# Patient Record
Sex: Female | Born: 1945
Health system: Southern US, Community
[De-identification: ages and names within clinical notes are randomized; demographics above are authoritative.]

## PROBLEM LIST (undated history)

## (undated) DIAGNOSIS — M810 Age-related osteoporosis without current pathological fracture: Secondary | ICD-10-CM

## (undated) DIAGNOSIS — G20 Parkinson's disease: Secondary | ICD-10-CM

## (undated) DIAGNOSIS — K254 Chronic or unspecified gastric ulcer with hemorrhage: Secondary | ICD-10-CM

## (undated) DIAGNOSIS — E559 Vitamin D deficiency, unspecified: Secondary | ICD-10-CM

## (undated) DIAGNOSIS — R259 Unspecified abnormal involuntary movements: Secondary | ICD-10-CM

## (undated) DIAGNOSIS — IMO0001 Reserved for inherently not codable concepts without codable children: Secondary | ICD-10-CM

## (undated) DIAGNOSIS — R9430 Abnormal result of cardiovascular function study, unspecified: Secondary | ICD-10-CM

## (undated) DIAGNOSIS — I2510 Atherosclerotic heart disease of native coronary artery without angina pectoris: Secondary | ICD-10-CM

## (undated) DIAGNOSIS — I5189 Other ill-defined heart diseases: Secondary | ICD-10-CM

## (undated) DIAGNOSIS — F03A0 Unspecified dementia, mild, without behavioral disturbance, psychotic disturbance, mood disturbance, and anxiety: Secondary | ICD-10-CM

## (undated) DIAGNOSIS — E041 Nontoxic single thyroid nodule: Secondary | ICD-10-CM

## (undated) DIAGNOSIS — G250 Essential tremor: Secondary | ICD-10-CM

## (undated) DIAGNOSIS — J189 Pneumonia, unspecified organism: Secondary | ICD-10-CM

## (undated) DIAGNOSIS — J329 Chronic sinusitis, unspecified: Secondary | ICD-10-CM

## (undated) DIAGNOSIS — K219 Gastro-esophageal reflux disease without esophagitis: Secondary | ICD-10-CM

## (undated) DIAGNOSIS — K449 Diaphragmatic hernia without obstruction or gangrene: Secondary | ICD-10-CM

## (undated) DIAGNOSIS — J339 Nasal polyp, unspecified: Secondary | ICD-10-CM

## (undated) DIAGNOSIS — M1990 Unspecified osteoarthritis, unspecified site: Secondary | ICD-10-CM

## (undated) DIAGNOSIS — E538 Deficiency of other specified B group vitamins: Secondary | ICD-10-CM

## (undated) DIAGNOSIS — R29818 Other symptoms and signs involving the nervous system: Secondary | ICD-10-CM

## (undated) DIAGNOSIS — I313 Pericardial effusion (noninflammatory): Secondary | ICD-10-CM

## (undated) DIAGNOSIS — J45909 Unspecified asthma, uncomplicated: Secondary | ICD-10-CM

## (undated) DIAGNOSIS — R112 Nausea with vomiting, unspecified: Secondary | ICD-10-CM

## (undated) DIAGNOSIS — Z9889 Other specified postprocedural states: Secondary | ICD-10-CM

## (undated) DIAGNOSIS — Z87442 Personal history of urinary calculi: Secondary | ICD-10-CM

## (undated) DIAGNOSIS — I3139 Other pericardial effusion (noninflammatory): Secondary | ICD-10-CM

## (undated) DIAGNOSIS — M8580 Other specified disorders of bone density and structure, unspecified site: Secondary | ICD-10-CM

## (undated) DIAGNOSIS — S3210XA Unspecified fracture of sacrum, initial encounter for closed fracture: Secondary | ICD-10-CM

## (undated) DIAGNOSIS — F32A Depression, unspecified: Secondary | ICD-10-CM

## (undated) DIAGNOSIS — E785 Hyperlipidemia, unspecified: Secondary | ICD-10-CM

## (undated) DIAGNOSIS — I10 Essential (primary) hypertension: Secondary | ICD-10-CM

## (undated) DIAGNOSIS — S322XXA Fracture of coccyx, initial encounter for closed fracture: Secondary | ICD-10-CM

## (undated) DIAGNOSIS — R001 Bradycardia, unspecified: Secondary | ICD-10-CM

## (undated) DIAGNOSIS — R002 Palpitations: Secondary | ICD-10-CM

## (undated) DIAGNOSIS — G4700 Insomnia, unspecified: Secondary | ICD-10-CM

## (undated) DIAGNOSIS — M545 Low back pain, unspecified: Secondary | ICD-10-CM

## (undated) DIAGNOSIS — F419 Anxiety disorder, unspecified: Secondary | ICD-10-CM

## (undated) DIAGNOSIS — F0390 Unspecified dementia without behavioral disturbance: Secondary | ICD-10-CM

## (undated) HISTORY — DX: Low back pain, unspecified: M54.50

## (undated) HISTORY — DX: Unspecified fracture of sacrum, initial encounter for closed fracture: S32.10XA

## (undated) HISTORY — DX: Palpitations: R00.2

## (undated) HISTORY — DX: Other specified disorders of bone density and structure, unspecified site: M85.80

## (undated) HISTORY — PX: ESOPHAGEAL DILATION: SHX303

## (undated) HISTORY — DX: Fracture of coccyx, initial encounter for closed fracture: S32.2XXA

## (undated) HISTORY — DX: Reserved for inherently not codable concepts without codable children: IMO0001

## (undated) HISTORY — DX: Unspecified dementia, mild, without behavioral disturbance, psychotic disturbance, mood disturbance, and anxiety: F03.A0

## (undated) HISTORY — DX: Chronic sinusitis, unspecified: J32.9

## (undated) HISTORY — PX: LUMBAR FUSION: SHX111

## (undated) HISTORY — DX: Parkinson's disease: G20

## (undated) HISTORY — DX: Pericardial effusion (noninflammatory): I31.3

## (undated) HISTORY — PX: EYE SURGERY: SHX253

## (undated) HISTORY — DX: Nasal polyp, unspecified: J33.9

## (undated) HISTORY — DX: Other pericardial effusion (noninflammatory): I31.39

## (undated) HISTORY — PX: CATARACT EXTRACTION W/ INTRAOCULAR LENS  IMPLANT, BILATERAL: SHX1307

## (undated) HISTORY — DX: Abnormal result of cardiovascular function study, unspecified: R94.30

## (undated) HISTORY — PX: TONSILLECTOMY: SUR1361

## (undated) HISTORY — DX: Low back pain: M54.5

## (undated) HISTORY — DX: Unspecified osteoarthritis, unspecified site: M19.90

## (undated) HISTORY — DX: Age-related osteoporosis without current pathological fracture: M81.0

## (undated) HISTORY — DX: Unspecified dementia without behavioral disturbance: F03.90

---

## 1974-12-12 HISTORY — PX: THYROIDECTOMY, PARTIAL: SHX18

## 1975-12-13 HISTORY — PX: TUBAL LIGATION: SHX77

## 1976-12-12 DIAGNOSIS — C539 Malignant neoplasm of cervix uteri, unspecified: Secondary | ICD-10-CM

## 1976-12-12 HISTORY — DX: Malignant neoplasm of cervix uteri, unspecified: C53.9

## 1976-12-12 HISTORY — PX: VAGINAL HYSTERECTOMY: SUR661

## 2000-10-27 ENCOUNTER — Encounter: Admission: RE | Admit: 2000-10-27 | Discharge: 2000-10-27 | Payer: Self-pay | Admitting: Family Medicine

## 2000-10-27 ENCOUNTER — Encounter: Payer: Self-pay | Admitting: Family Medicine

## 2001-12-12 HISTORY — PX: BLADDER SUSPENSION: SHX72

## 2001-12-17 ENCOUNTER — Encounter: Payer: Self-pay | Admitting: Family Medicine

## 2001-12-17 ENCOUNTER — Encounter: Admission: RE | Admit: 2001-12-17 | Discharge: 2001-12-17 | Payer: Self-pay | Admitting: Family Medicine

## 2001-12-26 ENCOUNTER — Observation Stay (HOSPITAL_COMMUNITY): Admission: RE | Admit: 2001-12-26 | Discharge: 2001-12-27 | Payer: Self-pay | Admitting: Urology

## 2002-08-06 ENCOUNTER — Encounter: Admission: RE | Admit: 2002-08-06 | Discharge: 2002-08-06 | Payer: Self-pay | Admitting: Family Medicine

## 2002-08-06 ENCOUNTER — Encounter: Payer: Self-pay | Admitting: Family Medicine

## 2003-05-29 ENCOUNTER — Encounter: Admission: RE | Admit: 2003-05-29 | Discharge: 2003-05-29 | Payer: Self-pay | Admitting: Family Medicine

## 2003-05-29 ENCOUNTER — Encounter: Payer: Self-pay | Admitting: Family Medicine

## 2005-05-16 ENCOUNTER — Encounter: Admission: RE | Admit: 2005-05-16 | Discharge: 2005-05-16 | Payer: Self-pay | Admitting: Family Medicine

## 2005-07-01 ENCOUNTER — Ambulatory Visit: Payer: Self-pay | Admitting: Cardiology

## 2005-07-12 ENCOUNTER — Ambulatory Visit: Payer: Self-pay

## 2005-07-12 ENCOUNTER — Ambulatory Visit: Payer: Self-pay | Admitting: Cardiology

## 2005-07-18 ENCOUNTER — Ambulatory Visit: Payer: Self-pay | Admitting: Cardiology

## 2006-07-28 ENCOUNTER — Encounter: Admission: RE | Admit: 2006-07-28 | Discharge: 2006-07-28 | Payer: Self-pay | Admitting: Family Medicine

## 2006-09-28 ENCOUNTER — Ambulatory Visit (HOSPITAL_COMMUNITY): Admission: RE | Admit: 2006-09-28 | Discharge: 2006-09-29 | Payer: Self-pay | Admitting: Orthopedic Surgery

## 2007-12-13 HISTORY — PX: OTHER SURGICAL HISTORY: SHX169

## 2009-08-06 ENCOUNTER — Ambulatory Visit: Payer: Self-pay | Admitting: Family Medicine

## 2010-04-28 ENCOUNTER — Other Ambulatory Visit: Payer: Self-pay | Admitting: General Practice

## 2010-04-30 ENCOUNTER — Ambulatory Visit: Payer: Self-pay | Admitting: General Practice

## 2010-07-21 ENCOUNTER — Encounter: Admission: RE | Admit: 2010-07-21 | Discharge: 2010-07-21 | Payer: Self-pay | Admitting: Neurosurgery

## 2010-08-12 HISTORY — PX: BACK SURGERY: SHX140

## 2010-09-07 ENCOUNTER — Inpatient Hospital Stay (HOSPITAL_COMMUNITY): Admission: RE | Admit: 2010-09-07 | Discharge: 2010-09-10 | Payer: Self-pay | Admitting: Neurosurgery

## 2010-12-12 HISTORY — PX: SHOULDER ARTHROSCOPY: SHX128

## 2011-02-24 LAB — COMPREHENSIVE METABOLIC PANEL
ALT: 20 U/L (ref 0–35)
AST: 26 U/L (ref 0–37)
Albumin: 3.7 g/dL (ref 3.5–5.2)
Alkaline Phosphatase: 114 U/L (ref 39–117)
BUN: 16 mg/dL (ref 6–23)
CO2: 30 mEq/L (ref 19–32)
Calcium: 10 mg/dL (ref 8.4–10.5)
Chloride: 105 mEq/L (ref 96–112)
Creatinine, Ser: 1.18 mg/dL (ref 0.4–1.2)
GFR calc Af Amer: 56 mL/min — ABNORMAL LOW (ref >60)
GFR calc non Af Amer: 46 mL/min — ABNORMAL LOW (ref >60)
Glucose, Bld: 102 mg/dL — ABNORMAL HIGH (ref 70–99)
Potassium: 4.7 mEq/L (ref 3.5–5.1)
Sodium: 141 mEq/L (ref 135–145)
Total Bilirubin: 0.4 mg/dL (ref 0.3–1.2)
Total Protein: 6.1 g/dL (ref 6.0–8.3)

## 2011-02-24 LAB — CBC
HCT: 42.1 % (ref 36.0–46.0)
Hemoglobin: 13.8 g/dL (ref 12.0–15.0)
MCH: 29.4 pg (ref 26.0–34.0)
MCHC: 32.8 g/dL (ref 30.0–36.0)
MCV: 89.6 fL (ref 78.0–100.0)
Platelets: 321 10*3/uL (ref 150–400)
RBC: 4.7 MIL/uL (ref 3.87–5.11)
RDW: 13.6 % (ref 11.5–15.5)
WBC: 11 10*3/uL — ABNORMAL HIGH (ref 4.0–10.5)

## 2011-02-24 LAB — URINALYSIS, ROUTINE W REFLEX MICROSCOPIC
Bilirubin Urine: NEGATIVE
Glucose, UA: NEGATIVE mg/dL
Hgb urine dipstick: NEGATIVE
Ketones, ur: NEGATIVE mg/dL
Nitrite: POSITIVE — AB
Protein, ur: NEGATIVE mg/dL
Specific Gravity, Urine: 1.023 (ref 1.005–1.030)
Urobilinogen, UA: 0.2 mg/dL (ref 0.0–1.0)
pH: 6 (ref 5.0–8.0)

## 2011-02-24 LAB — APTT: aPTT: 29 seconds (ref 24–37)

## 2011-02-24 LAB — DIFFERENTIAL
Basophils Absolute: 0.1 10*3/uL (ref 0.0–0.1)
Basophils Relative: 1 % (ref 0–1)
Eosinophils Absolute: 0.3 10*3/uL (ref 0.0–0.7)
Eosinophils Relative: 3 % (ref 0–5)
Lymphocytes Relative: 28 % (ref 12–46)
Lymphs Abs: 3.1 10*3/uL (ref 0.7–4.0)
Monocytes Absolute: 1.1 10*3/uL — ABNORMAL HIGH (ref 0.1–1.0)
Monocytes Relative: 10 % (ref 3–12)
Neutro Abs: 6.5 10*3/uL (ref 1.7–7.7)
Neutrophils Relative %: 59 % (ref 43–77)

## 2011-02-24 LAB — URINE MICROSCOPIC-ADD ON

## 2011-02-24 LAB — TYPE AND SCREEN
ABO/RH(D): O NEG
Antibody Screen: NEGATIVE

## 2011-02-24 LAB — PROTIME-INR
INR: 0.94 (ref 0.00–1.49)
Prothrombin Time: 12.8 seconds (ref 11.6–15.2)

## 2011-02-24 LAB — SURGICAL PCR SCREEN
MRSA, PCR: NEGATIVE
Staphylococcus aureus: NEGATIVE

## 2011-02-24 LAB — ABO/RH: ABO/RH(D): O NEG

## 2011-04-29 NOTE — Op Note (Signed)
[facility]  Patient:    Nancy Blackburn, Nancy Blackburn Visit Number: [id number] MRN: [id number]          Service Type: SUR Location: 1S 0002 01 Attending Physician:  Thermon Leyland Dictated by:   Barron Alvine, M.D. Proc. Date: 12/26/01 Admit Date:  12/26/2001   CC:         Nancy Blackburn, M.D., [facility], [place], [place]   Operative Report  PREOPERATIVE DIAGNOSES: 1. Grade 2 cystocele. 2. Stress urinary incontinence.  POSTOPERATIVE DIAGNOSES: 1. Grade 2 cystocele. 2. Stress urinary incontinence.  PROCEDURE: 1. Anterior repair. 2. Pubovaginal sling. 3. Flexible cystoscopy. 4. Suprapubic tube placement.  SURGEON:  Barron Alvine, M.D.  ANESTHESIA:  General.  INDICATIONS:  Nancy Blackburn is a 65 year old female. She has had a fairly long-standing and progressively worsening urinary incontinence. Her leakage is essentially that of a stress-based incontinence. She has some leakage without awareness but primarily leakage with stress events. She wears several thin pads per day. Objectively, she was noted to have a mild to moderate cystocele with urethral and bladder neck hypermobility and obvious objective incontinence. Her Nancy Blackburn test was positive. The patient underwent counseling with regard to treatment options and elected to have surgical repair. She appears to understand the risks and benefits as well as the success rates and typical limitations of the surgery. We talked about the possibility of prolonged urinary retention or other possibilities of voiding dysfunction as well as bleeding, infection, urinary tract injury, etc.  TECHNIQUE AND FINDINGS:  The patient was brought to the operating room where she had successful induction of general anesthesia. She was placed in mid lithotomy position and prepped and draped in the usual manner. A Foley catheter was inserted and the bladder was drained. A weighted vaginal speculum was utilized. The anterior  vaginal mucosa was infiltrated and an incision was made from the mid urethra back to near the scar of the area of the vaginal cuff. Vaginal incision was deepened down to the level of the bladder. The plane separating the vaginal wall and bladder were easily established. The retropubic spaces were entered fairly easily as well. Once the bladder was completely dissected free, a standard anterior repair was performed with approximately four horizontal mattress sutures of 2-0 Vicryl. This reduced the cystocele nicely. We then made a suprapubic incision which was carried down to the level of the anterior fascia. The sling itself was made with a piece of fascia lata Tutoplast, which was cut to approximately 2.5 x 8 cm. It was anchored at both ends with #1 nylon suture. With a finger in the right side of the retropubic space, we were able to pass a clamp behind the pubis symphysis and out the right side of the vaginal incision. The sutures were then grabbed and brought out the suprapubic incision. The same thing was done on the left side. The sling was then positioned at the proximal to mid urethra and pexed open with some 3-0 Vicryl suture. Cystoscopy revealed no evidence of bladder injury with blue dye seen from both ureteral orifices and a suprapubic tube was placed with direct visual guidance. The sling appeared to be well positioned. We then trimmed some anterior vaginal mucosa and closed the vaginal incision with a running 2-0 Vicryl suture. Vaginal packing was utilized. The suprapubic wound was infiltrated with Marcaine, and irrigated with antibiotic solution. The suprapubic sutures were then closed over approximately two fingers. Subcutaneous tissues were closed with some interrupted Vicryl and the skin was closed with clips. The  suprapubic tube was left to gravity drainage. Blood loss was about 200 cc. The patient appeared to tolerate the procedure well and there were no obvious  complications. Dictated by:   Barron Alvine, M.D. Attending Physician:  Thermon Leyland DD:  12/26/01 TD:  12/26/01 Job: [id number]

## 2011-04-29 NOTE — Op Note (Signed)
NAME:  Nancy Blackburn, Nancy Blackburn          ACCOUNT NO.:  [id number]   MEDICAL RECORD NO.:  [id number]          PATIENT TYPE:  AMB   LOCATION:  DAY                          FACILITY:  [facility]   PHYSICIAN:  Marlowe Kays, M.D.  DATE OF BIRTH:  07-Feb-1946   DATE OF PROCEDURE:  09/28/2006  DATE OF DISCHARGE:                                 OPERATIVE REPORT   PREOPERATIVE DIAGNOSIS:  1. Adhesive capsulitis.  2. Chronic impingement syndrome with rotator cuff tendinopathy.  3. Degenerative arthritis acromioclavicular joint,  right shoulder.   POSTOPERATIVE DIAGNOSIS:  1. Adhesive capsulitis.  2. Chronic impingement syndrome with rotator cuff tendinopathy.  3. Degenerative arthritis acromioclavicular joint,  right shoulder.   OPERATION:  1. Closed manipulation right shoulder.  2. Right shoulder arthroscopy (normal glenohumeral examination) and      arthroscopic subacromial decompression.  3. Open distal clavicle resection, right shoulder.   SURGEON:  Marlowe Kays, M.D.   ASSISTANTDruscilla Brownie. Underwood, P.A.-C.   ANESTHESIA:  General.   JUSTIFICATION FOR PROCEDURE:  She has had a painful right shoulder going  back to around June of this year. She also has a history adhesive capsulitis  in the left shoulder which was responded well to physical therapy. She was  not having degrees of motion and on x-ray, moderate degenerative arthritis  of the [facility] joint and a type 2 acromion with an MRI demonstrating rotator cuff  tendinopathy without any tear, what was described as a bulky AC arthritis.  Accordingly, she is here for the above mentioned surgical procedure.   DESCRIPTION OF PROCEDURE:  Prophylactic antibiotics, satisfactory general  anesthesia, beach chair position on the [place] frame, the right shoulder  girdle was prepped with DuraPrep and draped in a sterile field.  Prior to  the prep, however, I did perform a closed manipulation of her right  shoulder. Range of motion was markedly  improved just by the general  anesthetic indicating that pain was a factor in her loss of motion. I did  restore normal motion to her shoulder.  Following the prep, I injected the  proposed lateral and posterior portals as well as subacromial space and the  desired incision for the distal clavicle resection, all with 0.5% Marcaine  with adrenaline. Through a posterior soft spot portal, I atraumatically  entered the glenohumeral joint. There was some synovitis present and a good  bit of erythema throughout the joint consistent with either inflammatory or  closed manipulation changes.  However, I found no arthroscopically treatable  abnormalities with minimal perhaps injury to the under surface of the  rotator cuff.  I then redirected the scope in the subacromial space.  She  had a significant bursitis and impingement problem which I documented with  pictures.  I then cleaned up to the bursitis with a 4.2 shaver and then used  the 90 degrees ArthroCare vaporizer removing the synovium from the under  surface of the acromion and followed this with a 4-mm oval bur, burring down  the under surface of the acromion back to the [facility] joint.  I then alternated  back-and-forth between the bur, the vaporizer, and  the shaver until we had a  wide decompression which was documented, once again, with films with the arm  to her side and the arm abducted.  I then shaved the rotator cuff, checked  for bleeding, and removed all fluid possible from the subacromial space.  I  made an open incision down to the distal clavicle which I opened with  subperiosteal dissection using cutting cautery and periosteal elevator.  I  measured off 1.5 cm medial to the [facility] joint, placed some baby Homan's there,  and then used a microsaw to amputate the clavicle.  I removed the cut  portion with towel clip and cautery technique.  One small spicule of bone I  trimmed back to the parent clavicle and covered this with bone wax.  The   wound was then irrigated with sterile saline.  I placed Gelfoam to fill the  gap of the resection.  I then closed the wound over the top of it with  interrupted #1 Vicryl in the fascial covering, 2-0 Vicryl in the  subcutaneous tissue, and Steri-Strips on the skin, 4-0 nylon in the two  portals. Everything was very injected with 0.5% Marcaine with adrenaline.  Betadine and Adaptic were placed over the portals, a dry sterile dressing  over the wound, shoulder immobilizer.  She tolerated the procedure well and  was taken to the recovery room in satisfactory condition with no known  complications.           ______________________________  Marlowe Kays, M.D.     JA/MEDQ  D:  09/28/2006  T:  09/29/2006  Job:  [id number]

## 2011-08-05 ENCOUNTER — Ambulatory Visit: Payer: Self-pay | Admitting: Family Medicine

## 2011-09-08 ENCOUNTER — Other Ambulatory Visit: Payer: Self-pay | Admitting: Neurosurgery

## 2011-09-08 DIAGNOSIS — M549 Dorsalgia, unspecified: Secondary | ICD-10-CM

## 2011-09-23 ENCOUNTER — Ambulatory Visit
Admission: RE | Admit: 2011-09-23 | Discharge: 2011-09-23 | Disposition: A | Discharge status: Home / Self Care | Payer: Medicare Other | Source: Ambulatory Visit | Attending: Neurosurgery | Admitting: Neurosurgery

## 2011-09-23 DIAGNOSIS — M549 Dorsalgia, unspecified: Secondary | ICD-10-CM

## 2011-09-23 MED ORDER — GADOBENATE DIMEGLUMINE 529 MG/ML IV SOLN
17.0000 mL | Freq: Once | INTRAVENOUS | Status: AC | PRN
  Administered 2011-09-23: 17 mL via INTRAVENOUS

## 2011-09-29 ENCOUNTER — Other Ambulatory Visit: Payer: Self-pay | Admitting: Neurosurgery

## 2011-09-29 DIAGNOSIS — M545 Low back pain, unspecified: Secondary | ICD-10-CM

## 2011-10-04 ENCOUNTER — Ambulatory Visit
Admission: RE | Admit: 2011-10-04 | Discharge: 2011-10-04 | Disposition: A | Discharge status: Home / Self Care | Payer: Medicare Other | Source: Ambulatory Visit | Attending: Neurosurgery | Admitting: Neurosurgery

## 2011-10-04 DIAGNOSIS — M545 Low back pain, unspecified: Secondary | ICD-10-CM

## 2011-10-31 ENCOUNTER — Other Ambulatory Visit: Payer: Self-pay | Admitting: Neurosurgery

## 2011-11-11 ENCOUNTER — Encounter (HOSPITAL_COMMUNITY)
Admission: RE | Admit: 2011-11-11 | Discharge: 2011-11-11 | Disposition: A | Discharge status: Home / Self Care | Payer: Medicare Other | Source: Ambulatory Visit | Attending: Anesthesiology | Admitting: Anesthesiology

## 2011-11-11 ENCOUNTER — Other Ambulatory Visit: Payer: Self-pay

## 2011-11-11 ENCOUNTER — Encounter (HOSPITAL_COMMUNITY)
Admission: RE | Admit: 2011-11-11 | Discharge: 2011-11-11 | Disposition: A | Discharge status: Home / Self Care | Payer: Medicare Other | Source: Ambulatory Visit | Attending: Neurosurgery | Admitting: Neurosurgery

## 2011-11-11 ENCOUNTER — Encounter (HOSPITAL_COMMUNITY): Payer: Self-pay

## 2011-11-11 HISTORY — DX: Other specified postprocedural states: Z98.890

## 2011-11-11 HISTORY — DX: Nausea with vomiting, unspecified: R11.2

## 2011-11-11 HISTORY — DX: Essential (primary) hypertension: I10

## 2011-11-11 HISTORY — DX: Hyperlipidemia, unspecified: E78.5

## 2011-11-11 LAB — BASIC METABOLIC PANEL
BUN: 16 mg/dL (ref 6–23)
CO2: 32 mEq/L (ref 19–32)
Calcium: 10.2 mg/dL (ref 8.4–10.5)
Chloride: 104 mEq/L (ref 96–112)
Creatinine, Ser: 0.87 mg/dL (ref 0.50–1.10)
GFR calc Af Amer: 79 mL/min — ABNORMAL LOW (ref >90)
GFR calc non Af Amer: 68 mL/min — ABNORMAL LOW (ref >90)
Glucose, Bld: 107 mg/dL — ABNORMAL HIGH (ref 70–99)
Potassium: 3.9 mEq/L (ref 3.5–5.1)
Sodium: 142 mEq/L (ref 135–145)

## 2011-11-11 LAB — SURGICAL PCR SCREEN
MRSA, PCR: NEGATIVE
Staphylococcus aureus: POSITIVE — AB

## 2011-11-11 LAB — TYPE AND SCREEN
ABO/RH(D): O NEG
Antibody Screen: NEGATIVE

## 2011-11-11 LAB — CBC
HCT: 40.8 % (ref 36.0–46.0)
Hemoglobin: 14.1 g/dL (ref 12.0–15.0)
MCH: 30.1 pg (ref 26.0–34.0)
MCHC: 34.6 g/dL (ref 30.0–36.0)
MCV: 87 fL (ref 78.0–100.0)
Platelets: 246 10*3/uL (ref 150–400)
RBC: 4.69 MIL/uL (ref 3.87–5.11)
RDW: 12.5 % (ref 11.5–15.5)
WBC: 8.7 10*3/uL (ref 4.0–10.5)

## 2011-11-11 NOTE — Pre-Procedure Instructions (Signed)
20 Nancy Blackburn  11/11/2011   Your procedure is scheduled on:  Thurs,Dec 13 @ 3:45pm  Report to [facility] at 12:45 PM.  Call this number if you have problems the morning of surgery: [phone]   Remember:   Do not eat food:After Midnight.  May have clear liquids: up to 4 Hours before arrival.(until 8:45am)  Clear liquids include soda, tea, black coffee, apple or grape juice, broth.  Take these medicines the morning of surgery with A SIP OF WATER: Xanax,Pain Pill(if needed),Metoprolol   Do not wear jewelry, make-up or nail polish.  Do not wear lotions, powders, or perfumes. You may wear deodorant.  Do not shave 48 hours prior to surgery.  Do not bring valuables to the hospital.  Contacts, dentures or bridgework may not be worn into surgery.  Leave suitcase in the car. After surgery it may be brought to your room.  For patients admitted to the hospital, checkout time is 11:00 AM the day of discharge.   Patients discharged the day of surgery will not be allowed to drive home.  Name and phone number of your driver:   Special Instructions: CHG Shower Use Special Wash: 1/2 bottle night before surgery and 1/2 bottle morning of surgery.   Please read over the following fact sheets that you were given: Pain Booklet, Coughing and Deep Breathing, Blood Transfusion Information, MRSA Information and Surgical Site Infection Prevention

## 2011-11-11 NOTE — Progress Notes (Signed)
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

## 2011-11-11 NOTE — Progress Notes (Signed)
Pt doesn't have a cardiologist;managed by medical MD for HTN/Hyperlipidemia Dr.richard Sullivan Lone at [facility]

## 2011-11-23 MED ORDER — CEFAZOLIN SODIUM-DEXTROSE 2-3 GM-% IV SOLR
2.0000 g | INTRAVENOUS | Status: AC
  Administered 2011-11-24: 2 g via INTRAVENOUS
  Filled 2011-11-23: qty 50

## 2011-11-24 ENCOUNTER — Encounter (HOSPITAL_COMMUNITY): Payer: Self-pay | Admitting: Anesthesiology

## 2011-11-24 ENCOUNTER — Encounter (HOSPITAL_COMMUNITY)
Admission: RE | Disposition: A | Discharge status: Home / Self Care | Payer: Self-pay | Source: Ambulatory Visit | Attending: Neurosurgery

## 2011-11-24 ENCOUNTER — Encounter (HOSPITAL_COMMUNITY): Payer: Self-pay | Admitting: *Deleted

## 2011-11-24 ENCOUNTER — Inpatient Hospital Stay (HOSPITAL_COMMUNITY): Payer: Medicare Other | Admitting: Anesthesiology

## 2011-11-24 ENCOUNTER — Inpatient Hospital Stay (HOSPITAL_COMMUNITY)
Admission: RE | Admit: 2011-11-24 | Discharge: 2011-11-30 | DRG: 460 | Disposition: A | Discharge status: Home / Self Care | Payer: Medicare Other | Source: Ambulatory Visit | Attending: Neurosurgery | Admitting: Neurosurgery

## 2011-11-24 ENCOUNTER — Inpatient Hospital Stay (HOSPITAL_COMMUNITY): Payer: Medicare Other

## 2011-11-24 DIAGNOSIS — Z888 Allergy status to other drugs, medicaments and biological substances status: Secondary | ICD-10-CM

## 2011-11-24 DIAGNOSIS — M4316 Spondylolisthesis, lumbar region: Secondary | ICD-10-CM

## 2011-11-24 DIAGNOSIS — E785 Hyperlipidemia, unspecified: Secondary | ICD-10-CM | POA: Diagnosis present

## 2011-11-24 DIAGNOSIS — Z981 Arthrodesis status: Secondary | ICD-10-CM

## 2011-11-24 DIAGNOSIS — I10 Essential (primary) hypertension: Secondary | ICD-10-CM | POA: Diagnosis present

## 2011-11-24 DIAGNOSIS — Z79899 Other long term (current) drug therapy: Secondary | ICD-10-CM

## 2011-11-24 DIAGNOSIS — Z01812 Encounter for preprocedural laboratory examination: Secondary | ICD-10-CM

## 2011-11-24 DIAGNOSIS — Z01810 Encounter for preprocedural cardiovascular examination: Secondary | ICD-10-CM

## 2011-11-24 DIAGNOSIS — Z01818 Encounter for other preprocedural examination: Secondary | ICD-10-CM

## 2011-11-24 DIAGNOSIS — Q762 Congenital spondylolisthesis: Secondary | ICD-10-CM

## 2011-11-24 DIAGNOSIS — M48061 Spinal stenosis, lumbar region without neurogenic claudication: Principal | ICD-10-CM | POA: Diagnosis present

## 2011-11-24 DIAGNOSIS — IMO0002 Reserved for concepts with insufficient information to code with codable children: Secondary | ICD-10-CM | POA: Diagnosis present

## 2011-11-24 SURGERY — POSTERIOR LUMBAR FUSION 1 LEVEL
Anesthesia: General | Site: Back | Wound class: Clean

## 2011-11-24 MED ORDER — ONDANSETRON HCL 4 MG/2ML IJ SOLN: 4.0000 mg | INTRAMUSCULAR | Status: DC | PRN

## 2011-11-24 MED ORDER — BACITRACIN ZINC 500 UNIT/GM EX OINT
TOPICAL_OINTMENT | CUTANEOUS | Status: DC | PRN
  Administered 2011-11-24: 1 via TOPICAL

## 2011-11-24 MED ORDER — DIPHENHYDRAMINE HCL 25 MG PO TABS
25.0000 mg | ORAL_TABLET | Freq: Four times a day (QID) | ORAL | Status: DC | PRN
  Filled 2011-11-24: qty 1

## 2011-11-24 MED ORDER — SODIUM CHLORIDE 0.9 % IV SOLN
INTRAVENOUS | Status: AC
  Filled 2011-11-24: qty 500

## 2011-11-24 MED ORDER — HYDROCODONE-ACETAMINOPHEN 5-325 MG PO TABS
1.0000 | ORAL_TABLET | ORAL | Status: DC | PRN
  Administered 2011-11-24 – 2011-11-25 (×2): 2 via ORAL
  Filled 2011-11-24 (×2): qty 2

## 2011-11-24 MED ORDER — BUPIVACAINE LIPOSOME 1.3 % IJ SUSP
20.0000 mL | Freq: Once | INTRAMUSCULAR | Status: AC
  Administered 2011-11-24: 20 mL
  Filled 2011-11-24: qty 20

## 2011-11-24 MED ORDER — CEFAZOLIN SODIUM 1-5 GM-% IV SOLN
1.0000 g | Freq: Three times a day (TID) | INTRAVENOUS | Status: AC
  Administered 2011-11-24 – 2011-11-25 (×2): 1 g via INTRAVENOUS
  Filled 2011-11-24 (×2): qty 50

## 2011-11-24 MED ORDER — FENTANYL CITRATE 0.05 MG/ML IJ SOLN
INTRAMUSCULAR | Status: DC | PRN
  Administered 2011-11-24: 100 ug via INTRAVENOUS
  Administered 2011-11-24: 50 ug via INTRAVENOUS
  Administered 2011-11-24 (×2): 100 ug via INTRAVENOUS

## 2011-11-24 MED ORDER — HEMOSTATIC AGENTS (NO CHARGE) OPTIME
TOPICAL | Status: DC | PRN
  Administered 2011-11-24: 1 via TOPICAL

## 2011-11-24 MED ORDER — ACETAMINOPHEN 325 MG PO TABS
650.0000 mg | ORAL_TABLET | ORAL | Status: DC | PRN
  Administered 2011-11-26 – 2011-11-27 (×2): 650 mg via ORAL
  Filled 2011-11-24 (×2): qty 2

## 2011-11-24 MED ORDER — ALPRAZOLAM 0.5 MG PO TABS
0.5000 mg | ORAL_TABLET | Freq: Three times a day (TID) | ORAL | Status: DC
  Administered 2011-11-24 – 2011-11-29 (×15): 0.5 mg via ORAL
  Filled 2011-11-24 (×15): qty 1

## 2011-11-24 MED ORDER — ONDANSETRON HCL 4 MG/2ML IJ SOLN
INTRAMUSCULAR | Status: DC | PRN
  Administered 2011-11-24: 4 mg via INTRAVENOUS

## 2011-11-24 MED ORDER — DIAZEPAM 5 MG PO TABS
5.0000 mg | ORAL_TABLET | Freq: Four times a day (QID) | ORAL | Status: DC | PRN
  Administered 2011-11-25 (×2): 5 mg via ORAL
  Filled 2011-11-24 (×2): qty 1

## 2011-11-24 MED ORDER — OXYCODONE-ACETAMINOPHEN 5-325 MG PO TABS
1.0000 | ORAL_TABLET | ORAL | Status: DC | PRN
  Administered 2011-11-25 – 2011-11-27 (×6): 2 via ORAL
  Administered 2011-11-29 – 2011-11-30 (×3): 1 via ORAL
  Filled 2011-11-24 (×2): qty 2
  Filled 2011-11-24: qty 1
  Filled 2011-11-24 (×3): qty 2
  Filled 2011-11-24: qty 1
  Filled 2011-11-24: qty 2
  Filled 2011-11-24: qty 1

## 2011-11-24 MED ORDER — ZOLPIDEM TARTRATE 10 MG PO TABS: 10.0000 mg | ORAL_TABLET | Freq: Every evening | ORAL | Status: DC | PRN

## 2011-11-24 MED ORDER — HYDROMORPHONE HCL PF 1 MG/ML IJ SOLN
INTRAMUSCULAR | Status: AC
  Administered 2011-11-24: 0.5 mg via INTRAVENOUS
  Filled 2011-11-24: qty 1

## 2011-11-24 MED ORDER — SCOPOLAMINE 1 MG/3DAYS TD PT72
MEDICATED_PATCH | TRANSDERMAL | Status: DC | PRN
  Administered 2011-11-24: 1.5 mg via TRANSDERMAL

## 2011-11-24 MED ORDER — ACETAMINOPHEN 650 MG RE SUPP: 650.0000 mg | RECTAL | Status: DC | PRN

## 2011-11-24 MED ORDER — NEOSTIGMINE METHYLSULFATE 1 MG/ML IJ SOLN
INTRAMUSCULAR | Status: DC | PRN
  Administered 2011-11-24: 4 mg via INTRAVENOUS

## 2011-11-24 MED ORDER — MORPHINE SULFATE 4 MG/ML IJ SOLN
1.0000 mg | INTRAMUSCULAR | Status: DC | PRN
  Administered 2011-11-24 – 2011-11-25 (×2): 2 mg via INTRAVENOUS
  Administered 2011-11-25 – 2011-11-26 (×2): 4 mg via INTRAVENOUS
  Filled 2011-11-24 (×4): qty 1

## 2011-11-24 MED ORDER — LISINOPRIL 40 MG PO TABS
40.0000 mg | ORAL_TABLET | Freq: Every day | ORAL | Status: DC
  Administered 2011-11-25 – 2011-11-30 (×2): 40 mg via ORAL
  Filled 2011-11-24 (×6): qty 1

## 2011-11-24 MED ORDER — ROCURONIUM BROMIDE 100 MG/10ML IV SOLN
INTRAVENOUS | Status: DC | PRN
  Administered 2011-11-24: 10 mg via INTRAVENOUS
  Administered 2011-11-24: 50 mg via INTRAVENOUS
  Administered 2011-11-24: 10 mg via INTRAVENOUS

## 2011-11-24 MED ORDER — CITALOPRAM HYDROBROMIDE 20 MG PO TABS
20.0000 mg | ORAL_TABLET | ORAL | Status: DC
  Administered 2011-11-25 – 2011-11-30 (×6): 20 mg via ORAL
  Filled 2011-11-24 (×9): qty 1

## 2011-11-24 MED ORDER — ZOLPIDEM TARTRATE 5 MG PO TABS: 5.0000 mg | ORAL_TABLET | Freq: Every evening | ORAL | Status: DC | PRN

## 2011-11-24 MED ORDER — THROMBIN 20000 UNITS EX KIT
PACK | CUTANEOUS | Status: DC | PRN
  Administered 2011-11-24: 20000 [IU] via TOPICAL

## 2011-11-24 MED ORDER — ONDANSETRON HCL 4 MG/2ML IJ SOLN: 4.0000 mg | Freq: Once | INTRAMUSCULAR | Status: DC | PRN

## 2011-11-24 MED ORDER — MENTHOL 3 MG MT LOZG: 1.0000 | LOZENGE | OROMUCOSAL | Status: DC | PRN

## 2011-11-24 MED ORDER — BACITRACIN 50000 UNITS IM SOLR
INTRAMUSCULAR | Status: AC
  Filled 2011-11-24: qty 50000

## 2011-11-24 MED ORDER — SODIUM CHLORIDE 0.9 % IR SOLN
Status: DC | PRN
  Administered 2011-11-24: 12:00:00

## 2011-11-24 MED ORDER — PHENOL 1.4 % MT LIQD: 1.0000 | OROMUCOSAL | Status: DC | PRN

## 2011-11-24 MED ORDER — MEPERIDINE HCL 25 MG/ML IJ SOLN: 6.2500 mg | INTRAMUSCULAR | Status: DC | PRN

## 2011-11-24 MED ORDER — GLYCOPYRROLATE 0.2 MG/ML IJ SOLN
INTRAMUSCULAR | Status: DC | PRN
  Administered 2011-11-24: .6 mg via INTRAVENOUS

## 2011-11-24 MED ORDER — METOPROLOL TARTRATE 50 MG PO TABS
50.0000 mg | ORAL_TABLET | Freq: Two times a day (BID) | ORAL | Status: DC
  Administered 2011-11-24 – 2011-11-30 (×8): 50 mg via ORAL
  Filled 2011-11-24 (×13): qty 1

## 2011-11-24 MED ORDER — PROPOFOL 10 MG/ML IV EMUL
INTRAVENOUS | Status: DC | PRN
  Administered 2011-11-24: 90 mg via INTRAVENOUS
  Administered 2011-11-24: 110 mg via INTRAVENOUS
  Administered 2011-11-24: 20 mg via INTRAVENOUS
  Administered 2011-11-24: 40 mg via INTRAVENOUS

## 2011-11-24 MED ORDER — BUPIVACAINE-EPINEPHRINE PF 0.5-1:200000 % IJ SOLN
INTRAMUSCULAR | Status: DC | PRN
  Administered 2011-11-24: 30 mL

## 2011-11-24 MED ORDER — MIDAZOLAM HCL 5 MG/5ML IJ SOLN
INTRAMUSCULAR | Status: DC | PRN
  Administered 2011-11-24: 1 mg via INTRAVENOUS

## 2011-11-24 MED ORDER — LACTATED RINGERS IV SOLN
INTRAVENOUS | Status: DC
  Administered 2011-11-24: 20:00:00 via INTRAVENOUS

## 2011-11-24 MED ORDER — LACTATED RINGERS IV SOLN
INTRAVENOUS | Status: DC | PRN
  Administered 2011-11-24 (×3): via INTRAVENOUS

## 2011-11-24 MED ORDER — HYDROMORPHONE HCL PF 1 MG/ML IJ SOLN
0.2500 mg | INTRAMUSCULAR | Status: DC | PRN
  Administered 2011-11-24 (×5): 0.5 mg via INTRAVENOUS

## 2011-11-24 MED ORDER — PHENYLEPHRINE HCL 10 MG/ML IJ SOLN
INTRAMUSCULAR | Status: DC | PRN
  Administered 2011-11-24: 80 ug via INTRAVENOUS

## 2011-11-24 MED ORDER — SIMVASTATIN 40 MG PO TABS
40.0000 mg | ORAL_TABLET | Freq: Every day | ORAL | Status: DC
  Administered 2011-11-24 – 2011-11-29 (×6): 40 mg via ORAL
  Filled 2011-11-24 (×7): qty 1

## 2011-11-24 SURGICAL SUPPLY — 73 items
APL SKNCLS STERI-STRIP NONHPOA (GAUZE/BANDAGES/DRESSINGS) ×1
BAG DECANTER FOR FLEXI CONT (MISCELLANEOUS) ×2 IMPLANT
BENZOIN TINCTURE PRP APPL 2/3 (GAUZE/BANDAGES/DRESSINGS) ×2 IMPLANT
BLADE SURG ROTATE 9660 (MISCELLANEOUS) IMPLANT
BRUSH SCRUB EZ PLAIN DRY (MISCELLANEOUS) ×2 IMPLANT
BUR ACORN 6.0 (BURR) ×2 IMPLANT
BUR MATCHSTICK NEURO 3.0 LAGG (BURR) ×2 IMPLANT
CANISTER SUCTION 2500CC (MISCELLANEOUS) ×2 IMPLANT
CLOSURE STERI STRIP 1/2 X4 (GAUZE/BANDAGES/DRESSINGS) ×2 IMPLANT
CLOTH BEACON ORANGE TIMEOUT ST (SAFETY) ×2 IMPLANT
CONT SPEC 4OZ CLIKSEAL STRL BL (MISCELLANEOUS) ×6 IMPLANT
COVER BACK TABLE 24X17X13 BIG (DRAPES) IMPLANT
DRAPE C-ARM 42X72 X-RAY (DRAPES) ×4 IMPLANT
DRAPE LAPAROTOMY 100X72X124 (DRAPES) ×2 IMPLANT
DRAPE POUCH INSTRU U-SHP 10X18 (DRAPES) ×2 IMPLANT
DRAPE PROXIMA HALF (DRAPES) ×2 IMPLANT
DRAPE SURG 17X23 STRL (DRAPES) ×8 IMPLANT
ELECT BLADE 4.0 EZ CLEAN MEGAD (MISCELLANEOUS) ×2
ELECT REM PT RETURN 9FT ADLT (ELECTROSURGICAL) ×2
ELECTRODE BLDE 4.0 EZ CLN MEGD (MISCELLANEOUS) ×1 IMPLANT
ELECTRODE REM PT RTRN 9FT ADLT (ELECTROSURGICAL) ×1 IMPLANT
GAUZE SPONGE 4X4 16PLY XRAY LF (GAUZE/BANDAGES/DRESSINGS) ×2 IMPLANT
GLOVE BIO SURGEON STRL SZ8.5 (GLOVE) ×4 IMPLANT
GLOVE BIOGEL PI IND STRL 7.5 (GLOVE) ×2 IMPLANT
GLOVE BIOGEL PI INDICATOR 7.5 (GLOVE) ×2
GLOVE ECLIPSE 6.5 STRL STRAW (GLOVE) ×2 IMPLANT
GLOVE ECLIPSE 7.5 STRL STRAW (GLOVE) ×6 IMPLANT
GLOVE EXAM NITRILE LRG STRL (GLOVE) IMPLANT
GLOVE EXAM NITRILE MD LF STRL (GLOVE) ×4 IMPLANT
GLOVE EXAM NITRILE XL STR (GLOVE) IMPLANT
GLOVE EXAM NITRILE XS STR PU (GLOVE) IMPLANT
GLOVE INDICATOR 7.0 STRL GRN (GLOVE) ×6 IMPLANT
GLOVE OPTIFIT SS 7.5 STRL LX (GLOVE) ×6 IMPLANT
GLOVE SS BIOGEL STRL SZ 8 (GLOVE) ×2 IMPLANT
GLOVE SUPERSENSE BIOGEL SZ 8 (GLOVE) ×2
GOWN BRE IMP SLV AUR LG STRL (GOWN DISPOSABLE) ×10 IMPLANT
GOWN BRE IMP SLV AUR XL STRL (GOWN DISPOSABLE) ×4 IMPLANT
GOWN STRL REIN 2XL LVL4 (GOWN DISPOSABLE) IMPLANT
KIT BASIN OR (CUSTOM PROCEDURE TRAY) ×2 IMPLANT
KIT INFUSE X SMALL 1.4CC (Orthopedic Implant) ×2 IMPLANT
KIT ROOM TURNOVER OR (KITS) ×2 IMPLANT
NEEDLE HYPO 21X1.5 SAFETY (NEEDLE) IMPLANT
NEEDLE HYPO 22GX1.5 SAFETY (NEEDLE) ×2 IMPLANT
NS IRRIG 1000ML POUR BTL (IV SOLUTION) ×2 IMPLANT
Nuvasive Armada Screw Poly 7.5x50 ×2 IMPLANT
Nuvasive Armada Screw Poly 7.5x55 ×2 IMPLANT
Nuvasive Armada Screw Poly 8.5x50 ×2 IMPLANT
Nuvasive Armada Screw Poly 9.5 x 50 ×2 IMPLANT
Nuvasive Screw Poly 8.5x55 ×2 IMPLANT
PACK LAMINECTOMY NEURO (CUSTOM PROCEDURE TRAY) ×2 IMPLANT
PAD ARMBOARD 7.5X6 YLW CONV (MISCELLANEOUS) ×6 IMPLANT
PATTIES SURGICAL .5 X1 (DISPOSABLE) IMPLANT
PUTTY 10ML ACTIFUSE ABX (Putty) ×2 IMPLANT
ROD REBENT 30MM (Rod) ×2 IMPLANT
ROD REBENT 35MM (Rod) ×2 IMPLANT
SCREW LOCK (Screw) ×4 IMPLANT
SCREW LOCK 100X5.5X OPN (Screw) ×4 IMPLANT
SLEEVE SURGEON STRL (DRAPES) ×2 IMPLANT
SPONGE GAUZE 4X4 12PLY (GAUZE/BANDAGES/DRESSINGS) ×2 IMPLANT
SPONGE LAP 4X18 X RAY DECT (DISPOSABLE) IMPLANT
SPONGE NEURO XRAY DETECT 1X3 (DISPOSABLE) IMPLANT
SPONGE SURGIFOAM ABS GEL 100 (HEMOSTASIS) ×2 IMPLANT
STRIP CLOSURE SKIN 1/2X4 (GAUZE/BANDAGES/DRESSINGS) ×2 IMPLANT
SUT VIC AB 1 CT1 18XBRD ANBCTR (SUTURE) ×2 IMPLANT
SUT VIC AB 1 CT1 8-18 (SUTURE) ×4
SUT VIC AB 2-0 CP2 18 (SUTURE) ×4 IMPLANT
SYR 20CC LL (SYRINGE) IMPLANT
SYR 20ML ECCENTRIC (SYRINGE) ×2 IMPLANT
TAPE CLOTH SURG 4X10 WHT LF (GAUZE/BANDAGES/DRESSINGS) ×2 IMPLANT
TOWEL OR 17X24 6PK STRL BLUE (TOWEL DISPOSABLE) ×2 IMPLANT
TOWEL OR 17X26 10 PK STRL BLUE (TOWEL DISPOSABLE) ×2 IMPLANT
TRAY FOLEY CATH 14FRSI W/METER (CATHETERS) ×2 IMPLANT
WATER STERILE IRR 1000ML POUR (IV SOLUTION) ×2 IMPLANT

## 2011-11-24 NOTE — Progress Notes (Signed)
Subjective:  The patient is alert and pleasant. Her back is sore.  Objective: Vital signs in last 24 hours: Temp:  [97.7 F (36.5 C)] 97.7 F (36.5 C) (12/13 1430) Pulse Rate:  [60] 60  (12/13 0727) Resp:  [18] 18  (12/13 0727) BP: (121)/(77) 121/77 mmHg (12/13 0727) SpO2:  [96 %] 96 % (12/13 0727)  Intake/Output from previous day:   Intake/Output this shift: Total I/O In: 2300 [I.V.:2300] Out: 430 [Urine:230; Blood:200]  Physical exam patient is Glasgow Coma Scale 13. She is moving all 4 extremities well. Her dressing is clean and dry.  Lab Results: No results found for this basename: WBC:2,HGB:2,HCT:2,PLT:2 in the last 72 hours BMET No results found for this basename: NA:2,K:2,CL:2,CO2:2,GLUCOSE:2,BUN:2,CREATININE:2,CALCIUM:2 in the last 72 hours  Studies/Results: Dg Lumbar Spine 1 View  11/24/2011  *RADIOLOGY REPORT*  Clinical Data: Post L4 - L5 Review hardware fusion  LUMBAR SPINE - 1 VIEW  Comparison: Lumbar spine CT - 10/04/2011  Findings: A single lateral intraoperative radiograph of the lumbar spine is provided for review.  Surgical instruments and radiopaque sponge is seen posterior to the existing L4 - L5 spinal hardware. Post L4 - L5 intervertebral disc space replacement.  There is persistent approximately 1 cm anterolisthesis of L4 upon L5.  IMPRESSION: Intraoperative lumbar spine radiograph las above.  Original Report Authenticated By: Waynard Reeds, M.D.    Assessment/Plan: The patient is doing well.  LOS: 0 days     Claudine Stallings D 11/24/2011, 2:56 PM

## 2011-11-24 NOTE — Preoperative (Signed)
Beta Blockers   Reason not to administer Beta Blockers:Metoprolol 0545 11/24/2011

## 2011-11-24 NOTE — Anesthesia Preprocedure Evaluation (Addendum)
Anesthesia Evaluation  Patient identified by MRN, date of birth, ID band Patient awake    Reviewed: Allergy & Precautions, H&P , NPO status , Patient's Chart, lab work & pertinent test results, reviewed documented beta blocker date and time   History of Anesthesia Complications (+) PONV  Airway Mallampati: II TM Distance: >3 FB Neck ROM: Full    Dental  (+) Missing, Teeth Intact and Caps   Pulmonary          Cardiovascular hypertension,     Neuro/Psych    GI/Hepatic   Endo/Other    Renal/GU      Musculoskeletal   Abdominal   Peds  Hematology   Anesthesia Other Findings   Reproductive/Obstetrics                          Anesthesia Physical Anesthesia Plan  ASA: II  Anesthesia Plan: General   Post-op Pain Management:    Induction: Intravenous  Airway Management Planned: Oral ETT  Additional Equipment:   Intra-op Plan:   Post-operative Plan: Extubation in OR  Informed Consent: I have reviewed the patients History and Physical, chart, labs and discussed the procedure including the risks, benefits and alternatives for the proposed anesthesia with the patient or authorized representative who has indicated his/her understanding and acceptance.   Dental advisory given  Plan Discussed with: Anesthesiologist  Anesthesia Plan Comments:         Anesthesia Quick Evaluation

## 2011-11-24 NOTE — Op Note (Signed)
Brief history: The patient is a 65 year old white female who has previously undergone a PLIF by Dr. Alvino Chapel in September 2011. The patient said has had persistent back pain. She was further worked up with x-rays CAT scans MRIs etc. which demonstrated a likely pseudoarthrosis. I discussed the various treatment options with her including exploring her fusion and redo and fusion at L4-5. The patient has weighed the risks benefits and alternatives surgery decided proceed with the operation.  Preoperative diagnosis: L4-5 spondylolisthesis, facet arthropathy, degenerative disease, pseudoarthrosis, lumbago, lumbar radiculopathy  Postoperative diagnosis the same  Procedure: Bilateral L4 Laminotomy/foraminotomies to decompress the bilateral 04 and L5 nerve roots(the work required to do this was in addition to the work required to do the posterior lumbar interbody fusion because of the patient's spinal stenosis, facet arthropathy. Etc. requiring a wide decompression of the nerve roots.); posterior nonsegmental instrumentation from L4 to L5 with Nuvasive titanium pedicle screws and rods; posterior lateral arthrodesis at L4-5 with local morselized autograft bone, bone morphogenic-soaked collagen sponges, Actifuse bone graft extender   Surgeon: Dr. Delma Officer  Asst.: None  Anesthesia: Gen. endotracheal  Estimated blood loss: 150 cc  Drains: None  Locations: None  Description of procedure: The patient was brought to the operating room by the anesthesia team. General endotracheal anesthesia was induced. The patient was turned to the prone position on the [place] frame. The patient's lumbosacral region was then prepared with Betadine scrub and Betadine solution. Sterile drapes were applied.  I then injected the area to be incised with Marcaine with epinephrine solution. I then used the scalpel to make a linear midline incision over the L4-5 interspace. I then used electrocautery to perform a bilateral  subperiosteal dissection exposing the spinous process and lamina of L4 and L5. We then obtained intraoperative radiograph to confirm our location. We then inserted the Verstrac retractor to provide exposure.  I then explored arthrodesis. I removed the caps and rods between the pedicle screws. He was cleared at the L4 pedicle screw was very loose indicative of a pseudoarthrosis.  I began the decompression by using the high speed drill to perform laminotomies at L4 bilaterally. We did encounter some scar tissue at L4-5 on the right from the prior fusion. We carefully dissected from the dura and nerve roots and remove it with Kerrison punches.. We then used the Kerrison punches to widen the laminotomy and removed the ligamentum flavum at L4-5. We used the Kerrison punches to remove the medial facets at 45. We performed wide foraminotomies about the bilateral L4 and L5 nerve roots completing the decompression.  We now turned attention to the instrumentation. Under fluoroscopic guidance we cannulated the left L4 and L5 pedicles with the bone probe. We then removed the bone probe. He then tapped the pedicle with a 0.5 millimeter tap. We then removed the tap. We probed inside the tapped pedicle with a ball probe to rule out cortical breaches. We then inserted a 7.5 x 55 and 7.5 x 50 millimeter pedicle screw into the 4 and 5 pedicles on the left under fluoroscopic guidance. We then palpated along the medial aspect of the pedicles to rule out cortical breaches. There were none. The nerve roots were not injured. We used the old holes on the right to place 8.5 x 55 mm screws into the right L4 and L5 pedicle under fluoroscopic guidance. We then connected the unilateral pedicle screws with a lordotic rod. We secured the rod in place with the caps. We then tightened  the caps appropriately. This completed the instrumentation from L4 and 5.  We now turned our attention to the posterior lateral arthrodesis at L4-5. We used  the high-speed drill to decorticate the remainder of the facets, pars, transverse process at 4 and L5. We then applied a combination of local morselized autograft bone , bone morphogenic protein-soaked collagen sponges, and Actifuse bone graft extender over these decorticated posterior lateral structures. This completed the posterior lateral arthrodesis.  We then obtained hemostasis using bipolar electrocautery. We irrigated the wound out with bacitracin solution. We inspected the thecal sac and nerve roots and noted they were well decompressed. We then removed the retractor. We reapproximated patient's thoracolumbar fascia with interrupted #1 Vicryl suture. We reapproximated patient's subcutaneous tissue with interrupted 2-0 Vicryl suture. The reapproximated patient's skin with Steri-Strips and benzoin. The wound was then coated with bacitracin ointment. A sterile dressing was applied. The drapes were removed. The patient was subsequently returned to the supine position where they were extubated by the anesthesia team. He was then transported to the post anesthesia care unit in stable condition. All sponge instrument and needle counts were correct at the end of this case.

## 2011-11-24 NOTE — H&P (Signed)
Subjective: The patient is a 65 year old white female who underwent a L4-5 XLIF and unilateral percutaneous pedicle screws by Dr. Trey Sailors in September 2011. The patient has had persistent and worsening back pain. She has failed medical management. She was further worked up with a lumbar x-rays and a lumbar CT. These demonstrated a probable pseudoarthrosis. I discussed the various treatment options with the patient including surgery. The patient has decided proceed with the operation.   Past Medical History  Diagnosis Date  . PONV (postoperative nausea and vomiting)   . Hypertension     takes Metoprolol and Lisinopril  . Hyperlipidemia     takes Simvastatin    Past Surgical History  Procedure Date  . Tonsillectomy     as child  . Tubal ligation     25yrs ago  . Thyroidectomy, partial   . Vaginal hysterectomy     78yrs ago d/t cervical cancer  . Bladder suspension 2003  . Bone spur 2009    removed from right collar bone area and partial collar bone removed  . Back surgery 08/2010    Allergies  Allergen Reactions  . Biaxin Other (See Comments)    Upsets stomach    History  Substance Use Topics  . Smoking status: Not on file  . Smokeless tobacco: Not on file  . Alcohol Use:     Family History  Problem Relation Age of Onset  . Anesthesia problems Mother   . Hypotension Neg Hx   . Malignant hyperthermia Neg Hx   . Pseudochol deficiency Neg Hx    Prior to Admission medications   Medication Sig Start Date End Date Taking? Authorizing Provider  ALPRAZolam Prudy Feeler) 0.5 MG tablet Take 0.5 mg by mouth 3 (three) times daily as needed.     Yes Historical Provider, MD  BIOTIN PO Take 1 tablet by mouth daily.     Yes Historical Provider, MD  Calcium Carbonate-Vitamin D (CALCIUM + D PO) Take 1 tablet by mouth daily.     Yes Historical Provider, MD  cholecalciferol (VITAMIN D) 1000 UNITS tablet Take 1,000 Units by mouth daily.     Yes Historical Provider, MD  diphenhydrAMINE  (BENADRYL) 25 MG tablet Take 25 mg by mouth every 6 (six) hours as needed. For allergies     Yes Historical Provider, MD  fish oil-omega-3 fatty acids 1000 MG capsule Take 1 g by mouth daily.     Yes Historical Provider, MD  HYDROcodone-acetaminophen (NORCO) 10-325 MG per tablet Take 0.5-1 tablets by mouth every 4 (four) hours as needed. For pain    Yes Historical Provider, MD  lisinopril (PRINIVIL,ZESTRIL) 40 MG tablet Take 40 mg by mouth daily.     Yes Historical Provider, MD  metoprolol (LOPRESSOR) 50 MG tablet Take 50 mg by mouth 2 (two) times daily.     Yes Historical Provider, MD  naproxen (NAPROSYN) 500 MG tablet Take 500 mg by mouth 2 (two) times daily with a meal.     Yes Historical Provider, MD  simvastatin (ZOCOR) 40 MG tablet Take 40 mg by mouth at bedtime.     Yes Historical Provider, MD  citalopram (CELEXA) 20 MG tablet Take 20 mg by mouth every morning.      Historical Provider, MD     Review of Systems  Positive ROS: Negative except as above  All other systems have been reviewed and were otherwise negative with the exception of those mentioned in the HPI and as above.  Objective: Vital signs  in last 24 hours: Temp:  [97.7 F (36.5 C)] 97.7 F (36.5 C) (12/13 0727) Pulse Rate:  [60] 60  (12/13 0727) Resp:  [18] 18  (12/13 0727) BP: (121)/(77) 121/77 mmHg (12/13 0727) SpO2:  [96 %] 96 % (12/13 0727)  General Appearance: Alert, cooperative, no distress, appears stated age Head: Normocephalic, without obvious abnormality, atraumatic Eyes: PERRL, conjunctiva/corneas clear, EOM's intact, fundi benign, both eyes      Ears: Normal TM's and external ear canals, both ears Throat: Lips, mucosa, and tongue normal; teeth and gums normal Neck: Supple, symmetrical, trachea midline, no adenopathy; thyroid: No enlargement/tenderness/nodules; no carotid bruit or JVD Back: Symmetric, no curvature, ROM normal, no CVA tenderness, her paramedian lumbar incision is well-healed. Lungs:  Clear to auscultation bilaterally, respirations unlabored Heart: Regular rate and rhythm, S1 and S2 normal, no murmur, rub or gallop Abdomen: Soft, non-tender, bowel sounds active all four quadrants, no masses, no organomegaly Extremities: Extremities normal, atraumatic, no cyanosis or edema Pulses: 2+ and symmetric all extremities Skin: Skin color, texture, turgor normal, no rashes or lesions  NEUROLOGIC:   Mental status: alert and oriented, no aphasia, good attention span, Fund of knowledge/ memory ok Motor Exam - grossly normal Sensory Exam - grossly normal Reflexes:  Coordination - grossly normal Gait - grossly normal Balance - grossly normal Cranial Nerves: I: smell Not tested  II: visual acuity  OS: Normal    OD: Normal   II: visual fields Full to confrontation  II: pupils Equal, round, reactive to light  III,VII: ptosis None  III,IV,VI: extraocular muscles  Full ROM  V: mastication Normal  V: facial light touch sensation  Normal  V,VII: corneal reflex  Present  VII: facial muscle function - upper  Normal  VII: facial muscle function - lower Normal  VIII: hearing Not tested  IX: soft palate elevation  Normal  IX,X: gag reflex Present  XI: trapezius strength  5/5  XI: sternocleidomastoid strength 5/5  XI: neck flexion strength  5/5  XII: tongue strength  Normal    Data Review Lab Results  Component Value Date   WBC 8.7 11/11/2011   HGB 14.1 11/11/2011   HCT 40.8 11/11/2011   MCV 87.0 11/11/2011   PLT 246 11/11/2011   Lab Results  Component Value Date   NA 142 11/11/2011   K 3.9 11/11/2011   CL 104 11/11/2011   CO2 32 11/11/2011   BUN 16 11/11/2011   CREATININE 0.87 11/11/2011   GLUCOSE 107* 11/11/2011   Lab Results  Component Value Date   INR 0.94 09/02/2010   Imaging studies: I reviewed the patient's lumbar MRI performed at [facility] imaging on 09/23/2011. It demonstrates a spondylolisthesis at L4-5 with spinal stenosis. She has more mild degenerative  changes at L3-4.  Also reviewed the patient's lumbar CT performed at [facility] imaging 09/23/2011. It demonstrates haloing around the right L4 pedicle screw indicative of a pseudoarthrosis.  Assessment/Plan: L4-5 spondylolisthesis, pseudoarthrosis, spinal stenosis, lumbago, lumbar radiculopathy: I discussed situation with the patient. I reviewed her imaging studies with her and pointed out the abnormalities. We have discussed the various treatment options including surgery. I've explained surgical option of an exploration of her fusion, with L4-5 decompressive laminectomy, L4-5 posterior instrumentation and arthrodesis. I described the surgery to her. I discussed the risks, benefits, alternatives and likelihood of achieving our goals. I have answered all her questions. She wants to proceed with surgery.   Welborn Keena D 11/24/2011 9:45 AM

## 2011-11-24 NOTE — Transfer of Care (Signed)
Immediate Anesthesia Transfer of Care Note  Patient: FLOWER FRANKO  Procedure(s) Performed:  POSTERIOR LUMBAR FUSION 1 LEVEL - exploration of lumbar fusion with Lumbar Four-Five laminectomy poterior lumbar interbody fusion with interbody prothesis posterolateral arthrodesis and posterior nonsegmental instrumentation  Patient Location: PACU  Anesthesia Type: General  Level of Consciousness: awake, sedated, patient cooperative and responds to stimulation  Airway & Oxygen Therapy: Patient Spontanous Breathing and Patient connected to nasal cannula oxygen  Post-op Assessment: Report given to PACU RN, Post -op Vital signs reviewed and stable and Patient moving all extremities  Post vital signs: Reviewed and stable  Complications: No apparent anesthesia complications

## 2011-11-24 NOTE — Anesthesia Postprocedure Evaluation (Signed)
  Anesthesia Post-op Note  [name]  Procedure(s) Performed:  POSTERIOR LUMBAR FUSION 1 LEVEL - exploration of lumbar fusion with Lumbar Four-Five laminectomy poterior lumbar interbody fusion with interbody prothesis posterolateral arthrodesis and posterior nonsegmental instrumentation  Patient Location: PACU  Anesthesia Type: General  Level of Consciousness: awake, alert , oriented and patient cooperative  Airway and Oxygen Therapy: Patient Spontanous Breathing and Patient connected to nasal cannula oxygen  Post-op Pain: mild  Post-op Assessment: Post-op Vital signs reviewed, Patient's Cardiovascular Status Stable, Respiratory Function Stable, RESPIRATORY FUNCTION UNSTABLE, No signs of Nausea or vomiting and Pain level controlled  Post-op Vital Signs: stable  Complications: No apparent anesthesia complications

## 2011-11-25 LAB — BASIC METABOLIC PANEL
BUN: 7 mg/dL (ref 6–23)
CO2: 27 mEq/L (ref 19–32)
Calcium: 8.5 mg/dL (ref 8.4–10.5)
Chloride: 107 mEq/L (ref 96–112)
Creatinine, Ser: 0.77 mg/dL (ref 0.50–1.10)
GFR calc Af Amer: >90 mL/min (ref >90)
GFR calc non Af Amer: 86 mL/min — ABNORMAL LOW (ref >90)
Glucose, Bld: 125 mg/dL — ABNORMAL HIGH (ref 70–99)
Potassium: 3.8 mEq/L (ref 3.5–5.1)
Sodium: 140 mEq/L (ref 135–145)

## 2011-11-25 LAB — CBC
HCT: 36.3 % (ref 36.0–46.0)
Hemoglobin: 11.8 g/dL — ABNORMAL LOW (ref 12.0–15.0)
MCH: 28.6 pg (ref 26.0–34.0)
MCHC: 32.5 g/dL (ref 30.0–36.0)
MCV: 88.1 fL (ref 78.0–100.0)
Platelets: 190 10*3/uL (ref 150–400)
RBC: 4.12 MIL/uL (ref 3.87–5.11)
RDW: 12.6 % (ref 11.5–15.5)
WBC: 8.4 10*3/uL (ref 4.0–10.5)

## 2011-11-25 MED ORDER — MORPHINE SULFATE (PF) 1 MG/ML IV SOLN
INTRAVENOUS | Status: DC
  Administered 2011-11-25: 4.5 mg via INTRAVENOUS
  Administered 2011-11-25 (×2): 6 mg via INTRAVENOUS
  Administered 2011-11-25: 09:00:00 via INTRAVENOUS
  Administered 2011-11-26: 1.5 mL via INTRAVENOUS
  Administered 2011-11-26: 03:00:00 via INTRAVENOUS
  Administered 2011-11-26: 4.5 mL via INTRAVENOUS
  Administered 2011-11-26: 1.5 mL via INTRAVENOUS
  Administered 2011-11-26: 10.5 mg via INTRAVENOUS
  Filled 2011-11-25: qty 25

## 2011-11-25 MED ORDER — MORPHINE SULFATE (PF) 1 MG/ML IV SOLN
INTRAVENOUS | Status: AC
  Filled 2011-11-25: qty 25

## 2011-11-25 MED ORDER — DIPHENHYDRAMINE HCL 12.5 MG/5ML PO ELIX: 12.5000 mg | ORAL_SOLUTION | Freq: Four times a day (QID) | ORAL | Status: DC | PRN

## 2011-11-25 MED ORDER — DIPHENHYDRAMINE HCL 50 MG/ML IJ SOLN: 12.5000 mg | Freq: Four times a day (QID) | INTRAMUSCULAR | Status: DC | PRN

## 2011-11-25 MED ORDER — NALOXONE HCL 0.4 MG/ML IJ SOLN: 0.4000 mg | INTRAMUSCULAR | Status: DC | PRN

## 2011-11-25 MED ORDER — SODIUM CHLORIDE 0.9 % IJ SOLN: 9.0000 mL | INTRAMUSCULAR | Status: DC | PRN

## 2011-11-25 MED ORDER — ONDANSETRON HCL 4 MG/2ML IJ SOLN: 4.0000 mg | Freq: Four times a day (QID) | INTRAMUSCULAR | Status: DC | PRN

## 2011-11-25 NOTE — Progress Notes (Signed)
Patient ID: Nancy Blackburn, female   DOB: 1946/03/29, 65 y.o.   MRN: [id number] Subjective:  The patient is alert and pleasant. Her back is sore.  Objective: Vital signs in last 24 hours: Temp:  [97.3 F (36.3 C)-98.9 F (37.2 C)] 98.6 F (37 C) (12/14 0600) Pulse Rate:  [52-82] 80  (12/14 0600) Resp:  [7-19] 16  (12/14 0600) BP: (88-169)/(56-89) 140/70 mmHg (12/14 0600) SpO2:  [92 %-99 %] 98 % (12/14 0600) Weight:  [83.915 kg (185 lb)] 185 lb (83.915 kg) (12/13 2344)  Intake/Output from previous day: 12/13 0701 - 12/14 0700 In: 3125 [I.V.:3125] Out: 2630 [Urine:2430; Blood:200] Intake/Output this shift:    Physical exam the patient is alert and oriented. Her motor strength is 5 over 5 in her bilateral gastrocnemius and dorsiflexors. Her dressing is clean and dry.  Lab Results:  [facility] 11/25/11 0608  WBC 8.4  HGB 11.8*  HCT 36.3  PLT 190   BMET  Basename 11/25/11 0608  NA 140  K 3.8  CL 107  CO2 27  GLUCOSE 125*  BUN 7  CREATININE 0.77  CALCIUM 8.5    Studies/Results: Dg Lumbar Spine 2-3 Views  11/24/2011  *RADIOLOGY REPORT*  Clinical Data: Back pain  LUMBAR SPINE - 2-3 VIEW  Comparison: 10/04/2011  Findings: AP and lateral intraoperative C-arm films document revision of L4-5 posterolateral and interbody fusion.  IMPRESSION: As above  Original Report Authenticated By: Elsie Stain, M.D.   Dg Lumbar Spine 1 View  11/24/2011  *RADIOLOGY REPORT*  Clinical Data: Post L4 - L5 Review hardware fusion  LUMBAR SPINE - 1 VIEW  Comparison: Lumbar spine CT - 10/04/2011  Findings: A single lateral intraoperative radiograph of the lumbar spine is provided for review.  Surgical instruments and radiopaque sponge is seen posterior to the existing L4 - L5 spinal hardware. Post L4 - L5 intervertebral disc space replacement.  There is persistent approximately 1 cm anterolisthesis of L4 upon L5.  IMPRESSION: Intraoperative lumbar spine radiograph las above.  Original Report  Authenticated By: Waynard Reeds, M.D.   Dg C-arm 1-60 Min  11/24/2011  CLINICAL DATA: Lumbar 4-5 Fusion   C-ARM 1-60 MINUTES  Fluoroscopy was utilized by the requesting physician.  No radiographic  interpretation.      Assessment/Plan: Postop day 1: The patient is doing well. We will discontinue her Foley catheter. She will be mobilize with PT and OT. She will likely go home on Sunday. I have given her discharge instructions and answered all her questions.  LOS: 1 day     Kirstina Leinweber D 11/25/2011, 8:02 AM

## 2011-11-25 NOTE — Progress Notes (Signed)
Physical Therapy Evaluation Patient Details Name: Nancy Blackburn MRN: [id number] DOB: 1946-02-23 Today's Date: 11/25/2011  Problem List: There is no problem list on file for this patient.   Past Medical History:  Past Medical History  Diagnosis Date  . PONV (postoperative nausea and vomiting)   . Hypertension     takes Metoprolol and Lisinopril  . Hyperlipidemia     takes Simvastatin   Past Surgical History:  Past Surgical History  Procedure Date  . Tonsillectomy     as child  . Tubal ligation     18yrs ago  . Thyroidectomy, partial   . Vaginal hysterectomy     39yrs ago d/t cervical cancer  . Bladder suspension 2003  . Bone spur 2009    removed from right collar bone area and partial collar bone removed  . Back surgery 08/2010    PT Assessment/Plan/Recommendation PT Assessment Clinical Impression Statement: Pt s/p lumbar surgery with no prior PT after 2011 back surgery (per pt) and will benefit from PT to address the deficits below and maximize safety and Independence with mobility prior to d/c home with family assist PT Recommendation/Assessment: Patient will need skilled PT in the acute care venue PT Problem List: Decreased mobility;Decreased knowledge of use of DME;Decreased knowledge of precautions;Pain Barriers to Discharge: None PT Therapy Diagnosis : Difficulty walking;Acute pain PT Plan PT Frequency: Min 5X/week PT Treatment/Interventions: DME instruction;Gait training;Stair training;Functional mobility training;Therapeutic activities;Patient/family education PT Recommendation Follow Up Recommendations: None Equipment Recommended: None recommended by PT PT Goals  Acute Rehab PT Goals PT Goal Formulation: With patient Time For Goal Achievement: 7 days Pt will go Supine/Side to Sit: with supervision;with HOB 0 degrees PT Goal: Supine/Side to Sit - Progress: Other (comment) Pt will go Sit to Supine/Side: with supervision;with HOB 0 degrees PT Goal: Sit  to Supine/Side - Progress: Other (comment) Pt will go Sit to Stand: with supervision PT Goal: Sit to Stand - Progress: Other (comment) Pt will go Stand to Sit: with supervision PT Goal: Stand to Sit - Progress: Other (comment) Pt will Ambulate: >150 feet;with supervision;with least restrictive assistive device PT Goal: Ambulate - Progress: Other (comment) Pt will Go Up / Down Stairs: 3-5 stairs;with rail(s);with min assist PT Goal: Up/Down Stairs - Progress: Other (comment) Additional Goals Additional Goal #1: Pt will verbalize and demonstrate adherence to back precautions during functional mobility. PT Goal: Additional Goal #1 - Progress: Other (comment)  PT Evaluation Precautions/Restrictions  Precautions Precautions: Back Precaution Booklet Issued: Yes (comment) Precaution Comments: Pt able to recall 3/3 back precautions independently upon request Required Braces or Orthoses: Yes Spinal Brace: Lumbar corset;Applied in sitting position Restrictions Weight Bearing Restrictions: No Prior Functioning  Home Living Lives With: Other (Comment) (grandson) Receives Help From: Family (daughter to stay with her) Type of Home: House Home Layout: One level Home Access: Stairs to enter Entrance Stairs-Rails: Can reach both Entrance Stairs-Number of Steps: 4 Bathroom Shower/Tub: Forensic scientist: Standard Bathroom Accessibility: Yes How Accessible: Accessible via walker Home Adaptive Equipment: Bedside commode/3-in-1;Hand-held shower hose;Reacher;Walker - rolling;Tub transfer bench Prior Function Level of Independence: Independent with basic ADLs;Independent with homemaking with ambulation Driving: Yes Vocation: Retired Financial risk analyst Arousal/Alertness: Awake/alert Overall Cognitive Status: Appears within functional limits for tasks assessed Orientation Level: Oriented X4 Sensation/Coordination Sensation Light Touch: Appears Intact Coordination Gross  Motor Movements are Fluid and Coordinated: Yes Fine Motor Movements are Fluid and Coordinated: Yes Extremity Assessment RUE Assessment RUE Assessment: Within Functional Limits (MMT 3+/5) LUE Assessment LUE Assessment: Within  Functional Limits (MMT 3+/5) RLE Assessment RLE Assessment: Within Functional Limits LLE Assessment LLE Assessment: Within Functional Limits Mobility (including Balance) Bed Mobility Bed Mobility: Yes Rolling Right: 5: Supervision;With rail Rolling Right Details (indicate cue type and reason): vc for technique to maintain back precautions (prevent twisting) Right Sidelying to Sit: 5: Supervision;With rails Right Sidelying to Sit Details (indicate cue type and reason): vc for technique (to minimize twisting) Sit to Supine - Right: 4: Min assist Sit to Supine - Left: 4: Min assist Transfers Sit to Stand: 4: Min assist;With upper extremity assist;From bed;From chair/3-in-1 Sit to Stand Details (indicate cue type and reason): vc for safe use of RW/hand placement Stand to Sit: 5: Supervision;With upper extremity assist;To bed;To chair/3-in-1 Stand to Sit Details: vc for safe use of RW/hand placement Ambulation/Gait Ambulation/Gait: Yes Ambulation/Gait Assistance: 4: Min assist Ambulation/Gait Assistance Details (indicate cue type and reason): min-guard assist; multiple cues to prevent twisting (when talking, when approaching sink to wash hands, when turning with RW) Ambulation Distance (Feet): 200 Feet Assistive device: Rolling walker Gait Pattern: Within Functional Limits Gait velocity: decreased due to pain and feeling unsteady due to meds  Posture/Postural Control Posture/Postural Control: No significant limitations Balance Balance Assessed: No Exercise    End of Session PT - End of Session Equipment Utilized During Treatment: Gait belt;Back brace Activity Tolerance: Patient tolerated treatment well Patient left: with call bell in reach;with family/visitor  present;Other (comment) (sitting EOB preparing to do sponge bath) Nurse Communication: Mobility status for ambulation General Behavior During Session: [facility] for tasks performed Cognition: [facility] for tasks performed  Dalena Plantz 11/25/2011, 4:52 PM Pager [phone]

## 2011-11-25 NOTE — Plan of Care (Signed)
Problem: Consults Goal: Diagnosis - Spinal Surgery Outcome: Completed/Met Date Met:  11/25/11 [place]/Lumbar Spine Fusion     

## 2011-11-25 NOTE — Plan of Care (Signed)
Problem: Phase II Progression Outcomes Goal: Discharge plan established Pt has necessary DME at home and will have family to assist her after acute discharge

## 2011-11-25 NOTE — Progress Notes (Signed)
Occupational Therapy Evaluation Patient Details Name: Nancy Blackburn MRN: [id number] DOB: Nov 08, 1946 Today's Date: 11/25/2011  Problem List: There is no problem list on file for this patient.   Past Medical History:  Past Medical History  Diagnosis Date  . PONV (postoperative nausea and vomiting)   . Hypertension     takes Metoprolol and Lisinopril  . Hyperlipidemia     takes Simvastatin   Past Surgical History:  Past Surgical History  Procedure Date  . Tonsillectomy     as child  . Tubal ligation     48yrs ago  . Thyroidectomy, partial   . Vaginal hysterectomy     38yrs ago d/t cervical cancer  . Bladder suspension 2003  . Bone spur 2009    removed from right collar bone area and partial collar bone removed  . Back surgery 08/2010    OT Assessment/Plan/Recommendation OT Assessment Clinical Impression Statement: Pt demos decline in function with strength, balance, activity tolerance ans safety with ADLs and ADL mobility. Pt would benefit from skilled OT services to address these impairments to increase independence and maximize level of function to returnh ome safely OT Recommendation/Assessment: Patient will need skilled OT in the acute care venue OT Problem List: Decreased strength;Decreased activity tolerance;Pain;Impaired balance (sitting and/or standing) Barriers to Discharge: None OT Therapy Diagnosis : Generalized weakness OT Plan OT Frequency: Min 2X/week OT Treatment/Interventions: Self-care/ADL training;Energy conservation;Therapeutic exercise;DME and/or AE instruction;Therapeutic activities;Neuromuscular education;Patient/family education;Balance training OT Recommendation Follow Up Recommendations: Home health OT Equipment Recommended: Other (comment) (Pt has necessary DME at home, may need ADL A/E kit) Individuals Consulted Consulted and Agree with Results and Recommendations: Patient;Family member/caregiver Family Member Consulted: Pt's daughter OT  Goals Acute Rehab OT Goals OT Goal Formulation: With patient Time For Goal Achievement: 7 days ADL Goals Pt Will Perform Grooming: with set-up;Standing at sink Pt Will Perform Upper Body Bathing: with set-up;with supervision;Sitting in shower;Sitting, edge of bed Pt Will Perform Lower Body Bathing: with mod assist;Sitting in shower;Sitting, edge of bed;with adaptive equipment Pt Will Perform Upper Body Dressing: with set-up;with supervision;Sitting, bed Pt Will Perform Lower Body Dressing: with mod assist;with adaptive equipment Pt Will Transfer to Toilet: with supervision;Grab bars;with DME Pt Will Perform Toileting - Clothing Manipulation: with supervision;Standing Pt Will Perform Toileting - Hygiene: with supervision;Sit to stand from 3-in-1/toilet Pt Will Perform Tub/Shower Transfer: with supervision;with DME;Grab bars  OT Evaluation Precautions/Restrictions  Precautions Precautions: Back Precaution Comments: Pt able to recall 3/3 back precautions independently upon request Required Braces or Orthoses: Yes Spinal Brace: Applied in sitting position;Lumbar corset Restrictions Weight Bearing Restrictions: No Prior Functioning Home Living Lives With: Other (Comment) (grandson) Receives Help From: Family Type of Home: House Home Layout: One level Home Access: Stairs to enter Entrance Stairs-Rails: Can reach both Entrance Stairs-Number of Steps: 4 Bathroom Shower/Tub: Forensic scientist: Standard Bathroom Accessibility: Yes How Accessible: Accessible via walker Home Adaptive Equipment: Bedside commode/3-in-1;Hand-held shower hose;Reacher;Walker - rolling;Tub transfer bench Prior Function Level of Independence: Independent with basic ADLs;Independent with homemaking with ambulation Driving: Yes Vocation: Retired ADL ADL Eating/Feeding: Not assessed Grooming: Performed;Wash/dry hands;Wash/dry face;Brushing hair;Other (comment) (Min guard assist) Upper  Body Bathing: Simulated;Minimal assistance Where Assessed - Upper Body Bathing: Standing at sink;Sitting, chair Lower Body Bathing: Maximal assistance Where Assessed - Lower Body Bathing: Sitting, chair;Standing at sink Upper Body Dressing: Performed;Minimal assistance Lower Body Dressing: +1 Total assistance Where Assessed - Lower Body Dressing: Sitting, chair Toilet Transfer: Performed;Minimal assistance Toilet Transfer Method: Ambulating Toilet Transfer Equipment: Bedside commode Toileting -  Clothing Manipulation: Minimal assistance Where Assessed - Toileting Clothing Manipulation: Standing Toileting - Hygiene: Performed (Min guard assist) Where Assessed - Toileting Hygiene: Sit to stand from 3-in-1 or toilet;Sit on 3-in-1 or toilet Tub/Shower Transfer: Not assessed Vision/Perception  Vision - History Baseline Vision: Wears glasses all the time Patient Visual Report: No change from baseline Perception Perception: Within Functional Limits Cognition Cognition Arousal/Alertness: Awake/alert Overall Cognitive Status: Appears within functional limits for tasks assessed Orientation Level: Oriented X4 Sensation/Coordination Sensation Light Touch: Appears Intact Coordination Gross Motor Movements are Fluid and Coordinated: Yes Fine Motor Movements are Fluid and Coordinated: Yes Extremity Assessment RUE Assessment RUE Assessment: Within Functional Limits (MMT 3+/5) LUE Assessment LUE Assessment: Within Functional Limits (MMT 3+/5) Mobility  Bed Mobility Bed Mobility: Yes Sit to Supine - Right: 4: Min assist Sit to Supine - Left: 4: Min assist Transfers Transfers: Yes Sit to Stand: 4: Min assist;From chair/3-in-1;From toilet;With armrests Stand to Sit: 4: Min assist;To toilet;To bed   End of Session OT - End of Session Equipment Utilized During Treatment: Gait belt;Back brace Activity Tolerance: Patient limited by pain Patient left: in bed;with call bell in reach;with  family/visitor present Nurse Communication: Mobility status for transfers;Mobility status for ambulation General Behavior During Session: [facility] for tasks performed Cognition: [facility] for tasks performed   Galen Manila 11/25/2011, 1:30 PM

## 2011-11-26 ENCOUNTER — Inpatient Hospital Stay (HOSPITAL_COMMUNITY): Payer: Medicare Other

## 2011-11-26 LAB — CBC
HCT: 32.2 % — ABNORMAL LOW (ref 36.0–46.0)
Hemoglobin: 10.6 g/dL — ABNORMAL LOW (ref 12.0–15.0)
MCH: 28.7 pg (ref 26.0–34.0)
MCHC: 32.9 g/dL (ref 30.0–36.0)
MCV: 87.3 fL (ref 78.0–100.0)
Platelets: 174 10*3/uL (ref 150–400)
RBC: 3.69 MIL/uL — ABNORMAL LOW (ref 3.87–5.11)
RDW: 12.6 % (ref 11.5–15.5)
WBC: 10.4 10*3/uL (ref 4.0–10.5)

## 2011-11-26 MED ORDER — DIAZEPAM 5 MG PO TABS
5.0000 mg | ORAL_TABLET | Freq: Four times a day (QID) | ORAL | Status: DC | PRN
  Administered 2011-11-26: 10 mg via ORAL
  Filled 2011-11-26: qty 2

## 2011-11-26 NOTE — Progress Notes (Signed)
Pt temperature elevated 102.2, Dr. Venetia Maxon notified, new orders received. Bernie Covey, RN

## 2011-11-26 NOTE — Progress Notes (Signed)
Pt given incentive spirometer, pt instructed on how to use incentive spirometer and encouraged to use frequently. Pt states that she understands and return demonstrated. Pt reached 1750 - 2000 on incentive spirometer. Bernie Covey, RN

## 2011-11-26 NOTE — Progress Notes (Signed)
Postop day 2.  Afebrile with stable vitals.  Patient complains of moderate back pain. No true radicular pain. No complaints of weakness or sensory loss. Overall feels that she is making good progress.  On exam she is awake and alert oriented appropriate. Motor and sensory function extremities normal. Wound is healing well. Dressing dry. The abdominal examination is benign.  Progressing well following lumbar fusion surgery. Continue efforts at mobilization. Probable discharge home tomorrow. We'll stop PCA today.

## 2011-11-26 NOTE — Progress Notes (Signed)
Physical Therapy Treatment Patient Details Name: Nancy Blackburn MRN: [id number] DOB: [birth year] Today's Date: 11/26/2011  PT Assessment/Plan  PT - Assessment/Plan Comments on Treatment Session:  increased activity today with increased ambulation distance and up/down stairs. unable to ambulate back to room from PT gym due to increased pain and complaints of nausea from pain. RN notified and to monitor pt's nausea and pain. pt did use PCA x2 with therapy for pain control.         PT Plan: Discharge plan remains appropriate PT Frequency: Min 5X/week Follow Up Recommendations: None Equipment Recommended: None recommended by PT PT Goals  Acute Rehab PT Goals PT Goal: Supine/Side to Sit - Progress: Progressing toward goal PT Goal: Sit to Supine/Side - Progress: Progressing toward goal PT Goal: Sit to Stand - Progress: Progressing toward goal PT Goal: Stand to Sit - Progress: Progressing toward goal PT Goal: Ambulate - Progress: Progressing toward goal PT Goal: Up/Down Stairs - Progress: Progressing toward goal Additional Goals PT Goal: Additional Goal #1 - Progress: Progressing toward goal  PT Treatment Precautions/Restrictions  Precautions Precautions: Back Precaution Booklet Issued: Yes (comment) Precaution Comments: pt able to recall 3/3 back precautions with out assit. req's mod cues to demo with activity. Required Braces or Orthoses: Yes Spinal Brace: Lumbar corset;Applied in sitting position (min assist with cues to don correctly. friend educated also) Restrictions Weight Bearing Restrictions: No Mobility (including Balance) Bed Mobility Bed Mobility: Yes Right Sidelying to Sit: 5: Supervision;HOB flat Right Sidelying to Sit Details (indicate cue type and reason): no rails today. cues to avoid twisting with elevation of trunk from lying to sitting on edge of bed. Sitting - Scoot to Edge of Bed: 5: Supervision Sitting - Scoot to [place] of Bed Details (indicate cue type and  reason): cues to avoid twisting with scooting (pt attemting to do reciprocal scooting). cues to use arms to push both hips forward together with ant wt shifting as well. Transfers Transfers: Yes Sit to Stand: 4: Min assist;From bed;With upper extremity assist;From chair/3-in-1 Sit to Stand Details (indicate cue type and reason): cues for safe hand placement, to shift wt anteriorly over legs and to "power through" legs to stand up. (from both the bed and the wheelchair) Stand to Sit: 4: Min assist;To bed;With upper extremity assist Stand to Sit Details: cues to use arms to control descent with sitting down. (to both the wheelchair and bed). Stand Pivot Transfers: 4: Min assist;With armrests Stand Pivot Transfer Details (indicate cue type and reason): stand piviot with bilateral HHA from wheelchair to bed. cues to stand up tall and keep advance feet toward the bed. Ambulation/Gait Ambulation/Gait Assistance: 4: Min assist;5: Supervision Ambulation/Gait Assistance Details (indicate cue type and reason): multiple cues with gait to not twist with distractions. pt with increased right LE pain with gait and stairs, req'd wheelchair assist back to room. needed increased assistance with gait as pain increased with increased activity. Ambulation Distance (Feet): 160 Feet Assistive device: Rolling walker Gait Pattern: Step-through pattern;Decreased stride length;Antalgic;Decreased stance time - right (antalgic gait pattern as gait progressed) Gait velocity: decreased. Stairs: Yes Stairs Assistance: 4: Min assist Stairs Assistance Details (indicate cue type and reason): cues for sequency and to avoid twisting while ascending the stairs. Stair Management Technique: Two rails Number of Stairs: 2   Posture/Postural Control Posture/Postural Control: No significant limitations Exercise    End of Session PT - End of Session Equipment Utilized During Treatment: Back brace Activity Tolerance: Patient  tolerated treatment well Patient left:  in bed;with call bell in reach;with family/visitor present Nurse Communication: Mobility status for transfers;Mobility status for ambulation General Behavior During Session: [facility] for tasks performed Cognition: [facility] for tasks performed  Sallyanne Kuster 11/26/2011, 10:26 AM   Sallyanne Kuster, PTA Office- [phone]

## 2011-11-27 LAB — URINALYSIS, ROUTINE W REFLEX MICROSCOPIC
Bilirubin Urine: NEGATIVE
Glucose, UA: NEGATIVE mg/dL
Hgb urine dipstick: NEGATIVE
Ketones, ur: NEGATIVE mg/dL
Leukocytes, UA: NEGATIVE
Nitrite: NEGATIVE
Protein, ur: NEGATIVE mg/dL
Specific Gravity, Urine: 1.014 (ref 1.005–1.030)
Urobilinogen, UA: 2 mg/dL — ABNORMAL HIGH (ref 0.0–1.0)
pH: 6 (ref 5.0–8.0)

## 2011-11-27 MED ORDER — HYDROCODONE-ACETAMINOPHEN 10-325 MG PO TABS
1.0000 | ORAL_TABLET | ORAL | Status: DC | PRN
  Administered 2011-11-28: 2 via ORAL
  Filled 2011-11-27: qty 2

## 2011-11-27 MED ORDER — DIAZEPAM 5 MG PO TABS
5.0000 mg | ORAL_TABLET | Freq: Four times a day (QID) | ORAL | Status: DC | PRN
  Administered 2011-11-28 – 2011-11-30 (×6): 5 mg via ORAL
  Filled 2011-11-27 (×6): qty 1

## 2011-11-27 MED ORDER — HYDROMORPHONE HCL 2 MG PO TABS
4.0000 mg | ORAL_TABLET | ORAL | Status: DC | PRN
  Administered 2011-11-27 – 2011-11-28 (×7): 4 mg via ORAL
  Administered 2011-11-29: 2 mg via ORAL
  Administered 2011-11-29: 4 mg via ORAL
  Administered 2011-11-29: 2 mg via ORAL
  Administered 2011-11-29 – 2011-11-30 (×4): 4 mg via ORAL
  Filled 2011-11-27 (×3): qty 2
  Filled 2011-11-27: qty 1
  Filled 2011-11-27: qty 2
  Filled 2011-11-27: qty 1
  Filled 2011-11-27 (×7): qty 2
  Filled 2011-11-27 (×2): qty 1

## 2011-11-27 NOTE — Progress Notes (Addendum)
Subjective: Having lots of pain into right leg.  Quite anxious.  Objective: Vital signs in last 24 hours: Temp:  [98.3 F (36.8 C)-102.2 F (39 C)] 98.3 F (36.8 C) (12/16 0607) Pulse Rate:  [72-104] 80  (12/16 0607) Resp:  [18-20] 18  (12/16 0607) BP: (104-139)/(46-70) 107/63 mmHg (12/16 0607) SpO2:  [91 %-98 %] 95 % (12/16 0607) Weight change:     Intake/Output from previous day: 12/15 0701 - 12/16 0700 In: 480 [P.O.:480] Out: -  Last cbgs: CBG (last 3)  No results found for this basename: GLUCAP:3 in the last 72 hours   Physical Exam  Limited mobility with PT.  Has full strength in legs.    Lab Results:  [facility] 11/26/11 2233 11/25/11 0608  WBC 10.4 8.4  HGB 10.6* 11.8*  HCT 32.2* 36.3  PLT 174 190   BMET  Basename 11/25/11 0608  NA 140  K 3.8  CL 107  CO2 27  GLUCOSE 125*  BUN 7  CREATININE 0.77  CALCIUM 8.5    Studies/Results: Dg Chest Port 1 View  11/27/2011  *RADIOLOGY REPORT*  Clinical Data: Fever.  Postop  PORTABLE CHEST - 1 VIEW  Comparison: 11/11/2011  Findings: No eventration of the right hemidiaphragm is unchanged. Negative for pneumonia.  Negative for heart failure or effusion.  IMPRESSION: No active cardiopulmonary disease.  Original Report Authenticated By: Camelia Phenes, M.D.    Medications: I have reviewed the patient's current medications.  Assessment/Plan: Continue PT.  Will increase pain medication strength and add diazepam for muscular spasm.  Had fever last night.  Workup negative.  I have encouraged patient to use incentive spirometer.  Dorian Heckle , MD 11/27/2011, 6:38 AM

## 2011-11-27 NOTE — Progress Notes (Signed)
Physical Therapy Treatment Patient Details Name: Nancy Blackburn MRN: [id number] DOB: 22-Oct-1946 Today's Date: 11/27/2011  PT Assessment/Plan  PT - Assessment/Plan Comments on Treatment Session: Pt was limited by R.LE pain today, RN aware. Pt ambulated 5' then 106' with seated rest break required in between. Pt unable to perform stairs today secondary to pain. Will discuss HHPT with pt next session.   PT Plan: Discharge plan remains appropriate PT Frequency: Min 5X/week Follow Up Recommendations: None Equipment Recommended: None recommended by PT PT Goals  Acute Rehab PT Goals PT Goal: Supine/Side to Sit - Progress:  (not assessed) PT Goal: Sit to Supine/Side - Progress:  (not assessed) PT Goal: Sit to Stand - Progress: Progressing toward goal PT Goal: Stand to Sit - Progress: Progressing toward goal PT Goal: Ambulate - Progress: Progressing toward goal PT Goal: Up/Down Stairs - Progress: Not met Additional Goals PT Goal: Additional Goal #1 - Progress: Progressing toward goal  PT Treatment Precautions/Restrictions  Precautions Precautions: Back Precaution Booklet Issued: Yes (comment) Precaution Comments: pt able to recall 3/3 back precautions with out assit. req's mod cues to demo with activity. Required Braces or Orthoses: Yes Spinal Brace: Lumbar corset;Applied in sitting position (min assist with cues to don correctly. friend educated also) Restrictions Weight Bearing Restrictions: No Mobility (including Balance) Bed Mobility Bed Mobility: Yes Sitting - Scoot to Edge of Bed: 5: Supervision Sitting - Scoot to Edge of Bed Details (indicate cue type and reason): Verbal cues to scoot without twisting Transfers Transfers: Yes Sit to Stand: 4: Min assist;From bed;With upper extremity assist;From chair/3-in-1 Sit to Stand Details (indicate cue type and reason): Verbal cues for sequencing - scooting to edge of bed/chair prior to standing. Verbal cues for precautions.    Stand to Sit: 4: Min assist;With upper extremity assist;To chair/3-in-1 Stand to Sit Details: Verbal and tactile cues to control descent with armrests - pt with difficulty finding armrests.  Ambulation/Gait Ambulation/Gait Assistance: 4: Min assist;5: Supervision Ambulation/Gait Assistance Details (indicate cue type and reason): Min assist only for tactile cues/facilitation of glutes to promote hip extension and upright posture with gait. Verbal cues to contract abdominals to stabilize back Ambulation Distance (Feet): 180 Feet (total: 74, seated rest, then 106) Assistive device: Rolling walker Gait Pattern: Step-through pattern;Decreased stride length;Antalgic;Decreased stance time - right Gait velocity: decreased. Stairs:  (unable today secondary to pain)    Exercise    Pt unable to tolerate today.  End of Session PT - End of Session Equipment Utilized During Treatment: Back brace Activity Tolerance: Patient limited by pain Patient left: with call bell in reach;with family/visitor present;in chair Nurse Communication: Mobility status for transfers;Mobility status for ambulation (pain medication) General Behavior During Session: [facility] for tasks performed Cognition: [facility] for tasks performed  Sherie Don 11/27/2011, 12:23 PM  Sherie Don) Carleene Mains PT, DPT Acute Rehabilitation [phone]

## 2011-11-28 MED FILL — Heparin Sodium (Porcine) Inj 1000 Unit/ML: INTRAMUSCULAR | Qty: 30 | Status: AC

## 2011-11-28 MED FILL — Sodium Chloride IV Soln 0.9%: INTRAVENOUS | Qty: 1000 | Status: AC

## 2011-11-28 MED FILL — Sodium Chloride Irrigation Soln 0.9%: Qty: 3000 | Status: AC

## 2011-11-28 NOTE — Progress Notes (Signed)
Physical Therapy Treatment Patient Details Name: Nancy Blackburn MRN: [id number] DOB: Dec 31, 1945 Today's Date: 11/28/2011  PT Assessment/Plan  PT - Assessment/Plan Comments on Treatment Session: Continues to be limited by RLE pain.  Discussed d/c plan with pt/daughter as MD had noted pt would have more help on d/c if waits until Thurs.  Daughter reports she will be out-of-town tomorrow Scientist, product/process development) and prefers pt come home Wed. PT Plan: Discharge plan needs to be updated;Frequency remains appropriate PT Frequency: Min 5X/week Follow Up Recommendations: Home health PT Equipment Recommended: None recommended by PT PT Goals  Acute Rehab PT Goals PT Goal: Supine/Side to Sit - Progress: Progressing toward goal Pt will go Sit to Supine/Side: Other (comment) (not tested) PT Goal: Sit to Supine/Side - Progress: Other (comment) PT Goal: Sit to Stand - Progress: Partly met PT Goal: Stand to Sit - Progress: Partly met PT Goal: Up/Down Stairs - Progress: Progressing toward goal  PT Treatment Precautions/Restrictions  Precautions Precautions: Back;Fall Precaution Booklet Issued: Yes (comment) Precaution Comments: able to verbalize 3/3 precautions; required min cues to prevent twisting during activities Required Braces or Orthoses: Yes Spinal Brace: Lumbar corset;Applied in sitting position Restrictions Weight Bearing Restrictions: No Mobility (including Balance) Bed Mobility Right Sidelying to Sit: 5: Supervision;HOB flat Right Sidelying to Sit Details (indicate cue type and reason): vc to reach away from mattress with LUE as coming up to sitting to prevent twisting; performed movement x 2 for instruction/practice Sitting - Scoot to Edge of Bed: 5: Supervision Sitting - Scoot to Edge of Bed Details (indicate cue type and reason): Supervision for safety Sit to Supine - Right: 6: Modified independent (Device/Increase time);HOB flat Transfers Sit to Stand: 4: Min assist;With upper extremity  assist;From elevated surface;From bed;From chair/3-in-1 Sit to Stand Details (indicate cue type and reason): minguard assist with vc for safe use of RW; max encouragement due to pt's fear of increasing pain Stand to Sit: 4: Min assist;With upper extremity assist;With armrests;To chair/3-in-1 Ambulation/Gait Ambulation/Gait Assistance: 5: Supervision Ambulation/Gait Assistance Details (indicate cue type and reason): max cues to lead with RLE (most painful) and for improved use of UEs to off-load RLE Ambulation Distance (Feet): 10 Feet (seated rest then 7 ft) Assistive device: Rolling walker Gait Pattern: Step-to pattern;Antalgic;Decreased stride length  Posture/Postural Control Posture/Postural Control: No significant limitations Exercise    End of Session PT - End of Session Equipment Utilized During Treatment: Back brace Activity Tolerance: Patient limited by pain Patient left: in chair;with call bell in reach;with family/visitor present Nurse Communication: Other (comment) (pain level) General Behavior During Session: Restless  Nancy Blackburn 11/28/2011, 2:43 PM Pager [phone]

## 2011-11-28 NOTE — Progress Notes (Signed)
Physical Therapy Treatment Patient Details Name: Nancy Blackburn MRN: [id number] DOB: 18-Nov-1946 Today's Date: 11/28/2011  PT Assessment/Plan  PT - Assessment/Plan Comments on Treatment Session: Continues to be limited by RLE pain.  Discussed d/c plan with pt/daughter as MD had noted pt would have more help on d/c if waits until Thurs.  Daughter reports she will be out-of-town tomorrow Scientist, product/process development) and prefers pt come home Wed. PT Plan: Discharge plan needs to be updated;Frequency remains appropriate PT Frequency: Min 5X/week Follow Up Recommendations: Home health PT Equipment Recommended: None recommended by PT PT Goals  Acute Rehab PT Goals PT Goal: Supine/Side to Sit - Progress: Progressing toward goal Pt will go Sit to Supine/Side: Other (comment) (not tested) PT Goal: Sit to Supine/Side - Progress: Other (comment) PT Goal: Sit to Stand - Progress: Partly met PT Goal: Stand to Sit - Progress: Partly met PT Goal: Up/Down Stairs - Progress: Progressing toward goal  PT Treatment Precautions/Restrictions  Precautions Precautions: Back;Fall Precaution Booklet Issued: Yes (comment) Precaution Comments: able to verbalize 3/3 precautions; required min cues to prevent twisting during activities Required Braces or Orthoses: Yes Spinal Brace: Lumbar corset;Applied in sitting position Restrictions Weight Bearing Restrictions: No Mobility (including Balance) Bed Mobility Right Sidelying to Sit: 5: Supervision;HOB flat Right Sidelying to Sit Details (indicate cue type and reason): vc to reach away from mattress with LUE as coming up to sitting to prevent twisting; performed movement x 2 for instruction/practice Sitting - Scoot to Edge of Bed: 5: Supervision Sitting - Scoot to Edge of Bed Details (indicate cue type and reason): Supervision for safety Sit to Supine - Right: 6: Modified independent (Device/Increase time);HOB flat Transfers Sit to Stand: 4: Min assist;With upper extremity  assist;From elevated surface;From bed;From chair/3-in-1 Sit to Stand Details (indicate cue type and reason): minguard assist with vc for safe use of RW; max encouragement due to pt's fear of increasing pain Stand to Sit: 4: Min assist;With upper extremity assist;With armrests;To chair/3-in-1 Ambulation/Gait Ambulation/Gait Assistance: 5: Supervision Ambulation/Gait Assistance Details (indicate cue type and reason): max cues to lead with RLE (most painful) and for improved use of UEs to off-load RLE Ambulation Distance (Feet): 10 Feet (seated rest then 7 ft) Assistive device: Rolling walker Gait Pattern: Step-to pattern;Antalgic;Decreased stride length  Posture/Postural Control Posture/Postural Control: No significant limitations Exercise  General Exercises - Lower Extremity Heel Slides: AROM;Strengthening;5 reps;Supine (resisted heelslides with RLE weaker than LLE) End of Session PT - End of Session Equipment Utilized During Treatment: Gait belt;Back brace Activity Tolerance: Patient limited by pain Patient left: in chair;with call bell in reach;with family/visitor present Nurse Communication: Other (comment) (requested pain medicine) General Behavior During Session: [facility] for tasks performed  Cliffton Spradley 11/28/2011, 2:52 PM Pager [phone]

## 2011-11-28 NOTE — Progress Notes (Signed)
Patient ID: Nancy Blackburn, female   DOB: December 21, 1945, 65 y.o.   MRN: [id number] Subjective:  The patient is alert and pleasant. She wants to stay in the hospital until Wednesday when she'll have more help at home. Her pain is under better control today.  Objective: Vital signs in last 24 hours: Temp:  [98.1 F (36.7 C)-101.2 F (38.4 C)] 98.6 F (37 C) (12/17 1014) Pulse Rate:  [79-109] 92  (12/17 1014) Resp:  [18-20] 18  (12/17 1014) BP: (109-159)/(60-85) 110/61 mmHg (12/17 1014) SpO2:  [91 %-94 %] 94 % (12/17 1014)  Intake/Output from previous day:   Intake/Output this shift:    Physical exam the patient is alert and oriented. Her motor strength is grossly normal in her bilateral lower extremities. Her wound is healing well without signs of infection, drainage, etc.  Lab Results:  [facility] 11/26/11 2233  WBC 10.4  HGB 10.6*  HCT 32.2*  PLT 174   BMET No results found for this basename: NA:2,K:2,CL:2,CO2:2,GLUCOSE:2,BUN:2,CREATININE:2,CALCIUM:2 in the last 72 hours  Studies/Results: Dg Chest Port 1 View  11/27/2011  *RADIOLOGY REPORT*  Clinical Data: Fever.  Postop  PORTABLE CHEST - 1 VIEW  Comparison: 11/11/2011  Findings: No eventration of the right hemidiaphragm is unchanged. Negative for pneumonia.  Negative for heart failure or effusion.  IMPRESSION: No active cardiopulmonary disease.  Original Report Authenticated By: Camelia Phenes, M.D.    Assessment/Plan: Postop day #4: The patient seems to be progressing well. She will likely go home on Wednesday.  Fevers: Her chest x-ray is normal, her white count was normal, she's had no more fevers.  LOS: 4 days     Delon Revelo D 11/28/2011, 12:43 PM

## 2011-11-29 NOTE — Progress Notes (Signed)
Physical Therapy Treatment Patient Details Name: Nancy Blackburn MRN: [id number] DOB: [birth year] Today's Date: 11/29/2011  PT Assessment/Plan  PT - Assessment/Plan Comments on Treatment Session: Pt states pain in RLE is better than yesterday.  Increased ambulation distance today.  Did not perform steps due to pt being drowsy & having difficulty keeping eyes open- RN reports pt received pain medications about an hr prior to therapist arrival & could be reason for drowsiness.  PT Plan: Discharge plan remains appropriate PT Frequency: Min 5X/week Follow Up Recommendations: Home health PT Equipment Recommended: None recommended by PT PT Goals  Acute Rehab PT Goals PT Goal: Supine/Side to Sit - Progress: Progressing toward goal PT Goal: Sit to Supine/Side - Progress: Progressing toward goal PT Goal: Sit to Stand - Progress: Progressing toward goal PT Goal: Stand to Sit - Progress: Progressing toward goal PT Goal: Ambulate - Progress: Progressing toward goal  PT Treatment Precautions/Restrictions  Precautions Precautions: Back Precaution Booklet Issued: Yes (comment) Precaution Comments: able to verbalize 3/3 precautions; required min cues to prevent twisting during activities Required Braces or Orthoses: Yes Spinal Brace: Lumbar corset;Applied in sitting position Restrictions Weight Bearing Restrictions: No Mobility (including Balance) Bed Mobility Rolling Right Details (indicate cue type and reason): pt lying on right side upon arrival  Right Sidelying to Sit: 5: Supervision;HOB flat Right Sidelying to Sit Details (indicate cue type and reason): Cues for hand placement & reinforcement of back precautions Sit to Supine - Right: 5: Supervision;HOB flat Sit to Supine - Right Details (indicate cue type and reason): cues to reinforce back precautions- no twisting Transfers Sit to Stand: Other (comment);From bed;From chair/3-in-1;With upper extremity assist (Min Guard (A)) Sit to  Stand Details (indicate cue type and reason): cues for safe hand placement Stand to Sit: To bed;To chair/3-in-1;With upper extremity assist;With armrests Stand to Sit Details: cues for body positioning before sitting & safe hand placement.   Ambulation/Gait Ambulation/Gait Assistance: Other (comment) (Min Guard (A)) Ambulation/Gait Assistance Details (indicate cue type and reason): Close guarding due to pt drowsy & had difficulty keeping eyes open & pt stating she felt "loopy".  Pt had been heavily medicated ~1hr prior to PT session per RN.   Ambulation Distance (Feet): 150 Feet Assistive device: Rolling walker Gait Pattern: Decreased stride length Stairs: No Wheelchair Mobility Wheelchair Mobility: No    Exercise    End of Session PT - End of Session Equipment Utilized During Treatment: Gait belt;Back brace Activity Tolerance: Other (comment) (limited secondary to drowsiness per pt.  ) Patient left: in bed;with call bell in reach General Behavior During Session: Other (comment) (drowsy) Cognition: [facility] for tasks performed  Lara Mulch 11/29/2011, 12:05 PM [phone]

## 2011-11-29 NOTE — Progress Notes (Signed)
Patient ID: Nancy Blackburn, female   DOB: 1946/08/25, 65 y.o.   MRN: [id number] Subjective:  The patient is alert and oriented. She looks well. She wants to go home tomorrow.  Objective: Vital signs in last 24 hours: Temp:  [98.6 F (37 C)-100.3 F (37.9 C)] 98.8 F (37.1 C) (12/18 0547) Pulse Rate:  [89-108] 89  (12/18 0547) Resp:  [16-18] 16  (12/18 0547) BP: (110-145)/(61-87) 134/72 mmHg (12/18 0547) SpO2:  [92 %-95 %] 95 % (12/18 0547)  Intake/Output from previous day:   Intake/Output this shift:    Physical exam patient is alert and oriented. Her incision is healing well. Her strength is grossly normal.  Lab Results:  [facility] 11/26/11 2233  WBC 10.4  HGB 10.6*  HCT 32.2*  PLT 174   BMET No results found for this basename: NA:2,K:2,CL:2,CO2:2,GLUCOSE:2,BUN:2,CREATININE:2,CALCIUM:2 in the last 72 hours  Studies/Results: No results found.  Assessment/Plan: Postop day 5: I will plan to discharge her tomorrow.  LOS: 5 days     Janeil Schexnayder D 11/29/2011, 7:32 AM

## 2011-11-29 NOTE — Progress Notes (Signed)
Occupational Therapy Treatment Patient Details Name: Nancy Blackburn MRN: [id number] DOB: [birth year] Today's Date: 11/29/2011 2:03-2:59  OT Assessment/Plan OT Assessment/Plan Comments on Treatment Session: Pt has met goals and/or been educated on all areas of BADLs. No further OT needs, will sign off. OT Plan: Discharge plan needs to be updated Follow Up Recommendations: None Equipment Recommended: None recommended by OT OT Goals ADL Goals ADL Goal: Grooming - Progress: Met (not seen, but pt moving in room at S/Mod I level) ADL Goal: Upper Body Bathing - Progress: Met (Pt aware to check with MD before showering) ADL Goal: Lower Body Bathing - Progress: Met ADL Goal: Upper Body Dressing - Progress: Met (Not seen but pt moving about room at S/Mod I level) ADL Goal: Lower Body Dressing - Progress: Met ADL Goal: Toilet Transfer - Progress: Met ADL Goal: Toileting - Clothing Manipulation - Progress: Met ADL Goal: Toileting - Hygiene - Progress: Not met (Educated on toliet aid to increase I.) ADL Goal: Web designer - Progress: Not addressed (Pt reports she does not need to practice this.)  OT Treatment Precautions/Restrictions  Precautions Precautions: Back Precaution Comments: able to verbalize 3/3 precautions Required Braces or Orthoses: Yes Spinal Brace: Lumbar corset Restrictions Weight Bearing Restrictions: No   ADL ADL Eating/Feeding: Not assessed Grooming: Not assessed Upper Body Bathing: Not assessed Lower Body Bathing: Set up Lower Body Bathing Details (indicate cue type and reason): Pt can cross her legs to get to her lower legs and feet. Where Assessed - Lower Body Bathing: Sitting, chair Upper Body Dressing: Not assessed Lower Body Dressing: Set up Lower Body Dressing Details (indicate cue type and reason): Pt can cross her legs to get to her lower legs and feet. Where Assessed - Lower Body Dressing: Sitting, chair Toilet Transfer:  Performed;Supervision/safety Toilet Transfer Method: Ambulating Toilet Transfer Equipment: Raised toilet seat with arms (or 3-in-1 over toilet) Toileting - Clothing Manipulation: Performed;Independent (gown and underwear) Where Assessed - Toileting Clothing Manipulation: Standing Toileting - Hygiene: Performed;Moderate assistance;Other (comment) (can do front, but not back) Toileting - Hygiene Details (indicate cue type and reason): educated pt and her daughter on toilet aids and where they could be attained here in [place]. Pt's daughter is also going to check at a place in [place]. Where Assessed - Toileting Hygiene: Standing Tub/Shower Transfer: Not assessed;Other (comment) (Pt states she does not feel she needs to practice this) Tub/Shower Transfer Details (indicate cue type and reason): Went over with pt and daughter how she could manage her shower curtain with the tub transfer bench so that water would not go everywhere from her hand held shower head. Tub/Shower Transfer Method: Not assessed Equipment Used: Rolling walker;Other (comment) (3-n-1, back brace) Mobility  Bed Mobility Bed Mobility: Yes Rolling Right Details (indicate cue type and reason): pt lying on right side upon arrival  Right Sidelying to Sit: 5: Supervision;HOB flat Right Sidelying to Sit Details (indicate cue type and reason): Cues for hand placement & reinforcement of back precautions Sit to Supine - Right: 7: Independent;HOB flat;Other (comment) (no rail) Sit to Supine - Right Details (indicate cue type and reason): cues to reinforce back precautions- no twisting Transfers Transfers: Yes Sit to Stand: 6: Modified independent (Device/Increase time);With upper extremity assist;From chair/3-in-1 Sit to Stand Details (indicate cue type and reason): cues for safe hand placement Stand to Sit: 6: Modified independent (Device/Increase time);With upper extremity assist;To bed Stand to Sit Details: cues for body  positioning before sitting & safe hand placement.   Exercises  End of Session OT - End of Session Equipment Utilized During Treatment: Back brace;Other (comment) (RW, 3-n-1) Activity Tolerance: Patient tolerated treatment well Patient left: in bed;with call bell in reach;with family/visitor present (daughter) General Behavior During Session: [name] for tasks performed Cognition: [facility] for tasks performed  Evette Georges [phone] 11/29/2011, 3:42 PM

## 2011-11-29 NOTE — Plan of Care (Signed)
Problem: Phase III Progression Outcomes Goal: Demonstrates donning/doffing brace Outcome: Completed/Met Date Met:  11/29/11 Able to do this with OT.  Ignacia Palma, OTR/L [phone] 11/29/2011

## 2011-11-29 NOTE — Progress Notes (Signed)
Pt currently is eating lunch.  Pt would like OT to return when her daughter is here to educate her about ADLS as well.  Daughter to come at 2.  Will attempt to revisit after 2 pm.   Tory Emerald, OTR/L  [phone]

## 2011-11-30 MED ORDER — OXYCODONE-ACETAMINOPHEN 10-325 MG PO TABS: 1.0000 | ORAL_TABLET | ORAL | Status: AC | PRN

## 2011-11-30 MED ORDER — DIAZEPAM 5 MG PO TABS: 5.0000 mg | ORAL_TABLET | Freq: Four times a day (QID) | ORAL | Status: AC | PRN

## 2011-11-30 NOTE — Progress Notes (Signed)
Patient ID: Nancy Blackburn, female   DOB: [birth year], 65 y.o.   MRN: [id number] Subjective:  This is alert and pleasant. She looks well and wants to go home.  Objective: Vital signs in last 24 hours: Temp:  [98.5 F (36.9 C)-99.2 F (37.3 C)] 98.5 F (36.9 C) (12/19 1015) Pulse Rate:  [79-107] 84  (12/19 1015) Resp:  [16-20] 20  (12/19 1015) BP: (125-188)/(68-100) 188/100 mmHg (12/19 1401) SpO2:  [90 %-94 %] 93 % (12/19 1015)  Intake/Output from previous day: 12/18 0701 - 12/19 0700 In: 480 [P.O.:480] Out: -  Intake/Output this shift:    Physical exam the patient is alert and oriented. Her strength is normal in her lower extremities. Her wound is healing well.  Lab Results: No results found for this basename: WBC:2,HGB:2,HCT:2,PLT:2 in the last 72 hours BMET No results found for this basename: NA:2,K:2,CL:2,CO2:2,GLUCOSE:2,BUN:2,CREATININE:2,CALCIUM:2 in the last 72 hours  Studies/Results: No results found.  Assessment/Plan: Postop day #6: Patient is doing well. Will discharge her to home. I have answered all her questions.  LOS: 6 days     Arika Mainer D 11/30/2011, 2:52 PM

## 2011-11-30 NOTE — Plan of Care (Signed)
Problem: Phase III Progression Outcomes Goal: Activity at appropriate level-compared to baseline (UP IN CHAIR FOR HEMODIALYSIS)  Outcome: Progressing Walks well to bathroom with front wheel walker

## 2011-11-30 NOTE — Progress Notes (Signed)
Physical Therapy Treatment Patient Details Name: Nancy Blackburn MRN: [id number] DOB: [birth year] Today's Date: 11/30/2011  PT Assessment/Plan  PT - Assessment/Plan Comments on Treatment Session: Pt moving fairly well.  Daughter present entire session.  Daughter will be staying with pt when d/c'd home.   PT Plan: Discharge plan remains appropriate PT Frequency: Min 5X/week Follow Up Recommendations: Home health PT Equipment Recommended: None recommended by PT PT Goals  Acute Rehab PT Goals PT Goal: Supine/Side to Sit - Progress: Met PT Goal: Sit to Stand - Progress: Met PT Goal: Stand to Sit - Progress: Met PT Goal: Ambulate - Progress: Met PT Goal: Up/Down Stairs - Progress: Progressing toward goal Additional Goals PT Goal: Additional Goal #1 - Progress: Progressing toward goal  PT Treatment Precautions/Restrictions  Precautions Precautions: Back Precaution Booklet Issued: Yes (comment) Precaution Comments: able to verbalize 3/3 precautions Required Braces or Orthoses: Yes Spinal Brace: Lumbar corset;Applied in sitting position Restrictions Weight Bearing Restrictions: No Mobility (including Balance) Bed Mobility Rolling Right: 5: Supervision Rolling Right Details (indicate cue type and reason): cues to reinforce back precautions & logrolling technique Right Sidelying to Sit: 5: Supervision;HOB flat Right Sidelying to Sit Details (indicate cue type and reason): cues to reinforce back precautions & technique Transfers Sit to Stand: 6: Modified independent (Device/Increase time);From chair/3-in-1;From bed;With upper extremity assist Stand to Sit: 5: Supervision;To bed;To chair/3-in-1;With upper extremity assist Stand to Sit Details: Cues for hand placement Ambulation/Gait Ambulation/Gait Assistance: 5: Supervision Ambulation/Gait Assistance Details (indicate cue type and reason): (S) for safety Ambulation Distance (Feet): 120 Feet Assistive device: Rolling  walker Gait Pattern: Step-through pattern;Decreased stride length Stairs: Yes Stairs Assistance: Other (comment) (Min guard (A)) Stairs Assistance Details (indicate cue type and reason): cues for sequencing & reinforcement of back precautions  Stair Management Technique: Two rails;Step to pattern;Forwards Number of Stairs: 4     Exercise    End of Session PT - End of Session Equipment Utilized During Treatment: Gait belt;Back brace Activity Tolerance: Patient tolerated treatment well Patient left: in bed General Behavior During Session: [facility] for tasks performed Cognition: [facility] for tasks performed  Lara Mulch 11/30/2011, 3:00 PM

## 2011-12-03 LAB — CULTURE, BLOOD (ROUTINE X 2)
Culture  Setup Time: 201212160238
Culture  Setup Time: 201212160238
Culture: NO GROWTH
Culture: NO GROWTH

## 2011-12-18 NOTE — Discharge Summary (Signed)
Physician Discharge Summary  Patient ID: Nancy Blackburn MRN: [id number] DOB/AGE: September 02, 1946 66 y.o.  Admit date: 11/24/2011 Discharge date: 12/18/2011  Admission Diagnoses:  Discharge Diagnoses:  Active Problems:  * No active hospital problems. *    Discharged Condition: good  Hospital Course: I admitted the patient to [facility] on 11/24/2011. On the day of admission I performed an exploration and revision of an L4-5 fusion. The surgery were well. The patient's postoperative course was unremarkable and she was discharged to home.  Consults: None Significant Diagnostic Studies: None Treatments: Exploration and revision of L4-5 fusion. Discharge Exam: Blood pressure 188/100, pulse 84, temperature 98.5 F (36.9 C), temperature source Oral, resp. rate 20, height 5\' 7"  (1.702 m), weight 83.915 kg (185 lb), SpO2 93.00%. The patient was alert and oriented. Her incision was healing well. Her lower extremity strength was grossly normal.  Disposition: Home  Discharge Orders    Future Orders Please Complete By Expires   Diet - low sodium heart healthy      Increase activity slowly      Discharge instructions      Comments:   The patient was given oral and written discharge instructions. All her questions were answered. He was instructed to call [phone] for a follow up appointment.   No wound care      Call MD for:  temperature >100.4      Call MD for:  persistant nausea and vomiting      Call MD for:  severe uncontrolled pain      Call MD for:  redness, tenderness, or signs of infection (pain, swelling, redness, odor or green/yellow discharge around incision site)      Call MD for:  difficulty breathing, headache or visual disturbances      Call MD for:  hives      Call MD for:  persistant dizziness or light-headedness      Call MD for:  extreme fatigue        Discharge Medication List as of 11/30/2011  3:01 PM    START taking these medications   Details    diazepam (VALIUM) 5 MG tablet Take 1 tablet (5 mg total) by mouth every 6 (six) hours as needed., Starting 11/30/2011, Until Sat 12/10/11, Print    oxyCODONE-acetaminophen (PERCOCET) 10-325 MG per tablet Take 1 tablet by mouth every 4 (four) hours as needed for pain., Starting 11/30/2011, Until Sat 12/10/11, Print      CONTINUE these medications which have NOT CHANGED   Details  BIOTIN PO Take 1 tablet by mouth daily.  , Until Discontinued, Historical Med    Calcium Carbonate-Vitamin D (CALCIUM + D PO) Take 1 tablet by mouth daily.  , Until Discontinued, Historical Med    cholecalciferol (VITAMIN D) 1000 UNITS tablet Take 1,000 Units by mouth daily.  , Until Discontinued, Historical Med    diphenhydrAMINE (BENADRYL) 25 MG tablet Take 25 mg by mouth every 6 (six) hours as needed. For allergies  , Until Discontinued, Historical Med    fish oil-omega-3 fatty acids 1000 MG capsule Take 1 g by mouth daily.  , Until Discontinued, Historical Med    HYDROcodone-acetaminophen (NORCO) 10-325 MG per tablet Take 0.5-1 tablets by mouth every 4 (four) hours as needed. For pain , Until Discontinued, Historical Med    lisinopril (PRINIVIL,ZESTRIL) 40 MG tablet Take 40 mg by mouth daily.  , Until Discontinued, Historical Med    metoprolol (LOPRESSOR) 50 MG tablet Take 50 mg by mouth  2 (two) times daily.  , Until Discontinued, Historical Med    naproxen (NAPROSYN) 500 MG tablet Take 500 mg by mouth 2 (two) times daily with a meal.  , Until Discontinued, Historical Med    simvastatin (ZOCOR) 40 MG tablet Take 40 mg by mouth at bedtime.  , Until Discontinued, Historical Med    citalopram (CELEXA) 20 MG tablet Take 20 mg by mouth every morning.  , Until Discontinued, Historical Med      STOP taking these medications     ALPRAZolam (XANAX) 0.5 MG tablet          Signed: Amilia Vandenbrink D 12/18/2011, 11:16 AM

## 2012-01-24 DIAGNOSIS — M4310 Spondylolisthesis, site unspecified: Secondary | ICD-10-CM | POA: Diagnosis not present

## 2012-02-27 DIAGNOSIS — J040 Acute laryngitis: Secondary | ICD-10-CM | POA: Diagnosis not present

## 2012-02-27 DIAGNOSIS — M549 Dorsalgia, unspecified: Secondary | ICD-10-CM | POA: Diagnosis not present

## 2012-02-27 DIAGNOSIS — F329 Major depressive disorder, single episode, unspecified: Secondary | ICD-10-CM | POA: Diagnosis not present

## 2012-04-10 DIAGNOSIS — F329 Major depressive disorder, single episode, unspecified: Secondary | ICD-10-CM | POA: Diagnosis not present

## 2012-04-10 DIAGNOSIS — Z79899 Other long term (current) drug therapy: Secondary | ICD-10-CM | POA: Diagnosis not present

## 2012-04-10 DIAGNOSIS — M549 Dorsalgia, unspecified: Secondary | ICD-10-CM | POA: Diagnosis not present

## 2012-04-10 DIAGNOSIS — I10 Essential (primary) hypertension: Secondary | ICD-10-CM | POA: Diagnosis not present

## 2012-04-10 DIAGNOSIS — Z23 Encounter for immunization: Secondary | ICD-10-CM | POA: Diagnosis not present

## 2012-05-01 DIAGNOSIS — M4310 Spondylolisthesis, site unspecified: Secondary | ICD-10-CM | POA: Diagnosis not present

## 2012-05-09 ENCOUNTER — Telehealth: Payer: Self-pay | Admitting: Cardiology

## 2012-05-09 DIAGNOSIS — I10 Essential (primary) hypertension: Secondary | ICD-10-CM | POA: Diagnosis not present

## 2012-05-09 DIAGNOSIS — R079 Chest pain, unspecified: Secondary | ICD-10-CM | POA: Diagnosis not present

## 2012-05-09 DIAGNOSIS — M549 Dorsalgia, unspecified: Secondary | ICD-10-CM | POA: Diagnosis not present

## 2012-05-09 DIAGNOSIS — Z23 Encounter for immunization: Secondary | ICD-10-CM | POA: Diagnosis not present

## 2012-05-09 NOTE — Telephone Encounter (Signed)
Ok per Dr Myrtis Ser.  Appt scheduled.  Sarah from [facility] was notified of date and time of appt.

## 2012-05-09 NOTE — Telephone Encounter (Signed)
Nancy Blackburn called from [facility] practice to get this pt scheduled for appt. I explained that Dr. Myrtis Ser was not taking new pt's and after 85yrs you are considered a new pt. Per pt she last saw Dr. Myrtis Ser but I (Lela) don't see that and per pt she was told she could come back to him as a patient she is having irregular heart beat, weak, & fatigue.

## 2012-05-18 ENCOUNTER — Encounter: Payer: Self-pay | Admitting: Cardiology

## 2012-05-18 ENCOUNTER — Other Ambulatory Visit: Payer: Self-pay | Admitting: Family Medicine

## 2012-05-18 ENCOUNTER — Ambulatory Visit (INDEPENDENT_AMBULATORY_CARE_PROVIDER_SITE_OTHER): Payer: Medicare Other | Admitting: Cardiology

## 2012-05-18 VITALS — BP 133/89 | HR 67 | Ht 67.0 in | Wt 180.0 lb

## 2012-05-18 DIAGNOSIS — M545 Low back pain, unspecified: Secondary | ICD-10-CM

## 2012-05-18 DIAGNOSIS — E785 Hyperlipidemia, unspecified: Secondary | ICD-10-CM

## 2012-05-18 DIAGNOSIS — R002 Palpitations: Secondary | ICD-10-CM

## 2012-05-18 DIAGNOSIS — R9431 Abnormal electrocardiogram [ECG] [EKG]: Secondary | ICD-10-CM

## 2012-05-18 DIAGNOSIS — R112 Nausea with vomiting, unspecified: Secondary | ICD-10-CM | POA: Diagnosis not present

## 2012-05-18 DIAGNOSIS — Z1231 Encounter for screening mammogram for malignant neoplasm of breast: Secondary | ICD-10-CM

## 2012-05-18 DIAGNOSIS — I10 Essential (primary) hypertension: Secondary | ICD-10-CM | POA: Diagnosis not present

## 2012-05-18 DIAGNOSIS — IMO0001 Reserved for inherently not codable concepts without codable children: Secondary | ICD-10-CM

## 2012-05-18 NOTE — Patient Instructions (Signed)
Your physician recommends that you schedule a follow-up appointment in: 3-4 weeks  Your physician has recommended that you wear an event monitor. Event monitors are medical devices that record the heart's electrical activity. Doctors most often us these monitors to diagnose arrhythmias. Arrhythmias are problems with the speed or rhythm of the heartbeat. The monitor is a small, portable device. You can wear one while you do your normal daily activities. This is usually used to diagnose what is causing palpitations/syncope (passing out).  Your physician has requested that you have an echocardiogram. Echocardiography is a painless test that uses sound waves to create images of your heart. It provides your doctor with information about the size and shape of your heart and how well your heart's chambers and valves are working. This procedure takes approximately one hour. There are no restrictions for this procedure.      

## 2012-05-18 NOTE — Assessment & Plan Note (Signed)
Blood pressure is controlled. She has been off her hydrochlorothiazide. She says that she feels better off of this. I encouraged her to remain off it for now.

## 2012-05-18 NOTE — Assessment & Plan Note (Signed)
The patient had palpitations one week ago. I think it is unlikely that she has any type of dangerous arrhythmia. She does need an event recorder because she has these palpitations intermittently but not every day. This will be arranged.

## 2012-05-18 NOTE — Assessment & Plan Note (Signed)
At this time there is no plan to repeat a stress test. I will see her back for followup

## 2012-05-18 NOTE — Assessment & Plan Note (Signed)
The patient's resting EKG is abnormal. Unfortunately there is no prior tracing for comparison. However I have now found some old records from 2006. There is description of decreased anterior R-wave progression at that time. There was a nuclear scan done at that time. There was mild breast attenuation. Ejection fraction was high. We need to proceed with a two-dimensional echo at this time to reassess LV function.

## 2012-05-18 NOTE — Progress Notes (Signed)
HPI  Patient is seen today to reestablish cardiac care and to be evaluated for palpitations. I have searched for all the information that have not been able to find it in her current computerized records. I saw the patient approximately 7 years ago. She relates a history that sounds like an abnormal EKG. I think she had an echo at that time. She's been stable over time.  Approximately 7 days ago the patient had an episode of feeling palpitations. She had some clamminess with it. There was no significant chest pain. There was no syncope or presyncope.  She was able to continue on with her activities on that day. She thought that it lasted approximately 4 hours.  As part of today's evaluation I have also reviewed records sent to me by her primary physician.  Allergies  Allergen Reactions  . Clarithromycin Other (See Comments)    Upsets stomach    Current Outpatient Prescriptions  Medication Sig Dispense Refill  . aspirin 81 MG tablet Take 81 mg by mouth daily.      Marland Kitchen BIOTIN PO Take 1 tablet by mouth daily.        . Calcium Carbonate-Vitamin D (CALCIUM + D PO) Take 2 tablets by mouth daily.       . cholecalciferol (VITAMIN D) 1000 UNITS tablet Take 1,000 Units by mouth daily.        . diphenhydrAMINE (BENADRYL) 25 MG tablet Take 25 mg by mouth every 6 (six) hours as needed. For allergies        . fish oil-omega-3 fatty acids 1000 MG capsule Take 1 g by mouth daily.        Marland Kitchen HYDROcodone-acetaminophen (NORCO) 10-325 MG per tablet Take 0.5-1 tablets by mouth every 4 (four) hours as needed. For pain       . lisinopril (PRINIVIL,ZESTRIL) 40 MG tablet Take 40 mg by mouth daily.        . metoprolol (LOPRESSOR) 50 MG tablet Take 50 mg by mouth 2 (two) times daily.        . naproxen (NAPROSYN) 500 MG tablet Take 500 mg by mouth 2 (two) times daily with a meal.        . simvastatin (ZOCOR) 40 MG tablet Take 40 mg by mouth at bedtime.          History   Social History  . Marital Status: Divorced      Spouse Name: N/A    Number of Children: N/A  . Years of Education: N/A   Occupational History  . Not on file.   Social History Main Topics  . Smoking status: Never Smoker   . Smokeless tobacco: Never Used  . Alcohol Use: 0.0 oz/week    0 Glasses of wine per week     drinks 3-4 times a year  . Drug Use: No  . Sexually Active: Not on file   Other Topics Concern  . Not on file   Social History Narrative  . No narrative on file    Family History  Problem Relation Age of Onset  . Anesthesia problems Mother   . Hypotension Neg Hx   . Malignant hyperthermia Neg Hx   . Pseudochol deficiency Neg Hx     Past Medical History  Diagnosis Date  . PONV (postoperative nausea and vomiting)   . Hypertension     takes Metoprolol and Lisinopril  . Hyperlipidemia     takes Simvastatin  . Palpitations     June, 2013  .  Abnormal EKG   . Low back pain     Past Surgical History  Procedure Date  . Tonsillectomy     as child  . Tubal ligation     66yrs ago  . Thyroidectomy, partial   . Vaginal hysterectomy     45yrs ago d/t cervical cancer  . Bladder suspension 2003  . Bone spur 2009    removed from right collar bone area and partial collar bone removed  . Back surgery 08/2010    ROS Patient denies fever, chills, headache, sweats, rash, change in vision, change in hearing, chest pain, cough, nausea vomiting, urinary symptoms. All other systems are reviewed and are negative.  PHYSICAL EXAM  Patient is oriented to person time and place. Affect is normal. There is no jugulovenous distention. There no carotid bruits. Lungs are clear. Respiratory effort is nonlabored. Cardiac exam reveals S1 and S2. There no clicks or significant murmurs. The abdomen is soft. There is no peripheral edema. There no musculoskeletal deformities. There are no skin rashes.  Filed Vitals:   05/18/12 1338  BP: 133/89  Pulse: 67  Height: 5\' 7"  (1.702 m)  Weight: 180 lb (81.647 kg)   EKG is done  today and reviewed by me. The patient has sinus rhythm. There is decreased anterior R wave progression. I do not have an old tracing for comparison.  ASSESSMENT & PLAN

## 2012-05-24 ENCOUNTER — Ambulatory Visit (HOSPITAL_COMMUNITY): Payer: Medicare Other | Attending: Cardiology

## 2012-05-24 ENCOUNTER — Encounter: Payer: Self-pay | Admitting: Cardiology

## 2012-05-24 ENCOUNTER — Encounter (INDEPENDENT_AMBULATORY_CARE_PROVIDER_SITE_OTHER): Payer: Medicare Other

## 2012-05-24 DIAGNOSIS — I10 Essential (primary) hypertension: Secondary | ICD-10-CM | POA: Insufficient documentation

## 2012-05-24 DIAGNOSIS — R9431 Abnormal electrocardiogram [ECG] [EKG]: Secondary | ICD-10-CM | POA: Diagnosis not present

## 2012-05-24 DIAGNOSIS — E785 Hyperlipidemia, unspecified: Secondary | ICD-10-CM | POA: Diagnosis not present

## 2012-05-24 DIAGNOSIS — I4589 Other specified conduction disorders: Secondary | ICD-10-CM | POA: Diagnosis not present

## 2012-05-24 DIAGNOSIS — R002 Palpitations: Secondary | ICD-10-CM | POA: Diagnosis not present

## 2012-05-24 NOTE — Progress Notes (Signed)
Echocardiogram performed.  

## 2012-05-25 ENCOUNTER — Ambulatory Visit
Admission: RE | Admit: 2012-05-25 | Discharge: 2012-05-25 | Disposition: A | Discharge status: Home / Self Care | Payer: Medicare Other | Source: Ambulatory Visit | Attending: Family Medicine | Admitting: Family Medicine

## 2012-05-25 DIAGNOSIS — Z1231 Encounter for screening mammogram for malignant neoplasm of breast: Secondary | ICD-10-CM

## 2012-05-28 ENCOUNTER — Encounter: Payer: Self-pay | Admitting: Cardiology

## 2012-05-28 DIAGNOSIS — R9430 Abnormal result of cardiovascular function study, unspecified: Secondary | ICD-10-CM | POA: Insufficient documentation

## 2012-05-28 DIAGNOSIS — I313 Pericardial effusion (noninflammatory): Secondary | ICD-10-CM | POA: Insufficient documentation

## 2012-06-18 ENCOUNTER — Ambulatory Visit: Payer: Medicare Other | Admitting: Cardiology

## 2012-07-24 DIAGNOSIS — I10 Essential (primary) hypertension: Secondary | ICD-10-CM | POA: Diagnosis not present

## 2012-07-24 DIAGNOSIS — M129 Arthropathy, unspecified: Secondary | ICD-10-CM | POA: Diagnosis not present

## 2012-07-24 DIAGNOSIS — Z23 Encounter for immunization: Secondary | ICD-10-CM | POA: Diagnosis not present

## 2012-07-24 DIAGNOSIS — M549 Dorsalgia, unspecified: Secondary | ICD-10-CM | POA: Diagnosis not present

## 2012-08-03 DIAGNOSIS — M4310 Spondylolisthesis, site unspecified: Secondary | ICD-10-CM | POA: Diagnosis not present

## 2012-08-06 ENCOUNTER — Ambulatory Visit: Payer: Medicare Other | Admitting: Cardiology

## 2012-08-08 DIAGNOSIS — E785 Hyperlipidemia, unspecified: Secondary | ICD-10-CM | POA: Diagnosis not present

## 2012-08-08 DIAGNOSIS — Z79899 Other long term (current) drug therapy: Secondary | ICD-10-CM | POA: Diagnosis not present

## 2012-08-13 ENCOUNTER — Encounter: Payer: Self-pay | Admitting: Cardiology

## 2012-08-16 DIAGNOSIS — M2240 Chondromalacia patellae, unspecified knee: Secondary | ICD-10-CM | POA: Diagnosis not present

## 2012-08-16 DIAGNOSIS — S92309A Fracture of unspecified metatarsal bone(s), unspecified foot, initial encounter for closed fracture: Secondary | ICD-10-CM | POA: Diagnosis not present

## 2012-09-10 ENCOUNTER — Ambulatory Visit: Payer: Self-pay | Admitting: Orthopedic Surgery

## 2012-09-10 DIAGNOSIS — S83419A Sprain of medial collateral ligament of unspecified knee, initial encounter: Secondary | ICD-10-CM | POA: Diagnosis not present

## 2012-09-10 DIAGNOSIS — IMO0002 Reserved for concepts with insufficient information to code with codable children: Secondary | ICD-10-CM | POA: Diagnosis not present

## 2012-09-13 ENCOUNTER — Encounter: Payer: Self-pay | Admitting: Cardiology

## 2012-09-13 DIAGNOSIS — M2240 Chondromalacia patellae, unspecified knee: Secondary | ICD-10-CM | POA: Diagnosis not present

## 2012-09-13 DIAGNOSIS — S92309A Fracture of unspecified metatarsal bone(s), unspecified foot, initial encounter for closed fracture: Secondary | ICD-10-CM | POA: Diagnosis not present

## 2012-09-26 ENCOUNTER — Encounter: Payer: Self-pay | Admitting: Cardiology

## 2012-09-26 ENCOUNTER — Ambulatory Visit (INDEPENDENT_AMBULATORY_CARE_PROVIDER_SITE_OTHER): Payer: Medicare Other | Admitting: Cardiology

## 2012-09-26 VITALS — BP 140/90 | HR 69 | Ht 67.0 in | Wt 175.4 lb

## 2012-09-26 DIAGNOSIS — I319 Disease of pericardium, unspecified: Secondary | ICD-10-CM

## 2012-09-26 DIAGNOSIS — R002 Palpitations: Secondary | ICD-10-CM

## 2012-09-26 DIAGNOSIS — I313 Pericardial effusion (noninflammatory): Secondary | ICD-10-CM

## 2012-09-26 NOTE — Progress Notes (Signed)
Patient ID: Nancy Blackburn, female   DOB: 1946-07-15, 66 y.o.   MRN: [id number]   HPI  Patient is seen for followup palpitations very is I saw her in June, 2013. At that time some of her feeling poorly was possibly related to adjustments in medications. Also we thought she probably had some premature beats. Decision was made to proceed with echo and event recorder. The echo revealed normal left ventricular function. There were no significant valvular abnormalities. Her event recorder revealed normal sinus rhythm. There were rare PVCs. Since that time she's felt well. She's not having any marked problems.  Allergies  Allergen Reactions  . Clarithromycin Other (See Comments)    Upsets stomach    Current Outpatient Prescriptions  Medication Sig Dispense Refill  . aspirin 81 MG tablet Take 81 mg by mouth daily.      Marland Kitchen BIOTIN PO Take 5,000 mcg by mouth daily.       . Calcium Carbonate-Vitamin D (CALCIUM + D PO) Take 2 tablets by mouth daily.       . cholecalciferol (VITAMIN D) 1000 UNITS tablet Take 1,000 Units by mouth daily.        . diazepam (VALIUM) 5 MG tablet Take 5 mg by mouth every 6 (six) hours as needed.      . diphenhydrAMINE (BENADRYL) 25 MG tablet Take 25 mg by mouth every 6 (six) hours as needed. For allergies        . fish oil-omega-3 fatty acids 1000 MG capsule Take 1 g by mouth daily.        Marland Kitchen HYDROcodone-acetaminophen (NORCO) 10-325 MG per tablet Take 0.5-1 tablets by mouth every 4 (four) hours as needed. For pain       . lisinopril (PRINIVIL,ZESTRIL) 40 MG tablet Take 40 mg by mouth daily.        . metoprolol (LOPRESSOR) 50 MG tablet Take 50 mg by mouth 2 (two) times daily.        . naproxen (NAPROSYN) 500 MG tablet Take 500 mg by mouth 2 (two) times daily with a meal.        . simvastatin (ZOCOR) 40 MG tablet Take 40 mg by mouth at bedtime.          History   Social History  . Marital Status: Divorced    Spouse Name: N/A    Number of Children: N/A  . Years of  Education: N/A   Occupational History  . Not on file.   Social History Main Topics  . Smoking status: Never Smoker   . Smokeless tobacco: Never Used  . Alcohol Use: 0.0 oz/week    0 Glasses of wine per week     drinks 3-4 times a year  . Drug Use: No  . Sexually Active: Not on file   Other Topics Concern  . Not on file   Social History Narrative  . No narrative on file    Family History  Problem Relation Age of Onset  . Anesthesia problems Mother   . Hypotension Neg Hx   . Malignant hyperthermia Neg Hx   . Pseudochol deficiency Neg Hx     Past Medical History  Diagnosis Date  . PONV (postoperative nausea and vomiting)   . Hypertension     takes Metoprolol and Lisinopril  . Hyperlipidemia     takes Simvastatin  . Palpitations     June, 2013,  Event recorder normal sinus rhythm with rare PVCs  ??patient may feel PVCs ??  .  Abnormal EKG   . Low back pain   . Normal nuclear stress test     The patient had a normal stress nuclear scan in August,  2006  . Ejection fraction     EF 55-65%, echo, mild LVH, June, 2013, small pericardial effusion  . Pericardial effusion     Small, echo, June, 2013    Past Surgical History  Procedure Date  . Tonsillectomy     as child  . Tubal ligation     60yrs ago  . Thyroidectomy, partial   . Vaginal hysterectomy     1yrs ago d/t cervical cancer  . Bladder suspension 2003  . Bone spur 2009    removed from right collar bone area and partial collar bone removed  . Back surgery 08/2010    Patient Active Problem List  Diagnosis  . Abnormal EKG  . PONV (postoperative nausea and vomiting)  . Hypertension  . Hyperlipidemia  . Palpitations  . Low back pain  . Normal nuclear stress test  . Ejection fraction  . Pericardial effusion    ROS   Patient denies fever, chills, headache, sweats, rash, change in vision, change in hearing, chest pain, cough, nausea vomiting, urinary symptoms. All other systems are reviewed and are  negative.  PHYSICAL EXAM  Patient is oriented to person time and place. Affect is normal. Lungs are clear. Respiratory effort is nonlabored. Cardiac exam reveals S1 and S2. There no clicks or significant murmurs. The abdomen is soft. Is no peripheral edema.   Filed Vitals:   09/26/12 1153  BP: 140/90  Pulse: 69  Height: 5\' 7"  (1.702 m)  Weight: 175 lb 6.4 oz (79.561 kg)  SpO2: 96%     ASSESSMENT & PLAN

## 2012-09-26 NOTE — Assessment & Plan Note (Signed)
The patient has only rare PVCs. She does not need any further workup.

## 2012-09-26 NOTE — Assessment & Plan Note (Signed)
There was a trivial pericardial effusion with her echo in June, 2013. This does not require any further followup.

## 2012-09-26 NOTE — Patient Instructions (Addendum)
No follow up needed.  Call us if we can help you or if you need [place].

## 2012-10-10 DIAGNOSIS — Z1339 Encounter for screening examination for other mental health and behavioral disorders: Secondary | ICD-10-CM | POA: Diagnosis not present

## 2012-10-10 DIAGNOSIS — M549 Dorsalgia, unspecified: Secondary | ICD-10-CM | POA: Diagnosis not present

## 2012-10-10 DIAGNOSIS — Z23 Encounter for immunization: Secondary | ICD-10-CM | POA: Diagnosis not present

## 2012-10-10 DIAGNOSIS — Z1331 Encounter for screening for depression: Secondary | ICD-10-CM | POA: Diagnosis not present

## 2012-10-10 DIAGNOSIS — Z0000 Encounter for general adult medical examination without abnormal findings: Secondary | ICD-10-CM | POA: Diagnosis not present

## 2012-10-10 DIAGNOSIS — M129 Arthropathy, unspecified: Secondary | ICD-10-CM | POA: Diagnosis not present

## 2012-10-17 DIAGNOSIS — E2839 Other primary ovarian failure: Secondary | ICD-10-CM | POA: Diagnosis not present

## 2012-10-17 DIAGNOSIS — N951 Menopausal and female climacteric states: Secondary | ICD-10-CM | POA: Diagnosis not present

## 2012-10-17 DIAGNOSIS — M899 Disorder of bone, unspecified: Secondary | ICD-10-CM | POA: Diagnosis not present

## 2012-10-17 LAB — HM DEXA SCAN

## 2012-10-24 DIAGNOSIS — Z8541 Personal history of malignant neoplasm of cervix uteri: Secondary | ICD-10-CM | POA: Diagnosis not present

## 2012-10-24 DIAGNOSIS — Z90710 Acquired absence of both cervix and uterus: Secondary | ICD-10-CM | POA: Diagnosis not present

## 2012-10-24 DIAGNOSIS — N952 Postmenopausal atrophic vaginitis: Secondary | ICD-10-CM | POA: Diagnosis not present

## 2012-10-24 DIAGNOSIS — Z01419 Encounter for gynecological examination (general) (routine) without abnormal findings: Secondary | ICD-10-CM | POA: Diagnosis not present

## 2012-11-01 DIAGNOSIS — Z1339 Encounter for screening examination for other mental health and behavioral disorders: Secondary | ICD-10-CM | POA: Diagnosis not present

## 2012-11-01 DIAGNOSIS — I10 Essential (primary) hypertension: Secondary | ICD-10-CM | POA: Diagnosis not present

## 2012-11-01 DIAGNOSIS — F329 Major depressive disorder, single episode, unspecified: Secondary | ICD-10-CM | POA: Diagnosis not present

## 2012-11-01 DIAGNOSIS — Z1331 Encounter for screening for depression: Secondary | ICD-10-CM | POA: Diagnosis not present

## 2012-11-05 DIAGNOSIS — M75100 Unspecified rotator cuff tear or rupture of unspecified shoulder, not specified as traumatic: Secondary | ICD-10-CM | POA: Diagnosis not present

## 2012-11-05 DIAGNOSIS — S92309A Fracture of unspecified metatarsal bone(s), unspecified foot, initial encounter for closed fracture: Secondary | ICD-10-CM | POA: Diagnosis not present

## 2012-11-05 DIAGNOSIS — M2240 Chondromalacia patellae, unspecified knee: Secondary | ICD-10-CM | POA: Diagnosis not present

## 2012-11-28 DIAGNOSIS — F329 Major depressive disorder, single episode, unspecified: Secondary | ICD-10-CM | POA: Diagnosis not present

## 2012-11-28 DIAGNOSIS — E7800 Pure hypercholesterolemia, unspecified: Secondary | ICD-10-CM | POA: Diagnosis not present

## 2012-11-28 DIAGNOSIS — I10 Essential (primary) hypertension: Secondary | ICD-10-CM | POA: Diagnosis not present

## 2012-11-28 DIAGNOSIS — F411 Generalized anxiety disorder: Secondary | ICD-10-CM | POA: Diagnosis not present

## 2012-11-29 DIAGNOSIS — M2240 Chondromalacia patellae, unspecified knee: Secondary | ICD-10-CM | POA: Diagnosis not present

## 2012-11-29 DIAGNOSIS — M75100 Unspecified rotator cuff tear or rupture of unspecified shoulder, not specified as traumatic: Secondary | ICD-10-CM | POA: Diagnosis not present

## 2012-12-26 DIAGNOSIS — F329 Major depressive disorder, single episode, unspecified: Secondary | ICD-10-CM | POA: Diagnosis not present

## 2012-12-26 DIAGNOSIS — M129 Arthropathy, unspecified: Secondary | ICD-10-CM | POA: Diagnosis not present

## 2012-12-26 DIAGNOSIS — M549 Dorsalgia, unspecified: Secondary | ICD-10-CM | POA: Diagnosis not present

## 2012-12-26 DIAGNOSIS — I10 Essential (primary) hypertension: Secondary | ICD-10-CM | POA: Diagnosis not present

## 2013-01-03 DIAGNOSIS — K620 Anal polyp: Secondary | ICD-10-CM | POA: Diagnosis not present

## 2013-01-03 DIAGNOSIS — R195 Other fecal abnormalities: Secondary | ICD-10-CM | POA: Diagnosis not present

## 2013-01-03 DIAGNOSIS — Z800 Family history of malignant neoplasm of digestive organs: Secondary | ICD-10-CM | POA: Diagnosis not present

## 2013-01-03 DIAGNOSIS — Z1211 Encounter for screening for malignant neoplasm of colon: Secondary | ICD-10-CM | POA: Diagnosis not present

## 2013-01-03 DIAGNOSIS — D126 Benign neoplasm of colon, unspecified: Secondary | ICD-10-CM | POA: Diagnosis not present

## 2013-01-03 DIAGNOSIS — K621 Rectal polyp: Secondary | ICD-10-CM | POA: Diagnosis not present

## 2013-01-03 LAB — HM COLONOSCOPY

## 2013-04-12 DIAGNOSIS — IMO0002 Reserved for concepts with insufficient information to code with codable children: Secondary | ICD-10-CM | POA: Diagnosis not present

## 2013-04-12 DIAGNOSIS — M1710 Unilateral primary osteoarthritis, unspecified knee: Secondary | ICD-10-CM | POA: Diagnosis not present

## 2013-05-03 DIAGNOSIS — M549 Dorsalgia, unspecified: Secondary | ICD-10-CM | POA: Diagnosis not present

## 2013-05-03 DIAGNOSIS — M545 Low back pain: Secondary | ICD-10-CM | POA: Diagnosis not present

## 2013-06-11 IMAGING — CR DG CHEST 2V
2 series · 2 of 2 positions shown · non-contrast
Comparison: 09/02/2010

CLINICAL DATA: Preoperative respiratory evaluation for lumbar
fusion

CHEST - 2 VIEW

[view not recorded (1 of 2)]
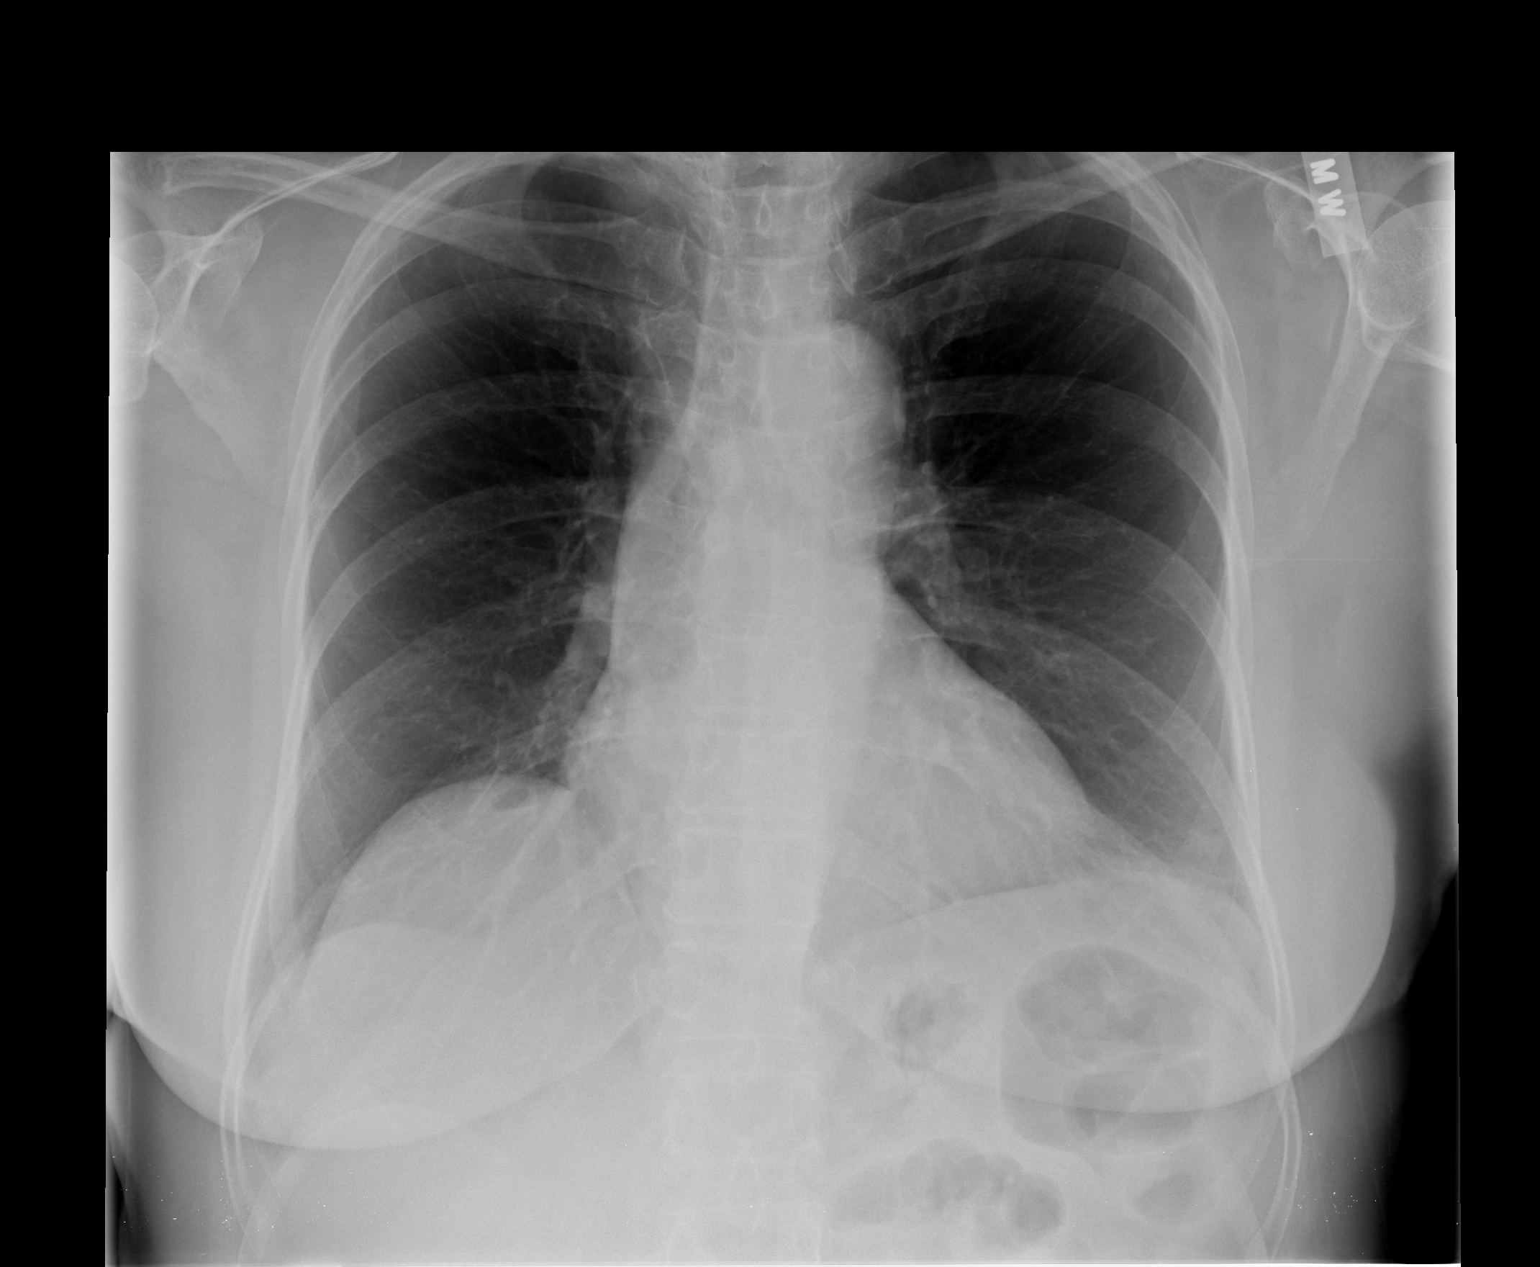

[view not recorded (2 of 2)]
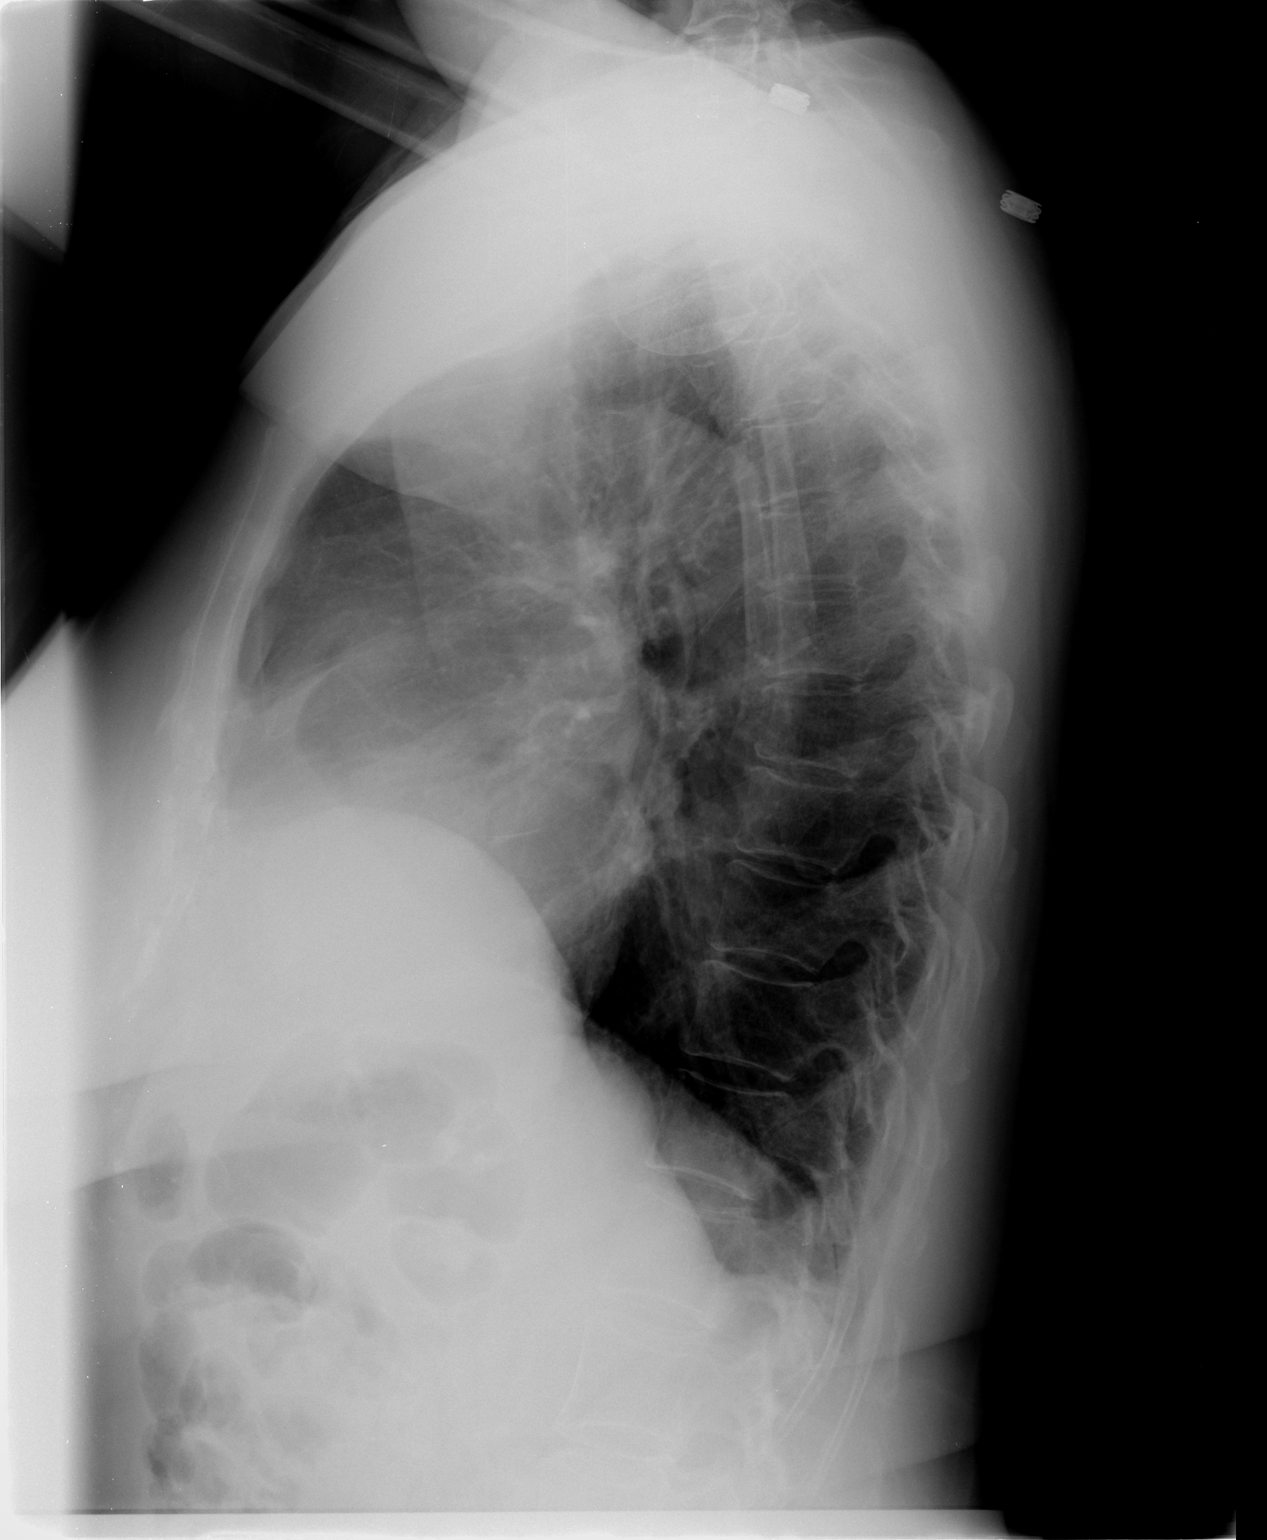

[2 of 2 positions shown; findings below may reference images not displayed]

FINDINGS: The lungs are clear without focal infiltrate, edema,
pneumothorax or pleural effusion. Cardiopericardial silhouette is
at upper limits of normal for size. Imaged bony structures of the
thorax are intact.
IMPRESSION: No acute findings.

## 2013-06-25 DIAGNOSIS — I10 Essential (primary) hypertension: Secondary | ICD-10-CM | POA: Diagnosis not present

## 2013-06-25 DIAGNOSIS — E7800 Pure hypercholesterolemia, unspecified: Secondary | ICD-10-CM | POA: Diagnosis not present

## 2013-06-25 DIAGNOSIS — M129 Arthropathy, unspecified: Secondary | ICD-10-CM | POA: Diagnosis not present

## 2013-06-25 DIAGNOSIS — F329 Major depressive disorder, single episode, unspecified: Secondary | ICD-10-CM | POA: Diagnosis not present

## 2013-07-30 DIAGNOSIS — I10 Essential (primary) hypertension: Secondary | ICD-10-CM | POA: Diagnosis not present

## 2013-07-30 DIAGNOSIS — M129 Arthropathy, unspecified: Secondary | ICD-10-CM | POA: Diagnosis not present

## 2013-07-30 DIAGNOSIS — F329 Major depressive disorder, single episode, unspecified: Secondary | ICD-10-CM | POA: Diagnosis not present

## 2013-07-30 DIAGNOSIS — F411 Generalized anxiety disorder: Secondary | ICD-10-CM | POA: Diagnosis not present

## 2013-10-04 ENCOUNTER — Inpatient Hospital Stay: Payer: Self-pay | Admitting: Internal Medicine

## 2013-10-04 DIAGNOSIS — E860 Dehydration: Secondary | ICD-10-CM | POA: Diagnosis not present

## 2013-10-04 DIAGNOSIS — E785 Hyperlipidemia, unspecified: Secondary | ICD-10-CM | POA: Diagnosis not present

## 2013-10-04 DIAGNOSIS — Z66 Do not resuscitate: Secondary | ICD-10-CM | POA: Diagnosis not present

## 2013-10-04 DIAGNOSIS — K921 Melena: Secondary | ICD-10-CM | POA: Diagnosis not present

## 2013-10-04 DIAGNOSIS — M549 Dorsalgia, unspecified: Secondary | ICD-10-CM | POA: Diagnosis not present

## 2013-10-04 DIAGNOSIS — K279 Peptic ulcer, site unspecified, unspecified as acute or chronic, without hemorrhage or perforation: Secondary | ICD-10-CM | POA: Diagnosis not present

## 2013-10-04 DIAGNOSIS — F411 Generalized anxiety disorder: Secondary | ICD-10-CM | POA: Diagnosis not present

## 2013-10-04 DIAGNOSIS — W19XXXA Unspecified fall, initial encounter: Secondary | ICD-10-CM | POA: Diagnosis not present

## 2013-10-04 DIAGNOSIS — Z90710 Acquired absence of both cervix and uterus: Secondary | ICD-10-CM | POA: Diagnosis not present

## 2013-10-04 DIAGNOSIS — D62 Acute posthemorrhagic anemia: Secondary | ICD-10-CM | POA: Diagnosis not present

## 2013-10-04 DIAGNOSIS — Z9181 History of falling: Secondary | ICD-10-CM | POA: Diagnosis not present

## 2013-10-04 DIAGNOSIS — Z87410 Personal history of cervical dysplasia: Secondary | ICD-10-CM | POA: Diagnosis not present

## 2013-10-04 DIAGNOSIS — K922 Gastrointestinal hemorrhage, unspecified: Secondary | ICD-10-CM | POA: Diagnosis not present

## 2013-10-04 DIAGNOSIS — R279 Unspecified lack of coordination: Secondary | ICD-10-CM | POA: Diagnosis not present

## 2013-10-04 DIAGNOSIS — K254 Chronic or unspecified gastric ulcer with hemorrhage: Secondary | ICD-10-CM | POA: Diagnosis not present

## 2013-10-04 DIAGNOSIS — R42 Dizziness and giddiness: Secondary | ICD-10-CM | POA: Diagnosis not present

## 2013-10-04 DIAGNOSIS — D500 Iron deficiency anemia secondary to blood loss (chronic): Secondary | ICD-10-CM | POA: Diagnosis not present

## 2013-10-04 DIAGNOSIS — R262 Difficulty in walking, not elsewhere classified: Secondary | ICD-10-CM | POA: Diagnosis not present

## 2013-10-04 DIAGNOSIS — R5381 Other malaise: Secondary | ICD-10-CM | POA: Diagnosis not present

## 2013-10-04 DIAGNOSIS — M6281 Muscle weakness (generalized): Secondary | ICD-10-CM | POA: Diagnosis not present

## 2013-10-04 DIAGNOSIS — I10 Essential (primary) hypertension: Secondary | ICD-10-CM | POA: Diagnosis not present

## 2013-10-04 DIAGNOSIS — Z79899 Other long term (current) drug therapy: Secondary | ICD-10-CM | POA: Diagnosis not present

## 2013-10-04 DIAGNOSIS — K259 Gastric ulcer, unspecified as acute or chronic, without hemorrhage or perforation: Secondary | ICD-10-CM | POA: Diagnosis not present

## 2013-10-04 DIAGNOSIS — D649 Anemia, unspecified: Secondary | ICD-10-CM | POA: Diagnosis not present

## 2013-10-04 DIAGNOSIS — Z86010 Personal history of colonic polyps: Secondary | ICD-10-CM | POA: Diagnosis not present

## 2013-10-04 DIAGNOSIS — Z1212 Encounter for screening for malignant neoplasm of rectum: Secondary | ICD-10-CM | POA: Diagnosis not present

## 2013-10-04 DIAGNOSIS — I509 Heart failure, unspecified: Secondary | ICD-10-CM | POA: Diagnosis not present

## 2013-10-04 DIAGNOSIS — Z833 Family history of diabetes mellitus: Secondary | ICD-10-CM | POA: Diagnosis not present

## 2013-10-04 DIAGNOSIS — G894 Chronic pain syndrome: Secondary | ICD-10-CM | POA: Diagnosis present

## 2013-10-04 DIAGNOSIS — K920 Hematemesis: Secondary | ICD-10-CM | POA: Diagnosis not present

## 2013-10-04 DIAGNOSIS — R799 Abnormal finding of blood chemistry, unspecified: Secondary | ICD-10-CM | POA: Diagnosis not present

## 2013-10-04 DIAGNOSIS — K219 Gastro-esophageal reflux disease without esophagitis: Secondary | ICD-10-CM | POA: Diagnosis not present

## 2013-10-04 LAB — URINALYSIS, COMPLETE
Bilirubin,UR: NEGATIVE
Blood: NEGATIVE
Glucose,UR: NEGATIVE mg/dL (ref 0–75)
Ketone: NEGATIVE
Nitrite: POSITIVE
Ph: 6 (ref 4.5–8.0)
Protein: NEGATIVE
RBC,UR: 2 /HPF (ref 0–5)
Specific Gravity: 1.011 (ref 1.003–1.030)
Squamous Epithelial: 1
WBC UR: 2 /HPF (ref 0–5)

## 2013-10-04 LAB — COMPREHENSIVE METABOLIC PANEL
Albumin: 3 g/dL — ABNORMAL LOW (ref 3.4–5.0)
Alkaline Phosphatase: 117 U/L (ref 50–136)
Anion Gap: 5 — ABNORMAL LOW (ref 7–16)
BUN: 46 mg/dL — ABNORMAL HIGH (ref 7–18)
Bilirubin,Total: 0.2 mg/dL (ref 0.2–1.0)
Calcium, Total: 9.6 mg/dL (ref 8.5–10.1)
Chloride: 104 mmol/L (ref 98–107)
Co2: 28 mmol/L (ref 21–32)
Creatinine: 0.87 mg/dL (ref 0.60–1.30)
EGFR (African American): >60
EGFR (Non-African Amer.): >60
Glucose: 113 mg/dL — ABNORMAL HIGH (ref 65–99)
Osmolality: 287 (ref 275–301)
Potassium: 3.5 mmol/L (ref 3.5–5.1)
SGOT(AST): 10 U/L — ABNORMAL LOW (ref 15–37)
SGPT (ALT): 12 U/L (ref 12–78)
Sodium: 137 mmol/L (ref 136–145)
Total Protein: 6.8 g/dL (ref 6.4–8.2)

## 2013-10-04 LAB — CBC WITH DIFFERENTIAL/PLATELET
Basophil #: 0.1 10*3/uL (ref 0.0–0.1)
Basophil %: 0.9 %
Eosinophil #: 0.1 10*3/uL (ref 0.0–0.7)
Eosinophil %: 1.3 %
HCT: 19.2 % — ABNORMAL LOW (ref 35.0–47.0)
HGB: 6.6 g/dL — ABNORMAL LOW (ref 12.0–16.0)
Lymphocyte #: 3.7 10*3/uL — ABNORMAL HIGH (ref 1.0–3.6)
Lymphocyte %: 45.6 %
MCH: 29.1 pg (ref 26.0–34.0)
MCHC: 34.1 g/dL (ref 32.0–36.0)
MCV: 85 fL (ref 80–100)
Monocyte #: 1.2 x10 3/mm — ABNORMAL HIGH (ref 0.2–0.9)
Monocyte %: 14.6 %
Neutrophil #: 3 10*3/uL (ref 1.4–6.5)
Neutrophil %: 37.6 %
Platelet: 279 10*3/uL (ref 150–440)
RBC: 2.26 10*6/uL — ABNORMAL LOW (ref 3.80–5.20)
RDW: 13.7 % (ref 11.5–14.5)
WBC: 8 10*3/uL (ref 3.6–11.0)

## 2013-10-04 LAB — CBC
HCT: 30.4 % — ABNORMAL LOW (ref 35.0–47.0)
HGB: 10.5 g/dL — ABNORMAL LOW (ref 12.0–16.0)
MCH: 28.9 pg (ref 26.0–34.0)
MCHC: 34.6 g/dL (ref 32.0–36.0)
MCV: 84 fL (ref 80–100)
Platelet: 436 10*3/uL (ref 150–440)
RBC: 3.64 10*6/uL — ABNORMAL LOW (ref 3.80–5.20)
RDW: 13.3 % (ref 11.5–14.5)
WBC: 10.2 10*3/uL (ref 3.6–11.0)

## 2013-10-04 LAB — CK TOTAL AND CKMB (NOT AT ARMC)
CK, Total: 31 U/L (ref 21–215)
CK-MB: 1.5 ng/mL (ref 0.5–3.6)

## 2013-10-04 LAB — TROPONIN I: Troponin-I: 0.1 ng/mL — ABNORMAL HIGH

## 2013-10-05 LAB — COMPREHENSIVE METABOLIC PANEL
Albumin: 2.1 g/dL — ABNORMAL LOW (ref 3.4–5.0)
Alkaline Phosphatase: 73 U/L (ref 50–136)
Anion Gap: 5 — ABNORMAL LOW (ref 7–16)
BUN: 33 mg/dL — ABNORMAL HIGH (ref 7–18)
Bilirubin,Total: 0.2 mg/dL (ref 0.2–1.0)
Calcium, Total: 7.8 mg/dL — ABNORMAL LOW (ref 8.5–10.1)
Chloride: 115 mmol/L — ABNORMAL HIGH (ref 98–107)
Co2: 24 mmol/L (ref 21–32)
Creatinine: 0.8 mg/dL (ref 0.60–1.30)
EGFR (African American): >60
EGFR (Non-African Amer.): >60
Glucose: 107 mg/dL — ABNORMAL HIGH (ref 65–99)
Osmolality: 295 (ref 275–301)
Potassium: 4.1 mmol/L (ref 3.5–5.1)
SGOT(AST): 13 U/L — ABNORMAL LOW (ref 15–37)
SGPT (ALT): 8 U/L — ABNORMAL LOW (ref 12–78)
Sodium: 144 mmol/L (ref 136–145)
Total Protein: 4.8 g/dL — ABNORMAL LOW (ref 6.4–8.2)

## 2013-10-05 LAB — CBC WITH DIFFERENTIAL/PLATELET
Basophil #: 0.1 10*3/uL (ref 0.0–0.1)
Basophil %: 0.9 %
Eosinophil #: 0.1 10*3/uL (ref 0.0–0.7)
Eosinophil %: 1.3 %
HCT: 22.7 % — ABNORMAL LOW (ref 35.0–47.0)
HGB: 7.9 g/dL — ABNORMAL LOW (ref 12.0–16.0)
Lymphocyte #: 2.6 10*3/uL (ref 1.0–3.6)
Lymphocyte %: 32.8 %
MCH: 29.4 pg (ref 26.0–34.0)
MCHC: 34.6 g/dL (ref 32.0–36.0)
MCV: 85 fL (ref 80–100)
Monocyte #: 1 x10 3/mm — ABNORMAL HIGH (ref 0.2–0.9)
Monocyte %: 13.1 %
Neutrophil #: 4.1 10*3/uL (ref 1.4–6.5)
Neutrophil %: 51.9 %
Platelet: 246 10*3/uL (ref 150–440)
RBC: 2.68 10*6/uL — ABNORMAL LOW (ref 3.80–5.20)
RDW: 13.2 % (ref 11.5–14.5)
WBC: 8 10*3/uL (ref 3.6–11.0)

## 2013-10-05 LAB — CK-MB
CK-MB: 1.1 ng/mL (ref 0.5–3.6)
CK-MB: 1.1 ng/mL (ref 0.5–3.6)

## 2013-10-05 LAB — CK: CK, Total: 23 U/L (ref 21–215)

## 2013-10-05 LAB — HEMOGLOBIN
HGB: 8 g/dL — ABNORMAL LOW (ref 12.0–16.0)
HGB: 5.8 g/dL — ABNORMAL LOW (ref 12.0–16.0)

## 2013-10-05 LAB — TROPONIN I
Troponin-I: 0.07 ng/mL — ABNORMAL HIGH
Troponin-I: 0.03 ng/mL

## 2013-10-06 DIAGNOSIS — K254 Chronic or unspecified gastric ulcer with hemorrhage: Secondary | ICD-10-CM | POA: Diagnosis not present

## 2013-10-06 DIAGNOSIS — K922 Gastrointestinal hemorrhage, unspecified: Secondary | ICD-10-CM | POA: Diagnosis not present

## 2013-10-06 DIAGNOSIS — K921 Melena: Secondary | ICD-10-CM | POA: Diagnosis not present

## 2013-10-06 LAB — BASIC METABOLIC PANEL
Anion Gap: 5 — ABNORMAL LOW (ref 7–16)
BUN: 25 mg/dL — ABNORMAL HIGH (ref 7–18)
Calcium, Total: 7.8 mg/dL — ABNORMAL LOW (ref 8.5–10.1)
Chloride: 115 mmol/L — ABNORMAL HIGH (ref 98–107)
Co2: 24 mmol/L (ref 21–32)
Creatinine: 0.8 mg/dL (ref 0.60–1.30)
EGFR (African American): >60
EGFR (Non-African Amer.): >60
Glucose: 106 mg/dL — ABNORMAL HIGH (ref 65–99)
Osmolality: 292 (ref 275–301)
Potassium: 4 mmol/L (ref 3.5–5.1)
Sodium: 144 mmol/L (ref 136–145)

## 2013-10-06 LAB — CBC WITH DIFFERENTIAL/PLATELET
Basophil #: 0.1 10*3/uL (ref 0.0–0.1)
Basophil %: 1 %
Eosinophil #: 0.1 10*3/uL (ref 0.0–0.7)
Eosinophil %: 1.5 %
HCT: 24.6 % — ABNORMAL LOW (ref 35.0–47.0)
HGB: 8.7 g/dL — ABNORMAL LOW (ref 12.0–16.0)
Lymphocyte #: 3.1 10*3/uL (ref 1.0–3.6)
Lymphocyte %: 31.7 %
MCH: 30.7 pg (ref 26.0–34.0)
MCHC: 35.5 g/dL (ref 32.0–36.0)
MCV: 87 fL (ref 80–100)
Monocyte #: 1.5 x10 3/mm — ABNORMAL HIGH (ref 0.2–0.9)
Monocyte %: 15.2 %
Neutrophil #: 5 10*3/uL (ref 1.4–6.5)
Neutrophil %: 50.6 %
Platelet: 190 10*3/uL (ref 150–440)
RBC: 2.85 10*6/uL — ABNORMAL LOW (ref 3.80–5.20)
RDW: 15.2 % — ABNORMAL HIGH (ref 11.5–14.5)
WBC: 9.9 10*3/uL (ref 3.6–11.0)

## 2013-10-06 LAB — HEMOGLOBIN: HGB: 11.3 g/dL — ABNORMAL LOW (ref 12.0–16.0)

## 2013-10-07 LAB — HEMOGLOBIN
HGB: 10.4 g/dL — ABNORMAL LOW (ref 12.0–16.0)
HGB: 10.4 g/dL — ABNORMAL LOW (ref 12.0–16.0)
HGB: 10.9 g/dL — ABNORMAL LOW (ref 12.0–16.0)

## 2013-10-08 LAB — HEMOGLOBIN: HGB: 10.5 g/dL — ABNORMAL LOW (ref 12.0–16.0)

## 2013-10-09 ENCOUNTER — Other Ambulatory Visit: Payer: Self-pay | Admitting: *Deleted

## 2013-10-09 DIAGNOSIS — D62 Acute posthemorrhagic anemia: Secondary | ICD-10-CM | POA: Diagnosis not present

## 2013-10-09 DIAGNOSIS — D509 Iron deficiency anemia, unspecified: Secondary | ICD-10-CM | POA: Diagnosis not present

## 2013-10-09 DIAGNOSIS — Z7901 Long term (current) use of anticoagulants: Secondary | ICD-10-CM | POA: Diagnosis not present

## 2013-10-09 DIAGNOSIS — K5900 Constipation, unspecified: Secondary | ICD-10-CM | POA: Diagnosis not present

## 2013-10-09 DIAGNOSIS — R279 Unspecified lack of coordination: Secondary | ICD-10-CM | POA: Diagnosis not present

## 2013-10-09 DIAGNOSIS — J329 Chronic sinusitis, unspecified: Secondary | ICD-10-CM | POA: Diagnosis not present

## 2013-10-09 DIAGNOSIS — D649 Anemia, unspecified: Secondary | ICD-10-CM | POA: Diagnosis not present

## 2013-10-09 DIAGNOSIS — D500 Iron deficiency anemia secondary to blood loss (chronic): Secondary | ICD-10-CM | POA: Diagnosis not present

## 2013-10-09 DIAGNOSIS — G894 Chronic pain syndrome: Secondary | ICD-10-CM | POA: Diagnosis not present

## 2013-10-09 DIAGNOSIS — F329 Major depressive disorder, single episode, unspecified: Secondary | ICD-10-CM | POA: Diagnosis not present

## 2013-10-09 DIAGNOSIS — R5381 Other malaise: Secondary | ICD-10-CM | POA: Diagnosis not present

## 2013-10-09 DIAGNOSIS — R262 Difficulty in walking, not elsewhere classified: Secondary | ICD-10-CM | POA: Diagnosis not present

## 2013-10-09 DIAGNOSIS — K219 Gastro-esophageal reflux disease without esophagitis: Secondary | ICD-10-CM | POA: Diagnosis not present

## 2013-10-09 DIAGNOSIS — E860 Dehydration: Secondary | ICD-10-CM | POA: Diagnosis not present

## 2013-10-09 DIAGNOSIS — K259 Gastric ulcer, unspecified as acute or chronic, without hemorrhage or perforation: Secondary | ICD-10-CM | POA: Diagnosis not present

## 2013-10-09 DIAGNOSIS — M6281 Muscle weakness (generalized): Secondary | ICD-10-CM | POA: Diagnosis not present

## 2013-10-09 DIAGNOSIS — K922 Gastrointestinal hemorrhage, unspecified: Secondary | ICD-10-CM | POA: Diagnosis not present

## 2013-10-09 DIAGNOSIS — M129 Arthropathy, unspecified: Secondary | ICD-10-CM | POA: Diagnosis not present

## 2013-10-09 DIAGNOSIS — E785 Hyperlipidemia, unspecified: Secondary | ICD-10-CM | POA: Diagnosis not present

## 2013-10-09 DIAGNOSIS — K921 Melena: Secondary | ICD-10-CM | POA: Diagnosis not present

## 2013-10-09 DIAGNOSIS — K279 Peptic ulcer, site unspecified, unspecified as acute or chronic, without hemorrhage or perforation: Secondary | ICD-10-CM | POA: Diagnosis not present

## 2013-10-09 DIAGNOSIS — I10 Essential (primary) hypertension: Secondary | ICD-10-CM | POA: Diagnosis not present

## 2013-10-09 DIAGNOSIS — Z9181 History of falling: Secondary | ICD-10-CM | POA: Diagnosis not present

## 2013-10-09 LAB — HEMOGLOBIN: HGB: 10.7 g/dL — ABNORMAL LOW (ref 12.0–16.0)

## 2013-10-09 MED ORDER — HYDROCODONE-ACETAMINOPHEN 10-325 MG PO TABS: ORAL_TABLET | ORAL | Status: DC

## 2013-10-09 NOTE — Telephone Encounter (Signed)
rx filled per protocol  

## 2013-10-10 ENCOUNTER — Non-Acute Institutional Stay (SKILLED_NURSING_FACILITY): Payer: Medicare Other | Admitting: Nurse Practitioner

## 2013-10-10 DIAGNOSIS — K922 Gastrointestinal hemorrhage, unspecified: Secondary | ICD-10-CM | POA: Diagnosis not present

## 2013-10-10 DIAGNOSIS — T801XXS Vascular complications following infusion, transfusion and therapeutic injection, sequela: Secondary | ICD-10-CM

## 2013-10-10 DIAGNOSIS — F329 Major depressive disorder, single episode, unspecified: Secondary | ICD-10-CM | POA: Diagnosis not present

## 2013-10-10 DIAGNOSIS — K5900 Constipation, unspecified: Secondary | ICD-10-CM | POA: Diagnosis not present

## 2013-10-10 DIAGNOSIS — M159 Polyosteoarthritis, unspecified: Secondary | ICD-10-CM

## 2013-10-10 DIAGNOSIS — F3289 Other specified depressive episodes: Secondary | ICD-10-CM

## 2013-10-10 DIAGNOSIS — F32A Depression, unspecified: Secondary | ICD-10-CM

## 2013-10-10 DIAGNOSIS — R5381 Other malaise: Secondary | ICD-10-CM | POA: Diagnosis not present

## 2013-10-10 DIAGNOSIS — T889XXS Complication of surgical and medical care, unspecified, sequela: Secondary | ICD-10-CM

## 2013-10-10 DIAGNOSIS — D62 Acute posthemorrhagic anemia: Secondary | ICD-10-CM

## 2013-10-14 ENCOUNTER — Non-Acute Institutional Stay (SKILLED_NURSING_FACILITY): Payer: Medicare Other | Admitting: Internal Medicine

## 2013-10-14 ENCOUNTER — Encounter: Payer: Self-pay | Admitting: Internal Medicine

## 2013-10-14 DIAGNOSIS — E785 Hyperlipidemia, unspecified: Secondary | ICD-10-CM | POA: Diagnosis not present

## 2013-10-14 DIAGNOSIS — R5381 Other malaise: Secondary | ICD-10-CM

## 2013-10-14 DIAGNOSIS — M1990 Unspecified osteoarthritis, unspecified site: Secondary | ICD-10-CM

## 2013-10-14 DIAGNOSIS — F32A Depression, unspecified: Secondary | ICD-10-CM

## 2013-10-14 DIAGNOSIS — F3289 Other specified depressive episodes: Secondary | ICD-10-CM

## 2013-10-14 DIAGNOSIS — F329 Major depressive disorder, single episode, unspecified: Secondary | ICD-10-CM

## 2013-10-14 DIAGNOSIS — M129 Arthropathy, unspecified: Secondary | ICD-10-CM | POA: Diagnosis not present

## 2013-10-14 DIAGNOSIS — R531 Weakness: Secondary | ICD-10-CM | POA: Insufficient documentation

## 2013-10-14 DIAGNOSIS — D509 Iron deficiency anemia, unspecified: Secondary | ICD-10-CM

## 2013-10-14 DIAGNOSIS — K254 Chronic or unspecified gastric ulcer with hemorrhage: Secondary | ICD-10-CM

## 2013-10-14 DIAGNOSIS — I10 Essential (primary) hypertension: Secondary | ICD-10-CM

## 2013-10-14 NOTE — Progress Notes (Signed)
Patient ID: Nancy Blackburn, female   DOB: 1946-10-15, 67 y.o.   MRN: [id number]  [facility]   PCP: Bosie Clos, MD  Code Status: DNR  Allergies  Allergen Reactions  . Clarithromycin Other (See Comments)    Upsets stomach    Chief Complaint: new admit  HPI:  67 y/o female patient is here for STR post hospital admission 10/04/13 - 10/09/13 with GI bleed. She had several units of PRBC transfusion and EGD showed a large pyloric ulcer with bleeding vessel which was clipped. She was on protonix drip and later switched to oral. Aspirin and naproxen were discontinued. She was seen in her room today with her daughter present. She denies any complaints. She feels that her energy level is improving. Her appetite is fair. No frther nausea or vomiting. dneies any frank blood in stool. Denies melena.   Review of Systems  Constitutional: Negative for fever, chills and diaphoresis.  HENT: Negative for sore throat.   Eyes: Negative for blurred vision.  Respiratory: Negative for cough, sputum production and stridor.   Cardiovascular: Negative for chest pain and leg swelling.  Gastrointestinal: Negative for heartburn, nausea, vomiting, abdominal pain, diarrhea, blood in stool and melena.  Genitourinary: Negative for dysuria.  Musculoskeletal: Negative for falls and myalgias.  Skin: Negative for rash.  Neurological: Negative for dizziness, seizures, loss of consciousness and headaches.  Psychiatric/Behavioral: Negative for depression.     Past Medical History  Diagnosis Date  . PONV (postoperative nausea and vomiting)   . Hypertension     takes Metoprolol and Lisinopril  . Hyperlipidemia     takes Simvastatin  . Palpitations     June, 2013,  Event recorder normal sinus rhythm with rare PVCs  ??patient may feel PVCs ??  . Abnormal EKG   . Low back pain   . Normal nuclear stress test     The patient had a normal stress nuclear scan in August,  2006  . Ejection fraction      EF 55-65%, echo, mild LVH, June, 2013, small pericardial effusion  . Pericardial effusion     Small, echo, June, 2013   Past Surgical History  Procedure Laterality Date  . Tonsillectomy      as child  . Tubal ligation      2yrs ago  . Thyroidectomy, partial    . Vaginal hysterectomy      55yrs ago d/t cervical cancer  . Bladder suspension  2003  . Bone spur  2009    removed from right collar bone area and partial collar bone removed  . Back surgery  08/2010   Social History:   reports that she has never smoked. She has never used smokeless tobacco. She reports that she drinks alcohol. She reports that she does not use illicit drugs.  Family History  Problem Relation Age of Onset  . Anesthesia problems Mother   . Hypotension Neg Hx   . Malignant hyperthermia Neg Hx   . Pseudochol deficiency Neg Hx     Medications: Patient's Medications  New Prescriptions   No medications on file  Previous Medications   BIOTIN PO    Take 5,000 mcg by mouth daily.    ESCITALOPRAM (LEXAPRO) 10 MG TABLET    Take 10 mg by mouth daily.   FERROUS SULFATE 325 (65 FE) MG TABLET    Take 325 mg by mouth 2 (two) times daily.   FISH OIL-OMEGA-3 FATTY ACIDS 1000 MG CAPSULE    Take  1 g by mouth daily.     HYDROCODONE-ACETAMINOPHEN (NORCO) 10-325 MG PER TABLET    Take 1 tablet by mouth 4 x a day as needed for pain **NOT TO EXCEED 4 GM TYLENOL IN 24 HOURS**   METOPROLOL (LOPRESSOR) 50 MG TABLET    Take 50 mg by mouth 2 (two) times daily.     PANTOPRAZOLE (PROTONIX) 40 MG TABLET    Take 40 mg by mouth 2 (two) times daily.   SIMVASTATIN (ZOCOR) 40 MG TABLET    Take 20 mg by mouth at bedtime.    SUCRALFATE (CARAFATE) 1 G TABLET    Take 1 g by mouth 3 (three) times daily.  Modified Medications   No medications on file  Discontinued Medications   ASPIRIN 81 MG TABLET    Take 81 mg by mouth daily.   CALCIUM CARBONATE-VITAMIN D (CALCIUM + D PO)    Take 2 tablets by mouth daily.    CHOLECALCIFEROL (VITAMIN  D) 1000 UNITS TABLET    Take 1,000 Units by mouth daily.     DIAZEPAM (VALIUM) 5 MG TABLET    Take 5 mg by mouth every 6 (six) hours as needed.   DIPHENHYDRAMINE (BENADRYL) 25 MG TABLET    Take 25 mg by mouth every 6 (six) hours as needed. For allergies     LISINOPRIL (PRINIVIL,ZESTRIL) 40 MG TABLET    Take 40 mg by mouth daily.     NAPROXEN (NAPROSYN) 500 MG TABLET    Take 500 mg by mouth 2 (two) times daily with a meal.       Physical Exam:  Filed Vitals:   10/14/13 1557  BP: 140/85  Pulse: 62  Temp: 98.2 F (36.8 C)  Resp: 18  SpO2: 97%   General- elderly female in no acute distress Head- atraumatic, normocephalic Eyes- PERRLA, EOMI, no pallor, no icterus, no discharge Neck- no lymphadenopathy, no thyromegaly, no jugular vein distension, no carotid bruit Chest- no chest wall deformities, no chest wall tenderness Cardiovascular- normal s1,s2, no murmurs/ rubs/ gallops Respiratory- bilateral clear to auscultation, no wheeze, no rhonchi, no crackles Abdomen- bowel sounds present, soft, non tender, no organomegaly, no abdominal bruits, no guarding or rigidity, no CVA tenderness Musculoskeletal- able to move all 4 extremities, no spinal and paraspinal tenderness, steady gait, no use of assistive device Neurological- no focal deficit, normal reflexes, normal muscle strength, normal sensation to fine touch and vibration Psychiatry- alert and oriented to person, place and time, normal mood and affect   Labs reviewed:  10/10/13 wbc 6.5, hb 10.6, hct 33.3, plt 369, na 141, k 3.6, bun 8, cr 0.7, ca 8.8, alb 2.7, pro 5.1, rest of lft wnl, t.chol 169, hdl 35, ldl 100, tg 170  Assessment/Plan  Pyloric ulcer- s/p clipping. To take protonix bid with carafate. Monitor cbc. Avoid NSAIDs. To see Gi in 1-2 weeks  Weakness- in setting of recent gi bleed and deconditioning from hospital stay. Will have her work with therapy team. Skin care and fall precautions. Encourage po intake. Will check  her prealbumin level to assess her nutritional state  Depression- continue escitalopram and monitor her mood  Iron def anemia- in setting of bleed. Continue iron supplement and check h/h  DJD- continue norco prn for now. Avoid NSAID  Hyperlipidemia- continue zocor, reviewed lipid panel  Family/ staff Communication: reviewed care plan with patient, her daughter and nursing supervisor   Goals of care: to return home   Labs/tests ordered- cbc, prealbumin

## 2013-10-22 ENCOUNTER — Encounter: Payer: Self-pay | Admitting: Nurse Practitioner

## 2013-10-22 DIAGNOSIS — K279 Peptic ulcer, site unspecified, unspecified as acute or chronic, without hemorrhage or perforation: Secondary | ICD-10-CM | POA: Diagnosis not present

## 2013-10-29 ENCOUNTER — Non-Acute Institutional Stay (SKILLED_NURSING_FACILITY): Payer: Medicare Other | Admitting: Nurse Practitioner

## 2013-10-29 DIAGNOSIS — R5381 Other malaise: Secondary | ICD-10-CM

## 2013-10-29 DIAGNOSIS — J329 Chronic sinusitis, unspecified: Secondary | ICD-10-CM | POA: Diagnosis not present

## 2013-10-29 DIAGNOSIS — K922 Gastrointestinal hemorrhage, unspecified: Secondary | ICD-10-CM

## 2013-10-29 DIAGNOSIS — Z7901 Long term (current) use of anticoagulants: Secondary | ICD-10-CM | POA: Diagnosis not present

## 2013-10-29 DIAGNOSIS — F329 Major depressive disorder, single episode, unspecified: Secondary | ICD-10-CM | POA: Diagnosis not present

## 2013-10-29 DIAGNOSIS — F32A Depression, unspecified: Secondary | ICD-10-CM

## 2013-10-29 DIAGNOSIS — K5900 Constipation, unspecified: Secondary | ICD-10-CM | POA: Diagnosis not present

## 2013-10-29 DIAGNOSIS — D649 Anemia, unspecified: Secondary | ICD-10-CM | POA: Diagnosis not present

## 2013-10-29 DIAGNOSIS — I10 Essential (primary) hypertension: Secondary | ICD-10-CM

## 2013-11-08 DIAGNOSIS — I10 Essential (primary) hypertension: Secondary | ICD-10-CM | POA: Diagnosis not present

## 2013-11-08 DIAGNOSIS — R269 Unspecified abnormalities of gait and mobility: Secondary | ICD-10-CM | POA: Diagnosis not present

## 2013-11-08 DIAGNOSIS — K279 Peptic ulcer, site unspecified, unspecified as acute or chronic, without hemorrhage or perforation: Secondary | ICD-10-CM | POA: Diagnosis not present

## 2013-11-08 DIAGNOSIS — F329 Major depressive disorder, single episode, unspecified: Secondary | ICD-10-CM | POA: Diagnosis not present

## 2013-11-08 DIAGNOSIS — D649 Anemia, unspecified: Secondary | ICD-10-CM | POA: Diagnosis not present

## 2013-11-12 DIAGNOSIS — F329 Major depressive disorder, single episode, unspecified: Secondary | ICD-10-CM | POA: Diagnosis not present

## 2013-11-12 DIAGNOSIS — K279 Peptic ulcer, site unspecified, unspecified as acute or chronic, without hemorrhage or perforation: Secondary | ICD-10-CM | POA: Diagnosis not present

## 2013-11-12 DIAGNOSIS — R269 Unspecified abnormalities of gait and mobility: Secondary | ICD-10-CM | POA: Diagnosis not present

## 2013-11-12 DIAGNOSIS — D649 Anemia, unspecified: Secondary | ICD-10-CM | POA: Diagnosis not present

## 2013-11-12 DIAGNOSIS — I10 Essential (primary) hypertension: Secondary | ICD-10-CM | POA: Diagnosis not present

## 2013-11-20 DIAGNOSIS — D649 Anemia, unspecified: Secondary | ICD-10-CM | POA: Diagnosis not present

## 2013-11-20 DIAGNOSIS — F329 Major depressive disorder, single episode, unspecified: Secondary | ICD-10-CM | POA: Diagnosis not present

## 2013-11-20 DIAGNOSIS — M6281 Muscle weakness (generalized): Secondary | ICD-10-CM | POA: Diagnosis not present

## 2013-11-20 DIAGNOSIS — I10 Essential (primary) hypertension: Secondary | ICD-10-CM | POA: Diagnosis not present

## 2013-11-20 DIAGNOSIS — K279 Peptic ulcer, site unspecified, unspecified as acute or chronic, without hemorrhage or perforation: Secondary | ICD-10-CM | POA: Diagnosis not present

## 2013-11-20 DIAGNOSIS — R269 Unspecified abnormalities of gait and mobility: Secondary | ICD-10-CM | POA: Diagnosis not present

## 2013-11-26 DIAGNOSIS — M6281 Muscle weakness (generalized): Secondary | ICD-10-CM | POA: Diagnosis not present

## 2013-11-26 DIAGNOSIS — G4730 Sleep apnea, unspecified: Secondary | ICD-10-CM | POA: Diagnosis not present

## 2013-11-26 DIAGNOSIS — D649 Anemia, unspecified: Secondary | ICD-10-CM | POA: Diagnosis not present

## 2013-11-26 DIAGNOSIS — K625 Hemorrhage of anus and rectum: Secondary | ICD-10-CM | POA: Diagnosis not present

## 2013-11-26 DIAGNOSIS — I10 Essential (primary) hypertension: Secondary | ICD-10-CM | POA: Diagnosis not present

## 2013-11-26 DIAGNOSIS — F329 Major depressive disorder, single episode, unspecified: Secondary | ICD-10-CM | POA: Diagnosis not present

## 2013-12-20 NOTE — Progress Notes (Signed)
This encounter was created in error - please disregard.

## 2013-12-20 NOTE — Progress Notes (Signed)
Date of Visit 10/10/2013 [facility]  Patient ID: [name], female   DOB: [birth year], 68 y.o.   MRN: [id number]  Allergies  Allergen Reactions  . Clarithromycin Other (See Comments)    Upsets stomach   Chief Complaint  Patient presents with  . Hospitalization Follow-up   HPI: Pt is a 68 year old Caucasian female s/p hospitalization for a bleeding pyloric ulcer.  During her hospitalization she required 2 units of PRBC's.   Pt reports dealing with depression and pain from DJD.  States she was eating very little and taking Naproxen 500 mg twice a day.  Had abdominal pain, was seen in the ER, and at that point, had the bleed.   Her main complaint today is constipation.       Review of Systems  Constitutional: Negative for fever, chills and diaphoresis.  HENT: Negative for sore throat.   Eyes: Negative for blurred vision.  Respiratory: Negative for cough, sputum production and stridor.   Cardiovascular: Negative for chest pain and leg swelling.  Gastrointestinal: Negative for heartburn, nausea, vomiting, abdominal pain, diarrhea, blood in stool and melena. Positive for constipation Genitourinary: Negative for dysuria.  Musculoskeletal: Negative for falls and myalgias.  Skin: Negative for rash.  Neurological: Negative for dizziness, seizures, loss of consciousness and headaches.  Psychiatric/Behavioral: Negative for depression.      Past Medical History   Diagnosis  Date   .  PONV (postoperative nausea and vomiting)     .  Hypertension         takes Metoprolol and Lisinopril   .  Hyperlipidemia         takes Simvastatin   .  Palpitations         June, 2013,  Event recorder normal sinus rhythm with rare PVCs  ??patient may feel PVCs ??   .  Abnormal EKG     .  Low back pain     .  Normal nuclear stress test         The patient had a normal stress nuclear scan in August,  2006   .  Ejection fraction         EF 55-65%, echo, mild LVH, June, 2013, small  pericardial effusion   .  Pericardial effusion         Small, echo, June, 2013      Past Surgical History   Procedure  Laterality  Date   .  Tonsillectomy           as child   .  Tubal ligation           75yrs ago   .  Thyroidectomy, partial       .  Vaginal hysterectomy           28yrs ago d/t cervical cancer   .  Bladder suspension    2003   .  Bone spur    2009       removed from right collar bone area and partial collar bone removed   .  Back surgery    08/2010    Social History:   reports that she has never smoked. She has never used smokeless tobacco. She reports that she drinks alcohol. She reports that she does not use illicit drugs.    Family History   Problem  Relation  Age of Onset   .  Anesthesia problems  Mother     .  Hypotension  Neg Hx     .  Malignant hyperthermia  Neg Hx     .  Pseudochol deficiency  Neg Hx       Medications: Patient's Medications   New Prescriptions     No medications on file   Previous Medications     BIOTIN PO     Take 5,000 mcg by mouth daily.      ESCITALOPRAM (LEXAPRO) 10 MG TABLET     Take 10 mg by mouth daily.     FERROUS SULFATE 325 (65 FE) MG TABLET     Take 325 mg by mouth 2 (two) times daily.     FISH OIL-OMEGA-3 FATTY ACIDS 1000 MG CAPSULE     Take 1 g by mouth daily.       HYDROCODONE-ACETAMINOPHEN (NORCO) 10-325 MG PER TABLET     Take 1 tablet by mouth 4 x a day as needed for pain **NOT TO EXCEED 4 GM TYLENOL IN 24 HOURS**     METOPROLOL (LOPRESSOR) 50 MG TABLET     Take 50 mg by mouth 2 (two) times daily.       PANTOPRAZOLE (PROTONIX) 40 MG TABLET     Take 40 mg by mouth 2 (two) times daily.     SIMVASTATIN (ZOCOR) 40 MG TABLET     Take 20 mg by mouth at bedtime.      SUCRALFATE (CARAFATE) 1 G TABLET     Take 1 g by mouth 3 (three) times daily.   Modified Medications     No medications on file   Discontinued Medications     ASPIRIN 81 MG TABLET     Take 81 mg by mouth daily.     CALCIUM CARBONATE-VITAMIN D (CALCIUM + D  PO)     Take 2 tablets by mouth daily.      CHOLECALCIFEROL (VITAMIN D) 1000 UNITS TABLET     Take 1,000 Units by mouth daily.       DIAZEPAM (VALIUM) 5 MG TABLET     Take 5 mg by mouth every 6 (six) hours as needed.     DIPHENHYDRAMINE (BENADRYL) 25 MG TABLET     Take 25 mg by mouth every 6 (six) hours as needed. For allergies       LISINOPRIL (PRINIVIL,ZESTRIL) 40 MG TABLET     Take 40 mg by mouth daily.       NAPROXEN (NAPROSYN) 500 MG TABLET     Take 500 mg by mouth 2 (two) times daily with a meal.       Labs reviewed:  10/10/13 wbc 6.5, hb 10.6, hct 33.3, plt 369, na 141, k 3.6, bun 8, cr 0.7, ca 8.8, alb 2.7, pro 5.1, rest of lft wnl, t.chol 169, hdl 35, ldl 100, tg 170   Vital signs: BP 138/82 Pulse 82 Temp 98.1 RR 16 Pulse Ox 97%  Physical Examination:  Pt is alert, verbally appropriate, in no apparent distress.  Oriented x 3.    Head is normocephalic, atraumatic   Eyes- PERRLA, EOMI, no pallor, no icterus, no discharge  TM's unremarkable  Oral pharynx, teeth are in good repair, no gross oral lesions  Neck- no palpable cervical lymphadenopathy, or thyromegaly, no  Apparent carotid bruit  Cardiovascular- Regular rate and rhythm  Respiratory- bilateral clear to auscultation, no wheeze, no rhonchi, no crackles  Abdomen- bowel sounds present, soft and nontender  Musculoskeletal- able to move all 4 extremities, no spinal and paraspinal tenderness, steady gait, no use of assistive device.  Easily palpable pedal dorsalis pulses.  Inner aspect of L  Forearm area of erythema, IV site, with some edema, no red streaks.  Radial and ulnar pulses easily palpable.  Sensation intact, mild tenderness with palpation.  Assessment/Plan  Weakness- in setting of recent gi bleed and deconditioning from hospital stay. Pt will work with PT/OT for mobility, strengthening, safety, and adaptation of ADL's    Pyloric ulcer, will continue with protonix twice a day and carafate.  NSAIDS,  likely will never be an option for this pt, she will have ongoing f/u with her gastroenterologist.   Inner L forearm area is entirely consistent with superficial phebilitis.  Recommend use of cold compacts to address the edema.  No limitations r/t use of the arm.  No concern at this time r/t any DVT.  Will continue to monitor.     Constipation, becomes a issue with the use of carafate.  Will have to be aggressive with use of dulcolax tablets and suppositories.  Recommend pt not going any longer than 2-3 days without a bowel movement.  Will continue Mrialax 17 gm in fluid of choice.  Very important that pt maintain good fluid intake, approx 64 ounces per day.   Pt is not diabetic, and with constipation apple and prune juice would be helpful. Will continue to monitor   Depression- continue escitalopram will continue to monitor  Iron def anemia- in setting of bleed. Continue appropriate supplements, and continue to monitor.    DJD- continue Vicodin prn for now. NSAIDs are not an option for this pt  Hyperlipidemia- continue zocor, Will be managed by pt's primary care provider after her discharge.

## 2013-12-22 NOTE — Progress Notes (Signed)
Date of Visit 10/10/2013 [facility]  Patient ID: Nancy Blackburn, female   DOB: 11/06/1946, 68 y.o.   MRN: [id number]  Allergies  Allergen Reactions  . Clarithromycin Other (See Comments)    Upsets stomach   Chief Complaint  Patient presents with  . Discharge Note   HPI: Pt is a 68 year old Caucasian female s/p hospitalization for a bleeding pyloric ulcer.  During her hospitalization she required 2 units of PRBC's.   Pt reports dealing with depression and pain from DJD.  States she was eating very little and taking Naproxen 500 mg twice a day.  Had abdominal pain, was seen in the ER, and at that point, had the bleed.   Her main complaint today is constipation.       Review of Systems  Constitutional: Negative for fever, chills and diaphoresis.  HENT: Negative for sore throat.   Eyes: Negative for blurred vision.  Respiratory: Negative for cough, sputum production and stridor.   Cardiovascular: Negative for chest pain and leg swelling.  Gastrointestinal: Negative for heartburn, nausea, vomiting, abdominal pain, diarrhea, blood in stool and melena. Positive for constipation Genitourinary: Negative for dysuria.  Musculoskeletal: Negative for falls and myalgias.  Skin: Negative for rash.  Neurological: Negative for dizziness, seizures, loss of consciousness and headaches.  Psychiatric/Behavioral: Negative for depression.      Past Medical History   Diagnosis  Date   .  PONV (postoperative nausea and vomiting)     .  Hypertension         takes Metoprolol and Lisinopril   .  Hyperlipidemia         takes Simvastatin   .  Palpitations         June, 2013,  Event recorder normal sinus rhythm with rare PVCs  ??patient may feel PVCs ??   .  Abnormal EKG     .  Low back pain     .  Normal nuclear stress test         The patient had a normal stress nuclear scan in August,  2006   .  Ejection fraction         EF 55-65%, echo, mild LVH, June, 2013, small pericardial  effusion   .  Pericardial effusion         Small, echo, June, 2013      Past Surgical History   Procedure  Laterality  Date   .  Tonsillectomy           as child   .  Tubal ligation           56yrs ago   .  Thyroidectomy, partial       .  Vaginal hysterectomy           48yrs ago d/t cervical cancer   .  Bladder suspension    2003   .  Bone spur    2009       removed from right collar bone area and partial collar bone removed   .  Back surgery    08/2010    Social History:   reports that she has never smoked. She has never used smokeless tobacco. She reports that she drinks alcohol. She reports that she does not use illicit drugs.    Family History   Problem  Relation  Age of Onset   .  Anesthesia problems  Mother     .  Hypotension  Neg Hx     .  Malignant hyperthermia  Neg Hx     .  Pseudochol deficiency  Neg Hx       Medications: Patient's Medications   New Prescriptions     No medications on file   Previous Medications     BIOTIN PO     Take 5,000 mcg by mouth daily.      ESCITALOPRAM (LEXAPRO) 10 MG TABLET     Take 10 mg by mouth daily.     FERROUS SULFATE 325 (65 FE) MG TABLET     Take 325 mg by mouth 2 (two) times daily.     FISH OIL-OMEGA-3 FATTY ACIDS 1000 MG CAPSULE     Take 1 g by mouth daily.       HYDROCODONE-ACETAMINOPHEN (NORCO) 10-325 MG PER TABLET     Take 1 tablet by mouth 4 x a day as needed for pain **NOT TO EXCEED 4 GM TYLENOL IN 24 HOURS**     METOPROLOL (LOPRESSOR) 50 MG TABLET     Take 50 mg by mouth 2 (two) times daily.       PANTOPRAZOLE (PROTONIX) 40 MG TABLET     Take 40 mg by mouth 2 (two) times daily.     SIMVASTATIN (ZOCOR) 40 MG TABLET     Take 20 mg by mouth at bedtime.      SUCRALFATE (CARAFATE) 1 G TABLET     Take 1 g by mouth 3 (three) times daily.   Modified Medications     No medications on file   Discontinued Medications     ASPIRIN 81 MG TABLET     Take 81 mg by mouth daily.     CALCIUM CARBONATE-VITAMIN D (CALCIUM + D PO)     Take  2 tablets by mouth daily.      CHOLECALCIFEROL (VITAMIN D) 1000 UNITS TABLET     Take 1,000 Units by mouth daily.       DIAZEPAM (VALIUM) 5 MG TABLET     Take 5 mg by mouth every 6 (six) hours as needed.     DIPHENHYDRAMINE (BENADRYL) 25 MG TABLET     Take 25 mg by mouth every 6 (six) hours as needed. For allergies       LISINOPRIL (PRINIVIL,ZESTRIL) 40 MG TABLET     Take 40 mg by mouth daily.       NAPROXEN (NAPROSYN) 500 MG TABLET     Take 500 mg by mouth 2 (two) times daily with a meal.       Labs reviewed:  10/10/13 wbc 6.5, hb 10.6, hct 33.3, plt 369, na 141, k 3.6, bun 8, cr 0.7, ca 8.8, alb 2.7, pro 5.1, rest of lft wnl, t.chol 169, hdl 35, ldl 100, tg 170  Labs from 10/21/2013 WBC 9.4 RBC 4.1 Hemoglobin 11.5 Hematocrit 37.3 MCV 91.2 MCH 28.1 MCHC 30.8 RDW 15.4 Platelets 474  Sodium 138 Potassium 3.7 Chloride 103 CO2 29 AGAP 7 Glucose 99 BUN 9 Creatinine 0.8 Calcium 9.1   Vital signs: BP 141/82 Pulse 78 Temp 98.2 Pulse Ox 99% RR 18 Pulse Ox 98%  Physical Examination:  Pt is alert, verbally appropriate, in no apparent distress.  Oriented x 3.    Head is normocephalic, atraumatic   Eyes- PERRLA, EOMI, no pallor, no icterus, no discharge  TM's unremarkable  Oral pharynx, teeth are in good repair, no gross oral lesions  Neck- no palpable cervical lymphadenopathy, or thyromegaly, no  Apparent carotid bruit  Cardiovascular- Regular rate and rhythm  Respiratory-  bilateral clear to auscultation, no wheeze, no rhonchi, no crackles  Abdomen- bowel sounds present, soft and nontender  Musculoskeletal- able to move all 4 extremities, no spinal and paraspinal tenderness, steady gait, no use of assistive device.  Easily palpable pedal dorsalis pulses.  Inner aspect of L  Forearm area with erythema, significantly improved, almost resolved..  Radial and ulnar pulses easily palpable.  Sensation intact, slight tenderness with palpation.  Assessment/Plan  Pt  has done well with inpatient PT/OT and certainly will be ok for D/C.  Will continue [facility] services with PT/OT for mobility, strengthening, safety, and adaptation of ADL's    Pyloric ulcer, will continue with protonix twice a day and carafate.  Pt will have ongoing f/u with her gastroenterologist.   Pt and her daughter know that pt can not take any NSAIDS unless cleared by her  gastroenterologist.   Inner L forearm area is entirely consistent with superficial phebilitis.  Much improved. Will be monitored by her PCP.  Constipation, becomes a issue with the use of carafate.  Will have to be aggressive with use of dulcolax tablets and suppositories.  Recommend pt not going any longer than 2-3 days without a bowel movement.  Will continue Mrialax 17 gm in fluid of choice.  Very important that pt maintain good fluid intake, approx 64 ounces per day.   Pt is not diabetic, and with constipation apple and prune juice would be helpful. Will continue to monitor   Hypertension, pt's pre-hospitalization dose of Lopressor at 75 mg twice a day.  Now at 50 mg twice a day.  Pt has had some systolic pressures greater than 140-150, pulse rates have been in the 70-80's,, will increase lopressor to 75 mg per day, diltiazem has not been restarted. Will be monitored by her PCP  Iron def anemia-  Hemoglobin and hematocrit are normalizing, future supplementation  Will be determined by her gastroenterologist.    DJD- continue Vicodin prn for now. NSAIDs are not an option for this pt  Hyperlipidemia- continue zocor, Will be managed by pt's primary care provider after her discharge.    Pt will also have assistance from her daughters.  No contraindications for d/c.

## 2013-12-30 DIAGNOSIS — Z1331 Encounter for screening for depression: Secondary | ICD-10-CM | POA: Diagnosis not present

## 2013-12-30 DIAGNOSIS — F3289 Other specified depressive episodes: Secondary | ICD-10-CM | POA: Diagnosis not present

## 2013-12-30 DIAGNOSIS — D649 Anemia, unspecified: Secondary | ICD-10-CM | POA: Diagnosis not present

## 2013-12-30 DIAGNOSIS — I10 Essential (primary) hypertension: Secondary | ICD-10-CM | POA: Diagnosis not present

## 2013-12-30 DIAGNOSIS — F329 Major depressive disorder, single episode, unspecified: Secondary | ICD-10-CM | POA: Diagnosis not present

## 2013-12-30 DIAGNOSIS — M129 Arthropathy, unspecified: Secondary | ICD-10-CM | POA: Diagnosis not present

## 2013-12-30 DIAGNOSIS — M6281 Muscle weakness (generalized): Secondary | ICD-10-CM | POA: Diagnosis not present

## 2013-12-30 DIAGNOSIS — Z1342 Encounter for screening for global developmental delays (milestones): Secondary | ICD-10-CM | POA: Diagnosis not present

## 2013-12-30 DIAGNOSIS — E7800 Pure hypercholesterolemia, unspecified: Secondary | ICD-10-CM | POA: Diagnosis not present

## 2013-12-30 DIAGNOSIS — Z1330 Encounter for screening examination for mental health and behavioral disorders, unspecified: Secondary | ICD-10-CM | POA: Diagnosis not present

## 2014-01-10 ENCOUNTER — Ambulatory Visit: Payer: Self-pay | Admitting: Gastroenterology

## 2014-01-10 DIAGNOSIS — Z8711 Personal history of peptic ulcer disease: Secondary | ICD-10-CM | POA: Diagnosis not present

## 2014-01-10 DIAGNOSIS — K2990 Gastroduodenitis, unspecified, without bleeding: Secondary | ICD-10-CM | POA: Diagnosis not present

## 2014-01-10 DIAGNOSIS — M19019 Primary osteoarthritis, unspecified shoulder: Secondary | ICD-10-CM | POA: Diagnosis not present

## 2014-01-10 DIAGNOSIS — Z79899 Other long term (current) drug therapy: Secondary | ICD-10-CM | POA: Diagnosis not present

## 2014-01-10 DIAGNOSIS — R51 Headache: Secondary | ICD-10-CM | POA: Diagnosis not present

## 2014-01-10 DIAGNOSIS — F3289 Other specified depressive episodes: Secondary | ICD-10-CM | POA: Diagnosis not present

## 2014-01-10 DIAGNOSIS — I10 Essential (primary) hypertension: Secondary | ICD-10-CM | POA: Diagnosis not present

## 2014-01-10 DIAGNOSIS — M1710 Unilateral primary osteoarthritis, unspecified knee: Secondary | ICD-10-CM | POA: Diagnosis not present

## 2014-01-10 DIAGNOSIS — K319 Disease of stomach and duodenum, unspecified: Secondary | ICD-10-CM | POA: Diagnosis not present

## 2014-01-10 DIAGNOSIS — F411 Generalized anxiety disorder: Secondary | ICD-10-CM | POA: Diagnosis not present

## 2014-01-10 DIAGNOSIS — K2970 Gastritis, unspecified, without bleeding: Secondary | ICD-10-CM | POA: Diagnosis not present

## 2014-01-10 DIAGNOSIS — E039 Hypothyroidism, unspecified: Secondary | ICD-10-CM | POA: Diagnosis not present

## 2014-02-03 ENCOUNTER — Ambulatory Visit: Payer: Self-pay | Admitting: Family Medicine

## 2014-02-03 DIAGNOSIS — J329 Chronic sinusitis, unspecified: Secondary | ICD-10-CM | POA: Diagnosis not present

## 2014-02-03 DIAGNOSIS — J984 Other disorders of lung: Secondary | ICD-10-CM | POA: Diagnosis not present

## 2014-02-03 DIAGNOSIS — M6281 Muscle weakness (generalized): Secondary | ICD-10-CM | POA: Diagnosis not present

## 2014-02-03 DIAGNOSIS — F329 Major depressive disorder, single episode, unspecified: Secondary | ICD-10-CM | POA: Diagnosis not present

## 2014-02-03 DIAGNOSIS — J209 Acute bronchitis, unspecified: Secondary | ICD-10-CM | POA: Diagnosis not present

## 2014-02-03 DIAGNOSIS — D649 Anemia, unspecified: Secondary | ICD-10-CM | POA: Diagnosis not present

## 2014-02-03 DIAGNOSIS — Z1330 Encounter for screening examination for mental health and behavioral disorders, unspecified: Secondary | ICD-10-CM | POA: Diagnosis not present

## 2014-02-03 DIAGNOSIS — R05 Cough: Secondary | ICD-10-CM | POA: Diagnosis not present

## 2014-02-03 DIAGNOSIS — R059 Cough, unspecified: Secondary | ICD-10-CM | POA: Diagnosis not present

## 2014-02-03 DIAGNOSIS — Z1331 Encounter for screening for depression: Secondary | ICD-10-CM | POA: Diagnosis not present

## 2014-02-03 DIAGNOSIS — F3289 Other specified depressive episodes: Secondary | ICD-10-CM | POA: Diagnosis not present

## 2014-02-26 DIAGNOSIS — K259 Gastric ulcer, unspecified as acute or chronic, without hemorrhage or perforation: Secondary | ICD-10-CM | POA: Diagnosis not present

## 2014-03-18 DIAGNOSIS — I10 Essential (primary) hypertension: Secondary | ICD-10-CM | POA: Diagnosis not present

## 2014-03-18 DIAGNOSIS — M6281 Muscle weakness (generalized): Secondary | ICD-10-CM | POA: Diagnosis not present

## 2014-03-18 DIAGNOSIS — F3289 Other specified depressive episodes: Secondary | ICD-10-CM | POA: Diagnosis not present

## 2014-03-18 DIAGNOSIS — G4730 Sleep apnea, unspecified: Secondary | ICD-10-CM | POA: Diagnosis not present

## 2014-03-18 DIAGNOSIS — Z1330 Encounter for screening examination for mental health and behavioral disorders, unspecified: Secondary | ICD-10-CM | POA: Diagnosis not present

## 2014-03-18 DIAGNOSIS — Z1331 Encounter for screening for depression: Secondary | ICD-10-CM | POA: Diagnosis not present

## 2014-03-18 DIAGNOSIS — M129 Arthropathy, unspecified: Secondary | ICD-10-CM | POA: Diagnosis not present

## 2014-03-18 DIAGNOSIS — F411 Generalized anxiety disorder: Secondary | ICD-10-CM | POA: Diagnosis not present

## 2014-03-18 DIAGNOSIS — F329 Major depressive disorder, single episode, unspecified: Secondary | ICD-10-CM | POA: Diagnosis not present

## 2014-03-18 DIAGNOSIS — Z1342 Encounter for screening for global developmental delays (milestones): Secondary | ICD-10-CM | POA: Diagnosis not present

## 2014-04-09 DIAGNOSIS — M549 Dorsalgia, unspecified: Secondary | ICD-10-CM | POA: Diagnosis not present

## 2014-04-09 DIAGNOSIS — I10 Essential (primary) hypertension: Secondary | ICD-10-CM | POA: Diagnosis not present

## 2014-04-22 DIAGNOSIS — M6281 Muscle weakness (generalized): Secondary | ICD-10-CM | POA: Diagnosis not present

## 2014-04-22 DIAGNOSIS — Z1330 Encounter for screening examination for mental health and behavioral disorders, unspecified: Secondary | ICD-10-CM | POA: Diagnosis not present

## 2014-04-22 DIAGNOSIS — G250 Essential tremor: Secondary | ICD-10-CM | POA: Diagnosis not present

## 2014-04-22 DIAGNOSIS — G252 Other specified forms of tremor: Secondary | ICD-10-CM | POA: Diagnosis not present

## 2014-04-22 DIAGNOSIS — R55 Syncope and collapse: Secondary | ICD-10-CM | POA: Diagnosis not present

## 2014-04-22 DIAGNOSIS — E871 Hypo-osmolality and hyponatremia: Secondary | ICD-10-CM | POA: Diagnosis not present

## 2014-04-22 DIAGNOSIS — W19XXXA Unspecified fall, initial encounter: Secondary | ICD-10-CM | POA: Diagnosis not present

## 2014-04-22 DIAGNOSIS — Z1331 Encounter for screening for depression: Secondary | ICD-10-CM | POA: Diagnosis not present

## 2014-04-22 DIAGNOSIS — F3289 Other specified depressive episodes: Secondary | ICD-10-CM | POA: Diagnosis not present

## 2014-04-22 DIAGNOSIS — F329 Major depressive disorder, single episode, unspecified: Secondary | ICD-10-CM | POA: Diagnosis not present

## 2014-04-23 DIAGNOSIS — E785 Hyperlipidemia, unspecified: Secondary | ICD-10-CM | POA: Diagnosis not present

## 2014-04-23 DIAGNOSIS — I10 Essential (primary) hypertension: Secondary | ICD-10-CM | POA: Diagnosis not present

## 2014-05-01 DIAGNOSIS — Z1330 Encounter for screening examination for mental health and behavioral disorders, unspecified: Secondary | ICD-10-CM | POA: Diagnosis not present

## 2014-05-01 DIAGNOSIS — I10 Essential (primary) hypertension: Secondary | ICD-10-CM | POA: Diagnosis not present

## 2014-05-01 DIAGNOSIS — Z1331 Encounter for screening for depression: Secondary | ICD-10-CM | POA: Diagnosis not present

## 2014-05-01 DIAGNOSIS — D649 Anemia, unspecified: Secondary | ICD-10-CM | POA: Diagnosis not present

## 2014-05-01 DIAGNOSIS — Z1342 Encounter for screening for global developmental delays (milestones): Secondary | ICD-10-CM | POA: Diagnosis not present

## 2014-05-01 DIAGNOSIS — R55 Syncope and collapse: Secondary | ICD-10-CM | POA: Diagnosis not present

## 2014-05-01 DIAGNOSIS — E7800 Pure hypercholesterolemia, unspecified: Secondary | ICD-10-CM | POA: Diagnosis not present

## 2014-05-01 DIAGNOSIS — K922 Gastrointestinal hemorrhage, unspecified: Secondary | ICD-10-CM | POA: Diagnosis not present

## 2014-05-01 DIAGNOSIS — M6281 Muscle weakness (generalized): Secondary | ICD-10-CM | POA: Diagnosis not present

## 2014-09-04 DIAGNOSIS — F411 Generalized anxiety disorder: Secondary | ICD-10-CM | POA: Diagnosis not present

## 2014-09-04 DIAGNOSIS — Z23 Encounter for immunization: Secondary | ICD-10-CM | POA: Diagnosis not present

## 2014-09-04 DIAGNOSIS — K2100 Gastro-esophageal reflux disease with esophagitis, without bleeding: Secondary | ICD-10-CM | POA: Diagnosis not present

## 2014-09-04 DIAGNOSIS — M6281 Muscle weakness (generalized): Secondary | ICD-10-CM | POA: Diagnosis not present

## 2014-09-04 DIAGNOSIS — Z1331 Encounter for screening for depression: Secondary | ICD-10-CM | POA: Diagnosis not present

## 2014-09-04 DIAGNOSIS — R55 Syncope and collapse: Secondary | ICD-10-CM | POA: Diagnosis not present

## 2014-09-04 DIAGNOSIS — F320 Major depressive disorder, single episode, mild: Secondary | ICD-10-CM | POA: Diagnosis not present

## 2014-09-04 DIAGNOSIS — K922 Gastrointestinal hemorrhage, unspecified: Secondary | ICD-10-CM | POA: Diagnosis not present

## 2014-09-04 DIAGNOSIS — Z1330 Encounter for screening examination for mental health and behavioral disorders, unspecified: Secondary | ICD-10-CM | POA: Diagnosis not present

## 2014-10-03 DIAGNOSIS — F331 Major depressive disorder, recurrent, moderate: Secondary | ICD-10-CM | POA: Diagnosis not present

## 2014-10-03 DIAGNOSIS — F5101 Primary insomnia: Secondary | ICD-10-CM | POA: Diagnosis not present

## 2014-10-08 DIAGNOSIS — H2512 Age-related nuclear cataract, left eye: Secondary | ICD-10-CM | POA: Diagnosis not present

## 2014-10-17 DIAGNOSIS — F331 Major depressive disorder, recurrent, moderate: Secondary | ICD-10-CM | POA: Diagnosis not present

## 2014-10-24 DIAGNOSIS — R399 Unspecified symptoms and signs involving the genitourinary system: Secondary | ICD-10-CM | POA: Diagnosis not present

## 2014-10-28 ENCOUNTER — Ambulatory Visit: Payer: Self-pay | Admitting: Family Medicine

## 2014-10-28 DIAGNOSIS — R509 Fever, unspecified: Secondary | ICD-10-CM | POA: Diagnosis not present

## 2014-10-28 DIAGNOSIS — N390 Urinary tract infection, site not specified: Secondary | ICD-10-CM | POA: Diagnosis not present

## 2014-10-28 DIAGNOSIS — R6883 Chills (without fever): Secondary | ICD-10-CM | POA: Diagnosis not present

## 2014-10-28 LAB — RENAL FUNCTION PANEL
Albumin: 3.6 g/dL (ref 3.4–5.0)
Anion Gap: 0 — ABNORMAL LOW (ref 7–16)
BUN: 16 mg/dL (ref 7–18)
Calcium, Total: 9.1 mg/dL (ref 8.5–10.1)
Chloride: 102 mmol/L (ref 98–107)
Co2: 34 mmol/L — ABNORMAL HIGH (ref 21–32)
Creatinine: 1.28 mg/dL (ref 0.60–1.30)
EGFR (African American): 53 — ABNORMAL LOW
EGFR (Non-African Amer.): 44 — ABNORMAL LOW
Glucose: 107 mg/dL — ABNORMAL HIGH (ref 65–99)
Osmolality: 274 (ref 275–301)
Phosphorus: 3.3 mg/dL (ref 2.5–4.9)
Potassium: 4.1 mmol/L (ref 3.5–5.1)
Sodium: 136 mmol/L (ref 136–145)

## 2014-10-28 LAB — CBC WITH DIFFERENTIAL/PLATELET
Basophil #: 0.1 10*3/uL (ref 0.0–0.1)
Basophil %: 1.8 %
Eosinophil #: 0.4 10*3/uL (ref 0.0–0.7)
Eosinophil %: 4.9 %
HCT: 44.7 % (ref 35.0–47.0)
HGB: 14.7 g/dL (ref 12.0–16.0)
Lymphocyte #: 3.3 10*3/uL (ref 1.0–3.6)
Lymphocyte %: 40.4 %
MCH: 29.5 pg (ref 26.0–34.0)
MCHC: 32.9 g/dL (ref 32.0–36.0)
MCV: 90 fL (ref 80–100)
Monocyte #: 1.2 x10 3/mm — ABNORMAL HIGH (ref 0.2–0.9)
Monocyte %: 14.8 %
Neutrophil #: 3.1 10*3/uL (ref 1.4–6.5)
Neutrophil %: 38.1 %
Platelet: 258 10*3/uL (ref 150–440)
RBC: 4.98 10*6/uL (ref 3.80–5.20)
RDW: 12.2 % (ref 11.5–14.5)
WBC: 8.3 10*3/uL (ref 3.6–11.0)

## 2014-10-29 DIAGNOSIS — R3911 Hesitancy of micturition: Secondary | ICD-10-CM | POA: Diagnosis not present

## 2014-10-29 DIAGNOSIS — N399 Disorder of urinary system, unspecified: Secondary | ICD-10-CM | POA: Diagnosis not present

## 2014-10-29 DIAGNOSIS — R509 Fever, unspecified: Secondary | ICD-10-CM | POA: Diagnosis not present

## 2014-10-29 DIAGNOSIS — N390 Urinary tract infection, site not specified: Secondary | ICD-10-CM | POA: Diagnosis not present

## 2014-11-04 DIAGNOSIS — R399 Unspecified symptoms and signs involving the genitourinary system: Secondary | ICD-10-CM | POA: Diagnosis not present

## 2014-11-04 DIAGNOSIS — J069 Acute upper respiratory infection, unspecified: Secondary | ICD-10-CM | POA: Diagnosis not present

## 2014-11-10 DIAGNOSIS — F331 Major depressive disorder, recurrent, moderate: Secondary | ICD-10-CM | POA: Diagnosis not present

## 2014-11-11 ENCOUNTER — Other Ambulatory Visit: Payer: Self-pay

## 2014-11-11 DIAGNOSIS — Z1231 Encounter for screening mammogram for malignant neoplasm of breast: Secondary | ICD-10-CM

## 2014-12-01 ENCOUNTER — Ambulatory Visit
Admission: RE | Admit: 2014-12-01 | Discharge: 2014-12-01 | Disposition: A | Discharge status: Home / Self Care | Payer: Medicare Other | Source: Ambulatory Visit

## 2014-12-01 DIAGNOSIS — Z1231 Encounter for screening mammogram for malignant neoplasm of breast: Secondary | ICD-10-CM | POA: Diagnosis not present

## 2014-12-01 DIAGNOSIS — F331 Major depressive disorder, recurrent, moderate: Secondary | ICD-10-CM | POA: Diagnosis not present

## 2014-12-02 ENCOUNTER — Ambulatory Visit: Payer: Self-pay | Admitting: Ophthalmology

## 2014-12-02 DIAGNOSIS — E780 Pure hypercholesterolemia: Secondary | ICD-10-CM | POA: Diagnosis not present

## 2014-12-02 DIAGNOSIS — R42 Dizziness and giddiness: Secondary | ICD-10-CM | POA: Diagnosis not present

## 2014-12-02 DIAGNOSIS — C52 Malignant neoplasm of vagina: Secondary | ICD-10-CM | POA: Diagnosis not present

## 2014-12-02 DIAGNOSIS — Z01810 Encounter for preprocedural cardiovascular examination: Secondary | ICD-10-CM | POA: Diagnosis not present

## 2014-12-02 DIAGNOSIS — I10 Essential (primary) hypertension: Secondary | ICD-10-CM | POA: Diagnosis not present

## 2014-12-02 DIAGNOSIS — R251 Tremor, unspecified: Secondary | ICD-10-CM | POA: Diagnosis not present

## 2014-12-02 DIAGNOSIS — H2512 Age-related nuclear cataract, left eye: Secondary | ICD-10-CM | POA: Diagnosis not present

## 2014-12-02 DIAGNOSIS — F329 Major depressive disorder, single episode, unspecified: Secondary | ICD-10-CM | POA: Diagnosis not present

## 2014-12-16 ENCOUNTER — Ambulatory Visit: Payer: Self-pay | Admitting: Ophthalmology

## 2014-12-16 DIAGNOSIS — C52 Malignant neoplasm of vagina: Secondary | ICD-10-CM | POA: Diagnosis not present

## 2014-12-16 DIAGNOSIS — E780 Pure hypercholesterolemia: Secondary | ICD-10-CM | POA: Diagnosis not present

## 2014-12-16 DIAGNOSIS — H269 Unspecified cataract: Secondary | ICD-10-CM | POA: Diagnosis not present

## 2014-12-16 DIAGNOSIS — K219 Gastro-esophageal reflux disease without esophagitis: Secondary | ICD-10-CM | POA: Diagnosis not present

## 2014-12-16 DIAGNOSIS — F329 Major depressive disorder, single episode, unspecified: Secondary | ICD-10-CM | POA: Diagnosis not present

## 2014-12-16 DIAGNOSIS — H2512 Age-related nuclear cataract, left eye: Secondary | ICD-10-CM | POA: Diagnosis not present

## 2014-12-16 DIAGNOSIS — Z888 Allergy status to other drugs, medicaments and biological substances status: Secondary | ICD-10-CM | POA: Diagnosis not present

## 2014-12-16 DIAGNOSIS — I10 Essential (primary) hypertension: Secondary | ICD-10-CM | POA: Diagnosis not present

## 2014-12-16 DIAGNOSIS — M1990 Unspecified osteoarthritis, unspecified site: Secondary | ICD-10-CM | POA: Diagnosis not present

## 2014-12-22 DIAGNOSIS — F331 Major depressive disorder, recurrent, moderate: Secondary | ICD-10-CM | POA: Diagnosis not present

## 2014-12-30 ENCOUNTER — Ambulatory Visit: Payer: Self-pay | Admitting: Ophthalmology

## 2014-12-30 DIAGNOSIS — R002 Palpitations: Secondary | ICD-10-CM | POA: Diagnosis not present

## 2014-12-30 DIAGNOSIS — K219 Gastro-esophageal reflux disease without esophagitis: Secondary | ICD-10-CM | POA: Diagnosis not present

## 2014-12-30 DIAGNOSIS — H269 Unspecified cataract: Secondary | ICD-10-CM | POA: Diagnosis not present

## 2014-12-30 DIAGNOSIS — H9190 Unspecified hearing loss, unspecified ear: Secondary | ICD-10-CM | POA: Diagnosis not present

## 2014-12-30 DIAGNOSIS — I10 Essential (primary) hypertension: Secondary | ICD-10-CM | POA: Diagnosis not present

## 2014-12-30 DIAGNOSIS — R42 Dizziness and giddiness: Secondary | ICD-10-CM | POA: Diagnosis not present

## 2014-12-30 DIAGNOSIS — Z79899 Other long term (current) drug therapy: Secondary | ICD-10-CM | POA: Diagnosis not present

## 2014-12-30 DIAGNOSIS — D649 Anemia, unspecified: Secondary | ICD-10-CM | POA: Diagnosis not present

## 2014-12-30 DIAGNOSIS — R062 Wheezing: Secondary | ICD-10-CM | POA: Diagnosis not present

## 2014-12-30 DIAGNOSIS — Z8589 Personal history of malignant neoplasm of other organs and systems: Secondary | ICD-10-CM | POA: Diagnosis not present

## 2014-12-30 DIAGNOSIS — H2511 Age-related nuclear cataract, right eye: Secondary | ICD-10-CM | POA: Diagnosis not present

## 2014-12-30 DIAGNOSIS — R251 Tremor, unspecified: Secondary | ICD-10-CM | POA: Diagnosis not present

## 2014-12-30 DIAGNOSIS — Z888 Allergy status to other drugs, medicaments and biological substances status: Secondary | ICD-10-CM | POA: Diagnosis not present

## 2014-12-30 DIAGNOSIS — R05 Cough: Secondary | ICD-10-CM | POA: Diagnosis not present

## 2015-01-09 DIAGNOSIS — N3946 Mixed incontinence: Secondary | ICD-10-CM | POA: Diagnosis not present

## 2015-01-09 DIAGNOSIS — R339 Retention of urine, unspecified: Secondary | ICD-10-CM | POA: Diagnosis not present

## 2015-01-19 DIAGNOSIS — F5105 Insomnia due to other mental disorder: Secondary | ICD-10-CM | POA: Diagnosis not present

## 2015-01-23 DIAGNOSIS — N3946 Mixed incontinence: Secondary | ICD-10-CM | POA: Diagnosis not present

## 2015-01-23 DIAGNOSIS — R339 Retention of urine, unspecified: Secondary | ICD-10-CM | POA: Diagnosis not present

## 2015-01-27 DIAGNOSIS — N3946 Mixed incontinence: Secondary | ICD-10-CM | POA: Diagnosis not present

## 2015-02-04 DIAGNOSIS — M47892 Other spondylosis, cervical region: Secondary | ICD-10-CM | POA: Diagnosis not present

## 2015-02-04 DIAGNOSIS — M542 Cervicalgia: Secondary | ICD-10-CM | POA: Diagnosis not present

## 2015-02-04 DIAGNOSIS — M7541 Impingement syndrome of right shoulder: Secondary | ICD-10-CM | POA: Diagnosis not present

## 2015-02-04 DIAGNOSIS — M5412 Radiculopathy, cervical region: Secondary | ICD-10-CM | POA: Diagnosis not present

## 2015-02-09 DIAGNOSIS — E785 Hyperlipidemia, unspecified: Secondary | ICD-10-CM | POA: Diagnosis not present

## 2015-02-09 DIAGNOSIS — F419 Anxiety disorder, unspecified: Secondary | ICD-10-CM | POA: Diagnosis not present

## 2015-02-09 DIAGNOSIS — I10 Essential (primary) hypertension: Secondary | ICD-10-CM | POA: Diagnosis not present

## 2015-02-09 DIAGNOSIS — F329 Major depressive disorder, single episode, unspecified: Secondary | ICD-10-CM | POA: Diagnosis not present

## 2015-02-09 DIAGNOSIS — M549 Dorsalgia, unspecified: Secondary | ICD-10-CM | POA: Diagnosis not present

## 2015-02-10 DIAGNOSIS — F5105 Insomnia due to other mental disorder: Secondary | ICD-10-CM | POA: Diagnosis not present

## 2015-02-10 DIAGNOSIS — F331 Major depressive disorder, recurrent, moderate: Secondary | ICD-10-CM | POA: Diagnosis not present

## 2015-02-10 DIAGNOSIS — F0150 Vascular dementia without behavioral disturbance: Secondary | ICD-10-CM | POA: Diagnosis not present

## 2015-02-18 DIAGNOSIS — R0609 Other forms of dyspnea: Secondary | ICD-10-CM | POA: Diagnosis not present

## 2015-02-18 DIAGNOSIS — I10 Essential (primary) hypertension: Secondary | ICD-10-CM | POA: Diagnosis not present

## 2015-02-18 DIAGNOSIS — R600 Localized edema: Secondary | ICD-10-CM | POA: Diagnosis not present

## 2015-02-18 DIAGNOSIS — F419 Anxiety disorder, unspecified: Secondary | ICD-10-CM | POA: Diagnosis not present

## 2015-02-18 DIAGNOSIS — E785 Hyperlipidemia, unspecified: Secondary | ICD-10-CM | POA: Diagnosis not present

## 2015-02-18 DIAGNOSIS — R509 Fever, unspecified: Secondary | ICD-10-CM | POA: Diagnosis not present

## 2015-02-18 DIAGNOSIS — F329 Major depressive disorder, single episode, unspecified: Secondary | ICD-10-CM | POA: Diagnosis not present

## 2015-02-23 DIAGNOSIS — N3941 Urge incontinence: Secondary | ICD-10-CM | POA: Diagnosis not present

## 2015-02-23 DIAGNOSIS — N3946 Mixed incontinence: Secondary | ICD-10-CM | POA: Diagnosis not present

## 2015-02-24 ENCOUNTER — Ambulatory Visit: Payer: Self-pay | Admitting: Family Medicine

## 2015-02-24 DIAGNOSIS — R05 Cough: Secondary | ICD-10-CM | POA: Diagnosis not present

## 2015-03-02 DIAGNOSIS — N3941 Urge incontinence: Secondary | ICD-10-CM | POA: Diagnosis not present

## 2015-03-10 DIAGNOSIS — M170 Bilateral primary osteoarthritis of knee: Secondary | ICD-10-CM | POA: Diagnosis not present

## 2015-03-23 DIAGNOSIS — N3941 Urge incontinence: Secondary | ICD-10-CM | POA: Diagnosis not present

## 2015-03-26 DIAGNOSIS — Z1389 Encounter for screening for other disorder: Secondary | ICD-10-CM | POA: Diagnosis not present

## 2015-03-26 DIAGNOSIS — Z23 Encounter for immunization: Secondary | ICD-10-CM | POA: Diagnosis not present

## 2015-03-26 DIAGNOSIS — F329 Major depressive disorder, single episode, unspecified: Secondary | ICD-10-CM | POA: Diagnosis not present

## 2015-03-26 DIAGNOSIS — Z0000 Encounter for general adult medical examination without abnormal findings: Secondary | ICD-10-CM | POA: Diagnosis not present

## 2015-03-26 DIAGNOSIS — F419 Anxiety disorder, unspecified: Secondary | ICD-10-CM | POA: Diagnosis not present

## 2015-03-30 DIAGNOSIS — N3941 Urge incontinence: Secondary | ICD-10-CM | POA: Diagnosis not present

## 2015-03-31 ENCOUNTER — Encounter: Payer: Self-pay | Admitting: Nurse Practitioner

## 2015-03-31 ENCOUNTER — Ambulatory Visit (INDEPENDENT_AMBULATORY_CARE_PROVIDER_SITE_OTHER): Payer: Medicare Other | Admitting: Nurse Practitioner

## 2015-03-31 VITALS — BP 116/74 | HR 58 | Wt 182.0 lb

## 2015-03-31 DIAGNOSIS — E785 Hyperlipidemia, unspecified: Secondary | ICD-10-CM | POA: Diagnosis not present

## 2015-03-31 DIAGNOSIS — R55 Syncope and collapse: Secondary | ICD-10-CM | POA: Diagnosis not present

## 2015-03-31 DIAGNOSIS — R0609 Other forms of dyspnea: Secondary | ICD-10-CM

## 2015-03-31 DIAGNOSIS — R002 Palpitations: Secondary | ICD-10-CM

## 2015-03-31 DIAGNOSIS — I10 Essential (primary) hypertension: Secondary | ICD-10-CM

## 2015-03-31 DIAGNOSIS — R0600 Dyspnea, unspecified: Secondary | ICD-10-CM | POA: Insufficient documentation

## 2015-03-31 NOTE — Patient Instructions (Signed)
Medication Instructions:  Your physician recommends that you continue on your current medications as directed. Please refer to the Current Medication list given to you today.  Labwork: Your physician recommends that you have labs today. Magnesium   Testing/Procedures: Your physician has requested that you have an echocardiogram. Echocardiography is a painless test that uses sound waves to create images of your heart. It provides your doctor with information about the size and shape of your heart and how well your heart's chambers and valves are working. This procedure takes approximately one hour. There are no restrictions for this procedure.  Your physician has requested that you have a lexiscan myoview. For further information please visit [email or web address]. Please follow instruction sheet, as given.  Your physician has recommended that you wear an 30 event monitor. Event monitors are medical devices that record the heart's electrical activity. Doctors most often [place] these monitors to diagnose arrhythmias. Arrhythmias are problems with the speed or rhythm of the heartbeat. The monitor is a small, portable device. You can wear one while you do your normal daily activities. This is usually used to diagnose what is causing palpitations/syncope (passing out).  Follow-Up: Your physician recommends that you schedule a follow-up appointment in: 1 month with Dr. Ron Parker.  Any Other Special Instructions Will Be Listed Below (If Applicable).

## 2015-03-31 NOTE — Progress Notes (Signed)
Patient Name: Nancy Blackburn Date of Encounter: 03/31/2015  Primary Care Provider:  Miguel Aschoff, MD Primary Cardiologist:  Veatrice Bourbon, MD   Chief Complaint  69 year old female with prior history of palpitations who presents due to progressive palpitations and dyspnea on exertion.  Past Medical History   Past Medical History  Diagnosis Date  . PONV (postoperative nausea and vomiting)   . Hypertension     a. takes Metoprolol, Lisinopril, and amlodipine.  . Hyperlipidemia     a. takes Simvastatin  . Palpitations     a. June 2013:  Event recorder normal sinus rhythm with rare PVCs - ? symptomatic PVC's.  . Low back pain   . Normal nuclear stress test     a. 07/2005 Nl nuc stress test.  . Ejection fraction     a. EF 55-65%, echo, mild LVH, June, 2013, small pericardial effusion  . Pericardial effusion     a. Small, echo, June, 2013   Past Surgical History  Procedure Laterality Date  . Tonsillectomy      as child  . Tubal ligation      67yrs ago  . Thyroidectomy, partial    . Vaginal hysterectomy      41yrs ago d/t cervical cancer  . Bladder suspension  2003  . Bone spur  2009    removed from right collar bone area and partial collar bone removed  . Back surgery  08/2010    Allergies  Allergies  Allergen Reactions  . Anti-Inflammatory Enzyme [Nutritional Supplements] Other (See Comments)    REACTION: Due to bleeding ulcer  . Cymbalta [Duloxetine Hcl] Other (See Comments)    REACTION: Urinary retention  . Clarithromycin Other (See Comments)    Upsets stomach    HPI  69 year old female with a prior history of palpitations. She was last seen in the office in 2013. Prior to that visit, she underwent echocardiogram and event monitoring revealing rare PVCs. She has been on low-dose beta blocker over the years. Dose is relatively fixed at 25 mg twice a day given baseline bradycardia. Over the past 4 months, she says she has noted increased frequency of  palpitations, occurring almost every night while she is trying to fall asleep. She describes symptoms as a skip of her heartbeat followed by pulsating beats. She does not do silly experienced tachycardia. She does not generally have symptoms during the day.  Over the same period of time, she has also experienced intermittent presyncope that occurs exclusively while standing or walking. This occurs about 2-3 times per month with associated lightheadedness causing her to have to either stop and steady herself or sit down. She has not lost consciousness. She does not experience palpitations, dyspnea or chest pain during presyncopal spells.  She has been trying to lose weight and doing so has been walking more. She notes that she has been expressing dyspnea on exertion and even something as routine as vacuuming causes her to become short of breath. She has not had any chest pain, pressure, or tightness. She denies PND, orthopnea, or early satiety. She has had intermittent lower extremity edema for which her primary care provider has given her when necessary diuretic in the past.  Home Medications  Prior to Admission medications   Medication Sig Start Date End Date Taking? Authorizing Provider  amLODipine (NORVASC) 5 MG tablet Take 5 mg by mouth daily. 02/26/15  Yes Historical Provider, MD  BIOTIN PO Take 5,000 mcg by mouth daily.    Yes  Historical Provider, MD  citalopram (CELEXA) 20 MG tablet Take 20 mg by mouth daily. 02/12/15  Yes Historical Provider, MD  diazepam (VALIUM) 5 MG tablet Take 5 mg by mouth 4 (four) times daily as needed. 02/24/15  Yes Historical Provider, MD  fish oil-omega-3 fatty acids 1000 MG capsule Take 1 g by mouth daily.     Yes Historical Provider, MD  HYDROcodone-acetaminophen (NORCO) 10-325 MG per tablet Take 1 tablet by mouth 4 x a day as needed for pain **NOT TO EXCEED 4 GM TYLENOL IN 24 HOURS** 10/09/13  Yes Mahima Pandey, MD  lisinopril (PRINIVIL,ZESTRIL) 40 MG tablet Take 40  mg by mouth daily. 01/30/15  Yes Historical Provider, MD  metoprolol (LOPRESSOR) 50 MG tablet Take 50 mg by mouth 2 (two) times daily.     Yes Historical Provider, MD  metoprolol tartrate (LOPRESSOR) 25 MG tablet Take 25 mg by mouth 2 (two) times daily. 03/11/15  Yes Historical Provider, MD  omeprazole (PRILOSEC) 20 MG capsule Take 20 mg by mouth. Takes every other day   Yes Historical Provider, MD  simvastatin (ZOCOR) 20 MG tablet Take 20 mg by mouth daily. 01/30/15  Yes Historical Provider, MD  TRAZODONE HCL PO Take 250 mg by mouth at bedtime.   Yes Historical Provider, MD   Review of Systems  As above, she's been expressing more frequent palpitations as well as presyncope and dyspnea on exertion.  All other systems reviewed and are otherwise negative except as noted above.   Physical Exam  VS:  BP 116/74 mmHg  Pulse 58  Wt 182 lb (82.555 kg) , BMI Body mass index is 28.5 kg/(m^2). GEN: Well nourished, well developed, in no acute distress. HEENT: normal. Neck: Supple, no JVD, carotid bruits, or masses. Cardiac: RRR, no murmurs, rubs, or gallops. No clubbing, cyanosis, edema.  Radials/DP/PT 2+ and equal bilaterally.  Respiratory:  Respirations regular and unlabored, clear to auscultation bilaterally. GI: Soft, nontender, nondistended, BS + x 4. MS: no deformity or atrophy. Skin: warm and dry, no rash. Neuro:  Strength and sensation are intact. Psych: Normal affect.  Accessory Clinical Findings  ECG -  sinus bradycardia, 58, left axis, no acute ST or T changes.  Assessment & Plan  1.  Palpitations: Patient has a history of palpitations and possibly symptomatic PVCs. Over the past several months, she has noted increase frequency of palpitations, generally occurring when she's lying down for bed at night. This is been happening several times per month. She was seen by her primary care provider just over a month ago and had some labs including a basic metabolic profile and TSH. Both were  normal. She did not have a magnesium drawn and we'll check that today. She is on beta blocker therapy however has baseline sinus bradycardia and therefore I cannot titrate her metoprolol any further than where it is at currently-25 mg twice a day. She has worn an event monitor in the past-2013 and we will place an event monitor again to reassess cause of palpitations and potential burden of PVCs.  2. Presyncope: Over the past several months, patient's had several episodes of presyncope occurring while standing or walking. She has not had syncope recently but says that she did have a syncopal spell in early 2015 for which she was hospitalized at [facility] As above, I'm placing an event monitor to rule out malignant arrhythmia and I will also obtain a 2-D echocardiogram to reevaluate LV function, which was previously normal in 2013. Labs from  primary care are being faxed over but by phone report, were normal.  3. Dyspnea on exertion: Patient is been trying to get back into exercise and has noted dyspnea with relatively minimal exertion. She's been having dyspnea which is routine chores around the house like vacuuming. She is not had any chest pain. As above, I am checking an echo. I have also ordered a Lexiscan Myoview to rule out ischemia as she does have risk factors for coronary disease including hypertension, hyperlipidemia and advancing age.  4. Hypertension: This is well controlled on beta blocker, calcium channel blocker, and ACE inhibitor therapy.  5. Hyperlipidemia: She is on simvastatin 20 mg and this is followed by her primary care provider.  6. Disposition: Testing as above including magnesium today, echo, event monitor, and Myoview. Follow-up with Dr. Ron Parker in approximately one month or sooner if necessary.    Murray Hodgkins, NP 03/31/2015, 4:15 PM

## 2015-04-01 LAB — MAGNESIUM: Magnesium: 2 mg/dL (ref 1.5–2.5)

## 2015-04-03 NOTE — Consult Note (Signed)
Chief Complaint:  Subjective/Chief Complaint Hgb 10.4.  Pt denies any further melena.Denies abdominal pain, nausea, or vomiting.  She is having joint pains.  States she had awareness during EGD & has questions about sedation.   VITAL SIGNS/ANCILLARY NOTES: **Vital Signs.:   27-Oct-14 07:00  Vital Signs Type Routine  Temperature Temperature (F) 98.1  Celsius 36.7  Temperature Source oral  Pulse Pulse 96  Respirations Respirations 21  Systolic BP Systolic BP 884  Diastolic BP (mmHg) Diastolic BP (mmHg) 61  Mean BP 82  Pulse Ox % Pulse Ox % 92  Oxygen Delivery Room Air/ 21 %  Pulse Ox Heart Rate 98    10:00  Vital Signs Type Routine  Temperature Source oral  Pulse Pulse 92  Pulse source if not from Vital Sign Device per cardiac monitor  Respirations Respirations 16  Systolic BP Systolic BP 166  Diastolic BP (mmHg) Diastolic BP (mmHg) 64  Mean BP 84  Pulse Ox % Pulse Ox % 95  Oxygen Delivery Room Air/ 21 %  Pulse Ox Heart Rate 92   Brief Assessment:  GEN well developed, well nourished, no acute distress, A/Ox3   Cardiac Regular   Respiratory normal resp effort   Gastrointestinal Normal   Gastrointestinal details normal Soft  Nondistended  No rebound tenderness  No gaurding  No rigidity  No organomegaly   EXTR negative cyanosis/clubbing, negative edema   Additional Physical Exam Skin: warm, dry   Lab Results:  Routine Hem:  27-Oct-14 00:00   Hemoglobin (CBC)  10.4 (Result(s) reported on 07 Oct 2013 at 12:05AM.)    08:24   Hemoglobin (CBC)  10.4 (Result(s) reported on 07 Oct 2013 at 08:37AM.)   Assessment/Plan:  Assessment/Plan:  Assessment Bleeding PUD: s/p EGD with injection/clip of visible vessel with Dr Allen Norris.  No further bleeding. Anemia: secondary to GI bleed   Plan 1) Full liquids 2) monitor H/H & watch for recurrent bleeding.  If recurrence, will need surgical intervention 3) Avoid NSAIDS 4) BID PPI for at least 3 months 5) Pt will need propofol for  upcoming colonoscopy due to difficulty with sedation Please call if you have any questions or concerns   Electronic Signatures: Andria Meuse (NP)  (Signed 27-Oct-14 11:11)  Authored: Chief Complaint, VITAL SIGNS/ANCILLARY NOTES, Brief Assessment, Lab Results, Assessment/Plan   Last Updated: 27-Oct-14 11:11 by Andria Meuse (NP)

## 2015-04-03 NOTE — Consult Note (Signed)
Brief Consult Note: Diagnosis: Melena.   Patient was seen by consultant.   Consult note dictated.   Discussed with Attending MD.   Comments: Thi patient is on NSAID's for a long te and has signs of an upper GI bleed. She has had a stable Hb after her transfusion without any further sin of bleeding. Will continue to monitor and plan for an EGD when more stable and less confused.  Electronic Signatures: Lucilla Lame (MD)  (Signed 25-Oct-14 10:33)  Authored: Brief Consult Note   Last Updated: 25-Oct-14 10:33 by Lucilla Lame (MD)

## 2015-04-03 NOTE — Discharge Summary (Signed)
PATIENT NAME:  Nancy Blackburn, Nancy Blackburn MR#:  [id number] DATE OF BIRTH:  1946-02-09  DATE OF ADMISSION:  10/04/2013 DATE OF DISCHARGE:  10/09/2013  DISCHARGE DIAGNOSES: 1.  Gastric pyloric ulcer.  2.  Acute blood loss anemia.  3.  Chronic pain syndrome.  4.  Hypertension.   CONSULTANTS: Dr. Allen Norris of GI.   IMAGING STUDIES DONE: Include a chest x-ray which showed no acute abnormalities.   PROCEDURES: EGD which showed a large pyloric ulcer with bleeding vessel which was clipped.   ADMITTING HISTORY AND PHYSICAL: Please see detailed H and P dictated previously. In brief, a 69 year old female patient with chronic pain on naproxen, presented to the hospital with complaints of bleeding and black stools. The patient was admitted to the hospitalist service for concern for GI bleed.   HOSPITAL COURSE: 1.  Pyloric ulcer. The patient had an EGD done after 4 units of blood transfusion given, and showed a large pyloric ulcer with bleeding vessel which was clipped. The patient was monitored over 3 days in the hospital, initially on Protonix drip, later switched to oral Protonix. Her hemoglobin is stable, around 10.5 by day of discharge. Blood pressure is in normal range. No further bleeding, melena. Tolerating food. No abdominal pain. No tenderness on examination, and is being discharged to skilled nursing facility as recommended by PT. The patient's naproxen has been stopped. She will not be on any heparin, blood thinners or aspirin. She will follow up with Dr. Allen Norris of GI in 1 to 2 weeks. The patient has been started on Protonix b.i.d., Carafate, ferrous sulfate. Naproxen stopped.   DISCHARGE MEDICATIONS: Include:  1.  Protonix 40 mg oral b.i.d.  2.  Simvastatin 20 mg daily.  3.  Biotin 1 capsule oral 2 times a day.  4.  Fish oil 1000 mg oral 2 times a day.  5.  Escitalopram 10 mg oral once a day.  6.  Metoprolol tartrate 50 mg oral 2 times a day.  7.  Protonix 40 mg oral 2 times a day.  8.  Ferrous  sulfate 325 mg oral 2 times a day.  9.  Carafate 1 gram oral 3 times a day.  10.  Acetaminophen/hydrocodone 325/10, 1 tablet oral 4 times a day as needed for pain.     DISCHARGE INSTRUCTIONS: Low-sodium diet. Activity as tolerated. Follow up with Dr. Allen Norris in 1 to 2 weeks.   Time spent on day of discharge in discharge activity was 45 minutes.    ____________________________ Leia Alf Ourania Hamler, MD srs:dmm D: 10/09/2013 11:16:54 ET T: 10/09/2013 12:06:27 ET JOB#: [id number]  cc: Alveta Heimlich R. Jovaun Levene, MD, <Dictator> Lucilla Lame, MD Neita Carp MD ELECTRONICALLY SIGNED 10/09/2013 13:12

## 2015-04-03 NOTE — H&P (Signed)
PATIENT NAME:  Nancy Blackburn, Nancy Blackburn MR#:  [id number] DATE OF BIRTH:  1946-05-15  DATE OF ADMISSION:  10/04/2013  Addendum  Elevated troponin with chest pains concerning for ischemia, however, will not be able to initiate the patient on aspirin or blood thinners due to her active bleeding. I would start her on metoprolol, however. We are not able to give her nitroglycerin unless her blood pressure improves with IV fluid administration. We will check the patient's cardiac enzymes x 3 and we will be asking cardiologist to see patient, as well as echocardiogram will be ordered.    ____________________________ Theodoro Grist, MD rv:jm D: 10/04/2013 20:42:43 ET T: 10/04/2013 21:38:19 ET JOB#: [id number]  cc: Theodoro Grist, MD, <Dictator> Richard L. Rosanna Randy, MD Theodoro Grist MD ELECTRONICALLY SIGNED 10/13/2013 76:54

## 2015-04-03 NOTE — Consult Note (Signed)
PATIENT NAME:  Nancy Blackburn, Nancy Blackburn MR#:  [id number] DATE OF BIRTH:  Dec 27, 1945  DATE OF ADMISSION: 10/04/2013  DATE OF CONSULTATION:  10/05/2013  CONSULTING SERVICE:  Gastroenterology  CONSULTING PHYSICIAN:  Lucilla Lame, MD  REASON FOR CONSULTATION: Upper GI bleed.   HISTORY OF PRESENT ILLNESS: This patient is a 69 year old woman who has a history of back pain, and has been on anti-inflammatories twice a day for a number of years. She denies ever having a GI bleed in the past, but yesterday she was feeling very weak and she had 3 episodes of black stools. She also felt presyncopal and had fallen, with her sister catching her at one point. The patient was taking a family member to a 90 appointment in [place] when she started to feel poorly. Her sister went to [place] and picked her up, and brought her straight to her primary care provider's office, whereupon she was sent to the Emergency Department. The patient started having some complaints of chest pain in the Emergency Department, and was noted to have a drop in her hemoglobin from 10.5 on admission down to 6.6. The patient then was transfused 1 unit of blood, and it went up to 7.9. The patient's white cell count was normal the entire time. The patient has not done anything different with her medications recently, including not taking any increased anti-inflammatories. The patient's course overnight was okay, with some confusion, and a report of no further signs of GI bleeding. The patient had been started on a cardiac workup to rule out any cardiac disease, and her troponin was elevated at 0.1, but her EKG was reported to be normal.   PAST MEDICAL HISTORY: Hypertension, back pain, sinus problems, hyperlipidemia, depression.  MEDICATIONS:  Biotin, Naprosyn twice a day, Norvasc, lisinopril, escitalopram, hydrocodone,  metoprolol.   PAST SURGICAL HISTORY: Tonsillectomy, hysterectomy, bladder surgery, back surgery.   ALLERGIES:  No  known drug allergies.   FAMILY HISTORY: Noncontributory for GI bleed.   SOCIAL HISTORY: The patient denies tobacco. Reports some social alcohol use a few times a year.   REVIEW OF SYSTEMS:  A 10-point review of systems negative, except what was stated above.   PHYSICAL EXAMINATION: GENERAL: The patient is lying in bed, appears slightly confused. She and her sister have not agreed on a lot of the past medical history.  VITAL SIGNS: Pulse 98, temperature 99, heart rate 93, pulse oximetry 95 on 2 liters, blood pressure 127/66.  HEENT: Normocephalic, atraumatic. Extraocular motor intact. Pupils equally round, reactive to light and accommodation, without JVD, without lymphadenopathy.  LUNGS: Clear to auscultation bilaterally.  HEART: Regular rate and rhythm, without murmurs, rubs or gallops.  ABDOMEN: Soft, nontender, nondistended, without hepatosplenomegaly.  EXTREMITIES: Without cyanosis, clubbing or edema.  NEUROLOGICAL EXAM: The patient is slightly confused, without any focal neurologic signs.  SKIN: Without any rashes or lesions.   ANCILLARY SERVICES:  As stated above. The patient's repeat hemoglobin 7 hours after the 7.9, it was 8.0 and has remained stable.   ASSESSMENT AND PLAN: This patient is a 69 year old woman who comes in with what appears to be an upper gastrointestinal bleed due to anti-inflammatory medication. The patient has had no further bleeding with her stable hemoglobin after 1 unit of blood. The patient will be continued on a proton pump inhibitor, and will be followed. If her hemoglobin should drop, she should be set up for an upper endoscopy. The patient will be monitored during this hospital stay, and no procedure will be planned  until she is less confused. The patient and her sister have been explained this, and agree with the plan.   Thank you very much for involving me in the care of this patient. If you have any questions, please do not hesitate to call.     ____________________________ Lucilla Lame, MD dw:mr D: 10/05/2013 17:52:26 ET T: 10/05/2013 19:08:54 ET JOB#: [id number]  cc: Lucilla Lame, MD, <Dictator> Lucilla Lame MD ELECTRONICALLY SIGNED 10/07/2013 9:53

## 2015-04-03 NOTE — Consult Note (Signed)
Chief Complaint:  Subjective/Chief Complaint Pt denies any further melena.  Denies abdominal pain or vomiting.  1 episode of transient nausea that lasted less than 1 minute this AM.   VITAL SIGNS/ANCILLARY NOTES: **Vital Signs.:   29-Oct-14 05:28  Vital Signs Type Routine  Temperature Temperature (F) 98.5  Celsius 36.9  Temperature Source oral  Pulse Pulse 100  Respirations Respirations 18  Systolic BP Systolic BP 829  Diastolic BP (mmHg) Diastolic BP (mmHg) 79  Mean BP 937  Systolic BP Systolic BP 169  Diastolic BP (mmHg) Diastolic BP (mmHg) 71  Systolic BP Systolic BP 678  Diastolic BP (mmHg) Diastolic BP (mmHg) 83  Systolic BP Systolic BP 938  Diastolic BP (mmHg) Diastolic BP (mmHg) 69  Pulse Ox % Pulse Ox % 94  Pulse Ox Activity Level  At rest  Oxygen Delivery Room Air/ 21 %   Brief Assessment:  GEN well developed, well nourished, no acute distress, A/Ox3   Cardiac Regular   Respiratory normal resp effort   Gastrointestinal Normal   Gastrointestinal details normal Soft  Nondistended  No rebound tenderness  No gaurding  No rigidity  No organomegaly   EXTR negative cyanosis/clubbing, negative edema   Additional Physical Exam Skin: warm, dry   Assessment/Plan:  Assessment/Plan:  Assessment Bleeding PUD: s/p EGD with injection/clip of visible vessel with Dr Allen Norris.  No further bleeding. Anemia: secondary to GI bleed   Plan 1) Office visit in 6 weeks to set up EGD w/ Dr Allen Norris 2) Pt will need propofol for upcoming EGD due to difficulty with sedation 3) BID PPI  Will sign off, please call if you have any questions or concerns   Electronic Signatures: Andria Meuse (NP)  (Signed 29-Oct-14 10:55)  Authored: Chief Complaint, VITAL SIGNS/ANCILLARY NOTES, Brief Assessment, Assessment/Plan   Last Updated: 29-Oct-14 10:55 by Andria Meuse (NP)

## 2015-04-03 NOTE — H&P (Signed)
PATIENT NAME:  Nancy Blackburn, Nancy Blackburn MR#:  [id number] DATE OF BIRTH:  October 14, 1946  DATE OF ADMISSION:  10/04/2013  PRIMARY CARE PHYSICIAN: Nancy L. Rosanna Randy, MD  HISTORY OF PRESENT ILLNESS: The patient is a 69 year old Caucasian female with past medical history significant for history of back pain, was on nonsteroidal antiinflammatory medications twice a day for years, history of hypertension, who presents to the hospital with complaints of bleeding. According to the patient, she was doing well up until approximately 11:00 p.m. on the day before admission. She felt the urge to go to the bathroom and when she went to the bathroom, she noted that her stool was black in color. She had at least 3 episodes of   black stool. She felt somewhat lightheaded and dizzy and felt presyncopal. She fell down at least 3 times during the past night. She presented to her primary care physician's office, from where she was sent to Emergency Room for further evaluation. In the Emergency Room, she was noted to be somewhat anemic with hemoglobin level of 10.5. She was also complaining of chest pains. Initially, she started noticing chest pains upon arrival to the Emergency Room. The pain was in the anterior as well as posterior aspect of her chest, but she was not able to provide much more history about that. She tells me that she noted it when she was coughing and she thought that it was cough related. She denies any alleviating or exacerbating factors. She was noted to have troponin elevation to 0.1. Her EKG is unremarkable. Hospital services were contacted for admission.   Past medical history is significant for history of hypertension, back pains, sinus problems, hyperlipidemia, depression. She apparently was investigated for abnormal EKG in the past; however, she had an echocardiogram as well as stress test done approximately 4 or 5 years ago, which were negative. It was done due to her EKG being abnormal, showing Q waves  concerning for possible septal infarct.   PAST MEDICAL HISTORY: Significant for history of hypertension, back pains, sinus problems, hyperlipidemia as well as depression,   MEDICATIONS: Biotin 5000 mcg p.o. twice daily, fish oil 1 gram twice daily, naproxen 500 mg p.o. twice daily, Norvasc 5 mg daily, lisinopril 40 mg daily, escitalopram 10 mg daily, hydrocodone 10/325 mg 1 tablet every 4 hours as needed, metoprolol tartrate 75 mg p.o. twice daily.   PAST SURGICAL HISTORY: Tonsillectomy, hysterectomy due to cervical dysplasia, 2 back surgeries, bladder sling, shoulder surgery as well as partial thyroidectomy.   ALLERGIES: None.   FAMILY HISTORY: Positive for hypertension in the patient's family, patient's daughter. The patient's mother had dementia as well as polio as a child. Diabetes mellitus in the patient's paternal grandmother. Also cancer in the patient's niece who has breast cancer.   SOCIAL HISTORY: The patient is divorced, has 2 children who live one in [place], the other one in [place]. Denies any smoking. Admits of having some alcohol use maybe twice a year. She used to work as an Glass blower/designer for [employer].   REVIEW OF SYSTEMS:  Positive for fatigue and weakness for the past 1 day, pains in her chest intermittently while in the Emergency Room, weight loss of approximately 15 pounds since 4 weeks ago. The patient's daughter tells me that she is not eating well over the past 4 weeks and she thinks that she is losing weight longer than 2 weeks. Right cataract, which does not require any surgery at this time. Sinus congestion, some cough with  greenish phlegm for the past 2 weeks, also episodes of hemoptysis 2 weeks ago, also shortness of breath on exertion mostly. Admits of chest pains on arrival to Emergency Room and feeling presyncopal earlier today. Also nauseated earlier today and admits of having black tarry-looking stool for the past 12 to 24 hours x 3. Usually she is  constipated. Otherwise, denies fevers, chills, pains in her abdomen. No weight gain. EYES: Denies any blurry vision, double vision or glaucoma.  EARS, NOSE, THROAT: Denies any tinnitus, allergies, epistaxis, sinus tenderness or difficulty swallowing.  RESPIRATORY: Denies any wheezing, asthma, COPD.  CARDIOVASCULAR: Denies any orthopnea, edema, arrhythmias. Admits of having palpitations earlier today. GASTROINTESTINAL: Denies any vomiting, diarrhea, hematemesis. Admits of rectal bleeding. Denies any changes in bowel habits. GENITOURINARY: Denies dysuria, hematuria, frequency or incontinence. ENDOCRINE: Denies any polydipsia, nocturia, thyroid problems, heat or cold intolerance or thirst. HEMATOLOGIC: Denies anemia, easy bruising, bleeding, swollen glands. SKIN: Denies acne, rashes, lesions or change in moles.  MUSCULOSKELETAL: Denies arthritis, cramps, swelling, gout.  NEUROLOGICAL: No numbness, epilepsy or tremors. PSYCHIATRIC: Denies anxiety, insomnia or depression.  PHYSICAL EXAMINATION:  VITAL SIGNS: On arrival to the hospital, the patient's temperature is 97.6. Pulse is 89, respiratory rate 20, blood pressure 126/53. Saturation was 98% on room air. GENERAL: This is a well-developed, well-nourished, pale Caucasian female lying on the stretcher. She is somewhat anxious but comfortable on the stretcher.  HEENT: Her pupils are equal, reactive to light. Extraocular movements intact. No icterus or conjunctivitis. Has normal hearing. No pharyngeal erythema. Mucosa is moist.  NECK: No masses. Supple, nontender. Thyroid is not enlarged. No adenopathy. No JVD or carotid bruits bilaterally. Full range of motion.  LUNGS: Clear to auscultation in all fields. No rales, rhonchi, diminished breath sounds. No labored inspirations, increased effort, dullness to percussion, overt respiratory distress.  CARDIOVASCULAR: S1, S2 appreciated. No murmurs, gallops or rubs noted. PMI not lateralized. Chest is nontender  to palpation.  EXTREMITIES: 1+ pedal pulses. No lower extremity edema, calf tenderness or cyanosis was noted.  ABDOMEN: Soft, minimally tender in periumbilical area. Nothing in the epigastric area. No hepatosplenomegaly or masses were noted.  RECTAL: Deferred.  MUSCULOSKELETAL: Muscle strength: Able to move all extremities. No cyanosis, degenerative joint disease or kyphosis. Gait is not tested.  SKIN: Did not reveal any rashes, lesions, erythema, nodularity or induration. It was warm and dry to palpation.  LYMPH: No adenopathy in the cervical region.  NEUROLOGICAL: Cranial nerves grossly intact. Sensory is intact. No dysarthria or aphasia.  PSYCHIATRIC: The patient is alert, oriented to time, person and place, cooperative. Memory is good. No significant confusion. The patient is somewhat stressed out and anxious, however.   LABORATORY AND DIAGNOSTIC DATA: The patient's EKG showed normal sinus rhythm at 79 beats per minute, normal axis; minimal voltage criteria for LVH, may be normal variant; septal infarct according to EKG criteria, age undetermined; no acute ST-T changes were noted. BMP showed glucose of 113, BUN of 46, otherwise unremarkable. The patient's liver enzymes: Albumin level of 3.0, otherwise unremarkable. Cardiac enzymes: Troponin was elevated at 0.1. White blood cell count was normal at 10.2, hemoglobin was 10.5, platelet count 436. Urinalysis: Straw clear urine, negative for glucose, (Dictation Anomaly) negative for ketones, specific gravity 1.011; pH was 6.0; negative for blood or protein, positive for nitrites, trace leukocyte esterase, 2 red blood cells, 2 white blood cells, 3+ bacteria; 1 epithelial cell as well as mucus was present.   RADIOLOGIC STUDIES: None.   ASSESSMENT AND PLAN: 1.  Acute posthemorrhagic anemia: Admit patient to medical floor. Continue her on IV fluids, rehydrate her and follow her hemoglobin level every 8 hours, transfuse as needed. 2.  Gastrointestinal  bleed: Suspect due to nonsteroidal antiinflammatory medications. Continue the patient on PPI IV drip. GI consultation will be requested for morning.  3.  Dehydration: Continue IV fluids. 4.  History of hypertension: We will be holding blood pressure medications. 5.  Back pain: The patient will likely need to be on hydrocodone, but not on nonsteroidal antiinflammatory medications in the future. 6.  Hyperlipidemia: We will be holding all medications at this time.   TIME SPENT: 50 minutes.    ____________________________ Theodoro Grist, MD rv:jm D: 10/04/2013 20:40:51 ET T: 10/04/2013 21:10:58 ET JOB#: [id number]  cc: Theodoro Grist, MD, <Dictator> Nancy L. Rosanna Randy, MD Jaymir Struble Ether Griffins MD ELECTRONICALLY SIGNED 11/09/2013 20:50

## 2015-04-03 NOTE — Consult Note (Signed)
Chief Complaint:  Subjective/Chief Complaint Hgb 10.5.  Pt denies any further melena.  Denies abdominal pain, nausea, or vomiting.   VITAL SIGNS/ANCILLARY NOTES:  **Vital Signs.:   28-Oct-14 08:04  Respirations Respirations 24    11:00  Vital Signs Type Routine  Temperature Temperature (F) 98.7  Celsius 37  Temperature Source oral  Pulse Pulse 86  Pulse source if not from Vital Sign Device per cardiac monitor  Respirations Respirations 15  Systolic BP Systolic BP 474  Diastolic BP (mmHg) Diastolic BP (mmHg) 50  Mean BP 69  Pulse Ox % Pulse Ox % 92  Pulse Ox Activity Level  At rest  Oxygen Delivery Room Air/ 21 %  Pulse Ox Heart Rate 92   Brief Assessment:  GEN well developed, well nourished, no acute distress, A/Ox3   Cardiac Regular   Respiratory normal resp effort   Gastrointestinal Normal   Gastrointestinal details normal Soft  Nondistended  No rebound tenderness  No gaurding  No rigidity  No organomegaly   EXTR negative cyanosis/clubbing, negative edema   Additional Physical Exam Skin: warm, dry   Lab Results:  Routine Hem:  28-Oct-14 06:26   Hemoglobin (CBC)  10.5 (Result(s) reported on 08 Oct 2013 at 07:09AM.)   Assessment/Plan:  Assessment/Plan:  Assessment Bleeding PUD: s/p EGD with injection/clip of visible vessel with Dr Allen Norris.  No further bleeding. Anemia: secondary to GI bleed   Plan 1) Office visit in 6 weeks to set up EGD w/ Dr Allen Norris 2) Discussed PUD diet & gave pt [facility] website for further information 3) Avoid NSAIDS 4) BID PPI for at least 3 months 5) Pt will need propofol for upcoming EGD/colonoscopy due to difficulty with sedation Please call if you have any questions or concerns   Electronic Signatures: Andria Meuse (NP)  (Signed 28-Oct-14 12:41)  Authored: Chief Complaint, VITAL SIGNS/ANCILLARY NOTES, Brief Assessment, Lab Results, Assessment/Plan   Last Updated: 28-Oct-14 12:41 by Andria Meuse (NP)

## 2015-04-12 NOTE — Op Note (Signed)
PATIENT NAME:  Nancy Blackburn, Nancy Blackburn MR#:  [id number] DATE OF BIRTH:  Apr 09, 1946  DATE OF PROCEDURE:  12/30/2014  PREOPERATIVE DIAGNOSIS:  Nuclear sclerotic cataract of the right eye.   POSTOPERATIVE DIAGNOSIS:  Nuclear sclerotic cataract of the right eye.   OPERATIVE PROCEDURE:  Cataract extraction by phacoemulsification with implant of intraocular lens to right eye.   SURGEON:  Birder Robson, MD.   ANESTHESIA:  1. Managed anesthesia care.  2. Topical tetracaine drops followed by 2% Xylocaine jelly applied in the preoperative holding area.   COMPLICATIONS:  None.   TECHNIQUE:   Stop and chop.  DESCRIPTION OF PROCEDURE:  The patient was examined and consented in the preoperative holding area where the aforementioned topical anesthesia was applied to the right eye and then brought back to the operating room where the right eye was prepped and draped in the usual sterile ophthalmic fashion and a lid speculum was placed. A paracentesis was created with the side port blade and the anterior chamber was filled with viscoelastic. A near clear corneal incision was performed with the steel keratome. A continuous curvilinear capsulorrhexis was performed with a cystotome followed by the capsulorrhexis forceps. Hydrodissection and hydrodelineation were carried out with BSS on a blunt cannula. The lens was removed in a stop and chop technique and the remaining cortical material was removed with the irrigation-aspiration handpiece. The capsular bag was inflated with viscoelastic and the Tecnis ZCB00 15.5-diopter lens, serial number [id number] was placed in the capsular bag without complication. The remaining viscoelastic was removed from the eye with the irrigation-aspiration handpiece. The wounds were hydrated. The anterior chamber was flushed with Miostat and the eye was inflated to physiologic pressure. 0.1 mL of cefuroxime concentration 10 mg/mL was placed in the anterior chamber. The wounds were found to  be water tight. The eye was dressed with Vigamox. The patient was given protective glasses to wear throughout the day and a shield with which to sleep tonight. The patient was also given drops with which to begin a drop regimen today and will follow-up with me in one day.    ____________________________ Livingston Diones. Eron Staat, MD wlp:bm D: 12/30/2014 20:49:07 ET T: 12/30/2014 23:00:45 ET JOB#: [id number]  cc: Caston Coopersmith L. Billey Wojciak, MD, <Dictator> Livingston Diones Jerl Munyan MD ELECTRONICALLY SIGNED 12/31/2014 16:10

## 2015-04-12 NOTE — Op Note (Signed)
PATIENT NAME:  Nancy Blackburn, Nancy Blackburn MR#:  [id number] DATE OF BIRTH:  1946/04/08  DATE OF PROCEDURE:  12/16/2014  LOCATION: [facility] PREOPERATIVE DIAGNOSIS:  Nuclear sclerotic cataract of the left eye.   POSTOPERATIVE DIAGNOSIS:  Nuclear sclerotic cataract of the left eye.   OPERATIVE PROCEDURE:  Cataract extraction by phacoemulsification with implant of intraocular lens to the left eye.   SURGEON:  Birder Robson, MD.   ANESTHESIA:  1. Managed anesthesia care.  2. Topical tetracaine drops followed by 2% Xylocaine jelly applied in the preoperative holding area.   COMPLICATIONS:  None.   TECHNIQUE:   Stop and chop.  DESCRIPTION OF PROCEDURE:  The patient was examined and consented in the preoperative holding area where the aforementioned topical anesthesia was applied to the left eye and then brought back to the Operating Room where the left eye was prepped and draped in the usual sterile ophthalmic fashion and a lid speculum was placed. A paracentesis was created with the side port blade and the anterior chamber was filled with viscoelastic. A near clear corneal incision was performed with the steel keratome. A continuous curvilinear capsulorrhexis was performed with a cystotome followed by the capsulorrhexis forceps. Hydrodissection and hydrodelineation were carried out with BSS on a blunt cannula. The lens was removed in a stop-and-chop technique and the remaining cortical material was removed with the irrigation-aspiration handpiece. The capsular bag was inflated with viscoelastic and the Tecnis ZCB00 14.0-diopter lens, serial number [id number] was placed in the capsular bag without complication. The remaining viscoelastic was removed from the eye with the irrigation-aspiration handpiece. The wounds were hydrated. The anterior chamber was flushed with Miostat and the eye was inflated to physiologic pressure. 0.1 mL of cefuroxime concentration 10 mg/mL was placed in the anterior chamber. The  wounds were found to be water tight. The eye was dressed with Vigamox.   The patient was given protective glasses to wear throughout the day and a shield with which to sleep tonight. The patient was also given drops with which to begin a drop regimen today and will follow-up with me in 1 day.    ____________________________ Livingston Diones. Adlai Nieblas, MD wlp:MT D: 12/17/2014 07:51:04 ET T: 12/17/2014 13:10:04 ET JOB#: [id number]  cc: Perseus Westall L. Guss Farruggia, MD, <Dictator> Livingston Diones Margaruite Top MD ELECTRONICALLY SIGNED 12/17/2014 15:23

## 2015-04-13 DIAGNOSIS — N3941 Urge incontinence: Secondary | ICD-10-CM | POA: Diagnosis not present

## 2015-04-14 ENCOUNTER — Telehealth (HOSPITAL_COMMUNITY): Payer: Self-pay

## 2015-04-14 NOTE — Telephone Encounter (Signed)
Patient given detailed instructions per Myocardial Perfusion Study Information Sheet for test on 04-15-2015 at 9:30am. Patient verbalized understanding. Nancy Seymore A.

## 2015-04-15 ENCOUNTER — Encounter: Payer: Self-pay | Admitting: Radiology

## 2015-04-15 ENCOUNTER — Other Ambulatory Visit: Payer: Self-pay

## 2015-04-15 ENCOUNTER — Ambulatory Visit (HOSPITAL_BASED_OUTPATIENT_CLINIC_OR_DEPARTMENT_OTHER): Payer: Medicare Other

## 2015-04-15 ENCOUNTER — Ambulatory Visit (HOSPITAL_COMMUNITY): Payer: Medicare Other | Attending: Nurse Practitioner

## 2015-04-15 ENCOUNTER — Ambulatory Visit (INDEPENDENT_AMBULATORY_CARE_PROVIDER_SITE_OTHER): Payer: Medicare Other

## 2015-04-15 DIAGNOSIS — Z87891 Personal history of nicotine dependence: Secondary | ICD-10-CM | POA: Diagnosis not present

## 2015-04-15 DIAGNOSIS — R0600 Dyspnea, unspecified: Secondary | ICD-10-CM

## 2015-04-15 DIAGNOSIS — I10 Essential (primary) hypertension: Secondary | ICD-10-CM | POA: Diagnosis not present

## 2015-04-15 DIAGNOSIS — R0609 Other forms of dyspnea: Secondary | ICD-10-CM | POA: Diagnosis not present

## 2015-04-15 DIAGNOSIS — R002 Palpitations: Secondary | ICD-10-CM

## 2015-04-15 DIAGNOSIS — E785 Hyperlipidemia, unspecified: Secondary | ICD-10-CM | POA: Diagnosis not present

## 2015-04-15 LAB — MYOCARDIAL PERFUSION IMAGING
Estimated workload: 1 METS
LV dias vol: 66 mL
LV sys vol: 12 mL
Nuc Stress EF: 82 %
Peak HR: 86 {beats}/min
Percent of predicted max HR: 56 %
RATE: 0.24
Rest HR: 62 {beats}/min
SDS: 6
SRS: 6
SSS: 12
Stage 1 DBP: 86 mmHg
Stage 1 Grade: 0 %
Stage 1 HR: 66 {beats}/min
Stage 1 SBP: 140 mmHg
Stage 1 Speed: 0 mph
Stage 2 Grade: 0 %
Stage 2 HR: 66 {beats}/min
Stage 2 Speed: 0 mph
Stage 3 DBP: 76 mmHg
Stage 3 Grade: 0 %
Stage 3 HR: 88 {beats}/min
Stage 3 SBP: 145 mmHg
Stage 3 Speed: 0 mph
Stage 4 Grade: 0 %
Stage 4 HR: 86 {beats}/min
Stage 4 Speed: 0 mph
Stage 5 Grade: 0 %
Stage 5 HR: 85 {beats}/min
Stage 5 Speed: 0 mph
Stage 6 Grade: 0 %
Stage 6 HR: 77 {beats}/min
Stage 6 Speed: 0 mph
TID: 0.86

## 2015-04-15 MED ORDER — REGADENOSON 0.4 MG/5ML IV SOLN
0.4000 mg | Freq: Once | INTRAVENOUS | Status: AC
  Administered 2015-04-15: 0.4 mg via INTRAVENOUS

## 2015-04-15 MED ORDER — TECHNETIUM TC 99M SESTAMIBI GENERIC - CARDIOLITE
11.0000 | Freq: Once | INTRAVENOUS | Status: AC | PRN
  Administered 2015-04-15: 11 via INTRAVENOUS

## 2015-04-15 MED ORDER — AMINOPHYLLINE 25 MG/ML IV SOLN
75.0000 mg | Freq: Once | INTRAVENOUS | Status: AC
  Administered 2015-04-15: 75 mg via INTRAVENOUS

## 2015-04-15 MED ORDER — TECHNETIUM TC 99M SESTAMIBI GENERIC - CARDIOLITE
33.0000 | Freq: Once | INTRAVENOUS | Status: AC | PRN
  Administered 2015-04-15: 33 via INTRAVENOUS

## 2015-04-15 NOTE — Progress Notes (Signed)
Patient ID: Nancy Blackburn, female   DOB: 25-Aug-1946, 69 y.o.   MRN: [id number]  Pre[employer] 30 day monitor applied. EOS 05-17-15

## 2015-04-17 ENCOUNTER — Ambulatory Visit: Payer: Medicare Other | Admitting: Cardiology

## 2015-04-20 DIAGNOSIS — N3941 Urge incontinence: Secondary | ICD-10-CM | POA: Diagnosis not present

## 2015-04-20 DIAGNOSIS — N3946 Mixed incontinence: Secondary | ICD-10-CM | POA: Diagnosis not present

## 2015-04-27 DIAGNOSIS — N3941 Urge incontinence: Secondary | ICD-10-CM | POA: Diagnosis not present

## 2015-05-04 DIAGNOSIS — N3941 Urge incontinence: Secondary | ICD-10-CM | POA: Diagnosis not present

## 2015-05-12 DIAGNOSIS — F5105 Insomnia due to other mental disorder: Secondary | ICD-10-CM | POA: Diagnosis not present

## 2015-05-12 DIAGNOSIS — F331 Major depressive disorder, recurrent, moderate: Secondary | ICD-10-CM | POA: Diagnosis not present

## 2015-05-12 DIAGNOSIS — F0150 Vascular dementia without behavioral disturbance: Secondary | ICD-10-CM | POA: Diagnosis not present

## 2015-05-17 ENCOUNTER — Encounter: Payer: Self-pay | Admitting: Cardiology

## 2015-05-17 DIAGNOSIS — R55 Syncope and collapse: Secondary | ICD-10-CM | POA: Insufficient documentation

## 2015-05-18 ENCOUNTER — Ambulatory Visit (INDEPENDENT_AMBULATORY_CARE_PROVIDER_SITE_OTHER): Payer: Medicare Other | Admitting: Cardiology

## 2015-05-18 ENCOUNTER — Encounter: Payer: Self-pay | Admitting: Cardiology

## 2015-05-18 VITALS — BP 114/70 | HR 69 | Ht 67.0 in | Wt 188.2 lb

## 2015-05-18 DIAGNOSIS — N3941 Urge incontinence: Secondary | ICD-10-CM | POA: Diagnosis not present

## 2015-05-18 DIAGNOSIS — R55 Syncope and collapse: Secondary | ICD-10-CM

## 2015-05-18 DIAGNOSIS — R002 Palpitations: Secondary | ICD-10-CM | POA: Diagnosis not present

## 2015-05-18 DIAGNOSIS — I10 Essential (primary) hypertension: Secondary | ICD-10-CM

## 2015-05-18 DIAGNOSIS — R0609 Other forms of dyspnea: Secondary | ICD-10-CM | POA: Diagnosis not present

## 2015-05-18 DIAGNOSIS — R0600 Dyspnea, unspecified: Secondary | ICD-10-CM

## 2015-05-18 NOTE — Assessment & Plan Note (Signed)
At this time, the patient's shortness of breath does not appear to be cardiac in origin. She has had a low risk nuclear scan with no definite ischemia. No further workup.

## 2015-05-18 NOTE — Assessment & Plan Note (Signed)
The patient has no signs of orthostasis. She says that she can have broad blood pressure swings. I have ordered a plasma metanephrine as a screening study. No other workup is recommended.

## 2015-05-18 NOTE — Progress Notes (Signed)
Cardiology Office Note   Date:  05/18/2015   ID:  Nancy Blackburn, Nancy Blackburn 03-Oct-1946, MRN [id number]  PCP:  Wilhemena Durie, MD  Cardiologist:  Dola Argyle, MD   Chief Complaint  Patient presents with  . Appointment    Follow-up palpitations and presyncope and exertional shortness of breath      History of Present Illness: Nancy Blackburn is a 69 y.o. female who presents today to follow-up multiple cardiac issues. I had not seen the patient since 2013. However she was seen in our office with a very complete evaluation on March 31, 2015. Multiple issues were addressed and multiple studies were ordered. I carefully reviewed all of the older records. In addition I reviewed the data from March 31, 2015. The patient had palpitations. Decision was made for her to wear an event recorder. I have personally reviewed the event recorder. She has sinus rhythm. There were no significant arrhythmias.  The patient also was describing presyncopal symptoms. Two-dimensional echo was done showing normal left ventricular function and no significant valvular abnormalities.  Patient also had shortness of breath with exertion. A nuclear stress test was done showing no significant abnormalities. Also magnesium was checked to complete her labs. Magnesium was normal at 2.0.  The patient today again described some dizziness while walking intermittently. It does not sound like vertigo. Sometimes this occurs when standing rapidly. Orthostatic blood pressure was checked in the office with no signs of orthostasis.  The patient is aware that I will retire on September 11, 2015. She lives in [place]. She will plan to follow with Dr. Rockey Situ at our [place] office.    Past Medical History  Diagnosis Date  . PONV (postoperative nausea and vomiting)   . Hypertension     a. takes Metoprolol, Lisinopril, and amlodipine.  . Hyperlipidemia     a. takes Simvastatin  . Palpitations     a. June 2013:   Event recorder normal sinus rhythm with rare PVCs - ? symptomatic PVC's.  . Low back pain   . Normal nuclear stress test     a. 07/2005 Nl nuc stress test.  . Ejection fraction     a. EF 55-65%, echo, mild LVH, June, 2013, small pericardial effusion  . Pericardial effusion     a. Small, echo, June, 2013    Past Surgical History  Procedure Laterality Date  . Tonsillectomy      as child  . Tubal ligation      58yrs ago  . Thyroidectomy, partial    . Vaginal hysterectomy      21yrs ago d/t cervical cancer  . Bladder suspension  2003  . Bone spur  2009    removed from right collar bone area and partial collar bone removed  . Back surgery  08/2010    Patient Active Problem List   Diagnosis Date Noted  . Pre-syncope 05/17/2015  . Dyspnea on exertion 03/31/2015  . Essential hypertension 03/31/2015  . Bleeding stomach ulcer 10/14/2013  . Arthritis 10/14/2013  . Anemia, iron deficiency 10/14/2013  . Depression 10/14/2013  . Weakness generalized 10/14/2013  . Ejection fraction   . Pericardial effusion   . Abnormal EKG   . PONV (postoperative nausea and vomiting)   . Hypertension   . Hyperlipidemia   . Palpitations   . Low back pain   . Normal nuclear stress test       Current Outpatient Prescriptions  Medication Sig Dispense Refill  . amLODipine (NORVASC) 5  MG tablet Take 5 mg by mouth daily.    Marland Kitchen BIOTIN PO Take 5,000 mcg by mouth daily.     . citalopram (CELEXA) 20 MG tablet Take 20 mg by mouth daily.    . diazepam (VALIUM) 5 MG tablet Take 5 mg by mouth 4 (four) times daily as needed.    . fish oil-omega-3 fatty acids 1000 MG capsule Take 1 g by mouth daily.      Marland Kitchen HYDROcodone-acetaminophen (NORCO) 10-325 MG per tablet Take 1 tablet by mouth 4 x a day as needed for pain **NOT TO EXCEED 4 GM TYLENOL IN 24 HOURS** 180 tablet 0  . lisinopril (PRINIVIL,ZESTRIL) 40 MG tablet Take 40 mg by mouth daily.    . metoprolol tartrate (LOPRESSOR) 25 MG tablet Take 25 mg by mouth 2  (two) times daily.    Marland Kitchen omeprazole (PRILOSEC) 20 MG capsule Take 20 mg by mouth. Takes every other day    . simvastatin (ZOCOR) 20 MG tablet Take 20 mg by mouth daily.    . TRAZODONE HCL PO Take 250 mg by mouth at bedtime.     No current facility-administered medications for this visit.    Allergies:   Anti-inflammatory enzyme; Cymbalta; Clarithromycin; Clarithromycin; Nsaids; Ranitidine; and Venlafaxine    Social History:  The patient  reports that she has never smoked. She has never used smokeless tobacco. She reports that she drinks alcohol. She reports that she does not use illicit drugs.   Family History:  The patient's family history includes Angina in her mother; Atrial fibrillation in her brother; COPD in her father; Colon cancer in her mother; Heart disease in her father and mother; Heart murmur in her mother. There is no history of Hypotension, Malignant hyperthermia, or Pseudochol deficiency.    ROS:  Please see the history of present illness.    Patient denies fever, chills, headache, sweats, rash, change in vision, change in hearing, chest pain, cough, nausea or vomiting, urinary symptoms. All other systems are reviewed and are negative.    PHYSICAL EXAM: VS:  BP 114/70 mmHg  Pulse 69  Ht 5\' 7"  (1.702 m)  Wt 188 lb 3.2 oz (85.367 kg)  BMI 29.47 kg/m2  SpO2 97% , Patient is oriented to person time and place. Affect is normal. Head is atraumatic. Sclera and conjunctiva are normal. There is no jugular venous distention. Lungs are clear. Respiratory effort is nonlabored. Cardiac exam revealed an S1 and S2. The abdomen was soft. There is no peripheral edema. There are no musculoskeletal deformities. There are no skin rashes. Orthostatic blood pressure checks were done showing no signs of orthostasis.  EKG:   EKG was not done today.   Recent Labs: 10/28/2014: BUN 16; Creatinine 1.28; Hemoglobin 14.7; Platelets 258; Potassium 4.1; Sodium 136 03/31/2015: Magnesium 2.0    Lipid  Panel No results found for: CHOL, TRIG, HDL, CHOLHDL, VLDL, LDLCALC, LDLDIRECT    Wt Readings from Last 3 Encounters:  05/18/15 188 lb 3.2 oz (85.367 kg)  04/15/15 182 lb (82.555 kg)  03/31/15 182 lb (82.555 kg)      Current medicines are reviewed  The patient understands her medications.     ASSESSMENT AND PLAN:

## 2015-05-18 NOTE — Assessment & Plan Note (Signed)
The patient has been fully evaluated. Echo and nuclear are normal. Event recorder is normal. She does not have orthostatic blood pressure drops. I have ordered a plasma metanephrine to be complete. At this point I would recommend no other workup. If her symptoms become worse or more clear-cut, further evaluation could be done.  As part of today's evaluation I spent far greater than 25 minutes with her total evaluation. More than half of 25 minutes was spent with direct contact with the patient. We discussed a multitude of issues.

## 2015-05-18 NOTE — Assessment & Plan Note (Signed)
The patient's event recorder reveals no significant arrhythmias. No further workup.

## 2015-05-18 NOTE — Patient Instructions (Signed)
**Note De-Identified  Obfuscation** Medication Instructions:  Same-no changes  Labwork: Plasm Metanephrine-today  Testing/Procedures: None  Follow-Up: Your physician wants you to follow-up in: 6 month with Dr Karl Bales in our [place] office. You will receive a reminder letter in the mail two months in advance. If you don't receive a letter, please call our office to schedule the follow-up appointment.

## 2015-05-20 LAB — METANEPHRINES, PLASMA

## 2015-05-25 ENCOUNTER — Other Ambulatory Visit (INDEPENDENT_AMBULATORY_CARE_PROVIDER_SITE_OTHER): Payer: Medicare Other | Admitting: *Deleted

## 2015-05-25 DIAGNOSIS — N3941 Urge incontinence: Secondary | ICD-10-CM | POA: Diagnosis not present

## 2015-05-25 DIAGNOSIS — R002 Palpitations: Secondary | ICD-10-CM

## 2015-05-28 LAB — METANEPHRINES, PLASMA
Metanephrine, Free: <25 pg/mL (ref <=57)
Normetanephrine, Free: 149 pg/mL — ABNORMAL HIGH (ref <=148)
Total Metanephrines-Plasma: 149 pg/mL (ref <=205)

## 2015-06-01 ENCOUNTER — Telehealth: Payer: Self-pay | Admitting: Cardiology

## 2015-06-01 DIAGNOSIS — N3941 Urge incontinence: Secondary | ICD-10-CM | POA: Diagnosis not present

## 2015-06-01 NOTE — Telephone Encounter (Signed)
New Message  Pt returning Lynn's p[hone call

## 2015-06-01 NOTE — Telephone Encounter (Signed)
The pt has been given her most recent lab results and she verbalized understanding.

## 2015-06-02 DIAGNOSIS — F4320 Adjustment disorder, unspecified: Secondary | ICD-10-CM | POA: Diagnosis not present

## 2015-06-08 DIAGNOSIS — N3941 Urge incontinence: Secondary | ICD-10-CM | POA: Diagnosis not present

## 2015-06-08 DIAGNOSIS — N3946 Mixed incontinence: Secondary | ICD-10-CM | POA: Diagnosis not present

## 2015-06-11 DIAGNOSIS — F4320 Adjustment disorder, unspecified: Secondary | ICD-10-CM | POA: Diagnosis not present

## 2015-06-16 DIAGNOSIS — F4320 Adjustment disorder, unspecified: Secondary | ICD-10-CM | POA: Diagnosis not present

## 2015-06-29 DIAGNOSIS — N3941 Urge incontinence: Secondary | ICD-10-CM | POA: Diagnosis not present

## 2015-07-01 DIAGNOSIS — N362 Urethral caruncle: Secondary | ICD-10-CM | POA: Diagnosis not present

## 2015-07-01 DIAGNOSIS — N3000 Acute cystitis without hematuria: Secondary | ICD-10-CM | POA: Diagnosis not present

## 2015-07-08 DIAGNOSIS — R5381 Other malaise: Secondary | ICD-10-CM | POA: Diagnosis not present

## 2015-07-08 DIAGNOSIS — N3000 Acute cystitis without hematuria: Secondary | ICD-10-CM | POA: Diagnosis not present

## 2015-07-20 DIAGNOSIS — N3941 Urge incontinence: Secondary | ICD-10-CM | POA: Diagnosis not present

## 2015-07-30 ENCOUNTER — Other Ambulatory Visit: Payer: Self-pay | Admitting: Family Medicine

## 2015-07-30 ENCOUNTER — Ambulatory Visit (INDEPENDENT_AMBULATORY_CARE_PROVIDER_SITE_OTHER): Payer: Medicare Other | Admitting: Family Medicine

## 2015-07-30 ENCOUNTER — Encounter: Payer: Self-pay | Admitting: Family Medicine

## 2015-07-30 VITALS — BP 112/60 | HR 78 | Temp 97.3°F | Resp 16 | Wt 194.0 lb

## 2015-07-30 DIAGNOSIS — G250 Essential tremor: Secondary | ICD-10-CM | POA: Insufficient documentation

## 2015-07-30 DIAGNOSIS — E785 Hyperlipidemia, unspecified: Secondary | ICD-10-CM

## 2015-07-30 DIAGNOSIS — R600 Localized edema: Secondary | ICD-10-CM

## 2015-07-30 DIAGNOSIS — F329 Major depressive disorder, single episode, unspecified: Secondary | ICD-10-CM

## 2015-07-30 DIAGNOSIS — F3341 Major depressive disorder, recurrent, in partial remission: Secondary | ICD-10-CM | POA: Insufficient documentation

## 2015-07-30 DIAGNOSIS — M1990 Unspecified osteoarthritis, unspecified site: Secondary | ICD-10-CM | POA: Insufficient documentation

## 2015-07-30 DIAGNOSIS — M791 Myalgia, unspecified site: Secondary | ICD-10-CM

## 2015-07-30 DIAGNOSIS — K219 Gastro-esophageal reflux disease without esophagitis: Secondary | ICD-10-CM | POA: Insufficient documentation

## 2015-07-30 DIAGNOSIS — M5416 Radiculopathy, lumbar region: Secondary | ICD-10-CM | POA: Insufficient documentation

## 2015-07-30 DIAGNOSIS — K279 Peptic ulcer, site unspecified, unspecified as acute or chronic, without hemorrhage or perforation: Secondary | ICD-10-CM | POA: Insufficient documentation

## 2015-07-30 DIAGNOSIS — I10 Essential (primary) hypertension: Secondary | ICD-10-CM | POA: Insufficient documentation

## 2015-07-30 DIAGNOSIS — R5383 Other fatigue: Secondary | ICD-10-CM

## 2015-07-30 DIAGNOSIS — F411 Generalized anxiety disorder: Secondary | ICD-10-CM | POA: Insufficient documentation

## 2015-07-30 DIAGNOSIS — D649 Anemia, unspecified: Secondary | ICD-10-CM | POA: Insufficient documentation

## 2015-07-30 DIAGNOSIS — G4700 Insomnia, unspecified: Secondary | ICD-10-CM | POA: Insufficient documentation

## 2015-07-30 DIAGNOSIS — F32A Depression, unspecified: Secondary | ICD-10-CM

## 2015-07-30 DIAGNOSIS — J309 Allergic rhinitis, unspecified: Secondary | ICD-10-CM | POA: Insufficient documentation

## 2015-07-30 DIAGNOSIS — R609 Edema, unspecified: Secondary | ICD-10-CM

## 2015-07-30 NOTE — Progress Notes (Signed)
Patient ID: Nancy Blackburn, female   DOB: 1946/07/07, 69 y.o.   MRN: [id number]    Subjective:  HPI Pt is her today for a follow of her chronic conditions. However she reports that she thinks her thyroid might be off. She only has half of a thyroid and she is not on anything. She has been having increase anxiety, bloating, cold intolerance, fatigue. She says that she does not have the energy to clean her house and does not want to do anything. She has gained 14 lbs since LOV thought it was the anti depressant at first but with the other sx thought it might be her thyroid.   She has also had increased swelling of her feet and legs. She is having to take her fluid pills (Lasix) every other day.  Routine labs 02/18/15 TSH was check in 02/2015 and it was 0.600  Prior to Admission medications   Medication Sig Start Date End Date Taking? Authorizing Provider  amLODipine (NORVASC) 5 MG tablet Take 5 mg by mouth daily. 02/26/15  Yes Historical Provider, MD  BIOTIN PO Take 5,000 mcg by mouth daily.    Yes Historical Provider, MD  citalopram (CELEXA) 20 MG tablet Take 20 mg by mouth daily. 02/12/15  Yes Historical Provider, MD  diazepam (VALIUM) 5 MG tablet Take 5 mg by mouth 4 (four) times daily as needed. 02/24/15  Yes Historical Provider, MD  fish oil-omega-3 fatty acids 1000 MG capsule Take 1 g by mouth daily.     Yes Historical Provider, MD  metoprolol tartrate (LOPRESSOR) 25 MG tablet Take 25 mg by mouth 2 (two) times daily. 03/11/15  Yes Historical Provider, MD  TRAZODONE HCL PO Take 250 mg by mouth at bedtime.   Yes Historical Provider, MD  lisinopril (PRINIVIL,ZESTRIL) 40 MG tablet Take 40 mg by mouth daily. 01/30/15   Historical Provider, MD  simvastatin (ZOCOR) 20 MG tablet Take 20 mg by mouth daily. 01/30/15   Historical Provider, MD    Patient Active Problem List   Diagnosis Date Noted  . Absolute anemia 07/30/2015  . BP (high blood pressure) 07/30/2015  . Edema extremities 07/30/2015  .  Benign essential tremor 07/30/2015  . Anxiety, generalized 07/30/2015  . Gastro-esophageal reflux disease without esophagitis 07/30/2015  . HLD (hyperlipidemia) 07/30/2015  . Cannot sleep 07/30/2015  . Lumbar radiculopathy 07/30/2015  . Depression, major, recurrent, in partial remission 07/30/2015  . Arthritis, degenerative 07/30/2015  . Allergic rhinitis 07/30/2015  . Gastroduodenal ulcer 07/30/2015  . Calcium blood increased 07/30/2015  . Pre-syncope 05/17/2015  . Dyspnea on exertion 03/31/2015  . Essential hypertension 03/31/2015  . Bleeding stomach ulcer 10/14/2013  . Arthritis 10/14/2013  . Anemia, iron deficiency 10/14/2013  . Depression 10/14/2013  . Weakness generalized 10/14/2013  . Ejection fraction   . Pericardial effusion   . PONV (postoperative nausea and vomiting)   . Hyperlipidemia   . Palpitations   . Low back pain   . Normal nuclear stress test     Past Medical History  Diagnosis Date  . PONV (postoperative nausea and vomiting)   . Hypertension     a. takes Metoprolol, Lisinopril, and amlodipine.  . Hyperlipidemia     a. takes Simvastatin  . Palpitations     a. June 2013:  Event recorder normal sinus rhythm with rare PVCs - ? symptomatic PVC's.  . Low back pain   . Normal nuclear stress test     a. 07/2005 Nl nuc stress test.  . Ejection  fraction     a. EF 55-65%, echo, mild LVH, June, 2013, small pericardial effusion  . Pericardial effusion     a. Small, echo, June, 2013    Social History   Social History  . Marital Status: Divorced    Spouse Name: N/A  . Number of Children: N/A  . Years of Education: N/A   Occupational History  . Not on file.   Social History Main Topics  . Smoking status: Never Smoker   . Smokeless tobacco: Never Used  . Alcohol Use: 0.0 oz/week    0 Glasses of wine per week     Comment: drinks 3-4 times a year  . Drug Use: No  . Sexual Activity: Not on file   Other Topics Concern  . Not on file   Social History  Narrative    Allergies  Allergen Reactions  . Anti-Inflammatory Enzyme [Nutritional Supplements] Other (See Comments)    REACTION: Due to bleeding ulcer  . Cymbalta [Duloxetine Hcl] Other (See Comments)    REACTION: Urinary retention  . Clarithromycin Other (See Comments)    Upsets stomach  . Clarithromycin     GI upset  . Nsaids     GI BLEED  . Ranitidine     severe diarrhea  . Venlafaxine     severe depression    Review of Systems  Constitutional: Positive for malaise/fatigue.  HENT: Negative.   Eyes: Negative.   Respiratory: Negative.   Cardiovascular: Positive for leg swelling.  Gastrointestinal:       Bloated  Genitourinary: Negative.   Musculoskeletal: Negative.   Skin: Negative.   Neurological: Negative.   Endo/Heme/Allergies:       Cold intolerance  Psychiatric/Behavioral: Positive for depression. The patient is nervous/anxious.     Immunization History  Administered Date(s) Administered  . Influenza-Unspecified 10/12/2013, 08/12/2014   Objective:  BP 112/60 mmHg  Pulse 78  Temp(Src) 97.3 F (36.3 C) (Oral)  Resp 16  Wt 194 lb (87.998 kg)  Physical Exam  Constitutional: She is well-developed, well-nourished, and in no distress.  HENT:  Head: Normocephalic and atraumatic.  Right Ear: External ear normal.  Left Ear: External ear normal.  Nose: Nose normal.  Eyes: Conjunctivae are normal.  Neck: Neck supple.  Cardiovascular: Normal rate, regular rhythm and normal heart sounds.   Pulmonary/Chest: Effort normal and breath sounds normal.  Abdominal: Soft.  Neurological: She is alert. Gait normal.  Skin: Skin is warm.  Psychiatric: Mood, memory, affect and judgment normal.    Lab Results  Component Value Date   WBC 8.3 10/28/2014   HGB 14.7 10/28/2014   HCT 44.7 10/28/2014   PLT 258 10/28/2014   GLUCOSE 107* 10/28/2014   INR 0.94 09/02/2010    CMP     Component Value Date/Time   NA 136 10/28/2014 1755   NA 140 11/25/2011 0608   K 4.1  10/28/2014 1755   K 3.8 11/25/2011 0608   CL 102 10/28/2014 1755   CL 107 11/25/2011 0608   CO2 34* 10/28/2014 1755   CO2 27 11/25/2011 0608   GLUCOSE 107* 10/28/2014 1755   GLUCOSE 125* 11/25/2011 0608   BUN 16 10/28/2014 1755   BUN 7 11/25/2011 0608   CREATININE 1.28 10/28/2014 1755   CREATININE 0.77 11/25/2011 0608   CALCIUM 9.1 10/28/2014 1755   CALCIUM 8.5 11/25/2011 0608   PROT 4.8* 10/05/2013 0553   PROT 6.1 09/02/2010 1129   ALBUMIN 3.6 10/28/2014 1755   ALBUMIN 3.7 09/02/2010 1129  AST 13* 10/05/2013 0553   AST 26 09/02/2010 1129   ALT 8* 10/05/2013 0553   ALT 20 09/02/2010 1129   ALKPHOS 73 10/05/2013 0553   ALKPHOS 114 09/02/2010 1129   BILITOT 0.2 10/05/2013 0553   BILITOT 0.4 09/02/2010 1129   GFRNONAA 44* 10/28/2014 1755   GFRNONAA >60 10/06/2013 0518   GFRNONAA 86* 11/25/2011 0608   GFRAA 53* 10/28/2014 1755   GFRAA >60 10/06/2013 0518   GFRAA >90 11/25/2011 0608    Assessment and Plan :  1. Essential hypertension  - CBC with Differential/Platelet  2. Hyperlipidemia  - COMPLETE METABOLIC PANEL WITH GFR  3. Depression Per psychiatry   4. Edema extremities Stop amlodipine. Refer to vascular surgery if this persists to a significant degree after stopping Norvasc.  5. HLD (hyperlipidemia)   6. Other fatigue Check labs. Right now her symptoms are very nonspecific.Burnis Medin follow this and workup as indicated by sore findings.  - CBC with Differential/Platelet - COMPLETE METABOLIC PANEL WITH GFR - TSH  7. Myalgia  - CK (Creatine Kinase) -sed rate Patient was seen and examined by Dr. Miguel Aschoff, and noted scribed by Webb Laws, CMA I have done the exam and reviewed the above chart and it is accurate to the best of my knowledge.   Miguel Aschoff MD [facility] 07/30/2015 4:00 PM

## 2015-07-30 NOTE — Patient Instructions (Signed)
Stop Amlodipine (blood pressure) and Simvastatin (Cholesterol)

## 2015-07-31 LAB — CBC WITH DIFFERENTIAL/PLATELET
Basophils Absolute: 0.1 10*3/uL (ref 0.0–0.2)
Basos: 1 %
EOS (ABSOLUTE): 0.3 10*3/uL (ref 0.0–0.4)
Eos: 3 %
Hematocrit: 40.7 % (ref 34.0–46.6)
Hemoglobin: 13.6 g/dL (ref 11.1–15.9)
Immature Grans (Abs): 0 10*3/uL (ref 0.0–0.1)
Immature Granulocytes: 0 %
Lymphocytes Absolute: 2.6 10*3/uL (ref 0.7–3.1)
Lymphs: 31 %
MCH: 29.7 pg (ref 26.6–33.0)
MCHC: 33.4 g/dL (ref 31.5–35.7)
MCV: 89 fL (ref 79–97)
Monocytes Absolute: 1.2 10*3/uL — ABNORMAL HIGH (ref 0.1–0.9)
Monocytes: 14 %
Neutrophils Absolute: 4.3 10*3/uL (ref 1.4–7.0)
Neutrophils: 51 %
Platelets: 268 10*3/uL (ref 150–379)
RBC: 4.58 x10E6/uL (ref 3.77–5.28)
RDW: 12.7 % (ref 12.3–15.4)
WBC: 8.5 10*3/uL (ref 3.4–10.8)

## 2015-07-31 LAB — COMPREHENSIVE METABOLIC PANEL
ALT: 15 IU/L (ref 0–32)
AST: 18 IU/L (ref 0–40)
Albumin/Globulin Ratio: 1.6 (ref 1.1–2.5)
Albumin: 4.1 g/dL (ref 3.6–4.8)
Alkaline Phosphatase: 90 IU/L (ref 39–117)
BUN/Creatinine Ratio: 20 (ref 11–26)
BUN: 18 mg/dL (ref 8–27)
Bilirubin Total: 0.2 mg/dL (ref 0.0–1.2)
CO2: 26 mmol/L (ref 18–29)
Calcium: 9.6 mg/dL (ref 8.7–10.3)
Chloride: 100 mmol/L (ref 97–108)
Creatinine, Ser: 0.89 mg/dL (ref 0.57–1.00)
GFR calc Af Amer: 76 mL/min/{1.73_m2} (ref >59)
GFR calc non Af Amer: 66 mL/min/{1.73_m2} (ref >59)
Globulin, Total: 2.5 g/dL (ref 1.5–4.5)
Glucose: 106 mg/dL — ABNORMAL HIGH (ref 65–99)
Potassium: 4.8 mmol/L (ref 3.5–5.2)
Sodium: 140 mmol/L (ref 134–144)
Total Protein: 6.6 g/dL (ref 6.0–8.5)

## 2015-07-31 LAB — TSH: TSH: 0.783 u[IU]/mL (ref 0.450–4.500)

## 2015-07-31 LAB — CK: Total CK: 65 U/L (ref 24–173)

## 2015-07-31 LAB — SEDIMENTATION RATE: Sed Rate: 4 mm/hr (ref 0–40)

## 2015-08-03 ENCOUNTER — Ambulatory Visit (INDEPENDENT_AMBULATORY_CARE_PROVIDER_SITE_OTHER): Payer: Medicare Other | Admitting: Physician Assistant

## 2015-08-03 ENCOUNTER — Encounter: Payer: Self-pay | Admitting: Physician Assistant

## 2015-08-03 VITALS — BP 124/68 | HR 81 | Temp 97.9°F | Resp 16 | Wt 193.6 lb

## 2015-08-03 DIAGNOSIS — M3501 Sicca syndrome with keratoconjunctivitis: Secondary | ICD-10-CM | POA: Diagnosis not present

## 2015-08-03 DIAGNOSIS — R300 Dysuria: Secondary | ICD-10-CM | POA: Diagnosis not present

## 2015-08-03 DIAGNOSIS — F331 Major depressive disorder, recurrent, moderate: Secondary | ICD-10-CM | POA: Diagnosis not present

## 2015-08-03 DIAGNOSIS — R350 Frequency of micturition: Secondary | ICD-10-CM

## 2015-08-03 DIAGNOSIS — R1031 Right lower quadrant pain: Secondary | ICD-10-CM | POA: Diagnosis not present

## 2015-08-03 DIAGNOSIS — F0150 Vascular dementia without behavioral disturbance: Secondary | ICD-10-CM | POA: Diagnosis not present

## 2015-08-03 DIAGNOSIS — M545 Low back pain: Secondary | ICD-10-CM

## 2015-08-03 DIAGNOSIS — R3915 Urgency of urination: Secondary | ICD-10-CM | POA: Diagnosis not present

## 2015-08-03 DIAGNOSIS — F5105 Insomnia due to other mental disorder: Secondary | ICD-10-CM | POA: Diagnosis not present

## 2015-08-03 LAB — POCT URINALYSIS DIPSTICK
Bilirubin, UA: NEGATIVE
Blood, UA: NEGATIVE
Glucose, UA: NEGATIVE
Ketones, UA: NEGATIVE
Leukocytes, UA: NEGATIVE
Nitrite, UA: NEGATIVE
Protein, UA: NEGATIVE
Spec Grav, UA: 1.02
Urobilinogen, UA: 0.2
pH, UA: 6

## 2015-08-03 NOTE — Patient Instructions (Addendum)
Abdominal or Pelvic Ultrasound Ultrasound uses harmless sound waves instead of X-rays to take pictures of the inside of your body. A probe or wand device (transducer) is held up against your body to capture these pictures. The continually changing images can be recorded on videotape or film. Diagnostic ultrasound imaging is commonly called sonography or ultrasonography. There are different types of ultrasound exams. An ultrasound of the gallbladder, liver, and pancreas can show gallstones, masses, cysts, inflammation, infection, or enlarged organs. An ultrasound of the kidneys can show cysts, masses, kidney stones, and kidney size and shape. A pelvic ultrasound can show the uterus, ovaries, and cysts or masses. An obstetrical ultrasound shows the position of the fetus, measurements for maturity, fetal heartbeat, and fetal organs. A breast, thyroid, or testicular ultrasound can show if a nodule is solid or cystic. RISKS AND COMPLICATIONS Ultrasound has been used for many years and has never shown any harmful effects. Studies in humans have shown no direct link between the use of ultrasound and adverse outcomes. BEFORE THE PROCEDURE Other than drinking water, do not eat or drink for 8 to 12 hours before the test or as directed by your caregiver. Follow any other diet instructions from your caregiver. If you are having a pelvic ultrasound, you may need to drink a lot of liquid before the exam. A full bladder helps to see the organs behind the bladder better. PROCEDURE  There is no pain in an ultrasound exam. A gel is applied to your skin, and the transducer is then placed on the area to be examined. The gel may feel cool. The gel wipes off easily, but it is a good idea to wear clothing that is easily washable. The images from inside your body are displayed on one or more monitors that look like small television screens. The returning sound waves produce pictures of the organs that were in the path of the sound  sent from the transducer. The room is usually darkened during the exam. This makes it easier to see the images on the monitor. The ultrasound exam should take less than 1 hour. AFTER THE PROCEDURE You can safely drive home and return to regular activities immediately after the exam. Ask when your test results will be ready. Make sure you get your test results. Document Released: 11/25/2000 Document Revised: 02/20/2012 Document Reviewed: 05/19/2011 [facility] Patient Information 2015 [place], [place]. This information is not intended to replace advice given to you by your health care provider. Make sure you discuss any questions you have with your health care provider. Urinary Frequency The number of times a normal person urinates depends upon how much liquid they take in and how much liquid they are losing. If the temperature is hot and there is high humidity, then the person will sweat more and usually breathe a little more frequently. These factors decrease the amount of frequency of urination that would be considered normal. The amount you drink is easily determined, but the amount of fluid lost is sometimes more difficult to calculate.  Fluid is lost in two ways:  Sensible fluid loss is usually measured by the amount of urine that you get rid of. Losses of fluid can also occur with diarrhea.  Insensible fluid loss is more difficult to measure. It is caused by evaporation. Insensible loss of fluid occurs through breathing and sweating. It usually ranges from a little less than a quart to a little more than a quart of fluid a day. In normal temperatures and activity levels,  the average person may urinate 4 to 7 times in a 24-hour period. Needing to urinate more often than that could indicate a problem. If one urinates 4 to 7 times in 24 hours and has large volumes each time, that could indicate a different problem from one who urinates 4 to 7 times a day and has small volumes. The time of urinating is  also important. Most urinating should be done during the waking hours. Getting up at night to urinate frequently can indicate some problems. CAUSES  The bladder is the organ in your lower abdomen that holds urine. Like a balloon, it swells some as it fills up. Your nerves sense this and tell you it is time to head for the bathroom. There are a number of reasons that you might feel the need to urinate more often than usual. They include:  Urinary tract infection. This is usually associated with other signs such as burning when you urinate.  In men, problems with the prostate (a walnut-size gland that is located near the tube that carries urine out of your body). There are two reasons why the prostate can cause an increased frequency of urination:  An enlarged prostate that does not let the bladder empty well. If the bladder only half empties when you urinate, then it only has half the capacity to fill before you have to urinate again.  The nerves in the bladder become more hypersensitive with an increased size of the prostate even if the bladder empties completely.  Pregnancy.  Obesity. Excess weight is more likely to cause a problem for women than for men.  Bladder stones or other bladder problems.  Caffeine.  Alcohol.  Medications. For example, drugs that help the body get rid of extra fluid (diuretics) increase urine production. Some other medicines must be taken with lots of fluids.  Muscle or nerve weakness. This might be the result of a spinal cord injury, a stroke, multiple sclerosis, or Parkinson disease.  Long-standing diabetes can decrease the sensation of the bladder. This loss of sensation makes it harder to sense the bladder needs to be emptied. Over a period of years, the bladder is stretched out by constant overfilling. This weakens the bladder muscles so that the bladder does not empty well and has less capacity to fill with new urine.  Interstitial cystitis (also called  painful bladder syndrome). This condition develops because the tissues that line the inside of the bladder are inflamed (inflammation is the body's way of reacting to injury or infection). It causes pain and frequent urination. It occurs in women more often than in men. DIAGNOSIS   To decide what might be causing your urinary frequency, your health care provider will probably:  Ask about symptoms you have noticed.  Ask about your overall health. This will include questions about any medications you are taking.  Do a physical examination.  Order some tests. These might include:  A blood test to check for diabetes or other health issues that could be contributing to the problem.  Urine testing. This could measure the flow of urine and the pressure on the bladder.  A test of your neurological system (the brain, spinal cord, and nerves). This is the system that senses the need to urinate.  A bladder test to check whether it is emptying completely when you urinate.  Cystoscopy. This test uses a thin tube with a tiny camera on it. It offers a look inside your urethra and bladder to see if there  are problems.  Imaging tests. You might be given a contrast dye and then asked to urinate. X-rays are taken to see how your bladder is working. TREATMENT  It is important for you to be evaluated to determine if the amount or frequency that you have is unusual or abnormal. If it is found to be abnormal, the cause should be determined and this can usually be found out easily. Depending upon the cause, treatment could include medication, stimulation of the nerves, or surgery. There are not too many things that you can do as an individual to change your urinary frequency. It is important that you balance the amount of fluid intake needed to compensate for your activity and the temperature. Medical problems will be diagnosed and taken care of by your physician. There is no particular bladder training such as  Kegel exercises that you can do to help urinary frequency. This is an exercise that is usually recommended for people who have leaking of urine when they laugh, cough, or sneeze. HOME CARE INSTRUCTIONS   Take any medications your health care provider prescribed or suggested. Follow the directions carefully.  Practice any lifestyle changes that are recommended. These might include:  Drinking less fluid or drinking at different times of the day. If you need to urinate often during the night, for example, you may need to stop drinking fluids early in the evening.  Cutting down on caffeine or alcohol. They both can make you need to urinate more often than normal. Caffeine is found in coffee, tea, and sodas.  Losing weight, if that is recommended.  Keep a journal or a log. You might be asked to record how much you drink and when and where you feel the need to urinate. This will also help evaluate how well the treatment provided by your physician is working. SEEK MEDICAL CARE IF:   Your need to urinate often gets worse.  You feel increased pain or irritation when you urinate.  You notice blood in your urine.  You have questions about any medications that your health care provider recommended.  You notice blood, pus, or swelling at the site of any test or treatment procedure.  You develop a fever of more than 100.73F (38.1C). SEEK IMMEDIATE MEDICAL CARE IF:  You develop a fever of more than 102.58F (38.9C). Document Released: 09/24/2009 Document Revised: 04/14/2014 Document Reviewed: 09/24/2009 [facility] Patient Information 2015 [place], [place]. This information is not intended to replace advice given to you by your health care provider. Make sure you discuss any questions you have with your health care provider.

## 2015-08-03 NOTE — Progress Notes (Signed)
Subjective:    Patient ID: Nancy Blackburn, female    DOB: 1946-10-01, 69 y.o.   MRN: [id number]  Urinary Frequency  This is a recurrent problem. The current episode started 1 to 4 weeks ago. The problem occurs every urination. The problem has been gradually worsening. The quality of the pain is described as aching and burning. The pain is at a severity of 4/10. There has been no fever. She is not sexually active. There is no history of pyelonephritis. Associated symptoms include frequency, nausea (today) and urgency. Pertinent negatives include no chills, discharge, flank pain, hematuria, hesitancy, possible pregnancy, sweats or vomiting. She has tried increased fluids for the symptoms. The treatment provided no relief. Her past medical history is significant for kidney stones, recurrent UTIs and a urological procedure. There is no history of catheterization, a single kidney or urinary stasis.  Back Pain This is a new problem. The current episode started today. Episode frequency: with urination. The problem is unchanged. The pain is present in the lumbar spine and gluteal. The quality of the pain is described as aching and cramping. The pain does not radiate. The pain is at a severity of 4/10. Exacerbated by: urination. Associated symptoms include bladder incontinence (this is baseline), dysuria and pelvic pain (right sided). Pertinent negatives include no abdominal pain, bowel incontinence, chest pain, fever, headaches, leg pain, numbness, paresis, paresthesias, perianal numbness, tingling, weakness or weight loss. The treatment provided no relief.   she has been dealing with the urinary frequency since 06/08/2015. She is followed by Dr. Gaynelle Arabian at [facility] and sees him for treatments for urinary retention. Since her treatment on June 27 she has dealt with urinary frequency and incontinence. Her most recent treatment was on 07/20/2015. She follows back up with urology on 08/10/2015. She  states that the urinary frequency has increased even more so since her most recent treatment. She states last night she got up 5 times for urination. 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 She did undergo a hysterectomy for this. She was previously followed by Dr. Enzo Bi but she has not seen him recently. She is currently having right-sided lower abdominal pain that radiates to the back. This is new and she states started just this weekend. She does have a history of kidney stone, but states this does not feel anything like a kidney stone.    Review of Systems  Constitutional: Negative for fever, chills and weight loss.  Cardiovascular: Negative for chest pain.  Gastrointestinal: Positive for nausea (today). Negative for vomiting, abdominal pain and bowel incontinence.  Genitourinary: Positive for bladder incontinence (this is baseline), dysuria, urgency, frequency and pelvic pain (right sided). Negative for hesitancy, hematuria and flank pain.  Musculoskeletal: Positive for back pain.  Neurological: Negative for tingling, weakness, numbness, headaches and paresthesias.       Objective:   Physical Exam  Constitutional: She is oriented to person, place, and time. She appears well-developed and well-nourished. No distress.  Cardiovascular: Normal rate, regular rhythm and normal heart sounds.  Exam reveals no gallop and no friction rub.   No murmur heard. Pulmonary/Chest: Effort normal and breath sounds normal. No respiratory distress. She has no wheezes. She has no rales.  Abdominal: Soft. Normal appearance and bowel sounds are normal. She exhibits no distension and no mass. There is no hepatosplenomegaly. There is tenderness in the right lower quadrant and suprapubic area. There is no rebound, no guarding, no CVA tenderness, no  tenderness at McBurney's point and negative Murphy's sign.  Suprapubic tenderness  Neurological: She is alert and oriented to person,  place, and time.  Skin: Skin is warm and dry. She is not diaphoretic.  Vitals reviewed.         Assessment & Plan:  1. Frequency of urination UA was negative today in the office. I will send the urine for culture just due to her symptoms. I did advise her to go ahead and begin taking the Azo tablets. If the urine culture comes back positive I will call and antibody treatment as directed by the sensitivities. She is to call the office if she has any worsening symptoms. She does follow-up with urology on 08/10/2015. - POCT urinalysis dipstick - Urine Culture  2. Urgency of urination See above treatment plan. - POCT urinalysis dipstick  3. Low back pain without sciatica, unspecified back pain laterality She is having right-sided low back pain that seems to be radiating from the right lower abdominal quadrant. Due to her history of carcinoma in situ and status post hysterectomy without oophorectomy I do question possible right sided ovarian cyst versus mass. I will obtain a pelvic ultrasound and transvaginal ultrasound to rule out any ovarian mass. - POCT urinalysis dipstick - US Pelvis Complete; Future  4. Dysuria See above for frequency of urination treatment plan. - Urine Culture - US Pelvis Complete; Future  5. RLQ abdominal pain She is having right sided lower quadrant pain with radiation to her low back. I will obtain complete pelvis ultrasound and transvaginal ultrasound to rule out any right-sided ovarian mass. - US Pelvis Complete; Future - US Transvaginal Non-OB; Future

## 2015-08-05 LAB — URINE CULTURE

## 2015-08-06 ENCOUNTER — Telehealth: Payer: Self-pay

## 2015-08-06 NOTE — Telephone Encounter (Signed)
Patient advised as directed below. Patient verbalized understanding.  

## 2015-08-06 NOTE — Telephone Encounter (Signed)
-----   Message from Mar Daring, [place] sent at 08/05/2015  3:16 PM EDT ----- Urine culture was negative with only having normal urogenital bacteria to grow in small colonies.

## 2015-08-07 ENCOUNTER — Ambulatory Visit
Admission: RE | Admit: 2015-08-07 | Discharge: 2015-08-07 | Disposition: A | Discharge status: Home / Self Care | Payer: Medicare Other | Source: Ambulatory Visit | Attending: Physician Assistant | Admitting: Physician Assistant

## 2015-08-07 DIAGNOSIS — R1031 Right lower quadrant pain: Secondary | ICD-10-CM

## 2015-08-07 DIAGNOSIS — M545 Low back pain: Secondary | ICD-10-CM

## 2015-08-07 DIAGNOSIS — R300 Dysuria: Secondary | ICD-10-CM

## 2015-08-07 DIAGNOSIS — N8329 Other ovarian cysts: Secondary | ICD-10-CM | POA: Diagnosis not present

## 2015-08-10 ENCOUNTER — Ambulatory Visit: Payer: Self-pay | Admitting: Family Medicine

## 2015-08-10 DIAGNOSIS — N3941 Urge incontinence: Secondary | ICD-10-CM | POA: Diagnosis not present

## 2015-08-20 DIAGNOSIS — F0150 Vascular dementia without behavioral disturbance: Secondary | ICD-10-CM | POA: Diagnosis not present

## 2015-08-20 DIAGNOSIS — F5105 Insomnia due to other mental disorder: Secondary | ICD-10-CM | POA: Diagnosis not present

## 2015-08-20 DIAGNOSIS — F331 Major depressive disorder, recurrent, moderate: Secondary | ICD-10-CM | POA: Diagnosis not present

## 2015-08-21 DIAGNOSIS — M170 Bilateral primary osteoarthritis of knee: Secondary | ICD-10-CM | POA: Diagnosis not present

## 2015-08-31 ENCOUNTER — Encounter: Payer: Self-pay | Admitting: Family Medicine

## 2015-08-31 ENCOUNTER — Ambulatory Visit (INDEPENDENT_AMBULATORY_CARE_PROVIDER_SITE_OTHER): Payer: Medicare Other | Admitting: Family Medicine

## 2015-08-31 VITALS — BP 102/60 | HR 78 | Temp 98.3°F | Resp 12 | Wt 194.0 lb

## 2015-08-31 DIAGNOSIS — R609 Edema, unspecified: Secondary | ICD-10-CM

## 2015-08-31 DIAGNOSIS — R600 Localized edema: Secondary | ICD-10-CM

## 2015-08-31 DIAGNOSIS — I10 Essential (primary) hypertension: Secondary | ICD-10-CM | POA: Diagnosis not present

## 2015-08-31 DIAGNOSIS — J3089 Other allergic rhinitis: Secondary | ICD-10-CM

## 2015-08-31 DIAGNOSIS — N3941 Urge incontinence: Secondary | ICD-10-CM | POA: Diagnosis not present

## 2015-08-31 DIAGNOSIS — Z23 Encounter for immunization: Secondary | ICD-10-CM

## 2015-08-31 MED ORDER — MONTELUKAST SODIUM 10 MG PO TABS: 10.0000 mg | ORAL_TABLET | Freq: Every day | ORAL | Status: DC

## 2015-08-31 NOTE — Progress Notes (Signed)
Patient ID: Nancy Blackburn, female   DOB: 1946/05/03, 69 y.o.   MRN: [id number]    Subjective:  HPI  Patient is here for follow up;  Edema-stopped Amlodipine 1 month ago and swelling has improved significantly. She has not been checking her B//P personally but has had it checked at a doctors office almost once a week and it has been in good levels.  Fatigue-stopped cholesterol medication and she is not sure of this has really helped.  Patient has a mild chronic cough.  Prior to Admission medications   Medication Sig Start Date End Date Taking? Authorizing Provider  BIOTIN PO Take 5,000 mcg by mouth daily.    Yes Historical Provider, MD  citalopram (CELEXA) 40 MG tablet  08/06/15  Yes Historical Provider, MD  diazepam (VALIUM) 5 MG tablet Take 5 mg by mouth 4 (four) times daily as needed. 02/24/15  Yes Historical Provider, MD  fish oil-omega-3 fatty acids 1000 MG capsule Take 1 g by mouth daily.     Yes Historical Provider, MD  lisinopril (PRINIVIL,ZESTRIL) 40 MG tablet Take 40 mg by mouth daily. 01/30/15  Yes Historical Provider, MD  loratadine (CLARITIN) 10 MG tablet  08/20/15  Yes Historical Provider, MD  metoprolol tartrate (LOPRESSOR) 25 MG tablet Take 25 mg by mouth 2 (two) times daily. 03/11/15  Yes Historical Provider, MD  TRAZODONE HCL PO Take 250 mg by mouth at bedtime.   Yes Historical Provider, MD    Patient Active Problem List   Diagnosis Date Noted  . Absolute anemia 07/30/2015  . BP (high blood pressure) 07/30/2015  . Edema extremities 07/30/2015  . Benign essential tremor 07/30/2015  . Anxiety, generalized 07/30/2015  . Gastro-esophageal reflux disease without esophagitis 07/30/2015  . HLD (hyperlipidemia) 07/30/2015  . Cannot sleep 07/30/2015  . Lumbar radiculopathy 07/30/2015  . Depression, major, recurrent, in partial remission 07/30/2015  . Arthritis, degenerative 07/30/2015  . Allergic rhinitis 07/30/2015  . Gastroduodenal ulcer 07/30/2015  . Calcium blood  increased 07/30/2015  . Pre-syncope 05/17/2015  . Dyspnea on exertion 03/31/2015  . Essential hypertension 03/31/2015  . Bleeding stomach ulcer 10/14/2013  . Arthritis 10/14/2013  . Anemia, iron deficiency 10/14/2013  . Depression 10/14/2013  . Weakness generalized 10/14/2013  . Ejection fraction   . Pericardial effusion   . PONV (postoperative nausea and vomiting)   . Hyperlipidemia   . Palpitations   . Low back pain   . Normal nuclear stress test     Past Medical History  Diagnosis Date  . PONV (postoperative nausea and vomiting)   . Hypertension     a. takes Metoprolol, Lisinopril, and amlodipine.  . Hyperlipidemia     a. takes Simvastatin  . Palpitations     a. June 2013:  Event recorder normal sinus rhythm with rare PVCs - ? symptomatic PVC's.  . Low back pain   . Normal nuclear stress test     a. 07/2005 Nl nuc stress test.  . Ejection fraction     a. EF 55-65%, echo, mild LVH, June, 2013, small pericardial effusion  . Pericardial effusion     a. Small, echo, June, 2013    Social History   Social History  . Marital Status: Divorced    Spouse Name: N/A  . Number of Children: N/A  . Years of Education: N/A   Occupational History  . Not on file.   Social History Main Topics  . Smoking status: Never Smoker   . Smokeless tobacco: Never Used  .  Alcohol Use: 0.0 oz/week    0 Glasses of wine per week     Comment: drinks 3-4 times a year  . Drug Use: No  . Sexual Activity: No   Other Topics Concern  . Not on file   Social History Narrative    Allergies  Allergen Reactions  . Anti-Inflammatory Enzyme [Nutritional Supplements] Other (See Comments)    REACTION: Due to bleeding ulcer  . Cymbalta [Duloxetine Hcl] Other (See Comments)    REACTION: Urinary retention  . Clarithromycin Other (See Comments)    Upsets stomach  . Clarithromycin     GI upset  . Nsaids     GI BLEED  . Ranitidine     severe diarrhea  . Venlafaxine     severe depression     Review of Systems  Constitutional: Positive for malaise/fatigue.  Respiratory: Positive for cough and shortness of breath.   Cardiovascular: Negative.   Musculoskeletal: Positive for joint pain.  Neurological: Positive for weakness.  Psychiatric/Behavioral: Positive for depression.    Immunization History  Administered Date(s) Administered  . Influenza-Unspecified 10/12/2013, 08/12/2014   Objective:  BP 102/60 mmHg  Pulse 78  Temp(Src) 98.3 F (36.8 C)  Resp 12  Wt 194 lb (87.998 kg)  Physical Exam  Constitutional: She is oriented to person, place, and time and well-developed, well-nourished, and in no distress.  HENT:  Head: Normocephalic and atraumatic.  Right Ear: External ear normal.  Left Ear: External ear normal.  Nose: Nose normal.  Eyes: Conjunctivae are normal.  Neck: Neck supple.  Cardiovascular: Normal rate, regular rhythm and normal heart sounds.   Pulmonary/Chest: Effort normal and breath sounds normal.  Abdominal: Soft.  Neurological: She is alert and oriented to person, place, and time.  Skin: Skin is warm and dry.  Psychiatric: Mood, memory, affect and judgment normal.    Lab Results  Component Value Date   WBC 8.5 07/30/2015   HGB 14.7 10/28/2014   HCT 40.7 07/30/2015   PLT 258 10/28/2014   GLUCOSE 106* 07/30/2015   TSH 0.783 07/30/2015   INR 0.94 09/02/2010    CMP     Component Value Date/Time   NA 140 07/30/2015 1641   NA 136 10/28/2014 1755   NA 140 11/25/2011 0608   K 4.8 07/30/2015 1641   K 4.1 10/28/2014 1755   CL 100 07/30/2015 1641   CL 102 10/28/2014 1755   CO2 26 07/30/2015 1641   CO2 34* 10/28/2014 1755   GLUCOSE 106* 07/30/2015 1641   GLUCOSE 107* 10/28/2014 1755   GLUCOSE 125* 11/25/2011 0608   BUN 18 07/30/2015 1641   BUN 16 10/28/2014 1755   BUN 7 11/25/2011 0608   CREATININE 0.89 07/30/2015 1641   CREATININE 1.28 10/28/2014 1755   CALCIUM 9.6 07/30/2015 1641   CALCIUM 9.1 10/28/2014 1755   PROT 6.6  07/30/2015 1641   PROT 4.8* 10/05/2013 0553   PROT 6.1 09/02/2010 1129   ALBUMIN 3.6 10/28/2014 1755   ALBUMIN 3.7 09/02/2010 1129   AST 18 07/30/2015 1641   AST 13* 10/05/2013 0553   ALT 15 07/30/2015 1641   ALT 8* 10/05/2013 0553   ALKPHOS 90 07/30/2015 1641   ALKPHOS 73 10/05/2013 0553   BILITOT 0.2 07/30/2015 1641   BILITOT 0.2 10/05/2013 0553   BILITOT 0.4 09/02/2010 1129   GFRNONAA 66 07/30/2015 1641   GFRNONAA 44* 10/28/2014 1755   GFRNONAA >60 10/06/2013 0518   GFRAA 76 07/30/2015 1641   GFRAA 53* 10/28/2014 1755  GFRAA >60 10/06/2013 0518    Assessment and Plan :  1. Essential hypertension   2. Need for influenza vaccination  - Flu vaccine HIGH DOSE PF (Fluzone High dose)  3. Other allergic rhinitis Try montelukast 10 mg daily 4. Edema extremities Mild. We'll not diurese for fear of possibly damaging renal function. 4. Myalgias A little better off of Zocor 5. Hyperlipidemia  Miguel Aschoff MD [facility] 08/31/2015 1:40 PM

## 2015-09-01 DIAGNOSIS — Z23 Encounter for immunization: Secondary | ICD-10-CM | POA: Diagnosis not present

## 2015-09-01 DIAGNOSIS — R609 Edema, unspecified: Secondary | ICD-10-CM | POA: Diagnosis not present

## 2015-09-01 DIAGNOSIS — I10 Essential (primary) hypertension: Secondary | ICD-10-CM | POA: Diagnosis not present

## 2015-09-01 DIAGNOSIS — J3089 Other allergic rhinitis: Secondary | ICD-10-CM | POA: Diagnosis not present

## 2015-09-18 DIAGNOSIS — M545 Low back pain: Secondary | ICD-10-CM | POA: Diagnosis not present

## 2015-09-23 DIAGNOSIS — N3941 Urge incontinence: Secondary | ICD-10-CM | POA: Diagnosis not present

## 2015-10-12 DIAGNOSIS — N3941 Urge incontinence: Secondary | ICD-10-CM | POA: Diagnosis not present

## 2015-11-02 DIAGNOSIS — N3941 Urge incontinence: Secondary | ICD-10-CM | POA: Diagnosis not present

## 2015-11-18 DIAGNOSIS — F5105 Insomnia due to other mental disorder: Secondary | ICD-10-CM | POA: Diagnosis not present

## 2015-11-18 DIAGNOSIS — F331 Major depressive disorder, recurrent, moderate: Secondary | ICD-10-CM | POA: Diagnosis not present

## 2015-11-26 DIAGNOSIS — N3941 Urge incontinence: Secondary | ICD-10-CM | POA: Diagnosis not present

## 2015-12-17 ENCOUNTER — Ambulatory Visit: Payer: Medicare Other | Admitting: Nurse Practitioner

## 2015-12-18 DIAGNOSIS — N3946 Mixed incontinence: Secondary | ICD-10-CM | POA: Diagnosis not present

## 2015-12-18 DIAGNOSIS — N390 Urinary tract infection, site not specified: Secondary | ICD-10-CM | POA: Diagnosis not present

## 2015-12-18 DIAGNOSIS — Z0000 Encounter for general adult medical examination without abnormal findings: Secondary | ICD-10-CM | POA: Diagnosis not present

## 2015-12-22 DIAGNOSIS — N3941 Urge incontinence: Secondary | ICD-10-CM | POA: Diagnosis not present

## 2016-01-04 DIAGNOSIS — Z0000 Encounter for general adult medical examination without abnormal findings: Secondary | ICD-10-CM | POA: Diagnosis not present

## 2016-01-04 DIAGNOSIS — N3946 Mixed incontinence: Secondary | ICD-10-CM | POA: Diagnosis not present

## 2016-01-14 DIAGNOSIS — N3941 Urge incontinence: Secondary | ICD-10-CM | POA: Diagnosis not present

## 2016-01-22 ENCOUNTER — Other Ambulatory Visit: Payer: Self-pay

## 2016-01-22 DIAGNOSIS — Z1231 Encounter for screening mammogram for malignant neoplasm of breast: Secondary | ICD-10-CM

## 2016-02-01 ENCOUNTER — Ambulatory Visit (INDEPENDENT_AMBULATORY_CARE_PROVIDER_SITE_OTHER): Payer: Medicare Other | Admitting: Family Medicine

## 2016-02-01 VITALS — BP 142/76 | HR 60 | Temp 98.0°F | Resp 16 | Wt 195.0 lb

## 2016-02-01 DIAGNOSIS — R609 Edema, unspecified: Secondary | ICD-10-CM

## 2016-02-01 DIAGNOSIS — M791 Myalgia, unspecified site: Secondary | ICD-10-CM

## 2016-02-01 DIAGNOSIS — F419 Anxiety disorder, unspecified: Secondary | ICD-10-CM

## 2016-02-01 DIAGNOSIS — R600 Localized edema: Secondary | ICD-10-CM

## 2016-02-01 DIAGNOSIS — J3089 Other allergic rhinitis: Secondary | ICD-10-CM

## 2016-02-01 DIAGNOSIS — E785 Hyperlipidemia, unspecified: Secondary | ICD-10-CM

## 2016-02-01 MED ORDER — DIAZEPAM 5 MG PO TABS: 5.0000 mg | ORAL_TABLET | Freq: Four times a day (QID) | ORAL | Status: DC | PRN

## 2016-02-01 NOTE — Progress Notes (Signed)
Patient ID: Nancy Blackburn, female   DOB: 1946-04-21, 70 y.o.   MRN: [id number]   Nancy Blackburn  MRN: [id number] DOB: [birth year]  Subjective:  HPI   1. Edema extremities Patient is a 70 year old female who presents for follow up of edema in the lower extremities.  She was last seen on 08/31/15 and she was given a short course of Lasix.  She reports her swelling is improved but not gone.  She is not currently taking the Lasix.  2. Myalgia The patient had myalgias while on Zocor.  She states that the symptoms improved off of the medication.  It is time to have her labs checked as she is not on anything currently for her cholesterol.  3. Other allergic rhinitis Patient was started on Montelukast in September for her allergies.  She reports that she still has a cough and can not really tell that she is any better on that then she was on the Loratadine.   Patient Active Problem List   Diagnosis Date Noted  . Absolute anemia 07/30/2015  . BP (high blood pressure) 07/30/2015  . Edema extremities 07/30/2015  . Benign essential tremor 07/30/2015  . Anxiety, generalized 07/30/2015  . Gastro-esophageal reflux disease without esophagitis 07/30/2015  . HLD (hyperlipidemia) 07/30/2015  . Cannot sleep 07/30/2015  . Lumbar radiculopathy 07/30/2015  . Depression, major, recurrent, in partial remission ([place]) 07/30/2015  . Arthritis, degenerative 07/30/2015  . Allergic rhinitis 07/30/2015  . Gastroduodenal ulcer 07/30/2015  . Calcium blood increased 07/30/2015  . Pre-syncope 05/17/2015  . Dyspnea on exertion 03/31/2015  . Essential hypertension 03/31/2015  . Bleeding stomach ulcer 10/14/2013  . Arthritis 10/14/2013  . Anemia, iron deficiency 10/14/2013  . Depression 10/14/2013  . Weakness generalized 10/14/2013  . Ejection fraction   . Pericardial effusion   . PONV (postoperative nausea and vomiting)   . Hyperlipidemia   . Palpitations   . Low back pain   . Normal nuclear  stress test     Past Medical History  Diagnosis Date  . PONV (postoperative nausea and vomiting)   . Hypertension     a. takes Metoprolol, Lisinopril, and amlodipine.  . Hyperlipidemia     a. takes Simvastatin  . Palpitations     a. June 2013:  Event recorder normal sinus rhythm with rare PVCs - ? symptomatic PVC's.  . Low back pain   . Normal nuclear stress test     a. 07/2005 Nl nuc stress test.  . Ejection fraction     a. EF 55-65%, echo, mild LVH, June, 2013, small pericardial effusion  . Pericardial effusion     a. Small, echo, June, 2013    Social History   Social History  . Marital Status: Divorced    Spouse Name: N/A  . Number of Children: N/A  . Years of Education: N/A   Occupational History  . Not on file.   Social History Main Topics  . Smoking status: Never Smoker   . Smokeless tobacco: Never Used  . Alcohol Use: 0.0 oz/week    0 Glasses of wine per week     Comment: drinks 3-4 times a year  . Drug Use: No  . Sexual Activity: No   Other Topics Concern  . Not on file   Social History Narrative    Outpatient Prescriptions Prior to Visit  Medication Sig Dispense Refill  . BIOTIN PO Take 5,000 mcg by mouth daily.     . citalopram (CELEXA) 40  MG tablet     . diazepam (VALIUM) 5 MG tablet Take 5 mg by mouth 4 (four) times daily as needed.    . fish oil-omega-3 fatty acids 1000 MG capsule Take 1 g by mouth daily.      Marland Kitchen lisinopril (PRINIVIL,ZESTRIL) 40 MG tablet Take 40 mg by mouth daily.    . metoprolol tartrate (LOPRESSOR) 25 MG tablet Take 25 mg by mouth 2 (two) times daily.    . montelukast (SINGULAIR) 10 MG tablet Take 1 tablet (10 mg total) by mouth at bedtime. 30 tablet 12  . TRAZODONE HCL PO Take 250 mg by mouth at bedtime.    Marland Kitchen loratadine (CLARITIN) 10 MG tablet      No facility-administered medications prior to visit.    Allergies  Allergen Reactions  . Anti-Inflammatory Enzyme [Nutritional Supplements] Other (See Comments)    REACTION:  Due to bleeding ulcer  . Cymbalta [Duloxetine Hcl] Other (See Comments)    REACTION: Urinary retention  . Clarithromycin Other (See Comments)    Upsets stomach  . Clarithromycin     GI upset  . Nsaids     GI BLEED  . Ranitidine     severe diarrhea  . Venlafaxine     severe depression    Review of Systems  Constitutional: Positive for malaise/fatigue. Negative for fever, chills, weight loss and diaphoresis.  Eyes: Negative.   Respiratory: Positive for cough, sputum production (MOre from drainage than chest congestion), shortness of breath (chronic but unchanged) and wheezing (chronic and unchanged). Negative for hemoptysis.   Cardiovascular: Positive for leg swelling. Negative for chest pain, palpitations and orthopnea.  Neurological: Negative for dizziness, weakness and headaches.  Psychiatric/Behavioral: Negative.    Objective:  BP 142/76 mmHg  Pulse 60  Temp(Src) 98 F (36.7 C) (Oral)  Resp 16  Wt 195 lb (88.451 kg)  Physical Exam  Constitutional: She is oriented to person, place, and time and well-developed, well-nourished, and in no distress.  HENT:  Head: Normocephalic and atraumatic.  Right Ear: External ear normal.  Left Ear: External ear normal.  Nose: Nose normal.  Neck: Normal range of motion.  Cardiovascular: Normal rate, regular rhythm and normal heart sounds.   Pulmonary/Chest: Effort normal and breath sounds normal.  Abdominal: Soft.  Musculoskeletal: She exhibits edema (mild edema of the posterior of the medial malleolus ).  Neurological: She is alert and oriented to person, place, and time.  Skin: Skin is warm and dry.  Psychiatric: Mood, memory, affect and judgment normal.    Assessment and Plan :  1. Edema extremities  - COMPLETE METABOLIC PANEL WITH GFR  2. Myalgia  - CK - COMPLETE METABOLIC PANEL WITH GFR  3. Other allergic rhinitis   4. Hyperlipidemia  - Lipid Panel With LDL/HDL Ratio  5. Acute anxiety  - diazepam (VALIUM) 5 MG  tablet; Take 1 tablet (5 mg total) by mouth 4 (four) times daily as needed.  Dispense: 75 tablet; Refill: 3 6. Peptic ulcer disease Patient had a life-threatening upper GI bleed so will never used nonsteroidals.   Miguel Aschoff MD [facility] 02/01/2016 1:54 PM

## 2016-02-04 DIAGNOSIS — N3941 Urge incontinence: Secondary | ICD-10-CM | POA: Diagnosis not present

## 2016-02-09 ENCOUNTER — Other Ambulatory Visit: Payer: Self-pay | Admitting: Family Medicine

## 2016-02-10 DIAGNOSIS — F331 Major depressive disorder, recurrent, moderate: Secondary | ICD-10-CM | POA: Diagnosis not present

## 2016-02-10 DIAGNOSIS — F5105 Insomnia due to other mental disorder: Secondary | ICD-10-CM | POA: Diagnosis not present

## 2016-02-15 ENCOUNTER — Ambulatory Visit
Admission: RE | Admit: 2016-02-15 | Discharge: 2016-02-15 | Disposition: A | Discharge status: Home / Self Care | Payer: Medicare Other | Source: Ambulatory Visit

## 2016-02-15 DIAGNOSIS — Z1231 Encounter for screening mammogram for malignant neoplasm of breast: Secondary | ICD-10-CM

## 2016-03-08 ENCOUNTER — Other Ambulatory Visit: Payer: Self-pay

## 2016-03-08 DIAGNOSIS — F419 Anxiety disorder, unspecified: Secondary | ICD-10-CM

## 2016-03-08 MED ORDER — DIAZEPAM 5 MG PO TABS: ORAL_TABLET | ORAL | Status: DC

## 2016-03-08 NOTE — Telephone Encounter (Signed)
Received fax in regards to Diazepam for the patient-quantity limit is requested, looks like we have been giving patient 75 tablets and she has been using [employer], spoke with patient she takes maybe 5 tablets a month or so mainly for tremors in her hands and RLS symptoms she gets. Per Dr. Rosanna Randy and patient is ok with this will change RX to 1/2 to 1 tablet daily as needed 90 tablets for 90 day supply and see if insurance will cover this. Did not call for quantity exception yet. -aa

## 2016-03-18 DIAGNOSIS — N3941 Urge incontinence: Secondary | ICD-10-CM | POA: Diagnosis not present

## 2016-03-28 DIAGNOSIS — M170 Bilateral primary osteoarthritis of knee: Secondary | ICD-10-CM | POA: Diagnosis not present

## 2016-03-31 DIAGNOSIS — Z0000 Encounter for general adult medical examination without abnormal findings: Secondary | ICD-10-CM | POA: Diagnosis not present

## 2016-03-31 DIAGNOSIS — N3946 Mixed incontinence: Secondary | ICD-10-CM | POA: Diagnosis not present

## 2016-03-31 DIAGNOSIS — R351 Nocturia: Secondary | ICD-10-CM | POA: Diagnosis not present

## 2016-04-05 DIAGNOSIS — N3941 Urge incontinence: Secondary | ICD-10-CM | POA: Diagnosis not present

## 2016-04-08 DIAGNOSIS — R2689 Other abnormalities of gait and mobility: Secondary | ICD-10-CM | POA: Diagnosis not present

## 2016-04-08 DIAGNOSIS — M25561 Pain in right knee: Secondary | ICD-10-CM | POA: Diagnosis not present

## 2016-04-08 DIAGNOSIS — M25562 Pain in left knee: Secondary | ICD-10-CM | POA: Diagnosis not present

## 2016-04-08 DIAGNOSIS — M25661 Stiffness of right knee, not elsewhere classified: Secondary | ICD-10-CM | POA: Diagnosis not present

## 2016-04-08 DIAGNOSIS — M25662 Stiffness of left knee, not elsewhere classified: Secondary | ICD-10-CM | POA: Diagnosis not present

## 2016-04-12 DIAGNOSIS — M25562 Pain in left knee: Secondary | ICD-10-CM | POA: Diagnosis not present

## 2016-04-12 DIAGNOSIS — M25661 Stiffness of right knee, not elsewhere classified: Secondary | ICD-10-CM | POA: Diagnosis not present

## 2016-04-12 DIAGNOSIS — M25561 Pain in right knee: Secondary | ICD-10-CM | POA: Diagnosis not present

## 2016-04-12 DIAGNOSIS — M25662 Stiffness of left knee, not elsewhere classified: Secondary | ICD-10-CM | POA: Diagnosis not present

## 2016-04-15 DIAGNOSIS — M25662 Stiffness of left knee, not elsewhere classified: Secondary | ICD-10-CM | POA: Diagnosis not present

## 2016-04-15 DIAGNOSIS — M25561 Pain in right knee: Secondary | ICD-10-CM | POA: Diagnosis not present

## 2016-04-15 DIAGNOSIS — M25661 Stiffness of right knee, not elsewhere classified: Secondary | ICD-10-CM | POA: Diagnosis not present

## 2016-04-15 DIAGNOSIS — M25562 Pain in left knee: Secondary | ICD-10-CM | POA: Diagnosis not present

## 2016-04-19 DIAGNOSIS — M25662 Stiffness of left knee, not elsewhere classified: Secondary | ICD-10-CM | POA: Diagnosis not present

## 2016-04-19 DIAGNOSIS — M25661 Stiffness of right knee, not elsewhere classified: Secondary | ICD-10-CM | POA: Diagnosis not present

## 2016-04-19 DIAGNOSIS — M25562 Pain in left knee: Secondary | ICD-10-CM | POA: Diagnosis not present

## 2016-04-19 DIAGNOSIS — M25561 Pain in right knee: Secondary | ICD-10-CM | POA: Diagnosis not present

## 2016-04-22 ENCOUNTER — Encounter: Payer: Self-pay | Admitting: Family Medicine

## 2016-04-22 ENCOUNTER — Ambulatory Visit (INDEPENDENT_AMBULATORY_CARE_PROVIDER_SITE_OTHER): Payer: Medicare Other | Admitting: Family Medicine

## 2016-04-22 VITALS — BP 104/64 | HR 64 | Temp 98.1°F | Resp 16 | Wt 191.0 lb

## 2016-04-22 DIAGNOSIS — I10 Essential (primary) hypertension: Secondary | ICD-10-CM

## 2016-04-22 DIAGNOSIS — E785 Hyperlipidemia, unspecified: Secondary | ICD-10-CM | POA: Diagnosis not present

## 2016-04-22 DIAGNOSIS — H01006 Unspecified blepharitis left eye, unspecified eyelid: Secondary | ICD-10-CM | POA: Diagnosis not present

## 2016-04-22 DIAGNOSIS — M791 Myalgia: Secondary | ICD-10-CM | POA: Diagnosis not present

## 2016-04-22 MED ORDER — ERYTHROMYCIN 5 MG/GM OP OINT: 1.0000 "application " | TOPICAL_OINTMENT | Freq: Three times a day (TID) | OPHTHALMIC | Status: DC

## 2016-04-22 NOTE — Progress Notes (Signed)
Patient ID: Nancy Blackburn, female   DOB: 17-Jan-1946, 70 y.o.   MRN: [id number]       [name] Female    DOB: 09/25/1946   70 y.o.   MRN: [id number] Visit Date: 04/22/2016  Today's Provider: Vernie Murders, PA   Chief Complaint  Patient presents with  . Eye Pain   Subjective:    Eye Pain  The left eye is affected. This is a new problem. The current episode started yesterday. The problem has been gradually worsening. The pain is moderate. There is no known exposure to pink eye. Associated symptoms include blurred vision, an eye discharge, eye redness and itching. Pertinent negatives include no fever. Treatments tried: warm compresses. The treatment provided no relief.    Past Medical History  Diagnosis Date  . PONV (postoperative nausea and vomiting)   . Hypertension     a. takes Metoprolol, Lisinopril, and amlodipine.  . Hyperlipidemia     a. takes Simvastatin  . Palpitations     a. June 2013:  Event recorder normal sinus rhythm with rare PVCs - ? symptomatic PVC's.  . Low back pain   . Normal nuclear stress test     a. 07/2005 Nl nuc stress test.  . Ejection fraction     a. EF 55-65%, echo, mild LVH, June, 2013, small pericardial effusion  . Pericardial effusion     a. Small, echo, June, 2013   Past Surgical History  Procedure Laterality Date  . Tonsillectomy      as child  . Tubal ligation      17yrs ago  . Thyroidectomy, partial    . Vaginal hysterectomy      58yrs ago d/t cervical cancer  . Bladder suspension  2003  . Bone spur  2009    removed from right collar bone area and partial collar bone removed  . Back surgery  08/2010   Family History  Problem Relation Age of Onset  . Colon cancer Mother   . Heart murmur Mother   . Heart disease Mother   . Angina Mother   . Hypotension Neg Hx   . Malignant hyperthermia Neg Hx   . Pseudochol deficiency Neg Hx   . Heart disease Father   . COPD Father   . Atrial fibrillation Brother     Allergies  Allergen Reactions  . Anti-Inflammatory Enzyme [Nutritional Supplements] Other (See Comments)    REACTION: Due to bleeding ulcer  . Cymbalta [Duloxetine Hcl] Other (See Comments)    REACTION: Urinary retention  . Clarithromycin Other (See Comments)    Upsets stomach  . Clarithromycin     GI upset  . Nsaids     GI BLEED  . Ranitidine     severe diarrhea  . Venlafaxine     severe depression   Previous Medications   BIOTIN PO    Take 5,000 mcg by mouth daily.    CITALOPRAM (CELEXA) 40 MG TABLET       DIAZEPAM (VALIUM) 5 MG TABLET    Take 1/2 to 1 tablet daily as needed.   FISH OIL-OMEGA-3 FATTY ACIDS 1000 MG CAPSULE    Take 1 g by mouth daily.     LISINOPRIL (PRINIVIL,ZESTRIL) 40 MG TABLET    Take 40 mg by mouth daily.   METOPROLOL TARTRATE (LOPRESSOR) 25 MG TABLET    TAKE 1 TABLET TWICE DAILY   MONTELUKAST (SINGULAIR) 10 MG TABLET    Take 1 tablet (10 mg total) by mouth  at bedtime.   TOLTERODINE (DETROL LA) 4 MG 24 HR CAPSULE    Take 4 mg by mouth daily.   TRAZODONE HCL PO    Take 250 mg by mouth at bedtime.   TRIMETHOPRIM (TRIMPEX) 100 MG TABLET    Take 100 mg by mouth 2 (two) times daily.    Review of Systems  Constitutional: Negative for fever.  Eyes: Positive for blurred vision, pain, discharge and redness.  Skin: Positive for itching.    Social History  Substance Use Topics  . Smoking status: Never Smoker   . Smokeless tobacco: Never Used  . Alcohol Use: 0.0 oz/week    0 Glasses of wine per week     Comment: drinks 3-4 times a year   Objective:   Wt 191 lb (86.637 kg)  Physical Exam  Constitutional: She is oriented to person, place, and time. She appears well-developed and well-nourished. No distress.  HENT:  Head: Normocephalic and atraumatic.  Right Ear: Hearing normal.  Left Ear: Hearing normal.  Nose: Nose normal.  Eyes: Conjunctivae and lids are normal. Right eye exhibits no discharge. Left eye exhibits no discharge. No scleral icterus.   Red and tender left upper eyelid. No scleral or conjunctival irritations.  Pulmonary/Chest: Effort normal. No respiratory distress.  Musculoskeletal: Normal range of motion.  Neurological: She is alert and oriented to person, place, and time.  Skin: Skin is intact. No lesion and no rash noted.  Psychiatric: She has a normal mood and affect. Her speech is normal and behavior is normal. Thought content normal.      Assessment & Plan:     1. Blepharitis, left Red and tender left upper eyelid with suspected early chalazion started over the past few days. No conjunctival irritation. Will treat with Erythromycin ointment and use hot compresses prn. Recheck if no better in 4 days. - erythromycin ophthalmic ointment; Place 1 application into the left eye 3 (three) times daily.  Dispense: 3.5 g; Refill: 0       Vernie Murders, PA  [facility]

## 2016-04-22 NOTE — Patient Instructions (Signed)
Blepharitis Blepharitis is inflammation of the eyelids. Blepharitis may happen with:  Reddish, scaly skin around the scalp and eyebrows.  Burning or itching of the eyelids.  Eye discharge at night that causes the eyelashes to stick together in the morning.  Eyelashes that fall out.  Sensitivity to light. HOME CARE INSTRUCTIONS Pay attention to any changes in how you look or feel. Follow these instructions to help with your condition: Keeping Clean  Wash your hands often.  Wash your eyelids with warm water or with warm water that is mixed with a small amount of baby shampoo. Do this two times per day or as often as needed.  Wash your face and eyebrows at least once a day.  Use a clean towel each time you dry your eyelids. Do not use this towel to clean or dry other areas of your body. Do not share your towel with anyone. General Instructions  Avoid wearing makeup until you get better. Do not share makeup with anyone.  Avoid rubbing your eyes.  Apply warm compresses to your eyes 2 times per day for 10 minutes at a time, or as told by your health care provider.  If you were prescribed an antibiotic ointment or steroid drops, apply or use the medicine as told by your health care provider. Do not stop using the medicine even if you feel better.  Keep all follow-up visits as told by your health care provider. This is important. SEEK MEDICAL CARE IF:  Your eyelids feel hot.  You have blisters or a rash on your eyelids.  The condition does not go away in 2-4 days.  The inflammation gets worse. SEEK IMMEDIATE MEDICAL CARE IF:  You have pain or redness that gets worse or spreads to other parts of your face.  Your vision changes.  You have pain when looking at lights or moving objects.  You have a fever.   This information is not intended to replace advice given to you by your health care provider. Make sure you discuss any questions you have with your health care  provider.   Document Released: 11/25/2000 Document Revised: 08/19/2015 Document Reviewed: 03/23/2015 Elsevier Interactive Patient Education 2016 [employer]  

## 2016-04-23 LAB — COMPREHENSIVE METABOLIC PANEL
ALT: 12 IU/L (ref 0–32)
AST: 12 IU/L (ref 0–40)
Albumin/Globulin Ratio: 1.8 (ref 1.2–2.2)
Albumin: 3.9 g/dL (ref 3.5–4.8)
Alkaline Phosphatase: 83 IU/L (ref 39–117)
BUN/Creatinine Ratio: 16 (ref 12–28)
BUN: 21 mg/dL (ref 8–27)
Bilirubin Total: 0.4 mg/dL (ref 0.0–1.2)
CO2: 26 mmol/L (ref 18–29)
Calcium: 9.8 mg/dL (ref 8.7–10.3)
Chloride: 102 mmol/L (ref 96–106)
Creatinine, Ser: 1.33 mg/dL — ABNORMAL HIGH (ref 0.57–1.00)
GFR calc Af Amer: 47 mL/min/{1.73_m2} — ABNORMAL LOW (ref >59)
GFR calc non Af Amer: 41 mL/min/{1.73_m2} — ABNORMAL LOW (ref >59)
Globulin, Total: 2.2 g/dL (ref 1.5–4.5)
Glucose: 104 mg/dL — ABNORMAL HIGH (ref 65–99)
Potassium: 5.2 mmol/L (ref 3.5–5.2)
Sodium: 141 mmol/L (ref 134–144)
Total Protein: 6.1 g/dL (ref 6.0–8.5)

## 2016-04-23 LAB — CK: Total CK: 35 U/L (ref 24–173)

## 2016-04-23 LAB — LIPID PANEL WITH LDL/HDL RATIO
Cholesterol, Total: 275 mg/dL — ABNORMAL HIGH (ref 100–199)
HDL: 49 mg/dL (ref >39)
LDL Calculated: 192 mg/dL — ABNORMAL HIGH (ref 0–99)
LDl/HDL Ratio: 3.9 ratio units — ABNORMAL HIGH (ref 0.0–3.2)
Triglycerides: 168 mg/dL — ABNORMAL HIGH (ref 0–149)
VLDL Cholesterol Cal: 34 mg/dL (ref 5–40)

## 2016-04-25 DIAGNOSIS — M25662 Stiffness of left knee, not elsewhere classified: Secondary | ICD-10-CM | POA: Diagnosis not present

## 2016-04-25 DIAGNOSIS — M25562 Pain in left knee: Secondary | ICD-10-CM | POA: Diagnosis not present

## 2016-04-25 DIAGNOSIS — M25661 Stiffness of right knee, not elsewhere classified: Secondary | ICD-10-CM | POA: Diagnosis not present

## 2016-04-25 DIAGNOSIS — M25561 Pain in right knee: Secondary | ICD-10-CM | POA: Diagnosis not present

## 2016-04-25 MED ORDER — ROSUVASTATIN CALCIUM 10 MG PO TABS: 10.0000 mg | ORAL_TABLET | Freq: Every day | ORAL | Status: DC

## 2016-04-25 NOTE — Addendum Note (Signed)
Addended by: Arnette Norris on: 04/25/2016 12:07 PM   Modules accepted: Orders

## 2016-04-27 DIAGNOSIS — M25561 Pain in right knee: Secondary | ICD-10-CM | POA: Diagnosis not present

## 2016-04-27 DIAGNOSIS — M25562 Pain in left knee: Secondary | ICD-10-CM | POA: Diagnosis not present

## 2016-04-27 DIAGNOSIS — M25662 Stiffness of left knee, not elsewhere classified: Secondary | ICD-10-CM | POA: Diagnosis not present

## 2016-04-27 DIAGNOSIS — M25661 Stiffness of right knee, not elsewhere classified: Secondary | ICD-10-CM | POA: Diagnosis not present

## 2016-04-28 DIAGNOSIS — N3941 Urge incontinence: Secondary | ICD-10-CM | POA: Diagnosis not present

## 2016-04-29 DIAGNOSIS — H0014 Chalazion left upper eyelid: Secondary | ICD-10-CM | POA: Diagnosis not present

## 2016-05-20 DIAGNOSIS — H0014 Chalazion left upper eyelid: Secondary | ICD-10-CM | POA: Diagnosis not present

## 2016-05-26 ENCOUNTER — Ambulatory Visit (INDEPENDENT_AMBULATORY_CARE_PROVIDER_SITE_OTHER): Payer: Medicare Other | Admitting: Family Medicine

## 2016-05-26 ENCOUNTER — Ambulatory Visit
Admission: RE | Admit: 2016-05-26 | Discharge: 2016-05-26 | Disposition: A | Discharge status: Home / Self Care | Payer: Medicare Other | Source: Ambulatory Visit | Attending: Family Medicine | Admitting: Family Medicine

## 2016-05-26 VITALS — BP 84/52 | HR 68 | Temp 98.0°F | Resp 16 | Wt 191.0 lb

## 2016-05-26 DIAGNOSIS — R0601 Orthopnea: Secondary | ICD-10-CM

## 2016-05-26 DIAGNOSIS — F329 Major depressive disorder, single episode, unspecified: Secondary | ICD-10-CM | POA: Diagnosis not present

## 2016-05-26 DIAGNOSIS — R0600 Dyspnea, unspecified: Secondary | ICD-10-CM

## 2016-05-26 DIAGNOSIS — R5383 Other fatigue: Secondary | ICD-10-CM | POA: Diagnosis not present

## 2016-05-26 DIAGNOSIS — I10 Essential (primary) hypertension: Secondary | ICD-10-CM

## 2016-05-26 DIAGNOSIS — F419 Anxiety disorder, unspecified: Secondary | ICD-10-CM | POA: Diagnosis not present

## 2016-05-26 DIAGNOSIS — R05 Cough: Secondary | ICD-10-CM | POA: Diagnosis not present

## 2016-05-26 DIAGNOSIS — E785 Hyperlipidemia, unspecified: Secondary | ICD-10-CM

## 2016-05-26 DIAGNOSIS — J9811 Atelectasis: Secondary | ICD-10-CM | POA: Insufficient documentation

## 2016-05-26 DIAGNOSIS — N3946 Mixed incontinence: Secondary | ICD-10-CM | POA: Diagnosis not present

## 2016-05-26 DIAGNOSIS — F32A Depression, unspecified: Secondary | ICD-10-CM

## 2016-05-26 DIAGNOSIS — R739 Hyperglycemia, unspecified: Secondary | ICD-10-CM

## 2016-05-26 NOTE — Progress Notes (Signed)
Patient ID: Nancy Blackburn, female   DOB: 09/15/1946, 70 y.o.   MRN: [id number]    Subjective:  HPI  Patient is here to discuss shortness of breath. This has been an issue for at least 1 year. She did see cardiologist and had cardiac work up and everything was fine. She does not want to do those tests again and wonders if something is wrong with lungs. Dyspnea is worsening, she has hard time vacuuming, going to her mailbox, walking from parking lot up the stairs. She gives out fast and sometimes with not doing much at all. She is fatigue.  Prior to Admission medications   Medication Sig Start Date End Date Taking? Authorizing Provider  BIOTIN PO Take 5,000 mcg by mouth daily.    Yes Historical Provider, MD  citalopram (CELEXA) 40 MG tablet  08/06/15  Yes Historical Provider, MD  diazepam (VALIUM) 5 MG tablet Take 1/2 to 1 tablet daily as needed. 03/08/16  Yes Richard Maceo Pro., MD  fish oil-omega-3 fatty acids 1000 MG capsule Take 1 g by mouth daily.     Yes Historical Provider, MD  lisinopril (PRINIVIL,ZESTRIL) 40 MG tablet Take 40 mg by mouth daily. 01/30/15  Yes Historical Provider, MD  metoprolol tartrate (LOPRESSOR) 25 MG tablet TAKE 1 TABLET TWICE DAILY 02/09/16  Yes Richard Maceo Pro., MD  montelukast (SINGULAIR) 10 MG tablet Take 1 tablet (10 mg total) by mouth at bedtime. 08/31/15  Yes Richard Maceo Pro., MD  rosuvastatin (CRESTOR) 10 MG tablet Take 1 tablet (10 mg total) by mouth daily. 04/25/16  Yes Richard Maceo Pro., MD  tolterodine (DETROL LA) 4 MG 24 hr capsule Take 4 mg by mouth daily.   Yes Historical Provider, MD  TRAZODONE HCL PO Take 250 mg by mouth at bedtime.   Yes Historical Provider, MD  trimethoprim (TRIMPEX) 100 MG tablet Take 100 mg by mouth 2 (two) times daily.   Yes Historical Provider, MD    Patient Active Problem List   Diagnosis Date Noted  . Absolute anemia 07/30/2015  . BP (high blood pressure) 07/30/2015  . Edema extremities 07/30/2015  .  Benign essential tremor 07/30/2015  . Anxiety, generalized 07/30/2015  . Gastro-esophageal reflux disease without esophagitis 07/30/2015  . HLD (hyperlipidemia) 07/30/2015  . Cannot sleep 07/30/2015  . Lumbar radiculopathy 07/30/2015  . Depression, major, recurrent, in partial remission ([place]) 07/30/2015  . Arthritis, degenerative 07/30/2015  . Allergic rhinitis 07/30/2015  . Gastroduodenal ulcer 07/30/2015  . Calcium blood increased 07/30/2015  . Pre-syncope 05/17/2015  . Dyspnea on exertion 03/31/2015  . Essential hypertension 03/31/2015  . Bleeding stomach ulcer 10/14/2013  . Arthritis 10/14/2013  . Anemia, iron deficiency 10/14/2013  . Depression 10/14/2013  . Weakness generalized 10/14/2013  . Ejection fraction   . Pericardial effusion   . PONV (postoperative nausea and vomiting)   . Hyperlipidemia   . Palpitations   . Low back pain   . Normal nuclear stress test     Past Medical History  Diagnosis Date  . PONV (postoperative nausea and vomiting)   . Hypertension     a. takes Metoprolol, Lisinopril, and amlodipine.  . Hyperlipidemia     a. takes Simvastatin  . Palpitations     a. June 2013:  Event recorder normal sinus rhythm with rare PVCs - ? symptomatic PVC's.  . Low back pain   . Normal nuclear stress test     a. 07/2005 Nl nuc stress test.  . Ejection  fraction     a. EF 55-65%, echo, mild LVH, June, 2013, small pericardial effusion  . Pericardial effusion     a. Small, echo, June, 2013    Social History   Social History  . Marital Status: Divorced    Spouse Name: N/A  . Number of Children: N/A  . Years of Education: N/A   Occupational History  . Not on file.   Social History Main Topics  . Smoking status: Never Smoker   . Smokeless tobacco: Never Used  . Alcohol Use: 0.0 oz/week    0 Glasses of wine per week     Comment: drinks 3-4 times a year  . Drug Use: No  . Sexual Activity: No   Other Topics Concern  . Not on file   Social History  Narrative    Allergies  Allergen Reactions  . Anti-Inflammatory Enzyme [Nutritional Supplements] Other (See Comments)    REACTION: Due to bleeding ulcer  . Cymbalta [Duloxetine Hcl] Other (See Comments)    REACTION: Urinary retention  . Clarithromycin Other (See Comments)    Upsets stomach  . Clarithromycin     GI upset  . Nsaids     GI BLEED  . Ranitidine     severe diarrhea  . Venlafaxine     severe depression    Review of Systems  Constitutional: Positive for malaise/fatigue.  HENT: Negative.   Eyes: Negative for blurred vision.  Respiratory: Positive for shortness of breath. Negative for cough and hemoptysis.   Cardiovascular: Negative.   Gastrointestinal: Negative.   Genitourinary: Negative.   Musculoskeletal: Positive for back pain and joint pain.  Skin: Negative.   Neurological: Negative.   Endo/Heme/Allergies: Negative.   Psychiatric/Behavioral: Positive for depression. The patient is nervous/anxious.     Immunization History  Administered Date(s) Administered  . Influenza, High Dose Seasonal PF 09/01/2015  . Influenza-Unspecified 10/12/2013, 08/12/2014   Objective:  BP 84/52 mmHg  Pulse 68  Temp(Src) 98 F (36.7 C)  Resp 16  Wt 191 lb (86.637 kg)  SpO2 97%  Physical Exam  Constitutional: She is oriented to person, place, and time and well-developed, well-nourished, and in no distress.  HENT:  Head: Normocephalic and atraumatic.  Right Ear: External ear normal.  Left Ear: External ear normal.  Nose: Nose normal.  Eyes: Conjunctivae are normal. Pupils are equal, round, and reactive to light.  Neck: Normal range of motion. Neck supple.  Cardiovascular: Normal rate, regular rhythm, normal heart sounds and intact distal pulses.   No murmur heard. Pulmonary/Chest: Effort normal and breath sounds normal. No respiratory distress. She has no wheezes.  Abdominal: Soft. Bowel sounds are normal.  Musculoskeletal: She exhibits edema (trace). She exhibits no  tenderness.  Neurological: She is alert and oriented to person, place, and time.  Skin: Skin is warm and dry.  Psychiatric: Mood, memory, affect and judgment normal.    Lab Results  Component Value Date   WBC 8.5 07/30/2015   HGB 14.7 10/28/2014   HCT 40.7 07/30/2015   PLT 268 07/30/2015   GLUCOSE 104* 04/22/2016   CHOL 275* 04/22/2016   TRIG 168* 04/22/2016   HDL 49 04/22/2016   LDLCALC 192* 04/22/2016   TSH 0.783 07/30/2015   INR 0.94 09/02/2010    CMP     Component Value Date/Time   NA 141 04/22/2016 1128   NA 136 10/28/2014 1755   NA 140 11/25/2011 0608   K 5.2 04/22/2016 1128   K 4.1 10/28/2014 1755  CL 102 04/22/2016 1128   CL 102 10/28/2014 1755   CO2 26 04/22/2016 1128   CO2 34* 10/28/2014 1755   GLUCOSE 104* 04/22/2016 1128   GLUCOSE 107* 10/28/2014 1755   GLUCOSE 125* 11/25/2011 0608   BUN 21 04/22/2016 1128   BUN 16 10/28/2014 1755   BUN 7 11/25/2011 0608   CREATININE 1.33* 04/22/2016 1128   CREATININE 1.28 10/28/2014 1755   CALCIUM 9.8 04/22/2016 1128   CALCIUM 9.1 10/28/2014 1755   PROT 6.1 04/22/2016 1128   PROT 4.8* 10/05/2013 0553   PROT 6.1 09/02/2010 1129   ALBUMIN 3.9 04/22/2016 1128   ALBUMIN 3.6 10/28/2014 1755   ALBUMIN 3.7 09/02/2010 1129   AST 12 04/22/2016 1128   AST 13* 10/05/2013 0553   ALT 12 04/22/2016 1128   ALT 8* 10/05/2013 0553   ALKPHOS 83 04/22/2016 1128   ALKPHOS 73 10/05/2013 0553   BILITOT 0.4 04/22/2016 1128   BILITOT 0.2 10/05/2013 0553   BILITOT 0.4 09/02/2010 1129   GFRNONAA 41* 04/22/2016 1128   GFRNONAA 44* 10/28/2014 1755   GFRNONAA >60 10/06/2013 0518   GFRAA 47* 04/22/2016 1128   GFRAA 53* 10/28/2014 1755   GFRAA >60 10/06/2013 0518    Assessment and Plan :  1. Dyspnea Worsening. EKG and Spirometry stable. Do not think this is cardiac. Will refer to pulmonologist. - EKG 12-Lead - Spirometry with graph - Sed Rate (ESR) She had full cardiac workup in 2015 and 16 for the symptoms. Please see note from  Dr. Ron Parker. Symptoms have not changed since then other than being a little worse along with chronicity. 2. Other fatigue Re check in 1 week. - CBC with Differential/Platelet - Lipid Panel With LDL/HDL Ratio - Sed Rate (ESR)  3. Essential hypertension Hypotensive today. Will stop Lisinopril and re check in 1 week.  4. Depression Symptoms patient is experiencing maybe coming from how she feels emotionally. This might be worse because of depression but I do not believe that depression is the etiology of the symptoms.  5. Acute anxiety - TSH  6. Hyperlipidemia Will check levels on Crestor.  7. Hyperglycemia History of elevated sugar.  - Lipid Panel With LDL/HDL Ratio - HgB A1c 8. Anemia secondary to GI bleed Check CBC for resolution. No evidence of further GI bleeding. No NSAID use. Patient was seen and examined by Dr. Eulas Post and note was scribed by Theressa Millard, RMA.   Miguel Aschoff MD [facility] 05/26/2016 3:15 PM

## 2016-05-27 ENCOUNTER — Encounter: Payer: Self-pay | Admitting: Pulmonary Disease

## 2016-05-27 DIAGNOSIS — R739 Hyperglycemia, unspecified: Secondary | ICD-10-CM | POA: Diagnosis not present

## 2016-05-27 DIAGNOSIS — R0601 Orthopnea: Secondary | ICD-10-CM | POA: Diagnosis not present

## 2016-05-27 DIAGNOSIS — R5383 Other fatigue: Secondary | ICD-10-CM | POA: Diagnosis not present

## 2016-05-27 DIAGNOSIS — R0600 Dyspnea, unspecified: Secondary | ICD-10-CM | POA: Diagnosis not present

## 2016-05-27 DIAGNOSIS — F419 Anxiety disorder, unspecified: Secondary | ICD-10-CM | POA: Diagnosis not present

## 2016-05-28 LAB — TSH: TSH: 0.92 u[IU]/mL (ref 0.450–4.500)

## 2016-05-28 LAB — HEMOGLOBIN A1C
Est. average glucose Bld gHb Est-mCnc: 114 mg/dL
Hgb A1c MFr Bld: 5.6 % (ref 4.8–5.6)

## 2016-05-28 LAB — CBC WITH DIFFERENTIAL/PLATELET
Basophils Absolute: 0.1 10*3/uL (ref 0.0–0.2)
Basos: 1 %
EOS (ABSOLUTE): 0.4 10*3/uL (ref 0.0–0.4)
Eos: 5 %
Hematocrit: 39.1 % (ref 34.0–46.6)
Hemoglobin: 13.1 g/dL (ref 11.1–15.9)
Immature Grans (Abs): 0 10*3/uL (ref 0.0–0.1)
Immature Granulocytes: 0 %
Lymphocytes Absolute: 2.7 10*3/uL (ref 0.7–3.1)
Lymphs: 34 %
MCH: 29.8 pg (ref 26.6–33.0)
MCHC: 33.5 g/dL (ref 31.5–35.7)
MCV: 89 fL (ref 79–97)
Monocytes Absolute: 1 10*3/uL — ABNORMAL HIGH (ref 0.1–0.9)
Monocytes: 13 %
Neutrophils Absolute: 3.7 10*3/uL (ref 1.4–7.0)
Neutrophils: 47 %
Platelets: 264 10*3/uL (ref 150–379)
RBC: 4.39 x10E6/uL (ref 3.77–5.28)
RDW: 13.2 % (ref 12.3–15.4)
WBC: 7.8 10*3/uL (ref 3.4–10.8)

## 2016-05-28 LAB — SEDIMENTATION RATE: Sed Rate: 9 mm/hr (ref 0–40)

## 2016-05-28 LAB — LIPID PANEL WITH LDL/HDL RATIO
Cholesterol, Total: 189 mg/dL (ref 100–199)
HDL: 47 mg/dL (ref >39)
LDL Calculated: 104 mg/dL — ABNORMAL HIGH (ref 0–99)
LDl/HDL Ratio: 2.2 ratio units (ref 0.0–3.2)
Triglycerides: 188 mg/dL — ABNORMAL HIGH (ref 0–149)
VLDL Cholesterol Cal: 38 mg/dL (ref 5–40)

## 2016-05-28 LAB — BRAIN NATRIURETIC PEPTIDE: BNP: 58.9 pg/mL (ref 0.0–100.0)

## 2016-06-01 DIAGNOSIS — N3946 Mixed incontinence: Secondary | ICD-10-CM | POA: Diagnosis not present

## 2016-06-01 DIAGNOSIS — R3915 Urgency of urination: Secondary | ICD-10-CM | POA: Diagnosis not present

## 2016-06-02 DIAGNOSIS — F331 Major depressive disorder, recurrent, moderate: Secondary | ICD-10-CM | POA: Diagnosis not present

## 2016-06-02 DIAGNOSIS — F5105 Insomnia due to other mental disorder: Secondary | ICD-10-CM | POA: Diagnosis not present

## 2016-06-07 DIAGNOSIS — H0011 Chalazion right upper eyelid: Secondary | ICD-10-CM | POA: Diagnosis not present

## 2016-06-08 ENCOUNTER — Ambulatory Visit (INDEPENDENT_AMBULATORY_CARE_PROVIDER_SITE_OTHER): Payer: Medicare Other | Admitting: Family Medicine

## 2016-06-08 VITALS — BP 104/62 | HR 60 | Temp 98.3°F | Resp 12 | Wt 191.0 lb

## 2016-06-08 DIAGNOSIS — H0013 Chalazion right eye, unspecified eyelid: Secondary | ICD-10-CM | POA: Diagnosis not present

## 2016-06-08 DIAGNOSIS — R5383 Other fatigue: Secondary | ICD-10-CM | POA: Diagnosis not present

## 2016-06-08 DIAGNOSIS — I10 Essential (primary) hypertension: Secondary | ICD-10-CM | POA: Diagnosis not present

## 2016-06-08 DIAGNOSIS — F419 Anxiety disorder, unspecified: Secondary | ICD-10-CM | POA: Diagnosis not present

## 2016-06-08 DIAGNOSIS — H0016 Chalazion left eye, unspecified eyelid: Secondary | ICD-10-CM | POA: Diagnosis not present

## 2016-06-08 DIAGNOSIS — M545 Low back pain: Secondary | ICD-10-CM | POA: Diagnosis not present

## 2016-06-08 DIAGNOSIS — R0600 Dyspnea, unspecified: Secondary | ICD-10-CM

## 2016-06-08 NOTE — Progress Notes (Signed)
Subjective:  HPI  Patient is here for follow up after June 15th office. Issues addressed at that time were Dyspnea, Fatigue, hypotension. Routine labs were done including A1C, Sed rate, ESR-labs were ok, also did EKG and spirometry were stable. Referral to pulmonologist was made. Chest Xray was done and that was ok. Lisinopril was stopped.  She has been checking b/p at home and at her doctors, readings have been around 120/70. Her dyspnea is about 30% better and fatigue about 10% better. She is seen pulmonologist this Friday June 30th.   Prior to Admission medications   Medication Sig Start Date End Date Taking? Authorizing Provider  BIOTIN PO Take 5,000 mcg by mouth daily.     Historical Provider, MD  citalopram (CELEXA) 40 MG tablet  08/06/15   Historical Provider, MD  diazepam (VALIUM) 5 MG tablet Take 1/2 to 1 tablet daily as needed. 03/08/16   Richard Maceo Pro., MD  fish oil-omega-3 fatty acids 1000 MG capsule Take 1 g by mouth daily.      Historical Provider, MD  metoprolol tartrate (LOPRESSOR) 25 MG tablet TAKE 1 TABLET TWICE DAILY 02/09/16   Jerrol Banana., MD  montelukast (SINGULAIR) 10 MG tablet Take 1 tablet (10 mg total) by mouth at bedtime. 08/31/15   Richard Maceo Pro., MD  rosuvastatin (CRESTOR) 10 MG tablet Take 1 tablet (10 mg total) by mouth daily. 04/25/16   Richard Maceo Pro., MD  tolterodine (DETROL LA) 4 MG 24 hr capsule Take 4 mg by mouth daily.    Historical Provider, MD  TRAZODONE HCL PO Take 250 mg by mouth at bedtime.    Historical Provider, MD  trimethoprim (TRIMPEX) 100 MG tablet Take 100 mg by mouth 2 (two) times daily.    Historical Provider, MD    Patient Active Problem List   Diagnosis Date Noted  . Absolute anemia 07/30/2015  . BP (high blood pressure) 07/30/2015  . Edema extremities 07/30/2015  . Benign essential tremor 07/30/2015  . Anxiety, generalized 07/30/2015  . Gastro-esophageal reflux disease without esophagitis 07/30/2015  .  HLD (hyperlipidemia) 07/30/2015  . Cannot sleep 07/30/2015  . Lumbar radiculopathy 07/30/2015  . Depression, major, recurrent, in partial remission ([place]) 07/30/2015  . Arthritis, degenerative 07/30/2015  . Allergic rhinitis 07/30/2015  . Gastroduodenal ulcer 07/30/2015  . Calcium blood increased 07/30/2015  . Pre-syncope 05/17/2015  . Dyspnea on exertion 03/31/2015  . Essential hypertension 03/31/2015  . Bleeding stomach ulcer 10/14/2013  . Arthritis 10/14/2013  . Anemia, iron deficiency 10/14/2013  . Depression 10/14/2013  . Weakness generalized 10/14/2013  . Ejection fraction   . Pericardial effusion   . PONV (postoperative nausea and vomiting)   . Hyperlipidemia   . Palpitations   . Low back pain   . Normal nuclear stress test     Past Medical History  Diagnosis Date  . PONV (postoperative nausea and vomiting)   . Hypertension     a. takes Metoprolol, Lisinopril, and amlodipine.  . Hyperlipidemia     a. takes Simvastatin  . Palpitations     a. June 2013:  Event recorder normal sinus rhythm with rare PVCs - ? symptomatic PVC's.  . Low back pain   . Normal nuclear stress test     a. 07/2005 Nl nuc stress test.  . Ejection fraction     a. EF 55-65%, echo, mild LVH, June, 2013, small pericardial effusion  . Pericardial effusion     a. Small, echo,  June, 2013    Social History   Social History  . Marital Status: Divorced    Spouse Name: N/A  . Number of Children: N/A  . Years of Education: N/A   Occupational History  . Not on file.   Social History Main Topics  . Smoking status: Never Smoker   . Smokeless tobacco: Never Used  . Alcohol Use: 0.0 oz/week    0 Glasses of wine per week     Comment: drinks 3-4 times a year  . Drug Use: No  . Sexual Activity: No   Other Topics Concern  . Not on file   Social History Narrative    Allergies  Allergen Reactions  . Anti-Inflammatory Enzyme [Nutritional Supplements] Other (See Comments)    REACTION: Due to  bleeding ulcer  . Cymbalta [Duloxetine Hcl] Other (See Comments)    REACTION: Urinary retention  . Clarithromycin Other (See Comments)    Upsets stomach  . Clarithromycin     GI upset  . Nsaids     GI BLEED  . Ranitidine     severe diarrhea  . Venlafaxine     severe depression    Review of Systems  Constitutional: Positive for malaise/fatigue.  Respiratory: Positive for shortness of breath. Negative for cough and hemoptysis.   Cardiovascular: Negative.   Gastrointestinal: Negative.   Musculoskeletal: Positive for back pain and joint pain.  Neurological: Negative for weakness.  Psychiatric/Behavioral: Positive for depression.    Immunization History  Administered Date(s) Administered  . Influenza, High Dose Seasonal PF 09/01/2015  . Influenza-Unspecified 10/12/2013, 08/12/2014   Objective:  BP 104/62 mmHg  Pulse 60  Temp(Src) 98.3 F (36.8 C)  Resp 12  Wt 191 lb (86.637 kg)  SpO2 97%  Physical Exam  Constitutional: She is oriented to person, place, and time and well-developed, well-nourished, and in no distress.  HENT:  Head: Normocephalic and atraumatic.  Right Ear: External ear normal.  Left Ear: External ear normal.  Eyes: Conjunctivae are normal. Pupils are equal, round, and reactive to light.  Neck: Normal range of motion. Neck supple.  Cardiovascular: Normal rate, regular rhythm, normal heart sounds and intact distal pulses.   No murmur heard. Pulmonary/Chest: Effort normal and breath sounds normal. No respiratory distress. She has no wheezes.  Musculoskeletal: She exhibits no edema or tenderness.  Neurological: She is alert and oriented to person, place, and time.  Psychiatric: Mood, memory, affect and judgment normal.    Lab Results  Component Value Date   WBC 7.8 05/27/2016   HGB 14.7 10/28/2014   HCT 39.1 05/27/2016   PLT 264 05/27/2016   GLUCOSE 104* 04/22/2016   CHOL 189 05/27/2016   TRIG 188* 05/27/2016   HDL 47 05/27/2016   LDLCALC 104*  05/27/2016   TSH 0.920 05/27/2016   INR 0.94 09/02/2010   HGBA1C 5.6 05/27/2016    CMP     Component Value Date/Time   NA 141 04/22/2016 1128   NA 136 10/28/2014 1755   NA 140 11/25/2011 0608   K 5.2 04/22/2016 1128   K 4.1 10/28/2014 1755   CL 102 04/22/2016 1128   CL 102 10/28/2014 1755   CO2 26 04/22/2016 1128   CO2 34* 10/28/2014 1755   GLUCOSE 104* 04/22/2016 1128   GLUCOSE 107* 10/28/2014 1755   GLUCOSE 125* 11/25/2011 0608   BUN 21 04/22/2016 1128   BUN 16 10/28/2014 1755   BUN 7 11/25/2011 0608   CREATININE 1.33* 04/22/2016 1128   CREATININE 1.28  10/28/2014 1755   CALCIUM 9.8 04/22/2016 1128   CALCIUM 9.1 10/28/2014 1755   PROT 6.1 04/22/2016 1128   PROT 4.8* 10/05/2013 0553   PROT 6.1 09/02/2010 1129   ALBUMIN 3.9 04/22/2016 1128   ALBUMIN 3.6 10/28/2014 1755   ALBUMIN 3.7 09/02/2010 1129   AST 12 04/22/2016 1128   AST 13* 10/05/2013 0553   ALT 12 04/22/2016 1128   ALT 8* 10/05/2013 0553   ALKPHOS 83 04/22/2016 1128   ALKPHOS 73 10/05/2013 0553   BILITOT 0.4 04/22/2016 1128   BILITOT 0.2 10/05/2013 0553   BILITOT 0.4 09/02/2010 1129   GFRNONAA 41* 04/22/2016 1128   GFRNONAA 44* 10/28/2014 1755   GFRNONAA >60 10/06/2013 0518   GFRAA 47* 04/22/2016 1128   GFRAA 53* 10/28/2014 1755   GFRAA >60 10/06/2013 0518    Assessment and Plan :  1. Dyspnea 30% better. Advised patient to keep appointment with pulmonologist for June 30th.  2. Other fatigue 10% better.  3. Essential hypertension B/P better off Lisinopril. Will cut back on Metoprolol to 1/2 tablet twice daily, may need to start RX for Metoprolol Succinate 25 mg 1 tablet daily. Patient will let us know if patient can not cut her tablets at home.  4. Chalazion, bilateral Following ophthalmologist Dr. Edison Pace.  5. Acute anxiety Stable. Sees Dr. Nicolasa Ducking, encouraged patient to try and seek counseling.  6. Low back pain without sciatica, unspecified back pain laterality 7. History of GI bleed due to  PUD from NSAID with resulting anemia Patient was seen and examined by Dr. Eulas Post and note was scribed by Theressa Millard, RMA.    Miguel Aschoff MD [facility] 06/08/2016 1:42 PM

## 2016-06-10 ENCOUNTER — Ambulatory Visit (INDEPENDENT_AMBULATORY_CARE_PROVIDER_SITE_OTHER): Payer: Medicare Other | Admitting: Pulmonary Disease

## 2016-06-10 ENCOUNTER — Encounter: Payer: Self-pay | Admitting: Pulmonary Disease

## 2016-06-10 VITALS — BP 124/78 | HR 67 | Ht 67.0 in | Wt 191.0 lb

## 2016-06-10 DIAGNOSIS — R5381 Other malaise: Secondary | ICD-10-CM | POA: Diagnosis not present

## 2016-06-10 DIAGNOSIS — R0600 Dyspnea, unspecified: Secondary | ICD-10-CM

## 2016-06-10 MED ORDER — FLUTICASONE FUROATE-VILANTEROL 200-25 MCG/INH IN AEPB: 1.0000 | INHALATION_SPRAY | Freq: Every day | RESPIRATORY_TRACT | Status: DC

## 2016-06-10 NOTE — Progress Notes (Signed)
Patient seen in the office today and instructed on use of Breo.  Patient expressed understanding and demonstrated technique.  

## 2016-06-16 NOTE — Progress Notes (Signed)
PULMONARY CONSULT NOTE  Requesting MD/Service: Miguel Aschoff Date of initial consultation: 06/10/16 Reason for consultation: Dyspnea  PT PROFILE: 70 y.o. F referred for evaluation of unexplained exertional dyspnea.   HPI:  57 F referred for evaluation of progressive DOE over 2 yrs duration. She also noted palpitations which has now resolved. She has recently undergone a cardiac evaluation which has revealed no overt cardiac disease and no evident cause of dyspnea. Her dyspnea exhibits no significant day-to-day or season to season variation. She denies CP, fever, purulent sputum, hemoptysis, LE edema and calf tenderness. She does report intermittent nonproductive cough. She has undergone recent spirometry and reportedly had difficulty performing the maneuver. She has had a recent CXR which was normal. In the course of this encounter she reported numerous extreme stressors and reports "severe depression". Most of the stressors involve conflict with her son. She has been started on montelukast recently for "allergies" without improvement in her respiratory symptoms. She is notably sedentary   Past Medical History  Diagnosis Date  . PONV (postoperative nausea and vomiting)   . Hypertension     a. takes Metoprolol, Lisinopril, and amlodipine.  . Hyperlipidemia     a. takes Simvastatin  . Palpitations     a. June 2013:  Event recorder normal sinus rhythm with rare PVCs - ? symptomatic PVC's.  . Low back pain   . Normal nuclear stress test     a. 07/2005 Nl nuc stress test.  . Ejection fraction     a. EF 55-65%, echo, mild LVH, June, 2013, small pericardial effusion  . Pericardial effusion     a. Small, echo, June, 2013    Past Surgical History  Procedure Laterality Date  . Tonsillectomy      as child  . Tubal ligation      21yrs ago  . Thyroidectomy, partial    . Vaginal hysterectomy      34yrs ago d/t cervical cancer  . Bladder suspension  2003  . Bone spur  2009    removed from  right collar bone area and partial collar bone removed  . Back surgery  08/2010    MEDICATIONS: I have reviewed all medications and confirmed regimen as documented  Social History   Social History  . Marital Status: Divorced    Spouse Name: N/A  . Number of Children: N/A  . Years of Education: N/A   Occupational History  . Not on file.   Social History Main Topics  . Smoking status: Never Smoker   . Smokeless tobacco: Never Used  . Alcohol Use: 0.0 oz/week    0 Glasses of wine per week     Comment: drinks 3-4 times a year  . Drug Use: No  . Sexual Activity: No   Other Topics Concern  . Not on file   Social History Narrative    Family History  Problem Relation Age of Onset  . Colon cancer Mother   . Heart murmur Mother   . Heart disease Mother   . Angina Mother   . Hypotension Neg Hx   . Malignant hyperthermia Neg Hx   . Pseudochol deficiency Neg Hx   . Heart disease Father   . COPD Father   . Atrial fibrillation Brother     ROS: No fever, myalgias/arthralgias, unexplained weight loss or weight gain No new focal weakness or sensory deficits No otalgia, hearing loss, visual changes, nasal and sinus symptoms, mouth and throat problems No neck pain or adenopathy No  abdominal pain, N/V/D, diarrhea, change in bowel pattern No dysuria, change in urinary pattern   Filed Vitals:   06/10/16 0918  BP: 124/78  Pulse: 67  Height: 5\' 7"  (1.702 m)  Weight: 191 lb (86.637 kg)  SpO2: 94%     EXAM:  Gen: WDWN, No overt respiratory distress, tearful @ times HEENT: NCAT, sclera white, oropharynx normal Neck: Supple without LAN, thyromegaly, JVD Lungs: breath sounds: full, percussion: normal, No wheezes or other adventitious sounds Cardiovascular: RRR, no murmurs noted Abdomen: Soft, nontender, normal BS Ext: without clubbing, cyanosis, edema Neuro: CNs grossly intact, motor and sensory intact Skin: Limited exam, no lesions noted  DATA:   BMP Latest Ref Rng  04/22/2016 07/30/2015 10/28/2014  Glucose 65 - 99 mg/dL 104(H) 106(H) 107(H)  BUN 8 - 27 mg/dL 21 18 16   Creatinine 0.57 - 1.00 mg/dL 1.33(H) 0.89 1.28  BUN/Creat Ratio 12 - 28 16 20  -  Sodium 134 - 144 mmol/L 141 140 136  Potassium 3.5 - 5.2 mmol/L 5.2 4.8 4.1  Chloride 96 - 106 mmol/L 102 100 102  CO2 18 - 29 mmol/L 26 26 34(H)  Calcium 8.7 - 10.3 mg/dL 9.8 9.6 9.1    CBC Latest Ref Rng 05/27/2016 07/30/2015 10/28/2014  WBC 3.4 - 10.8 x10E3/uL 7.8 8.5 8.3  Hemoglobin 12.0-16.0 g/dL - - 14.7  Hematocrit 34.0 - 46.6 % 39.1 40.7 44.7  Platelets 150 - 379 x10E3/uL 264 268 258    CXR (05/26/16):  NACPD Spirometry (05/26/16): no obstruction  IMPRESSION:     ICD-9-CM ICD-10-CM   1. Dyspnea 786.09 R06.00 Pulmonary function test  2. Physical deconditioning 799.3 R53.81    Suspect there is a significant psychological component to her symptoms  PLAN:  Trial of Breo inhaler We discussed my impression that emotional issues are contributing to her symptoms ROV 6 weeks with full PFTs and 6 MWT   Merton Border, MD PCCM service Mobile [phone] Pager [phone] 06/16/2016

## 2016-06-23 DIAGNOSIS — R3915 Urgency of urination: Secondary | ICD-10-CM | POA: Diagnosis not present

## 2016-06-23 DIAGNOSIS — R32 Unspecified urinary incontinence: Secondary | ICD-10-CM | POA: Diagnosis not present

## 2016-06-24 ENCOUNTER — Telehealth: Payer: Self-pay

## 2016-06-24 NOTE — Telephone Encounter (Signed)
Pharmacy sent over fax stating that Diazepam will need to have PA, do you want to proceed or try a different medication?-aa

## 2016-06-27 NOTE — Telephone Encounter (Signed)
This is for anxiety or for muscle spasm? For spasm would try Soma 350 3 times a day when necessary. If for  anxiety would switch to Xanax 0.5 mg 3 times a day when necessary

## 2016-06-28 NOTE — Telephone Encounter (Signed)
Spoke with patient and she states that she wants to just taper off Diazepam, she does not take this medication daily and does not want to proceed with any other medications-this medication was for shakiness of her body at times. Will follow as needed-aa

## 2016-07-05 ENCOUNTER — Other Ambulatory Visit: Payer: Self-pay

## 2016-07-05 MED ORDER — ROSUVASTATIN CALCIUM 10 MG PO TABS: 10.0000 mg | ORAL_TABLET | Freq: Every day | ORAL | 3 refills | Status: DC

## 2016-07-05 MED ORDER — MONTELUKAST SODIUM 10 MG PO TABS: 10.0000 mg | ORAL_TABLET | Freq: Every day | ORAL | 3 refills | Status: DC

## 2016-07-08 ENCOUNTER — Other Ambulatory Visit: Payer: Self-pay

## 2016-07-08 MED ORDER — ROSUVASTATIN CALCIUM 10 MG PO TABS: 10.0000 mg | ORAL_TABLET | Freq: Every day | ORAL | 3 refills | Status: DC

## 2016-07-08 MED ORDER — MONTELUKAST SODIUM 10 MG PO TABS: 10.0000 mg | ORAL_TABLET | Freq: Every day | ORAL | 3 refills | Status: DC

## 2016-07-12 DIAGNOSIS — R32 Unspecified urinary incontinence: Secondary | ICD-10-CM | POA: Diagnosis not present

## 2016-07-14 ENCOUNTER — Ambulatory Visit (INDEPENDENT_AMBULATORY_CARE_PROVIDER_SITE_OTHER): Payer: Medicare Other | Admitting: *Deleted

## 2016-07-14 DIAGNOSIS — R0600 Dyspnea, unspecified: Secondary | ICD-10-CM

## 2016-07-14 DIAGNOSIS — R0609 Other forms of dyspnea: Secondary | ICD-10-CM

## 2016-07-14 LAB — PULMONARY FUNCTION TEST
DL/VA % pred: 64 %
DL/VA: 3.3 ml/min/mmHg/L
DLCO unc % pred: 82 %
DLCO unc: 23.35 ml/min/mmHg
FEF 25-75 Post: 2.09 L/sec
FEF 25-75 Pre: 2.16 L/sec
FEF2575-%Change-Post: -3 %
FEF2575-%Pred-Post: 102 %
FEF2575-%Pred-Pre: 105 %
FEV1-%Change-Post: 0 %
FEV1-%Pred-Post: 81 %
FEV1-%Pred-Pre: 81 %
FEV1-Post: 2.06 L
FEV1-Pre: 2.06 L
FEV1FVC-%Change-Post: 3 %
FEV1FVC-%Pred-Pre: 105 %
FEV6-%Change-Post: -3 %
FEV6-%Pred-Post: 77 %
FEV6-%Pred-Pre: 80 %
FEV6-Post: 2.47 L
FEV6-Pre: 2.56 L
FEV6FVC-%Pred-Post: 104 %
FEV6FVC-%Pred-Pre: 104 %
FVC-%Change-Post: -3 %
FVC-%Pred-Post: 73 %
FVC-%Pred-Pre: 76 %
FVC-Post: 2.47 L
FVC-Pre: 2.56 L
Post FEV1/FVC ratio: 83 %
Post FEV6/FVC ratio: 100 %
Pre FEV1/FVC ratio: 81 %
Pre FEV6/FVC Ratio: 100 %

## 2016-07-14 NOTE — Progress Notes (Signed)
PFT performed today with Nitrogen washout. 

## 2016-07-14 NOTE — Progress Notes (Signed)
SMW performed today. 

## 2016-07-20 ENCOUNTER — Ambulatory Visit (INDEPENDENT_AMBULATORY_CARE_PROVIDER_SITE_OTHER): Payer: Medicare Other | Admitting: Pulmonary Disease

## 2016-07-20 ENCOUNTER — Encounter: Payer: Self-pay | Admitting: Pulmonary Disease

## 2016-07-20 ENCOUNTER — Other Ambulatory Visit: Payer: Self-pay | Admitting: *Deleted

## 2016-07-20 VITALS — BP 114/64 | HR 69 | Ht 67.0 in | Wt 188.2 lb

## 2016-07-20 DIAGNOSIS — R0600 Dyspnea, unspecified: Secondary | ICD-10-CM

## 2016-07-20 DIAGNOSIS — R5381 Other malaise: Secondary | ICD-10-CM

## 2016-07-20 DIAGNOSIS — R05 Cough: Secondary | ICD-10-CM

## 2016-07-20 DIAGNOSIS — R059 Cough, unspecified: Secondary | ICD-10-CM

## 2016-07-20 MED ORDER — FLUTICASONE FUROATE-VILANTEROL 200-25 MCG/INH IN AEPB: 1.0000 | INHALATION_SPRAY | Freq: Every day | RESPIRATORY_TRACT | 0 refills | Status: AC

## 2016-07-20 MED ORDER — FLUTICASONE FUROATE-VILANTEROL 200-25 MCG/INH IN AEPB: 1.0000 | INHALATION_SPRAY | Freq: Every day | RESPIRATORY_TRACT | 2 refills | Status: DC

## 2016-07-28 NOTE — Progress Notes (Signed)
PULMONARY OFFICE F/U NOTE  Requesting MD/Service: Miguel Aschoff Date of initial consultation: 06/10/16 Reason for consultation: Dyspnea  PT PROFILE: 70 y.o. F referred for evaluation of unexplained exertional dyspnea.   INITIAL HPI/IMPRESSION/PLAN:  80 F referred for evaluation of progressive DOE over 2 yrs duration. She also noted palpitations which has now resolved. She has recently undergone a cardiac evaluation which has revealed no overt cardiac disease and no evident cause of dyspnea. Her dyspnea exhibits no significant day-to-day or season to season variation. She denies CP, fever, purulent sputum, hemoptysis, LE edema and calf tenderness. She does report intermittent nonproductive cough. She has undergone recent spirometry and reportedly had difficulty performing the maneuver. She has had a recent CXR which was normal. In the course of this encounter she reported numerous extreme stressors and reports "severe depression". Most of the stressors involve conflict with her son. She has been started on montelukast recently for "allergies" without improvement in her respiratory symptoms. She is notably sedentary. IMPRESSION: unexplained dyspnea, deconditioning, suspected psychological component. PLAN: Trial of Breo inhaler, discussed emotional stressors and possible contributing role, ROV 6 weeks with full PFTs   DATA: Echocardiogram 04/14/16: LVEF [id number], grade 2 diastolic dysfunction, LA mildly dilated Myoview 04/15/15: no evidence of ischemia PFTs 07/14/16: Normal spirometry, mild restriction, normal DLCO 6 MWT 07/14/16: 336 meters. Limited by arthritic pain in knees > dyspnea  ROV 07/20/16: cough resolved, dyspnea much improved. Tentative diagnosis of asthma. Continue Breo MDI. Recommended increasing activity level.   SUBJ: Cough is resolved. Dyspnea is much improved. Improvement attributed to [facility]. No new complaints. Denies CP, fever, purulent sputum, hemoptysis, LE edema and calf  tenderness  OBJ:  Vitals:   07/20/16 1115  BP: 114/64  Pulse: 69  SpO2: 94%  Weight: 85.4 kg (188 lb 3.2 oz)  Height: 5\' 7"  (1.702 m)     EXAM:  Gen: NAD HEENT: NCAT Lungs: No wheezes or other adventitious sounds Cardiovascular: RRR, no murmurs noted Abdomen: Soft, nontender, normal BS Ext: without clubbing, cyanosis, edema Neuro: grossly intact  DATA:   BMP Latest Ref Rng & Units 04/22/2016 07/30/2015 10/28/2014  Glucose 65 - 99 mg/dL 104(H) 106(H) 107(H)  BUN 8 - 27 mg/dL 21 18 16   Creatinine 0.57 - 1.00 mg/dL 1.33(H) 0.89 1.28  BUN/Creat Ratio 12 - 28 16 20  -  Sodium 134 - 144 mmol/L 141 140 136  Potassium 3.5 - 5.2 mmol/L 5.2 4.8 4.1  Chloride 96 - 106 mmol/L 102 100 102  CO2 18 - 29 mmol/L 26 26 34(H)  Calcium 8.7 - 10.3 mg/dL 9.8 9.6 9.1    CBC Latest Ref Rng & Units 05/27/2016 07/30/2015 10/28/2014  WBC 3.4 - 10.8 x10E3/uL 7.8 8.5 8.3  Hemoglobin 12.0 - 16.0 g/dL - - 14.7  Hematocrit 34.0 - 46.6 % 39.1 40.7 44.7  Platelets 150 - 379 x10E3/uL 264 268 258    CXR: NNF  IMPRESSION:     ICD-9-CM ICD-10-CM   1. Cough, resolved 786.2 R05   2. Dyspnea, improved 786.09 R06.00   3. Physical deconditioning 799.3 R53.81    Tentative diagnosis of asthma  PLAN:  Cont Breo inhaler Increase activity level with goal of reconditioning ROV PRN   Merton Border, MD PCCM service Mobile [phone] Pager [phone] 07/28/2016

## 2016-08-01 ENCOUNTER — Ambulatory Visit (INDEPENDENT_AMBULATORY_CARE_PROVIDER_SITE_OTHER): Payer: Medicare Other | Admitting: Family Medicine

## 2016-08-01 VITALS — BP 114/74 | HR 82 | Temp 98.2°F | Resp 16 | Wt 189.0 lb

## 2016-08-01 DIAGNOSIS — R0600 Dyspnea, unspecified: Secondary | ICD-10-CM | POA: Diagnosis not present

## 2016-08-01 DIAGNOSIS — F419 Anxiety disorder, unspecified: Secondary | ICD-10-CM

## 2016-08-01 DIAGNOSIS — G3184 Mild cognitive impairment, so stated: Secondary | ICD-10-CM | POA: Diagnosis not present

## 2016-08-01 DIAGNOSIS — M1990 Unspecified osteoarthritis, unspecified site: Secondary | ICD-10-CM | POA: Diagnosis not present

## 2016-08-01 DIAGNOSIS — R5383 Other fatigue: Secondary | ICD-10-CM | POA: Diagnosis not present

## 2016-08-01 DIAGNOSIS — G250 Essential tremor: Secondary | ICD-10-CM | POA: Diagnosis not present

## 2016-08-01 DIAGNOSIS — B354 Tinea corporis: Secondary | ICD-10-CM | POA: Diagnosis not present

## 2016-08-01 MED ORDER — LORAZEPAM 0.5 MG PO TABS: ORAL_TABLET | ORAL | 5 refills | Status: DC

## 2016-08-01 MED ORDER — METOPROLOL TARTRATE 25 MG PO TABS: 12.5000 mg | ORAL_TABLET | Freq: Two times a day (BID) | ORAL | 12 refills | Status: DC

## 2016-08-01 MED ORDER — CLOTRIMAZOLE 1 % EX CREA
1.0000 | TOPICAL_CREAM | Freq: Two times a day (BID) | CUTANEOUS | 0 refills | Status: DC
Start: 2016-08-01 — End: 2016-10-04

## 2016-08-01 NOTE — Progress Notes (Signed)
Subjective:  HPI  Patient is here for follow up after June visit.  Dyspnea: she is seen pulmonologist. She did have stress test done also.  Patient states Dr Nicolasa Ducking does not like patient been on Diazepam and that it is affects her memory but this medicaitoin helps her tremor. She is concerned with her memory.  Also noticed a rash about 5 weeks ago. Itching is present, rash is in a round like circle and redness is present, rash is in the torso area.  MMSE - Mini Mental State Exam 08/01/2016  Orientation to time 5  Orientation to Place 5  Registration 3  Attention/ Calculation 5  Recall 2  Language- name 2 objects 2  Language- repeat 1  Language- follow 3 step command 3  Language- read & follow direction 1  Write a sentence 1  Copy design 1  Total score 29    Prior to Admission medications   Medication Sig Start Date End Date Taking? Authorizing Provider  BIOTIN PO Take 5,000 mcg by mouth daily.    Yes Historical Provider, MD  citalopram (CELEXA) 40 MG tablet  08/06/15  Yes Historical Provider, MD  diazepam (VALIUM) 5 MG tablet Take 1/2 to 1 tablet daily as needed. 03/08/16  Yes Richard Maceo Pro., MD  fish oil-omega-3 fatty acids 1000 MG capsule Take 1 g by mouth daily.     Yes Historical Provider, MD  fluticasone furoate-vilanterol (BREO ELLIPTA) 200-25 MCG/INH AEPB Inhale 1 puff into the lungs daily. 07/20/16  Yes Wilhelmina Mcardle, MD  metoprolol tartrate (LOPRESSOR) 25 MG tablet TAKE 1 TABLET TWICE DAILY Patient taking differently: TAKE 1/2 TABLET TWICE DAILY 02/09/16  Yes Richard Maceo Pro., MD  montelukast (SINGULAIR) 10 MG tablet Take 1 tablet (10 mg total) by mouth at bedtime. 07/08/16  Yes Richard Maceo Pro., MD  rosuvastatin (CRESTOR) 10 MG tablet Take 1 tablet (10 mg total) by mouth daily. 07/08/16  Yes Richard Maceo Pro., MD  tolterodine (DETROL LA) 4 MG 24 hr capsule Take 4 mg by mouth daily.   Yes Historical Provider, MD  TRAZODONE HCL PO Take 250 mg by mouth  at bedtime.   Yes Historical Provider, MD  trimethoprim (TRIMPEX) 100 MG tablet Take 100 mg by mouth daily.    Yes Historical Provider, MD    Patient Active Problem List   Diagnosis Date Noted  . Cough, resolved 07/20/2016  . Chalazion, bilateral 06/08/2016  . Absolute anemia 07/30/2015  . BP (high blood pressure) 07/30/2015  . Edema extremities 07/30/2015  . Benign essential tremor 07/30/2015  . Anxiety, generalized 07/30/2015  . Gastro-esophageal reflux disease without esophagitis 07/30/2015  . HLD (hyperlipidemia) 07/30/2015  . Cannot sleep 07/30/2015  . Lumbar radiculopathy 07/30/2015  . Depression, major, recurrent, in partial remission ([place]) 07/30/2015  . Arthritis, degenerative 07/30/2015  . Allergic rhinitis 07/30/2015  . Gastroduodenal ulcer 07/30/2015  . Calcium blood increased 07/30/2015  . Pre-syncope 05/17/2015  . Dyspnea 03/31/2015  . Essential hypertension 03/31/2015  . Bleeding stomach ulcer 10/14/2013  . Arthritis 10/14/2013  . Anemia, iron deficiency 10/14/2013  . Depression 10/14/2013  . Weakness generalized 10/14/2013  . Ejection fraction   . Pericardial effusion   . PONV (postoperative nausea and vomiting)   . Hyperlipidemia   . Palpitations   . Low back pain   . Normal nuclear stress test     Past Medical History:  Diagnosis Date  . Ejection fraction    a. EF 55-65%, echo,  mild LVH, June, 2013, small pericardial effusion  . Hyperlipidemia    a. takes Simvastatin  . Hypertension    a. takes Metoprolol, Lisinopril, and amlodipine.  . Low back pain   . Normal nuclear stress test    a. 07/2005 Nl nuc stress test.  . Palpitations    a. June 2013:  Event recorder normal sinus rhythm with rare PVCs - ? symptomatic PVC's.  . Pericardial effusion    a. Small, echo, June, 2013  . PONV (postoperative nausea and vomiting)     Social History   Social History  . Marital status: Divorced    Spouse name: N/A  . Number of children: N/A  . Years of  education: N/A   Occupational History  . Not on file.   Social History Main Topics  . Smoking status: Never Smoker  . Smokeless tobacco: Never Used  . Alcohol use 0.0 oz/week     Comment: drinks 3-4 times a year  . Drug use: No  . Sexual activity: No   Other Topics Concern  . Not on file   Social History Narrative  . No narrative on file    Allergies  Allergen Reactions  . Anti-Inflammatory Enzyme [Nutritional Supplements] Other (See Comments)    REACTION: Due to bleeding ulcer  . Cymbalta [Duloxetine Hcl] Other (See Comments)    REACTION: Urinary retention  . Clarithromycin Other (See Comments)    Upsets stomach  . Clarithromycin     GI upset  . Nsaids     GI BLEED  . Ranitidine     severe diarrhea  . Venlafaxine     severe depression    Review of Systems  Constitutional: Positive for malaise/fatigue.  Eyes: Negative.   Respiratory: Positive for shortness of breath.   Cardiovascular: Negative.   Gastrointestinal: Negative.   Musculoskeletal: Positive for back pain and joint pain.  Skin: Positive for itching and rash.  Neurological: Positive for tremors.       Legs moving/jumpy at night a lot  Endo/Heme/Allergies: Negative.   Psychiatric/Behavioral: Positive for memory loss. The patient is nervous/anxious.     Immunization History  Administered Date(s) Administered  . Influenza, High Dose Seasonal PF 09/01/2015  . Influenza-Unspecified 10/12/2013, 08/12/2014, 09/04/2014  . Pneumococcal Conjugate-13 03/26/2015  . Pneumococcal Polysaccharide-23 10/11/1999, 04/10/2012  . Td 08/23/1999  . Zoster 09/26/2008   Objective:  BP 114/74   Pulse 82   Temp 98.2 F (36.8 C)   Resp 16   Wt 189 lb (85.7 kg)   BMI 29.60 kg/m   Physical Exam  Constitutional: She is oriented to person, place, and time and well-developed, well-nourished, and in no distress.  HENT:  Head: Normocephalic and atraumatic.  Right Ear: External ear normal.  Left Ear: External ear  normal.  Nose: Nose normal.  Eyes: Conjunctivae are normal. Pupils are equal, round, and reactive to light.  Neck: Normal range of motion. Neck supple.  Cardiovascular: Normal rate, regular rhythm, normal heart sounds and intact distal pulses.   No murmur heard. Pulmonary/Chest: Effort normal and breath sounds normal. No respiratory distress. She has no wheezes.  Abdominal: Soft.  Musculoskeletal: She exhibits no edema or tenderness.  Neurological: She is alert and oriented to person, place, and time.  Skin: Skin is warm and dry. Rash noted.  Psychiatric: Mood normal.  Small rash on trunk consistent with tinea corporis  Lab Results  Component Value Date   WBC 7.8 05/27/2016   HGB 14.7 10/28/2014   HCT  39.1 05/27/2016   PLT 264 05/27/2016   GLUCOSE 104 (H) 04/22/2016   CHOL 189 05/27/2016   TRIG 188 (H) 05/27/2016   HDL 47 05/27/2016   LDLCALC 104 (H) 05/27/2016   TSH 0.920 05/27/2016   INR 0.94 09/02/2010   HGBA1C 5.6 05/27/2016    CMP     Component Value Date/Time   NA 141 04/22/2016 1128   NA 136 10/28/2014 1755   K 5.2 04/22/2016 1128   K 4.1 10/28/2014 1755   CL 102 04/22/2016 1128   CL 102 10/28/2014 1755   CO2 26 04/22/2016 1128   CO2 34 (H) 10/28/2014 1755   GLUCOSE 104 (H) 04/22/2016 1128   GLUCOSE 107 (H) 10/28/2014 1755   BUN 21 04/22/2016 1128   BUN 16 10/28/2014 1755   CREATININE 1.33 (H) 04/22/2016 1128   CREATININE 1.28 10/28/2014 1755   CALCIUM 9.8 04/22/2016 1128   CALCIUM 9.1 10/28/2014 1755   PROT 6.1 04/22/2016 1128   PROT 4.8 (L) 10/05/2013 0553   ALBUMIN 3.9 04/22/2016 1128   ALBUMIN 3.6 10/28/2014 1755   AST 12 04/22/2016 1128   AST 13 (L) 10/05/2013 0553   ALT 12 04/22/2016 1128   ALT 8 (L) 10/05/2013 0553   ALKPHOS 83 04/22/2016 1128   ALKPHOS 73 10/05/2013 0553   BILITOT 0.4 04/22/2016 1128   BILITOT 0.2 10/05/2013 0553   GFRNONAA 41 (L) 04/22/2016 1128   GFRNONAA 44 (L) 10/28/2014 1755   GFRNONAA >60 10/06/2013 0518   GFRAA 47  (L) 04/22/2016 1128   GFRAA 53 (L) 10/28/2014 1755   GFRAA >60 10/06/2013 0518    Assessment and Plan :  1. Other fatigue  2. Acute anxiety  3. Benign essential tremor/RLS Discussed the risks of taking Diazepam and the memory issues she is having now. Discussed changing medications. Will try Lorazepam and stop Diazepam.I would like to totally get her off benzodiazepines but she has severe anxiety. He has chronic back pain and the Valium has helped her with the back pain. I do think the best long-term solution is to get her off all benzodiazepines. I'm worried that it is contributing to her mild cognitive decline.  4. Arthritis In the knees.  5. Dyspnea Seen pulmonologist.  6. MCI (mild cognitive impairment) MMSE 29/30. Most likely from long term use of Diazepam. Discussed this issue with the patient today.  7. Tinea corporis  Patient was seen and examined by Dr. Eulas Post and note was scribed by Theressa Millard, RMA.  I have done the exam and reviewed the above chart and it is accurate to the best of my knowledge.   Miguel Aschoff MD [facility] 08/01/2016 1:44 PM

## 2016-08-02 ENCOUNTER — Ambulatory Visit: Payer: Medicare Other | Admitting: Family Medicine

## 2016-08-02 DIAGNOSIS — N3941 Urge incontinence: Secondary | ICD-10-CM | POA: Diagnosis not present

## 2016-08-08 DIAGNOSIS — H01003 Unspecified blepharitis right eye, unspecified eyelid: Secondary | ICD-10-CM | POA: Diagnosis not present

## 2016-08-12 DIAGNOSIS — M25562 Pain in left knee: Secondary | ICD-10-CM | POA: Diagnosis not present

## 2016-08-12 DIAGNOSIS — M25561 Pain in right knee: Secondary | ICD-10-CM | POA: Diagnosis not present

## 2016-08-12 DIAGNOSIS — M170 Bilateral primary osteoarthritis of knee: Secondary | ICD-10-CM | POA: Diagnosis not present

## 2016-08-30 DIAGNOSIS — R3915 Urgency of urination: Secondary | ICD-10-CM | POA: Diagnosis not present

## 2016-09-22 DIAGNOSIS — F5105 Insomnia due to other mental disorder: Secondary | ICD-10-CM | POA: Diagnosis not present

## 2016-09-22 DIAGNOSIS — F331 Major depressive disorder, recurrent, moderate: Secondary | ICD-10-CM | POA: Diagnosis not present

## 2016-09-27 DIAGNOSIS — R3915 Urgency of urination: Secondary | ICD-10-CM | POA: Diagnosis not present

## 2016-10-04 ENCOUNTER — Encounter: Payer: Self-pay | Admitting: Family Medicine

## 2016-10-04 ENCOUNTER — Ambulatory Visit (INDEPENDENT_AMBULATORY_CARE_PROVIDER_SITE_OTHER): Payer: Medicare Other | Admitting: Family Medicine

## 2016-10-04 VITALS — BP 106/64 | HR 72 | Temp 98.0°F | Resp 16 | Wt 183.0 lb

## 2016-10-04 DIAGNOSIS — G250 Essential tremor: Secondary | ICD-10-CM

## 2016-10-04 DIAGNOSIS — F411 Generalized anxiety disorder: Secondary | ICD-10-CM | POA: Diagnosis not present

## 2016-10-04 DIAGNOSIS — N3090 Cystitis, unspecified without hematuria: Secondary | ICD-10-CM

## 2016-10-04 LAB — POCT URINALYSIS DIPSTICK
Bilirubin, UA: NEGATIVE
Blood, UA: NEGATIVE
Glucose, UA: NEGATIVE
Ketones, UA: NEGATIVE
Nitrite, UA: NEGATIVE
Spec Grav, UA: 1.02
Urobilinogen, UA: NEGATIVE
pH, UA: 6

## 2016-10-04 MED ORDER — DIAZEPAM 5 MG PO TABS: 2.5000 mg | ORAL_TABLET | Freq: Two times a day (BID) | ORAL | 2 refills | Status: DC | PRN

## 2016-10-04 MED ORDER — NITROFURANTOIN MONOHYD MACRO 100 MG PO CAPS: 100.0000 mg | ORAL_CAPSULE | Freq: Two times a day (BID) | ORAL | 2 refills | Status: DC

## 2016-10-04 NOTE — Patient Instructions (Signed)
Drink plenty of fluids, especially cranberry juice.

## 2016-10-04 NOTE — Progress Notes (Signed)
[name] Female    DOB: Jun 09, 1946   70 y.o.   MRN: [id number] Visit Date: 10/04/2016  Today's Provider: Wilhemena Durie, MD   Chief Complaint  Patient presents with  . Tremors  . Urinary Tract Infection   Subjective:    Urinary Tract Infection   This is a new problem. The current episode started yesterday. The problem has been gradually worsening. The pain is mild. There has been no fever. Associated symptoms include flank pain, frequency and urgency. Pertinent negatives include no chills, discharge, hematuria, hesitancy, nausea, sweats or vomiting. Her past medical history is significant for recurrent UTIs.  Pt's main complaint is back pain and incontinence.    Benign Essential Tremor At LOV, PCP changed pt's Diazepam to Lorazepam 0.5 mg tablets. Pt reports this provides no relief of sx. Pt reports Dr. Nicolasa Ducking does not like pt being on any benzos. Pt reports she was also taking Valium for back spasms, as well as anxiety. Pt states her anxiety was better with Valium. Dr. Nicolasa Ducking wants pt to see neurologist for tremors.      Allergies  Allergen Reactions  . Anti-Inflammatory Enzyme [Nutritional Supplements] Other (See Comments)    REACTION: Due to bleeding ulcer  . Cymbalta [Duloxetine Hcl] Other (See Comments)    REACTION: Urinary retention  . Clarithromycin Other (See Comments)    Upsets stomach  . Clarithromycin     GI upset  . Nsaids     GI BLEED  . Ranitidine     severe diarrhea  . Venlafaxine     severe depression     Current Outpatient Prescriptions:  .  BIOTIN PO, Take 5,000 mcg by mouth daily. , Disp: , Rfl:  .  citalopram (CELEXA) 40 MG tablet, , Disp: , Rfl:  .  fish oil-omega-3 fatty acids 1000 MG capsule, Take 1 g by mouth daily.  , Disp: , Rfl:  .  fluticasone furoate-vilanterol (BREO ELLIPTA) 200-25 MCG/INH AEPB, Inhale 1 puff into the lungs daily., Disp: 180 each, Rfl: 2 .  LORazepam (ATIVAN) 0.5 MG tablet, 1/2 to 1 tablet  daily as needed for tremor and for restless leg, Disp: 30 tablet, Rfl: 5 .  metoprolol tartrate (LOPRESSOR) 25 MG tablet, Take 0.5 tablets (12.5 mg total) by mouth 2 (two) times daily., Disp: 60 tablet, Rfl: 12 .  montelukast (SINGULAIR) 10 MG tablet, Take 1 tablet (10 mg total) by mouth at bedtime., Disp: 90 tablet, Rfl: 3 .  rosuvastatin (CRESTOR) 10 MG tablet, Take 1 tablet (10 mg total) by mouth daily., Disp: 90 tablet, Rfl: 3 .  tolterodine (DETROL LA) 4 MG 24 hr capsule, Take 4 mg by mouth daily., Disp: , Rfl:  .  TRAZODONE HCL PO, Take 250 mg by mouth at bedtime., Disp: , Rfl:  .  trimethoprim (TRIMPEX) 100 MG tablet, Take 100 mg by mouth daily. , Disp: , Rfl:   Review of Systems  Constitutional: Negative for chills.  Eyes: Negative.   Respiratory: Negative.   Cardiovascular: Negative.   Gastrointestinal: Negative for nausea and vomiting.  Genitourinary: Positive for flank pain, frequency and urgency. Negative for hematuria and hesitancy.  Allergic/Immunologic: Negative.   Neurological: Positive for tremors.  Psychiatric/Behavioral: The patient is nervous/anxious.     Social History  Substance Use Topics  . Smoking status: Never Smoker  . Smokeless tobacco: Never Used  . Alcohol use 0.0 oz/week     Comment: drinks 3-4 times a year  Objective:   BP 106/64 (BP Location: Left Arm, Patient Position: Sitting, Cuff Size: Normal)   Pulse 72   Temp 98 F (36.7 C) (Oral)   Resp 16   Wt 183 lb (83 kg)   BMI 28.66 kg/m   Physical Exam  Constitutional: She appears well-developed and well-nourished. No distress.  Cardiovascular: Normal rate, regular rhythm and normal heart sounds.   Pulmonary/Chest: Effort normal and breath sounds normal. No respiratory distress.  Abdominal: Soft. There is tenderness.  Skin: She is not diaphoretic.  Psychiatric: She has a normal mood and affect. Her behavior is normal.        Assessment & Plan:     1. Benign essential tremor Not to goal  after changing medications. D/C Lorazepam and restart Valium at low dose (2.5 mg) daily as needed, taper to QOD prn, etc. Will refer to neurology as below. - Ambulatory referral to Neurology - diazepam (VALIUM) 5 MG tablet; Take 0.5-1 tablets (2.5-5 mg total) by mouth every 12 (twelve) hours as needed for anxiety.  Dispense: 30 tablet; Refill: 2  2. Anxiety, generalized See plan above.  3. Cystitis Treat as below. FU pending cx. - POCT urinalysis dipstick - Urine culture - nitrofurantoin, macrocrystal-monohydrate, (MACROBID) 100 MG capsule; Take 1 capsule (100 mg total) by mouth 2 (two) times daily.  Dispense: 6 capsule; Refill: 2   Results for orders placed or performed in visit on 10/04/16  POCT urinalysis dipstick  Result Value Ref Range   Color, UA amber    Clarity, UA clear    Glucose, UA Negative    Bilirubin, UA Negative    Ketones, UA Negative    Spec Grav, UA 1.020    Blood, UA Negative    pH, UA 6.0    Protein, UA trace    Urobilinogen, UA negative    Nitrite, UA Negative    Leukocytes, UA moderate (2+) (A) Negative       Patient seen and examined by Miguel Aschoff, MD, and note scribed by Renaldo Fiddler, CMA.  I have done the exam and reviewed the above chart and it is accurate to the best of my knowledge.  Richard Cranford Mon, MD  [facility]

## 2016-10-07 ENCOUNTER — Telehealth: Payer: Self-pay | Admitting: Family Medicine

## 2016-10-07 NOTE — Telephone Encounter (Signed)
Discussed refilling Macrobid as improving.

## 2016-10-07 NOTE — Telephone Encounter (Signed)
Pt states she was seen Tuesday by Dr Rosanna Randy for a UTI.  Pt is still having  back and lower abdomen pain.  Pt has taken all medication given by Dr Rosanna Randy and is concerned with still having pain.  [employer].  CB#[phone]/MW

## 2016-10-10 LAB — URINE CULTURE

## 2016-10-11 DIAGNOSIS — Z6826 Body mass index (BMI) 26.0-26.9, adult: Secondary | ICD-10-CM | POA: Diagnosis not present

## 2016-10-11 DIAGNOSIS — M4316 Spondylolisthesis, lumbar region: Secondary | ICD-10-CM | POA: Diagnosis not present

## 2016-10-11 DIAGNOSIS — M545 Low back pain: Secondary | ICD-10-CM | POA: Diagnosis not present

## 2016-10-11 NOTE — Telephone Encounter (Signed)
No--push fluids

## 2016-10-11 NOTE — Telephone Encounter (Signed)
Pt advised of urine culture results. She states all together she took [place] for 6 days and she is much better still has some abdominal pain and some back pain but no unusual urinary issues. Does she need to refill Macrobid one more time? If she does not answer can leave a message on her voicemail machine-aa

## 2016-10-11 NOTE — Telephone Encounter (Signed)
Pt advised on voicemail-aa 

## 2016-10-11 NOTE — Telephone Encounter (Signed)
Pt is called to get results from urine culture.  Pt is also still having some lower abdominal pain.  Pt is requesting a call back.   [phone]

## 2016-10-14 DIAGNOSIS — R2681 Unsteadiness on feet: Secondary | ICD-10-CM | POA: Diagnosis not present

## 2016-10-14 DIAGNOSIS — R413 Other amnesia: Secondary | ICD-10-CM | POA: Diagnosis not present

## 2016-10-14 DIAGNOSIS — G250 Essential tremor: Secondary | ICD-10-CM | POA: Diagnosis not present

## 2016-10-17 ENCOUNTER — Other Ambulatory Visit: Payer: Self-pay | Admitting: Neurology

## 2016-10-17 DIAGNOSIS — G250 Essential tremor: Secondary | ICD-10-CM

## 2016-10-17 DIAGNOSIS — R413 Other amnesia: Secondary | ICD-10-CM

## 2016-10-24 ENCOUNTER — Ambulatory Visit
Admission: RE | Admit: 2016-10-24 | Discharge: 2016-10-24 | Disposition: A | Discharge status: Home / Self Care | Payer: Medicare Other | Source: Ambulatory Visit | Attending: Neurology | Admitting: Neurology

## 2016-10-24 DIAGNOSIS — R2681 Unsteadiness on feet: Secondary | ICD-10-CM | POA: Insufficient documentation

## 2016-10-24 DIAGNOSIS — R413 Other amnesia: Secondary | ICD-10-CM | POA: Diagnosis not present

## 2016-10-24 DIAGNOSIS — G250 Essential tremor: Secondary | ICD-10-CM | POA: Insufficient documentation

## 2016-10-24 DIAGNOSIS — G319 Degenerative disease of nervous system, unspecified: Secondary | ICD-10-CM | POA: Insufficient documentation

## 2016-10-24 DIAGNOSIS — G20 Parkinson's disease: Secondary | ICD-10-CM | POA: Diagnosis not present

## 2016-11-07 ENCOUNTER — Telehealth: Payer: Self-pay | Admitting: Family Medicine

## 2016-11-07 DIAGNOSIS — J349 Unspecified disorder of nose and nasal sinuses: Secondary | ICD-10-CM

## 2016-11-07 NOTE — Telephone Encounter (Signed)
Pt stated that we referred her to Dr. Manuella Ghazi and after he saw her MRI he stated she needed to see an ENT. Pt is requesting a referral to ENT asap. Please advise. Thanks TNP

## 2016-11-07 NOTE — Telephone Encounter (Signed)
Please review-aa 

## 2016-11-08 DIAGNOSIS — R3915 Urgency of urination: Secondary | ICD-10-CM | POA: Diagnosis not present

## 2016-11-08 DIAGNOSIS — R32 Unspecified urinary incontinence: Secondary | ICD-10-CM | POA: Diagnosis not present

## 2016-11-08 NOTE — Telephone Encounter (Signed)
Would you like referral STAT per patients request? And what would be diagnosis linked to order? KW

## 2016-11-08 NOTE — Telephone Encounter (Signed)
Refer to ENT for sinus changes on MRI.

## 2016-11-08 NOTE — Telephone Encounter (Signed)
ok 

## 2016-11-18 DIAGNOSIS — J329 Chronic sinusitis, unspecified: Secondary | ICD-10-CM | POA: Diagnosis not present

## 2016-12-16 DIAGNOSIS — G3184 Mild cognitive impairment, so stated: Secondary | ICD-10-CM | POA: Diagnosis not present

## 2016-12-16 DIAGNOSIS — Z8659 Personal history of other mental and behavioral disorders: Secondary | ICD-10-CM | POA: Diagnosis not present

## 2016-12-16 DIAGNOSIS — E559 Vitamin D deficiency, unspecified: Secondary | ICD-10-CM | POA: Diagnosis not present

## 2016-12-16 DIAGNOSIS — R259 Unspecified abnormal involuntary movements: Secondary | ICD-10-CM | POA: Diagnosis not present

## 2016-12-16 DIAGNOSIS — E538 Deficiency of other specified B group vitamins: Secondary | ICD-10-CM | POA: Diagnosis not present

## 2016-12-16 DIAGNOSIS — G250 Essential tremor: Secondary | ICD-10-CM | POA: Diagnosis not present

## 2016-12-18 DIAGNOSIS — G3184 Mild cognitive impairment, so stated: Secondary | ICD-10-CM | POA: Insufficient documentation

## 2016-12-18 DIAGNOSIS — E538 Deficiency of other specified B group vitamins: Secondary | ICD-10-CM | POA: Insufficient documentation

## 2016-12-18 DIAGNOSIS — E559 Vitamin D deficiency, unspecified: Secondary | ICD-10-CM | POA: Insufficient documentation

## 2016-12-18 DIAGNOSIS — R259 Unspecified abnormal involuntary movements: Secondary | ICD-10-CM | POA: Insufficient documentation

## 2016-12-21 DIAGNOSIS — E538 Deficiency of other specified B group vitamins: Secondary | ICD-10-CM | POA: Diagnosis not present

## 2016-12-22 DIAGNOSIS — J329 Chronic sinusitis, unspecified: Secondary | ICD-10-CM | POA: Diagnosis not present

## 2016-12-30 DIAGNOSIS — E538 Deficiency of other specified B group vitamins: Secondary | ICD-10-CM | POA: Diagnosis not present

## 2017-01-04 ENCOUNTER — Ambulatory Visit: Payer: Medicare Other | Admitting: Physician Assistant

## 2017-01-04 ENCOUNTER — Ambulatory Visit (INDEPENDENT_AMBULATORY_CARE_PROVIDER_SITE_OTHER): Payer: Medicare Other

## 2017-01-04 VITALS — BP 108/70 | HR 72 | Temp 98.7°F | Resp 16 | Wt 180.8 lb

## 2017-01-04 VITALS — BP 108/70 | HR 72 | Temp 98.7°F | Ht 66.75 in | Wt 180.8 lb

## 2017-01-04 DIAGNOSIS — Z1159 Encounter for screening for other viral diseases: Secondary | ICD-10-CM

## 2017-01-04 DIAGNOSIS — E7800 Pure hypercholesterolemia, unspecified: Secondary | ICD-10-CM | POA: Diagnosis not present

## 2017-01-04 DIAGNOSIS — I10 Essential (primary) hypertension: Secondary | ICD-10-CM

## 2017-01-04 DIAGNOSIS — Z0000 Encounter for general adult medical examination without abnormal findings: Secondary | ICD-10-CM

## 2017-01-04 DIAGNOSIS — E538 Deficiency of other specified B group vitamins: Secondary | ICD-10-CM | POA: Diagnosis not present

## 2017-01-04 DIAGNOSIS — D509 Iron deficiency anemia, unspecified: Secondary | ICD-10-CM

## 2017-01-04 NOTE — Progress Notes (Signed)
Subjective:   Nancy Blackburn is a 71 y.o. female who presents for Medicare Annual (Subsequent) preventive examination.  Review of Systems:  N/A  Cardiac Risk Factors include: advanced age (>91men, >23 women);dyslipidemia;hypertension     Objective:     Vitals: BP 108/70 (BP Location: Right Arm)   Pulse 72   Temp 98.7 F (37.1 C) (Oral)   Ht 5' 6.75" (1.695 m)   Wt 180 lb 12.8 oz (82 kg)   BMI 28.53 kg/m   Body mass index is 28.53 kg/m.   Tobacco History  Smoking Status  . Never Smoker  Smokeless Tobacco  . Never Used     Counseling given: Not Answered   Past Medical History:  Diagnosis Date  . Chronic sinus infection   . Ejection fraction    a. EF 55-65%, echo, mild LVH, June, 2013, small pericardial effusion  . Hyperlipidemia    a. takes Simvastatin  . Hypertension    a. takes Metoprolol, Lisinopril, and amlodipine.  . Low back pain   . Mild dementia   . Nasal polyps   . Normal nuclear stress test    a. 07/2005 Nl nuc stress test.  . Palpitations    a. June 2013:  Event recorder normal sinus rhythm with rare PVCs - ? symptomatic PVC's.  . Parkinson disease ([place])   . Pericardial effusion    a. Small, echo, June, 2013  . PONV (postoperative nausea and vomiting)    Past Surgical History:  Procedure Laterality Date  . BACK SURGERY  08/2010  . BLADDER SUSPENSION  2003  . bone spur  2009   removed from right collar bone area and partial collar bone removed  . THYROIDECTOMY, PARTIAL    . TONSILLECTOMY     as child  . TUBAL LIGATION     50yrs ago  . VAGINAL HYSTERECTOMY     55yrs ago d/t cervical cancer   Family History  Problem Relation Age of Onset  . Colon cancer Mother   . Heart murmur Mother   . Heart disease Mother   . Angina Mother   . Heart disease Father   . COPD Father   . Atrial fibrillation Brother   . Cushing syndrome Daughter   . Cancer Daughter     thyroid  . Hypotension Neg Hx   . Malignant hyperthermia Neg Hx   .  Pseudochol deficiency Neg Hx    History  Sexual Activity  . Sexual activity: No    Outpatient Encounter Prescriptions as of 01/04/2017  Medication Sig  . BIOTIN PO Take 5,000 mcg by mouth daily.   . Calcium Carbonate-Vitamin D (CALCIUM 600+D) 600-200 MG-UNIT TABS Take 1 tablet by mouth daily.  . citalopram (CELEXA) 40 MG tablet 40 mg daily.   . Cyanocobalamin (VITAMIN B-12 IJ) Inject as directed.  . diazepam (VALIUM) 5 MG tablet Take 0.5-1 tablets (2.5-5 mg total) by mouth every 12 (twelve) hours as needed for anxiety. (Patient taking differently: Take 2.5-5 mg by mouth every 12 (twelve) hours as needed for anxiety. )  . donepezil (ARICEPT) 5 MG tablet Take 5 mg by mouth at bedtime.  . fish oil-omega-3 fatty acids 1000 MG capsule Take 1 g by mouth daily.    . fluticasone furoate-vilanterol (BREO ELLIPTA) 200-25 MCG/INH AEPB Inhale 1 puff into the lungs daily.  . metoprolol tartrate (LOPRESSOR) 25 MG tablet Take 0.5 tablets (12.5 mg total) by mouth 2 (two) times daily.  . montelukast (SINGULAIR) 10 MG tablet Take  1 tablet (10 mg total) by mouth at bedtime.  . primidone (MYSOLINE) 50 MG tablet Take 50 mg by mouth at bedtime.  . rosuvastatin (CRESTOR) 10 MG tablet Take 1 tablet (10 mg total) by mouth daily. (Patient taking differently: Take 10 mg by mouth daily. )  . tolterodine (DETROL LA) 4 MG 24 hr capsule Take 4 mg by mouth daily.  . TRAZODONE HCL PO Take 250 mg by mouth at bedtime.  . vitamin B-12 (CYANOCOBALAMIN) 1000 MCG tablet Take 1,000 mcg by mouth daily.  . nitrofurantoin, macrocrystal-monohydrate, (MACROBID) 100 MG capsule Take 1 capsule (100 mg total) by mouth 2 (two) times daily. (Patient not taking: Reported on 01/04/2017)  . trimethoprim (TRIMPEX) 100 MG tablet Take 100 mg by mouth daily.    No facility-administered encounter medications on file as of 01/04/2017.     Activities of Daily Living In your present state of health, do you have any difficulty performing the  following activities: 01/04/2017  Hearing? Y  Vision? N  Difficulty concentrating or making decisions? Y  Walking or climbing stairs? N  Dressing or bathing? N  Doing errands, shopping? N  Preparing Food and eating ? N  Using the Toilet? N  In the past six months, have you accidently leaked urine? Y  Do you have problems with loss of bowel control? N  Managing your Medications? N  Managing your Finances? N  Housekeeping or managing your Housekeeping? N  Some recent data might be hidden    Patient Care Team: Jerrol Banana., MD as PCP - General (Unknown Physician Specialty)    Assessment:     Exercise Activities and Dietary recommendations Current Exercise Habits: The patient does not participate in regular exercise at present, Exercise limited by: None identified  Goals    . Exercise           Starting this year, I will start walking 2-3 days a week for 20 minutes.       Fall Risk Fall Risk  01/04/2017 08/31/2015  Falls in the past year? No No   Depression Screen PHQ 2/9 Scores 01/04/2017 08/31/2015  PHQ - 2 Score 0 0     Cognitive Function MMSE - Mini Mental State Exam 08/01/2016  Orientation to time 5  Orientation to Place 5  Registration 3  Attention/ Calculation 5  Recall 2  Language- name 2 objects 2  Language- repeat 1  Language- follow 3 step command 3  Language- read & follow direction 1  Write a sentence 1  Copy design 1  Total score 29        Immunization History  Administered Date(s) Administered  . Influenza Inj Mdck Quad Pf 10/27/2016  . Influenza, High Dose Seasonal PF 09/01/2015  . Influenza-Unspecified 10/12/2013, 08/12/2014, 09/04/2014  . Pneumococcal Conjugate-13 03/26/2015  . Pneumococcal Polysaccharide-23 10/11/1999, 04/10/2012  . Td 08/23/1999  . Zoster 09/26/2008   Screening Tests Health Maintenance  Topic Date Due  . Hepatitis C Screening  [birth year]  . COLONOSCOPY  01/03/2018  . MAMMOGRAM  02/14/2018  . TETANUS/TDAP   10/24/2022  . INFLUENZA VACCINE  Completed  . DEXA SCAN  Completed  . ZOSTAVAX  Completed  . PNA vac Low Risk Adult  Completed      Plan:  I have personally reviewed and addressed the Medicare Annual Wellness questionnaire and have noted the following in the patient's chart:  A. Medical and social history B. Use of alcohol, tobacco or illicit drugs  C. Current medications  and supplements D. Functional ability and status E.  Nutritional status F.  Physical activity G. Advance directives H. List of other physicians I.  Hospitalizations, surgeries, and ER visits in previous 12 months J.  [place] such as hearing and vision if needed, cognitive and depression L. Referrals and appointments - none  In addition, I have reviewed and discussed with patient certain preventive protocols, quality metrics, and best practice recommendations. A written personalized care plan for preventive services as well as general preventive health recommendations were provided to patient.  See attached scanned questionnaire for additional information.   Signed,  Fabio Neighbors, LPN Nurse Health Advisor   MD Recommendations: Follow up on Hepatitis C screening. Pt was like to discuss this with Tawanna Sat first.   I have reviewed the documentation and information obtained by Fabio Neighbors, LPN in the above chart and agree as above. I was available for consultation if any questions or issues arose.  Fenton Malling, PA-C

## 2017-01-04 NOTE — Progress Notes (Signed)
[name], Female    DOB: April 09, 1946, 71 y.o.   MRN: [id number] Visit Date: 01/04/2017  Today's Provider: Mar Daring, PA-C   Chief Complaint  Patient presents with  . Annual Exam   Subjective:    Annual physical exam Nancy Blackburn is a 71 y.o. female who presents today for health maintenance and complete physical. She feels well. She reports exercising none. She reports she is sleeping well.  -----------------------------------------------------------------   Review of Systems  Constitutional: Positive for activity change.  HENT: Positive for trouble swallowing.   Eyes: Negative.   Respiratory: Negative.   Cardiovascular: Negative.   Gastrointestinal: Negative.   Endocrine: Positive for cold intolerance.  Genitourinary: Positive for enuresis and frequency.  Musculoskeletal: Positive for arthralgias and back pain.  Skin: Negative.   Allergic/Immunologic: Negative.   Neurological: Positive for tremors.  Hematological: Negative.   Psychiatric/Behavioral: The patient is nervous/anxious.   Chronic issues  Social History      She  reports that she has never smoked. She has never used smokeless tobacco. She reports that she drinks alcohol. She reports that she does not use drugs.       Social History   Social History  . Marital status: Divorced    Spouse name: N/A  . Number of children: N/A  . Years of education: N/A   Social History Main Topics  . Smoking status: Never Smoker  . Smokeless tobacco: Never Used  . Alcohol use 0.0 oz/week     Comment: drinks 3-4 times a year  . Drug use: No  . Sexual activity: No   Other Topics Concern  . Not on file   Social History Narrative  . No narrative on file    Past Medical History:  Diagnosis Date  . Chronic sinus infection   . Ejection fraction    a. EF 55-65%, echo, mild LVH, June, 2013, small pericardial effusion  . Hyperlipidemia    a. takes Simvastatin  .  Hypertension    a. takes Metoprolol, Lisinopril, and amlodipine.  . Low back pain   . Mild dementia   . Nasal polyps   . Normal nuclear stress test    a. 07/2005 Nl nuc stress test.  . Palpitations    a. June 2013:  Event recorder normal sinus rhythm with rare PVCs - ? symptomatic PVC's.  . Parkinson disease ([place])   . Pericardial effusion    a. Small, echo, June, 2013  . PONV (postoperative nausea and vomiting)      Patient Active Problem List   Diagnosis Date Noted  . Cough, resolved 07/20/2016  . Chalazion, bilateral 06/08/2016  . Absolute anemia 07/30/2015  . BP (high blood pressure) 07/30/2015  . Edema extremities 07/30/2015  . Benign essential tremor 07/30/2015  . Anxiety, generalized 07/30/2015  . Gastro-esophageal reflux disease without esophagitis 07/30/2015  . HLD (hyperlipidemia) 07/30/2015  . Cannot sleep 07/30/2015  . Lumbar radiculopathy 07/30/2015  . Depression, major, recurrent, in partial remission ([place]) 07/30/2015  . Arthritis, degenerative 07/30/2015  . Allergic rhinitis 07/30/2015  . Gastroduodenal ulcer 07/30/2015  . Calcium blood increased 07/30/2015  . Pre-syncope 05/17/2015  . Dyspnea 03/31/2015  . Essential hypertension 03/31/2015  . Bleeding stomach ulcer 10/14/2013  . Arthritis 10/14/2013  . Anemia, iron deficiency 10/14/2013  . Depression 10/14/2013  . Weakness generalized 10/14/2013  . Ejection fraction   . Pericardial effusion   . PONV (postoperative nausea and vomiting)   . Hyperlipidemia   .  Palpitations   . Low back pain   . Normal nuclear stress test     Past Surgical History:  Procedure Laterality Date  . BACK SURGERY  08/2010  . BLADDER SUSPENSION  2003  . bone spur  2009   removed from right collar bone area and partial collar bone removed  . THYROIDECTOMY, PARTIAL    . TONSILLECTOMY     as child  . TUBAL LIGATION     10yrs ago  . VAGINAL HYSTERECTOMY     28yrs ago d/t cervical cancer    Family History        Family  Status  Relation Status  . Mother Alive  . Father Deceased  . Brother Alive  . Daughter Alive  . Neg Hx         Her family history includes Angina in her mother; Atrial fibrillation in her brother; COPD in her father; Cancer in her daughter; Colon cancer in her mother; Cushing syndrome in her daughter; Heart disease in her father and mother; Heart murmur in her mother.     Allergies  Allergen Reactions  . Anti-Inflammatory Enzyme [Nutritional Supplements] Other (See Comments)    REACTION: Due to bleeding ulcer  . Cymbalta [Duloxetine Hcl] Other (See Comments)    REACTION: Urinary retention  . Clarithromycin Other (See Comments)    Upsets stomach  . Clarithromycin     GI upset  . Nsaids     GI BLEED  . Ranitidine     severe diarrhea  . Venlafaxine     severe depression     Current Outpatient Prescriptions:  .  BIOTIN PO, Take 5,000 mcg by mouth daily. , Disp: , Rfl:  .  Calcium Carbonate-Vitamin D (CALCIUM 600+D) 600-200 MG-UNIT TABS, Take 1 tablet by mouth daily., Disp: , Rfl:  .  citalopram (CELEXA) 40 MG tablet, 40 mg daily. , Disp: , Rfl:  .  Cyanocobalamin (VITAMIN B-12 IJ), Inject as directed., Disp: , Rfl:  .  diazepam (VALIUM) 5 MG tablet, Take 0.5-1 tablets (2.5-5 mg total) by mouth every 12 (twelve) hours as needed for anxiety. (Patient taking differently: Take 2.5-5 mg by mouth every 12 (twelve) hours as needed for anxiety. ), Disp: 30 tablet, Rfl: 2 .  donepezil (ARICEPT) 5 MG tablet, Take 5 mg by mouth at bedtime., Disp: , Rfl:  .  fish oil-omega-3 fatty acids 1000 MG capsule, Take 1 g by mouth daily.  , Disp: , Rfl:  .  fluticasone furoate-vilanterol (BREO ELLIPTA) 200-25 MCG/INH AEPB, Inhale 1 puff into the lungs daily., Disp: 180 each, Rfl: 2 .  metoprolol tartrate (LOPRESSOR) 25 MG tablet, Take 0.5 tablets (12.5 mg total) by mouth 2 (two) times daily., Disp: 60 tablet, Rfl: 12 .  montelukast (SINGULAIR) 10 MG tablet, Take 1 tablet (10 mg total) by mouth at  bedtime., Disp: 90 tablet, Rfl: 3 .  nitrofurantoin, macrocrystal-monohydrate, (MACROBID) 100 MG capsule, Take 1 capsule (100 mg total) by mouth 2 (two) times daily. (Patient not taking: Reported on 01/04/2017), Disp: 6 capsule, Rfl: 2 .  primidone (MYSOLINE) 50 MG tablet, Take 50 mg by mouth at bedtime., Disp: , Rfl:  .  rosuvastatin (CRESTOR) 10 MG tablet, Take 1 tablet (10 mg total) by mouth daily. (Patient taking differently: Take 10 mg by mouth daily. ), Disp: 90 tablet, Rfl: 3 .  tolterodine (DETROL LA) 4 MG 24 hr capsule, Take 4 mg by mouth daily., Disp: , Rfl:  .  TRAZODONE HCL PO, Take 250  mg by mouth at bedtime., Disp: , Rfl:  .  trimethoprim (TRIMPEX) 100 MG tablet, Take 100 mg by mouth daily. , Disp: , Rfl:  .  vitamin B-12 (CYANOCOBALAMIN) 1000 MCG tablet, Take 1,000 mcg by mouth daily., Disp: , Rfl:    Patient Care Team: Jerrol Banana., MD as PCP - General (Unknown Physician Specialty) Mar Daring, PA-C as Physician Assistant (Family Medicine) Birder Robson, MD as Referring Physician (Ophthalmology) Vladimir Crofts, MD as Consulting Physician (Neurology) Carolan Clines, MD as Consulting Physician (Urology) Wilhelmina Mcardle, MD as Consulting Physician (Pulmonary Disease)      Objective:   Vitals: BP 108/70 (BP Location: Right Arm, Patient Position: Sitting)   Pulse 72   Temp 98.7 F (37.1 C) (Oral)   Resp 16   Wt 180 lb 12.8 oz (82 kg)   BMI 28.53 kg/m    Physical Exam  Constitutional: She is oriented to person, place, and time. She appears well-developed and well-nourished. No distress.  HENT:  Head: Normocephalic and atraumatic.  Right Ear: External ear normal.  Left Ear: External ear normal.  Nose: Nose normal.  Mouth/Throat: Oropharynx is clear and moist. No oropharyngeal exudate.  Eyes: Conjunctivae and EOM are normal. Pupils are equal, round, and reactive to light. Right eye exhibits no discharge. Left eye exhibits no discharge. No scleral  icterus.  Neck: Normal range of motion. Neck supple. No JVD present. No tracheal deviation present. No thyromegaly present.  Cardiovascular: Normal rate, regular rhythm, normal heart sounds and intact distal pulses.  Exam reveals no gallop and no friction rub.   No murmur heard. Pulmonary/Chest: Effort normal and breath sounds normal. No respiratory distress. She has no wheezes. She has no rales. She exhibits no tenderness. Right breast exhibits no inverted nipple, no mass, no nipple discharge, no skin change and no tenderness. Left breast exhibits no inverted nipple, no mass, no nipple discharge, no skin change and no tenderness. Breasts are symmetrical.  Abdominal: Soft. Bowel sounds are normal. She exhibits no distension and no mass. There is no tenderness. There is no rebound and no guarding.  Musculoskeletal: Normal range of motion. She exhibits no edema or tenderness.  Lymphadenopathy:    She has no cervical adenopathy.  Neurological: She is alert and oriented to person, place, and time.  Skin: Skin is warm and dry. No rash noted. She is not diaphoretic.  Psychiatric: She has a normal mood and affect. Her behavior is normal. Judgment and thought content normal.  Vitals reviewed.    Depression Screen PHQ 2/9 Scores 01/04/2017 08/31/2015  PHQ - 2 Score 0 0      Assessment & Plan:     Routine Health Maintenance and Physical Exam  Exercise Activities and Dietary recommendations Goals    . Exercise           Starting this year, I will start walking 2-3 days a week for 20 minutes.        Immunization History  Administered Date(s) Administered  . Influenza Inj Mdck Quad Pf 10/27/2016  . Influenza, High Dose Seasonal PF 09/01/2015  . Influenza-Unspecified 10/12/2013, 08/12/2014, 09/04/2014  . Pneumococcal Conjugate-13 03/26/2015  . Pneumococcal Polysaccharide-23 10/11/1999, 04/10/2012  . Td 08/23/1999  . Zoster 09/26/2008    Health Maintenance  Topic Date Due  .  Hepatitis C Screening  [birth year]  . COLONOSCOPY  01/03/2018  . MAMMOGRAM  02/14/2018  . TETANUS/TDAP  10/24/2022  . INFLUENZA VACCINE  Completed  . DEXA SCAN  Completed  . ZOSTAVAX  Completed  . PNA vac Low Risk Adult  Completed     Discussed health benefits of physical activity, and encouraged her to engage in regular exercise appropriate for her age and condition.    1. Annual physical exam Normal physical exam today. Will check labs as below and f/u pending lab results. If labs are stable and WNL she will not need to have these rechecked for one year at her next annual physical exam. She is to call the office in the meantime if she has any acute issue, questions or concerns.  2. Essential hypertension Stable. Continue current medical treatment plan. Will check labs as below and f/u pending results. - CBC w/Diff/Platelet - Comprehensive metabolic panel  3. Iron deficiency anemia, unspecified iron deficiency anemia type Stable. Will check labs as below and f/u pending results. - CBC w/Diff/Platelet  4. Pure hypercholesterolemia Stable. Will check labs as below and f/u pending results. - Comprehensive metabolic panel - Lipid panel  5. Need for hepatitis C screening test - Hepatitis C antibody screen  --------------------------------------------------------------------    Mar Daring, PA-C  [facility]

## 2017-01-04 NOTE — Patient Instructions (Signed)
Health Maintenance, Female Introduction Adopting a healthy lifestyle and getting preventive care can go a long way to promote health and wellness. Talk with your health care provider about what schedule of regular examinations is right for you. This is a good chance for you to check in with your provider about disease prevention and staying healthy. In between checkups, there are plenty of things you can do on your own. Experts have done a lot of research about which lifestyle changes and preventive measures are most likely to keep you healthy. Ask your health care provider for more information. Weight and diet Eat a healthy diet  Be sure to include plenty of vegetables, fruits, low-fat dairy products, and lean protein.  Do not eat a lot of foods high in solid fats, added sugars, or salt.  Get regular exercise. This is one of the most important things you can do for your health.  Most adults should exercise for at least 150 minutes each week. The exercise should increase your heart rate and make you sweat (moderate-intensity exercise).  Most adults should also do strengthening exercises at least twice a week. This is in addition to the moderate-intensity exercise. Maintain a healthy weight  Body mass index (BMI) is a measurement that can be used to identify possible weight problems. It estimates body fat based on height and weight. Your health care provider can help determine your BMI and help you achieve or maintain a healthy weight.  For females 4 years of age and older:  A BMI below 18.5 is considered underweight.  A BMI of 18.5 to 24.9 is normal.  A BMI of 25 to 29.9 is considered overweight.  A BMI of 30 and above is considered obese. Watch levels of cholesterol and blood lipids  You should start having your blood tested for lipids and cholesterol at 72 years of age, then have this test every 5 years.  You may need to have your cholesterol levels checked more often  if:  Your lipid or cholesterol levels are high.  You are older than 72 years of age.  You are at high risk for heart disease. Cancer screening Lung Cancer  Lung cancer screening is recommended for adults 72-31 years old who are at high risk for lung cancer because of a history of smoking.  A yearly low-dose CT scan of the lungs is recommended for people who:  Currently smoke.  Have quit within the past 15 years.  Have at least a 30-pack-year history of smoking. A pack year is smoking an average of one pack of cigarettes a day for 1 year.  Yearly screening should continue until it has been 15 years since you quit.  Yearly screening should stop if you develop a health problem that would prevent you from having lung cancer treatment. Breast Cancer  Practice breast self-awareness. This means understanding how your breasts normally appear and feel.  It also means doing regular breast self-exams. Let your health care provider know about any changes, no matter how small.  If you are in your 72s or 30s, you should have a clinical breast exam (CBE) by a health care provider every 1-3 years as part of a regular health exam.  If you are 72 or older, have a CBE every year. Also consider having a breast X-ray (mammogram) every year.  If you have a family history of breast cancer, talk to your health care provider about genetic screening.  If you are at high risk for breast cancer,  talk to your health care provider about having an MRI and a mammogram every year.  Breast cancer gene (BRCA) assessment is recommended for women who have family members with BRCA-related cancers. BRCA-related cancers include:  Breast.  Ovarian.  Tubal.  Peritoneal cancers.  Results of the assessment will determine the need for genetic counseling and BRCA1 and BRCA2 testing. Colorectal Cancer  This type of cancer can be detected and often prevented.  Routine colorectal cancer screening usually begins  at 72 years of age and continues through 72 years of age.  Your health care provider may recommend screening at an earlier age if you have risk factors for colon cancer.  Your health care provider may also recommend using home test kits to check for hidden blood in the stool.  A small camera at the end of a tube can be used to examine your colon directly (sigmoidoscopy or colonoscopy). This is done to check for the earliest forms of colorectal cancer.  Routine screening usually begins at age 50.  Direct examination of the colon should be repeated every 5-10 years through 72 years of age. However, you may need to be screened more often if early forms of precancerous polyps or small growths are found. Skin Cancer  Check your skin from head to toe regularly.  Tell your health care provider about any new moles or changes in moles, especially if there is a change in a mole's shape or color.  Also tell your health care provider if you have a mole that is larger than the size of a pencil eraser.  Always use sunscreen. Apply sunscreen liberally and repeatedly throughout the day.  Protect yourself by wearing long sleeves, pants, a wide-brimmed hat, and sunglasses whenever you are outside. Heart disease, diabetes, and high blood pressure  High blood pressure causes heart disease and increases the risk of stroke. High blood pressure is more likely to develop in:  People who have blood pressure in the high end of the normal range (130-139/85-89 mm Hg).  People who are overweight or obese.  People who are African American.  If you are 18-39 years of age, have your blood pressure checked every 3-5 years. If you are 40 years of age or older, have your blood pressure checked every year. You should have your blood pressure measured twice-once when you are at a hospital or clinic, and once when you are not at a hospital or clinic. Record the average of the two measurements. To check your blood pressure  when you are not at a hospital or clinic, you can use:  An automated blood pressure machine at a pharmacy.  A home blood pressure monitor.  If you are between 55 years and 79 years old, ask your health care provider if you should take aspirin to prevent strokes.  Have regular diabetes screenings. This involves taking a blood sample to check your fasting blood sugar level.  If you are at a normal weight and have a low risk for diabetes, have this test once every three years after 72 years of age.  If you are overweight and have a high risk for diabetes, consider being tested at a younger age or more often. Preventing infection Hepatitis B  If you have a higher risk for hepatitis B, you should be screened for this virus. You are considered at high risk for hepatitis B if:  You were born in a country where hepatitis B is common. Ask your health care provider which countries are   considered high risk.  Your parents were born in a high-risk country, and you have not been immunized against hepatitis B (hepatitis B vaccine).  You have HIV or AIDS.  You use needles to inject street drugs.  You live with someone who has hepatitis B.  You have had sex with someone who has hepatitis B.  You get hemodialysis treatment.  You take certain medicines for conditions, including cancer, organ transplantation, and autoimmune conditions. Hepatitis C  Blood testing is recommended for:  Everyone born from [birth year] through 1966.  Anyone with known risk factors for hepatitis C. Sexually transmitted infections (STIs)  You should be screened for sexually transmitted infections (STIs) including gonorrhea and chlamydia if:  You are sexually active and are younger than 72 years of age.  You are older than 72 years of age and your health care provider tells you that you are at risk for this type of infection.  Your sexual activity has changed since you were last screened and you are at an increased risk  for chlamydia or gonorrhea. Ask your health care provider if you are at risk.  If you do not have HIV, but are at risk, it may be recommended that you take a prescription medicine daily to prevent HIV infection. This is called pre-exposure prophylaxis (PrEP). You are considered at risk if:  You are sexually active and do not regularly use condoms or know the HIV status of your partner(s).  You take drugs by injection.  You are sexually active with a partner who has HIV. Talk with your health care provider about whether you are at high risk of being infected with HIV. If you choose to begin PrEP, you should first be tested for HIV. You should then be tested every 3 months for as long as you are taking PrEP. Follow these instructions at home:  Schedule regular health, dental, and eye exams.  Stay current with your immunizations.  Do not use any tobacco products including cigarettes, chewing tobacco, or electronic cigarettes.  If you are pregnant, do not drink alcohol.  If you are breastfeeding, limit how much and how often you drink alcohol.  Limit alcohol intake to no more than 1 drink per day for nonpregnant women. One drink equals 12 ounces of beer, 5 ounces of wine, or 1 ounces of hard liquor.  Do not use street drugs.  Do not share needles.  Ask your health care provider for help if you need support or information about quitting drugs.  Tell your health care provider if you often feel depressed.  Tell your health care provider if you have ever been abused or do not feel safe at home. This information is not intended to replace advice given to you by your health care provider. Make sure you discuss any questions you have with your health care provider. Document Released: 06/13/2011 Document Revised: 05/05/2016 Document Reviewed: 09/01/2015  2017 [employer]

## 2017-01-04 NOTE — Patient Instructions (Signed)

## 2017-01-09 DIAGNOSIS — D509 Iron deficiency anemia, unspecified: Secondary | ICD-10-CM | POA: Diagnosis not present

## 2017-01-09 DIAGNOSIS — E7800 Pure hypercholesterolemia, unspecified: Secondary | ICD-10-CM | POA: Diagnosis not present

## 2017-01-09 DIAGNOSIS — I10 Essential (primary) hypertension: Secondary | ICD-10-CM | POA: Diagnosis not present

## 2017-01-09 DIAGNOSIS — Z1159 Encounter for screening for other viral diseases: Secondary | ICD-10-CM | POA: Diagnosis not present

## 2017-01-10 DIAGNOSIS — R3915 Urgency of urination: Secondary | ICD-10-CM | POA: Diagnosis not present

## 2017-01-10 LAB — CBC WITH DIFFERENTIAL/PLATELET
Basophils Absolute: 0.1 x10E3/uL (ref 0.0–0.2)
Basos: 1 %
EOS (ABSOLUTE): 0.1 x10E3/uL (ref 0.0–0.4)
Eos: 2 %
Hematocrit: 41.1 % (ref 34.0–46.6)
Hemoglobin: 13.6 g/dL (ref 11.1–15.9)
Immature Grans (Abs): 0 x10E3/uL (ref 0.0–0.1)
Immature Granulocytes: 0 %
Lymphocytes Absolute: 2 x10E3/uL (ref 0.7–3.1)
Lymphs: 33 %
MCH: 29.1 pg (ref 26.6–33.0)
MCHC: 33.1 g/dL (ref 31.5–35.7)
MCV: 88 fL (ref 79–97)
Monocytes Absolute: 0.8 x10E3/uL (ref 0.1–0.9)
Monocytes: 12 %
Neutrophils Absolute: 3.2 x10E3/uL (ref 1.4–7.0)
Neutrophils: 52 %
Platelets: 276 x10E3/uL (ref 150–379)
RBC: 4.67 x10E6/uL (ref 3.77–5.28)
RDW: 12.5 % (ref 12.3–15.4)
WBC: 6.2 x10E3/uL (ref 3.4–10.8)

## 2017-01-10 LAB — LIPID PANEL
Chol/HDL Ratio: 3.7 ratio (ref 0.0–4.4)
Cholesterol, Total: 191 mg/dL (ref 100–199)
HDL: 52 mg/dL
LDL Calculated: 111 mg/dL — ABNORMAL HIGH (ref 0–99)
Triglycerides: 141 mg/dL (ref 0–149)
VLDL Cholesterol Cal: 28 mg/dL (ref 5–40)

## 2017-01-10 LAB — COMPREHENSIVE METABOLIC PANEL WITH GFR
ALT: 13 IU/L (ref 0–32)
AST: 17 IU/L (ref 0–40)
Albumin/Globulin Ratio: 1.7 (ref 1.2–2.2)
Albumin: 3.7 g/dL (ref 3.5–4.8)
Alkaline Phosphatase: 94 IU/L (ref 39–117)
BUN/Creatinine Ratio: 11 — ABNORMAL LOW (ref 12–28)
BUN: 11 mg/dL (ref 8–27)
Bilirubin Total: 0.3 mg/dL (ref 0.0–1.2)
CO2: 26 mmol/L (ref 18–29)
Calcium: 9.2 mg/dL (ref 8.7–10.3)
Chloride: 102 mmol/L (ref 96–106)
Creatinine, Ser: 1 mg/dL (ref 0.57–1.00)
GFR calc Af Amer: 65 mL/min/1.73
GFR calc non Af Amer: 57 mL/min/1.73 — ABNORMAL LOW
Globulin, Total: 2.2 g/dL (ref 1.5–4.5)
Glucose: 103 mg/dL — ABNORMAL HIGH (ref 65–99)
Potassium: 4.4 mmol/L (ref 3.5–5.2)
Sodium: 140 mmol/L (ref 134–144)
Total Protein: 5.9 g/dL — ABNORMAL LOW (ref 6.0–8.5)

## 2017-01-10 LAB — HEPATITIS C ANTIBODY: Hep C Virus Ab: <0.1 s/co ratio (ref 0.0–0.9)

## 2017-01-11 DIAGNOSIS — E538 Deficiency of other specified B group vitamins: Secondary | ICD-10-CM | POA: Diagnosis not present

## 2017-01-11 NOTE — Progress Notes (Signed)
Patient advised.

## 2017-01-12 ENCOUNTER — Ambulatory Visit (INDEPENDENT_AMBULATORY_CARE_PROVIDER_SITE_OTHER): Payer: Medicare Other | Admitting: Family Medicine

## 2017-01-12 ENCOUNTER — Encounter: Payer: Self-pay | Admitting: Family Medicine

## 2017-01-12 VITALS — BP 110/70 | HR 76 | Temp 98.3°F | Resp 16 | Wt 184.0 lb

## 2017-01-12 DIAGNOSIS — E538 Deficiency of other specified B group vitamins: Secondary | ICD-10-CM

## 2017-01-12 DIAGNOSIS — Z8349 Family history of other endocrine, nutritional and metabolic diseases: Secondary | ICD-10-CM

## 2017-01-12 DIAGNOSIS — R259 Unspecified abnormal involuntary movements: Secondary | ICD-10-CM | POA: Diagnosis not present

## 2017-01-12 DIAGNOSIS — G3184 Mild cognitive impairment, so stated: Secondary | ICD-10-CM

## 2017-01-12 DIAGNOSIS — G250 Essential tremor: Secondary | ICD-10-CM | POA: Diagnosis not present

## 2017-01-12 NOTE — Progress Notes (Signed)
[name] Female    DOB: 05-13-1946   71 y.o.   MRN: [id number] Visit Date: 01/12/2017  Today's Provider: Wilhemena Durie, MD   Chief Complaint  Patient presents with  . Genetic Evaluation  . Parkinsons  . Dementia   Subjective:    HPI   Pt saw Neurology on 12/16/2016. Pt was diagnosed with mild cognitive impairment (question early alzheimers), as well as parkinson's. Pt was started on Aricept 5 mg daily for MCI. Pt also started Primidone 25 mg po qhs. Pt reports good compliance with the treatment. Pt reports her tremors are improved. Pt was also B12 deficient, and has been tasking B12 injections weekly, as well as oral B12 daily. Pt is feeling significantly better with B12.  Genetic Evaluation Pt's daughter was diagnosed with Cushing's about 1 year ago. Daughter is being F/B [facility] for this. [facility] is requesting pt be tested for Cushing's. Daughter also had thyroid cancer, with a H/O DM.  Allergies  Allergen Reactions  . Anti-Inflammatory Enzyme [Nutritional Supplements] Other (See Comments)    REACTION: Due to bleeding ulcer  . Cymbalta [Duloxetine Hcl] Other (See Comments)    REACTION: Urinary retention  . Clarithromycin Other (See Comments)    Upsets stomach  . Clarithromycin     GI upset  . Nsaids     GI BLEED  . Ranitidine     severe diarrhea  . Venlafaxine     severe depression     Current Outpatient Prescriptions:  .  BIOTIN PO, Take 5,000 mcg by mouth daily. , Disp: , Rfl:  .  Calcium Carbonate-Vitamin D (CALCIUM 600+D) 600-200 MG-UNIT TABS, Take 1 tablet by mouth daily., Disp: , Rfl:  .  citalopram (CELEXA) 40 MG tablet, 40 mg daily. , Disp: , Rfl:  .  Cyanocobalamin (VITAMIN B-12 IJ), Inject as directed., Disp: , Rfl:  .  diazepam (VALIUM) 5 MG tablet, Take 0.5-1 tablets (2.5-5 mg total) by mouth every 12 (twelve) hours as needed for anxiety. (Patient taking differently: Take 2.5-5 mg by mouth every 12 (twelve) hours as needed for  anxiety. ), Disp: 30 tablet, Rfl: 2 .  donepezil (ARICEPT) 5 MG tablet, Take 5 mg by mouth at bedtime., Disp: , Rfl:  .  fish oil-omega-3 fatty acids 1000 MG capsule, Take 1 g by mouth daily.  , Disp: , Rfl:  .  fluticasone furoate-vilanterol (BREO ELLIPTA) 200-25 MCG/INH AEPB, Inhale 1 puff into the lungs daily., Disp: 180 each, Rfl: 2 .  metoprolol tartrate (LOPRESSOR) 25 MG tablet, Take 0.5 tablets (12.5 mg total) by mouth 2 (two) times daily., Disp: 60 tablet, Rfl: 12 .  montelukast (SINGULAIR) 10 MG tablet, Take 1 tablet (10 mg total) by mouth at bedtime., Disp: 90 tablet, Rfl: 3 .  nitrofurantoin, macrocrystal-monohydrate, (MACROBID) 100 MG capsule, Take 1 capsule (100 mg total) by mouth 2 (two) times daily., Disp: 6 capsule, Rfl: 2 .  primidone (MYSOLINE) 50 MG tablet, Take 50 mg by mouth at bedtime., Disp: , Rfl:  .  rosuvastatin (CRESTOR) 10 MG tablet, Take 1 tablet (10 mg total) by mouth daily. (Patient taking differently: Take 10 mg by mouth daily. ), Disp: 90 tablet, Rfl: 3 .  tolterodine (DETROL LA) 4 MG 24 hr capsule, Take 4 mg by mouth daily., Disp: , Rfl:  .  TRAZODONE HCL PO, Take 250 mg by mouth at bedtime., Disp: , Rfl:  .  trimethoprim (TRIMPEX) 100 MG tablet, Take 100 mg by  mouth daily. , Disp: , Rfl:  .  vitamin B-12 (CYANOCOBALAMIN) 1000 MCG tablet, Take 1,000 mcg by mouth daily., Disp: , Rfl:   Review of Systems  Constitutional: Negative for activity change, appetite change, chills, diaphoresis, fatigue, fever and unexpected weight change.  Neurological: Positive for tremors.    Social History  Substance Use Topics  . Smoking status: Never Smoker  . Smokeless tobacco: Never Used  . Alcohol use 0.0 oz/week     Comment: drinks 3-4 times a year   Objective:   BP 110/70 (BP Location: Right Arm, Patient Position: Sitting, Cuff Size: Normal)   Pulse 76   Temp 98.3 F (36.8 C) (Oral)   Resp 16   Wt 184 lb (83.5 kg)   BMI 29.03 kg/m   Physical Exam    Constitutional: She appears well-developed and well-nourished.  HENT:  Head: Normocephalic.  Eyes: Conjunctivae are normal.  Neck: Normal range of motion.  Cardiovascular: Normal rate, regular rhythm and normal heart sounds.   Pulmonary/Chest: Effort normal and breath sounds normal. No respiratory distress.  Psychiatric: She has a normal mood and affect. Her behavior is normal.        Assessment & Plan:     1. Mild cognitive impairment with memory loss FU with Neuro as scheduled.  2. Parkinsonian features FU with Neuro as scheduled.  3. Family history of Cushing disease Will refer to [facility] for further workup. Daughter possibly has Neuroendocrine abnormality. - Ambulatory referral to Endocrinology  4. Benign essential tremor Advised pt to take diazepam PRN.  5. B12 deficiency Continue to FU with neurology, and continue medications.   she is feeling much better since having this diagnosed and treated. 6.Depression and Anxiety Improved. Patient seen and examined by Miguel Aschoff, MD, and note scribed by Renaldo Fiddler, CMA. I have done the exam and reviewed the above chart and it is accurate to the best of my knowledge. Development worker, community has been used in this note in any air is in the dictation or transcription are unintentional.  Wilhemena Durie, MD  [place]

## 2017-01-13 ENCOUNTER — Encounter: Payer: Self-pay | Admitting: Family Medicine

## 2017-01-13 ENCOUNTER — Telehealth: Payer: Self-pay | Admitting: Family Medicine

## 2017-01-13 DIAGNOSIS — M170 Bilateral primary osteoarthritis of knee: Secondary | ICD-10-CM | POA: Diagnosis not present

## 2017-01-13 NOTE — Telephone Encounter (Signed)
Called about Nancy Blackburn Referral [phone] pls take a message and I will call her back to confirm email, pharmacy, pricing, etc. - Jill Alexanders

## 2017-01-17 DIAGNOSIS — F331 Major depressive disorder, recurrent, moderate: Secondary | ICD-10-CM | POA: Diagnosis not present

## 2017-01-17 DIAGNOSIS — F5105 Insomnia due to other mental disorder: Secondary | ICD-10-CM | POA: Diagnosis not present

## 2017-01-31 ENCOUNTER — Encounter: Payer: Self-pay | Admitting: Family Medicine

## 2017-02-07 DIAGNOSIS — R32 Unspecified urinary incontinence: Secondary | ICD-10-CM | POA: Diagnosis not present

## 2017-02-09 DIAGNOSIS — E538 Deficiency of other specified B group vitamins: Secondary | ICD-10-CM | POA: Diagnosis not present

## 2017-03-13 ENCOUNTER — Ambulatory Visit: Payer: Medicare Other | Attending: Neurology | Admitting: Physical Therapy

## 2017-03-13 ENCOUNTER — Encounter: Payer: Self-pay | Admitting: Physical Therapy

## 2017-03-13 DIAGNOSIS — R262 Difficulty in walking, not elsewhere classified: Secondary | ICD-10-CM | POA: Diagnosis not present

## 2017-03-13 DIAGNOSIS — M6281 Muscle weakness (generalized): Secondary | ICD-10-CM | POA: Diagnosis not present

## 2017-03-13 DIAGNOSIS — E538 Deficiency of other specified B group vitamins: Secondary | ICD-10-CM | POA: Diagnosis not present

## 2017-03-13 NOTE — Therapy (Addendum)
[place] MAIN [facility] SERVICES [address] [place], [place], [address] Phone: [phone]   Fax:  [phone]  Physical Therapy Evaluation  Patient Details  Name: Nancy Blackburn MRN: [id number] Date of Birth: 10-01-1946 Referring Provider: Jennings Books K  Encounter Date: 03/13/2017      PT End of Session - 03/13/17 1530    Visit Number 1   Number of Visits 17   Date for PT Re-Evaluation 05/17/17   Authorization Type g codes 1/10   PT Start Time 0300   PT Stop Time 0355   PT Time Calculation (min) 55 min   Equipment Utilized During Treatment Gait belt   Activity Tolerance Patient tolerated treatment well;Patient limited by fatigue      Past Medical History:  Diagnosis Date  . Chronic sinus infection   . Ejection fraction    a. EF 55-65%, echo, mild LVH, June, 2013, small pericardial effusion  . Hyperlipidemia    a. takes Simvastatin  . Hypertension    a. takes Metoprolol, Lisinopril, and amlodipine.  . Low back pain   . Mild dementia   . Nasal polyps   . Normal nuclear stress test    a. 07/2005 Nl nuc stress test.  . Palpitations    a. June 2013:  Event recorder normal sinus rhythm with rare PVCs - ? symptomatic PVC's.  . Parkinson disease ([place])   . Pericardial effusion    a. Small, echo, June, 2013  . PONV (postoperative nausea and vomiting)     Past Surgical History:  Procedure Laterality Date  . BACK SURGERY  08/2010  . BLADDER SUSPENSION  2003  . bone spur  2009   removed from right collar bone area and partial collar bone removed  . THYROIDECTOMY, PARTIAL    . TONSILLECTOMY     as child  . TUBAL LIGATION     78yrs ago  . VAGINAL HYSTERECTOMY     89yrs ago d/t cervical cancer    There were no vitals filed for this visit.       Subjective Assessment - 03/13/17 1459    Subjective Patient reports that she is shaking and is having trouble with walking.   Pertinent History Patient was Dx 6 months ago and  she feels that she is walking slower at home. She is having tremors with right side > left side She continues to be active and has been a little slower the past few days. She had B 12 shots 3 monts ago and now is feeling better.    How long can you sit comfortably? unlimited   How long can you stand comfortably? 30 minutes   How long can you walk comfortably? 30 minutes   Diagnostic tests MRI   Patient Stated Goals to walk better   Currently in Pain? Yes   Pain Score 3    Pain Location Knee   Pain Orientation Right;Left   Pain Descriptors / Indicators Aching   Pain Type Chronic pain   Pain Radiating Towards NA   Pain Frequency Intermittent   Aggravating Factors  walking   Pain Relieving Factors shots   Multiple Pain Sites --  back pain 0/10             [facility] PT Assessment - 03/13/17 1505      Assessment   Medical Diagnosis parkinsons disease   Referring Provider [facility] K   Onset Date/Surgical Date 12/26/16   Hand Dominance Right   Next  MD Visit --  03/20/17   Prior Therapy --  no     Balance Screen   Has the patient fallen in the past 6 months No   Has the patient had a decrease in activity level because of a fear of falling?  Yes   Is the patient reluctant to leave their home because of a fear of falling?  No     Home Environment   Living Environment Private residence   Living Arrangements Other relatives   Available Help at Discharge Family   Type of [place] to enter   Entrance Stairs-Number of Steps --  6   Entrance Stairs-Rails Right;Left;Cannot reach both   [place] One level   [place] - 2 wheels;Grab bars - tub/shower     Prior Function   Level of Independence Independent   Vocation Requirements --  retired   Leisure movies, out to eat, read        PAIN: knee 3/10, back pain 2/10 and is intermittent   POSTURE: WNL   PROM/AROM:WFL BUE and BLE  Tone: tremor in RUE that is worse with AROM   STRENGTH:   Graded on a 0-5 scale Muscle Group Left Right                          Hip Flex 3/5 3/5  Hip Abd 3/5 3/5  Hip Add 2/5 2/5  Hip Ext 2/5 2/5  Hip IR/ER    Knee Flex 4/5 4/5  Knee Ext 4/5 4/5  Ankle DF -5/5 -4/5  Ankle PF 4/5 4/5   SENSATION: WNL   FUNCTIONAL MOBILITY: WNL   BALANCE:unable to tandem stand , unable to single leg stand   GAIT: Ambulates with decreased gait speed and decreased R arm swing  OUTCOME MEASURES: TEST Outcome Interpretation  5 times sit<>stand 19.18sec >60 yo, >15 sec indicates increased risk for falls  10 meter walk test   1.20              m/s <1.0 m/s indicates increased risk for falls; limited community ambulator  Timed up and Go   11.56              sec <14 sec indicates increased risk for falls  6 minute walk test  950              Feet 1000 feet is community ambulator  BERG 48/56 56/56 is normal                              PT Education - 03/13/17 1530    Education provided Yes   Education Details plan of care   Person(s) Educated Patient   Methods Explanation             PT Long Term Goals - 03/14/17 0939      PT LONG TERM GOAL #1   Title Patient will be independent in home exercise program to improve strength/mobility for better functional independence with ADLs.   Time 4   Period Weeks   Status New     PT LONG TERM GOAL #2   Title Patient (> 88 years old) will complete five times sit to stand test in < 15 seconds indicating an increased LE strength and improved balance   Time 4   Period Weeks   Status New  PT LONG TERM GOAL #3   Title Patient will increase six minute walk test distance to >1000 for progression to community ambulator and improve gait ability   Time 4   Period Weeks   Status New     PT LONG TERM GOAL #4   Title Patient will reduce timed up and go to <11 seconds to reduce fall risk and demonstrate improved transfer/gait ability.   Time 4   Period Weeks   Status New     PT  LONG TERM GOAL #5   Title Patient will ascend/descend 4 stairs without rail assist independently without loss of balance to improve ability to get in/out of home.    Time 4   Period Weeks   Status New               Plan - 03-25-17 1513    Clinical Impression Statement Pateint is 17 yr. old female with Dx of parkinsons disease. She has decreased gait with decreased right arm swing, slow gait speed, She has weakness in BLE , decreased standing dynamic balance with outcome measures that indicate a falls risk, and has difficutly with  household chores. She will benefit from skilled PT to improve strength, mobility and falls risk.    Rehab Potential Good   Clinical Impairments Affecting Rehab Potential This patient presents with 2, personal factors/ comorbidities she lives alone and current situtation, and 3  body elements including body structures and functions, activity limitations and or participation restrictions including decreased gait and decreased standing balance, falls risk. Patient's condition is  evolving   PT Frequency 4x / week   PT Duration 4 weeks   PT Treatment/Interventions Gait training;Neuromuscular re-education;Balance training;Therapeutic exercise;Therapeutic activities;Patient/family education   PT Next Visit Plan LSVT BIG   PT Home Exercise Plan LSVT BIG   Consulted and Agree with Plan of Care Patient      Patient will benefit from skilled therapeutic intervention in order to improve the following deficits and impairments:  Abnormal gait, Decreased balance, Decreased endurance, Decreased mobility, Difficulty walking, Decreased activity tolerance, Decreased safety awareness, Decreased strength, Pain, Decreased cognition  Visit Diagnosis: Difficulty in walking, not elsewhere classified  Muscle weakness (generalized)      G-Codes - 2017/03/25 1536    Functional Assessment Tool Used (Outpatient Only) Tug, 10 MW, 6 MW, BERG, 5 x sit to stand   Functional Limitation  Mobility: Walking and moving around   Mobility: Walking and Moving Around Current Status [phone]) At least 40 percent but less than 60 percent impaired, limited or restricted   Mobility: Walking and Moving Around Goal Status [phone]) At least 20 percent but less than 40 percent impaired, limited or restricted       Problem List Patient Active Problem List   Diagnosis Date Noted  . Family history of Cushing disease 01/12/2017  . B12 deficiency 12/18/2016  . Mild cognitive impairment with memory loss 12/18/2016  . Parkinsonian features 12/18/2016  . Vitamin D deficiency 12/18/2016  . Chalazion, bilateral 06/08/2016  . Absolute anemia 07/30/2015  . Edema extremities 07/30/2015  . Benign essential tremor 07/30/2015  . Anxiety, generalized 07/30/2015  . Gastro-esophageal reflux disease without esophagitis 07/30/2015  . HLD (hyperlipidemia) 07/30/2015  . Cannot sleep 07/30/2015  . Lumbar radiculopathy 07/30/2015  . Depression, major, recurrent, in partial remission ([place]) 07/30/2015  . Arthritis, degenerative 07/30/2015  . Allergic rhinitis 07/30/2015  . Gastroduodenal ulcer 07/30/2015  . Calcium blood increased 07/30/2015  . Pre-syncope 05/17/2015  . Dyspnea 03/31/2015  .  Essential hypertension 03/31/2015  . Bleeding stomach ulcer 10/14/2013  . Anemia, iron deficiency 10/14/2013  . Weakness generalized 10/14/2013  . Ejection fraction   . Pericardial effusion   . PONV (postoperative nausea and vomiting)   . Palpitations   . Low back pain   . Normal nuclear stress test   Alanson Puls, PT, DPT  Arelia Sneddon S 03/14/2017, 9:46 AM  [place] MAIN [facility] SERVICES [address] [place], [place], [address] Phone: [phone]   Fax:  [phone]  Name: KIYANI JERNIGAN MRN: [id number] Date of Birth: October 08, 1946

## 2017-03-14 ENCOUNTER — Ambulatory Visit: Payer: Medicare Other | Admitting: Physical Therapy

## 2017-03-14 ENCOUNTER — Encounter: Payer: Self-pay | Admitting: Physical Therapy

## 2017-03-14 DIAGNOSIS — R262 Difficulty in walking, not elsewhere classified: Secondary | ICD-10-CM | POA: Diagnosis not present

## 2017-03-14 DIAGNOSIS — M6281 Muscle weakness (generalized): Secondary | ICD-10-CM | POA: Diagnosis not present

## 2017-03-14 NOTE — Therapy (Signed)
[place] MAIN [facility] SERVICES [address] [place], [place], [address] Phone: [phone]   Fax:  [phone]  Physical Therapy Treatment  Patient Details  Name: Nancy Blackburn MRN: [id number] Date of Birth: 12-20-1945 Referring Provider: Jennings Books K  Encounter Date: 03/14/2017      PT End of Session - 03/14/17 1600    Visit Number 2   Number of Visits 17   Date for PT Re-Evaluation 2017-05-14   Authorization Type g codes 02-17-2023   PT Start Time 0315   PT Stop Time 0355   PT Time Calculation (min) 40 min   Equipment Utilized During Treatment Gait belt   Activity Tolerance Patient tolerated treatment well;Patient limited by fatigue      Past Medical History:  Diagnosis Date  . Chronic sinus infection   . Ejection fraction    a. EF 55-65%, echo, mild LVH, June, 2013, small pericardial effusion  . Hyperlipidemia    a. takes Simvastatin  . Hypertension    a. takes Metoprolol, Lisinopril, and amlodipine.  . Low back pain   . Mild dementia   . Nasal polyps   . Normal nuclear stress test    a. 07/2005 Nl nuc stress test.  . Palpitations    a. June 2013:  Event recorder normal sinus rhythm with rare PVCs - ? symptomatic PVC's.  . Parkinson disease ([place])   . Pericardial effusion    a. Small, echo, June, 2013  . PONV (postoperative nausea and vomiting)     Past Surgical History:  Procedure Laterality Date  . BACK SURGERY  08/2010  . BLADDER SUSPENSION  2003  . bone spur  2009   removed from right collar bone area and partial collar bone removed  . THYROIDECTOMY, PARTIAL    . TONSILLECTOMY     as child  . TUBAL LIGATION     56yrs ago  . VAGINAL HYSTERECTOMY     70yrs ago d/t cervical cancer    There were no vitals filed for this visit.      Subjective Assessment - 03/14/17 1559    Subjective Patient reports that she  is having trouble with walking. She is not having any pain today.   Pertinent History Patient was Dx  6 months ago and she feels that she is walking slower at home. She is having tremors with right side > left side She continues to be active and has been a little slower the past few days. She had B 12 shots 3 monts ago and now is feeling better.    How long can you sit comfortably? unlimited   How long can you stand comfortably? 30 minutes   How long can you walk comfortably? 30 minutes   Diagnostic tests MRI   Patient Stated Goals to walk better   Currently in Pain? No/denies   Pain Score 0-No pain   Multiple Pain Sites No            [facility] PT Assessment - 03/13/17 1505      Assessment   Medical Diagnosis parkinsons disease   Referring Provider [facility] K   Onset Date/Surgical Date 12/26/16   Hand Dominance Right   Next MD Visit --  03/20/17   Prior Therapy --  no     Balance Screen   Has the patient fallen in the past 6 months No   Has the patient had a decrease in activity level because of a fear of falling?  Yes   Is the patient reluctant to leave their home because of a fear of falling?  No     Home Environment   Living Environment Private residence   Living Arrangements Other relatives   Available Help at Discharge Family   Type of [place] to enter   Entrance Stairs-Number of Steps --  6   Entrance Stairs-Rails Right;Left;Cannot reach both   [place] One level   [place] - 2 wheels;Grab bars - tub/shower     Prior Function   Level of Independence Independent   Vocation Requirements --  retired   Leisure movies, out to eat, read       Patient seen for LSVT Daily Session Maximal Daily Exercises for facilitation/coordination of movement Sustained movements are designed to rescale the amplitude of movement output for generalization to daily functional activities .Performed as follows for 1 set of 10 repetitions each multidirectional sustained movements  1) Floor to ceiling  2) Side to side multidirectional Repetitive  movements performed in standing and are designed to provide retraining effort needed for sustained muscle activation in tasks    3) Step and reach forward  4) Step and reach backwards  5) Step and reach sideways  6) Rock and reach forward/backward  7) Rock and reach sideways Sit to stand functional component task with supervision 5 reps and simulated activities for: hand coordination tasks such as higher level balance  Activities and hand coordination tasks to mimic putting on toothpaste.  Patient ambulated 1000 feet with big arm swings Patient continues to demonstrates less incoordination of movement with select exercises such as rock and reach and stepping backwards                        PT Education - 03/14/17 1600    Education provided Yes   Education Details LSVT BIG   Person(s) Educated Patient   Methods Explanation   Comprehension Verbalized understanding             PT Long Term Goals - 03/14/17 0939      PT LONG TERM GOAL #1   Title Patient will be independent in home exercise program to improve strength/mobility for better functional independence with ADLs.   Time 4   Period Weeks   Status New     PT LONG TERM GOAL #2   Title Patient (> 65 years old) will complete five times sit to stand test in < 15 seconds indicating an increased LE strength and improved balance   Time 4   Period Weeks   Status New     PT LONG TERM GOAL #3   Title Patient will increase six minute walk test distance to >1000 for progression to community ambulator and improve gait ability   Time 4   Period Weeks   Status New     PT LONG TERM GOAL #4   Title Patient will reduce timed up and go to <11 seconds to reduce fall risk and demonstrate improved transfer/gait ability.   Time 4   Period Weeks   Status New     PT LONG TERM GOAL #5   Title Patient will ascend/descend 4 stairs without rail assist independently without loss of balance to improve ability to get in/out  of home.    Time 4   Period Weeks   Status New  Plan - 03/14/17 1600    Clinical Impression Statement Patient seen for LSVT Daily Session Maximal Daily Exercises for facilitation/coordination of movement Sustained movements are designed to rescale the amplitude of movement output for generalization to daily functional activities Patient will continue to benefit from skilled therapy in order to improve dynamic standing balance and increase gait speed  to reduce risk for falls   Rehab Potential Good   Clinical Impairments Affecting Rehab Potential This patient presents with 2, personal factors/ comorbidities she lives alone and current situtation, and 3  body elements including body structures and functions, activity limitations and or participation restrictions including decreased gait and decreased standing balance, falls risk. Patient's condition is  evolving   PT Frequency 4x / week   PT Duration 4 weeks   PT Treatment/Interventions Gait training;Neuromuscular re-education;Balance training;Therapeutic exercise;Therapeutic activities;Patient/family education   PT Next Visit Plan LSVT BIG   PT Home Exercise Plan LSVT BIG   Consulted and Agree with Plan of Care Patient      Patient will benefit from skilled therapeutic intervention in order to improve the following deficits and impairments:  Abnormal gait, Decreased balance, Decreased endurance, Decreased mobility, Difficulty walking, Decreased activity tolerance, Decreased safety awareness, Decreased strength, Pain, Decreased cognition  Visit Diagnosis: Difficulty in walking, not elsewhere classified  Muscle weakness (generalized)       G-Codes - 03-24-17 1536    Functional Assessment Tool Used (Outpatient Only) Tug, 10 MW, 6 MW, BERG, 5 x sit to stand   Functional Limitation Mobility: Walking and moving around   Mobility: Walking and Moving Around Current Status [phone]) At least 40 percent but less than 60  percent impaired, limited or restricted   Mobility: Walking and Moving Around Goal Status [phone]) At least 20 percent but less than 40 percent impaired, limited or restricted      Problem List Patient Active Problem List   Diagnosis Date Noted  . Family history of Cushing disease 01/12/2017  . B12 deficiency 12/18/2016  . Mild cognitive impairment with memory loss 12/18/2016  . Parkinsonian features 12/18/2016  . Vitamin D deficiency 12/18/2016  . Chalazion, bilateral 06/08/2016  . Absolute anemia 07/30/2015  . Edema extremities 07/30/2015  . Benign essential tremor 07/30/2015  . Anxiety, generalized 07/30/2015  . Gastro-esophageal reflux disease without esophagitis 07/30/2015  . HLD (hyperlipidemia) 07/30/2015  . Cannot sleep 07/30/2015  . Lumbar radiculopathy 07/30/2015  . Depression, major, recurrent, in partial remission ([place]) 07/30/2015  . Arthritis, degenerative 07/30/2015  . Allergic rhinitis 07/30/2015  . Gastroduodenal ulcer 07/30/2015  . Calcium blood increased 07/30/2015  . Pre-syncope 05/17/2015  . Dyspnea 03/31/2015  . Essential hypertension 03/31/2015  . Bleeding stomach ulcer 10/14/2013  . Anemia, iron deficiency 10/14/2013  . Weakness generalized 10/14/2013  . Ejection fraction   . Pericardial effusion   . PONV (postoperative nausea and vomiting)   . Palpitations   . Low back pain   . Normal nuclear stress test   Alanson Puls, PT, DPT  Arelia Sneddon S 03/14/2017, 4:08 PM  [place] MAIN [facility] SERVICES [address] [place], [place], [address] Phone: [phone]   Fax:  [phone]  Name: Nancy Blackburn MRN: [id number] Date of Birth: 09/15/1946

## 2017-03-15 ENCOUNTER — Encounter: Payer: Self-pay | Admitting: Physical Therapy

## 2017-03-15 ENCOUNTER — Ambulatory Visit: Payer: Medicare Other | Admitting: Physical Therapy

## 2017-03-15 DIAGNOSIS — R262 Difficulty in walking, not elsewhere classified: Secondary | ICD-10-CM

## 2017-03-15 DIAGNOSIS — M6281 Muscle weakness (generalized): Secondary | ICD-10-CM | POA: Diagnosis not present

## 2017-03-15 NOTE — Therapy (Signed)
[place] MAIN [facility] SERVICES [address] [place], [place], [address] Phone: [phone]   Fax:  [phone]  Physical Therapy Treatment  Patient Details  Name: Nancy Blackburn MRN: [id number] Date of Birth: 12/08/1946 Referring Provider: Jennings Books K  Encounter Date: 03/15/2017      PT End of Session - 03/15/17 1517    Visit Number 3   Number of Visits 17   Date for PT Re-Evaluation Apr 24, 2017   Authorization Type g codes 2023/02/26   PT Start Time 0308   PT Stop Time 0403   PT Time Calculation (min) 55 min   Equipment Utilized During Treatment Gait belt   Activity Tolerance Patient tolerated treatment well;Patient limited by fatigue      Past Medical History:  Diagnosis Date  . Chronic sinus infection   . Ejection fraction    a. EF 55-65%, echo, mild LVH, June, 2013, small pericardial effusion  . Hyperlipidemia    a. takes Simvastatin  . Hypertension    a. takes Metoprolol, Lisinopril, and amlodipine.  . Low back pain   . Mild dementia   . Nasal polyps   . Normal nuclear stress test    a. 07/2005 Nl nuc stress test.  . Palpitations    a. June 2013:  Event recorder normal sinus rhythm with rare PVCs - ? symptomatic PVC's.  . Parkinson disease ([place])   . Pericardial effusion    a. Small, echo, June, 2013  . PONV (postoperative nausea and vomiting)     Past Surgical History:  Procedure Laterality Date  . BACK SURGERY  08/2010  . BLADDER SUSPENSION  03/17/2002  . bone spur  2008/03/17   removed from right collar bone area and partial collar bone removed  . THYROIDECTOMY, PARTIAL    . TONSILLECTOMY     as child  . TUBAL LIGATION     45yrs ago  . VAGINAL HYSTERECTOMY     33yrs ago d/t cervical cancer    There were no vitals filed for this visit.      Subjective Assessment - 03/15/17 1516    Subjective Patient reports that she  is having trouble with walking. She is not having any pain today. She had difficulty with the  exercises.    Pertinent History Patient was Dx 6 months ago and she feels that she is walking slower at home. She is having tremors with right side > left side She continues to be active and has been a little slower the past few days. She had B 12 shots 3 monts ago and now is feeling better.    How long can you sit comfortably? unlimited   How long can you stand comfortably? 30 minutes   How long can you walk comfortably? 30 minutes   Diagnostic tests MRI   Patient Stated Goals to walk better   Currently in Pain? No/denies   Pain Score 0-No pain   Multiple Pain Sites No       Patient seen for LSVT Daily Session Maximal Daily Exercises for facilitation/coordination of movement Sustained movements are designed to rescale the amplitude of movement output for generalization to daily functional activities .Performed as follows for 1 set of 10 repetitions each multidirectional sustained movements  1) Floor to ceiling  2) Side to side multidirectional Repetitive movements performed in standing and are designed to provide retraining effort needed for sustained muscle activation in tasks   3) Step and reach forward  4) Step and  reach backwards  5) Step and reach sideways  6) Rock and reach forward/backward  7) Rock and reach sideways Sit to stand functional component task with supervision 5 reps and simulated activities for: hand coordination tasks such as higher level balance  Activities and hand coordination tasks to mimic putting on toothpaste.  Patient ambulated 1000 feet with big arm swings Patient continues to demonstrates less incoordination of movement with select exercises such as rock and reach and stepping backwards                            PT Education - 03/15/17 1517    Education provided Yes   Education Details LSVT BIG   Person(s) Educated Patient   Methods Explanation   Comprehension Verbalized understanding             PT Long Term Goals -  03/14/17 0939      PT LONG TERM GOAL #1   Title Patient will be independent in home exercise program to improve strength/mobility for better functional independence with ADLs.   Time 4   Period Weeks   Status New     PT LONG TERM GOAL #2   Title Patient (> 73 years old) will complete five times sit to stand test in < 15 seconds indicating an increased LE strength and improved balance   Time 4   Period Weeks   Status New     PT LONG TERM GOAL #3   Title Patient will increase six minute walk test distance to >1000 for progression to community ambulator and improve gait ability   Time 4   Period Weeks   Status New     PT LONG TERM GOAL #4   Title Patient will reduce timed up and go to <11 seconds to reduce fall risk and demonstrate improved transfer/gait ability.   Time 4   Period Weeks   Status New     PT LONG TERM GOAL #5   Title Patient will ascend/descend 4 stairs without rail assist independently without loss of balance to improve ability to get in/out of home.    Time 4   Period Weeks   Status New               Plan - 03/15/17 1518    Clinical Impression Statement . Patient responds well to verbal and tactile cues to correct form and technique.  CGA to SBA for safety with activities.  Cues to increase intensity and amplitude of movements throughout session. Patient will benefit from further skilled therapy focused on improving strength and balance to return to prior level of function   Rehab Potential Good   Clinical Impairments Affecting Rehab Potential This patient presents with 2, personal factors/ comorbidities she lives alone and current situtation, and 3  body elements including body structures and functions, activity limitations and or participation restrictions including decreased gait and decreased standing balance, falls risk. Patient's condition is  evolving   PT Frequency 4x / week   PT Duration 4 weeks   PT Treatment/Interventions Gait  training;Neuromuscular re-education;Balance training;Therapeutic exercise;Therapeutic activities;Patient/family education   PT Next Visit Plan LSVT BIG   PT Home Exercise Plan LSVT BIG   Consulted and Agree with Plan of Care Patient      Patient will benefit from skilled therapeutic intervention in order to improve the following deficits and impairments:  Abnormal gait, Decreased balance, Decreased endurance, Decreased mobility, Difficulty walking, Decreased activity  tolerance, Decreased safety awareness, Decreased strength, Pain, Decreased cognition  Visit Diagnosis: Difficulty in walking, not elsewhere classified  Muscle weakness (generalized)     Problem List Patient Active Problem List   Diagnosis Date Noted  . Family history of Cushing disease 01/12/2017  . B12 deficiency 12/18/2016  . Mild cognitive impairment with memory loss 12/18/2016  . Parkinsonian features 12/18/2016  . Vitamin D deficiency 12/18/2016  . Chalazion, bilateral 06/08/2016  . Absolute anemia 07/30/2015  . Edema extremities 07/30/2015  . Benign essential tremor 07/30/2015  . Anxiety, generalized 07/30/2015  . Gastro-esophageal reflux disease without esophagitis 07/30/2015  . HLD (hyperlipidemia) 07/30/2015  . Cannot sleep 07/30/2015  . Lumbar radiculopathy 07/30/2015  . Depression, major, recurrent, in partial remission ([place]) 07/30/2015  . Arthritis, degenerative 07/30/2015  . Allergic rhinitis 07/30/2015  . Gastroduodenal ulcer 07/30/2015  . Calcium blood increased 07/30/2015  . Pre-syncope 05/17/2015  . Dyspnea 03/31/2015  . Essential hypertension 03/31/2015  . Bleeding stomach ulcer 10/14/2013  . Anemia, iron deficiency 10/14/2013  . Weakness generalized 10/14/2013  . Ejection fraction   . Pericardial effusion   . PONV (postoperative nausea and vomiting)   . Palpitations   . Low back pain   . Normal nuclear stress test   Alanson Puls, PT, DPT  Arelia Sneddon S 03/15/2017,  3:20 PM  [place] MAIN [facility] SERVICES [address] [place], [place], [address] Phone: [phone]   Fax:  [phone]  Name: Nancy Blackburn MRN: [id number] Date of Birth: [birth year]

## 2017-03-16 ENCOUNTER — Ambulatory Visit: Payer: Medicare Other | Admitting: Physical Therapy

## 2017-03-16 ENCOUNTER — Encounter: Payer: Self-pay | Admitting: Physical Therapy

## 2017-03-16 DIAGNOSIS — M6281 Muscle weakness (generalized): Secondary | ICD-10-CM

## 2017-03-16 DIAGNOSIS — R262 Difficulty in walking, not elsewhere classified: Secondary | ICD-10-CM | POA: Diagnosis not present

## 2017-03-16 NOTE — Therapy (Addendum)
[place] MAIN [facility] SERVICES [address] [place], [place], [address] Phone: [phone]   Fax:  [phone]  Physical Therapy Treatment  Patient Details  Name: Nancy Blackburn MRN: [id number] Date of Birth: 1946/05/18 Referring Provider: Jennings Books K  Encounter Date: 03/16/2017      PT End of Session - 03/16/17 1514    Visit Number 4   Number of Visits 17   Date for PT Re-Evaluation Apr 25, 2017   Authorization Type g codes 4/10   PT Start Time 0300   PT Stop Time 0400   PT Time Calculation (min) 60 min   Equipment Utilized During Treatment Gait belt   Activity Tolerance Patient tolerated treatment well;Patient limited by fatigue   Behavior During Therapy [facility] for tasks assessed/performed      Past Medical History:  Diagnosis Date  . Chronic sinus infection   . Ejection fraction    a. EF 55-65%, echo, mild LVH, June, 2013, small pericardial effusion  . Hyperlipidemia    a. takes Simvastatin  . Hypertension    a. takes Metoprolol, Lisinopril, and amlodipine.  . Low back pain   . Mild dementia   . Nasal polyps   . Normal nuclear stress test    a. 07/2005 Nl nuc stress test.  . Palpitations    a. June 2013:  Event recorder normal sinus rhythm with rare PVCs - ? symptomatic PVC's.  . Parkinson disease ([place])   . Pericardial effusion    a. Small, echo, June, 2013  . PONV (postoperative nausea and vomiting)     Past Surgical History:  Procedure Laterality Date  . BACK SURGERY  08/2010  . BLADDER SUSPENSION  2003  . bone spur  2009   removed from right collar bone area and partial collar bone removed  . THYROIDECTOMY, PARTIAL    . TONSILLECTOMY     as child  . TUBAL LIGATION     71yrs ago  . VAGINAL HYSTERECTOMY     72yrs ago d/t cervical cancer    There were no vitals filed for this visit.      Subjective Assessment - 03/16/17 1513    Subjective Patient reports that she  is having trouble with walking. She is not  having any pain today. She had difficulty with the exercises.    Pertinent History Patient was Dx 6 months ago and she feels that she is walking slower at home. She is having tremors with right side > left side She continues to be active and has been a little slower the past few days. She had B 12 shots 3 monts ago and now is feeling better.    How long can you sit comfortably? unlimited   How long can you stand comfortably? 30 minutes   How long can you walk comfortably? 30 minutes   Diagnostic tests MRI   Patient Stated Goals to walk better   Currently in Pain? No/denies   Pain Score 0-No pain         Patient seen for LSVT Daily Session Maximal Daily Exercises for facilitation/coordination of movement Sustained movements are designed to rescale the amplitude of movement output for generalization to daily functional activities .Performed as follows for 1 set of 10 repetitions each multidirectional sustained movements  1) Floor to ceiling  2) Side to side multidirectional Repetitive movements performed in standing and are designed to provide retraining effort needed for sustained muscle activation in tasks    3) Step and  reach forward  4) Step and reach backwards  5) Step and reach sideways  6) Rock and reach forward/backward  7) Rock and reach sideways Sit to stand functional component task with supervision 5 reps and simulated activities for: hand coordination tasks such as putting on toothpaste and burshing her teeth,using the toothbrush to brush her teeth with good motor control and coordination,  balance tasks for higher level reaching and weight shifting with min assist using blue foam and cones to challenge her dynamic standing balance.  Gait training 3000 feet with cues for big arm swings.                         PT Education - 03/16/17 1514    Education provided Yes   Education Details LSVT BIG   Person(s) Educated Patient   Methods Explanation    Comprehension Verbalized understanding             PT Long Term Goals - 03/14/17 0939      PT LONG TERM GOAL #1   Title Patient will be independent in home exercise program to improve strength/mobility for better functional independence with ADLs.   Time 4   Period Weeks   Status New     PT LONG TERM GOAL #2   Title Patient (> 72 years old) will complete five times sit to stand test in < 15 seconds indicating an increased LE strength and improved balance   Time 4   Period Weeks   Status New     PT LONG TERM GOAL #3   Title Patient will increase six minute walk test distance to >1000 for progression to community ambulator and improve gait ability   Time 4   Period Weeks   Status New     PT LONG TERM GOAL #4   Title Patient will reduce timed up and go to <11 seconds to reduce fall risk and demonstrate improved transfer/gait ability.   Time 4   Period Weeks   Status New     PT LONG TERM GOAL #5   Title Patient will ascend/descend 4 stairs without rail assist independently without loss of balance to improve ability to get in/out of home.    Time 4   Period Weeks   Status New               Plan - 03/16/17 1515    Clinical Impression Statement Min cueing needed to appropriately perform LSVT tasks with leg, hand, and head position. Decreased coordination demonstrated requiring consistent verbal cueing to correct form. Cognitive understanding of task was delayed. Patient will benefit from further skilled therapy focused on improving strength and balance to return to prior level of function   Rehab Potential Good   Clinical Impairments Affecting Rehab Potential This patient presents with 2, personal factors/ comorbidities she lives alone and current situtation, and 3  body elements including body structures and functions, activity limitations and or participation restrictions including decreased gait and decreased standing balance, falls risk. Patient's condition is   evolving   PT Frequency 4x / week   PT Duration 4 weeks   PT Treatment/Interventions Gait training;Neuromuscular re-education;Balance training;Therapeutic exercise;Therapeutic activities;Patient/family education   PT Next Visit Plan LSVT BIG   PT Home Exercise Plan LSVT BIG   Consulted and Agree with Plan of Care Patient      Patient will benefit from skilled therapeutic intervention in order to improve the following deficits and impairments:  Abnormal gait, Decreased balance, Decreased endurance, Decreased mobility, Difficulty walking, Decreased activity tolerance, Decreased safety awareness, Decreased strength, Pain, Decreased cognition  Visit Diagnosis: Difficulty in walking, not elsewhere classified  Muscle weakness (generalized)     Problem List Patient Active Problem List   Diagnosis Date Noted  . Family history of Cushing disease 01/12/2017  . B12 deficiency 12/18/2016  . Mild cognitive impairment with memory loss 12/18/2016  . Parkinsonian features 12/18/2016  . Vitamin D deficiency 12/18/2016  . Chalazion, bilateral 06/08/2016  . Absolute anemia 07/30/2015  . Edema extremities 07/30/2015  . Benign essential tremor 07/30/2015  . Anxiety, generalized 07/30/2015  . Gastro-esophageal reflux disease without esophagitis 07/30/2015  . HLD (hyperlipidemia) 07/30/2015  . Cannot sleep 07/30/2015  . Lumbar radiculopathy 07/30/2015  . Depression, major, recurrent, in partial remission ([place]) 07/30/2015  . Arthritis, degenerative 07/30/2015  . Allergic rhinitis 07/30/2015  . Gastroduodenal ulcer 07/30/2015  . Calcium blood increased 07/30/2015  . Pre-syncope 05/17/2015  . Dyspnea 03/31/2015  . Essential hypertension 03/31/2015  . Bleeding stomach ulcer 10/14/2013  . Anemia, iron deficiency 10/14/2013  . Weakness generalized 10/14/2013  . Ejection fraction   . Pericardial effusion   . PONV (postoperative nausea and vomiting)   . Palpitations   . Low back pain   . Normal  nuclear stress test    Alanson Puls, PT, DPT Arelia Sneddon S 03/16/2017, 4:57 PM  [place] MAIN [facility] SERVICES [address] [place], [place], [address] Phone: [phone]   Fax:  [phone]  Name: GRACIE GUPTA MRN: [id number] Date of Birth: 02-Jul-1946

## 2017-03-20 ENCOUNTER — Ambulatory Visit: Payer: Medicare Other | Admitting: Physical Therapy

## 2017-03-20 ENCOUNTER — Encounter: Payer: Self-pay | Admitting: Physical Therapy

## 2017-03-20 DIAGNOSIS — Z8659 Personal history of other mental and behavioral disorders: Secondary | ICD-10-CM | POA: Diagnosis not present

## 2017-03-20 DIAGNOSIS — R262 Difficulty in walking, not elsewhere classified: Secondary | ICD-10-CM | POA: Diagnosis not present

## 2017-03-20 DIAGNOSIS — G3184 Mild cognitive impairment, so stated: Secondary | ICD-10-CM | POA: Diagnosis not present

## 2017-03-20 DIAGNOSIS — G250 Essential tremor: Secondary | ICD-10-CM | POA: Diagnosis not present

## 2017-03-20 DIAGNOSIS — M6281 Muscle weakness (generalized): Secondary | ICD-10-CM | POA: Diagnosis not present

## 2017-03-20 DIAGNOSIS — E538 Deficiency of other specified B group vitamins: Secondary | ICD-10-CM | POA: Diagnosis not present

## 2017-03-20 NOTE — Therapy (Signed)
[place] MAIN [facility] SERVICES [address] [place], [place], [address] Phone: [phone]   Fax:  [phone]  Physical Therapy Treatment  Patient Details  Name: Nancy Blackburn MRN: [id number] Date of Birth: 07/02/1946 Referring Provider: Jennings Books K  Encounter Date: 03/20/2017      PT End of Session - 03/20/17 1506    Visit Number 5   Number of Visits 17   Date for PT Re-Evaluation 2017/05/08   Authorization Type g codes 5/10   PT Start Time 1500   PT Stop Time 1600   PT Time Calculation (min) 60 min   Equipment Utilized During Treatment Gait belt   Activity Tolerance Patient tolerated treatment well;Patient limited by fatigue   Behavior During Therapy [facility] for tasks assessed/performed      Past Medical History:  Diagnosis Date  . Chronic sinus infection   . Ejection fraction    a. EF 55-65%, echo, mild LVH, June, 2013, small pericardial effusion  . Hyperlipidemia    a. takes Simvastatin  . Hypertension    a. takes Metoprolol, Lisinopril, and amlodipine.  . Low back pain   . Mild dementia   . Nasal polyps   . Normal nuclear stress test    a. 07/2005 Nl nuc stress test.  . Palpitations    a. June 2013:  Event recorder normal sinus rhythm with rare PVCs - ? symptomatic PVC's.  . Parkinson disease ([place])   . Pericardial effusion    a. Small, echo, June, 2013  . PONV (postoperative nausea and vomiting)     Past Surgical History:  Procedure Laterality Date  . BACK SURGERY  08/2010  . BLADDER SUSPENSION  2003  . bone spur  2009   removed from right collar bone area and partial collar bone removed  . THYROIDECTOMY, PARTIAL    . TONSILLECTOMY     as child  . TUBAL LIGATION     90yrs ago  . VAGINAL HYSTERECTOMY     76yrs ago d/t cervical cancer    There were no vitals filed for this visit.      Subjective Assessment - 03/20/17 1505    Subjective Patient had a bad weekend and was not able to get out of the bed on  saturday due to fatigue.    Pertinent History Patient was Dx 6 months ago and she feels that she is walking slower at home. She is having tremors with right side > left side She continues to be active and has been a little slower the past few days. She had B 12 shots 3 monts ago and now is feeling better.    How long can you sit comfortably? unlimited   How long can you stand comfortably? 30 minutes   How long can you walk comfortably? 30 minutes   Diagnostic tests MRI   Patient Stated Goals to walk better   Currently in Pain? No/denies   Pain Score 0-No pain    Treatment  Patient seen for LSVT Daily Session Maximal Daily Exercises for facilitation/coordination of movement Sustained movements are designed to rescale the amplitude of movement output for generalization to daily functional activities .Performed as follows for 1 set of 10 repetitions each multidirectional sustained movements  1) Floor to ceiling  2) Side to side multidirectional Repetitive movements performed in standing and are designed to provide retraining effort needed for sustained muscle activation in tasks   3) Step and reach forward  4) Step and reach  backwards  5) Step and reach sideways  6) Rock and reach forward/backward  7) Rock and reach sideways Sit to stand functional component task with supervision 5 reps and simulated activities for: hand coordination tasks such as putting on toothpaste and burshing her teeth,using the toothbrush to brush her teeth with good motor control and coordination,  balance tasks for higher level reaching and weight shifting with min assist using blue foam and cones to challenge her dynamic standing balance.  Gait training 3000 feet with cues for big arm swings.                           PT Education - 03/20/17 1506    Education provided Yes   Education Details LSVT BIG   Person(s) Educated Patient   Methods Explanation   Comprehension Verbalized understanding              PT Long Term Goals - 03/14/17 0939      PT LONG TERM GOAL #1   Title Patient will be independent in home exercise program to improve strength/mobility for better functional independence with ADLs.   Time 4   Period Weeks   Status New     PT LONG TERM GOAL #2   Title Patient (> 46 years old) will complete five times sit to stand test in < 15 seconds indicating an increased LE strength and improved balance   Time 4   Period Weeks   Status New     PT LONG TERM GOAL #3   Title Patient will increase six minute walk test distance to >1000 for progression to community ambulator and improve gait ability   Time 4   Period Weeks   Status New     PT LONG TERM GOAL #4   Title Patient will reduce timed up and go to <11 seconds to reduce fall risk and demonstrate improved transfer/gait ability.   Time 4   Period Weeks   Status New     PT LONG TERM GOAL #5   Title Patient will ascend/descend 4 stairs without rail assist independently without loss of balance to improve ability to get in/out of home.    Time 4   Period Weeks   Status New               Plan - 03/20/17 1507    Clinical Impression Statement Continues to have balance deficits typical with diagnosis. Patient performs intermediate level exercises without pain behaviors and needs verbal cuing for postural alignment and head positioning.Patient would benefit from additional skilled PT intervention to improve strength and mobility and reduce fall risk;   Rehab Potential Good   Clinical Impairments Affecting Rehab Potential This patient presents with 2, personal factors/ comorbidities she lives alone and current situtation, and 3  body elements including body structures and functions, activity limitations and or participation restrictions including decreased gait and decreased standing balance, falls risk. Patient's condition is  evolving   PT Frequency 4x / week   PT Duration 4 weeks   PT  Treatment/Interventions Gait training;Neuromuscular re-education;Balance training;Therapeutic exercise;Therapeutic activities;Patient/family education   PT Next Visit Plan LSVT BIG   PT Home Exercise Plan LSVT BIG   Consulted and Agree with Plan of Care Patient      Patient will benefit from skilled therapeutic intervention in order to improve the following deficits and impairments:  Abnormal gait, Decreased balance, Decreased endurance, Decreased mobility, Difficulty walking, Decreased activity tolerance,  Decreased safety awareness, Decreased strength, Pain, Decreased cognition  Visit Diagnosis: Difficulty in walking, not elsewhere classified  Muscle weakness (generalized)     Problem List Patient Active Problem List   Diagnosis Date Noted  . Family history of Cushing disease 01/12/2017  . B12 deficiency 12/18/2016  . Mild cognitive impairment with memory loss 12/18/2016  . Parkinsonian features 12/18/2016  . Vitamin D deficiency 12/18/2016  . Chalazion, bilateral 06/08/2016  . Absolute anemia 07/30/2015  . Edema extremities 07/30/2015  . Benign essential tremor 07/30/2015  . Anxiety, generalized 07/30/2015  . Gastro-esophageal reflux disease without esophagitis 07/30/2015  . HLD (hyperlipidemia) 07/30/2015  . Cannot sleep 07/30/2015  . Lumbar radiculopathy 07/30/2015  . Depression, major, recurrent, in partial remission ([place]) 07/30/2015  . Arthritis, degenerative 07/30/2015  . Allergic rhinitis 07/30/2015  . Gastroduodenal ulcer 07/30/2015  . Calcium blood increased 07/30/2015  . Pre-syncope 05/17/2015  . Dyspnea 03/31/2015  . Essential hypertension 03/31/2015  . Bleeding stomach ulcer 10/14/2013  . Anemia, iron deficiency 10/14/2013  . Weakness generalized 10/14/2013  . Ejection fraction   . Pericardial effusion   . PONV (postoperative nausea and vomiting)   . Palpitations   . Low back pain   . Normal nuclear stress test    Alanson Puls, PT,  DPT Arelia Sneddon S 03/20/2017, 3:09 PM  [place] MAIN [facility] SERVICES [address] [place], [place], [address] Phone: [phone]   Fax:  [phone]  Name: Nancy Blackburn MRN: [id number] Date of Birth: 01/17/1946

## 2017-03-21 ENCOUNTER — Encounter: Payer: Self-pay | Admitting: Physical Therapy

## 2017-03-21 ENCOUNTER — Ambulatory Visit: Payer: Medicare Other | Admitting: Physical Therapy

## 2017-03-21 DIAGNOSIS — R262 Difficulty in walking, not elsewhere classified: Secondary | ICD-10-CM

## 2017-03-21 DIAGNOSIS — M6281 Muscle weakness (generalized): Secondary | ICD-10-CM

## 2017-03-21 NOTE — Therapy (Signed)
[place] MAIN [facility] SERVICES [address] [place], [place], [address] Phone: [phone]   Fax:  [phone]  Physical Therapy Treatment  Patient Details  Name: Nancy Blackburn MRN: [id number] Date of Birth: 1946/06/17 Referring Provider: Jennings Books K  Encounter Date: 03/21/2017      PT End of Session - 03/21/17 1502    Visit Number 6   Number of Visits 17   Date for PT Re-Evaluation 04/26/17   Authorization Type g codes 6/10   PT Start Time 1500   PT Stop Time 1600   PT Time Calculation (min) 60 min   Equipment Utilized During Treatment Gait belt   Activity Tolerance Patient tolerated treatment well;Patient limited by fatigue   Behavior During Therapy [facility] for tasks assessed/performed      Past Medical History:  Diagnosis Date  . Chronic sinus infection   . Ejection fraction    a. EF 55-65%, echo, mild LVH, June, 2013, small pericardial effusion  . Hyperlipidemia    a. takes Simvastatin  . Hypertension    a. takes Metoprolol, Lisinopril, and amlodipine.  . Low back pain   . Mild dementia   . Nasal polyps   . Normal nuclear stress test    a. 07/2005 Nl nuc stress test.  . Palpitations    a. June 2013:  Event recorder normal sinus rhythm with rare PVCs - ? symptomatic PVC's.  . Parkinson disease ([place])   . Pericardial effusion    a. Small, echo, June, 2013  . PONV (postoperative nausea and vomiting)     Past Surgical History:  Procedure Laterality Date  . BACK SURGERY  08/2010  . BLADDER SUSPENSION  2003  . bone spur  2009   removed from right collar bone area and partial collar bone removed  . THYROIDECTOMY, PARTIAL    . TONSILLECTOMY     as child  . TUBAL LIGATION     76yrs ago  . VAGINAL HYSTERECTOMY     32yrs ago d/t cervical cancer    There were no vitals filed for this visit.      Subjective Assessment - 03/21/17 1501    Subjective Patient had a busy day today with her daughters appointments. She  is doing her exercises.    Pertinent History Patient was Dx 6 months ago and she feels that she is walking slower at home. She is having tremors with right side > left side She continues to be active and has been a little slower the past few days. She had B 12 shots 3 monts ago and now is feeling better.    How long can you sit comfortably? unlimited   How long can you stand comfortably? 30 minutes   How long can you walk comfortably? 30 minutes   Diagnostic tests MRI   Patient Stated Goals to walk better   Currently in Pain? No/denies   Pain Score 0-No pain   Multiple Pain Sites No      Treatment Patient seen for LSVT Daily Session Maximal Daily Exercises for facilitation/coordination of movement Sustained movements are designed to rescale the amplitude of movement output for generalization to daily functional activities .Performed as follows for 1 set of 10 repetitions each multidirectional sustained movements  1) Floor to ceiling  2) Side to side multidirectional Repetitive movements performed in standing and are designed to provide retraining effort needed for sustained muscle activation in tasks  3) Step and reach forward  4) Step and  reach backwards  5) Step and reach sideways 6) Rock and reach forward/backward  7) Rock and reach sideways Sit to stand functional component task with supervision 5 reps and simulated activities for: hand coordination tasks such as putting on toothpaste and burshing her teeth,using the toothbrush to brush her teeth with good motor control and coordination, balance tasks for higher level reaching and weight shifting with min assist using blue foam and cones to challenge her dynamic standing balance.  Gait training 3000 feet with cues for big arm swings. Patient has 2 episodes of loss of balance with stepping fwd and needs to slow down and take smaller steps. She has decreased motor control and forgets to turn her head or rotate her arms with palms up and  does better with practice. She has difficulty with the first repetition of each exercise and then is able to perform with verbal cues.                             PT Education - 03/21/17 1502    Education provided Yes   Education Details LSVT BIG   Person(s) Educated Patient   Methods Explanation   Comprehension Verbalized understanding             PT Long Term Goals - 03/14/17 0939      PT LONG TERM GOAL #1   Title Patient will be independent in home exercise program to improve strength/mobility for better functional independence with ADLs.   Time 4   Period Weeks   Status New     PT LONG TERM GOAL #2   Title Patient (> 80 years old) will complete five times sit to stand test in < 15 seconds indicating an increased LE strength and improved balance   Time 4   Period Weeks   Status New     PT LONG TERM GOAL #3   Title Patient will increase six minute walk test distance to >1000 for progression to community ambulator and improve gait ability   Time 4   Period Weeks   Status New     PT LONG TERM GOAL #4   Title Patient will reduce timed up and go to <11 seconds to reduce fall risk and demonstrate improved transfer/gait ability.   Time 4   Period Weeks   Status New     PT LONG TERM GOAL #5   Title Patient will ascend/descend 4 stairs without rail assist independently without loss of balance to improve ability to get in/out of home.    Time 4   Period Weeks   Status New               Plan - 03/21/17 1503    Clinical Impression Statement Patient continues to demonstrates less incoordination of movement with select exercises such as rock and reach and stepping backwards. Patient responds well to verbal and tactile cues to correct form and technique. Patient is able to catch mistakes in technique with incorrect positions and is able remember the start and finish positions. Motor control of LE much improved.  Muscle fatigue but no major pain  complaintsPt will continue to benefit from skilled PT services to work on balance and strength .   Rehab Potential Good   Clinical Impairments Affecting Rehab Potential This patient presents with 2, personal factors/ comorbidities she lives alone and current situtation, and 3  body elements including body structures and functions, activity limitations and  or participation restrictions including decreased gait and decreased standing balance, falls risk. Patient's condition is  evolving   PT Frequency 4x / week   PT Duration 4 weeks   PT Treatment/Interventions Gait training;Neuromuscular re-education;Balance training;Therapeutic exercise;Therapeutic activities;Patient/family education   PT Next Visit Plan LSVT BIG   PT Home Exercise Plan LSVT BIG   Consulted and Agree with Plan of Care Patient      Patient will benefit from skilled therapeutic intervention in order to improve the following deficits and impairments:  Abnormal gait, Decreased balance, Decreased endurance, Decreased mobility, Difficulty walking, Decreased activity tolerance, Decreased safety awareness, Decreased strength, Pain, Decreased cognition  Visit Diagnosis: Difficulty in walking, not elsewhere classified  Muscle weakness (generalized)     Problem List Patient Active Problem List   Diagnosis Date Noted  . Family history of Cushing disease 01/12/2017  . B12 deficiency 12/18/2016  . Mild cognitive impairment with memory loss 12/18/2016  . Parkinsonian features 12/18/2016  . Vitamin D deficiency 12/18/2016  . Chalazion, bilateral 06/08/2016  . Absolute anemia 07/30/2015  . Edema extremities 07/30/2015  . Benign essential tremor 07/30/2015  . Anxiety, generalized 07/30/2015  . Gastro-esophageal reflux disease without esophagitis 07/30/2015  . HLD (hyperlipidemia) 07/30/2015  . Cannot sleep 07/30/2015  . Lumbar radiculopathy 07/30/2015  . Depression, major, recurrent, in partial remission ([place]) 07/30/2015  .  Arthritis, degenerative 07/30/2015  . Allergic rhinitis 07/30/2015  . Gastroduodenal ulcer 07/30/2015  . Calcium blood increased 07/30/2015  . Pre-syncope 05/17/2015  . Dyspnea 03/31/2015  . Essential hypertension 03/31/2015  . Bleeding stomach ulcer 10/14/2013  . Anemia, iron deficiency 10/14/2013  . Weakness generalized 10/14/2013  . Ejection fraction   . Pericardial effusion   . PONV (postoperative nausea and vomiting)   . Palpitations   . Low back pain   . Normal nuclear stress test    Alanson Puls, PT, DPT Arelia Sneddon S 03/21/2017, 3:06 PM  [place] MAIN [facility] SERVICES [address] [place], [place], [address] Phone: [phone]   Fax:  [phone]  Name: Nancy Blackburn MRN: [id number] Date of Birth: 1946-07-26

## 2017-03-22 ENCOUNTER — Encounter: Payer: Self-pay | Admitting: Physical Therapy

## 2017-03-22 ENCOUNTER — Ambulatory Visit: Payer: Medicare Other | Admitting: Physical Therapy

## 2017-03-22 ENCOUNTER — Other Ambulatory Visit: Payer: Self-pay | Admitting: Family Medicine

## 2017-03-22 DIAGNOSIS — R262 Difficulty in walking, not elsewhere classified: Secondary | ICD-10-CM | POA: Diagnosis not present

## 2017-03-22 DIAGNOSIS — M6281 Muscle weakness (generalized): Secondary | ICD-10-CM | POA: Diagnosis not present

## 2017-03-22 DIAGNOSIS — Z1231 Encounter for screening mammogram for malignant neoplasm of breast: Secondary | ICD-10-CM

## 2017-03-22 NOTE — Therapy (Signed)
[place] MAIN [facility] SERVICES [address] [place], [place], [address] Phone: [phone]   Fax:  [phone]  Physical Therapy Treatment  Patient Details  Name: Nancy Blackburn MRN: [id number] Date of Birth: August 20, 1946 Referring Provider: Jennings Books K  Encounter Date: 03/22/2017      PT End of Session - 03/22/17 1537    Visit Number 7   Number of Visits 17   Date for PT Re-Evaluation May 03, 2017   Authorization Type g codes 7/10   PT Start Time 1500   PT Stop Time 1545   PT Time Calculation (min) 45 min   Equipment Utilized During Treatment Gait belt   Activity Tolerance Patient tolerated treatment well;Patient limited by fatigue   Behavior During Therapy [facility] for tasks assessed/performed      Past Medical History:  Diagnosis Date  . Chronic sinus infection   . Ejection fraction    a. EF 55-65%, echo, mild LVH, June, 2013, small pericardial effusion  . Hyperlipidemia    a. takes Simvastatin  . Hypertension    a. takes Metoprolol, Lisinopril, and amlodipine.  . Low back pain   . Mild dementia   . Nasal polyps   . Normal nuclear stress test    a. 07/2005 Nl nuc stress test.  . Palpitations    a. June 2013:  Event recorder normal sinus rhythm with rare PVCs - ? symptomatic PVC's.  . Parkinson disease ([place])   . Pericardial effusion    a. Small, echo, June, 2013  . PONV (postoperative nausea and vomiting)     Past Surgical History:  Procedure Laterality Date  . BACK SURGERY  08/2010  . BLADDER SUSPENSION  2003  . bone spur  2009   removed from right collar bone area and partial collar bone removed  . THYROIDECTOMY, PARTIAL    . TONSILLECTOMY     as child  . TUBAL LIGATION     29yrs ago  . VAGINAL HYSTERECTOMY     78yrs ago d/t cervical cancer    There were no vitals filed for this visit.      Subjective Assessment - 03/22/17 1534    Subjective Patient has very sore knees today and is not able to do the walking  part of the LSVT BIG today.    Pertinent History Patient was Dx 6 months ago and she feels that she is walking slower at home. She is having tremors with right side > left side She continues to be active and has been a little slower the past few days. She had B 12 shots 3 monts ago and now is feeling better.    How long can you sit comfortably? unlimited   How long can you stand comfortably? 30 minutes   How long can you walk comfortably? 30 minutes   Diagnostic tests MRI   Patient Stated Goals to walk better   Currently in Pain? Yes   Pain Score 6    Pain Location Knee   Pain Orientation Right;Left   Pain Descriptors / Indicators Aching;Stabbing   Pain Type Chronic pain   Pain Onset More than a month ago   Pain Frequency Intermittent   Aggravating Factors  walking   Pain Relieving Factors shots   Multiple Pain Sites No      Treatment Patient seen for LSVT Daily Session Maximal Daily Exercises for facilitation/coordination of movement Sustained movements are designed to rescale the amplitude of movement output for generalization to daily functional  activities .Performed as follows for 1 set of 10 repetitions each multidirectional sustained movements  1) Floor to ceiling  2) Side to side multidirectional Repetitive movements performed in standing and are designed to provide retraining effort needed for sustained muscle activation in tasks  3) Step and reach forward  4) Step and reach backwards  5) Step and reach sideways 6) Rock and reach forward/backward  7) Rock and reach sideways Sit to stand functional component task with supervision 5 reps and simulated activities for: hand coordination tasks such as putting on toothpaste and burshing her teeth,using the toothbrush to brush her teeth with good motor control and coordination, balance tasks for higher level reaching and weight shifting with min assist using blue foam and cones to challenge her dynamic standing balance.  NU-step x  10 mins with MH to back and ice to knees during coordination activities.                             PT Education - 03/22/17 1536    Education provided Yes   Education Details LSVT BIG   Person(s) Educated Patient   Methods Explanation   Comprehension Verbalized understanding             PT Long Term Goals - 03/14/17 0939      PT LONG TERM GOAL #1   Title Patient will be independent in home exercise program to improve strength/mobility for better functional independence with ADLs.   Time 4   Period Weeks   Status New     PT LONG TERM GOAL #2   Title Patient (> 12 years old) will complete five times sit to stand test in < 15 seconds indicating an increased LE strength and improved balance   Time 4   Period Weeks   Status New     PT LONG TERM GOAL #3   Title Patient will increase six minute walk test distance to >1000 for progression to community ambulator and improve gait ability   Time 4   Period Weeks   Status New     PT LONG TERM GOAL #4   Title Patient will reduce timed up and go to <11 seconds to reduce fall risk and demonstrate improved transfer/gait ability.   Time 4   Period Weeks   Status New     PT LONG TERM GOAL #5   Title Patient will ascend/descend 4 stairs without rail assist independently without loss of balance to improve ability to get in/out of home.    Time 4   Period Weeks   Status New               Plan - 03/22/17 1537    Clinical Impression Statement Patient has unsteady stepping with several exercises but  motor control improve with practice. Patient needs occasional verbal cueing to improve posture and cueing to correctly perform exercises slowly, holding at end of range to increase motor firing of desired muscle to encourage fatigue.Marland Kitchen Pt encouraged to continue HEP and follow-up as scheduled.   Rehab Potential Good   Clinical Impairments Affecting Rehab Potential This patient presents with 2, personal factors/  comorbidities she lives alone and current situtation, and 3  body elements including body structures and functions, activity limitations and or participation restrictions including decreased gait and decreased standing balance, falls risk. Patient's condition is  evolving   PT Frequency 4x / week   PT Duration 4 weeks  PT Treatment/Interventions Gait training;Neuromuscular re-education;Balance training;Therapeutic exercise;Therapeutic activities;Patient/family education   PT Next Visit Plan LSVT BIG   PT Home Exercise Plan LSVT BIG   Consulted and Agree with Plan of Care Patient      Patient will benefit from skilled therapeutic intervention in order to improve the following deficits and impairments:  Abnormal gait, Decreased balance, Decreased endurance, Decreased mobility, Difficulty walking, Decreased activity tolerance, Decreased safety awareness, Decreased strength, Pain, Decreased cognition  Visit Diagnosis: Difficulty in walking, not elsewhere classified  Muscle weakness (generalized)     Problem List Patient Active Problem List   Diagnosis Date Noted  . Family history of Cushing disease 01/12/2017  . B12 deficiency 12/18/2016  . Mild cognitive impairment with memory loss 12/18/2016  . Parkinsonian features 12/18/2016  . Vitamin D deficiency 12/18/2016  . Chalazion, bilateral 06/08/2016  . Absolute anemia 07/30/2015  . Edema extremities 07/30/2015  . Benign essential tremor 07/30/2015  . Anxiety, generalized 07/30/2015  . Gastro-esophageal reflux disease without esophagitis 07/30/2015  . HLD (hyperlipidemia) 07/30/2015  . Cannot sleep 07/30/2015  . Lumbar radiculopathy 07/30/2015  . Depression, major, recurrent, in partial remission ([place]) 07/30/2015  . Arthritis, degenerative 07/30/2015  . Allergic rhinitis 07/30/2015  . Gastroduodenal ulcer 07/30/2015  . Calcium blood increased 07/30/2015  . Pre-syncope 05/17/2015  . Dyspnea 03/31/2015  . Essential hypertension  03/31/2015  . Bleeding stomach ulcer 10/14/2013  . Anemia, iron deficiency 10/14/2013  . Weakness generalized 10/14/2013  . Ejection fraction   . Pericardial effusion   . PONV (postoperative nausea and vomiting)   . Palpitations   . Low back pain   . Normal nuclear stress test    Alanson Puls, PT, DPT Arelia Sneddon S 03/22/2017, 3:41 PM  [place] MAIN [facility] SERVICES [address] [place], [place], [address] Phone: [phone]   Fax:  [phone]  Name: Nancy Blackburn MRN: [id number] Date of Birth: July 18, 1946

## 2017-03-23 ENCOUNTER — Ambulatory Visit: Payer: Medicare Other | Admitting: Physical Therapy

## 2017-03-23 ENCOUNTER — Encounter: Payer: Self-pay | Admitting: Physical Therapy

## 2017-03-23 DIAGNOSIS — R262 Difficulty in walking, not elsewhere classified: Secondary | ICD-10-CM

## 2017-03-23 DIAGNOSIS — M6281 Muscle weakness (generalized): Secondary | ICD-10-CM

## 2017-03-23 NOTE — Therapy (Signed)
[place] MAIN [facility] SERVICES [address] [place], [place], [address] Phone: [phone]   Fax:  [phone]  Physical Therapy Treatment  Patient Details  Name: Nancy Blackburn MRN: [id number] Date of Birth: 26-Oct-1946 Referring Provider: Jennings Books K  Encounter Date: 03/23/2017      PT End of Session - 03/23/17 1504    Visit Number 8   Number of Visits 17   Date for PT Re-Evaluation 28-Apr-2017   Authorization Type g codes 8/10   PT Start Time 1500   PT Stop Time 1600   PT Time Calculation (min) 60 min   Equipment Utilized During Treatment Gait belt   Activity Tolerance Patient tolerated treatment well;Patient limited by fatigue   Behavior During Therapy [facility] for tasks assessed/performed      Past Medical History:  Diagnosis Date  . Chronic sinus infection   . Ejection fraction    a. EF 55-65%, echo, mild LVH, June, 2013, small pericardial effusion  . Hyperlipidemia    a. takes Simvastatin  . Hypertension    a. takes Metoprolol, Lisinopril, and amlodipine.  . Low back pain   . Mild dementia   . Nasal polyps   . Normal nuclear stress test    a. 07/2005 Nl nuc stress test.  . Palpitations    a. June 2013:  Event recorder normal sinus rhythm with rare PVCs - ? symptomatic PVC's.  . Parkinson disease ([place])   . Pericardial effusion    a. Small, echo, June, 2013  . PONV (postoperative nausea and vomiting)     Past Surgical History:  Procedure Laterality Date  . BACK SURGERY  08/2010  . BLADDER SUSPENSION  2003  . bone spur  2009   removed from right collar bone area and partial collar bone removed  . THYROIDECTOMY, PARTIAL    . TONSILLECTOMY     as child  . TUBAL LIGATION     84yrs ago  . VAGINAL HYSTERECTOMY     45yrs ago d/t cervical cancer    There were no vitals filed for this visit.      Subjective Assessment - 03/23/17 1502    Subjective Patient has sore knees today, but she is better than yesterday,   and is not able to do the walking part of the LSVT BIG today.    Pertinent History Patient was Dx 6 months ago and she feels that she is walking slower at home. She is having tremors with right side > left side She continues to be active and has been a little slower the past few days. She had B 12 shots 3 monts ago and now is feeling better.    How long can you sit comfortably? unlimited   How long can you stand comfortably? 30 minutes   How long can you walk comfortably? 30 minutes   Diagnostic tests MRI   Patient Stated Goals to walk better   Currently in Pain? Yes   Pain Score 2    Pain Location Knee   Pain Orientation Right;Left   Pain Descriptors / Indicators Aching   Pain Type Chronic pain   Pain Onset More than a month ago   Pain Frequency Intermittent   Aggravating Factors  walking   Pain Relieving Factors shots, rest, ice   Effect of Pain on Daily Activities unable to walk or do activities   Multiple Pain Sites No      Treatment Patient seen for LSVT Daily Session Maximal  Daily Exercises for facilitation/coordination of movement Sustained movements are designed to rescale the amplitude of movement output for generalization to daily functional activities .Performed as follows for 1 set of 10 repetitions each multidirectional sustained movements  1) Floor to ceiling  2) Side to side multidirectional Repetitive movements performed in standing and are designed to provide retraining effort needed for sustained muscle activation in tasks  3) Step and reach forward  4) Step and reach backwards  5) Step and reach sideways 6) Rock and reach forward/backward  7) Rock and reach sideways Sit to stand functional component task with supervision 5 reps and simulated activities for: hand coordination tasks such as putting on toothpaste and burshing her teeth,using the toothbrush to brush her teeth with good motor control and coordination, balance tasks for higher level reaching and weight  shifting with min assist using blue foam and cones to challenge her dynamic standing balance.  NU-step x 10 mins with MH to back and ice to knees during coordination activities.                             PT Education - 03/23/17 1503    Education provided Yes   Education Details LSVT BIG   Person(s) Educated Patient   Methods Explanation   Comprehension Verbalized understanding             PT Long Term Goals - 03/14/17 0939      PT LONG TERM GOAL #1   Title Patient will be independent in home exercise program to improve strength/mobility for better functional independence with ADLs.   Time 4   Period Weeks   Status New     PT LONG TERM GOAL #2   Title Patient (> 90 years old) will complete five times sit to stand test in < 15 seconds indicating an increased LE strength and improved balance   Time 4   Period Weeks   Status New     PT LONG TERM GOAL #3   Title Patient will increase six minute walk test distance to >1000 for progression to community ambulator and improve gait ability   Time 4   Period Weeks   Status New     PT LONG TERM GOAL #4   Title Patient will reduce timed up and go to <11 seconds to reduce fall risk and demonstrate improved transfer/gait ability.   Time 4   Period Weeks   Status New     PT LONG TERM GOAL #5   Title Patient will ascend/descend 4 stairs without rail assist independently without loss of balance to improve ability to get in/out of home.    Time 4   Period Weeks   Status New               Plan - 03/23/17 1504    Clinical Impression Statement Patient responds well to verbal and tactile cues to correct form and technique.  CGA to SBA for safety with activities.  Cues to increase intensity and amplitude of movements throughout session. Pt encouraged to continue HEP and follow-up as scheduled.   Rehab Potential Good   Clinical Impairments Affecting Rehab Potential This patient presents with 2, personal  factors/ comorbidities she lives alone and current situtation, and 3  body elements including body structures and functions, activity limitations and or participation restrictions including decreased gait and decreased standing balance, falls risk. Patient's condition is  evolving   PT Frequency  4x / week   PT Duration 4 weeks   PT Treatment/Interventions Gait training;Neuromuscular re-education;Balance training;Therapeutic exercise;Therapeutic activities;Patient/family education   PT Next Visit Plan LSVT BIG   PT Home Exercise Plan LSVT BIG   Consulted and Agree with Plan of Care Patient      Patient will benefit from skilled therapeutic intervention in order to improve the following deficits and impairments:  Abnormal gait, Decreased balance, Decreased endurance, Decreased mobility, Difficulty walking, Decreased activity tolerance, Decreased safety awareness, Decreased strength, Pain, Decreased cognition  Visit Diagnosis: Difficulty in walking, not elsewhere classified  Muscle weakness (generalized)     Problem List Patient Active Problem List   Diagnosis Date Noted  . Family history of Cushing disease 01/12/2017  . B12 deficiency 12/18/2016  . Mild cognitive impairment with memory loss 12/18/2016  . Parkinsonian features 12/18/2016  . Vitamin D deficiency 12/18/2016  . Chalazion, bilateral 06/08/2016  . Absolute anemia 07/30/2015  . Edema extremities 07/30/2015  . Benign essential tremor 07/30/2015  . Anxiety, generalized 07/30/2015  . Gastro-esophageal reflux disease without esophagitis 07/30/2015  . HLD (hyperlipidemia) 07/30/2015  . Cannot sleep 07/30/2015  . Lumbar radiculopathy 07/30/2015  . Depression, major, recurrent, in partial remission ([place]) 07/30/2015  . Arthritis, degenerative 07/30/2015  . Allergic rhinitis 07/30/2015  . Gastroduodenal ulcer 07/30/2015  . Calcium blood increased 07/30/2015  . Pre-syncope 05/17/2015  . Dyspnea 03/31/2015  . Essential  hypertension 03/31/2015  . Bleeding stomach ulcer 10/14/2013  . Anemia, iron deficiency 10/14/2013  . Weakness generalized 10/14/2013  . Ejection fraction   . Pericardial effusion   . PONV (postoperative nausea and vomiting)   . Palpitations   . Low back pain   . Normal nuclear stress test    Alanson Puls, PT, DPT Arelia Sneddon S 03/23/2017, 3:06 PM  [place] MAIN [facility] SERVICES [address] [place], [place], [address] Phone: [phone]   Fax:  [phone]  Name: Nancy Blackburn MRN: [id number] Date of Birth: 06-18-1946

## 2017-03-27 ENCOUNTER — Ambulatory Visit: Payer: Medicare Other | Admitting: Physical Therapy

## 2017-03-28 ENCOUNTER — Ambulatory Visit: Payer: Medicare Other | Admitting: Physical Therapy

## 2017-03-28 ENCOUNTER — Encounter: Payer: Self-pay | Admitting: Physical Therapy

## 2017-03-28 DIAGNOSIS — M6281 Muscle weakness (generalized): Secondary | ICD-10-CM

## 2017-03-28 DIAGNOSIS — R262 Difficulty in walking, not elsewhere classified: Secondary | ICD-10-CM | POA: Diagnosis not present

## 2017-03-28 NOTE — Therapy (Signed)
[place] MAIN [facility] SERVICES [address] [place], [place], [address] Phone: [phone]   Fax:  [phone]  Physical Therapy Treatment  Patient Details  Name: Nancy Blackburn MRN: [id number] Date of Birth: 06-Dec-1946 Referring Provider: Jennings Books K  Encounter Date: 03/28/2017      PT End of Session - 03/28/17 1455    Visit Number 9   Number of Visits 17   Date for PT Re-Evaluation 04-24-17   Authorization Type g codes Aug 29, 2023   PT Start Time 1450   PT Stop Time 1545   PT Time Calculation (min) 55 min   Equipment Utilized During Treatment Gait belt   Activity Tolerance Patient tolerated treatment well;Patient limited by fatigue   Behavior During Therapy [facility] for tasks assessed/performed      Past Medical History:  Diagnosis Date  . Chronic sinus infection   . Ejection fraction    a. EF 55-65%, echo, mild LVH, June, 2013, small pericardial effusion  . Hyperlipidemia    a. takes Simvastatin  . Hypertension    a. takes Metoprolol, Lisinopril, and amlodipine.  . Low back pain   . Mild dementia   . Nasal polyps   . Normal nuclear stress test    a. 07/2005 Nl nuc stress test.  . Palpitations    a. June 2013:  Event recorder normal sinus rhythm with rare PVCs - ? symptomatic PVC's.  . Parkinson disease ([place])   . Pericardial effusion    a. Small, echo, June, 2013  . PONV (postoperative nausea and vomiting)     Past Surgical History:  Procedure Laterality Date  . BACK SURGERY  08/2010  . BLADDER SUSPENSION  2003  . bone spur  2009   removed from right collar bone area and partial collar bone removed  . THYROIDECTOMY, PARTIAL    . TONSILLECTOMY     as child  . TUBAL LIGATION     36yrs ago  . VAGINAL HYSTERECTOMY     41yrs ago d/t cervical cancer    There were no vitals filed for this visit.      Subjective Assessment - 03/28/17 1454    Subjective Patient has sore knees today, but she is better than last week.   Pertinent History Patient was Dx 6 months ago and she feels that she is walking slower at home. She is having tremors with right side > left side She continues to be active and has been a little slower the past few days. She had B 12 shots 3 monts ago and now is feeling better.    How long can you sit comfortably? unlimited   How long can you stand comfortably? 30 minutes   How long can you walk comfortably? 30 minutes   Diagnostic tests MRI   Patient Stated Goals to walk better   Currently in Pain? Yes   Pain Score 4    Pain Location Knee   Pain Orientation Right;Left   Pain Descriptors / Indicators Aching   Pain Type Chronic pain   Pain Onset More than a month ago   Multiple Pain Sites No       Treatment Patient seen for LSVT Daily Session Maximal Daily Exercises for facilitation/coordination of movement Sustained movements are designed to rescale the amplitude of movement output for generalization to daily functional activities .Performed as follows for 1 set of 10 repetitions each multidirectional sustained movements  1) Floor to ceiling  2) Side to side multidirectional Repetitive  movements performed in standing and are designed to provide retraining effort needed for sustained muscle activation in tasks  3) Step and reach forward  4) Step and reach backwards  5) Step and reach sideways 6) Rock and reach forward/backward  7) Rock and reach sideways Sit to stand functional component task with supervision 5 reps and simulated activities for: hand coordination tasks such as putting on toothpaste and burshing her teeth,using the toothbrush to brush her teeth with good motor control and coordination, balance tasks for higher level reaching and weight shifting with min assist using blue foam and cones to challenge her dynamic standing balance.  NU-step x 10 mins with MH to back and ice to knees during coordination activities.                           PT  Education - 03/28/17 1455    Education provided Yes   Education Details LSVT BIG   Person(s) Educated Patient   Methods Explanation   Comprehension Verbalized understanding             PT Long Term Goals - 03/14/17 0939      PT LONG TERM GOAL #1   Title Patient will be independent in home exercise program to improve strength/mobility for better functional independence with ADLs.   Time 4   Period Weeks   Status New     PT LONG TERM GOAL #2   Title Patient (> 5 years old) will complete five times sit to stand test in < 15 seconds indicating an increased LE strength and improved balance   Time 4   Period Weeks   Status New     PT LONG TERM GOAL #3   Title Patient will increase six minute walk test distance to >1000 for progression to community ambulator and improve gait ability   Time 4   Period Weeks   Status New     PT LONG TERM GOAL #4   Title Patient will reduce timed up and go to <11 seconds to reduce fall risk and demonstrate improved transfer/gait ability.   Time 4   Period Weeks   Status New     PT LONG TERM GOAL #5   Title Patient will ascend/descend 4 stairs without rail assist independently without loss of balance to improve ability to get in/out of home.    Time 4   Period Weeks   Status New               Plan - 03/28/17 1456    Clinical Impression Statement vContinues to have balance deficits typical with diagnosis. Patient performs intermediate level exercises without pain behaviors and needs verbal cuing for postural alignment and head positioning.. Patient will benefit from further skilled therapy focused on improving strength and balance to return to prior level of function   Rehab Potential Good   Clinical Impairments Affecting Rehab Potential This patient presents with 2, personal factors/ comorbidities she lives alone and current situtation, and 3  body elements including body structures and functions, activity limitations and or  participation restrictions including decreased gait and decreased standing balance, falls risk. Patient's condition is  evolving   PT Frequency 4x / week   PT Duration 4 weeks   PT Treatment/Interventions Gait training;Neuromuscular re-education;Balance training;Therapeutic exercise;Therapeutic activities;Patient/family education   PT Next Visit Plan LSVT BIG   PT Home Exercise Plan LSVT BIG   Consulted and Agree with Plan of Care  Patient      Patient will benefit from skilled therapeutic intervention in order to improve the following deficits and impairments:  Abnormal gait, Decreased balance, Decreased endurance, Decreased mobility, Difficulty walking, Decreased activity tolerance, Decreased safety awareness, Decreased strength, Pain, Decreased cognition  Visit Diagnosis: Difficulty in walking, not elsewhere classified  Muscle weakness (generalized)     Problem List Patient Active Problem List   Diagnosis Date Noted  . Family history of Cushing disease 01/12/2017  . B12 deficiency 12/18/2016  . Mild cognitive impairment with memory loss 12/18/2016  . Parkinsonian features 12/18/2016  . Vitamin D deficiency 12/18/2016  . Chalazion, bilateral 06/08/2016  . Absolute anemia 07/30/2015  . Edema extremities 07/30/2015  . Benign essential tremor 07/30/2015  . Anxiety, generalized 07/30/2015  . Gastro-esophageal reflux disease without esophagitis 07/30/2015  . HLD (hyperlipidemia) 07/30/2015  . Cannot sleep 07/30/2015  . Lumbar radiculopathy 07/30/2015  . Depression, major, recurrent, in partial remission ([place]) 07/30/2015  . Arthritis, degenerative 07/30/2015  . Allergic rhinitis 07/30/2015  . Gastroduodenal ulcer 07/30/2015  . Calcium blood increased 07/30/2015  . Pre-syncope 05/17/2015  . Dyspnea 03/31/2015  . Essential hypertension 03/31/2015  . Bleeding stomach ulcer 10/14/2013  . Anemia, iron deficiency 10/14/2013  . Weakness generalized 10/14/2013  . Ejection fraction    . Pericardial effusion   . PONV (postoperative nausea and vomiting)   . Palpitations   . Low back pain   . Normal nuclear stress test    Alanson Puls, PT, DPT Arelia Sneddon S 03/28/2017, 2:58 PM  [place] MAIN [facility] SERVICES [address] [place], [place], [address] Phone: [phone]   Fax:  [phone]  Name: Nancy Blackburn MRN: [id number] Date of Birth: 06/10/1946

## 2017-03-29 ENCOUNTER — Encounter: Payer: Self-pay | Admitting: Physical Therapy

## 2017-03-29 ENCOUNTER — Ambulatory Visit: Payer: Medicare Other | Admitting: Physical Therapy

## 2017-03-29 DIAGNOSIS — R262 Difficulty in walking, not elsewhere classified: Secondary | ICD-10-CM

## 2017-03-29 DIAGNOSIS — M6281 Muscle weakness (generalized): Secondary | ICD-10-CM

## 2017-03-29 NOTE — Therapy (Signed)
[place] MAIN [facility] SERVICES [address] [place], [place], [address] Phone: [phone]   Fax:  [phone]  Physical Therapy Treatment/ Progress note   Patient Details  Name: Nancy Blackburn MRN: [id number] Date of Birth: 09/19/1946 Referring Provider: Jennings Books K  Encounter Date: 03/29/2017      PT End of Session - 03/29/17 1514    Visit Number 10   Number of Visits 17   Date for PT Re-Evaluation 2017-05-04   Authorization Type g codes 10/10   PT Start Time 1505   PT Stop Time 1600   PT Time Calculation (min) 55 min   Equipment Utilized During Treatment Gait belt   Activity Tolerance Patient tolerated treatment well;Patient limited by fatigue   Behavior During Therapy WFL for tasks assessed/performed      Past Medical History:  Diagnosis Date  . Chronic sinus infection   . Ejection fraction    a. EF 55-65%, echo, mild LVH, June, 2013, small pericardial effusion  . Hyperlipidemia    a. takes Simvastatin  . Hypertension    a. takes Metoprolol, Lisinopril, and amlodipine.  . Low back pain   . Mild dementia   . Nasal polyps   . Normal nuclear stress test    a. 07/2005 Nl nuc stress test.  . Palpitations    a. June 2013:  Event recorder normal sinus rhythm with rare PVCs - ? symptomatic PVC's.  . Parkinson disease ([place])   . Pericardial effusion    a. Small, echo, June, 2013  . PONV (postoperative nausea and vomiting)     Past Surgical History:  Procedure Laterality Date  . BACK SURGERY  08/2010  . BLADDER SUSPENSION  2003  . bone spur  2009   removed from right collar bone area and partial collar bone removed  . THYROIDECTOMY, PARTIAL    . TONSILLECTOMY     as child  . TUBAL LIGATION     29yr ago  . VAGINAL HYSTERECTOMY     89yrago d/t cervical cancer    There were no vitals filed for this visit.      Subjective Assessment - 03/29/17 1512    Subjective Patient has sore knees today, but she is better  than last week.   Pertinent History Patient was Dx 6 months ago and she feels that she is walking slower at home. She is having tremors with right side > left side She continues to be active and has been a little slower the past few days. She had B 12 shots 3 monts ago and now is feeling better.    How long can you sit comfortably? unlimited   How long can you stand comfortably? 30 minutes   How long can you walk comfortably? 30 minutes   Diagnostic tests MRI   Patient Stated Goals to walk better   Currently in Pain? Yes   Pain Score 1    Pain Location Knee   Pain Orientation Left   Pain Descriptors / Indicators Sore   Pain Type Chronic pain   Pain Onset More than a month ago   Multiple Pain Sites No       Treatment Patient seen for LSVT Daily Session Maximal Daily Exercises for facilitation/coordination of movement Sustained movements are designed to rescale the amplitude of movement output for generalization to daily functional activities .Performed as follows for 1 set of 10 repetitions each multidirectional sustained movements  1) Floor to ceiling  2) Side  to side multidirectional Repetitive movements performed in standing and are designed to provide retraining effort needed for sustained muscle activation in tasks  3) Step and reach forward  4) Step and reach backwards  5) Step and reach sideways 6) Rock and reach forward/backward  7) Rock and reach sideways Sit to stand functional component task with supervision 5 reps and simulated activities for: hand coordination tasks such as putting on toothpaste and burshing her teeth,using the toothbrush to brush her teeth with good motor control and coordination, balance tasks for higher level reaching and weight shifting with min assist using blue foam and cones to challenge her dynamic standing balance.  NU-step x 10 mins with MH to back and ice to knees during coordination activities.  Outcome measures were performed and goals #      OUTCOME MEASURES: TEST Outcome Interpretation  5 times sit<>stand 13.76 sec >60 yo, >15 sec indicates increased risk for falls  10 meter walk test   1.30              m/s <1.0 m/s indicates increased risk for falls; limited community ambulator  Timed up and Go    7.76          sec <14 sec indicates increased risk for falls  6 minute walk test  1200            Feet 1000 feet is community ambulator  BERG [phone] 56/56 is normal                                PT Education - 03/29/17 1513    Education provided Yes   Education Details LSVT BIG   Person(s) Educated Patient   Methods Explanation   Comprehension Verbalized understanding             PT Long Term Goals - 03/29/17 1519      PT LONG TERM GOAL #1   Title Patient will be independent in home exercise program to improve strength/mobility for better functional independence with ADLs.   Time 4   Period Weeks   Status On-going     PT LONG TERM GOAL #2   Title Patient (> 44 years old) will complete five times sit to stand test in < 15 seconds indicating an increased LE strength and improved balance   Baseline 13.73 sec   Time 4   Period Weeks   Status Partially Met     PT LONG TERM GOAL #3   Title Patient will increase six minute walk test distance to >1000 for progression to community ambulator and improve gait ability   Baseline 1200 feet   Time 4   Period Weeks   Status Partially Met     PT LONG TERM GOAL #4   Title Patient will reduce timed up and go to <11 seconds to reduce fall risk and demonstrate improved transfer/gait ability.   Baseline 7.76 sec   Time 4   Period Weeks   Status Partially Met     PT LONG TERM GOAL #5   Title Patient will ascend/descend 4 stairs without rail assist independently without loss of balance to improve ability to get in/out of home.    Time 4   Period Weeks   Status Partially Met               Plan - 03/29/17 1514    Clinical Impression  Statement Patient performs outcome  measures today and has made progress towards goals. She performs LSVT BIG exercises and needs verbal cues for turning her head and getting a full rotation with twist exercise. She will continue to benefit from skilled PT to improve balance and safety.    Rehab Potential Good   Clinical Impairments Affecting Rehab Potential This patient presents with 2, personal factors/ comorbidities she lives alone and current situtation, and 3  body elements including body structures and functions, activity limitations and or participation restrictions including decreased gait and decreased standing balance, falls risk. Patient's condition is  evolving   PT Frequency 4x / week   PT Duration 4 weeks   PT Treatment/Interventions Gait training;Neuromuscular re-education;Balance training;Therapeutic exercise;Therapeutic activities;Patient/family education   PT Next Visit Plan LSVT BIG   PT Home Exercise Plan LSVT BIG   Consulted and Agree with Plan of Care Patient      Patient will benefit from skilled therapeutic intervention in order to improve the following deficits and impairments:  Abnormal gait, Decreased balance, Decreased endurance, Decreased mobility, Difficulty walking, Decreased activity tolerance, Decreased safety awareness, Decreased strength, Pain, Decreased cognition  Visit Diagnosis: Difficulty in walking, not elsewhere classified  Muscle weakness (generalized)     Problem List Patient Active Problem List   Diagnosis Date Noted  . Family history of Cushing disease 01/12/2017  . B12 deficiency 12/18/2016  . Mild cognitive impairment with memory loss 12/18/2016  . Parkinsonian features 12/18/2016  . Vitamin D deficiency 12/18/2016  . Chalazion, bilateral 06/08/2016  . Absolute anemia 07/30/2015  . Edema extremities 07/30/2015  . Benign essential tremor 07/30/2015  . Anxiety, generalized 07/30/2015  . Gastro-esophageal reflux disease without esophagitis  07/30/2015  . HLD (hyperlipidemia) 07/30/2015  . Cannot sleep 07/30/2015  . Lumbar radiculopathy 07/30/2015  . Depression, major, recurrent, in partial remission ([place]) 07/30/2015  . Arthritis, degenerative 07/30/2015  . Allergic rhinitis 07/30/2015  . Gastroduodenal ulcer 07/30/2015  . Calcium blood increased 07/30/2015  . Pre-syncope 05/17/2015  . Dyspnea 03/31/2015  . Essential hypertension 03/31/2015  . Bleeding stomach ulcer 10/14/2013  . Anemia, iron deficiency 10/14/2013  . Weakness generalized 10/14/2013  . Ejection fraction   . Pericardial effusion   . PONV (postoperative nausea and vomiting)   . Palpitations   . Low back pain   . Normal nuclear stress test    Alanson Puls, PT, DPT Arelia Sneddon S 03/29/2017, 3:35 PM  [place] MAIN [facility] SERVICES [address] [place], [place], [address] Phone: [phone]   Fax:  [phone]  Name: Nancy Blackburn MRN: [id number] Date of Birth: Mar 13, 1946

## 2017-03-30 ENCOUNTER — Encounter: Payer: Self-pay | Admitting: Physical Therapy

## 2017-03-30 ENCOUNTER — Ambulatory Visit: Payer: Medicare Other | Admitting: Physical Therapy

## 2017-03-30 DIAGNOSIS — R262 Difficulty in walking, not elsewhere classified: Secondary | ICD-10-CM | POA: Diagnosis not present

## 2017-03-30 DIAGNOSIS — E538 Deficiency of other specified B group vitamins: Secondary | ICD-10-CM | POA: Diagnosis not present

## 2017-03-30 DIAGNOSIS — N3946 Mixed incontinence: Secondary | ICD-10-CM | POA: Diagnosis not present

## 2017-03-30 DIAGNOSIS — R3915 Urgency of urination: Secondary | ICD-10-CM | POA: Diagnosis not present

## 2017-03-30 DIAGNOSIS — M6281 Muscle weakness (generalized): Secondary | ICD-10-CM

## 2017-03-30 NOTE — Therapy (Signed)
[place] MAIN [facility] SERVICES [address] [place], [place], [address] Phone: [phone]   Fax:  [phone]  Physical Therapy Treatment  Patient Details  Name: Nancy Blackburn MRN: [id number] Date of Birth: 09-06-1946 Referring Provider: Jennings Books K  Encounter Date: 03/30/2017      PT End of Session - 03/30/17 1506    Visit Number 11   Number of Visits 17   Date for PT Re-Evaluation 2017/05/12   Authorization Type g codes Nov 16, 2023   PT Start Time 1500   PT Stop Time 1600   PT Time Calculation (min) 60 min   Equipment Utilized During Treatment Gait belt   Activity Tolerance Patient tolerated treatment well;Patient limited by fatigue   Behavior During Therapy [facility] for tasks assessed/performed      Past Medical History:  Diagnosis Date  . Chronic sinus infection   . Ejection fraction    a. EF 55-65%, echo, mild LVH, June, 2013, small pericardial effusion  . Hyperlipidemia    a. takes Simvastatin  . Hypertension    a. takes Metoprolol, Lisinopril, and amlodipine.  . Low back pain   . Mild dementia   . Nasal polyps   . Normal nuclear stress test    a. 07/2005 Nl nuc stress test.  . Palpitations    a. June 2013:  Event recorder normal sinus rhythm with rare PVCs - ? symptomatic PVC's.  . Parkinson disease ([place])   . Pericardial effusion    a. Small, echo, June, 2013  . PONV (postoperative nausea and vomiting)     Past Surgical History:  Procedure Laterality Date  . BACK SURGERY  08/2010  . BLADDER SUSPENSION  2003  . bone spur  2009   removed from right collar bone area and partial collar bone removed  . THYROIDECTOMY, PARTIAL    . TONSILLECTOMY     as child  . TUBAL LIGATION     26yr ago  . VAGINAL HYSTERECTOMY     89yrago d/t cervical cancer    There were no vitals filed for this visit.      Subjective Assessment - 03/30/17 1504    Subjective Patient has sore knees today, but she is better than last week.    Pertinent History Patient was Dx 6 months ago and she feels that she is walking slower at home. She is having tremors with right side > left side She continues to be active and has been a little slower the past few days. She had B 12 shots 3 monts ago and now is feeling better.    How long can you sit comfortably? unlimited   How long can you stand comfortably? 30 minutes   How long can you walk comfortably? 30 minutes   Diagnostic tests MRI   Patient Stated Goals to walk better   Currently in Pain? Yes   Pain Score 6    Pain Location Knee   Pain Orientation Left   Pain Descriptors / Indicators Aching   Pain Type Chronic pain   Pain Onset More than a month ago   Pain Frequency Intermittent   Aggravating Factors  walking   Pain Relieving Factors shots   Effect of Pain on Daily Activities unable to walk or do activities   Multiple Pain Sites No        Treatment Patient seen for LSVT Daily Session Maximal Daily Exercises for facilitation/coordination of movement Sustained movements are designed to rescale the amplitude of  movement output for generalization to daily functional activities .Performed as follows for 1 set of 10 repetitions each multidirectional sustained movements  1) Floor to ceiling  2) Side to side multidirectional Repetitive movements performed in standing and are designed to provide retraining effort needed for sustained muscle activation in tasks  3) Step and reach forward  4) Step and reach backwards  5) Step and reach sideways 6) Rock and reach forward/backward  7) Rock and reach sideways Sit to stand functional component task with supervision 5 reps and simulated activities for: hand coordination tasks such as putting on toothpaste and burshing her teeth,using the toothbrush to brush her teeth with good motor control and coordination, balance tasks for higher level reaching and weight shifting with min assist using blue foam and cones to challenge her dynamic  standing balance.  NU-step x 10 mins with MH to back and ice to knees during coordination activities.                          PT Education - 03/30/17 1505    Education provided Yes   Education Details LSVT BIG   Person(s) Educated Patient   Methods Explanation   Comprehension Verbalized understanding             PT Long Term Goals - 03/29/17 1519      PT LONG TERM GOAL #1   Title Patient will be independent in home exercise program to improve strength/mobility for better functional independence with ADLs.   Time 4   Period Weeks   Status On-going     PT LONG TERM GOAL #2   Title Patient (> 61 years old) will complete five times sit to stand test in < 15 seconds indicating an increased LE strength and improved balance   Baseline 13.73 sec   Time 4   Period Weeks   Status Partially Met     PT LONG TERM GOAL #3   Title Patient will increase six minute walk test distance to >1000 for progression to community ambulator and improve gait ability   Baseline 1200 feet   Time 4   Period Weeks   Status Partially Met     PT LONG TERM GOAL #4   Title Patient will reduce timed up and go to <11 seconds to reduce fall risk and demonstrate improved transfer/gait ability.   Baseline 7.76 sec   Time 4   Period Weeks   Status Partially Met     PT LONG TERM GOAL #5   Title Patient will ascend/descend 4 stairs without rail assist independently without loss of balance to improve ability to get in/out of home.    Time 4   Period Weeks   Status Partially Met               Plan - 03/30/17 1506    Clinical Impression Statement Min cueing needed to appropriately perform LSVT tasks with leg, hand, and head position. Decreased coordination demonstrated requiring consistent verbal cueing to correct formPt tolerated well but continues to demonstrate posterior LOB and poor balance strategies and postural control on compliant surfaces; will continue to address.    Rehab Potential Good   Clinical Impairments Affecting Rehab Potential This patient presents with 2, personal factors/ comorbidities she lives alone and current situtation, and 3  body elements including body structures and functions, activity limitations and or participation restrictions including decreased gait and decreased standing balance, falls risk. Patient's condition is  evolving   PT Frequency 4x / week   PT Duration 4 weeks   PT Treatment/Interventions Gait training;Neuromuscular re-education;Balance training;Therapeutic exercise;Therapeutic activities;Patient/family education   PT Next Visit Plan LSVT BIG   PT Home Exercise Plan LSVT BIG   Consulted and Agree with Plan of Care Patient      Patient will benefit from skilled therapeutic intervention in order to improve the following deficits and impairments:  Abnormal gait, Decreased balance, Decreased endurance, Decreased mobility, Difficulty walking, Decreased activity tolerance, Decreased safety awareness, Decreased strength, Pain, Decreased cognition  Visit Diagnosis: Difficulty in walking, not elsewhere classified  Muscle weakness (generalized)     Problem List Patient Active Problem List   Diagnosis Date Noted  . Family history of Cushing disease 01/12/2017  . B12 deficiency 12/18/2016  . Mild cognitive impairment with memory loss 12/18/2016  . Parkinsonian features 12/18/2016  . Vitamin D deficiency 12/18/2016  . Chalazion, bilateral 06/08/2016  . Absolute anemia 07/30/2015  . Edema extremities 07/30/2015  . Benign essential tremor 07/30/2015  . Anxiety, generalized 07/30/2015  . Gastro-esophageal reflux disease without esophagitis 07/30/2015  . HLD (hyperlipidemia) 07/30/2015  . Cannot sleep 07/30/2015  . Lumbar radiculopathy 07/30/2015  . Depression, major, recurrent, in partial remission ([place]) 07/30/2015  . Arthritis, degenerative 07/30/2015  . Allergic rhinitis 07/30/2015  . Gastroduodenal ulcer 07/30/2015   . Calcium blood increased 07/30/2015  . Pre-syncope 05/17/2015  . Dyspnea 03/31/2015  . Essential hypertension 03/31/2015  . Bleeding stomach ulcer 10/14/2013  . Anemia, iron deficiency 10/14/2013  . Weakness generalized 10/14/2013  . Ejection fraction   . Pericardial effusion   . PONV (postoperative nausea and vomiting)   . Palpitations   . Low back pain   . Normal nuclear stress test    Alanson Puls, PT, DPT Arelia Sneddon S 03/30/2017, 3:10 PM  [place] MAIN [facility] SERVICES [address] [place], [place], [address] Phone: [phone]   Fax:  [phone]  Name: Nancy Blackburn MRN: [id number] Date of Birth: 11-12-1946

## 2017-04-03 ENCOUNTER — Ambulatory Visit: Payer: Medicare Other | Admitting: Physical Therapy

## 2017-04-03 ENCOUNTER — Encounter: Payer: Self-pay | Admitting: Physical Therapy

## 2017-04-03 DIAGNOSIS — R262 Difficulty in walking, not elsewhere classified: Secondary | ICD-10-CM | POA: Diagnosis not present

## 2017-04-03 DIAGNOSIS — M6281 Muscle weakness (generalized): Secondary | ICD-10-CM

## 2017-04-03 NOTE — Therapy (Signed)
[place] MAIN [facility] SERVICES [address] [place], [place], [address] Phone: [phone]   Fax:  [phone]  Physical Therapy Treatment  Patient Details  Name: Nancy Blackburn MRN: [id number] Date of Birth: 11/09/1946 Referring Provider: Jennings Books K  Encounter Date: 04/03/2017      PT End of Session - 04/03/17 1505    Visit Number 12   Number of Visits 17   Date for PT Re-Evaluation 2017/05/01   Authorization Type g codes Dec 05, 2023   PT Start Time 1500   PT Stop Time 1545   PT Time Calculation (min) 45 min   Equipment Utilized During Treatment Gait belt   Activity Tolerance Patient tolerated treatment well;Patient limited by fatigue   Behavior During Therapy [facility] for tasks assessed/performed      Past Medical History:  Diagnosis Date  . Chronic sinus infection   . Ejection fraction    a. EF 55-65%, echo, mild LVH, June, 2013, small pericardial effusion  . Hyperlipidemia    a. takes Simvastatin  . Hypertension    a. takes Metoprolol, Lisinopril, and amlodipine.  . Low back pain   . Mild dementia   . Nasal polyps   . Normal nuclear stress test    a. 07/2005 Nl nuc stress test.  . Palpitations    a. June 2013:  Event recorder normal sinus rhythm with rare PVCs - ? symptomatic PVC's.  . Parkinson disease ([place])   . Pericardial effusion    a. Small, echo, June, 2013  . PONV (postoperative nausea and vomiting)     Past Surgical History:  Procedure Laterality Date  . BACK SURGERY  08/2010  . BLADDER SUSPENSION  2003  . bone spur  2009   removed from right collar bone area and partial collar bone removed  . THYROIDECTOMY, PARTIAL    . TONSILLECTOMY     as child  . TUBAL LIGATION     70yr ago  . VAGINAL HYSTERECTOMY     89yrago d/t cervical cancer    There were no vitals filed for this visit.      Subjective Assessment - 04/03/17 1500    Subjective Patient has a sore left knee today,    Pertinent History Patient  was Dx 6 months ago and she feels that she is walking slower at home. She is having tremors with right side > left side She continues to be active and has been a little slower the past few days. She had B 12 shots 3 monts ago and now is feeling better.    How long can you sit comfortably? unlimited   How long can you stand comfortably? 30 minutes   How long can you walk comfortably? 30 minutes   Diagnostic tests MRI   Patient Stated Goals to walk better   Currently in Pain? Yes   Pain Score 7    Pain Location Knee   Pain Orientation Left   Pain Descriptors / Indicators Shooting;Sharp;Throbbing   Pain Type Chronic pain   Pain Onset More than a month ago      Treatment Patient seen for LSVT Daily Session Maximal Daily Exercises for facilitation/coordination of movement Sustained movements are designed to rescale the amplitude of movement output for generalization to daily functional activities .Performed as follows for 1 set of 10 repetitions each multidirectional sustained movements  1) Floor to ceiling  2) Side to side multidirectional Repetitive movements performed in standing and are designed to provide retraining  effort needed for sustained muscle activation in tasks  3) Step and reach forward  4) Step and reach backwards  5) Step and reach sideways 6) Rock and reach forward/backward  7) Rock and reach sideways Sit to stand functional component task with supervision 5 reps and simulated activities for: hand coordination tasks such as putting on toothpaste and burshing her teeth,using the toothbrush to brush her teeth with good motor control and coordination, balance tasks for higher level reaching and weight shifting with min assist using blue foam and cones to challenge her dynamic standing balance.  Patient has increased left knee pain that hurts with twisting or walking today.                             PT Education - 04/03/17 1503    Education provided Yes    Education Details LSVT BOG   Person(s) Educated Patient   Methods Explanation   Comprehension Verbalized understanding             PT Long Term Goals - 03/29/17 1519      PT LONG TERM GOAL #1   Title Patient will be independent in home exercise program to improve strength/mobility for better functional independence with ADLs.   Time 4   Period Weeks   Status On-going     PT LONG TERM GOAL #2   Title Patient (> 30 years old) will complete five times sit to stand test in < 15 seconds indicating an increased LE strength and improved balance   Baseline 13.73 sec   Time 4   Period Weeks   Status Partially Met     PT LONG TERM GOAL #3   Title Patient will increase six minute walk test distance to >1000 for progression to community ambulator and improve gait ability   Baseline 1200 feet   Time 4   Period Weeks   Status Partially Met     PT LONG TERM GOAL #4   Title Patient will reduce timed up and go to <11 seconds to reduce fall risk and demonstrate improved transfer/gait ability.   Baseline 7.76 sec   Time 4   Period Weeks   Status Partially Met     PT LONG TERM GOAL #5   Title Patient will ascend/descend 4 stairs without rail assist independently without loss of balance to improve ability to get in/out of home.    Time 4   Period Weeks   Status Partially Met               Plan - 04/03/17 1506    Clinical Impression Statement Min cueing needed to appropriately perform LSVT tasks with leg, hand, and head position. Decreased coordination demonstrated requiring consistent verbal cueing to correct form. Cognitive understanding of task was delayed. Patient continues to demonstrate some in coordination of movement with select exercises such as rock and reach and stepping backwards. Patient responds well to verbal and tactile cues to correct form and technique.  CGA to SBA for safety with activities.  Cues to increase intensity and amplitude of movements throughout  session   Rehab Potential Good   Clinical Impairments Affecting Rehab Potential This patient presents with 2, personal factors/ comorbidities she lives alone and current situtation, and 3  body elements including body structures and functions, activity limitations and or participation restrictions including decreased gait and decreased standing balance, falls risk. Patient's condition is  evolving   PT Frequency  4x / week   PT Duration 4 weeks   PT Treatment/Interventions Gait training;Neuromuscular re-education;Balance training;Therapeutic exercise;Therapeutic activities;Patient/family education   PT Next Visit Plan LSVT BIG   PT Home Exercise Plan LSVT BIG   Consulted and Agree with Plan of Care Patient      Patient will benefit from skilled therapeutic intervention in order to improve the following deficits and impairments:  Abnormal gait, Decreased balance, Decreased endurance, Decreased mobility, Difficulty walking, Decreased activity tolerance, Decreased safety awareness, Decreased strength, Pain, Decreased cognition  Visit Diagnosis: Difficulty in walking, not elsewhere classified  Muscle weakness (generalized)     Problem List Patient Active Problem List   Diagnosis Date Noted  . Family history of Cushing disease 01/12/2017  . B12 deficiency 12/18/2016  . Mild cognitive impairment with memory loss 12/18/2016  . Parkinsonian features 12/18/2016  . Vitamin D deficiency 12/18/2016  . Chalazion, bilateral 06/08/2016  . Absolute anemia 07/30/2015  . Edema extremities 07/30/2015  . Benign essential tremor 07/30/2015  . Anxiety, generalized 07/30/2015  . Gastro-esophageal reflux disease without esophagitis 07/30/2015  . HLD (hyperlipidemia) 07/30/2015  . Cannot sleep 07/30/2015  . Lumbar radiculopathy 07/30/2015  . Depression, major, recurrent, in partial remission ([place]) 07/30/2015  . Arthritis, degenerative 07/30/2015  . Allergic rhinitis 07/30/2015  . Gastroduodenal ulcer  07/30/2015  . Calcium blood increased 07/30/2015  . Pre-syncope 05/17/2015  . Dyspnea 03/31/2015  . Essential hypertension 03/31/2015  . Bleeding stomach ulcer 10/14/2013  . Anemia, iron deficiency 10/14/2013  . Weakness generalized 10/14/2013  . Ejection fraction   . Pericardial effusion   . PONV (postoperative nausea and vomiting)   . Palpitations   . Low back pain   . Normal nuclear stress test    Alanson Puls, PT, DPT Arelia Sneddon S 04/03/2017, 3:07 PM  [place] MAIN [facility] SERVICES [address] [place], [place], [address] Phone: [phone]   Fax:  [phone]  Name: Nancy Blackburn MRN: [id number] Date of Birth: [birth year]

## 2017-04-04 ENCOUNTER — Encounter: Payer: Self-pay | Admitting: Physical Therapy

## 2017-04-04 ENCOUNTER — Ambulatory Visit: Payer: Medicare Other | Admitting: Physical Therapy

## 2017-04-04 DIAGNOSIS — I10 Essential (primary) hypertension: Secondary | ICD-10-CM | POA: Diagnosis not present

## 2017-04-04 DIAGNOSIS — M545 Low back pain: Secondary | ICD-10-CM | POA: Diagnosis not present

## 2017-04-04 DIAGNOSIS — R262 Difficulty in walking, not elsewhere classified: Secondary | ICD-10-CM | POA: Diagnosis not present

## 2017-04-04 DIAGNOSIS — M6281 Muscle weakness (generalized): Secondary | ICD-10-CM | POA: Diagnosis not present

## 2017-04-04 NOTE — Therapy (Signed)
[place] MAIN [facility] SERVICES [address] [place], [place], [address] Phone: [phone]   Fax:  [phone]  Physical Therapy Treatment  Patient Details  Name: DANYEAL AKENS MRN: [id number] Date of Birth: 31-Aug-1946 Referring Provider: Jennings Books K  Encounter Date: 04/04/2017      PT End of Session - 04/04/17 1006    Visit Number 13   Number of Visits 17   Date for PT Re-Evaluation 04-21-17   Authorization Type g codes 13/10   PT Start Time 1000   PT Stop Time 1055   PT Time Calculation (min) 55 min   Equipment Utilized During Treatment Gait belt   Activity Tolerance Patient tolerated treatment well;Patient limited by fatigue   Behavior During Therapy [facility] for tasks assessed/performed      Past Medical History:  Diagnosis Date  . Chronic sinus infection   . Ejection fraction    a. EF 55-65%, echo, mild LVH, June, 2013, small pericardial effusion  . Hyperlipidemia    a. takes Simvastatin  . Hypertension    a. takes Metoprolol, Lisinopril, and amlodipine.  . Low back pain   . Mild dementia   . Nasal polyps   . Normal nuclear stress test    a. 07/2005 Nl nuc stress test.  . Palpitations    a. June 2013:  Event recorder normal sinus rhythm with rare PVCs - ? symptomatic PVC's.  . Parkinson disease ([place])   . Pericardial effusion    a. Small, echo, June, 2013  . PONV (postoperative nausea and vomiting)     Past Surgical History:  Procedure Laterality Date  . BACK SURGERY  08/2010  . BLADDER SUSPENSION  2003  . bone spur  2009   removed from right collar bone area and partial collar bone removed  . THYROIDECTOMY, PARTIAL    . TONSILLECTOMY     as child  . TUBAL LIGATION     58yr ago  . VAGINAL HYSTERECTOMY     89yrago d/t cervical cancer    There were no vitals filed for this visit.      Subjective Assessment - 04/04/17 1005    Subjective Patients left knee is much better today, She is doing her HEP.    Pertinent History Patient was Dx 6 months ago and she feels that she is walking slower at home. She is having tremors with right side > left side She continues to be active and has been a little slower the past few days. She had B 12 shots 3 monts ago and now is feeling better.    How long can you sit comfortably? unlimited   How long can you stand comfortably? 30 minutes   How long can you walk comfortably? 30 minutes   Diagnostic tests MRI   Patient Stated Goals to walk better   Currently in Pain? No/denies   Pain Score 0-No pain   Pain Onset More than a month ago   Multiple Pain Sites No      Patient seen for LSVT Daily Session Maximal Daily Exercises for facilitation/coordination of movement Sustained movements are designed to rescale the amplitude of movement output for generalization to daily functional activities .Performed as follows for 1 set of 10 repetitions each multidirectional sustained movements  1) Floor to ceiling  2) Side to side multidirectional Repetitive movements performed in standing and are designed to provide retraining effort needed for sustained muscle activation in tasks    3) Step and  reach forward  4) Step and reach backwards  5) Step and reach sideways  6) Rock and reach forward/backward  7) Rock and reach sideways Sit to stand functional component task with supervision 5 reps and simulated activities for: hand coordination tasks such as hand coordination tasks such as putting on toothpaste and burshing her teeth,using the toothbrush to brush her teeth with good motor control and coordination, balance tasks for higher level reaching and weight shifting with min assist using blue foam and cones to challenge her dynamic standing balance.  Stationary bike x 15 minutes    Patient continues to demonstrates less incoordination of movement with select exercises such as rock and reach and stepping backwards. Patient responds well to verbal and tactile cues to correct  form and technique. Patient is able to catch mistakes in technique with incorrect positions and is able remember the start and finish positions. Motor control of LE much improved.  Muscle fatigue but no major pain complaints.                         PT Education - 04/04/17 1006    Education provided Yes   Education Details LSVT BIG   Person(s) Educated Patient   Methods Explanation   Comprehension Verbalized understanding             PT Long Term Goals - 03/29/17 1519      PT LONG TERM GOAL #1   Title Patient will be independent in home exercise program to improve strength/mobility for better functional independence with ADLs.   Time 4   Period Weeks   Status On-going     PT LONG TERM GOAL #2   Title Patient (> 45 years old) will complete five times sit to stand test in < 15 seconds indicating an increased LE strength and improved balance   Baseline 13.73 sec   Time 4   Period Weeks   Status Partially Met     PT LONG TERM GOAL #3   Title Patient will increase six minute walk test distance to >1000 for progression to community ambulator and improve gait ability   Baseline 1200 feet   Time 4   Period Weeks   Status Partially Met     PT LONG TERM GOAL #4   Title Patient will reduce timed up and go to <11 seconds to reduce fall risk and demonstrate improved transfer/gait ability.   Baseline 7.76 sec   Time 4   Period Weeks   Status Partially Met     PT LONG TERM GOAL #5   Title Patient will ascend/descend 4 stairs without rail assist independently without loss of balance to improve ability to get in/out of home.    Time 4   Period Weeks   Status Partially Met               Plan - 04/04/17 1007    Clinical Impression Statement Continues to have balance deficits typical with diagnosis. Patient performs intermediate level exercises without pain behaviors and needs verbal cuing for postural alignment and head positioning. Cuing is needed to  stretch the leg out in seated side reaching.Fatigue with sit to stand but demonstrating more control    Rehab Potential Good   Clinical Impairments Affecting Rehab Potential This patient presents with 2, personal factors/ comorbidities she lives alone and current situtation, and 3  body elements including body structures and functions, activity limitations and or participation restrictions including decreased  gait and decreased standing balance, falls risk. Patient's condition is  evolving   PT Frequency 4x / week   PT Duration 4 weeks   PT Treatment/Interventions Gait training;Neuromuscular re-education;Balance training;Therapeutic exercise;Therapeutic activities;Patient/family education   PT Next Visit Plan LSVT BIG   PT Home Exercise Plan LSVT BIG   Consulted and Agree with Plan of Care Patient      Patient will benefit from skilled therapeutic intervention in order to improve the following deficits and impairments:  Abnormal gait, Decreased balance, Decreased endurance, Decreased mobility, Difficulty walking, Decreased activity tolerance, Decreased safety awareness, Decreased strength, Pain, Decreased cognition  Visit Diagnosis: Difficulty in walking, not elsewhere classified  Muscle weakness (generalized)     Problem List Patient Active Problem List   Diagnosis Date Noted  . Family history of Cushing disease 01/12/2017  . B12 deficiency 12/18/2016  . Mild cognitive impairment with memory loss 12/18/2016  . Parkinsonian features 12/18/2016  . Vitamin D deficiency 12/18/2016  . Chalazion, bilateral 06/08/2016  . Absolute anemia 07/30/2015  . Edema extremities 07/30/2015  . Benign essential tremor 07/30/2015  . Anxiety, generalized 07/30/2015  . Gastro-esophageal reflux disease without esophagitis 07/30/2015  . HLD (hyperlipidemia) 07/30/2015  . Cannot sleep 07/30/2015  . Lumbar radiculopathy 07/30/2015  . Depression, major, recurrent, in partial remission ([place]) 07/30/2015   . Arthritis, degenerative 07/30/2015  . Allergic rhinitis 07/30/2015  . Gastroduodenal ulcer 07/30/2015  . Calcium blood increased 07/30/2015  . Pre-syncope 05/17/2015  . Dyspnea 03/31/2015  . Essential hypertension 03/31/2015  . Bleeding stomach ulcer 10/14/2013  . Anemia, iron deficiency 10/14/2013  . Weakness generalized 10/14/2013  . Ejection fraction   . Pericardial effusion   . PONV (postoperative nausea and vomiting)   . Palpitations   . Low back pain   . Normal nuclear stress test    Alanson Puls, PT, DPT Arelia Sneddon S 04/04/2017, 10:09 AM  [place] MAIN [facility] SERVICES [address] [place], [place], [address] Phone: [phone]   Fax:  [phone]  Name: TYIESHA BRACKNEY MRN: [id number] Date of Birth: 1946/05/09

## 2017-04-05 ENCOUNTER — Ambulatory Visit: Payer: Medicare Other | Admitting: Physical Therapy

## 2017-04-05 DIAGNOSIS — R262 Difficulty in walking, not elsewhere classified: Secondary | ICD-10-CM | POA: Diagnosis not present

## 2017-04-05 DIAGNOSIS — M6281 Muscle weakness (generalized): Secondary | ICD-10-CM

## 2017-04-06 ENCOUNTER — Encounter: Payer: Self-pay | Admitting: Physical Therapy

## 2017-04-06 ENCOUNTER — Ambulatory Visit: Payer: Medicare Other | Admitting: Physical Therapy

## 2017-04-06 DIAGNOSIS — M6281 Muscle weakness (generalized): Secondary | ICD-10-CM | POA: Diagnosis not present

## 2017-04-06 DIAGNOSIS — R262 Difficulty in walking, not elsewhere classified: Secondary | ICD-10-CM

## 2017-04-06 NOTE — Therapy (Signed)
[place] MAIN [facility] SERVICES [address] [place], [place], [address] Phone: [phone]   Fax:  [phone]  Physical Therapy Treatment  Patient Details  Name: Nancy Blackburn MRN: [id number] Date of Birth: Jan 09, 1946 Referring Provider: Jennings Books K  Encounter Date: 04/05/2017      PT End of Session - 04/06/17 0947    Visit Number 14   Number of Visits 17   Date for PT Re-Evaluation May 15, 2017   Authorization Type g codes 14/10   PT Start Time 0310   PT Stop Time 0348   PT Time Calculation (min) 38 min   Equipment Utilized During Treatment Gait belt   Activity Tolerance Patient tolerated treatment well;Patient limited by fatigue   Behavior During Therapy [facility] for tasks assessed/performed      Past Medical History:  Diagnosis Date  . Chronic sinus infection   . Ejection fraction    a. EF 55-65%, echo, mild LVH, June, 2013, small pericardial effusion  . Hyperlipidemia    a. takes Simvastatin  . Hypertension    a. takes Metoprolol, Lisinopril, and amlodipine.  . Low back pain   . Mild dementia   . Nasal polyps   . Normal nuclear stress test    a. 07/2005 Nl nuc stress test.  . Palpitations    a. June 2013:  Event recorder normal sinus rhythm with rare PVCs - ? symptomatic PVC's.  . Parkinson disease ([place])   . Pericardial effusion    a. Small, echo, June, 2013  . PONV (postoperative nausea and vomiting)     Past Surgical History:  Procedure Laterality Date  . BACK SURGERY  08/2010  . BLADDER SUSPENSION  19-Feb-2002  . bone spur  02/20/2008   removed from right collar bone area and partial collar bone removed  . THYROIDECTOMY, PARTIAL    . TONSILLECTOMY     as child  . TUBAL LIGATION     57yr ago  . VAGINAL HYSTERECTOMY     89yrago d/t cervical cancer    There were no vitals filed for this visit.      Subjective Assessment - 04/06/17 0946    Subjective Patients left knee is bothering her today, She is doing her HEP.    Pertinent History Patient was Dx 6 months ago and she feels that she is walking slower at home. She is having tremors with right side > left side She continues to be active and has been a little slower the past few days. She had B 12 shots 3 monts ago and now is feeling better.    How long can you sit comfortably? unlimited   How long can you stand comfortably? 30 minutes   How long can you walk comfortably? 30 minutes   Diagnostic tests MRI   Patient Stated Goals to walk better   Currently in Pain? No/denies   Pain Score 0-No pain   Pain Onset More than a month ago       Patient seen for LSVT Daily Session Maximal Daily Exercises for facilitation/coordination of movement Sustained movements are designed to rescale the amplitude of movement output for generalization to daily functional activities .Performed as follows for 1 set of 10 repetitions each multidirectional sustained movements  1) Floor to ceiling  2) Side to side multidirectional Repetitive movements performed in standing and are designed to provide retraining effort needed for sustained muscle activation in tasks  3) Step and reach forward  4) Step and reach  backwards  5) Step and reach sideways 6) Rock and reach forward/backward  7) Rock and reach sideways Sit to stand functional component task with supervision 5 reps and simulated activities for: hand coordination tasks such as hand coordination tasks such as putting on toothpaste and burshing her teeth,using the toothbrush to brush her teeth with good motor control and coordination, balance tasks for higher level reaching and weight shifting with min assist using blue foam and cones to challenge her dynamic standing balance.    Patient continues to demonstrates less incoordination of movement with select exercises such as rock and reach and stepping backwards. Patient responds well to verbal and tactile cues to correct form and technique. Patient is able to catch mistakes in  technique with incorrect positions and is able remember the start and finish positions. Motor control of LE much improved.  Muscle fatigue but no major pain complaints                           PT Education - 04/06/17 0947    Education provided Yes   Education Details LSVT BIG   Person(s) Educated Patient   Methods Explanation   Comprehension Verbalized understanding             PT Long Term Goals - 03/29/17 1519      PT LONG TERM GOAL #1   Title Patient will be independent in home exercise program to improve strength/mobility for better functional independence with ADLs.   Time 4   Period Weeks   Status On-going     PT LONG TERM GOAL #2   Title Patient (> 72 years old) will complete five times sit to stand test in < 15 seconds indicating an increased LE strength and improved balance   Baseline 13.73 sec   Time 4   Period Weeks   Status Partially Met     PT LONG TERM GOAL #3   Title Patient will increase six minute walk test distance to >1000 for progression to community ambulator and improve gait ability   Baseline 1200 feet   Time 4   Period Weeks   Status Partially Met     PT LONG TERM GOAL #4   Title Patient will reduce timed up and go to <11 seconds to reduce fall risk and demonstrate improved transfer/gait ability.   Baseline 7.76 sec   Time 4   Period Weeks   Status Partially Met     PT LONG TERM GOAL #5   Title Patient will ascend/descend 4 stairs without rail assist independently without loss of balance to improve ability to get in/out of home.    Time 4   Period Weeks   Status Partially Met               Plan - 04/06/17 0948    Clinical Impression Statement Patient has fatigue with standing exercises and needs constant VC to have correct posture. Patient has loss of balance and needs UE support intermittently thorough out exercise. Patient has slowness of movement during rotation and beginning movements.   Rehab Potential  Good   Clinical Impairments Affecting Rehab Potential This patient presents with 2, personal factors/ comorbidities she lives alone and current situtation, and 3  body elements including body structures and functions, activity limitations and or participation restrictions including decreased gait and decreased standing balance, falls risk. Patient's condition is  evolving   PT Frequency 4x / week   PT  Duration 4 weeks   PT Treatment/Interventions Gait training;Neuromuscular re-education;Balance training;Therapeutic exercise;Therapeutic activities;Patient/family education   PT Next Visit Plan LSVT BIG   PT Home Exercise Plan LSVT BIG   Consulted and Agree with Plan of Care Patient      Patient will benefit from skilled therapeutic intervention in order to improve the following deficits and impairments:  Abnormal gait, Decreased balance, Decreased endurance, Decreased mobility, Difficulty walking, Decreased activity tolerance, Decreased safety awareness, Decreased strength, Pain, Decreased cognition  Visit Diagnosis: Difficulty in walking, not elsewhere classified  Muscle weakness (generalized)     Problem List Patient Active Problem List   Diagnosis Date Noted  . Family history of Cushing disease 01/12/2017  . B12 deficiency 12/18/2016  . Mild cognitive impairment with memory loss 12/18/2016  . Parkinsonian features 12/18/2016  . Vitamin D deficiency 12/18/2016  . Chalazion, bilateral 06/08/2016  . Absolute anemia 07/30/2015  . Edema extremities 07/30/2015  . Benign essential tremor 07/30/2015  . Anxiety, generalized 07/30/2015  . Gastro-esophageal reflux disease without esophagitis 07/30/2015  . HLD (hyperlipidemia) 07/30/2015  . Cannot sleep 07/30/2015  . Lumbar radiculopathy 07/30/2015  . Depression, major, recurrent, in partial remission ([place]) 07/30/2015  . Arthritis, degenerative 07/30/2015  . Allergic rhinitis 07/30/2015  . Gastroduodenal ulcer 07/30/2015  . Calcium  blood increased 07/30/2015  . Pre-syncope 05/17/2015  . Dyspnea 03/31/2015  . Essential hypertension 03/31/2015  . Bleeding stomach ulcer 10/14/2013  . Anemia, iron deficiency 10/14/2013  . Weakness generalized 10/14/2013  . Ejection fraction   . Pericardial effusion   . PONV (postoperative nausea and vomiting)   . Palpitations   . Low back pain   . Normal nuclear stress test    Alanson Puls, PT, DPT Arelia Sneddon S 04/06/2017, 9:52 AM  [place] MAIN [facility] SERVICES [address] [place], [place], [address] Phone: [phone]   Fax:  [phone]  Name: Nancy Blackburn MRN: [id number] Date of Birth: [birth year]

## 2017-04-06 NOTE — Therapy (Addendum)
[place] MAIN [facility] SERVICES [address] [place], [place], [address] Phone: [phone]   Fax:  [phone]  Physical Therapy Treatment  Patient Details  Name: Nancy Blackburn MRN: [id number] Date of Birth: 1946/06/05 Referring Provider: Jennings Books K  Encounter Date: 04/06/2017      PT End of Session - 04/06/17 1512    Visit Number 15   Number of Visits 17   Date for PT Re-Evaluation 2017/05/07   Authorization Type g codes 15/10   PT Start Time 0300   Equipment Utilized During Treatment Gait belt   Activity Tolerance Patient tolerated treatment well;Patient limited by fatigue   Behavior During Therapy [facility] for tasks assessed/performed      Past Medical History:  Diagnosis Date  . Chronic sinus infection   . Ejection fraction    a. EF 55-65%, echo, mild LVH, June, 2013, small pericardial effusion  . Hyperlipidemia    a. takes Simvastatin  . Hypertension    a. takes Metoprolol, Lisinopril, and amlodipine.  . Low back pain   . Mild dementia   . Nasal polyps   . Normal nuclear stress test    a. 07/2005 Nl nuc stress test.  . Palpitations    a. June 2013:  Event recorder normal sinus rhythm with rare PVCs - ? symptomatic PVC's.  . Parkinson disease ([place])   . Pericardial effusion    a. Small, echo, June, 2013  . PONV (postoperative nausea and vomiting)     Past Surgical History:  Procedure Laterality Date  . BACK SURGERY  08/2010  . BLADDER SUSPENSION  2003  . bone spur  2009   removed from right collar bone area and partial collar bone removed  . THYROIDECTOMY, PARTIAL    . TONSILLECTOMY     as child  . TUBAL LIGATION     72yr ago  . VAGINAL HYSTERECTOMY     89yrago d/t cervical cancer    There were no vitals filed for this visit.      Subjective Assessment - 04/06/17 1510    Subjective Patients left knee is better today, She is doing her HEP.   Pertinent History Patient was Dx 6 months ago and she feels  that she is walking slower at home. She is having tremors with right side > left side She continues to be active and has been a little slower the past few days. She had B 12 shots 3 monts ago and now is feeling better.    How long can you sit comfortably? unlimited   How long can you stand comfortably? 30 minutes   How long can you walk comfortably? 30 minutes   Diagnostic tests MRI   Patient Stated Goals to walk better   Currently in Pain? Yes   Pain Score 2    Pain Location Knee   Pain Orientation Left   Pain Descriptors / Indicators Aching   Pain Type Chronic pain   Pain Onset More than a month ago   Pain Frequency Intermittent   Aggravating Factors  walking   Pain Relieving Factors shots   Effect of Pain on Daily Activities unable to walk or do activiites   Multiple Pain Sites --  back 2/10      Patient seen for LSVT Daily Session Maximal Daily Exercises for facilitation/coordination of movement Sustained movements are designed to rescale the amplitude of movement output for generalization to daily functional activities .Performed as follows for 1 set of  10 repetitions each multidirectional sustained movements  1) Floor to ceiling  2) Side to side multidirectional Repetitive movements performed in standing and are designed to provide retraining effort needed for sustained muscle activation in tasks    3) Step and reach forward  4) Step and reach backwards  5) Step and reach sideways  6) Rock and reach forward/backward  7) Rock and reach sideways Sit to stand functional component task with supervision 5 reps and simulated activities for: putting on toothpaste and burshing her teeth,using the toothbrush to brush her teeth with good motor control and coordination, balance tasks for higher level reaching and weight shifting with min assist using blue foam and cones to challenge her dynamic standing balance.  Stationary bike x 15 minutes  Patient has better stepping pattern with less  stopping and better posture, Patient has difficulty with finishing BIG and needs extra cuing to perform exercises with correct amplitude and speed. Patient has difficulty with turning  head and rotating  trunk with weight shifting exercises                            PT Education - 04/06/17 1512    Education provided Yes   Education Details LSVT BIG   Person(s) Educated Patient   Methods Explanation   Comprehension Verbalized understanding             PT Long Term Goals - 03/29/17 1519      PT LONG TERM GOAL #1   Title Patient will be independent in home exercise program to improve strength/mobility for better functional independence with ADLs.   Time 4   Period Weeks   Status On-going     PT LONG TERM GOAL #2   Title Patient (> 63 years old) will complete five times sit to stand test in < 15 seconds indicating an increased LE strength and improved balance   Baseline 13.73 sec   Time 4   Period Weeks   Status Partially Met     PT LONG TERM GOAL #3   Title Patient will increase six minute walk test distance to >1000 for progression to community ambulator and improve gait ability   Baseline 1200 feet   Time 4   Period Weeks   Status Partially Met     PT LONG TERM GOAL #4   Title Patient will reduce timed up and go to <11 seconds to reduce fall risk and demonstrate improved transfer/gait ability.   Baseline 7.76 sec   Time 4   Period Weeks   Status Partially Met     PT LONG TERM GOAL #5   Title Patient will ascend/descend 4 stairs without rail assist independently without loss of balance to improve ability to get in/out of home.    Time 4   Period Weeks   Status Partially Met               Plan - 04/06/17 1513    Clinical Impression Statement Patient is able to perform  exercises with modeling and verbal correction and cueing to step in the correct position. Patient need cueing for BIG arm swing, Patient needs cueing for BIG swing and  adding dual cognitive loads and distracters.Patient will continue to benefit from skilled therapy in order to improve dynamic standing balance and increase endurance   Rehab Potential Good   Clinical Impairments Affecting Rehab Potential This patient presents with 2, personal factors/ comorbidities she lives alone  and current situtation, and 3  body elements including body structures and functions, activity limitations and or participation restrictions including decreased gait and decreased standing balance, falls risk. Patient's condition is  evolving   PT Frequency 4x / week   PT Duration 4 weeks   PT Treatment/Interventions Gait training;Neuromuscular re-education;Balance training;Therapeutic exercise;Therapeutic activities;Patient/family education   PT Next Visit Plan LSVT BIG   PT Home Exercise Plan LSVT BIG   Consulted and Agree with Plan of Care Patient      Patient will benefit from skilled therapeutic intervention in order to improve the following deficits and impairments:  Abnormal gait, Decreased balance, Decreased endurance, Decreased mobility, Difficulty walking, Decreased activity tolerance, Decreased safety awareness, Decreased strength, Pain, Decreased cognition  Visit Diagnosis: Difficulty in walking, not elsewhere classified  Muscle weakness (generalized)     Problem List Patient Active Problem List   Diagnosis Date Noted  . Family history of Cushing disease 01/12/2017  . B12 deficiency 12/18/2016  . Mild cognitive impairment with memory loss 12/18/2016  . Parkinsonian features 12/18/2016  . Vitamin D deficiency 12/18/2016  . Chalazion, bilateral 06/08/2016  . Absolute anemia 07/30/2015  . Edema extremities 07/30/2015  . Benign essential tremor 07/30/2015  . Anxiety, generalized 07/30/2015  . Gastro-esophageal reflux disease without esophagitis 07/30/2015  . HLD (hyperlipidemia) 07/30/2015  . Cannot sleep 07/30/2015  . Lumbar radiculopathy 07/30/2015  .  Depression, major, recurrent, in partial remission ([place]) 07/30/2015  . Arthritis, degenerative 07/30/2015  . Allergic rhinitis 07/30/2015  . Gastroduodenal ulcer 07/30/2015  . Calcium blood increased 07/30/2015  . Pre-syncope 05/17/2015  . Dyspnea 03/31/2015  . Essential hypertension 03/31/2015  . Bleeding stomach ulcer 10/14/2013  . Anemia, iron deficiency 10/14/2013  . Weakness generalized 10/14/2013  . Ejection fraction   . Pericardial effusion   . PONV (postoperative nausea and vomiting)   . Palpitations   . Low back pain   . Normal nuclear stress test    Alanson Puls, PT, DPT Arelia Sneddon S 04/06/2017, 3:55 PM  [place] MAIN [facility] SERVICES [address] [place], [place], [address] Phone: [phone]   Fax:  [phone]  Name: Nancy Blackburn MRN: [id number] Date of Birth: 09/14/1946

## 2017-04-10 ENCOUNTER — Ambulatory Visit: Payer: Medicare Other | Admitting: Physical Therapy

## 2017-04-12 ENCOUNTER — Ambulatory Visit
Admission: RE | Admit: 2017-04-12 | Discharge: 2017-04-12 | Disposition: A | Discharge status: Home / Self Care | Payer: Medicare Other | Source: Ambulatory Visit | Attending: Family Medicine | Admitting: Family Medicine

## 2017-04-12 ENCOUNTER — Ambulatory Visit: Payer: Medicare Other | Attending: Neurology | Admitting: Physical Therapy

## 2017-04-12 ENCOUNTER — Encounter: Payer: Self-pay | Admitting: Physical Therapy

## 2017-04-12 DIAGNOSIS — Z1231 Encounter for screening mammogram for malignant neoplasm of breast: Secondary | ICD-10-CM | POA: Diagnosis not present

## 2017-04-12 DIAGNOSIS — R262 Difficulty in walking, not elsewhere classified: Secondary | ICD-10-CM | POA: Insufficient documentation

## 2017-04-12 DIAGNOSIS — M6281 Muscle weakness (generalized): Secondary | ICD-10-CM | POA: Insufficient documentation

## 2017-04-12 NOTE — Therapy (Addendum)
[place] MAIN [facility] SERVICES [address] [place], [place], [address] Phone: [phone]   Fax:  [phone]  Physical Therapy Treatment/ Discharge Summary  Patient Details  Name: Nancy Blackburn MRN: [id number] Date of Birth: 1946/07/16 Referring Provider: Jennings Books K  Encounter Date: 04/12/2017      PT End of Session - 04/12/17 1020    Visit Number 16   Number of Visits 17   Date for PT Re-Evaluation 04-21-2017   Authorization Type g codes 16/10   PT Start Time 1000   PT Stop Time 1100   PT Time Calculation (min) 60 min   Equipment Utilized During Treatment Gait belt   Activity Tolerance Patient tolerated treatment well;Patient limited by fatigue   Behavior During Therapy [facility] for tasks assessed/performed      Past Medical History:  Diagnosis Date  . Chronic sinus infection   . Ejection fraction    a. EF 55-65%, echo, mild LVH, June, 2013, small pericardial effusion  . Hyperlipidemia    a. takes Simvastatin  . Hypertension    a. takes Metoprolol, Lisinopril, and amlodipine.  . Low back pain   . Mild dementia   . Nasal polyps   . Normal nuclear stress test    a. 07/2005 Nl nuc stress test.  . Palpitations    a. June 2013:  Event recorder normal sinus rhythm with rare PVCs - ? symptomatic PVC's.  . Parkinson disease ([place])   . Pericardial effusion    a. Small, echo, June, 2013  . PONV (postoperative nausea and vomiting)     Past Surgical History:  Procedure Laterality Date  . BACK SURGERY  08/2010  . BLADDER SUSPENSION  2003  . bone spur  2009   removed from right collar bone area and partial collar bone removed  . THYROIDECTOMY, PARTIAL    . TONSILLECTOMY     as child  . TUBAL LIGATION     27yr ago  . VAGINAL HYSTERECTOMY     89yrago d/t cervical cancer    There were no vitals filed for this visit.      Subjective Assessment - 04/12/17 1018    Subjective Patients left knee is better today, Now her  right knee is hurting . She is doing her HEP.   Pertinent History Patient was Dx 6 months ago and she feels that she is walking slower at home. She is having tremors with right side > left side She continues to be active and has been a little slower the past few days. She had B 12 shots 3 monts ago and now is feeling better.    How long can you sit comfortably? unlimited   How long can you stand comfortably? 30 minutes   How long can you walk comfortably? 30 minutes   Diagnostic tests MRI   Patient Stated Goals to walk better   Currently in Pain? Yes   Pain Score 5    Pain Location Knee   Pain Orientation Right   Pain Onset More than a month ago     Treatment:    OUTCOME MEASURES: TEST Outcome Interpretation  5 times sit<>stand 12.56sec >6>66o, >15 sec indicates increased risk for falls  10 meter walk test 1.5054m<1.0 m/s indicates increased risk for falls; limited community ambulator  Timed up and Go 6.69sec <14 sec indicates increased risk for falls  6 minute walk test 1200Feet 1000 feet is community ambulator  BERG [id number] is  normal         Treatment Patient seen for LSVT Daily Session Maximal Daily Exercises for facilitation/coordination of movement Sustained movements are designed to rescale the amplitude of movement output for generalization to daily functional activities .Performed as follows for 1 set of 10 repetitions each multidirectional sustained movements  1) Floor to ceiling  2) Side to side multidirectional Repetitive movements performed in standing and are designed to provide retraining effort needed for sustained muscle activation in tasks  3) Step and reach forward  4) Step and reach backwards  5) Step and reach sideways 6) Rock and reach forward/backward  7) Rock and reach sideways Sit to stand functional component task with supervision 5 reps and simulated activities for: hand coordination tasks such as putting on  toothpaste and burshing her teeth,using the toothbrush to brush her teeth with good motor control and coordination, balance tasks for higher level reaching and weight shifting with min assist using blue foam and cones to challenge her dynamic standing balance.  Patient has increased right knee pain that hurts with twisting or walking today. Her outcome measures improved with TUG, 10 MW, and 5 x sit to stand, Merrilee Jansky, and 6 MW and her goals have all been met except her ascending and descending steps without a rail due to right and left knee pain,.                          PT Education - 04/12/17 1019    Education provided Yes   Education Details LSVT BIG   Person(s) Educated Patient   Methods Explanation   Comprehension Verbalized understanding             PT Long Term Goals - 04/12/17 1037      PT LONG TERM GOAL #1   Title Patient will be independent in home exercise program to improve strength/mobility for better functional independence with ADLs.   Time 4   Period Weeks   Status Achieved     PT LONG TERM GOAL #2   Title Patient (> 72 years old) will complete five times sit to stand test in < 15 seconds indicating an increased LE strength and improved balance   Baseline 12.56 sec   Time 4   Period Weeks   Status Achieved     PT LONG TERM GOAL #3   Title Patient will increase six minute walk test distance to >1000 for progression to community ambulator and improve gait ability   Baseline 1200 ft   Time 4   Period Weeks   Status Achieved     PT LONG TERM GOAL #4   Title Patient will reduce timed up and go to <11 seconds to reduce fall risk and demonstrate improved transfer/gait ability.   Baseline 6.69 sec   Time 4   Period Weeks   Status Achieved     PT LONG TERM GOAL #5   Title Patient will ascend/descend 4 stairs without rail assist independently without loss of balance to improve ability to get in/out of home.    Baseline indpendent with 1 rail    Time 4   Period Weeks   Status Partially Met               Plan - 04/12/17 1520    Clinical Impression Statement Patint needs min cueing needed to appropriately perform LSVT tasks with leg, hand, and head position. Decreased coordination demonstrated requiring less verbal cueing to correct  form. Pt tolerated well and will be DC today to HEP . She completed her goals and reached all goals. She is ready to continue her HEP.    Rehab Potential Good   Clinical Impairments Affecting Rehab Potential This patient presents with 2, personal factors/ comorbidities she lives alone and current situtation, and 3  body elements including body structures and functions, activity limitations and or participation restrictions including decreased gait and decreased standing balance, falls risk. Patient's condition is  evolving   PT Frequency 4x / week   PT Duration 4 weeks   PT Treatment/Interventions Gait training;Neuromuscular re-education;Balance training;Therapeutic exercise;Therapeutic activities;Patient/family education   PT Next Visit Plan LSVT BIG   PT Home Exercise Plan LSVT BIG   Consulted and Agree with Plan of Care Patient      Patient will benefit from skilled therapeutic intervention in order to improve the following deficits and impairments:  Abnormal gait, Decreased balance, Decreased endurance, Decreased mobility, Difficulty walking, Decreased activity tolerance, Decreased safety awareness, Decreased strength, Pain, Decreased cognition  Visit Diagnosis: Difficulty in walking, not elsewhere classified  Muscle weakness (generalized)     Problem List Patient Active Problem List   Diagnosis Date Noted  . Family history of Cushing disease 01/12/2017  . B12 deficiency 12/18/2016  . Mild cognitive impairment with memory loss 12/18/2016  . Parkinsonian features 12/18/2016  . Vitamin D deficiency 12/18/2016  . Chalazion, bilateral 06/08/2016  . Absolute anemia 07/30/2015  .  Edema extremities 07/30/2015  . Benign essential tremor 07/30/2015  . Anxiety, generalized 07/30/2015  . Gastro-esophageal reflux disease without esophagitis 07/30/2015  . HLD (hyperlipidemia) 07/30/2015  . Cannot sleep 07/30/2015  . Lumbar radiculopathy 07/30/2015  . Depression, major, recurrent, in partial remission ([place]) 07/30/2015  . Arthritis, degenerative 07/30/2015  . Allergic rhinitis 07/30/2015  . Gastroduodenal ulcer 07/30/2015  . Calcium blood increased 07/30/2015  . Pre-syncope 05/17/2015  . Dyspnea 03/31/2015  . Essential hypertension 03/31/2015  . Bleeding stomach ulcer 10/14/2013  . Anemia, iron deficiency 10/14/2013  . Weakness generalized 10/14/2013  . Ejection fraction   . Pericardial effusion   . PONV (postoperative nausea and vomiting)   . Palpitations   . Low back pain   . Normal nuclear stress test    Alanson Puls, PT, DPT Arelia Sneddon S 04/12/2017, 3:21 PM  [place] MAIN [facility] SERVICES [address] [place], [place], [address] Phone: [phone]   Fax:  [phone]  Name: INETHA MARET MRN: [id number] Date of Birth: May 12, 1946

## 2017-04-13 DIAGNOSIS — E538 Deficiency of other specified B group vitamins: Secondary | ICD-10-CM | POA: Diagnosis not present

## 2017-04-19 DIAGNOSIS — M170 Bilateral primary osteoarthritis of knee: Secondary | ICD-10-CM | POA: Diagnosis not present

## 2017-05-04 ENCOUNTER — Ambulatory Visit (INDEPENDENT_AMBULATORY_CARE_PROVIDER_SITE_OTHER): Payer: Medicare Other | Admitting: Family Medicine

## 2017-05-04 ENCOUNTER — Encounter: Payer: Self-pay | Admitting: Family Medicine

## 2017-05-04 VITALS — BP 104/72 | HR 58 | Temp 98.6°F | Resp 16 | Wt 178.8 lb

## 2017-05-04 DIAGNOSIS — L710 Perioral dermatitis: Secondary | ICD-10-CM

## 2017-05-04 DIAGNOSIS — M5412 Radiculopathy, cervical region: Secondary | ICD-10-CM | POA: Insufficient documentation

## 2017-05-04 DIAGNOSIS — M47812 Spondylosis without myelopathy or radiculopathy, cervical region: Secondary | ICD-10-CM | POA: Insufficient documentation

## 2017-05-04 MED ORDER — DOXYCYCLINE MONOHYDRATE 100 MG PO CAPS: 100.0000 mg | ORAL_CAPSULE | Freq: Two times a day (BID) | ORAL | 1 refills | Status: DC

## 2017-05-04 NOTE — Progress Notes (Signed)
Subjective:     Patient ID: Nancy Blackburn, female   DOB: 1946/08/15, 71 y.o.   MRN: [id number]  HPI  Chief Complaint  Patient presents with  . Skin Problem    Patient comes in office today with concerns of possible fever blisters that have appeared around her mouth for the past 3-4 weeks. Pateint states that skin initially had small bumps pop up around mouth and patient had tried using Abreva with no success.   Denies use of topical steroids or hx of acne.   Review of Systems     Objective:   Physical Exam  Constitutional: She appears well-developed and well-nourished. No distress.  Skin:  1-2 mm.papules from right corner of her mouth to mid chin (below vermilion border)       Assessment:    1. Perioral dermatitis - doxycycline (MONODOX) 100 MG capsule; Take 1 capsule (100 mg total) by mouth 2 (two) times daily.  Dispense: 14 capsule; Refill: 1    Plan:    If not improving call for dermatology referral. Avoid any skin irritants.

## 2017-05-04 NOTE — Patient Instructions (Signed)
Let us know if the rash is not improving.

## 2017-05-09 DIAGNOSIS — F5105 Insomnia due to other mental disorder: Secondary | ICD-10-CM | POA: Diagnosis not present

## 2017-05-09 DIAGNOSIS — F331 Major depressive disorder, recurrent, moderate: Secondary | ICD-10-CM | POA: Diagnosis not present

## 2017-05-15 DIAGNOSIS — E538 Deficiency of other specified B group vitamins: Secondary | ICD-10-CM | POA: Diagnosis not present

## 2017-05-17 ENCOUNTER — Other Ambulatory Visit: Payer: Self-pay | Admitting: Family Medicine

## 2017-05-17 ENCOUNTER — Telehealth: Payer: Self-pay | Admitting: Family Medicine

## 2017-05-17 DIAGNOSIS — R21 Rash and other nonspecific skin eruption: Secondary | ICD-10-CM

## 2017-05-17 NOTE — Telephone Encounter (Signed)
Referral in progress. 

## 2017-05-17 NOTE — Telephone Encounter (Signed)
Pt stated when she was here for her OV on 05/04/17 she was advised if her rash wasn't better in a week to call back and Mikki Santee would refer pt to dermatology. Pt is requesting to move forward with the referral. Pt stated that she would like to see someone in the [place] area and has heard good things about Dr. Nehemiah Massed but is willing to see whoever Mikki Santee suggest in the [place] area. Please advise. Thanks TNP

## 2017-05-23 DIAGNOSIS — D692 Other nonthrombocytopenic purpura: Secondary | ICD-10-CM | POA: Diagnosis not present

## 2017-05-23 DIAGNOSIS — K130 Diseases of lips: Secondary | ICD-10-CM | POA: Diagnosis not present

## 2017-05-23 DIAGNOSIS — L578 Other skin changes due to chronic exposure to nonionizing radiation: Secondary | ICD-10-CM | POA: Diagnosis not present

## 2017-06-21 DIAGNOSIS — L304 Erythema intertrigo: Secondary | ICD-10-CM | POA: Diagnosis not present

## 2017-06-21 DIAGNOSIS — B372 Candidiasis of skin and nail: Secondary | ICD-10-CM | POA: Diagnosis not present

## 2017-06-21 DIAGNOSIS — K130 Diseases of lips: Secondary | ICD-10-CM | POA: Diagnosis not present

## 2017-06-22 DIAGNOSIS — F331 Major depressive disorder, recurrent, moderate: Secondary | ICD-10-CM | POA: Diagnosis not present

## 2017-06-22 DIAGNOSIS — F5105 Insomnia due to other mental disorder: Secondary | ICD-10-CM | POA: Diagnosis not present

## 2017-06-26 ENCOUNTER — Encounter: Payer: Self-pay | Admitting: Family Medicine

## 2017-06-26 ENCOUNTER — Ambulatory Visit (INDEPENDENT_AMBULATORY_CARE_PROVIDER_SITE_OTHER): Payer: Medicare Other | Admitting: Family Medicine

## 2017-06-26 VITALS — BP 128/82 | HR 65 | Temp 97.5°F | Wt 182.2 lb

## 2017-06-26 DIAGNOSIS — R350 Frequency of micturition: Secondary | ICD-10-CM | POA: Diagnosis not present

## 2017-06-26 LAB — POCT URINALYSIS DIPSTICK
Bilirubin, UA: NEGATIVE
Blood, UA: NEGATIVE
Glucose, UA: NEGATIVE
Ketones, UA: NEGATIVE
Nitrite, UA: NEGATIVE
Protein, UA: NEGATIVE
Spec Grav, UA: 1.02
Urobilinogen, UA: 0.2 U/dL
pH, UA: 6.5

## 2017-06-26 NOTE — Patient Instructions (Signed)

## 2017-06-26 NOTE — Progress Notes (Signed)
[name] Female    DOB: 1946/02/01   71 y.o.   MRN: [id number] Visit Date: 06/26/2017  Today's Provider: Vernie Murders, PA   Chief Complaint  Patient presents with  . Urinary Tract Infection   Subjective:    Urinary Tract Infection   This is a new problem. Episode onset: last week. The problem has been gradually worsening. The quality of the pain is described as aching. There has been no fever. Associated symptoms include frequency and urgency. Associated symptoms comments: Abdominal pain . She has tried nothing for the symptoms. Her past medical history is significant for kidney stones and recurrent UTIs.   Patient Active Problem List   Diagnosis Date Noted  . Cervical spondylosis 05/04/2017  . Cervical radiculopathy 05/04/2017  . Family history of Cushing disease 01/12/2017  . B12 deficiency 12/18/2016  . Mild cognitive impairment with memory loss 12/18/2016  . Parkinsonian features 12/18/2016  . Vitamin D deficiency 12/18/2016  . Chalazion, bilateral 06/08/2016  . Absolute anemia 07/30/2015  . Edema extremities 07/30/2015  . Benign essential tremor 07/30/2015  . Anxiety, generalized 07/30/2015  . Gastro-esophageal reflux disease without esophagitis 07/30/2015  . HLD (hyperlipidemia) 07/30/2015  . Cannot sleep 07/30/2015  . Lumbar radiculopathy 07/30/2015  . Depression, major, recurrent, in partial remission ([place]) 07/30/2015  . Arthritis, degenerative 07/30/2015  . Allergic rhinitis 07/30/2015  . Gastroduodenal ulcer 07/30/2015  . Calcium blood increased 07/30/2015  . Pre-syncope 05/17/2015  . Dyspnea 03/31/2015  . Essential hypertension 03/31/2015  . Bleeding stomach ulcer 10/14/2013  . Anemia, iron deficiency 10/14/2013  . Weakness generalized 10/14/2013  . Ejection fraction   . Pericardial effusion   . PONV (postoperative nausea and vomiting)   . Palpitations   . Low back pain   . Normal nuclear stress test    Past Surgical History:    Procedure Laterality Date  . BACK SURGERY  08/2010  . BLADDER SUSPENSION  2003  . bone spur  2009   removed from right collar bone area and partial collar bone removed  . THYROIDECTOMY, PARTIAL    . TONSILLECTOMY     as child  . TUBAL LIGATION     83yrs ago  . VAGINAL HYSTERECTOMY     29yrs ago d/t cervical cancer   Family History  Problem Relation Age of Onset  . Colon cancer Mother   . Heart murmur Mother   . Heart disease Mother   . Angina Mother   . Heart disease Father   . COPD Father   . Atrial fibrillation Brother   . Cushing syndrome Daughter   . Cancer Daughter        thyroid  . Breast cancer Cousin   . Hypotension Neg Hx   . Malignant hyperthermia Neg Hx   . Pseudochol deficiency Neg Hx    Allergies  Allergen Reactions  . Anti-Inflammatory Enzyme [Nutritional Supplements] Other (See Comments)    REACTION: Due to bleeding ulcer  . Cymbalta [Duloxetine Hcl] Other (See Comments)    REACTION: Urinary retention  . Clarithromycin Other (See Comments)    Upsets stomach  . Nsaids     GI BLEED  . Ranitidine     severe diarrhea  . Venlafaxine     severe depression     Previous Medications   BIOTIN PO    Take 5,000 mcg by mouth daily.    BUSPIRONE (BUSPAR) 10 MG TABLET       CALCIUM CARBONATE-VITAMIN D (CALCIUM 600+D) 600-200 MG-UNIT  TABS    Take 1 tablet by mouth daily.   CITALOPRAM (CELEXA) 40 MG TABLET    40 mg daily.    CYANOCOBALAMIN (VITAMIN B-12 IJ)    Inject as directed.   DIAZEPAM (VALIUM) 5 MG TABLET    Take 0.5-1 tablets (2.5-5 mg total) by mouth every 12 (twelve) hours as needed for anxiety.   DONEPEZIL (ARICEPT) 5 MG TABLET    Take 5 mg by mouth at bedtime.   FISH OIL-OMEGA-3 FATTY ACIDS 1000 MG CAPSULE    Take 1 g by mouth daily.     FLUTICASONE FUROATE-VILANTEROL (BREO ELLIPTA) 200-25 MCG/INH AEPB    Inhale 1 puff into the lungs daily.   METOPROLOL TARTRATE (LOPRESSOR) 25 MG TABLET    Take 0.5 tablets (12.5 mg total) by mouth 2 (two) times daily.    MONTELUKAST (SINGULAIR) 10 MG TABLET    Take 1 tablet (10 mg total) by mouth at bedtime.   PRIMIDONE (MYSOLINE) 50 MG TABLET    Take 50 mg by mouth at bedtime.   ROSUVASTATIN (CRESTOR) 10 MG TABLET    Take 1 tablet (10 mg total) by mouth daily.   TOLTERODINE (DETROL LA) 4 MG 24 HR CAPSULE    Take 4 mg by mouth daily.   TRAZODONE HCL PO    Take 250 mg by mouth at bedtime.   VITAMIN B-12 (CYANOCOBALAMIN) 1000 MCG TABLET    Take 1,000 mcg by mouth daily.    Review of Systems  Constitutional: Negative.   Respiratory: Negative.   Cardiovascular: Negative.   Genitourinary: Positive for frequency and urgency.    Social History  Substance Use Topics  . Smoking status: Never Smoker  . Smokeless tobacco: Never Used  . Alcohol use 0.0 oz/week     Comment: drinks 3-4 times a year   Objective:   BP 128/82 (BP Location: Right Arm, Patient Position: Sitting, Cuff Size: Normal)   Pulse 65   Temp (!) 97.5 F (36.4 C) (Oral)   Wt 182 lb 3.2 oz (82.6 kg)   SpO2 98%   BMI 28.75 kg/m   Physical Exam  Constitutional: She is oriented to person, place, and time. She appears well-developed and well-nourished. No distress.  HENT:  Head: Normocephalic and atraumatic.  Right Ear: Hearing normal.  Left Ear: Hearing normal.  Nose: Nose normal.  Eyes: Conjunctivae and lids are normal. Right eye exhibits no discharge. Left eye exhibits no discharge. No scleral icterus.  Neck: Neck supple.  Cardiovascular: Normal rate and regular rhythm.   Pulmonary/Chest: Effort normal and breath sounds normal. No respiratory distress.  Abdominal: Soft. Bowel sounds are normal. There is tenderness.  Suprapubic tenderness to palpate. No CVA tenderness posteriorly to percussion.  Musculoskeletal: Normal range of motion.  Neurological: She is alert and oriented to person, place, and time.  Skin: Skin is intact. No lesion and no rash noted.  Psychiatric: She has a normal mood and affect. Her speech is normal and  behavior is normal. Thought content normal.    Assessment & Plan:     1. Frequent urination Onset over the past week. No fever or hematuria. Urinalysis showed some pyuria and having nocturia 5 times a night with daytime frequency, also. Recommend increasing water intake and use AZO-Standard for discomfort. Wants to wait on initiating antibiotic until culture report is back. Recheck prn. - POCT Urinalysis Dipstick - Urine Culture

## 2017-06-27 DIAGNOSIS — F5105 Insomnia due to other mental disorder: Secondary | ICD-10-CM | POA: Diagnosis not present

## 2017-06-27 DIAGNOSIS — F331 Major depressive disorder, recurrent, moderate: Secondary | ICD-10-CM | POA: Diagnosis not present

## 2017-06-28 LAB — URINE CULTURE

## 2017-06-29 ENCOUNTER — Telehealth: Payer: Self-pay

## 2017-06-29 NOTE — Telephone Encounter (Signed)
Patient advised. She states she is still having mild frequency and discomfort, but she is leaving for vacation and unable to get a RX. She states she will call back if needed after she gets back from vacation.

## 2017-06-29 NOTE — Telephone Encounter (Signed)
-----   Message from Margo Common, [place] sent at 06/29/2017  8:07 AM EDT ----- Mixed bacterial growth on culture is mild. If still having urinary frequency or discomfort, recommend Cipro 500 mg BID #14. Recheck prn.

## 2017-07-03 ENCOUNTER — Telehealth: Payer: Self-pay | Admitting: Family Medicine

## 2017-07-03 DIAGNOSIS — F5105 Insomnia due to other mental disorder: Secondary | ICD-10-CM | POA: Diagnosis not present

## 2017-07-03 DIAGNOSIS — F331 Major depressive disorder, recurrent, moderate: Secondary | ICD-10-CM | POA: Diagnosis not present

## 2017-07-03 DIAGNOSIS — K130 Diseases of lips: Secondary | ICD-10-CM | POA: Diagnosis not present

## 2017-07-03 DIAGNOSIS — L304 Erythema intertrigo: Secondary | ICD-10-CM | POA: Diagnosis not present

## 2017-07-03 DIAGNOSIS — B3783 Candidal cheilitis: Secondary | ICD-10-CM | POA: Diagnosis not present

## 2017-07-03 MED ORDER — CIPROFLOXACIN HCL 500 MG PO TABS: 500.0000 mg | ORAL_TABLET | Freq: Two times a day (BID) | ORAL | 0 refills | Status: DC

## 2017-07-03 NOTE — Telephone Encounter (Signed)
RX for Cipro 500 mg sent to [facility]. See patient message from 06/29/2017. Patient advised.

## 2017-07-03 NOTE — Telephone Encounter (Signed)
Pt called saying she does need the mediation for UTI.  She uses [facility]  Her call back is [phone]  Thanks [employer]

## 2017-07-10 ENCOUNTER — Encounter: Payer: Self-pay | Admitting: Family Medicine

## 2017-07-10 ENCOUNTER — Ambulatory Visit: Payer: Medicare Other | Admitting: Family Medicine

## 2017-07-10 ENCOUNTER — Ambulatory Visit (INDEPENDENT_AMBULATORY_CARE_PROVIDER_SITE_OTHER): Payer: Medicare Other | Admitting: Family Medicine

## 2017-07-10 VITALS — BP 104/68 | HR 70 | Temp 98.0°F | Wt 182.0 lb

## 2017-07-10 DIAGNOSIS — R5383 Other fatigue: Secondary | ICD-10-CM | POA: Diagnosis not present

## 2017-07-10 DIAGNOSIS — J069 Acute upper respiratory infection, unspecified: Secondary | ICD-10-CM

## 2017-07-10 DIAGNOSIS — R5381 Other malaise: Secondary | ICD-10-CM

## 2017-07-10 DIAGNOSIS — D509 Iron deficiency anemia, unspecified: Secondary | ICD-10-CM

## 2017-07-10 DIAGNOSIS — E538 Deficiency of other specified B group vitamins: Secondary | ICD-10-CM | POA: Diagnosis not present

## 2017-07-10 NOTE — Progress Notes (Signed)
[name] Female    DOB: 1945/12/23   71 y.o.   MRN: [id number] Visit Date: 07/10/2017  Today's Provider: Vernie Murders, PA   Chief Complaint  Patient presents with  . Cough  . Nasal Congestion  . Fatigue   Subjective:    URI   This is a new problem. Episode onset: Monday and Tuesday. The problem has been unchanged. Associated symptoms include congestion, coughing and a sore throat (scratchy- improved now). Associated symptoms comments: Fatigue . She has tried acetaminophen and antihistamine for the symptoms. The treatment provided mild relief.   Patient Active Problem List   Diagnosis Date Noted  . Cervical spondylosis 05/04/2017  . Cervical radiculopathy 05/04/2017  . Family history of Cushing disease 01/12/2017  . B12 deficiency 12/18/2016  . Mild cognitive impairment with memory loss 12/18/2016  . Parkinsonian features 12/18/2016  . Vitamin D deficiency 12/18/2016  . Chalazion, bilateral 06/08/2016  . Absolute anemia 07/30/2015  . Edema extremities 07/30/2015  . Benign essential tremor 07/30/2015  . Anxiety, generalized 07/30/2015  . Gastro-esophageal reflux disease without esophagitis 07/30/2015  . HLD (hyperlipidemia) 07/30/2015  . Cannot sleep 07/30/2015  . Lumbar radiculopathy 07/30/2015  . Depression, major, recurrent, in partial remission ([place]) 07/30/2015  . Arthritis, degenerative 07/30/2015  . Allergic rhinitis 07/30/2015  . Gastroduodenal ulcer 07/30/2015  . Calcium blood increased 07/30/2015  . Pre-syncope 05/17/2015  . Dyspnea 03/31/2015  . Essential hypertension 03/31/2015  . Bleeding stomach ulcer 10/14/2013  . Anemia, iron deficiency 10/14/2013  . Weakness generalized 10/14/2013  . Ejection fraction   . Pericardial effusion   . PONV (postoperative nausea and vomiting)   . Palpitations   . Low back pain   . Normal nuclear stress test    Past Surgical History:  Procedure Laterality Date  . BACK SURGERY  08/2010  . BLADDER  SUSPENSION  2003  . bone spur  2009   removed from right collar bone area and partial collar bone removed  . THYROIDECTOMY, PARTIAL    . TONSILLECTOMY     as child  . TUBAL LIGATION     15yrs ago  . VAGINAL HYSTERECTOMY     80yrs ago d/t cervical cancer   Family History  Problem Relation Age of Onset  . Colon cancer Mother   . Heart murmur Mother   . Heart disease Mother   . Angina Mother   . Heart disease Father   . COPD Father   . Atrial fibrillation Brother   . Cushing syndrome Daughter   . Cancer Daughter        thyroid  . Breast cancer Cousin   . Hypotension Neg Hx   . Malignant hyperthermia Neg Hx   . Pseudochol deficiency Neg Hx    Allergies  Allergen Reactions  . Anti-Inflammatory Enzyme [Nutritional Supplements] Other (See Comments)    REACTION: Due to bleeding ulcer  . Cymbalta [Duloxetine Hcl] Other (See Comments)    REACTION: Urinary retention  . Clarithromycin Other (See Comments)    Upsets stomach  . Nsaids     GI BLEED  . Ranitidine     severe diarrhea  . Venlafaxine     severe depression   Previous Medications   BIOTIN PO    Take 5,000 mcg by mouth daily.    BUSPIRONE (BUSPAR) 10 MG TABLET       CALCIUM CARBONATE-VITAMIN D (CALCIUM 600+D) 600-200 MG-UNIT TABS    Take 1 tablet by mouth daily.   CITALOPRAM (CELEXA)  40 MG TABLET    40 mg daily.    CYANOCOBALAMIN (VITAMIN B-12 IJ)    Inject as directed.   DIAZEPAM (VALIUM) 5 MG TABLET    Take 0.5-1 tablets (2.5-5 mg total) by mouth every 12 (twelve) hours as needed for anxiety.   DONEPEZIL (ARICEPT) 5 MG TABLET    Take 5 mg by mouth at bedtime.   FISH OIL-OMEGA-3 FATTY ACIDS 1000 MG CAPSULE    Take 1 g by mouth daily.     FLUTICASONE FUROATE-VILANTEROL (BREO ELLIPTA) 200-25 MCG/INH AEPB    Inhale 1 puff into the lungs daily.   METOPROLOL TARTRATE (LOPRESSOR) 25 MG TABLET    Take 0.5 tablets (12.5 mg total) by mouth 2 (two) times daily.   MONTELUKAST (SINGULAIR) 10 MG TABLET    Take 1 tablet (10 mg  total) by mouth at bedtime.   PRIMIDONE (MYSOLINE) 50 MG TABLET    Take 50 mg by mouth at bedtime.   ROSUVASTATIN (CRESTOR) 10 MG TABLET    Take 1 tablet (10 mg total) by mouth daily.   TOLTERODINE (DETROL LA) 4 MG 24 HR CAPSULE    Take 4 mg by mouth daily.   TRAZODONE HCL PO    Take 250 mg by mouth at bedtime.   VITAMIN B-12 (CYANOCOBALAMIN) 1000 MCG TABLET    Take 1,000 mcg by mouth daily.   Review of Systems  Constitutional: Positive for fatigue.  HENT: Positive for congestion and sore throat (scratchy- improved now).   Respiratory: Positive for cough.   Cardiovascular: Negative.    Social History  Substance Use Topics  . Smoking status: Never Smoker  . Smokeless tobacco: Never Used  . Alcohol use 0.0 oz/week     Comment: drinks 3-4 times a year   Objective:   BP 104/68 (BP Location: Right Arm, Patient Position: Sitting, Cuff Size: Normal)   Pulse 70   Temp 98 F (36.7 C) (Oral)   Wt 182 lb (82.6 kg)   SpO2 96%   BMI 28.72 kg/m   Physical Exam  Constitutional: She is oriented to person, place, and time. She appears well-developed and well-nourished. No distress.  HENT:  Head: Normocephalic and atraumatic.  Right Ear: Hearing normal.  Left Ear: Hearing normal.  Nose: Nose normal.  Eyes: Conjunctivae and lids are normal. Right eye exhibits no discharge. Left eye exhibits no discharge. No scleral icterus.  Neck: Neck supple.  Cardiovascular: Normal rate and regular rhythm.   Pulmonary/Chest: Effort normal and breath sounds normal. No respiratory distress.  Abdominal: Soft. Bowel sounds are normal.  Musculoskeletal: Normal range of motion.  Neurological: She is alert and oriented to person, place, and time.  Skin: Skin is intact. No lesion and no rash noted.  Psychiatric: She has a normal mood and affect. Her speech is normal and behavior is normal. Thought content normal.      Assessment & Plan:     1. Malaise and fatigue Very much fatigued. No melena or  hematochezia. Similar weakness as when she had B12 deficiency and GI bleed. Thinks it started a week ago with sore throat. Will check CBC with diff. Encouraged to use Mucinex-DM for cough and throat lozenge for sore throat. Increase fluid intake and eat 3 regular meals. Follow up pending report. - CBC with Differential/Platelet  2. Upper respiratory tract infection, unspecified type Mild congestion and general malaise with slight sore throat a week ago. No fevers or sputum production. May use Mucinex-DM prn with throat lozenge. Check CBC  with diff. - CBC with Differential/Platelet  3. B12 deficiency Diagnosed by neurologist (Dr. Manuella Ghazi) a year ago. Required B12 injections. Taking oral B12 now but not sure it is keeping the level up.  4. Iron deficiency anemia, unspecified iron deficiency anemia type History of GI bleed with iron deficiency. Will check ferritin and CBC. - Ferritin

## 2017-07-11 ENCOUNTER — Telehealth: Payer: Self-pay

## 2017-07-11 LAB — CBC WITH DIFFERENTIAL/PLATELET
Basophils Absolute: 0 10*3/uL (ref 0.0–0.2)
Basos: 1 %
EOS (ABSOLUTE): 0.1 10*3/uL (ref 0.0–0.4)
Eos: 1 %
Hematocrit: 41.7 % (ref 34.0–46.6)
Hemoglobin: 14.2 g/dL (ref 11.1–15.9)
Immature Grans (Abs): 0 10*3/uL (ref 0.0–0.1)
Immature Granulocytes: 0 %
Lymphocytes Absolute: 2.2 10*3/uL (ref 0.7–3.1)
Lymphs: 29 %
MCH: 30.1 pg (ref 26.6–33.0)
MCHC: 34.1 g/dL (ref 31.5–35.7)
MCV: 88 fL (ref 79–97)
Monocytes Absolute: 0.9 10*3/uL (ref 0.1–0.9)
Monocytes: 12 %
Neutrophils Absolute: 4.3 10*3/uL (ref 1.4–7.0)
Neutrophils: 57 %
Platelets: 317 10*3/uL (ref 150–379)
RBC: 4.72 x10E6/uL (ref 3.77–5.28)
RDW: 13 % (ref 12.3–15.4)
WBC: 7.5 10*3/uL (ref 3.4–10.8)

## 2017-07-11 LAB — FERRITIN: Ferritin: 101 ng/mL (ref 15–150)

## 2017-07-11 NOTE — Telephone Encounter (Signed)
[email or web address]

## 2017-07-11 NOTE — Telephone Encounter (Signed)
-----   Message from Margo Common, [place] sent at 07/11/2017  7:47 AM EDT ----- Blood counts and ferritin level normal. No sign of blood loss/bleed. Normal RBC size now (usually increases with B12 deficiency). If any cough or swelling of feet/ankles, should get CXR to rule out pneumonia or enlargement of heart. Proceed with seeing Dr. Manuella Ghazi as planned for recheck of B12 deficiency.

## 2017-07-11 NOTE — Telephone Encounter (Signed)
Pt advised. She is feeling better.

## 2017-07-12 ENCOUNTER — Ambulatory Visit: Payer: Medicare Other | Admitting: Family Medicine

## 2017-07-13 ENCOUNTER — Ambulatory Visit (INDEPENDENT_AMBULATORY_CARE_PROVIDER_SITE_OTHER): Payer: Medicare Other | Admitting: Family Medicine

## 2017-07-13 VITALS — BP 104/62 | HR 78 | Temp 98.7°F | Resp 14 | Wt 181.0 lb

## 2017-07-13 DIAGNOSIS — E538 Deficiency of other specified B group vitamins: Secondary | ICD-10-CM

## 2017-07-13 DIAGNOSIS — J069 Acute upper respiratory infection, unspecified: Secondary | ICD-10-CM

## 2017-07-13 DIAGNOSIS — R5383 Other fatigue: Secondary | ICD-10-CM

## 2017-07-13 DIAGNOSIS — E7800 Pure hypercholesterolemia, unspecified: Secondary | ICD-10-CM | POA: Diagnosis not present

## 2017-07-13 DIAGNOSIS — I10 Essential (primary) hypertension: Secondary | ICD-10-CM

## 2017-07-13 DIAGNOSIS — F411 Generalized anxiety disorder: Secondary | ICD-10-CM

## 2017-07-13 DIAGNOSIS — G3184 Mild cognitive impairment, so stated: Secondary | ICD-10-CM

## 2017-07-13 NOTE — Progress Notes (Signed)
Nancy Blackburn  MRN: [id number] DOB: [birth year]  Subjective:  HPI  Patient is here for 6 months follow up. Last routine office visit was on 01/12/17. Patient has been following up with neurologist on MCI, and B12. Patient has switched from B12 injections to B12 tablets and will have her levels re checked in October. Patient can tell the difference in energy level when she was getting injections vs oral B12 treatment. She has seen Dr Nicolasa Ducking and Dr Arnoldo Morale for follow up also. Patient also saw Vernie Murders, PA on 07/10/17 for URI, Fatigue issue. She is feeling better just has a cough and congestion still present.  Patient Active Problem List   Diagnosis Date Noted  . Cervical spondylosis 05/04/2017  . Cervical radiculopathy 05/04/2017  . Family history of Cushing disease 01/12/2017  . B12 deficiency 12/18/2016  . Mild cognitive impairment with memory loss 12/18/2016  . Parkinsonian features 12/18/2016  . Vitamin D deficiency 12/18/2016  . Chalazion, bilateral 06/08/2016  . Absolute anemia 07/30/2015  . Edema extremities 07/30/2015  . Benign essential tremor 07/30/2015  . Anxiety, generalized 07/30/2015  . Gastro-esophageal reflux disease without esophagitis 07/30/2015  . HLD (hyperlipidemia) 07/30/2015  . Cannot sleep 07/30/2015  . Lumbar radiculopathy 07/30/2015  . Depression, major, recurrent, in partial remission ([place]) 07/30/2015  . Arthritis, degenerative 07/30/2015  . Allergic rhinitis 07/30/2015  . Gastroduodenal ulcer 07/30/2015  . Calcium blood increased 07/30/2015  . Pre-syncope 05/17/2015  . Dyspnea 03/31/2015  . Essential hypertension 03/31/2015  . Bleeding stomach ulcer 10/14/2013  . Anemia, iron deficiency 10/14/2013  . Weakness generalized 10/14/2013  . Ejection fraction   . Pericardial effusion   . PONV (postoperative nausea and vomiting)   . Palpitations   . Low back pain   . Normal nuclear stress test     Past Medical History:  Diagnosis Date    . Chronic sinus infection   . Ejection fraction    a. EF 55-65%, echo, mild LVH, June, 2013, small pericardial effusion  . Hyperlipidemia    a. takes Simvastatin  . Hypertension    a. takes Metoprolol, Lisinopril, and amlodipine.  . Low back pain   . Mild dementia   . Nasal polyps   . Normal nuclear stress test    a. 07/2005 Nl nuc stress test.  . Palpitations    a. June 2013:  Event recorder normal sinus rhythm with rare PVCs - ? symptomatic PVC's.  . Parkinson disease ([place])   . Pericardial effusion    a. Small, echo, June, 2013  . PONV (postoperative nausea and vomiting)     Social History   Social History  . Marital status: Divorced    Spouse name: N/A  . Number of children: N/A  . Years of education: N/A   Occupational History  . Not on file.   Social History Main Topics  . Smoking status: Never Smoker  . Smokeless tobacco: Never Used  . Alcohol use 0.0 oz/week     Comment: drinks 3-4 times a year  . Drug use: No  . Sexual activity: No   Other Topics Concern  . Not on file   Social History Narrative  . No narrative on file    Outpatient Encounter Prescriptions as of 07/13/2017  Medication Sig Note  . BIOTIN PO Take 5,000 mcg by mouth daily.    . busPIRone (BUSPAR) 10 MG tablet    . Calcium Carbonate-Vitamin D (CALCIUM 600+D) 600-200 MG-UNIT TABS Take 1 tablet by mouth daily.   Marland Kitchen  citalopram (CELEXA) 40 MG tablet 40 mg daily.  08/31/2015: Received from: External Pharmacy  . donepezil (ARICEPT) 5 MG tablet Take 5 mg by mouth at bedtime.   . fish oil-omega-3 fatty acids 1000 MG capsule Take 1 g by mouth daily.     . fluticasone furoate-vilanterol (BREO ELLIPTA) 200-25 MCG/INH AEPB Inhale 1 puff into the lungs daily.   . metoprolol tartrate (LOPRESSOR) 25 MG tablet Take 0.5 tablets (12.5 mg total) by mouth 2 (two) times daily.   . montelukast (SINGULAIR) 10 MG tablet Take 1 tablet (10 mg total) by mouth at bedtime.   . primidone (MYSOLINE) 50 MG tablet Take 50 mg  by mouth at bedtime.   . rosuvastatin (CRESTOR) 10 MG tablet Take 1 tablet (10 mg total) by mouth daily. (Patient taking differently: Take 10 mg by mouth daily. )   . TRAZODONE HCL PO Take 250 mg by mouth at bedtime.   . vitamin B-12 (CYANOCOBALAMIN) 1000 MCG tablet Take 1,000 mcg by mouth daily.   Marland Kitchen tolterodine (DETROL LA) 4 MG 24 hr capsule Take 4 mg by mouth daily.   . [DISCONTINUED] Cyanocobalamin (VITAMIN B-12 IJ) Inject as directed.   . [DISCONTINUED] diazepam (VALIUM) 5 MG tablet Take 0.5-1 tablets (2.5-5 mg total) by mouth every 12 (twelve) hours as needed for anxiety. (Patient taking differently: Take 2.5-5 mg by mouth every 12 (twelve) hours as needed for anxiety. )    No facility-administered encounter medications on file as of 07/13/2017.     Allergies  Allergen Reactions  . Anti-Inflammatory Enzyme [Nutritional Supplements] Other (See Comments)    REACTION: Due to bleeding ulcer  . Cymbalta [Duloxetine Hcl] Other (See Comments)    REACTION: Urinary retention  . Clarithromycin Other (See Comments)    Upsets stomach  . Nsaids     GI BLEED  . Ranitidine     severe diarrhea  . Venlafaxine     severe depression    Review of Systems  Constitutional: Positive for malaise/fatigue (better).  Eyes: Negative.   Respiratory: Positive for cough. Negative for shortness of breath and wheezing.   Cardiovascular: Negative.   Gastrointestinal: Negative.   Musculoskeletal: Positive for joint pain (knees).  Skin: Negative.   Endo/Heme/Allergies: Negative.   Psychiatric/Behavioral: Negative.     Objective:  BP 104/62   Pulse 78   Temp 98.7 F (37.1 C)   Resp 14   Wt 181 lb (82.1 kg)   BMI 28.56 kg/m   Physical Exam  Constitutional: She is oriented to person, place, and time and well-developed, well-nourished, and in no distress.  HENT:  Head: Normocephalic and atraumatic.  Right Ear: External ear normal.  Left Ear: External ear normal.  Nose: Nose normal.  Eyes: Pupils  are equal, round, and reactive to light. Conjunctivae are normal.  Neck: Normal range of motion. Neck supple.  Cardiovascular: Normal rate, regular rhythm, normal heart sounds and intact distal pulses.  Exam reveals no gallop.   No murmur heard. Pulmonary/Chest: Effort normal and breath sounds normal. No respiratory distress. She has no wheezes.  Abdominal: Soft.  Neurological: She is alert and oriented to person, place, and time.  Skin: Skin is warm and dry.  Psychiatric: Mood, memory, affect and judgment normal.    Assessment and Plan :  1. Upper respiratory tract infection, unspecified type Doing better. 2. B12 deficiency Discussed this with patient. She may need to get back on injections, felt better on injections. Advised patient that we can do B12 injections here  if it is more convinient for the patient.  3. Mild cognitive impairment with memory loss Stable. Following up with neurologist.  4. Essential hypertension Stable.  5. Pure hypercholesterolemia  6. Other fatigue Slightly worse after been off the B12 injections and she also recovering from URI. Follow.  7. Anxiety, generalized Following up with Dr Nicolasa Ducking.  HPI, Exam and A&P transcribed by Theressa Millard, RMA under direction and in the presence of Miguel Aschoff, MD. I have done the exam and reviewed the chart and it is accurate to the best of my knowledge. Development worker, community has been used and  any errors in dictation or transcription are unintentional. Miguel Aschoff M.D. [facility]

## 2017-07-17 DIAGNOSIS — F331 Major depressive disorder, recurrent, moderate: Secondary | ICD-10-CM | POA: Diagnosis not present

## 2017-07-17 DIAGNOSIS — F5105 Insomnia due to other mental disorder: Secondary | ICD-10-CM | POA: Diagnosis not present

## 2017-07-18 ENCOUNTER — Encounter: Payer: Self-pay | Admitting: Family Medicine

## 2017-07-25 ENCOUNTER — Other Ambulatory Visit: Payer: Self-pay | Admitting: Pulmonary Disease

## 2017-08-07 DIAGNOSIS — M170 Bilateral primary osteoarthritis of knee: Secondary | ICD-10-CM | POA: Diagnosis not present

## 2017-08-16 ENCOUNTER — Ambulatory Visit (INDEPENDENT_AMBULATORY_CARE_PROVIDER_SITE_OTHER): Payer: Medicare Other | Admitting: Family Medicine

## 2017-08-16 VITALS — BP 128/74 | HR 68 | Temp 98.3°F | Resp 12 | Wt 180.6 lb

## 2017-08-16 DIAGNOSIS — E538 Deficiency of other specified B group vitamins: Secondary | ICD-10-CM | POA: Diagnosis not present

## 2017-08-16 DIAGNOSIS — B078 Other viral warts: Secondary | ICD-10-CM | POA: Diagnosis not present

## 2017-08-16 DIAGNOSIS — F3341 Major depressive disorder, recurrent, in partial remission: Secondary | ICD-10-CM

## 2017-08-16 NOTE — Progress Notes (Signed)
Nancy Blackburn  MRN: [id number] DOB: 1946/01/25  Subjective:  HPI  Patient is here to discuss getting wart off her right lower arm. It does not hurt. She keeps touching and rubbing the area since she knows it is there. This has been present for about 1 month or so.   Patient Active Problem List   Diagnosis Date Noted  . Cervical spondylosis 05/04/2017  . Cervical radiculopathy 05/04/2017  . Family history of Cushing disease 01/12/2017  . B12 deficiency 12/18/2016  . Mild cognitive impairment with memory loss 12/18/2016  . Parkinsonian features 12/18/2016  . Vitamin D deficiency 12/18/2016  . Chalazion, bilateral 06/08/2016  . Absolute anemia 07/30/2015  . Edema extremities 07/30/2015  . Benign essential tremor 07/30/2015  . Anxiety, generalized 07/30/2015  . Gastro-esophageal reflux disease without esophagitis 07/30/2015  . HLD (hyperlipidemia) 07/30/2015  . Cannot sleep 07/30/2015  . Lumbar radiculopathy 07/30/2015  . Depression, major, recurrent, in partial remission ([place]) 07/30/2015  . Arthritis, degenerative 07/30/2015  . Allergic rhinitis 07/30/2015  . Gastroduodenal ulcer 07/30/2015  . Calcium blood increased 07/30/2015  . Pre-syncope 05/17/2015  . Dyspnea 03/31/2015  . Essential hypertension 03/31/2015  . Bleeding stomach ulcer 10/14/2013  . Anemia, iron deficiency 10/14/2013  . Weakness generalized 10/14/2013  . Ejection fraction   . Pericardial effusion   . PONV (postoperative nausea and vomiting)   . Palpitations   . Low back pain   . Normal nuclear stress test     Past Medical History:  Diagnosis Date  . Chronic sinus infection   . Ejection fraction    a. EF 55-65%, echo, mild LVH, June, 2013, small pericardial effusion  . Hyperlipidemia    a. takes Simvastatin  . Hypertension    a. takes Metoprolol, Lisinopril, and amlodipine.  . Low back pain   . Mild dementia   . Nasal polyps   . Normal nuclear stress test    a. 07/2005 Nl nuc stress  test.  . Palpitations    a. June 2013:  Event recorder normal sinus rhythm with rare PVCs - ? symptomatic PVC's.  . Parkinson disease ([place])   . Pericardial effusion    a. Small, echo, June, 2013  . PONV (postoperative nausea and vomiting)     Social History   Social History  . Marital status: Divorced    Spouse name: N/A  . Number of children: N/A  . Years of education: N/A   Occupational History  . Not on file.   Social History Main Topics  . Smoking status: Never Smoker  . Smokeless tobacco: Never Used  . Alcohol use 0.0 oz/week     Comment: drinks 3-4 times a year  . Drug use: No  . Sexual activity: No   Other Topics Concern  . Not on file   Social History Narrative  . No narrative on file    Outpatient Encounter Prescriptions as of 08/16/2017  Medication Sig Note  . BIOTIN PO Take 5,000 mcg by mouth daily.    Marland Kitchen BREO ELLIPTA 200-25 MCG/INH AEPB INHALE 1 PUFF EVERY DAY   . busPIRone (BUSPAR) 10 MG tablet    . Calcium Carbonate-Vitamin D (CALCIUM 600+D) 600-200 MG-UNIT TABS Take 1 tablet by mouth daily.   . citalopram (CELEXA) 40 MG tablet 40 mg daily.  08/31/2015: Received from: External Pharmacy  . donepezil (ARICEPT) 5 MG tablet Take 5 mg by mouth at bedtime.   . fish oil-omega-3 fatty acids 1000 MG capsule Take 1 g by  mouth daily.     . metoprolol tartrate (LOPRESSOR) 25 MG tablet Take 0.5 tablets (12.5 mg total) by mouth 2 (two) times daily.   . montelukast (SINGULAIR) 10 MG tablet Take 1 tablet (10 mg total) by mouth at bedtime.   . primidone (MYSOLINE) 50 MG tablet Take 50 mg by mouth at bedtime.   . rosuvastatin (CRESTOR) 10 MG tablet Take 1 tablet (10 mg total) by mouth daily. (Patient taking differently: Take 10 mg by mouth daily. )   . tolterodine (DETROL LA) 4 MG 24 hr capsule Take 4 mg by mouth daily.   . TRAZODONE HCL PO Take 250 mg by mouth at bedtime.   . vitamin B-12 (CYANOCOBALAMIN) 1000 MCG tablet Take 1,000 mcg by mouth daily.    No  facility-administered encounter medications on file as of 08/16/2017.     Allergies  Allergen Reactions  . Anti-Inflammatory Enzyme [Nutritional Supplements] Other (See Comments)    REACTION: Due to bleeding ulcer  . Cymbalta [Duloxetine Hcl] Other (See Comments)    REACTION: Urinary retention  . Clarithromycin Other (See Comments)    Upsets stomach  . Nsaids     GI BLEED  . Ranitidine     severe diarrhea  . Venlafaxine     severe depression    Review of Systems  Constitutional: Negative.   Respiratory: Negative.   Cardiovascular: Negative.   Musculoskeletal: Negative.   Skin:       Wart on the right lower arm  Neurological: Negative.     Objective:  BP 128/74   Pulse 68   Temp 98.3 F (36.8 C)   Resp 12   Wt 180 lb 9.6 oz (81.9 kg)   BMI 28.50 kg/m   Physical Exam  Constitutional: She is oriented to person, place, and time and well-developed, well-nourished, and in no distress.  HENT:  Head: Normocephalic and atraumatic.  Eyes: Conjunctivae are normal. No scleral icterus.  Neck: No thyromegaly present.  Cardiovascular: Normal rate and regular rhythm.   Pulmonary/Chest: Effort normal and breath sounds normal.  Neurological: She is alert and oriented to person, place, and time.  Skin: Skin is warm and dry.  Wart frozen on forearm.  Psychiatric: Mood, memory, affect and judgment normal.    Assessment and Plan :  Wart Frozen with cryopen. B12 deficiency PD MDD  I have done the exam and reviewed the chart and it is accurate to the best of my knowledge. Development worker, community has been used and  any errors in dictation or transcription are unintentional. Miguel Aschoff M.D. [facility]

## 2017-08-28 DIAGNOSIS — F331 Major depressive disorder, recurrent, moderate: Secondary | ICD-10-CM | POA: Diagnosis not present

## 2017-08-28 DIAGNOSIS — F5105 Insomnia due to other mental disorder: Secondary | ICD-10-CM | POA: Diagnosis not present

## 2017-08-30 DIAGNOSIS — R350 Frequency of micturition: Secondary | ICD-10-CM | POA: Diagnosis not present

## 2017-09-20 DIAGNOSIS — G3184 Mild cognitive impairment, so stated: Secondary | ICD-10-CM | POA: Diagnosis not present

## 2017-09-20 DIAGNOSIS — G250 Essential tremor: Secondary | ICD-10-CM | POA: Diagnosis not present

## 2017-09-21 DIAGNOSIS — H43813 Vitreous degeneration, bilateral: Secondary | ICD-10-CM | POA: Diagnosis not present

## 2017-10-05 ENCOUNTER — Ambulatory Visit (INDEPENDENT_AMBULATORY_CARE_PROVIDER_SITE_OTHER): Payer: Medicare Other | Admitting: Family Medicine

## 2017-10-05 VITALS — BP 118/72 | HR 60 | Temp 98.0°F | Resp 14 | Wt 183.4 lb

## 2017-10-05 DIAGNOSIS — F3341 Major depressive disorder, recurrent, in partial remission: Secondary | ICD-10-CM | POA: Diagnosis not present

## 2017-10-05 DIAGNOSIS — K591 Functional diarrhea: Secondary | ICD-10-CM | POA: Diagnosis not present

## 2017-10-05 DIAGNOSIS — I10 Essential (primary) hypertension: Secondary | ICD-10-CM | POA: Diagnosis not present

## 2017-10-05 DIAGNOSIS — Z23 Encounter for immunization: Secondary | ICD-10-CM

## 2017-10-05 DIAGNOSIS — E7800 Pure hypercholesterolemia, unspecified: Secondary | ICD-10-CM

## 2017-10-05 NOTE — Patient Instructions (Signed)
Start taking Probiotic daily until Thanksgiving. Take Imodium on the days you have to go out in case you have diarrhea.

## 2017-10-05 NOTE — Progress Notes (Signed)
Nancy Blackburn  MRN: [id number] DOB: 1946/05/25  Subjective:  HPI  Patient is here to discuss diarrhea that has been present for over a month or 2 months. Some days has diarrhea 2 to 3 times a day, most of the time bowels are explosive. Her normal bowel movement daily is usually about 1 time.The times she does have a normal stool it is a very small amount and not fully shaped. At first she thought this all started because she was taking Buspar so she stopped taking it but no improvement was noticed so she is back on Buspar at this time. She has not been eating anything unusual. Most of the time diarrhea starts after eating but not every time. She has city water. When she does have the urge to use the bathroom she has to go quick, she did have a few accidents at home when she did not make it to the bathroom quick enough. When she takes Immodium it does help to improve her bowels but she does not feel that she should be taking this every day all the time. No nausea, no vomiting, no fever, no abdominal pain or bowel pain present.  Wt Readings from Last 3 Encounters:  10/05/17 183 lb 6.4 oz (83.2 kg)  08/16/17 180 lb 9.6 oz (81.9 kg)  07/13/17 181 lb (82.1 kg)    Patient Active Problem List   Diagnosis Date Noted  . Cervical spondylosis 05/04/2017  . Cervical radiculopathy 05/04/2017  . Family history of Cushing disease 01/12/2017  . B12 deficiency 12/18/2016  . Mild cognitive impairment with memory loss 12/18/2016  . Parkinsonian features 12/18/2016  . Vitamin D deficiency 12/18/2016  . Chalazion, bilateral 06/08/2016  . Absolute anemia 07/30/2015  . Edema extremities 07/30/2015  . Benign essential tremor 07/30/2015  . Anxiety, generalized 07/30/2015  . Gastro-esophageal reflux disease without esophagitis 07/30/2015  . HLD (hyperlipidemia) 07/30/2015  . Cannot sleep 07/30/2015  . Lumbar radiculopathy 07/30/2015  . Depression, major, recurrent, in partial remission ([place])  07/30/2015  . Arthritis, degenerative 07/30/2015  . Allergic rhinitis 07/30/2015  . Gastroduodenal ulcer 07/30/2015  . Calcium blood increased 07/30/2015  . Pre-syncope 05/17/2015  . Dyspnea 03/31/2015  . Essential hypertension 03/31/2015  . Bleeding stomach ulcer 10/14/2013  . Anemia, iron deficiency 10/14/2013  . Weakness generalized 10/14/2013  . Ejection fraction   . Pericardial effusion   . PONV (postoperative nausea and vomiting)   . Palpitations   . Low back pain   . Normal nuclear stress test     Past Medical History:  Diagnosis Date  . Chronic sinus infection   . Ejection fraction    a. EF 55-65%, echo, mild LVH, June, 2013, small pericardial effusion  . Hyperlipidemia    a. takes Simvastatin  . Hypertension    a. takes Metoprolol, Lisinopril, and amlodipine.  . Low back pain   . Mild dementia   . Nasal polyps   . Normal nuclear stress test    a. 07/2005 Nl nuc stress test.  . Palpitations    a. June 2013:  Event recorder normal sinus rhythm with rare PVCs - ? symptomatic PVC's.  . Parkinson disease ([place])   . Pericardial effusion    a. Small, echo, June, 2013  . PONV (postoperative nausea and vomiting)     Social History   Social History  . Marital status: Divorced    Spouse name: N/A  . Number of children: N/A  . Years of education: N/A  Occupational History  . Not on file.   Social History Main Topics  . Smoking status: Never Smoker  . Smokeless tobacco: Never Used  . Alcohol use 0.0 oz/week     Comment: drinks 3-4 times a year  . Drug use: No  . Sexual activity: No   Other Topics Concern  . Not on file   Social History Narrative  . No narrative on file    Outpatient Encounter Prescriptions as of 10/05/2017  Medication Sig Note  . BIOTIN PO Take 5,000 mcg by mouth daily.    Marland Kitchen BREO ELLIPTA 200-25 MCG/INH AEPB INHALE 1 PUFF EVERY DAY   . busPIRone (BUSPAR) 10 MG tablet    . Calcium Carbonate-Vitamin D (CALCIUM 600+D) 600-200 MG-UNIT  TABS Take 1 tablet by mouth daily.   . citalopram (CELEXA) 40 MG tablet 40 mg daily.  08/31/2015: Received from: External Pharmacy  . donepezil (ARICEPT) 5 MG tablet Take 5 mg by mouth at bedtime.   . fish oil-omega-3 fatty acids 1000 MG capsule Take 1 g by mouth daily.     . fluticasone (FLONASE) 50 MCG/ACT nasal spray    . metoprolol tartrate (LOPRESSOR) 25 MG tablet Take 0.5 tablets (12.5 mg total) by mouth 2 (two) times daily.   . montelukast (SINGULAIR) 10 MG tablet Take 1 tablet (10 mg total) by mouth at bedtime.   . primidone (MYSOLINE) 50 MG tablet Take 50 mg by mouth at bedtime.   . rosuvastatin (CRESTOR) 10 MG tablet Take 1 tablet (10 mg total) by mouth daily. (Patient taking differently: Take 10 mg by mouth daily. )   . tolterodine (DETROL LA) 4 MG 24 hr capsule Take 4 mg by mouth daily.   . TRAZODONE HCL PO Take 250 mg by mouth at bedtime.   Marland Kitchen trimethoprim (TRIMPEX) 100 MG tablet    . vitamin B-12 (CYANOCOBALAMIN) 1000 MCG tablet Take 1,000 mcg by mouth daily.    No facility-administered encounter medications on file as of 10/05/2017.     Allergies  Allergen Reactions  . Anti-Inflammatory Enzyme [Nutritional Supplements] Other (See Comments)    REACTION: Due to bleeding ulcer  . Cymbalta [Duloxetine Hcl] Other (See Comments)    REACTION: Urinary retention  . Clarithromycin Other (See Comments)    Upsets stomach  . Nsaids     GI BLEED  . Ranitidine     severe diarrhea  . Venlafaxine     severe depression    Review of Systems  Constitutional: Negative.   Eyes: Negative.   Respiratory: Negative.   Cardiovascular: Negative.   Gastrointestinal: Positive for diarrhea. Negative for abdominal pain, nausea and vomiting.  Musculoskeletal: Positive for back pain and joint pain.  Neurological: Negative.   Endo/Heme/Allergies: Negative.   Psychiatric/Behavioral: Negative.     Objective:  BP 118/72   Pulse 60   Temp 98 F (36.7 C)   Resp 14   Wt 183 lb 6.4 oz (83.2 kg)    BMI 28.94 kg/m   Physical Exam  Constitutional: She is oriented to person, place, and time and well-developed, well-nourished, and in no distress.  HENT:  Head: Normocephalic and atraumatic.  Right Ear: External ear normal.  Left Ear: External ear normal.  Nose: Nose normal.  Eyes: Pupils are equal, round, and reactive to light. Conjunctivae are normal. No scleral icterus.  Neck: Normal range of motion. Neck supple. No thyromegaly present.  Cardiovascular: Normal rate, regular rhythm, normal heart sounds and intact distal pulses.  Exam reveals no gallop.  No murmur heard. Pulmonary/Chest: Effort normal and breath sounds normal. No respiratory distress. She has no wheezes.  Abdominal: Soft. She exhibits no distension. There is no tenderness.  Neurological: She is alert and oriented to person, place, and time. Gait normal. GCS score is 15.  Skin: Skin is warm and dry.  Psychiatric: Mood, memory, affect and judgment normal.    Assessment and Plan :  1. Functional diarrhea Discussed this in detail. Patient advised to try and take Probiotic daily until Thanksgiving at least. Take Imodium on days patient has to go out from her house. If by Thanksgiving symptoms do not improve or resolve patient advised to let us know and may need further treatment, will refer to Dr Allen Norris at that time.  2. Depression, major, recurrent, in partial remission ([place]) Stable.  3. Essential hypertension Stable.  4. Pure hypercholesterolemia  HPI, Exam and A&P transcribed by Tiffany Kocher, RMA under direction and in the presence of Miguel Aschoff, MD. I have done the exam and reviewed the chart and it is accurate to the best of my knowledge. Development worker, community has been used and  any errors in dictation or transcription are unintentional. Miguel Aschoff M.D. [facility]

## 2017-10-07 ENCOUNTER — Ambulatory Visit: Payer: Medicare Other | Admitting: Family Medicine

## 2017-10-17 DIAGNOSIS — F5105 Insomnia due to other mental disorder: Secondary | ICD-10-CM | POA: Diagnosis not present

## 2017-10-17 DIAGNOSIS — F331 Major depressive disorder, recurrent, moderate: Secondary | ICD-10-CM | POA: Diagnosis not present

## 2017-11-07 ENCOUNTER — Ambulatory Visit (INDEPENDENT_AMBULATORY_CARE_PROVIDER_SITE_OTHER): Payer: Medicare Other | Admitting: Family Medicine

## 2017-11-07 ENCOUNTER — Encounter: Payer: Self-pay | Admitting: Family Medicine

## 2017-11-07 VITALS — BP 116/64 | HR 67 | Temp 98.4°F | Resp 16 | Wt 188.4 lb

## 2017-11-07 DIAGNOSIS — R040 Epistaxis: Secondary | ICD-10-CM

## 2017-11-07 MED ORDER — PREDNISONE 10 MG PO TABS: ORAL_TABLET | ORAL | 0 refills | Status: DC

## 2017-11-07 NOTE — Progress Notes (Signed)
Subjective:     Patient ID: Nancy Blackburn, female   DOB: 11/08/1946, 71 y.o.   MRN: [id number] Chief Complaint  Patient presents with  . Sinus Problem    Patient comes in office today with complaints of sinus pain and pressure for the past severa days. Patient states that she has had two episodes where her nostril bled on the left side. Patient states that nose bleeds last about 45min and slow down when she applies ice to nose. Patient states that she has tried taking otc Sudafed PE and Flonase.    HPI Reports heat pump at her home. Reports left sided nose bleeds on two occasions-11/05/10/26. Has appointment next week with ENT.  Review of Systems     Objective:   Physical Exam  Constitutional: She appears well-developed and well-nourished. No distress.  HENT:  Right nares clear. Left nares with dried blood no lesion noted.       Assessment:    1. Left-sided epistaxis: try saline nasal spray 2-3 x day. Nasal decongestant spray for recurrence of nose bleed. - predniSONE (DELTASONE) 10 MG tablet; Taper daily as follows: 6 pills, 5, 4, 3, 2, 1  Dispense: 21 tablet; Refill: 0    Plan:    Use prednisone if sprays not effective.

## 2017-11-07 NOTE — Patient Instructions (Addendum)
Start saline nasal spray to humidify 2-3 x day. If bleeding recurs use a nasal decongestant spray like Afrin after bleeding stops.

## 2017-11-08 DIAGNOSIS — M170 Bilateral primary osteoarthritis of knee: Secondary | ICD-10-CM | POA: Diagnosis not present

## 2017-11-15 DIAGNOSIS — J019 Acute sinusitis, unspecified: Secondary | ICD-10-CM | POA: Diagnosis not present

## 2017-11-15 DIAGNOSIS — R040 Epistaxis: Secondary | ICD-10-CM | POA: Diagnosis not present

## 2017-11-17 ENCOUNTER — Other Ambulatory Visit: Payer: Self-pay | Admitting: Family Medicine

## 2017-11-17 MED ORDER — METOPROLOL TARTRATE 25 MG PO TABS: 12.5000 mg | ORAL_TABLET | Freq: Two times a day (BID) | ORAL | 3 refills | Status: DC

## 2017-11-17 NOTE — Telephone Encounter (Signed)
[facility] pharmacy faxed a refill request for the following medication. Thanks CC  metoprolol tartrate (LOPRESSOR) 25 MG tablet

## 2017-11-23 ENCOUNTER — Ambulatory Visit (INDEPENDENT_AMBULATORY_CARE_PROVIDER_SITE_OTHER): Payer: Medicare Other | Admitting: Family Medicine

## 2017-11-23 ENCOUNTER — Encounter: Payer: Self-pay | Admitting: Family Medicine

## 2017-11-23 VITALS — BP 112/60 | HR 76 | Temp 98.3°F | Resp 16 | Wt 186.0 lb

## 2017-11-23 DIAGNOSIS — E785 Hyperlipidemia, unspecified: Secondary | ICD-10-CM | POA: Diagnosis not present

## 2017-11-23 DIAGNOSIS — F32A Depression, unspecified: Secondary | ICD-10-CM

## 2017-11-23 DIAGNOSIS — R05 Cough: Secondary | ICD-10-CM | POA: Diagnosis not present

## 2017-11-23 DIAGNOSIS — I10 Essential (primary) hypertension: Secondary | ICD-10-CM | POA: Diagnosis not present

## 2017-11-23 DIAGNOSIS — D649 Anemia, unspecified: Secondary | ICD-10-CM | POA: Diagnosis not present

## 2017-11-23 DIAGNOSIS — E538 Deficiency of other specified B group vitamins: Secondary | ICD-10-CM

## 2017-11-23 DIAGNOSIS — J069 Acute upper respiratory infection, unspecified: Secondary | ICD-10-CM

## 2017-11-23 DIAGNOSIS — F329 Major depressive disorder, single episode, unspecified: Secondary | ICD-10-CM | POA: Diagnosis not present

## 2017-11-23 DIAGNOSIS — R059 Cough, unspecified: Secondary | ICD-10-CM

## 2017-11-23 MED ORDER — HYDROCODONE-HOMATROPINE 5-1.5 MG/5ML PO SYRP: 5.0000 mL | ORAL_SOLUTION | Freq: Four times a day (QID) | ORAL | 0 refills | Status: DC | PRN

## 2017-11-23 NOTE — Progress Notes (Signed)
[name] Female    DOB: 01-12-1946   71 y.o.   MRN: [id number] Visit Date: 11/23/2017  Today's Provider: Wilhemena Durie, MD   Chief Complaint  Patient presents with  . Follow-up   Subjective:    HPI Pt is here for a 4 month follow up of chronic problems and a 1 month follow up of diarrhea. She reports that she is taking the probiotics but has been out this last couple of days because of the weather. She reports that the diarrhea was better but she was started on an antibiotic almost a week ago for sinus pressure and nose bleeds. She thinks this may have upset her stomach again. She plans to restart the probiotics.    Per Dr. Manuella Ghazi he wanted pt to have labs every 6 months.     Allergies  Allergen Reactions  . Anti-Inflammatory Enzyme [Nutritional Supplements] Other (See Comments)    REACTION: Due to bleeding ulcer  . Cymbalta [Duloxetine Hcl] Other (See Comments)    REACTION: Urinary retention  . Clarithromycin Other (See Comments)    Upsets stomach  . Nsaids     GI BLEED  . Ranitidine     severe diarrhea  . Venlafaxine     severe depression     Current Outpatient Medications:  .  amoxicillin-clavulanate (AUGMENTIN) 875-125 MG tablet, , Disp: , Rfl:  .  BIOTIN PO, Take 5,000 mcg by mouth daily. , Disp: , Rfl:  .  BREO ELLIPTA 200-25 MCG/INH AEPB, INHALE 1 PUFF EVERY DAY, Disp: 180 each, Rfl: 0 .  busPIRone (BUSPAR) 10 MG tablet, , Disp: , Rfl:  .  Calcium Carbonate-Vitamin D (CALCIUM 600+D) 600-200 MG-UNIT TABS, Take 1 tablet by mouth daily., Disp: , Rfl:  .  citalopram (CELEXA) 40 MG tablet, 40 mg daily. , Disp: , Rfl:  .  donepezil (ARICEPT) 5 MG tablet, Take 5 mg by mouth at bedtime., Disp: , Rfl:  .  fish oil-omega-3 fatty acids 1000 MG capsule, Take 1 g by mouth daily.  , Disp: , Rfl:  .  fluticasone (FLONASE) 50 MCG/ACT nasal spray, , Disp: , Rfl:  .  metoprolol tartrate (LOPRESSOR) 25 MG tablet, Take 0.5 tablets (12.5 mg total) by  mouth 2 (two) times daily., Disp: 90 tablet, Rfl: 3 .  primidone (MYSOLINE) 50 MG tablet, Take 50 mg by mouth at bedtime., Disp: , Rfl:  .  rosuvastatin (CRESTOR) 10 MG tablet, Take 1 tablet (10 mg total) by mouth daily. (Patient taking differently: Take 10 mg by mouth daily. ), Disp: 90 tablet, Rfl: 3 .  traZODone (DESYREL) 100 MG tablet, , Disp: , Rfl:  .  trimethoprim (TRIMPEX) 100 MG tablet, , Disp: , Rfl:  .  vitamin B-12 (CYANOCOBALAMIN) 1000 MCG tablet, Take 1,000 mcg by mouth daily., Disp: , Rfl:  .  montelukast (SINGULAIR) 10 MG tablet, Take 1 tablet (10 mg total) by mouth at bedtime. (Patient not taking: Reported on 11/23/2017), Disp: 90 tablet, Rfl: 3 .  predniSONE (DELTASONE) 10 MG tablet, Taper daily as follows: 6 pills, 5, 4, 3, 2, 1 (Patient not taking: Reported on 11/23/2017), Disp: 21 tablet, Rfl: 0 .  tolterodine (DETROL LA) 4 MG 24 hr capsule, Take 4 mg by mouth daily., Disp: , Rfl:   Review of Systems  Constitutional: Negative.   HENT: Positive for congestion.   Eyes: Negative.   Respiratory: Positive for cough.   Cardiovascular: Negative.   Gastrointestinal: Positive for  diarrhea.  Endocrine: Negative.   Genitourinary: Negative.   Musculoskeletal: Negative.   Skin: Negative.   Allergic/Immunologic: Negative.   Neurological: Negative.   Hematological: Negative.   Psychiatric/Behavioral: Negative.     Social History   Tobacco Use  . Smoking status: Never Smoker  . Smokeless tobacco: Never Used  Substance Use Topics  . Alcohol use: Yes    Alcohol/week: 0.0 oz    Comment: drinks 3-4 times a year   Objective:   BP 112/60 (BP Location: Left Arm, Patient Position: Sitting, Cuff Size: Normal)   Pulse 76   Temp 98.3 F (36.8 C) (Oral)   Resp 16   Wt 186 lb (84.4 kg)   SpO2 97%   BMI 29.35 kg/m  Vitals:   11/23/17 1346  BP: 112/60  Pulse: 76  Resp: 16  Temp: 98.3 F (36.8 C)  TempSrc: Oral  SpO2: 97%  Weight: 186 lb (84.4 kg)     Physical Exam    Constitutional: She is oriented to person, place, and time. She appears well-developed and well-nourished.  HENT:  Head: Normocephalic and atraumatic.  Right Ear: External ear normal.  Left Ear: External ear normal.  Nose: Nose normal.  Mouth/Throat: Oropharynx is clear and moist.  Eyes: Conjunctivae are normal. No scleral icterus.  Neck: No thyromegaly present.  Cardiovascular: Normal rate, regular rhythm, normal heart sounds and intact distal pulses.  Pulmonary/Chest: Effort normal.  Abdominal: Soft. She exhibits distension. She exhibits no mass. There is no tenderness.  Very mild distension.  Lymphadenopathy:    She has no cervical adenopathy.  Neurological: She is alert and oriented to person, place, and time.  Skin: Skin is warm and dry.  Psychiatric: She has a normal mood and affect. Her behavior is normal. Judgment and thought content normal.        Assessment & Plan:     1. Cough  - HYDROcodone-homatropine (HYCODAN) 5-1.5 MG/5ML syrup; Take 5 mLs by mouth every 6 (six) hours as needed for cough.  Dispense: 120 mL; Refill: 0  2. Upper respiratory tract infection, unspecified type  - HYDROcodone-homatropine (HYCODAN) 5-1.5 MG/5ML syrup; Take 5 mLs by mouth every 6 (six) hours as needed for cough.  Dispense: 120 mL; Refill: 0  3. Vitamin B12 deficiency  - Vitamin B12  4. Depression, unspecified depression type   5. Essential hypertension  - CBC with Differential/Platelet - Comprehensive metabolic panel  6. Hyperlipidemia, unspecified hyperlipidemia type   7. Anemia, unspecified type  - Vitamin B12 - Folate 8.  Diarrhea This completely resolved and started again about 1 week ago.  Obtain labs and then try a probiotic daily.  May need GI referral or workup for this.       Is just 2 minutes ago service I have done the exam and reviewed the above chart and it is accurate to the best of my knowledge. Development worker, community has been used in this note in any air  is in the dictation or transcription are unintentional. A 1 week or 2 after that we may get on the morning the Wilhemena Durie, MD  [place]

## 2017-11-24 LAB — CBC WITH DIFFERENTIAL/PLATELET
Basophils Absolute: 29 cells/uL (ref 0–200)
Basophils Relative: 0.3 %
Eosinophils Absolute: 69 cells/uL (ref 15–500)
Eosinophils Relative: 0.7 %
HCT: 39.9 % (ref 35.0–45.0)
Hemoglobin: 13.3 g/dL (ref 11.7–15.5)
Lymphs Abs: 1499 cells/uL (ref 850–3900)
MCH: 30 pg (ref 27.0–33.0)
MCHC: 33.3 g/dL (ref 32.0–36.0)
MCV: 89.9 fL (ref 80.0–100.0)
MPV: 10.1 fL (ref 7.5–12.5)
Monocytes Relative: 17.5 %
Neutro Abs: 6488 cells/uL (ref 1500–7800)
Neutrophils Relative %: 66.2 %
Platelets: 243 10*3/uL (ref 140–400)
RBC: 4.44 10*6/uL (ref 3.80–5.10)
RDW: 11.5 % (ref 11.0–15.0)
Total Lymphocyte: 15.3 %
WBC mixed population: 1715 cells/uL — ABNORMAL HIGH (ref 200–950)
WBC: 9.8 10*3/uL (ref 3.8–10.8)

## 2017-11-24 LAB — COMPLETE METABOLIC PANEL WITH GFR
AG Ratio: 1.4 (calc) (ref 1.0–2.5)
ALT: 15 U/L (ref 6–29)
AST: 16 U/L (ref 10–35)
Albumin: 3.4 g/dL — ABNORMAL LOW (ref 3.6–5.1)
Alkaline phosphatase (APISO): 92 U/L (ref 33–130)
BUN/Creatinine Ratio: 14 (calc) (ref 6–22)
BUN: 14 mg/dL (ref 7–25)
CO2: 29 mmol/L (ref 20–32)
Calcium: 9.1 mg/dL (ref 8.6–10.4)
Chloride: 102 mmol/L (ref 98–110)
Creat: 1.02 mg/dL — ABNORMAL HIGH (ref 0.60–0.93)
GFR, Est African American: 64 mL/min/{1.73_m2} (ref >=60)
GFR, Est Non African American: 55 mL/min/{1.73_m2} — ABNORMAL LOW (ref >=60)
Globulin: 2.4 g/dL (calc) (ref 1.9–3.7)
Glucose, Bld: 85 mg/dL (ref 65–99)
Potassium: 3.9 mmol/L (ref 3.5–5.3)
Sodium: 139 mmol/L (ref 135–146)
Total Bilirubin: 0.4 mg/dL (ref 0.2–1.2)
Total Protein: 5.8 g/dL — ABNORMAL LOW (ref 6.1–8.1)

## 2017-11-24 LAB — FOLATE: Folate: >24 ng/mL

## 2017-11-24 LAB — VITAMIN B12: Vitamin B-12: 608 pg/mL (ref 200–1100)

## 2017-11-27 ENCOUNTER — Other Ambulatory Visit: Payer: Self-pay | Admitting: Family Medicine

## 2017-11-27 NOTE — Telephone Encounter (Signed)
[facility] faxed refill request for the following medications:  metoprolol tartrate (LOPRESSOR) 25 MG tablet   90 day supply  Last Rx: 11/17/17 was sent for 90 tablets &since pt takes medication twice a day 180 tablets would be 90 day supply.  LOV: 11/23/17 Please advise. Thanks TNP

## 2017-11-28 ENCOUNTER — Telehealth: Payer: Self-pay

## 2017-11-28 MED ORDER — METOPROLOL TARTRATE 25 MG PO TABS: 25.0000 mg | ORAL_TABLET | Freq: Two times a day (BID) | ORAL | 3 refills | Status: DC

## 2017-11-28 NOTE — Telephone Encounter (Signed)
Patient advised as below.  

## 2017-11-28 NOTE — Telephone Encounter (Signed)
-----   Message from Jerrol Banana., MD sent at 11/28/2017  9:22 AM EST ----- Labs OK

## 2017-11-29 ENCOUNTER — Other Ambulatory Visit: Payer: Self-pay | Admitting: Pulmonary Disease

## 2017-12-13 DIAGNOSIS — M5442 Lumbago with sciatica, left side: Secondary | ICD-10-CM | POA: Diagnosis not present

## 2017-12-13 DIAGNOSIS — M545 Low back pain: Secondary | ICD-10-CM | POA: Diagnosis not present

## 2017-12-14 ENCOUNTER — Other Ambulatory Visit: Payer: Self-pay | Admitting: Neurosurgery

## 2017-12-14 DIAGNOSIS — M5442 Lumbago with sciatica, left side: Secondary | ICD-10-CM

## 2017-12-17 ENCOUNTER — Ambulatory Visit
Admission: RE | Admit: 2017-12-17 | Discharge: 2017-12-17 | Disposition: A | Discharge status: Home / Self Care | Payer: Medicare Other | Source: Ambulatory Visit | Attending: Neurosurgery | Admitting: Neurosurgery

## 2017-12-17 DIAGNOSIS — M5442 Lumbago with sciatica, left side: Secondary | ICD-10-CM

## 2017-12-17 DIAGNOSIS — M48061 Spinal stenosis, lumbar region without neurogenic claudication: Secondary | ICD-10-CM | POA: Diagnosis not present

## 2017-12-17 MED ORDER — GADOBENATE DIMEGLUMINE 529 MG/ML IV SOLN
15.0000 mL | Freq: Once | INTRAVENOUS | Status: AC | PRN
  Administered 2017-12-17: 15 mL via INTRAVENOUS

## 2017-12-19 DIAGNOSIS — R030 Elevated blood-pressure reading, without diagnosis of hypertension: Secondary | ICD-10-CM | POA: Diagnosis not present

## 2017-12-19 DIAGNOSIS — Z6828 Body mass index (BMI) 28.0-28.9, adult: Secondary | ICD-10-CM | POA: Diagnosis not present

## 2017-12-19 DIAGNOSIS — M8448XA Pathological fracture, other site, initial encounter for fracture: Secondary | ICD-10-CM | POA: Diagnosis not present

## 2018-01-01 ENCOUNTER — Ambulatory Visit (INDEPENDENT_AMBULATORY_CARE_PROVIDER_SITE_OTHER): Payer: Medicare Other | Admitting: Family Medicine

## 2018-01-01 ENCOUNTER — Other Ambulatory Visit: Payer: Self-pay

## 2018-01-01 VITALS — BP 120/80 | HR 68 | Temp 97.5°F | Resp 16 | Wt 186.0 lb

## 2018-01-01 DIAGNOSIS — S32110S Nondisplaced Zone I fracture of sacrum, sequela: Secondary | ICD-10-CM

## 2018-01-01 DIAGNOSIS — M810 Age-related osteoporosis without current pathological fracture: Secondary | ICD-10-CM | POA: Diagnosis not present

## 2018-01-01 MED ORDER — ALENDRONATE SODIUM 70 MG PO TABS: 70.0000 mg | ORAL_TABLET | ORAL | 11 refills | Status: DC

## 2018-01-01 NOTE — Progress Notes (Signed)
Nancy Blackburn  MRN: [id number] DOB: 03/03/1946  Subjective:  HPI   The patient is a 72 year old female who presents for follow up/evaluation of visit with Dr Arnoldo Morale and his recommendations on her sacral fracture.  Patient reports he has recommended that she get a BMD through her primary care provider.  Her last BMD was in 2013 and showed osteopenia.  Patient Active Problem List   Diagnosis Date Noted  . Cervical spondylosis 05/04/2017  . Cervical radiculopathy 05/04/2017  . Family history of Cushing disease 01/12/2017  . B12 deficiency 12/18/2016  . Mild cognitive impairment with memory loss 12/18/2016  . Parkinsonian features 12/18/2016  . Vitamin D deficiency 12/18/2016  . Chalazion, bilateral 06/08/2016  . Absolute anemia 07/30/2015  . Edema extremities 07/30/2015  . Benign essential tremor 07/30/2015  . Anxiety, generalized 07/30/2015  . Gastro-esophageal reflux disease without esophagitis 07/30/2015  . HLD (hyperlipidemia) 07/30/2015  . Cannot sleep 07/30/2015  . Lumbar radiculopathy 07/30/2015  . Depression, major, recurrent, in partial remission ([place]) 07/30/2015  . Arthritis, degenerative 07/30/2015  . Allergic rhinitis 07/30/2015  . Gastroduodenal ulcer 07/30/2015  . Calcium blood increased 07/30/2015  . Pre-syncope 05/17/2015  . Dyspnea 03/31/2015  . Essential hypertension 03/31/2015  . Bleeding stomach ulcer 10/14/2013  . Anemia, iron deficiency 10/14/2013  . Weakness generalized 10/14/2013  . Ejection fraction   . Pericardial effusion   . Palpitations   . Low back pain   . Normal nuclear stress test     Past Medical History:  Diagnosis Date  . Chronic sinus infection   . Ejection fraction    a. EF 55-65%, echo, mild LVH, June, 2013, small pericardial effusion  . Hyperlipidemia    a. takes Simvastatin  . Hypertension    a. takes Metoprolol, Lisinopril, and amlodipine.  . Low back pain   . Mild dementia   . Nasal polyps   . Normal nuclear  stress test    a. 07/2005 Nl nuc stress test.  . Palpitations    a. June 2013:  Event recorder normal sinus rhythm with rare PVCs - ? symptomatic PVC's.  . Parkinson disease ([place])   . Pericardial effusion    a. Small, echo, June, 2013  . PONV (postoperative nausea and vomiting)     Social History   Socioeconomic History  . Marital status: Divorced    Spouse name: Not on file  . Number of children: Not on file  . Years of education: Not on file  . Highest education level: Not on file  Social Needs  . Financial resource strain: Not on file  . Food insecurity - worry: Not on file  . Food insecurity - inability: Not on file  . Transportation needs - medical: Not on file  . Transportation needs - non-medical: Not on file  Occupational History  . Not on file  Tobacco Use  . Smoking status: Never Smoker  . Smokeless tobacco: Never Used  Substance and Sexual Activity  . Alcohol use: Yes    Alcohol/week: 0.0 oz    Comment: drinks 3-4 times a year  . Drug use: No  . Sexual activity: No  Other Topics Concern  . Not on file  Social History Narrative  . Not on file    Outpatient Encounter Medications as of 01/01/2018  Medication Sig Note  . BIOTIN PO Take 5,000 mcg by mouth daily.    Marland Kitchen BREO ELLIPTA 200-25 MCG/INH AEPB INHALE 1 PUFF EVERY DAY   . busPIRone (  BUSPAR) 10 MG tablet    . Calcium Carbonate-Vitamin D (CALCIUM 600+D) 600-200 MG-UNIT TABS Take 1 tablet by mouth daily.   . citalopram (CELEXA) 40 MG tablet 40 mg daily.  08/31/2015: Received from: External Pharmacy  . donepezil (ARICEPT) 5 MG tablet Take 5 mg by mouth at bedtime.   . fish oil-omega-3 fatty acids 1000 MG capsule Take 1 g by mouth daily.     . fluticasone (FLONASE) 50 MCG/ACT nasal spray    . metoprolol tartrate (LOPRESSOR) 25 MG tablet Take 1 tablet (25 mg total) by mouth 2 (two) times daily.   . primidone (MYSOLINE) 50 MG tablet Take 50 mg by mouth at bedtime.   . rosuvastatin (CRESTOR) 10 MG tablet Take 1  tablet (10 mg total) by mouth daily. (Patient taking differently: Take 10 mg by mouth daily. )   . traZODone (DESYREL) 100 MG tablet    . vitamin B-12 (CYANOCOBALAMIN) 1000 MCG tablet Take 1,000 mcg by mouth daily.   . [DISCONTINUED] amoxicillin-clavulanate (AUGMENTIN) 875-125 MG tablet    . [DISCONTINUED] HYDROcodone-homatropine (HYCODAN) 5-1.5 MG/5ML syrup Take 5 mLs by mouth every 6 (six) hours as needed for cough.   . [DISCONTINUED] montelukast (SINGULAIR) 10 MG tablet Take 1 tablet (10 mg total) by mouth at bedtime. (Patient not taking: Reported on 11/23/2017)   . [DISCONTINUED] predniSONE (DELTASONE) 10 MG tablet Taper daily as follows: 6 pills, 5, 4, 3, 2, 1 (Patient not taking: Reported on 11/23/2017)   . [DISCONTINUED] tolterodine (DETROL LA) 4 MG 24 hr capsule Take 4 mg by mouth daily.   . [DISCONTINUED] trimethoprim (TRIMPEX) 100 MG tablet     No facility-administered encounter medications on file as of 01/01/2018.     Allergies  Allergen Reactions  . Anti-Inflammatory Enzyme [Nutritional Supplements] Other (See Comments)    REACTION: Due to bleeding ulcer  . Cymbalta [Duloxetine Hcl] Other (See Comments)    REACTION: Urinary retention  . Clarithromycin Other (See Comments)    Upsets stomach  . Nsaids     GI BLEED  . Ranitidine     severe diarrhea  . Venlafaxine     severe depression    Review of Systems  Constitutional: Negative for fever and malaise/fatigue.  Respiratory: Negative for cough, shortness of breath and wheezing.   Cardiovascular: Negative for chest pain, palpitations, orthopnea, claudication and leg swelling.  Gastrointestinal: Negative.   Musculoskeletal: Positive for back pain and myalgias. Negative for falls, joint pain and neck pain.  Neurological: Negative.  Negative for weakness.  Endo/Heme/Allergies: Negative.   Psychiatric/Behavioral: Negative.     Objective:  BP 120/80 (BP Location: Right Arm, Patient Position: Sitting, Cuff Size: Normal)    Pulse 68   Temp (!) 97.5 F (36.4 C) (Oral)   Resp 16   Wt 186 lb (84.4 kg)   BMI 29.35 kg/m   Physical Exam  Constitutional: She is oriented to person, place, and time and well-developed, well-nourished, and in no distress.  HENT:  Head: Normocephalic and atraumatic.  Right Ear: External ear normal.  Left Ear: External ear normal.  Nose: Nose normal.  Eyes: Conjunctivae are normal. No scleral icterus.  Neck: No thyromegaly present.  Cardiovascular: Normal rate, regular rhythm and normal heart sounds.  Pulmonary/Chest: Effort normal and breath sounds normal.  Abdominal: Soft.  Neurological: She is alert and oriented to person, place, and time. Gait normal. GCS score is 15.  Skin: Skin is warm and dry.  Psychiatric: Mood, memory, affect and judgment normal.  Assessment and Plan :  1. Closed nondisplaced zone I fracture of sacrum, sequela  - DG Bone Density; Future - alendronate (FOSAMAX) 70 MG tablet; Take 1 tablet (70 mg total) by mouth every 7 (seven) days. Take with a full glass of water on an empty stomach.  Dispense: 4 tablet; Refill: 11  2. Osteoporosis, unspecified osteoporosis type, unspecified pathological fracture presence  - DG Bone Density; Future - alendronate (FOSAMAX) 70 MG tablet; Take 1 tablet (70 mg total) by mouth every 7 (seven) days. Take with a full glass of water on an empty stomach.  Dispense: 4 tablet; Refill: 11  I have done the exam and reviewed the chart and it is accurate to the best of my knowledge. Development worker, community has been used and  any errors in dictation or transcription are unintentional. Miguel Aschoff M.D. [facility]

## 2018-01-17 DIAGNOSIS — F5105 Insomnia due to other mental disorder: Secondary | ICD-10-CM | POA: Diagnosis not present

## 2018-01-17 DIAGNOSIS — F331 Major depressive disorder, recurrent, moderate: Secondary | ICD-10-CM | POA: Diagnosis not present

## 2018-01-18 ENCOUNTER — Encounter: Payer: Self-pay | Admitting: Physician Assistant

## 2018-01-18 ENCOUNTER — Ambulatory Visit (INDEPENDENT_AMBULATORY_CARE_PROVIDER_SITE_OTHER): Payer: Medicare Other | Admitting: Physician Assistant

## 2018-01-18 ENCOUNTER — Ambulatory Visit (INDEPENDENT_AMBULATORY_CARE_PROVIDER_SITE_OTHER): Payer: Medicare Other

## 2018-01-18 VITALS — BP 124/74 | HR 72 | Temp 98.0°F | Ht 67.0 in | Wt 187.2 lb

## 2018-01-18 DIAGNOSIS — Z0000 Encounter for general adult medical examination without abnormal findings: Secondary | ICD-10-CM | POA: Diagnosis not present

## 2018-01-18 DIAGNOSIS — N390 Urinary tract infection, site not specified: Secondary | ICD-10-CM

## 2018-01-18 LAB — POCT URINALYSIS DIPSTICK
Bilirubin, UA: NEGATIVE
Blood, UA: NEGATIVE
Glucose, UA: NEGATIVE
Ketones, UA: NEGATIVE
Nitrite, UA: NEGATIVE
Protein, UA: NEGATIVE
Spec Grav, UA: 1.025 (ref 1.010–1.025)
Urobilinogen, UA: 0.2 E.U./dL
pH, UA: 6 (ref 5.0–8.0)

## 2018-01-18 MED ORDER — SULFAMETHOXAZOLE-TRIMETHOPRIM 800-160 MG PO TABS: 1.0000 | ORAL_TABLET | Freq: Two times a day (BID) | ORAL | 0 refills | Status: DC

## 2018-01-18 NOTE — Progress Notes (Signed)
[name] Female    DOB: 1946-10-31   72 y.o.   MRN: [id number] Visit Date: 01/18/2018  Today's Provider: Mar Daring, PA-C   Chief Complaint  Patient presents with  . Urinary Tract Infection   Subjective:    Urinary Tract Infection   This is a new problem. The current episode started yesterday (Last night at bedtime). The problem has been unchanged. There has been no fever. Associated symptoms include frequency and urgency. Pertinent negatives include no flank pain, hematuria or nausea. Associated symptoms comments: Per patient she has been having diarrhea and reports that on Sunday she was not able to make to the bathroom.Report that she wears pads and when she was able to clean herself she had it all over and feels this is coming from that.. She has tried increased fluids (AZO) for the symptoms. The treatment provided mild relief. Her past medical history is significant for kidney stones and recurrent UTIs.       Allergies  Allergen Reactions  . Anti-Inflammatory Enzyme [Nutritional Supplements] Other (See Comments)    REACTION: Due to bleeding ulcer  . Cymbalta [Duloxetine Hcl] Other (See Comments)    REACTION: Urinary retention  . Clarithromycin Other (See Comments)    Upsets stomach  . Nsaids     GI BLEED  . Ranitidine     severe diarrhea  . Venlafaxine     severe depression     Current Outpatient Medications:  .  alendronate (FOSAMAX) 70 MG tablet, Take 1 tablet (70 mg total) by mouth every 7 (seven) days. Take with a full glass of water on an empty stomach., Disp: 4 tablet, Rfl: 11 .  AZO-CRANBERRY PO, Take by mouth as needed., Disp: , Rfl:  .  BIOTIN PO, Take 5,000 mcg by mouth daily. , Disp: , Rfl:  .  BREO ELLIPTA 200-25 MCG/INH AEPB, INHALE 1 PUFF EVERY DAY, Disp: 180 each, Rfl: 0 .  busPIRone (BUSPAR) 10 MG tablet, Take 10 mg by mouth 2 (two) times daily. , Disp: , Rfl:  .  Calcium Carbonate-Vitamin D (CALCIUM 600+D) 600-200  MG-UNIT TABS, Take 1 tablet by mouth daily., Disp: , Rfl:  .  citalopram (CELEXA) 40 MG tablet, 40 mg daily. , Disp: , Rfl:  .  donepezil (ARICEPT) 5 MG tablet, Take 5 mg by mouth at bedtime., Disp: , Rfl:  .  fish oil-omega-3 fatty acids 1000 MG capsule, Take 1 g by mouth daily.  , Disp: , Rfl:  .  fluticasone (FLONASE) 50 MCG/ACT nasal spray, Place 2 sprays into both nostrils. , Disp: , Rfl:  .  metoprolol tartrate (LOPRESSOR) 25 MG tablet, Take 1 tablet (25 mg total) by mouth 2 (two) times daily., Disp: 180 tablet, Rfl: 3 .  primidone (MYSOLINE) 50 MG tablet, Take 50 mg by mouth at bedtime. , Disp: , Rfl:  .  rosuvastatin (CRESTOR) 10 MG tablet, Take 1 tablet (10 mg total) by mouth daily. (Patient taking differently: Take 10 mg by mouth daily. ), Disp: 90 tablet, Rfl: 3 .  traZODone (DESYREL) 100 MG tablet, Take 250 mg by mouth at bedtime. , Disp: , Rfl:  .  vitamin B-12 (CYANOCOBALAMIN) 1000 MCG tablet, Take 1,000 mcg by mouth daily., Disp: , Rfl:   Review of Systems  Constitutional: Negative.   Respiratory: Negative.   Cardiovascular: Negative.   Gastrointestinal: Positive for abdominal pain and diarrhea. Negative for nausea.  Genitourinary: Positive for dysuria, frequency and urgency. Negative  for flank pain and hematuria.    Social History   Tobacco Use  . Smoking status: Never Smoker  . Smokeless tobacco: Never Used  Substance Use Topics  . Alcohol use: Yes    Alcohol/week: 0.0 oz    Comment: drinks 3-4 times a year   Objective:  Vitals: BP 124/74 (BP Location: Left Arm)   Pulse 72   Temp 98 F (36.7 C) (Oral)   Ht 5\' 7"  (1.702 m)   Wt 187 lb 3.2 oz (84.9 kg)   BMI 29.32 kg/m   Body mass index is 29.32 kg/m.  Physical Exam  Constitutional: She is oriented to person, place, and time. She appears well-developed and well-nourished. No distress.  Cardiovascular: Normal rate, regular rhythm and normal heart sounds. Exam reveals no gallop and no friction rub.  No murmur  heard. Pulmonary/Chest: Effort normal and breath sounds normal. No respiratory distress. She has no wheezes. She has no rales.  Abdominal: Soft. Normal appearance and bowel sounds are normal. She exhibits no distension and no mass. There is no hepatosplenomegaly. There is tenderness in the suprapubic area. There is no rebound, no guarding and no CVA tenderness.  Neurological: She is alert and oriented to person, place, and time.  Skin: Skin is warm and dry. She is not diaphoretic.  Vitals reviewed.      Assessment & Plan:     1. Urinary tract infection without hematuria, site unspecified Worsening symptoms. UA positive. Will treat empirically with Bactrim as below. Pyridium given for spasm. Continue to push fluids. Urine sent for culture. Will follow up pending C&S results. She is to call if symptoms do not improve or if they worsen.  - POCT urinalysis dipstick - Urine Culture - sulfamethoxazole-trimethoprim (BACTRIM DS,SEPTRA DS) 800-160 MG tablet; Take 1 tablet by mouth 2 (two) times daily.  Dispense: 20 tablet; Refill: 0       Mar Daring, PA-C  [facility]

## 2018-01-18 NOTE — Patient Instructions (Signed)
Nancy Blackburn , Thank you for taking time to come for your Medicare Wellness Visit. I appreciate your ongoing commitment to your health goals. Please review the following plan we discussed and let me know if I can assist you in the future.   Screening recommendations/referrals: Colonoscopy: Currently due, pt to set this up with Dr Earlean Shawl. Mammogram: Up to date Bone Density: Up to date Recommended yearly ophthalmology/optometry visit for glaucoma screening and checkup Recommended yearly dental visit for hygiene and checkup  Vaccinations: Influenza vaccine: Up to date Pneumococcal vaccine: Up to date Tdap vaccine: Up to date Shingles vaccine: Pt declines today.     Advanced directives: Already on file.   Conditions/risks identified: Recommend increasing water intake to 4-6 glasses a day.   Next appointment: 02/21/18 @ 9:00 AM   Preventive Care 65 Years and Older, Female Preventive care refers to lifestyle choices and visits with your health care provider that can promote health and wellness. What does preventive care include?  A yearly physical exam. This is also called an annual well check.  Dental exams once or twice a year.  Routine eye exams. Ask your health care provider how often you should have your eyes checked.  Personal lifestyle choices, including:  Daily care of your teeth and gums.  Regular physical activity.  Eating a healthy diet.  Avoiding tobacco and drug use.  Limiting alcohol use.  Practicing safe sex.  Taking low-dose aspirin every day.  Taking vitamin and mineral supplements as recommended by your health care provider. What happens during an annual well check? The services and screenings done by your health care provider during your annual well check will depend on your age, overall health, lifestyle risk factors, and family history of disease. Counseling  Your health care provider may ask you questions about your:  Alcohol use.  Tobacco  use.  Drug use.  Emotional well-being.  Home and relationship well-being.  Sexual activity.  Eating habits.  History of falls.  Memory and ability to understand (cognition).  Work and work Statistician.  Reproductive health. Screening  You may have the following tests or measurements:  Height, weight, and BMI.  Blood pressure.  Lipid and cholesterol levels. These may be checked every 5 years, or more frequently if you are over 56 years old.  Skin check.  Lung cancer screening. You may have this screening every year starting at age 3 if you have a 30-pack-year history of smoking and currently smoke or have quit within the past 15 years.  Fecal occult blood test (FOBT) of the stool. You may have this test every year starting at age 66.  Flexible sigmoidoscopy or colonoscopy. You may have a sigmoidoscopy every 5 years or a colonoscopy every 10 years starting at age 47.  Hepatitis C blood test.  Hepatitis B blood test.  Sexually transmitted disease (STD) testing.  Diabetes screening. This is done by checking your blood sugar (glucose) after you have not eaten for a while (fasting). You may have this done every 1-3 years.  Bone density scan. This is done to screen for osteoporosis. You may have this done starting at age 84.  Mammogram. This may be done every 1-2 years. Talk to your health care provider about how often you should have regular mammograms. Talk with your health care provider about your test results, treatment options, and if necessary, the need for more tests. Vaccines  Your health care provider may recommend certain vaccines, such as:  Influenza vaccine. This is recommended  every year.  Tetanus, diphtheria, and acellular pertussis (Tdap, Td) vaccine. You may need a Td booster every 10 years.  Zoster vaccine. You may need this after age 60.  Pneumococcal 13-valent conjugate (PCV13) vaccine. One dose is recommended after age 88.  Pneumococcal  polysaccharide (PPSV23) vaccine. One dose is recommended after age 9. Talk to your health care provider about which screenings and vaccines you need and how often you need them. This information is not intended to replace advice given to you by your health care provider. Make sure you discuss any questions you have with your health care provider. Document Released: 12/25/2015 Document Revised: 08/17/2016 Document Reviewed: 09/29/2015 Elsevier Interactive Patient Education  2017 [place] Prevention in the Home Falls can cause injuries. They can happen to people of all ages. There are many things you can do to make your home safe and to help prevent falls. What can I do on the outside of my home?  Regularly fix the edges of walkways and driveways and fix any cracks.  Remove anything that might make you trip as you walk through a door, such as a raised step or threshold.  Trim any bushes or trees on the path to your home.  Use bright outdoor lighting.  Clear any walking paths of anything that might make someone trip, such as rocks or tools.  Regularly check to see if handrails are loose or broken. Make sure that both sides of any steps have handrails.  Any raised decks and porches should have guardrails on the edges.  Have any leaves, snow, or ice cleared regularly.  Use sand or salt on walking paths during winter.  Clean up any spills in your garage right away. This includes oil or grease spills. What can I do in the bathroom?  Use night lights.  Install grab bars by the toilet and in the tub and shower. Do not use towel bars as grab bars.  Use non-skid mats or decals in the tub or shower.  If you need to sit down in the shower, use a plastic, non-slip stool.  Keep the floor dry. Clean up any water that spills on the floor as soon as it happens.  Remove soap buildup in the tub or shower regularly.  Attach bath mats securely with double-sided non-slip rug  tape.  Do not have throw rugs and other things on the floor that can make you trip. What can I do in the bedroom?  Use night lights.  Make sure that you have a light by your bed that is easy to reach.  Do not use any sheets or blankets that are too big for your bed. They should not hang down onto the floor.  Have a firm chair that has side arms. You can use this for support while you get dressed.  Do not have throw rugs and other things on the floor that can make you trip. What can I do in the kitchen?  Clean up any spills right away.  Avoid walking on wet floors.  Keep items that you use a lot in easy-to-reach places.  If you need to reach something above you, use a strong step stool that has a grab bar.  Keep electrical cords out of the way.  Do not use floor polish or wax that makes floors slippery. If you must use wax, use non-skid floor wax.  Do not have throw rugs and other things on the floor that can make you trip. What  can I do with my stairs?  Do not leave any items on the stairs.  Make sure that there are handrails on both sides of the stairs and use them. Fix handrails that are broken or loose. Make sure that handrails are as long as the stairways.  Check any carpeting to make sure that it is firmly attached to the stairs. Fix any carpet that is loose or worn.  Avoid having throw rugs at the top or bottom of the stairs. If you do have throw rugs, attach them to the floor with carpet tape.  Make sure that you have a light switch at the top of the stairs and the bottom of the stairs. If you do not have them, ask someone to add them for you. What else can I do to help prevent falls?  Wear shoes that:  Do not have high heels.  Have rubber bottoms.  Are comfortable and fit you well.  Are closed at the toe. Do not wear sandals.  If you use a stepladder:  Make sure that it is fully opened. Do not climb a closed stepladder.  Make sure that both sides of the  stepladder are locked into place.  Ask someone to hold it for you, if possible.  Clearly mark and make sure that you can see:  Any grab bars or handrails.  First and last steps.  Where the edge of each step is.  Use tools that help you move around (mobility aids) if they are needed. These include:  Canes.  Walkers.  Scooters.  Crutches.  Turn on the lights when you go into a dark area. Replace any light bulbs as soon as they burn out.  Set up your furniture so you have a clear path. Avoid moving your furniture around.  If any of your floors are uneven, fix them.  If there are any pets around you, be aware of where they are.  Review your medicines with your doctor. Some medicines can make you feel dizzy. This can increase your chance of falling. Ask your doctor what other things that you can do to help prevent falls. This information is not intended to replace advice given to you by your health care provider. Make sure you discuss any questions you have with your health care provider. Document Released: 09/24/2009 Document Revised: 05/05/2016 Document Reviewed: 01/02/2015 Elsevier Interactive Patient Education  2017 [employer].

## 2018-01-18 NOTE — Progress Notes (Signed)
Subjective:   Nancy Blackburn is a 72 y.o. female who presents for Medicare Annual (Subsequent) preventive examination.  Review of Systems:  N/A  Cardiac Risk Factors include: advanced age (>58men, >63 women);dyslipidemia;hypertension     Objective:     Vitals: BP 124/74 (BP Location: Left Arm)   Pulse 72   Temp 98 F (36.7 C) (Oral)   Ht 5\' 7"  (1.702 m)   Wt 187 lb 3.2 oz (84.9 kg)   BMI 29.32 kg/m   Body mass index is 29.32 kg/m.  Advanced Directives 01/18/2018 03/13/2017 01/04/2017 02/01/2016 11/24/2011 11/11/2011  Does Patient Have a Medical Advance Directive? Yes Yes Yes Yes Patient has advance directive, copy in chart Patient has advance directive, copy in chart  Type of Advance Directive [place];Living will Healthcare Power of [place];Living will [place];Living will Living will;Healthcare Power of Attorney Living will;Healthcare Power of [place] in Chart? Yes - Yes - - -  Pre-existing out of facility DNR order (yellow form or pink MOST form) - - - - No No    Tobacco Social History   Tobacco Use  Smoking Status Never Smoker  Smokeless Tobacco Never Used     Counseling given: Not Answered   Clinical Intake:  Pre-visit preparation completed: Yes  Pain : 0-10 Pain Score: 4  Pain Type: Acute pain Pain Location: Bladder(and lower back pain) Pain Descriptors / Indicators: Aching Pain Onset: Yesterday Pain Frequency: Constant     Nutritional Status: BMI 25 -29 Overweight Nutritional Risks: Nausea/ vomitting/ diarrhea(diarrhea recently- possibly due to not taking probiotic currently. ) Diabetes: No  How often do you need to have someone help you when you read instructions, pamphlets, or other written materials from your doctor or pharmacy?: 1 - Never  Interpreter Needed?: No  Information entered by :: [facility], LPN  Past Medical History:    Diagnosis Date  . Chronic sinus infection   . Ejection fraction    a. EF 55-65%, echo, mild LVH, June, 2013, small pericardial effusion  . Fracture, sacrum/coccyx ([place])   . Hyperlipidemia    a. takes Simvastatin  . Hypertension    a. takes Metoprolol, Lisinopril, and amlodipine.  . Low back pain   . Mild dementia   . Nasal polyps   . Normal nuclear stress test    a. 07/2005 Nl nuc stress test.  . Osteopenia   . Osteoporosis   . Palpitations    a. June 2013:  Event recorder normal sinus rhythm with rare PVCs - ? symptomatic PVC's.  . Parkinson disease ([place])   . Pericardial effusion    a. Small, echo, June, 2013  . PONV (postoperative nausea and vomiting)    Past Surgical History:  Procedure Laterality Date  . BACK SURGERY  08/2010  . BLADDER SUSPENSION  2003  . bone spur  2009   removed from right collar bone area and partial collar bone removed  . THYROIDECTOMY, PARTIAL    . TONSILLECTOMY     as child  . TUBAL LIGATION     29yrs ago  . VAGINAL HYSTERECTOMY     49yrs ago d/t cervical cancer   Family History  Problem Relation Age of Onset  . Colon cancer Mother   . Heart murmur Mother   . Heart disease Mother   . Angina Mother   . Heart disease Father   . COPD Father   . Atrial fibrillation  Brother   . Cushing syndrome Daughter   . Cancer Daughter        thyroid  . Breast cancer Cousin   . Hypotension Neg Hx   . Malignant hyperthermia Neg Hx   . Pseudochol deficiency Neg Hx    Social History   Socioeconomic History  . Marital status: Divorced    Spouse name: None  . Number of children: 2  . Years of education: None  . Highest education level: 12th grade  Social Needs  . Financial resource strain: Not hard at all  . Food insecurity - worry: Never true  . Food insecurity - inability: Never true  . Transportation needs - medical: No  . Transportation needs - non-medical: No  Occupational History  . Occupation: retired  Tobacco Use  . Smoking status:  Never Smoker  . Smokeless tobacco: Never Used  Substance and Sexual Activity  . Alcohol use: Yes    Alcohol/week: 0.0 oz    Comment: drinks 3-4 times a year  . Drug use: No  . Sexual activity: No  Other Topics Concern  . None  Social History Narrative  . None    Outpatient Encounter Medications as of 01/18/2018  Medication Sig  . alendronate (FOSAMAX) 70 MG tablet Take 1 tablet (70 mg total) by mouth every 7 (seven) days. Take with a full glass of water on an empty stomach.  . AZO-CRANBERRY PO Take by mouth as needed.  Marland Kitchen BIOTIN PO Take 5,000 mcg by mouth daily.   Marland Kitchen BREO ELLIPTA 200-25 MCG/INH AEPB INHALE 1 PUFF EVERY DAY  . busPIRone (BUSPAR) 10 MG tablet Take 10 mg by mouth 2 (two) times daily.   . Calcium Carbonate-Vitamin D (CALCIUM 600+D) 600-200 MG-UNIT TABS Take 1 tablet by mouth daily.  . citalopram (CELEXA) 40 MG tablet 40 mg daily.   Marland Kitchen donepezil (ARICEPT) 5 MG tablet Take 5 mg by mouth at bedtime.  . fish oil-omega-3 fatty acids 1000 MG capsule Take 1 g by mouth daily.    . fluticasone (FLONASE) 50 MCG/ACT nasal spray Place 2 sprays into both nostrils.   . metoprolol tartrate (LOPRESSOR) 25 MG tablet Take 1 tablet (25 mg total) by mouth 2 (two) times daily.  . primidone (MYSOLINE) 50 MG tablet Take 50 mg by mouth at bedtime.   . rosuvastatin (CRESTOR) 10 MG tablet Take 1 tablet (10 mg total) by mouth daily. (Patient taking differently: Take 10 mg by mouth daily. )  . traZODone (DESYREL) 100 MG tablet Take 250 mg by mouth at bedtime.   . vitamin B-12 (CYANOCOBALAMIN) 1000 MCG tablet Take 1,000 mcg by mouth daily.   No facility-administered encounter medications on file as of 01/18/2018.     Activities of Daily Living In your present state of health, do you have any difficulty performing the following activities: 01/18/2018  Hearing? Y  Comment Pt has not seen an audiologist for this and is not intersted in doing so currently.   Vision? N  Difficulty concentrating or making  decisions? Y  Walking or climbing stairs? Y  Comment Due to back pain.  Dressing or bathing? N  Doing errands, shopping? N  Preparing Food and eating ? N  Using the Toilet? N  In the past six months, have you accidently leaked urine? Y  Comment Due to previous bladder sx. Wears protection at all times.   Do you have problems with loss of bowel control? Y  Comment Due to current diarrhea.  Managing your Medications?  N  Managing your Finances? N  Housekeeping or managing your Housekeeping? N  Some recent data might be hidden    Patient Care Team: Jerrol Banana., MD as PCP - General (Unknown Physician Specialty) Marlyn Corporal, Clearnce Sorrel, PA-C as Physician Assistant (Family Medicine) Birder Robson, MD as Referring Physician (Ophthalmology) Vladimir Crofts, MD as Consulting Physician (Neurology) Wilhelmina Mcardle, MD as Consulting Physician (Pulmonary Disease) Bjorn Loser, MD as Consulting Physician (Urology) Newman Pies, MD as Consulting Physician (Neurosurgery) Chauncey Mann, MD as Referring Physician (Psychiatry)    Assessment:   This is a routine wellness examination for [place].  Exercise Activities and Dietary recommendations Current Exercise Habits: The patient does not participate in regular exercise at present, Exercise limited by: orthopedic condition(s)  Goals    . DIET - INCREASE WATER INTAKE     Recommend increasing water intake to 4-6 glasses a day.        Fall Risk Fall Risk  01/18/2018 01/04/2017 08/31/2015  Falls in the past year? No No No   Is the patient's home free of loose throw rugs in walkways, pet beds, electrical cords, etc?   yes      Grab bars in the bathroom? yes      Handrails on the stairs?   yes      Adequate lighting?   yes  Timed Get Up and Go performed: N/A  Depression Screen PHQ 2/9 Scores 01/18/2018 08/16/2017 01/04/2017 08/31/2015  PHQ - 2 Score 3 0 0 0  PHQ- 9 Score 7 3 - -     Cognitive Function: Pt declines screening  today due to being tested recently by neuro.   MMSE - Mini Mental State Exam 08/01/2016  Orientation to time 5  Orientation to Place 5  Registration 3  Attention/ Calculation 5  Recall 2  Language- name 2 objects 2  Language- repeat 1  Language- follow 3 step command 3  Language- read & follow direction 1  Write a sentence 1  Copy design 1  Total score 29     6CIT Screen 01/04/2017  What Year? 0 points  What month? 0 points  What time? 3 points  Count back from 20 0 points  Months in reverse 0 points  Repeat phrase 0 points  Total Score 3    Immunization History  Administered Date(s) Administered  . Influenza Inj Mdck Quad Pf 10/27/2016  . Influenza Split 10/05/2011, 10/10/2012  . Influenza, High Dose Seasonal PF 09/04/2014, 09/01/2015, 10/05/2017  . Influenza-Unspecified 10/12/2013, 08/12/2014, 09/04/2014  . Pneumococcal Conjugate-13 03/26/2015  . Pneumococcal Polysaccharide-23 10/11/1999, 04/10/2012  . Td 08/23/1999  . Zoster 09/26/2008    Qualifies for Shingles Vaccine? Due for Shingles vaccine. Declined my offer to administer today. Education has been provided regarding the importance of this vaccine. Pt has been advised to call her insurance company to determine her out of pocket expense. Advised she may also receive this vaccine at her local pharmacy or Health Dept. Verbalized acceptance and understanding.  Screening Tests Health Maintenance  Topic Date Due  . COLONOSCOPY  01/03/2018  . MAMMOGRAM  04/13/2019  . TETANUS/TDAP  10/24/2022  . INFLUENZA VACCINE  Completed  . DEXA SCAN  Completed  . Hepatitis C Screening  Completed  . PNA vac Low Risk Adult  Completed    Cancer Screenings: Lung: Low Dose CT Chest recommended if Age 39-80 years, 30 pack-year currently smoking OR have quit w/in 15years. Patient does not qualify. Breast:  Up to date  on Mammogram? Yes   Up to date of Bone Density/Dexa? Yes Colorectal: Currently due, pt to call and set this up. Dr  Oswald Hillock office has already contacted pt regarding this.   Additional Screenings:  Hepatitis B/HIV/Syphillis: Pt declines today.  Hepatitis C Screening: Up to date     Plan:  I have personally reviewed and addressed the Medicare Annual Wellness questionnaire and have noted the following in the patient's chart:  A. Medical and social history B. Use of alcohol, tobacco or illicit drugs  C. Current medications and supplements D. Functional ability and status E.  Nutritional status F.  Physical activity G. Advance directives H. List of other physicians I.  Hospitalizations, surgeries, and ER visits in previous 12 months J.  [place] such as hearing and vision if needed, cognitive and depression L. Referrals and appointments - none  In addition, I have reviewed and discussed with patient certain preventive protocols, quality metrics, and best practice recommendations. A written personalized care plan for preventive services as well as general preventive health recommendations were provided to patient.  See attached scanned questionnaire for additional information.   Signed,  Fabio Neighbors, LPN Nurse Health Advisor   Nurse Recommendations: Pt is due for a colonoscopy. Per pt- She was contacted by Dr [facility] office and she will be calling to set this up this year.

## 2018-01-20 LAB — URINE CULTURE

## 2018-01-22 ENCOUNTER — Telehealth: Payer: Self-pay

## 2018-01-22 NOTE — Telephone Encounter (Signed)
-----   Message from Mar Daring, PA-C sent at 01/22/2018  8:54 AM EST ----- Urine culture was negative for bacterial growth. Are symptoms improving? If so continue antibiotic until completed.

## 2018-01-22 NOTE — Telephone Encounter (Signed)
Patient advised as below. Patient verbalizes understanding and is in agreement with treatment plan.  

## 2018-02-02 DIAGNOSIS — M8448XD Pathological fracture, other site, subsequent encounter for fracture with routine healing: Secondary | ICD-10-CM | POA: Diagnosis not present

## 2018-02-07 ENCOUNTER — Ambulatory Visit
Admission: RE | Admit: 2018-02-07 | Discharge: 2018-02-07 | Disposition: A | Discharge status: Home / Self Care | Payer: Medicare Other | Source: Ambulatory Visit | Attending: Family Medicine | Admitting: Family Medicine

## 2018-02-07 DIAGNOSIS — Z780 Asymptomatic menopausal state: Secondary | ICD-10-CM | POA: Diagnosis not present

## 2018-02-07 DIAGNOSIS — M8580 Other specified disorders of bone density and structure, unspecified site: Secondary | ICD-10-CM | POA: Diagnosis not present

## 2018-02-07 DIAGNOSIS — Z13820 Encounter for screening for osteoporosis: Secondary | ICD-10-CM | POA: Insufficient documentation

## 2018-02-07 DIAGNOSIS — X58XXXS Exposure to other specified factors, sequela: Secondary | ICD-10-CM | POA: Insufficient documentation

## 2018-02-07 DIAGNOSIS — S32011S Stable burst fracture of first lumbar vertebra, sequela: Secondary | ICD-10-CM | POA: Diagnosis not present

## 2018-02-07 DIAGNOSIS — S32110S Nondisplaced Zone I fracture of sacrum, sequela: Secondary | ICD-10-CM

## 2018-02-07 DIAGNOSIS — M85852 Other specified disorders of bone density and structure, left thigh: Secondary | ICD-10-CM | POA: Diagnosis not present

## 2018-02-07 DIAGNOSIS — M810 Age-related osteoporosis without current pathological fracture: Secondary | ICD-10-CM

## 2018-02-12 DIAGNOSIS — M170 Bilateral primary osteoarthritis of knee: Secondary | ICD-10-CM | POA: Diagnosis not present

## 2018-02-21 ENCOUNTER — Ambulatory Visit (INDEPENDENT_AMBULATORY_CARE_PROVIDER_SITE_OTHER): Payer: Medicare Other | Admitting: Family Medicine

## 2018-02-21 ENCOUNTER — Other Ambulatory Visit: Payer: Self-pay

## 2018-02-21 VITALS — BP 142/72 | HR 92 | Temp 97.8°F | Resp 16

## 2018-02-21 DIAGNOSIS — D509 Iron deficiency anemia, unspecified: Secondary | ICD-10-CM | POA: Diagnosis not present

## 2018-02-21 DIAGNOSIS — E7800 Pure hypercholesterolemia, unspecified: Secondary | ICD-10-CM | POA: Diagnosis not present

## 2018-02-21 DIAGNOSIS — E538 Deficiency of other specified B group vitamins: Secondary | ICD-10-CM | POA: Diagnosis not present

## 2018-02-21 DIAGNOSIS — F411 Generalized anxiety disorder: Secondary | ICD-10-CM

## 2018-02-21 DIAGNOSIS — F3341 Major depressive disorder, recurrent, in partial remission: Secondary | ICD-10-CM | POA: Diagnosis not present

## 2018-02-21 DIAGNOSIS — E559 Vitamin D deficiency, unspecified: Secondary | ICD-10-CM | POA: Diagnosis not present

## 2018-02-21 DIAGNOSIS — K279 Peptic ulcer, site unspecified, unspecified as acute or chronic, without hemorrhage or perforation: Secondary | ICD-10-CM | POA: Diagnosis not present

## 2018-02-21 DIAGNOSIS — I10 Essential (primary) hypertension: Secondary | ICD-10-CM

## 2018-02-21 NOTE — Progress Notes (Signed)
Nancy Blackburn  MRN: [id number] DOB: 1946-09-11  Subjective:  HPI  The patient is a 72 year old female who presents for follow up after her Medicare wellness visit.  Immunization History  Administered Date(s) Administered  . Influenza Inj Mdck Quad Pf 10/27/2016  . Influenza Split 10/05/2011, 10/10/2012  . Influenza, High Dose Seasonal PF 09/04/2014, 09/01/2015, 10/05/2017  . Influenza-Unspecified 10/12/2013, 08/12/2014, 09/04/2014  . Pneumococcal Conjugate-13 03/26/2015  . Pneumococcal Polysaccharide-23 10/11/1999, 04/10/2012  . Td 08/23/1999  . Zoster 09/26/2008    01/03/13 Colonoscopy, [place]-Normal, FH of coloncancer, repeat 5 years. 02/07/18 BMD-Osteopenia-with recent fracture 04/12/17 Mammography-Negative Pap Smear-S/P hysterectomy secondary to cervical cancer.  Patient Active Problem List   Diagnosis Date Noted  . Cervical spondylosis 05/04/2017  . Cervical radiculopathy 05/04/2017  . Family history of Cushing disease 01/12/2017  . B12 deficiency 12/18/2016  . Mild cognitive impairment with memory loss 12/18/2016  . Parkinsonian features 12/18/2016  . Vitamin D deficiency 12/18/2016  . Chalazion, bilateral 06/08/2016  . Absolute anemia 07/30/2015  . Edema extremities 07/30/2015  . Benign essential tremor 07/30/2015  . Anxiety, generalized 07/30/2015  . Gastro-esophageal reflux disease without esophagitis 07/30/2015  . HLD (hyperlipidemia) 07/30/2015  . Cannot sleep 07/30/2015  . Lumbar radiculopathy 07/30/2015  . Depression, major, recurrent, in partial remission ([place]) 07/30/2015  . Arthritis, degenerative 07/30/2015  . Allergic rhinitis 07/30/2015  . Gastroduodenal ulcer 07/30/2015  . Calcium blood increased 07/30/2015  . Pre-syncope 05/17/2015  . Dyspnea 03/31/2015  . Essential hypertension 03/31/2015  . Bleeding stomach ulcer 10/14/2013  . Anemia, iron deficiency 10/14/2013  . Weakness generalized 10/14/2013  . Ejection fraction   . Pericardial  effusion   . Palpitations   . Low back pain   . Normal nuclear stress test     Past Medical History:  Diagnosis Date  . Chronic sinus infection   . Ejection fraction    a. EF 55-65%, echo, mild LVH, June, 2013, small pericardial effusion  . Fracture, sacrum/coccyx ([place])   . Hyperlipidemia    a. takes Simvastatin  . Hypertension    a. takes Metoprolol, Lisinopril, and amlodipine.  . Low back pain   . Mild dementia   . Nasal polyps   . Normal nuclear stress test    a. 07/2005 Nl nuc stress test.  . Osteopenia   . Osteoporosis   . Palpitations    a. June 2013:  Event recorder normal sinus rhythm with rare PVCs - ? symptomatic PVC's.  . Parkinson disease ([place])   . Pericardial effusion    a. Small, echo, June, 2013  . PONV (postoperative nausea and vomiting)     Social History   Socioeconomic History  . Marital status: Divorced    Spouse name: Not on file  . Number of children: 2  . Years of education: Not on file  . Highest education level: 12th grade  Social Needs  . Financial resource strain: Not hard at all  . Food insecurity - worry: Never true  . Food insecurity - inability: Never true  . Transportation needs - medical: No  . Transportation needs - non-medical: No  Occupational History  . Occupation: retired  Tobacco Use  . Smoking status: Never Smoker  . Smokeless tobacco: Never Used  Substance and Sexual Activity  . Alcohol use: Yes    Alcohol/week: 0.0 oz    Comment: drinks 3-4 times a year  . Drug use: No  . Sexual activity: No  Other Topics Concern  . Not  on file  Social History Narrative  . Not on file    Outpatient Encounter Medications as of 02/21/2018  Medication Sig Note  . alendronate (FOSAMAX) 70 MG tablet Take 1 tablet (70 mg total) by mouth every 7 (seven) days. Take with a full glass of water on an empty stomach.   . AZO-CRANBERRY PO Take by mouth as needed.   Marland Kitchen BIOTIN PO Take 5,000 mcg by mouth daily.    Marland Kitchen BREO ELLIPTA 200-25 MCG/INH  AEPB INHALE 1 PUFF EVERY DAY   . busPIRone (BUSPAR) 10 MG tablet Take 10 mg by mouth 2 (two) times daily.    . Calcium Carbonate-Vitamin D (CALCIUM 600+D) 600-200 MG-UNIT TABS Take 1 tablet by mouth daily.   . citalopram (CELEXA) 40 MG tablet 40 mg daily.  08/31/2015: Received from: External Pharmacy  . donepezil (ARICEPT) 5 MG tablet Take 5 mg by mouth at bedtime.   . fish oil-omega-3 fatty acids 1000 MG capsule Take 1 g by mouth daily.     . fluticasone (FLONASE) 50 MCG/ACT nasal spray Place 2 sprays into both nostrils.    . metoprolol tartrate (LOPRESSOR) 25 MG tablet Take 1 tablet (25 mg total) by mouth 2 (two) times daily.   . primidone (MYSOLINE) 50 MG tablet Take 50 mg by mouth at bedtime.    . rosuvastatin (CRESTOR) 10 MG tablet Take 1 tablet (10 mg total) by mouth daily. (Patient taking differently: Take 10 mg by mouth daily. )   . sulfamethoxazole-trimethoprim (BACTRIM DS,SEPTRA DS) 800-160 MG tablet Take 1 tablet by mouth 2 (two) times daily.   . traZODone (DESYREL) 100 MG tablet Take 250 mg by mouth at bedtime.    . vitamin B-12 (CYANOCOBALAMIN) 1000 MCG tablet Take 1,000 mcg by mouth daily.    No facility-administered encounter medications on file as of 02/21/2018.     Allergies  Allergen Reactions  . Anti-Inflammatory Enzyme [Nutritional Supplements] Other (See Comments)    REACTION: Due to bleeding ulcer  . Cymbalta [Duloxetine Hcl] Other (See Comments)    REACTION: Urinary retention  . Clarithromycin Other (See Comments)    Upsets stomach  . Nsaids     GI BLEED  . Ranitidine     severe diarrhea  . Venlafaxine     severe depression    Review of Systems  Constitutional: Negative.   HENT: Negative.   Eyes: Negative.   Respiratory: Negative.   Cardiovascular: Negative.   Gastrointestinal: Positive for diarrhea.  Genitourinary: Negative.        Enuresis   Musculoskeletal: Positive for back pain and joint pain.  Skin: Negative.   Endo/Heme/Allergies: Negative.     Psychiatric/Behavioral: The patient has insomnia.     Objective:  BP (!) 142/72 (BP Location: Right Arm, Patient Position: Sitting, Cuff Size: Normal)   Pulse 92   Temp 97.8 F (36.6 C) (Oral)   Resp 16   Physical Exam  Constitutional: She is oriented to person, place, and time and well-developed, well-nourished, and in no distress.  HENT:  Head: Normocephalic and atraumatic.  Right Ear: External ear normal.  Left Ear: External ear normal.  Nose: Nose normal.  Mouth/Throat: Oropharynx is clear and moist.  Eyes: Conjunctivae and EOM are normal. Pupils are equal, round, and reactive to light.  Neck: Normal range of motion. Neck supple.  Cardiovascular: Normal rate, regular rhythm, normal heart sounds and intact distal pulses.  Pulmonary/Chest: Effort normal and breath sounds normal.  Abdominal: Soft. Bowel sounds are normal.  Musculoskeletal:  Normal range of motion.  Neurological: She is alert and oriented to person, place, and time. Gait normal. GCS score is 15.  Skin: Skin is warm and dry.  Psychiatric: Mood, memory, affect and judgment normal.    Assessment and Plan :  1. Essential hypertension   2. Pure hypercholesterolemia  - Lipid Panel With LDL/HDL Ratio  3. Iron deficiency anemia, unspecified iron deficiency anemia type CBC 4.FH colon cancer Colonoscopy in next month by Dr Earlean Shawl. 5.s/p hysterectomy for cervical cancer All paps normal for past 42 years. 6.OA 7.Osteopenia 8.B12 deficiency 9.MDD/GAD  I have done the exam and reviewed the chart and it is accurate to the best of my knowledge. Development worker, community has been used and  any errors in dictation or transcription are unintentional. Miguel Aschoff M.D. [facility]

## 2018-02-22 DIAGNOSIS — Z800 Family history of malignant neoplasm of digestive organs: Secondary | ICD-10-CM | POA: Diagnosis not present

## 2018-02-22 DIAGNOSIS — Z1211 Encounter for screening for malignant neoplasm of colon: Secondary | ICD-10-CM | POA: Diagnosis not present

## 2018-02-22 DIAGNOSIS — K5730 Diverticulosis of large intestine without perforation or abscess without bleeding: Secondary | ICD-10-CM | POA: Diagnosis not present

## 2018-02-22 LAB — LIPID PANEL WITH LDL/HDL RATIO
Cholesterol, Total: 178 mg/dL (ref 100–199)
HDL: 58 mg/dL (ref >39)
LDL Calculated: 86 mg/dL (ref 0–99)
LDl/HDL Ratio: 1.5 ratio (ref 0.0–3.2)
Triglycerides: 168 mg/dL — ABNORMAL HIGH (ref 0–149)
VLDL Cholesterol Cal: 34 mg/dL (ref 5–40)

## 2018-02-22 LAB — HM COLONOSCOPY

## 2018-02-23 ENCOUNTER — Telehealth: Payer: Self-pay

## 2018-02-23 NOTE — Telephone Encounter (Signed)
Patient advised.KW 

## 2018-02-23 NOTE — Telephone Encounter (Signed)
-----   Message from Jerrol Banana., MD sent at 02/23/2018  3:23 PM EDT ----- Lipids better.

## 2018-02-24 ENCOUNTER — Encounter: Payer: Self-pay | Admitting: Family Medicine

## 2018-03-14 ENCOUNTER — Encounter: Payer: Self-pay | Admitting: Family Medicine

## 2018-03-21 DIAGNOSIS — E538 Deficiency of other specified B group vitamins: Secondary | ICD-10-CM | POA: Diagnosis not present

## 2018-03-21 DIAGNOSIS — G250 Essential tremor: Secondary | ICD-10-CM | POA: Diagnosis not present

## 2018-03-21 DIAGNOSIS — G3184 Mild cognitive impairment, so stated: Secondary | ICD-10-CM | POA: Diagnosis not present

## 2018-03-21 DIAGNOSIS — Z8659 Personal history of other mental and behavioral disorders: Secondary | ICD-10-CM | POA: Diagnosis not present

## 2018-04-02 ENCOUNTER — Telehealth: Payer: Self-pay | Admitting: Family Medicine

## 2018-04-02 NOTE — Telephone Encounter (Signed)
Dr. Trena Platt nurse was suppose to send a note about changing her blood pressure medication to something else.  She is going to be needing a refill on what ever Dr. Rosanna Randy wants to use.  She uses [facility]  Her call back is [phone]

## 2018-04-05 NOTE — Telephone Encounter (Signed)
I do not see anything from Dr Manuella Ghazi. We can change Metoprolol to norvasc or diltiazem.

## 2018-04-10 ENCOUNTER — Ambulatory Visit (INDEPENDENT_AMBULATORY_CARE_PROVIDER_SITE_OTHER): Payer: Medicare Other | Admitting: Family Medicine

## 2018-04-10 ENCOUNTER — Encounter: Payer: Self-pay | Admitting: Family Medicine

## 2018-04-10 VITALS — BP 136/80 | HR 64 | Temp 98.3°F | Resp 16 | Wt 186.0 lb

## 2018-04-10 DIAGNOSIS — G250 Essential tremor: Secondary | ICD-10-CM | POA: Diagnosis not present

## 2018-04-10 DIAGNOSIS — F3341 Major depressive disorder, recurrent, in partial remission: Secondary | ICD-10-CM

## 2018-04-10 DIAGNOSIS — F411 Generalized anxiety disorder: Secondary | ICD-10-CM | POA: Diagnosis not present

## 2018-04-10 DIAGNOSIS — I10 Essential (primary) hypertension: Secondary | ICD-10-CM | POA: Diagnosis not present

## 2018-04-10 MED ORDER — PROPRANOLOL HCL 20 MG PO TABS: 20.0000 mg | ORAL_TABLET | Freq: Two times a day (BID) | ORAL | 11 refills | Status: DC

## 2018-04-10 MED ORDER — ROSUVASTATIN CALCIUM 10 MG PO TABS: 10.0000 mg | ORAL_TABLET | Freq: Every day | ORAL | 3 refills | Status: DC

## 2018-04-10 NOTE — Progress Notes (Signed)
[name] Female    DOB: 1946/01/09   72 y.o.   MRN: [id number] Visit Date: 04/10/2018  Today's Provider: Wilhemena Durie, MD   Chief Complaint  Patient presents with  . Hypertension  . Hyperlipidemia   Subjective:    HPI Pt is here today to have her BP and cholesterol medication refilled. She reports that she has been feeling well. No chest pain shortness of breath. She reports that Dr. Janyth Pupa PA was suppose to be sending a note to [place] about changing her BP to another medication from metoprolol.     Allergies  Allergen Reactions  . Anti-Inflammatory Enzyme [Nutritional Supplements] Other (See Comments)    REACTION: Due to bleeding ulcer  . Cymbalta [Duloxetine Hcl] Other (See Comments)    REACTION: Urinary retention  . Clarithromycin Other (See Comments)    Upsets stomach  . Nsaids     GI BLEED  . Ranitidine     severe diarrhea  . Venlafaxine     severe depression     Current Outpatient Medications:  .  alendronate (FOSAMAX) 70 MG tablet, Take 1 tablet (70 mg total) by mouth every 7 (seven) days. Take with a full glass of water on an empty stomach., Disp: 4 tablet, Rfl: 11 .  BIOTIN PO, Take 5,000 mcg by mouth daily. , Disp: , Rfl:  .  busPIRone (BUSPAR) 10 MG tablet, Take 10 mg by mouth 2 (two) times daily. , Disp: , Rfl:  .  Calcium Carbonate-Vitamin D (CALCIUM 600+D) 600-200 MG-UNIT TABS, Take 1 tablet by mouth daily., Disp: , Rfl:  .  citalopram (CELEXA) 40 MG tablet, 40 mg daily. , Disp: , Rfl:  .  donepezil (ARICEPT) 5 MG tablet, Take 5 mg by mouth at bedtime., Disp: , Rfl:  .  fish oil-omega-3 fatty acids 1000 MG capsule, Take 1 g by mouth daily.  , Disp: , Rfl:  .  fluticasone (FLONASE) 50 MCG/ACT nasal spray, Place 2 sprays into both nostrils. , Disp: , Rfl:  .  metoprolol tartrate (LOPRESSOR) 25 MG tablet, Take 1 tablet (25 mg total) by mouth 2 (two) times daily., Disp: 180 tablet, Rfl: 3 .  primidone (MYSOLINE) 50 MG tablet, Take  50 mg by mouth at bedtime. , Disp: , Rfl:  .  rosuvastatin (CRESTOR) 10 MG tablet, Take 1 tablet (10 mg total) by mouth daily. (Patient taking differently: Take 10 mg by mouth daily. ), Disp: 90 tablet, Rfl: 3 .  traZODone (DESYREL) 100 MG tablet, Take 250 mg by mouth at bedtime. , Disp: , Rfl:  .  vitamin B-12 (CYANOCOBALAMIN) 1000 MCG tablet, Take 1,000 mcg by mouth daily., Disp: , Rfl:  .  AZO-CRANBERRY PO, Take by mouth as needed., Disp: , Rfl:  .  BREO ELLIPTA 200-25 MCG/INH AEPB, INHALE 1 PUFF EVERY DAY (Patient not taking: Reported on 04/10/2018), Disp: 180 each, Rfl: 0 .  sulfamethoxazole-trimethoprim (BACTRIM DS,SEPTRA DS) 800-160 MG tablet, Take 1 tablet by mouth 2 (two) times daily. (Patient not taking: Reported on 04/10/2018), Disp: 20 tablet, Rfl: 0  Review of Systems  Constitutional: Negative.   HENT: Negative.   Eyes: Negative.   Respiratory: Negative.   Cardiovascular: Negative.   Gastrointestinal: Negative.   Endocrine: Negative.   Genitourinary: Negative.   Musculoskeletal: Negative.   Skin: Negative.   Allergic/Immunologic: Negative.   Neurological: Negative.   Hematological: Negative.   Psychiatric/Behavioral: Negative.     Social History   Tobacco Use  .  Smoking status: Never Smoker  . Smokeless tobacco: Never Used  Substance Use Topics  . Alcohol use: Yes    Alcohol/week: 0.0 oz    Comment: drinks 3-4 times a year   Objective:   BP 136/80 (BP Location: Right Arm, Patient Position: Sitting, Cuff Size: Normal)   Pulse 64   Temp 98.3 F (36.8 C) (Oral)   Resp 16   Wt 186 lb (84.4 kg)   BMI 29.13 kg/m  Vitals:   04/10/18 1504  BP: 136/80  Pulse: 64  Resp: 16  Temp: 98.3 F (36.8 C)  TempSrc: Oral  Weight: 186 lb (84.4 kg)     Physical Exam  Constitutional: She is oriented to person, place, and time. She appears well-developed and well-nourished.  HENT:  Head: Normocephalic and atraumatic.  Right Ear: External ear normal.  Left Ear: External  ear normal.  Nose: Nose normal.  Mouth/Throat: Oropharynx is clear and moist.  Eyes: No scleral icterus.  Neck: No thyromegaly present.  Cardiovascular: Normal rate, regular rhythm and normal heart sounds.  Pulmonary/Chest: Effort normal and breath sounds normal.  Abdominal: Soft.  Musculoskeletal: She exhibits no edema.  Lymphadenopathy:    She has no cervical adenopathy.  Neurological: She is alert and oriented to person, place, and time.  Skin: Skin is warm and dry.  Psychiatric: She has a normal mood and affect. Her behavior is normal. Judgment and thought content normal.        Assessment & Plan:   HTN Tremor Stop Toprol and try Inderal 20mg  BID. RTC 1 month. MDD/GAD per Dr Nicolasa Ducking.     I have done the exam and reviewed the above chart and it is accurate to the best of my knowledge. Development worker, community has been used in this note in any air is in the dictation or transcription are unintentional.  Wilhemena Durie, MD  [facility]

## 2018-04-17 DIAGNOSIS — S93402A Sprain of unspecified ligament of left ankle, initial encounter: Secondary | ICD-10-CM | POA: Diagnosis not present

## 2018-04-17 DIAGNOSIS — S92355A Nondisplaced fracture of fifth metatarsal bone, left foot, initial encounter for closed fracture: Secondary | ICD-10-CM | POA: Diagnosis not present

## 2018-04-18 ENCOUNTER — Ambulatory Visit: Payer: Medicare Other | Admitting: Pulmonary Disease

## 2018-04-26 DIAGNOSIS — S92355A Nondisplaced fracture of fifth metatarsal bone, left foot, initial encounter for closed fracture: Secondary | ICD-10-CM | POA: Diagnosis not present

## 2018-04-30 ENCOUNTER — Encounter: Payer: Self-pay | Admitting: Pulmonary Disease

## 2018-04-30 ENCOUNTER — Ambulatory Visit (INDEPENDENT_AMBULATORY_CARE_PROVIDER_SITE_OTHER): Payer: Medicare Other | Admitting: Pulmonary Disease

## 2018-04-30 VITALS — BP 118/78 | HR 59 | Ht 67.0 in | Wt 189.0 lb

## 2018-04-30 DIAGNOSIS — J4530 Mild persistent asthma, uncomplicated: Secondary | ICD-10-CM

## 2018-04-30 MED ORDER — ALBUTEROL SULFATE HFA 108 (90 BASE) MCG/ACT IN AERS: 1.0000 | INHALATION_SPRAY | Freq: Four times a day (QID) | RESPIRATORY_TRACT | 5 refills | Status: DC | PRN

## 2018-04-30 MED ORDER — FLUTICASONE-SALMETEROL 250-50 MCG/DOSE IN AEPB: 1.0000 | INHALATION_SPRAY | Freq: Two times a day (BID) | RESPIRATORY_TRACT | 3 refills | Status: DC

## 2018-04-30 NOTE — Patient Instructions (Signed)
Sample of Breo inhaler provided  Prescription sent for Advair, 1 inhalation twice a day  Prescription sent for albuterol rescue inhaler to be used as needed for breakthrough symptoms of shortness of breath, wheezing, chest tightness, cough  Follow-up in 3 to 4 months or sooner as needed

## 2018-05-02 NOTE — Progress Notes (Signed)
PULMONARY OFFICE F/U NOTE  Requesting MD/Service: Miguel Aschoff Date of initial consultation: 06/10/16 Reason for consultation: Dyspnea  PT PROFILE: 72 y.o. F referred for evaluation of unexplained exertional dyspnea.  Her dyspnea improved significantly with Breo inhaler.  Therefore, a diagnosis of asthma was made.  DATA: Echocardiogram 04/14/16: LVEF 40-98%, grade 2 diastolic dysfunction, LA mildly dilated Myoview 04/15/15: no evidence of ischemia PFTs 07/14/16: Normal spirometry, mild restriction, normal DLCO 6 MWT 07/14/16: 336 meters. Limited by arthritic pain in knees > dyspnea  INTERVAL: Last seen 07/2016.  No major respiratory or pulmonary problems in the interim  SUBJ: Until recently, she has remained on the Breo inhaler with good control of her respiratory symptoms.  However, over the past month she has been off of it due to its cost.  She now notes worsening exertional dyspnea.  She has no rescue inhaler available. Denies CP, fever, purulent sputum, hemoptysis, LE edema and calf tenderness.  Recent left ankle fracture due to fall  OBJ:  Vitals:   04/30/18 1453 04/30/18 1501  BP:  118/78  Pulse:  (!) 59  SpO2:  98%  Weight: 189 lb (85.7 kg)   Height: 5\' 7"  (1.702 m)   RA  EXAM:  Gen: NAD HEENT: NCAT, sclera white Neck: No JVD Lungs: breath sounds full, no wheezes or other adventitious sounds Cardiovascular: RRR, no murmurs Abdomen: Soft, nontender, normal BS Ext: without clubbing, cyanosis, edema.  Left foot and walking splint Neuro: grossly intact Skin: Limited exam, no lesions noted   DATA:   BMP Latest Ref Rng & Units 11/23/2017 01/09/2017 04/22/2016  Glucose 65 - 99 mg/dL 85 103(H) 104(H)  BUN 7 - 25 mg/dL 14 11 21   Creatinine 0.60 - 0.93 mg/dL 1.02(H) 1.00 1.33(H)  BUN/Creat Ratio 6 - 22 (calc) 14 11(L) 16  Sodium 135 - 146 mmol/L 139 140 141  Potassium 3.5 - 5.3 mmol/L 3.9 4.4 5.2  Chloride 98 - 110 mmol/L 102 102 102  CO2 20 - 32 mmol/L 29 26 26    Calcium 8.6 - 10.4 mg/dL 9.1 9.2 9.8    CBC Latest Ref Rng & Units 11/23/2017 07/10/2017 01/09/2017  WBC 3.8 - 10.8 Thousand/uL 9.8 7.5 6.2  Hemoglobin 11.7 - 15.5 g/dL 13.3 14.2 13.6  Hematocrit 35.0 - 45.0 % 39.9 41.7 41.1  Platelets 140 - 400 Thousand/uL 243 317 276    CXR: NNF  IMPRESSION:     ICD-10-CM   1. Mild persistent asthma without complication J19.14    The diagnosis of asthma is presumptive based on improvement with Breo inhaler and worsening of respiratory symptoms after discontinuation of this inhaler  PLAN:  Sample of Breo inhaler provided  Prescription sent for Advair, 1 inhalation twice a day  This will likely be the least expensive ICS/LABA  Prescription sent for albuterol rescue inhaler to be used as needed for breakthrough symptoms of shortness of breath, wheezing, chest tightness, cough  Follow-up in 3 to 4 months or sooner as needed   Merton Border, MD PCCM service Mobile [phone] Pager [phone] 05/02/2018  5:03 PM

## 2018-05-09 ENCOUNTER — Ambulatory Visit (INDEPENDENT_AMBULATORY_CARE_PROVIDER_SITE_OTHER): Payer: Medicare Other | Admitting: Family Medicine

## 2018-05-09 ENCOUNTER — Encounter: Payer: Self-pay | Admitting: Family Medicine

## 2018-05-09 VITALS — BP 114/64 | HR 74 | Temp 98.7°F | Resp 16

## 2018-05-09 DIAGNOSIS — F411 Generalized anxiety disorder: Secondary | ICD-10-CM | POA: Diagnosis not present

## 2018-05-09 DIAGNOSIS — I10 Essential (primary) hypertension: Secondary | ICD-10-CM | POA: Diagnosis not present

## 2018-05-09 DIAGNOSIS — G250 Essential tremor: Secondary | ICD-10-CM

## 2018-05-09 MED ORDER — DIAZEPAM 5 MG PO TABS: 5.0000 mg | ORAL_TABLET | Freq: Four times a day (QID) | ORAL | 2 refills | Status: DC | PRN

## 2018-05-09 MED ORDER — PROPRANOLOL HCL 20 MG PO TABS: 20.0000 mg | ORAL_TABLET | Freq: Two times a day (BID) | ORAL | 3 refills | Status: DC

## 2018-05-09 NOTE — Progress Notes (Signed)
[name] Female    DOB: 1946-08-15   72 y.o.   MRN: [id number] Visit Date: 05/09/2018  Today's Provider: Wilhemena Durie, MD   Chief Complaint  Patient presents with  . Tremors  . Hypertension   Subjective:    HPI Pt is here for a follow up of tremors and HTN. She stopped the Toprol and started inderal 20 mg BID. She reports that her tremors have resolved and she is not having any side effects. She reports that she does have about 30 minutes before bed when she feels her heart beat but much better than before or with no medications. She has not been checking her BP at home. Pt reports that she was given 30 Valium about 2 years ago. She only takes it when she has extreme anxiety. She would like a refill     Allergies  Allergen Reactions  . Anti-Inflammatory Enzyme [Nutritional Supplements] Other (See Comments)    REACTION: Due to bleeding ulcer  . Cymbalta [Duloxetine Hcl] Other (See Comments)    REACTION: Urinary retention  . Clarithromycin Other (See Comments)    Upsets stomach  . Nsaids     GI BLEED  . Ranitidine     severe diarrhea  . Venlafaxine     severe depression     Current Outpatient Medications:  .  albuterol (PROVENTIL HFA;VENTOLIN HFA) 108 (90 Base) MCG/ACT inhaler, Inhale 1-2 puffs into the lungs every 6 (six) hours as needed for wheezing or shortness of breath., Disp: 1 Inhaler, Rfl: 5 .  alendronate (FOSAMAX) 70 MG tablet, Take 1 tablet (70 mg total) by mouth every 7 (seven) days. Take with a full glass of water on an empty stomach., Disp: 4 tablet, Rfl: 11 .  AZO-CRANBERRY PO, Take by mouth as needed., Disp: , Rfl:  .  BIOTIN PO, Take 5,000 mcg by mouth daily. , Disp: , Rfl:  .  busPIRone (BUSPAR) 10 MG tablet, Take 10 mg by mouth 2 (two) times daily. , Disp: , Rfl:  .  Calcium Carbonate-Vitamin D (CALCIUM 600+D) 600-200 MG-UNIT TABS, Take 1 tablet by mouth daily., Disp: , Rfl:  .  citalopram (CELEXA) 40 MG tablet, 40 mg  daily. , Disp: , Rfl:  .  donepezil (ARICEPT) 5 MG tablet, Take 5 mg by mouth at bedtime., Disp: , Rfl:  .  fish oil-omega-3 fatty acids 1000 MG capsule, Take 1 g by mouth daily.  , Disp: , Rfl:  .  fluticasone (FLONASE) 50 MCG/ACT nasal spray, Place 2 sprays into both nostrils. , Disp: , Rfl:  .  Fluticasone-Salmeterol (ADVAIR DISKUS) 250-50 MCG/DOSE AEPB, Inhale 1 puff into the lungs 2 (two) times daily., Disp: 180 each, Rfl: 3 .  primidone (MYSOLINE) 50 MG tablet, Take 50 mg by mouth at bedtime. , Disp: , Rfl:  .  propranolol (INDERAL) 20 MG tablet, Take 1 tablet (20 mg total) by mouth 2 (two) times daily., Disp: 60 tablet, Rfl: 11 .  rosuvastatin (CRESTOR) 10 MG tablet, Take 1 tablet (10 mg total) by mouth daily., Disp: 90 tablet, Rfl: 3 .  traZODone (DESYREL) 100 MG tablet, Take 250 mg by mouth at bedtime. , Disp: , Rfl:  .  vitamin B-12 (CYANOCOBALAMIN) 1000 MCG tablet, Take 1,000 mcg by mouth daily., Disp: , Rfl:   Review of Systems  Constitutional: Negative.   HENT: Negative.   Eyes: Negative.   Respiratory: Negative.   Cardiovascular: Negative.   Gastrointestinal: Negative.  Endocrine: Negative.   Genitourinary: Negative.   Musculoskeletal: Positive for arthralgias.  Skin: Negative.   Allergic/Immunologic: Negative.   Neurological: Negative.   Hematological: Negative.   Psychiatric/Behavioral: Negative.     Social History   Tobacco Use  . Smoking status: Never Smoker  . Smokeless tobacco: Never Used  Substance Use Topics  . Alcohol use: Yes    Alcohol/week: 0.0 oz    Comment: drinks 3-4 times a year   Objective:   BP 114/64 (BP Location: Left Arm, Patient Position: Sitting, Cuff Size: Normal)   Pulse 74   Temp 98.7 F (37.1 C) (Oral)   Resp 16  Vitals:   05/09/18 0924  BP: 114/64  Pulse: 74  Resp: 16  Temp: 98.7 F (37.1 C)  TempSrc: Oral     Physical Exam  Constitutional: She is oriented to person, place, and time. She appears well-developed and  well-nourished.  HENT:  Head: Normocephalic and atraumatic.  Eyes: Conjunctivae are normal. No scleral icterus.  Neck: No thyromegaly present.  Cardiovascular: Normal rate, regular rhythm and normal heart sounds.  Pulmonary/Chest: Effort normal and breath sounds normal.  Abdominal: Soft.  Musculoskeletal:  Boot on left foot.  Neurological: She is alert and oriented to person, place, and time.  Skin: Skin is warm and dry.  Psychiatric: She has a normal mood and affect. Her behavior is normal. Judgment and thought content normal.        Assessment & Plan:     1. Essential hypertension  - propranolol (INDERAL) 20 MG tablet; Take 1 tablet (20 mg total) by mouth 2 (two) times daily.  Dispense: 180 tablet; Refill: 3  2. Benign essential tremor   3. Anxiety, generalized  - diazepam (VALIUM) 5 MG tablet; Take 1 tablet (5 mg total) by mouth every 6 (six) hours as needed for anxiety.  Dispense: 30 tablet; Refill: 2  I have done the exam and reviewed the chart and it is accurate to the best of my knowledge. Development worker, community has been used and  any errors in dictation or transcription are unintentional. Miguel Aschoff M.D. [place], MD  [facility]

## 2018-05-16 DIAGNOSIS — F5105 Insomnia due to other mental disorder: Secondary | ICD-10-CM | POA: Diagnosis not present

## 2018-05-16 DIAGNOSIS — F331 Major depressive disorder, recurrent, moderate: Secondary | ICD-10-CM | POA: Diagnosis not present

## 2018-05-17 DIAGNOSIS — S92355A Nondisplaced fracture of fifth metatarsal bone, left foot, initial encounter for closed fracture: Secondary | ICD-10-CM | POA: Diagnosis not present

## 2018-05-29 DIAGNOSIS — S92302D Fracture of unspecified metatarsal bone(s), left foot, subsequent encounter for fracture with routine healing: Secondary | ICD-10-CM | POA: Diagnosis not present

## 2018-05-29 DIAGNOSIS — M170 Bilateral primary osteoarthritis of knee: Secondary | ICD-10-CM | POA: Diagnosis not present

## 2018-07-17 ENCOUNTER — Telehealth: Payer: Self-pay | Admitting: Pulmonary Disease

## 2018-07-17 MED ORDER — FLUTICASONE-SALMETEROL 250-50 MCG/DOSE IN AEPB: 1.0000 | INHALATION_SPRAY | Freq: Two times a day (BID) | RESPIRATORY_TRACT | 3 refills | Status: DC

## 2018-07-17 NOTE — Telephone Encounter (Signed)
°*  STAT* If patient is at the pharmacy, call can be transferred to refill team.   1. Which medications need to be refilled? (please list name of each medication and dose if known) Advair Brand name requested   2. Which pharmacy/location (including street and city if local pharmacy) is medication to be sent to? [facility]  3. Do they need a 30 day or 90 day supply? [place]

## 2018-07-17 NOTE — Telephone Encounter (Signed)
RX sent for a 90 day supply to mail order pharmacy.

## 2018-08-03 DIAGNOSIS — M8448XD Pathological fracture, other site, subsequent encounter for fracture with routine healing: Secondary | ICD-10-CM | POA: Diagnosis not present

## 2018-08-03 DIAGNOSIS — R030 Elevated blood-pressure reading, without diagnosis of hypertension: Secondary | ICD-10-CM | POA: Diagnosis not present

## 2018-08-03 DIAGNOSIS — M4316 Spondylolisthesis, lumbar region: Secondary | ICD-10-CM | POA: Diagnosis not present

## 2018-08-21 ENCOUNTER — Encounter: Payer: Self-pay | Admitting: Pulmonary Disease

## 2018-08-21 ENCOUNTER — Ambulatory Visit: Payer: Self-pay | Admitting: Family Medicine

## 2018-08-21 ENCOUNTER — Ambulatory Visit (INDEPENDENT_AMBULATORY_CARE_PROVIDER_SITE_OTHER): Payer: Medicare Other | Admitting: Pulmonary Disease

## 2018-08-21 VITALS — BP 130/88 | HR 58 | Ht 67.0 in | Wt 190.0 lb

## 2018-08-21 DIAGNOSIS — J4530 Mild persistent asthma, uncomplicated: Secondary | ICD-10-CM | POA: Diagnosis not present

## 2018-08-21 NOTE — Progress Notes (Signed)
PULMONARY OFFICE F/U NOTE  Requesting MD/Service: Miguel Aschoff Date of initial consultation: 06/10/16 Reason for consultation: Dyspnea  PT PROFILE: 72 y.o. F referred for evaluation of unexplained exertional dyspnea.  Her dyspnea improved significantly with Breo inhaler.  Therefore, a diagnosis of asthma was made.  DATA: Echocardiogram 04/14/16: LVEF 19-62%, grade 2 diastolic dysfunction, LA mildly dilated Myoview 04/15/15: no evidence of ischemia PFTs 07/14/16: Normal spirometry, mild restriction, normal DLCO 6 MWT 07/14/16: 336 meters. Limited by arthritic pain in knees > dyspnea  INTERVAL: Last seen 05/20. No major events  SUBJ: This is a scheduled follow up.  She has no new complaints.  She remains on Advair and is using it compliantly.  She uses her albuterol inhaler approximately 1 time every 2 weeks. She denies CP, fever, purulent sputum, hemoptysis, LE edema and calf tenderness. She continues to report mild exertional dyspnea which she attributes to deconditioning.    OBJ:  Vitals:   08/21/18 1458 08/21/18 1503  BP:  130/88  Pulse:  (!) 58  SpO2:  97%  Weight: 190 lb (86.2 kg)   Height: 5\' 7"  (1.702 m)   RA  EXAM:  Gen: NAD HEENT: NCAT, sclera white Neck: No JVD Lungs: breath sounds full, no wheezes or other adventitious sounds Cardiovascular: RRR, no murmurs Abdomen: Soft, nontender, normal BS Ext: without clubbing, cyanosis, edema Neuro: grossly intact Skin: Limited exam, no lesions noted    DATA:   BMP Latest Ref Rng & Units 11/23/2017 01/09/2017 04/22/2016  Glucose 65 - 99 mg/dL 85 103(H) 104(H)  BUN 7 - 25 mg/dL 14 11 21   Creatinine 0.60 - 0.93 mg/dL 1.02(H) 1.00 1.33(H)  BUN/Creat Ratio 6 - 22 (calc) 14 11(L) 16  Sodium 135 - 146 mmol/L 139 140 141  Potassium 3.5 - 5.3 mmol/L 3.9 4.4 5.2  Chloride 98 - 110 mmol/L 102 102 102  CO2 20 - 32 mmol/L 29 26 26   Calcium 8.6 - 10.4 mg/dL 9.1 9.2 9.8    CBC Latest Ref Rng & Units 11/23/2017 07/10/2017  01/09/2017  WBC 3.8 - 10.8 Thousand/uL 9.8 7.5 6.2  Hemoglobin 11.7 - 15.5 g/dL 13.3 14.2 13.6  Hematocrit 35.0 - 45.0 % 39.9 41.7 41.1  Platelets 140 - 400 Thousand/uL 243 317 276    CXR: NNF  IMPRESSION:     ICD-10-CM   1. Mild persistent asthma without complication I29.79    The diagnosis of asthma is presumptive based on improvement with Breo inhaler and worsening of respiratory symptoms after discontinuation of this inhaler  PLAN:  Continue Advair inhaler twice a day.  Rinse mouth after use  Continue albuterol inhaler as needed for increased shortness of breath, chest tightness, wheezing, cough  She may also use albuterol inhaler 1 or 2 puffs prior to exercise  Follow-up in 6 months, call sooner as needed   Merton Border, MD PCCM service Mobile [phone] Pager [phone] 08/21/2018  3:49 PM

## 2018-08-21 NOTE — Patient Instructions (Signed)
Continue Advair inhaler twice a day.  Rinse mouth after use  Continue albuterol inhaler as needed for increased shortness of breath, chest tightness, wheezing, cough  You may also use albuterol inhaler 1 or 2 puffs prior to exercise  Follow-up in 6 months, call sooner as needed

## 2018-08-24 DIAGNOSIS — N3946 Mixed incontinence: Secondary | ICD-10-CM | POA: Diagnosis not present

## 2018-08-24 DIAGNOSIS — N3000 Acute cystitis without hematuria: Secondary | ICD-10-CM | POA: Diagnosis not present

## 2018-08-28 ENCOUNTER — Ambulatory Visit (INDEPENDENT_AMBULATORY_CARE_PROVIDER_SITE_OTHER): Payer: Medicare Other | Admitting: Family Medicine

## 2018-08-28 VITALS — BP 136/78 | HR 59 | Temp 98.1°F | Resp 16 | Wt 190.0 lb

## 2018-08-28 DIAGNOSIS — F3341 Major depressive disorder, recurrent, in partial remission: Secondary | ICD-10-CM

## 2018-08-28 DIAGNOSIS — B372 Candidiasis of skin and nail: Secondary | ICD-10-CM | POA: Diagnosis not present

## 2018-08-28 DIAGNOSIS — B354 Tinea corporis: Secondary | ICD-10-CM

## 2018-08-28 MED ORDER — ALBUTEROL SULFATE HFA 108 (90 BASE) MCG/ACT IN AERS: 1.0000 | INHALATION_SPRAY | Freq: Four times a day (QID) | RESPIRATORY_TRACT | 3 refills | Status: DC | PRN

## 2018-08-28 MED ORDER — FLUCONAZOLE 150 MG PO TABS: ORAL_TABLET | ORAL | 0 refills | Status: DC

## 2018-08-28 NOTE — Progress Notes (Signed)
Nancy Blackburn  MRN: [id number] DOB: 04/26/1946  Subjective:  HPI   The patient is a 72 year old female who presents for evaluation and treatment of facial fungus and fungus of her abdomen.  The patient states she gets the facial fungus every year in the summer.   The stomach fungus she states she gets whenever she is around her grandson's dog. She continues to be very stressed over her strained relationship with her son and hs second wife.  Patient Active Problem List   Diagnosis Date Noted  . Cervical spondylosis 05/04/2017  . Cervical radiculopathy 05/04/2017  . Family history of Cushing disease 01/12/2017  . B12 deficiency 12/18/2016  . Mild cognitive impairment with memory loss 12/18/2016  . Parkinsonian features 12/18/2016  . Vitamin D deficiency 12/18/2016  . Chalazion, bilateral 06/08/2016  . Absolute anemia 07/30/2015  . Edema extremities 07/30/2015  . Benign essential tremor 07/30/2015  . Anxiety, generalized 07/30/2015  . Gastro-esophageal reflux disease without esophagitis 07/30/2015  . HLD (hyperlipidemia) 07/30/2015  . Cannot sleep 07/30/2015  . Lumbar radiculopathy 07/30/2015  . Depression, major, recurrent, in partial remission ([place]) 07/30/2015  . Arthritis, degenerative 07/30/2015  . Allergic rhinitis 07/30/2015  . Gastroduodenal ulcer 07/30/2015  . Calcium blood increased 07/30/2015  . Pre-syncope 05/17/2015  . Dyspnea 03/31/2015  . Essential hypertension 03/31/2015  . Bleeding stomach ulcer 10/14/2013  . Anemia, iron deficiency 10/14/2013  . Weakness generalized 10/14/2013  . Ejection fraction   . Pericardial effusion   . Palpitations   . Low back pain   . Normal nuclear stress test     Past Medical History:  Diagnosis Date  . Chronic sinus infection   . Ejection fraction    a. EF 55-65%, echo, mild LVH, June, 2013, small pericardial effusion  . Fracture, sacrum/coccyx ([place])   . Hyperlipidemia    a. takes Simvastatin  . Hypertension      a. takes Metoprolol, Lisinopril, and amlodipine.  . Low back pain   . Mild dementia   . Nasal polyps   . Normal nuclear stress test    a. 07/2005 Nl nuc stress test.  . Osteopenia   . Osteoporosis   . Palpitations    a. June 2013:  Event recorder normal sinus rhythm with rare PVCs - ? symptomatic PVC's.  . Parkinson disease ([place])   . Pericardial effusion    a. Small, echo, June, 2013  . PONV (postoperative nausea and vomiting)     Social History   Socioeconomic History  . Marital status: Divorced    Spouse name: Not on file  . Number of children: 2  . Years of education: Not on file  . Highest education level: 12th grade  Occupational History  . Occupation: retired  Scientific laboratory technician  . Financial resource strain: Not hard at all  . Food insecurity:    Worry: Never true    Inability: Never true  . Transportation needs:    Medical: No    Non-medical: No  Tobacco Use  . Smoking status: Never Smoker  . Smokeless tobacco: Never Used  Substance and Sexual Activity  . Alcohol use: Yes    Alcohol/week: 0.0 standard drinks    Comment: drinks 3-4 times a year  . Drug use: No  . Sexual activity: Never  Lifestyle  . Physical activity:    Days per week: Not on file    Minutes per session: Not on file  . Stress: Not at all  Relationships  .  Social connections:    Talks on phone: Not on file    Gets together: Not on file    Attends religious service: Not on file    Active member of club or organization: Not on file    Attends meetings of clubs or organizations: Not on file    Relationship status: Not on file  . Intimate partner violence:    Fear of current or ex partner: Not on file    Emotionally abused: Not on file    Physically abused: Not on file    Forced sexual activity: Not on file  Other Topics Concern  . Not on file  Social History Narrative  . Not on file    Outpatient Encounter Medications as of 08/28/2018  Medication Sig Note  . albuterol (PROVENTIL  HFA;VENTOLIN HFA) 108 (90 Blackburn) MCG/ACT inhaler Inhale 1-2 puffs into the lungs every 6 (six) hours as needed for wheezing or shortness of breath.   Marland Kitchen alendronate (FOSAMAX) 70 MG tablet Take 1 tablet (70 mg total) by mouth every 7 (seven) days. Take with a full glass of water on an empty stomach.   . AZO-CRANBERRY PO Take by mouth as needed.   Marland Kitchen BIOTIN PO Take 5,000 mcg by mouth daily.    . busPIRone (BUSPAR) 10 MG tablet Take 10 mg by mouth 2 (two) times daily.    . Calcium Carbonate-Vitamin D (CALCIUM 600+D) 600-200 MG-UNIT TABS Take 1 tablet by mouth daily.   . citalopram (CELEXA) 40 MG tablet 40 mg daily.  08/31/2015: Received from: External Pharmacy  . diazepam (VALIUM) 5 MG tablet Take 1 tablet (5 mg total) by mouth every 6 (six) hours as needed for anxiety.   . donepezil (ARICEPT) 5 MG tablet Take 5 mg by mouth at bedtime.   . fluticasone (FLONASE) 50 MCG/ACT nasal spray Place 2 sprays into both nostrils.    . Fluticasone-Salmeterol (ADVAIR DISKUS) 250-50 MCG/DOSE AEPB Inhale 1 puff into the lungs 2 (two) times daily.   . primidone (MYSOLINE) 50 MG tablet Take 50 mg by mouth at bedtime.    . propranolol (INDERAL) 20 MG tablet Take 1 tablet (20 mg total) by mouth 2 (two) times daily.   . rosuvastatin (CRESTOR) 10 MG tablet Take 1 tablet (10 mg total) by mouth daily.   . traZODone (DESYREL) 100 MG tablet Take 250 mg by mouth at bedtime.    . vitamin B-12 (CYANOCOBALAMIN) 1000 MCG tablet Take 1,000 mcg by mouth daily.   . [DISCONTINUED] fish oil-omega-3 fatty acids 1000 MG capsule Take 1 g by mouth daily.      No facility-administered encounter medications on file as of 08/28/2018.     Allergies  Allergen Reactions  . Anti-Inflammatory Enzyme [Nutritional Supplements] Other (See Comments)    REACTION: Due to bleeding ulcer  . Cymbalta [Duloxetine Hcl] Other (See Comments)    REACTION: Urinary retention  . Clarithromycin Other (See Comments)    Upsets stomach  . Nsaids     GI BLEED  .  Ranitidine     severe diarrhea  . Venlafaxine     severe depression    Review of Systems  Constitutional: Negative for fever and malaise/fatigue.  HENT: Negative.   Eyes: Negative.   Respiratory: Positive for shortness of breath (chronic and unchanged). Negative for cough and wheezing.   Cardiovascular: Negative for chest pain and palpitations.  Gastrointestinal: Negative.   Skin: Positive for rash. Negative for itching.  Neurological: Negative.   Endo/Heme/Allergies: Negative.  Psychiatric/Behavioral: Negative.     Objective:  BP 136/78 (BP Location: Right Arm, Patient Position: Sitting, Cuff Size: Normal)   Pulse (!) 59   Temp 98.1 F (36.7 C) (Oral)   Resp 16   Wt 190 lb (86.2 kg)   BMI 29.76 kg/m   Physical Exam  Constitutional: She is oriented to person, place, and time and well-developed, well-nourished, and in no distress.  HENT:  Head: Normocephalic and atraumatic.  Right Ear: External ear normal.  Left Ear: External ear normal.  Nose: Nose normal.  Eyes: Conjunctivae are normal. No scleral icterus.  Neck: No thyromegaly present.  Cardiovascular: Normal rate, regular rhythm and normal heart sounds.  Pulmonary/Chest: Effort normal and breath sounds normal.  Abdominal: Soft.  Musculoskeletal: She exhibits no edema.  Neurological: She is alert and oriented to person, place, and time. Gait normal. GCS score is 15.  Skin: Skin is warm and dry.  Slight perioral rash.  Psychiatric: Memory, affect and judgment normal.    Assessment and Plan :  1. Yeast dermatitis Diflucan 150mg  daily for 2 days then repeat 2 weeks.  2. Tinea corporis Will follow. 3.Depression  I have done the exam and reviewed the chart and it is accurate to the best of my knowledge. Development worker, community has been used and  any errors in dictation or transcription are unintentional. Miguel Aschoff M.D. [facility]

## 2018-09-07 ENCOUNTER — Other Ambulatory Visit: Payer: Self-pay | Admitting: Family Medicine

## 2018-09-07 DIAGNOSIS — Z1231 Encounter for screening mammogram for malignant neoplasm of breast: Secondary | ICD-10-CM

## 2018-09-12 DIAGNOSIS — F331 Major depressive disorder, recurrent, moderate: Secondary | ICD-10-CM | POA: Diagnosis not present

## 2018-09-12 DIAGNOSIS — F411 Generalized anxiety disorder: Secondary | ICD-10-CM | POA: Diagnosis not present

## 2018-09-12 DIAGNOSIS — F5105 Insomnia due to other mental disorder: Secondary | ICD-10-CM | POA: Diagnosis not present

## 2018-09-14 DIAGNOSIS — M170 Bilateral primary osteoarthritis of knee: Secondary | ICD-10-CM | POA: Diagnosis not present

## 2018-09-15 ENCOUNTER — Other Ambulatory Visit: Payer: Self-pay | Admitting: Family Medicine

## 2018-09-15 ENCOUNTER — Ambulatory Visit (INDEPENDENT_AMBULATORY_CARE_PROVIDER_SITE_OTHER): Payer: Medicare Other | Admitting: Family Medicine

## 2018-09-15 DIAGNOSIS — N3090 Cystitis, unspecified without hematuria: Secondary | ICD-10-CM | POA: Diagnosis not present

## 2018-09-15 LAB — POCT URINALYSIS DIPSTICK
Blood, UA: NEGATIVE
Spec Grav, UA: 1.01 (ref 1.010–1.025)
pH, UA: 7 (ref 5.0–8.0)

## 2018-09-15 MED ORDER — CEPHALEXIN 500 MG PO CAPS: 500.0000 mg | ORAL_CAPSULE | Freq: Two times a day (BID) | ORAL | 0 refills | Status: DC

## 2018-09-15 NOTE — Patient Instructions (Signed)
We will call you with the urine culture result. 

## 2018-09-15 NOTE — Progress Notes (Signed)
  Subjective:     Patient ID: Nancy Blackburn, female   DOB: 1946-02-22, 72 y.o.   MRN: [id number] Chief Complaint  Patient presents with  . Urinary Tract Infection   HPI States she developed incontinence and urinary frequency in the last 24 hours. Feels feverish with malaise. Has been using AZO for her sx.  Review of Systems     Objective:   Physical Exam  Constitutional: She appears well-developed and well-nourished. No distress.  Genitourinary:  Genitourinary Comments: No cva tenderness       Assessment:    1. Cystitis - POCT urinalysis dipstick - Urine Culture - cephALEXin (KEFLEX) 500 MG capsule; Take 1 capsule (500 mg total) by mouth 2 (two) times daily.  Dispense: 14 capsule; Refill: 0    Plan:    Further f/u pending urine culture results.

## 2018-09-18 ENCOUNTER — Telehealth: Payer: Self-pay

## 2018-09-18 LAB — URINE CULTURE

## 2018-09-18 NOTE — Telephone Encounter (Signed)
-----   Message from Carmon Ginsberg, [place] sent at 09/18/2018  1:39 PM EDT ----- Low colony count E. Coli infection which should respond to cephalexin that she is taking

## 2018-09-18 NOTE — Telephone Encounter (Signed)
Patient advised.KW 

## 2018-09-20 DIAGNOSIS — R259 Unspecified abnormal involuntary movements: Secondary | ICD-10-CM | POA: Diagnosis not present

## 2018-09-20 DIAGNOSIS — G250 Essential tremor: Secondary | ICD-10-CM | POA: Diagnosis not present

## 2018-09-20 DIAGNOSIS — E559 Vitamin D deficiency, unspecified: Secondary | ICD-10-CM | POA: Diagnosis not present

## 2018-09-20 DIAGNOSIS — E538 Deficiency of other specified B group vitamins: Secondary | ICD-10-CM | POA: Diagnosis not present

## 2018-09-20 DIAGNOSIS — Z79899 Other long term (current) drug therapy: Secondary | ICD-10-CM | POA: Diagnosis not present

## 2018-10-02 DIAGNOSIS — F411 Generalized anxiety disorder: Secondary | ICD-10-CM | POA: Diagnosis not present

## 2018-10-02 DIAGNOSIS — F5105 Insomnia due to other mental disorder: Secondary | ICD-10-CM | POA: Diagnosis not present

## 2018-10-02 DIAGNOSIS — F331 Major depressive disorder, recurrent, moderate: Secondary | ICD-10-CM | POA: Diagnosis not present

## 2018-10-08 ENCOUNTER — Ambulatory Visit
Admission: RE | Admit: 2018-10-08 | Discharge: 2018-10-08 | Disposition: A | Discharge status: Home / Self Care | Payer: Medicare Other | Source: Ambulatory Visit | Attending: Family Medicine | Admitting: Family Medicine

## 2018-10-08 DIAGNOSIS — Z1231 Encounter for screening mammogram for malignant neoplasm of breast: Secondary | ICD-10-CM | POA: Diagnosis not present

## 2018-10-10 ENCOUNTER — Encounter: Payer: Self-pay | Admitting: Family Medicine

## 2018-10-10 ENCOUNTER — Ambulatory Visit (INDEPENDENT_AMBULATORY_CARE_PROVIDER_SITE_OTHER): Payer: Medicare Other | Admitting: Family Medicine

## 2018-10-10 VITALS — BP 118/64 | HR 58 | Temp 98.1°F | Resp 16 | Wt 189.0 lb

## 2018-10-10 DIAGNOSIS — I10 Essential (primary) hypertension: Secondary | ICD-10-CM | POA: Diagnosis not present

## 2018-10-10 DIAGNOSIS — E7800 Pure hypercholesterolemia, unspecified: Secondary | ICD-10-CM

## 2018-10-10 DIAGNOSIS — Z23 Encounter for immunization: Secondary | ICD-10-CM

## 2018-10-10 DIAGNOSIS — F3341 Major depressive disorder, recurrent, in partial remission: Secondary | ICD-10-CM | POA: Diagnosis not present

## 2018-10-10 NOTE — Progress Notes (Signed)
[name] Female    DOB: December 03, 1946   72 y.o.   MRN: [id number] Visit Date: 10/10/2018  Today's Provider: Wilhemena Durie, MD   Chief Complaint  Patient presents with  . Hypertension   Subjective:    HPI  Hypertension, follow-up:  BP Readings from Last 3 Encounters:  10/10/18 118/64  08/28/18 136/78  08/21/18 130/88    She was last seen for hypertension 5 months ago.  BP at that visit was 114/64. Management since that visit includes increasing propranolol to 30mg  twice daily per Neurologist . She reports good compliance with treatment. She is not having side effects.  She is exercising. She is adherent to low salt diet.   Outside blood pressures are checked occasionally. Patient denies exertional chest pressure/discomfort and lower extremity edema.   Cardiovascular risk factors include dyslipidemia.   Weight trend: stable Wt Readings from Last 3 Encounters:  10/10/18 189 lb (85.7 kg)  08/28/18 190 lb (86.2 kg)  08/21/18 190 lb (86.2 kg)    Current diet: well balanced    Allergies  Allergen Reactions  . Anti-Inflammatory Enzyme [Nutritional Supplements] Other (See Comments)    REACTION: Due to bleeding ulcer  . Cymbalta [Duloxetine Hcl] Other (See Comments)    REACTION: Urinary retention  . Clarithromycin Other (See Comments)    Upsets stomach  . Nsaids     GI BLEED  . Ranitidine     severe diarrhea  . Venlafaxine     severe depression     Current Outpatient Medications:  .  albuterol (PROVENTIL HFA;VENTOLIN HFA) 108 (90 Base) MCG/ACT inhaler, Inhale 1-2 puffs into the lungs every 6 (six) hours as needed for wheezing or shortness of breath., Disp: 3 Inhaler, Rfl: 3 .  alendronate (FOSAMAX) 70 MG tablet, Take 1 tablet (70 mg total) by mouth every 7 (seven) days. Take with a full glass of water on an empty stomach., Disp: 4 tablet, Rfl: 11 .  AZO-CRANBERRY PO, Take by mouth as needed., Disp: , Rfl:  .  BIOTIN PO, Take 5,000  mcg by mouth daily. , Disp: , Rfl:  .  busPIRone (BUSPAR) 10 MG tablet, Take 10 mg by mouth 2 (two) times daily. , Disp: , Rfl:  .  Calcium Carbonate-Vitamin D (CALCIUM 600+D) 600-200 MG-UNIT TABS, Take 1 tablet by mouth daily., Disp: , Rfl:  .  citalopram (CELEXA) 40 MG tablet, 40 mg daily. , Disp: , Rfl:  .  diazepam (VALIUM) 5 MG tablet, Take 1 tablet (5 mg total) by mouth every 6 (six) hours as needed for anxiety., Disp: 30 tablet, Rfl: 2 .  donepezil (ARICEPT) 5 MG tablet, Take 5 mg by mouth at bedtime., Disp: , Rfl:  .  fluticasone (FLONASE) 50 MCG/ACT nasal spray, Place 2 sprays into both nostrils. , Disp: , Rfl:  .  Fluticasone-Salmeterol (ADVAIR DISKUS) 250-50 MCG/DOSE AEPB, Inhale 1 puff into the lungs 2 (two) times daily., Disp: 180 each, Rfl: 3 .  primidone (MYSOLINE) 50 MG tablet, Take 50 mg by mouth at bedtime. , Disp: , Rfl:  .  propranolol (INDERAL) 20 MG tablet, Take 1 tablet (20 mg total) by mouth 2 (two) times daily., Disp: 180 tablet, Rfl: 3 .  rosuvastatin (CRESTOR) 10 MG tablet, Take 1 tablet (10 mg total) by mouth daily., Disp: 90 tablet, Rfl: 3 .  traZODone (DESYREL) 100 MG tablet, Take 250 mg by mouth at bedtime. , Disp: , Rfl:  .  vitamin B-12 (CYANOCOBALAMIN)  1000 MCG tablet, Take 1,000 mcg by mouth daily., Disp: , Rfl:  .  cephALEXin (KEFLEX) 500 MG capsule, Take 1 capsule (500 mg total) by mouth 2 (two) times daily. (Patient not taking: Reported on 10/10/2018), Disp: 14 capsule, Rfl: 0 .  fluconazole (DIFLUCAN) 150 MG tablet, 1 tab daily for 2 days then repeat in 2 weeks (Patient not taking: Reported on 10/10/2018), Disp: 4 tablet, Rfl: 0  Review of Systems  Constitutional: Negative.   HENT: Negative.   Eyes: Negative.   Respiratory: Negative.   Cardiovascular: Negative.   Gastrointestinal: Negative.   Endocrine: Negative.   Musculoskeletal: Negative.   Skin: Negative.   Allergic/Immunologic: Negative.   Neurological: Negative.   Psychiatric/Behavioral:  Negative.     Social History   Tobacco Use  . Smoking status: Never Smoker  . Smokeless tobacco: Never Used  Substance Use Topics  . Alcohol use: Yes    Alcohol/week: 0.0 standard drinks    Comment: drinks 3-4 times a year   Objective:   BP 118/64 (BP Location: Right Arm, Patient Position: Sitting, Cuff Size: Normal)   Pulse (!) 58   Temp 98.1 F (36.7 C)   Resp 16   Wt 189 lb (85.7 kg)   SpO2 98%   BMI 29.60 kg/m  Vitals:   10/10/18 1326  BP: 118/64  Pulse: (!) 58  Resp: 16  Temp: 98.1 F (36.7 C)  SpO2: 98%  Weight: 189 lb (85.7 kg)     Physical Exam  Constitutional: She is oriented to person, place, and time. She appears well-developed and well-nourished.  HENT:  Head: Normocephalic and atraumatic.  Eyes: Conjunctivae are normal. No scleral icterus.  Neck: No thyromegaly present.  Cardiovascular: Normal rate, regular rhythm and normal heart sounds.  Pulmonary/Chest: Effort normal and breath sounds normal.  Abdominal: Soft.  Musculoskeletal: She exhibits no edema.  Neurological: She is alert and oriented to person, place, and time.  Skin: Skin is warm and dry.  Psychiatric: She has a normal mood and affect. Her behavior is normal. Judgment and thought content normal.        Assessment & Plan:     1. Essential hypertension Controlled.  2. Depression, major, recurrent, in partial remission (HCC) Partial remission.  3. Pure hypercholesterolemia   4. Need for immunization against influenza  - Flu vaccine HIGH DOSE PF (Fluzone High dose)       I have done the exam and reviewed the above chart and it is accurate to the best of my knowledge. Development worker, community has been used in this note in any air is in the dictation or transcription are unintentional.  Wilhemena Durie, MD  [place]

## 2018-10-11 ENCOUNTER — Other Ambulatory Visit: Payer: Self-pay | Admitting: Neurology

## 2018-10-11 DIAGNOSIS — R1310 Dysphagia, unspecified: Secondary | ICD-10-CM | POA: Diagnosis not present

## 2018-10-11 DIAGNOSIS — R1312 Dysphagia, oropharyngeal phase: Secondary | ICD-10-CM

## 2018-10-18 ENCOUNTER — Ambulatory Visit
Admission: RE | Admit: 2018-10-18 | Discharge: 2018-10-18 | Disposition: A | Discharge status: Home / Self Care | Payer: Medicare Other | Source: Ambulatory Visit | Attending: Neurology | Admitting: Neurology

## 2018-10-18 DIAGNOSIS — R1314 Dysphagia, pharyngoesophageal phase: Secondary | ICD-10-CM

## 2018-10-18 DIAGNOSIS — R1310 Dysphagia, unspecified: Secondary | ICD-10-CM | POA: Insufficient documentation

## 2018-10-18 DIAGNOSIS — R1312 Dysphagia, oropharyngeal phase: Secondary | ICD-10-CM

## 2018-10-18 NOTE — Therapy (Addendum)
[facility] [place], [place], [address] Phone: [phone]   Fax:     Modified Barium Swallow  Patient Details  Name: Nancy Blackburn MRN: [id number] Date of Birth: [birth year] No data recorded  Encounter Date: 10/18/2018  End of Session - 10/18/18 1525    Visit Number  1    Number of Visits  1    Date for SLP Re-Evaluation  10/18/18    SLP Start Time  1250    SLP Stop Time   1400    SLP Time Calculation (min)  70 min    Activity Tolerance  Patient tolerated treatment well       Past Medical History:  Diagnosis Date  . Chronic sinus infection   . Ejection fraction    a. EF 55-65%, echo, mild LVH, June, 2013, small pericardial effusion  . Fracture, sacrum/coccyx ([place])   . Hyperlipidemia    a. takes Simvastatin  . Hypertension    a. takes Metoprolol, Lisinopril, and amlodipine.  . Low back pain   . Mild dementia ([place])   . Nasal polyps   . Normal nuclear stress test    a. 07/2005 Nl nuc stress test.  . Osteopenia   . Osteoporosis   . Palpitations    a. June 2013:  Event recorder normal sinus rhythm with rare PVCs - ? symptomatic PVC's.  . Parkinson disease ([place])   . Pericardial effusion    a. Small, echo, June, 2013  . PONV (postoperative nausea and vomiting)     Past Surgical History:  Procedure Laterality Date  . BACK SURGERY  08/2010  . BLADDER SUSPENSION  2003  . bone spur  2009   removed from right collar bone area and partial collar bone removed  . THYROIDECTOMY, PARTIAL    . TONSILLECTOMY     as child  . TUBAL LIGATION     74yrs ago  . VAGINAL HYSTERECTOMY     2yrs ago d/t cervical cancer    There were no vitals filed for this visit.     Subjective: Patient behavior: (alertness, ability to follow instructions, etc.): pt was alert, verbally conversive and elaborated on her swallowing discomfort describing difficulty w/ Pills and intermittent difficulty during meals - "it  catches here" as she pointed to her sternal notch area. Of note, pt stated she had SIGNIFICANT GERD w/ REGURGITATION over years but stopped taking her PPI d/t "it was better now". Noted she BELCHED during this session prior to any po's given. Pt has native dentition; OM exam grossly [facility] w/ no unilateral weakness. Vocal quality clear; volume appropriate. Pt seemed min nervous but she smiled intermittently. No gross cognitive-linguistic deficits noted during conversation.   Chief complaint: dysphagia. Pt has NOT been dx'd w/ pneumonia or treated for any pulmonary issues for 1+ years per pt report.     Objective:  Radiological Procedure: A videoflouroscopic evaluation of oral-preparatory, reflex initiation, and pharyngeal phases of the swallow was performed; as well as a screening of the upper esophageal phase.  I. POSTURE: upright  II. VIEW: lateral  III. COMPENSATORY STRATEGIES: TIME b/t trials to allow for Esophageal clearing IV. BOLUSES ADMINISTERED:  Thin Liquid: 6 trials  Nectar-thick Liquid: 1 trial  Honey-thick Liquid: NT  Puree: 3 trials  Mechanical Soft: 2 trials  Barium tablet in puree: 1 tablet in applesauce  V. RESULTS OF EVALUATION: A. ORAL PREPARATORY PHASE: (The lips, tongue, and velum are observed for strength and coordination)       **  Overall Severity Rating: WFL. Bolus management and A-P transfer for swallow; oral clearing was appropriate post each trial.  B. SWALLOW INITIATION/REFLEX: (The reflex is normal if "triggered" by the time the bolus reached the base of the tongue)  **Overall Severity Rating: [facility].  Timely pharyngeal swallowing initiation w/ trials triggering the swallow at the base of tonge-valleculae.  C. PHARYNGEAL PHASE: (Pharyngeal function is normal if the bolus shows rapid, smooth, and continuous transit through the pharynx and there is no pharyngeal residue after the swallow)  **Overall Severity Rating: [facility]. No pharyngeal residue remained indicating adequate  pharyngeal pressure and laryngeal excursion during the swallowing.   D. LARYNGEAL PENETRATION: (Material entering into the laryngeal inlet/vestibule but not aspirated): NONE E. ASPIRATION: NONE F. ESOPHAGEAL PHASE: (Screening of the upper esophagus): noted Mild-Moderate cervical-thoracic dysmotility during po trials w/ SLOW distal clearing w/ po's and when given tablet, and even sips of liquids to clear it. Pt even pointed to, and verbally indicated, an area of discomfort (mid-chest area).     ASSESSMENT: Pt appears to present w/ adequate oropharyngeal phase swallowing function w/ NO oropharyngeal phase dysphagia witnessed during this exam. HOWEVER, pt exhibited apparent Mild-Moderate cervical-thoracic dysmotility during po trials given w/ SLOW distal clearing especially w/ the Barium tablet given and the sips of liquids to clear it. Pt even pointed to, and verbally indicated, an area of discomfort (mid-chest area). NO laryngeal penetration or aspiration noted; oral phase was [facility] w/ bolus management and A-P transfer for swallow; oral clearing was appropriate post each trial. During the pharyngeal phase, pt exhibited timely pharyngeal swallowing initiation w/ trials triggering the swallow at the base of tonge-valleculae. No pharyngeal residue remained indicating adequate pharyngeal pressure and laryngeal excursion during the swallowing.  Due to pt's presentation of Esophageal dysmotility, pt was given general precautions on REFLUX and the recommendation to f/u w/ PCP and GI for further assessment and management/tx. Pt stated she is not on a PPI currently. She DID endorse years of significant REFLUX w/ REGURGITATION ("even if I just leaned over it came out") which could be impacting her Esophageal motility today thus causing her discomfort w/ Pills and during meals/foods intermittently.   PLAN/RECOMMENDATIONS:  A. Diet: Mech Soft type foods (meats cut small, moistened foods, avoid breads); Thin liquids.  Pills swallowed w/ PUREE for easier clearing  B. Swallowing Precautions: general aspiration precautions; REFLUX precautions   C. Recommended consultation to: GI for further Esophageal management and tx  D. Therapy recommendations: NONE  E. Results and recommendations were discussed w/ pt, video viewed, questions answered; instructions given. Pt agreed w/ above.             Dysphagia, pharyngoesophageal phase  Oropharyngeal dysphagia - Plan: DG OP Swallowing Func-Medicare/Speech Path, DG OP Swallowing Func-Medicare/Speech Path        Problem List Patient Active Problem List   Diagnosis Date Noted  . Cervical spondylosis 05/04/2017  . Cervical radiculopathy 05/04/2017  . Family history of Cushing disease 01/12/2017  . B12 deficiency 12/18/2016  . Mild cognitive impairment with memory loss 12/18/2016  . Parkinsonian features 12/18/2016  . Vitamin D deficiency 12/18/2016  . Chalazion, bilateral 06/08/2016  . Absolute anemia 07/30/2015  . Edema extremities 07/30/2015  . Benign essential tremor 07/30/2015  . Anxiety, generalized 07/30/2015  . Gastro-esophageal reflux disease without esophagitis 07/30/2015  . HLD (hyperlipidemia) 07/30/2015  . Cannot sleep 07/30/2015  . Lumbar radiculopathy 07/30/2015  . Depression, major, recurrent, in partial remission ([place]) 07/30/2015  . Arthritis, degenerative 07/30/2015  .  Allergic rhinitis 07/30/2015  . Gastroduodenal ulcer 07/30/2015  . Calcium blood increased 07/30/2015  . Pre-syncope 05/17/2015  . Dyspnea 03/31/2015  . Essential hypertension 03/31/2015  . Bleeding stomach ulcer 10/14/2013  . Anemia, iron deficiency 10/14/2013  . Weakness generalized 10/14/2013  . Ejection fraction   . Pericardial effusion   . Palpitations   . Low back pain   . Normal nuclear stress test       Orinda Kenner, MS, CCC-SLP Kadeem Hyle 10/18/2018, 3:26 PM  [facility] [place], [place], [address] Phone: [phone]   Fax:     Name: VERLENA MARLETTE MRN: [id number] Date of Birth: [birth year]

## 2018-11-15 DIAGNOSIS — H26491 Other secondary cataract, right eye: Secondary | ICD-10-CM | POA: Diagnosis not present

## 2018-11-23 DIAGNOSIS — Z8639 Personal history of other endocrine, nutritional and metabolic disease: Secondary | ICD-10-CM | POA: Diagnosis not present

## 2018-12-10 DIAGNOSIS — R1310 Dysphagia, unspecified: Secondary | ICD-10-CM | POA: Diagnosis not present

## 2018-12-10 DIAGNOSIS — R197 Diarrhea, unspecified: Secondary | ICD-10-CM | POA: Diagnosis not present

## 2018-12-10 DIAGNOSIS — K219 Gastro-esophageal reflux disease without esophagitis: Secondary | ICD-10-CM | POA: Diagnosis not present

## 2018-12-10 DIAGNOSIS — D509 Iron deficiency anemia, unspecified: Secondary | ICD-10-CM | POA: Diagnosis not present

## 2018-12-21 DIAGNOSIS — M170 Bilateral primary osteoarthritis of knee: Secondary | ICD-10-CM | POA: Diagnosis not present

## 2018-12-27 DIAGNOSIS — F411 Generalized anxiety disorder: Secondary | ICD-10-CM | POA: Diagnosis not present

## 2018-12-27 DIAGNOSIS — F331 Major depressive disorder, recurrent, moderate: Secondary | ICD-10-CM | POA: Diagnosis not present

## 2018-12-27 DIAGNOSIS — F5105 Insomnia due to other mental disorder: Secondary | ICD-10-CM | POA: Diagnosis not present

## 2019-01-02 DIAGNOSIS — Z79899 Other long term (current) drug therapy: Secondary | ICD-10-CM | POA: Diagnosis not present

## 2019-01-02 DIAGNOSIS — F331 Major depressive disorder, recurrent, moderate: Secondary | ICD-10-CM | POA: Diagnosis not present

## 2019-01-15 DIAGNOSIS — Z800 Family history of malignant neoplasm of digestive organs: Secondary | ICD-10-CM | POA: Diagnosis not present

## 2019-01-15 DIAGNOSIS — R1310 Dysphagia, unspecified: Secondary | ICD-10-CM | POA: Diagnosis not present

## 2019-01-15 DIAGNOSIS — K2930 Chronic superficial gastritis without bleeding: Secondary | ICD-10-CM | POA: Diagnosis not present

## 2019-01-15 DIAGNOSIS — K449 Diaphragmatic hernia without obstruction or gangrene: Secondary | ICD-10-CM | POA: Diagnosis not present

## 2019-01-15 DIAGNOSIS — K2960 Other gastritis without bleeding: Secondary | ICD-10-CM | POA: Diagnosis not present

## 2019-01-16 DIAGNOSIS — F5105 Insomnia due to other mental disorder: Secondary | ICD-10-CM | POA: Diagnosis not present

## 2019-01-16 DIAGNOSIS — F411 Generalized anxiety disorder: Secondary | ICD-10-CM | POA: Diagnosis not present

## 2019-01-16 DIAGNOSIS — F331 Major depressive disorder, recurrent, moderate: Secondary | ICD-10-CM | POA: Diagnosis not present

## 2019-01-23 ENCOUNTER — Telehealth: Payer: Self-pay | Admitting: Family Medicine

## 2019-01-23 ENCOUNTER — Ambulatory Visit (INDEPENDENT_AMBULATORY_CARE_PROVIDER_SITE_OTHER): Payer: Medicare Other | Admitting: Family Medicine

## 2019-01-23 VITALS — BP 146/92 | HR 63 | Temp 99.5°F | Resp 18 | Wt 188.0 lb

## 2019-01-23 DIAGNOSIS — R05 Cough: Secondary | ICD-10-CM | POA: Diagnosis not present

## 2019-01-23 DIAGNOSIS — R059 Cough, unspecified: Secondary | ICD-10-CM

## 2019-01-23 DIAGNOSIS — J329 Chronic sinusitis, unspecified: Secondary | ICD-10-CM

## 2019-01-23 MED ORDER — PREDNISONE 5 MG (21) PO TBPK: ORAL_TABLET | ORAL | 0 refills | Status: DC

## 2019-01-23 MED ORDER — DOXYCYCLINE HYCLATE 100 MG PO TABS: 100.0000 mg | ORAL_TABLET | Freq: Two times a day (BID) | ORAL | 0 refills | Status: DC

## 2019-01-23 NOTE — Progress Notes (Signed)
Nancy Blackburn  MRN: [id number] DOB: [birth year]  Subjective:  HPI   The patient is a 73 year old female who presents for evaluation of cold symptoms.  She states the symptoms have been presents for 5 days.  She has had cough that was at first productive but now she states she feels too tight to cough anything up.  She has been having chills and body aches.  Patient Active Problem List   Diagnosis Date Noted  . Cervical spondylosis 05/04/2017  . Cervical radiculopathy 05/04/2017  . Family history of Cushing disease 01/12/2017  . B12 deficiency 12/18/2016  . Mild cognitive impairment with memory loss 12/18/2016  . Parkinsonian features 12/18/2016  . Vitamin D deficiency 12/18/2016  . Chalazion, bilateral 06/08/2016  . Absolute anemia 07/30/2015  . Edema extremities 07/30/2015  . Benign essential tremor 07/30/2015  . Anxiety, generalized 07/30/2015  . Gastro-esophageal reflux disease without esophagitis 07/30/2015  . HLD (hyperlipidemia) 07/30/2015  . Cannot sleep 07/30/2015  . Lumbar radiculopathy 07/30/2015  . Depression, major, recurrent, in partial remission ([place]) 07/30/2015  . Arthritis, degenerative 07/30/2015  . Allergic rhinitis 07/30/2015  . Gastroduodenal ulcer 07/30/2015  . Calcium blood increased 07/30/2015  . Pre-syncope 05/17/2015  . Dyspnea 03/31/2015  . Essential hypertension 03/31/2015  . Bleeding stomach ulcer 10/14/2013  . Anemia, iron deficiency 10/14/2013  . Weakness generalized 10/14/2013  . Ejection fraction   . Pericardial effusion   . Palpitations   . Low back pain   . Normal nuclear stress test     Past Medical History:  Diagnosis Date  . Chronic sinus infection   . Ejection fraction    a. EF 55-65%, echo, mild LVH, June, 2013, small pericardial effusion  . Fracture, sacrum/coccyx ([place])   . Hyperlipidemia    a. takes Simvastatin  . Hypertension    a. takes Metoprolol, Lisinopril, and amlodipine.  . Low back pain   . Mild dementia  ([place])   . Nasal polyps   . Normal nuclear stress test    a. 07/2005 Nl nuc stress test.  . Osteopenia   . Osteoporosis   . Palpitations    a. June 2013:  Event recorder normal sinus rhythm with rare PVCs - ? symptomatic PVC's.  . Parkinson disease ([place])   . Pericardial effusion    a. Small, echo, June, 2013  . PONV (postoperative nausea and vomiting)     Social History   Socioeconomic History  . Marital status: Divorced    Spouse name: Not on file  . Number of children: 2  . Years of education: Not on file  . Highest education level: 12th grade  Occupational History  . Occupation: retired  Scientific laboratory technician  . Financial resource strain: Not hard at all  . Food insecurity:    Worry: Never true    Inability: Never true  . Transportation needs:    Medical: No    Non-medical: No  Tobacco Use  . Smoking status: Never Smoker  . Smokeless tobacco: Never Used  Substance and Sexual Activity  . Alcohol use: Yes    Alcohol/week: 0.0 standard drinks    Comment: drinks 3-4 times a year  . Drug use: No  . Sexual activity: Never  Lifestyle  . Physical activity:    Days per week: Not on file    Minutes per session: Not on file  . Stress: Not at all  Relationships  . Social connections:    Talks on phone: Not on file  Gets together: Not on file    Attends religious service: Not on file    Active member of club or organization: Not on file    Attends meetings of clubs or organizations: Not on file    Relationship status: Not on file  . Intimate partner violence:    Fear of current or ex partner: Not on file    Emotionally abused: Not on file    Physically abused: Not on file    Forced sexual activity: Not on file  Other Topics Concern  . Not on file  Social History Narrative  . Not on file    Outpatient Encounter Medications as of 01/23/2019  Medication Sig Note  . albuterol (PROVENTIL HFA;VENTOLIN HFA) 108 (90 Base) MCG/ACT inhaler Inhale 1-2 puffs into the lungs every 6  (six) hours as needed for wheezing or shortness of breath.   Marland Kitchen alendronate (FOSAMAX) 70 MG tablet Take 1 tablet (70 mg total) by mouth every 7 (seven) days. Take with a full glass of water on an empty stomach.   . ARIPiprazole (ABILIFY) 5 MG tablet Take 5 mg by mouth daily.   Marland Kitchen BIOTIN PO Take 5,000 mcg by mouth daily.    . busPIRone (BUSPAR) 10 MG tablet Take 10 mg by mouth 2 (two) times daily.    . Calcium Carbonate-Vitamin D (CALCIUM 600+D) 600-200 MG-UNIT TABS Take 1 tablet by mouth daily.   . citalopram (CELEXA) 40 MG tablet 40 mg daily.  08/31/2015: Received from: External Pharmacy  . diazepam (VALIUM) 5 MG tablet Take 1 tablet (5 mg total) by mouth every 6 (six) hours as needed for anxiety.   . donepezil (ARICEPT) 5 MG tablet Take 5 mg by mouth at bedtime.   . fluticasone (FLONASE) 50 MCG/ACT nasal spray Place 2 sprays into both nostrils.    . Fluticasone-Salmeterol (ADVAIR DISKUS) 250-50 MCG/DOSE AEPB Inhale 1 puff into the lungs 2 (two) times daily.   . propranolol (INDERAL) 20 MG tablet Take 1 tablet (20 mg total) by mouth 2 (two) times daily. (Patient taking differently: Take 40 mg by mouth 2 (two) times daily. )   . rosuvastatin (CRESTOR) 10 MG tablet Take 1 tablet (10 mg total) by mouth daily.   . traZODone (DESYREL) 100 MG tablet Take 250 mg by mouth at bedtime.    . vitamin B-12 (CYANOCOBALAMIN) 1000 MCG tablet Take 1,000 mcg by mouth daily.   . AZO-CRANBERRY PO Take by mouth as needed.   . primidone (MYSOLINE) 50 MG tablet Take 50 mg by mouth at bedtime.    . [DISCONTINUED] cephALEXin (KEFLEX) 500 MG capsule Take 1 capsule (500 mg total) by mouth 2 (two) times daily. (Patient not taking: Reported on 10/10/2018)   . [DISCONTINUED] fluconazole (DIFLUCAN) 150 MG tablet 1 tab daily for 2 days then repeat in 2 weeks (Patient not taking: Reported on 10/10/2018)    No facility-administered encounter medications on file as of 01/23/2019.     Allergies  Allergen Reactions  .  Anti-Inflammatory Enzyme [Nutritional Supplements] Other (See Comments)    REACTION: Due to bleeding ulcer  . Cymbalta [Duloxetine Hcl] Other (See Comments)    REACTION: Urinary retention  . Clarithromycin Other (See Comments)    Upsets stomach  . Nsaids     GI BLEED  . Ranitidine     severe diarrhea  . Venlafaxine     severe depression    Review of Systems  Constitutional: Positive for chills and malaise/fatigue. Negative for fever.  HENT: Positive  for sinus pain and sore throat (patient thinks it is from coughing). Negative for congestion, ear discharge, ear pain, hearing loss, nosebleeds and tinnitus.   Eyes: Negative for blurred vision and double vision.  Respiratory: Positive for cough, sputum production (had some but not now), shortness of breath and wheezing.   Cardiovascular: Positive for orthopnea (patient states she has been stacking pillows to sleep). Negative for chest pain, palpitations, claudication and leg swelling.  Gastrointestinal: Negative.   Neurological: Negative.   Endo/Heme/Allergies: Negative.   Psychiatric/Behavioral: Negative.     Objective:  BP (!) 146/92 (BP Location: Right Arm, Patient Position: Sitting, Cuff Size: Normal)   Pulse 63   Temp 99.5 F (37.5 C) (Oral)   Resp 18   Wt 188 lb (85.3 kg)   SpO2 95%   BMI 29.44 kg/m   Physical Exam  Constitutional: She is oriented to person, place, and time and well-developed, well-nourished, and in no distress.  HENT:  Head: Normocephalic and atraumatic.  Right Ear: External ear normal.  Left Ear: External ear normal.  Nose: Nose normal.  Mouth/Throat: Oropharynx is clear and moist.  Eyes: Pupils are equal, round, and reactive to light. Conjunctivae and EOM are normal.  Neck: Normal range of motion. Neck supple.  Cardiovascular: Normal rate, regular rhythm, normal heart sounds and intact distal pulses.  Pulmonary/Chest: Effort normal and breath sounds normal.  Abdominal: Soft. Bowel sounds are  normal.  Musculoskeletal: Normal range of motion.  Neurological: She is alert and oriented to person, place, and time. Gait normal. GCS score is 15.  Skin: Skin is warm and dry.  Psychiatric: Mood, memory, affect and judgment normal.    Assessment and Plan :  1. Sinusitis, unspecified chronicity, unspecified location  - doxycycline (VIBRA-TABS) 100 MG tablet; Take 1 tablet (100 mg total) by mouth 2 (two) times daily.  Dispense: 14 tablet; Refill: 0 - predniSONE (STERAPRED UNI-PAK 21 TAB) 5 MG (21) TBPK tablet; 6 day taper, begin with 6 pills then decrease by 1 each day.  Dispense: 21 tablet; Refill: 0  2. Cough No chest x-ray today.  Xopenex open her up a little bit.  No further work-up as long as she does not worsen.  I have done the exam and reviewed the chart and it is accurate to the best of my knowledge. Development worker, community has been used and  any errors in dictation or transcription are unintentional. Miguel Aschoff M.D. [facility]

## 2019-01-23 NOTE — Telephone Encounter (Signed)
done

## 2019-01-23 NOTE — Telephone Encounter (Signed)
Pharmacy called saying they do not understand the prednisone directions that were sent over today .  Please advise  [phone]  Thanks Con Memos

## 2019-02-01 DIAGNOSIS — F331 Major depressive disorder, recurrent, moderate: Secondary | ICD-10-CM | POA: Diagnosis not present

## 2019-02-01 DIAGNOSIS — F5105 Insomnia due to other mental disorder: Secondary | ICD-10-CM | POA: Diagnosis not present

## 2019-02-08 ENCOUNTER — Other Ambulatory Visit: Payer: Self-pay | Admitting: Family Medicine

## 2019-02-08 DIAGNOSIS — S32110S Nondisplaced Zone I fracture of sacrum, sequela: Secondary | ICD-10-CM

## 2019-02-08 DIAGNOSIS — M810 Age-related osteoporosis without current pathological fracture: Secondary | ICD-10-CM

## 2019-02-13 ENCOUNTER — Ambulatory Visit (INDEPENDENT_AMBULATORY_CARE_PROVIDER_SITE_OTHER): Payer: Medicare Other | Admitting: Family Medicine

## 2019-02-13 ENCOUNTER — Encounter: Payer: Self-pay | Admitting: Family Medicine

## 2019-02-13 VITALS — BP 117/74 | HR 67 | Temp 97.4°F | Resp 16 | Wt 187.0 lb

## 2019-02-13 DIAGNOSIS — F411 Generalized anxiety disorder: Secondary | ICD-10-CM | POA: Diagnosis not present

## 2019-02-13 DIAGNOSIS — G250 Essential tremor: Secondary | ICD-10-CM | POA: Diagnosis not present

## 2019-02-13 DIAGNOSIS — I10 Essential (primary) hypertension: Secondary | ICD-10-CM | POA: Diagnosis not present

## 2019-02-13 DIAGNOSIS — F3341 Major depressive disorder, recurrent, in partial remission: Secondary | ICD-10-CM

## 2019-02-13 MED ORDER — PROPRANOLOL HCL 20 MG PO TABS: 20.0000 mg | ORAL_TABLET | Freq: Two times a day (BID) | ORAL | 3 refills | Status: DC

## 2019-02-13 MED ORDER — PROPRANOLOL HCL 20 MG PO TABS: 20.0000 mg | ORAL_TABLET | Freq: Three times a day (TID) | ORAL | 3 refills | Status: DC

## 2019-02-13 NOTE — Progress Notes (Signed)
[name] Female    DOB: 04-23-1946   73 y.o.   MRN: [id number] Visit Date: 02/13/2019  Today's Provider: Wilhemena Durie, MD   Chief Complaint  Patient presents with  . Hypertension   Subjective:     HPI   Hypertension, follow-up:  BP Readings from Last 3 Encounters:  02/13/19 117/74  01/23/19 (!) 146/92  10/10/18 118/64    She was last seen for hypertension 6 months ago.  BP at that visit was 118/64. Management changes since that visit include patient reports that PA from Neurology had increase Propanolol to 80mg  XR. She reports good compliance with treatment. She is having side effects. Patient reports since medication dose was increased she has noticed symptoms of shortness of breath for the past 2-3 weeks. Patient states that she read on medication insert that if you are taking using inhaler that medication is not recommended. Patient has concerns and would like to address today.  She is not exercising. She is adherent to low salt diet.   Outside blood pressures recent high 157/92. She is experiencing dyspnea and fatigue.  Patient denies dyspnea and fatigue.   Cardiovascular risk factors include advanced age (older than 30 for men, 69 for women) and hypertension.  Use of agents associated with hypertension: none.     Weight trend: stable Wt Readings from Last 3 Encounters:  02/13/19 187 lb (84.8 kg)  01/23/19 188 lb (85.3 kg)  10/10/18 189 lb (85.7 kg)    Current diet: in general, a "healthy" diet    ------------------------------------------------------------------------   Allergies  Allergen Reactions  . Anti-Inflammatory Enzyme [Nutritional Supplements] Other (See Comments)    REACTION: Due to bleeding ulcer  . Cymbalta [Duloxetine Hcl] Other (See Comments)    REACTION: Urinary retention  . Clarithromycin Other (See Comments)    Upsets stomach  . Nsaids     GI BLEED  . Ranitidine     severe diarrhea  . Venlafaxine     severe depression     Current Outpatient Medications:  .  albuterol (PROVENTIL HFA;VENTOLIN HFA) 108 (90 Base) MCG/ACT inhaler, Inhale 1-2 puffs into the lungs every 6 (six) hours as needed for wheezing or shortness of breath., Disp: 3 Inhaler, Rfl: 3 .  alendronate (FOSAMAX) 70 MG tablet, TAKE 1 TABLET BY MOUTH EVERY 7 DAYS. TAKE WITH A FULL GLASS OF WATER ON AN EMPTY STOMACH, Disp: 4 tablet, Rfl: 11 .  ARIPiprazole (ABILIFY) 5 MG tablet, Take 5 mg by mouth daily., Disp: , Rfl:  .  busPIRone (BUSPAR) 10 MG tablet, Take 10 mg by mouth 2 (two) times daily. , Disp: , Rfl:  .  Calcium Carbonate-Vitamin D (CALCIUM 600+D) 600-200 MG-UNIT TABS, Take 1 tablet by mouth daily., Disp: , Rfl:  .  citalopram (CELEXA) 40 MG tablet, 40 mg daily. , Disp: , Rfl:  .  donepezil (ARICEPT) 5 MG tablet, Take 5 mg by mouth at bedtime., Disp: , Rfl:  .  fluticasone (FLONASE) 50 MCG/ACT nasal spray, Place 2 sprays into both nostrils. , Disp: , Rfl:  .  Fluticasone-Salmeterol (ADVAIR DISKUS) 250-50 MCG/DOSE AEPB, Inhale 1 puff into the lungs 2 (two) times daily., Disp: 180 each, Rfl: 3 .  propranolol ER (INDERAL LA) 80 MG 24 hr capsule, Take by mouth., Disp: , Rfl:  .  rosuvastatin (CRESTOR) 10 MG tablet, Take 1 tablet (10 mg total) by mouth daily., Disp: 90 tablet, Rfl: 3 .  traZODone (DESYREL) 100 MG tablet,  Take 250 mg by mouth at bedtime. , Disp: , Rfl:  .  vitamin B-12 (CYANOCOBALAMIN) 1000 MCG tablet, Take 1,000 mcg by mouth daily., Disp: , Rfl:   Review of Systems  Constitutional: Negative.   HENT: Negative.   Eyes: Negative.   Respiratory: Negative.   Cardiovascular: Negative.   Gastrointestinal: Negative.   Endocrine: Negative.   Musculoskeletal: Negative.   Skin: Negative.   Allergic/Immunologic: Negative.   Neurological: Negative.   Psychiatric/Behavioral: Negative.   All other systems reviewed and are negative.   Social History   Tobacco Use  . Smoking status: Never Smoker  . Smokeless  tobacco: Never Used  Substance Use Topics  . Alcohol use: Yes    Alcohol/week: 0.0 standard drinks    Comment: drinks 3-4 times a year      Objective:   BP 117/74   Pulse 67   Temp (!) 97.4 F (36.3 C) (Oral)   Resp 16   Wt 187 lb (84.8 kg)   BMI 29.29 kg/m  Vitals:   02/13/19 1440  BP: 117/74  Pulse: 67  Resp: 16  Temp: (!) 97.4 F (36.3 C)  TempSrc: Oral  Weight: 187 lb (84.8 kg)     Physical Exam Vitals signs reviewed.  Constitutional:      Appearance: She is well-developed.  HENT:     Head: Normocephalic and atraumatic.  Eyes:     General: No scleral icterus.    Conjunctiva/sclera: Conjunctivae normal.  Neck:     Thyroid: No thyromegaly.  Cardiovascular:     Rate and Rhythm: Normal rate and regular rhythm.     Heart sounds: Normal heart sounds.  Pulmonary:     Effort: Pulmonary effort is normal.     Breath sounds: Normal breath sounds.  Abdominal:     Palpations: Abdomen is soft.  Skin:    General: Skin is warm and dry.  Neurological:     Mental Status: She is alert and oriented to person, place, and time.     Comments: Mild tremor noted.  Psychiatric:        Behavior: Behavior normal.        Thought Content: Thought content normal.        Judgment: Judgment normal.         Assessment & Plan     1. Essential hypertension Decrease propanolol  dose to 20 mg twice daily - propranolol (INDERAL) 20 MG tablet; Take 1 tablet (20 mg total) by mouth 2 (two) times daily.  Dispense: 180 tablet; Refill: 3  2. Benign essential tremor After discussion with Dr. Manuella Ghazi will restart primidone.  She has follow-up with him regarding the tremor primidone helped in the propanolol did not.  3. Anxiety, generalized Chronic.  4. Depression, major, recurrent, in partial remission ([facility]) Partial remission per Dr. Nicolasa Ducking from psychiatry.    Patient seen and examined by Dr. Miguel Aschoff, note scribed by Jennings [employer], NCMA I have done the exam and reviewed  the above chart and it is accurate to the best of my knowledge. Development worker, community has been used in this note in any air is in the dictation or transcription are unintentional.  Wilhemena Durie, MD  [place]

## 2019-02-14 ENCOUNTER — Other Ambulatory Visit: Payer: Self-pay

## 2019-02-14 NOTE — Progress Notes (Signed)
Patient advised to restart the Primidone

## 2019-02-18 ENCOUNTER — Telehealth: Payer: Self-pay | Admitting: Family Medicine

## 2019-02-18 NOTE — Telephone Encounter (Signed)
This was sent in on 02/13/19 for BID

## 2019-02-18 NOTE — Telephone Encounter (Signed)
Propanolol 20 mg. Was sent  in for 1 time a day and its suppose to be taken 2 times a day.  Please call [employer] mail order and have the directions changed so she can get the correct amount of pills.

## 2019-02-28 ENCOUNTER — Other Ambulatory Visit: Payer: Self-pay

## 2019-02-28 ENCOUNTER — Ambulatory Visit (INDEPENDENT_AMBULATORY_CARE_PROVIDER_SITE_OTHER): Payer: Medicare Other

## 2019-02-28 VITALS — BP 92/60 | HR 76 | Temp 98.4°F | Ht 67.0 in | Wt 190.0 lb

## 2019-02-28 DIAGNOSIS — Z0000 Encounter for general adult medical examination without abnormal findings: Secondary | ICD-10-CM

## 2019-02-28 DIAGNOSIS — I10 Essential (primary) hypertension: Secondary | ICD-10-CM | POA: Diagnosis not present

## 2019-02-28 DIAGNOSIS — D509 Iron deficiency anemia, unspecified: Secondary | ICD-10-CM | POA: Diagnosis not present

## 2019-02-28 DIAGNOSIS — E538 Deficiency of other specified B group vitamins: Secondary | ICD-10-CM

## 2019-02-28 NOTE — Patient Instructions (Addendum)
Nancy Blackburn , Thank you for taking time to come for your Medicare Wellness Visit. I appreciate your ongoing commitment to your health goals. Please review the following plan we discussed and let me know if I can assist you in the future.   Screening recommendations/referrals: Colonoscopy: Up to date, due 02/2023 Mammogram: Up to date Bone Density: Up to date, due 01/2023 Recommended yearly ophthalmology/optometry visit for glaucoma screening and checkup Recommended yearly dental visit for hygiene and checkup  Vaccinations: Influenza vaccine: Up to date Pneumococcal vaccine: Completed series  Tdap vaccine: Up to date Shingles vaccine: Pt declines today.     Advanced directives: Currently on file.   Conditions/risks identified: Continue to increase water intake to 6-8 8 oz glasses of water a day and start walking 3 times a week for at least 30 minutes at a time.   Next appointment: 04/10/19 @ 2:00 PM with Dr Rosanna Randy. Declined scheduling AWV for 2021 at this time.    Preventive Care 14 Years and Older, Female Preventive care refers to lifestyle choices and visits with your health care provider that can promote health and wellness. What does preventive care include?  A yearly physical exam. This is also called an annual well check.  Dental exams once or twice a year.  Routine eye exams. Ask your health care provider how often you should have your eyes checked.  Personal lifestyle choices, including:  Daily care of your teeth and gums.  Regular physical activity.  Eating a healthy diet.  Avoiding tobacco and drug use.  Limiting alcohol use.  Practicing safe sex.  Taking low-dose aspirin every day.  Taking vitamin and mineral supplements as recommended by your health care provider. What happens during an annual well check? The services and screenings done by your health care provider during your annual well check will depend on your age, overall health, lifestyle risk  factors, and family history of disease. Counseling  Your health care provider may ask you questions about your:  Alcohol use.  Tobacco use.  Drug use.  Emotional well-being.  Home and relationship well-being.  Sexual activity.  Eating habits.  History of falls.  Memory and ability to understand (cognition).  Work and work Statistician.  Reproductive health. Screening  You may have the following tests or measurements:  Height, weight, and BMI.  Blood pressure.  Lipid and cholesterol levels. These may be checked every 5 years, or more frequently if you are over 63 years old.  Skin check.  Lung cancer screening. You may have this screening every year starting at age 57 if you have a 30-pack-year history of smoking and currently smoke or have quit within the past 15 years.  Fecal occult blood test (FOBT) of the stool. You may have this test every year starting at age 18.  Flexible sigmoidoscopy or colonoscopy. You may have a sigmoidoscopy every 5 years or a colonoscopy every 10 years starting at age 85.  Hepatitis C blood test.  Hepatitis B blood test.  Sexually transmitted disease (STD) testing.  Diabetes screening. This is done by checking your blood sugar (glucose) after you have not eaten for a while (fasting). You may have this done every 1-3 years.  Bone density scan. This is done to screen for osteoporosis. You may have this done starting at age 60.  Mammogram. This may be done every 1-2 years. Talk to your health care provider about how often you should have regular mammograms. Talk with your health care provider about your test  results, treatment options, and if necessary, the need for more tests. Vaccines  Your health care provider may recommend certain vaccines, such as:  Influenza vaccine. This is recommended every year.  Tetanus, diphtheria, and acellular pertussis (Tdap, Td) vaccine. You may need a Td booster every 10 years.  Zoster vaccine. You  may need this after age 83.  Pneumococcal 13-valent conjugate (PCV13) vaccine. One dose is recommended after age 37.  Pneumococcal polysaccharide (PPSV23) vaccine. One dose is recommended after age 78. Talk to your health care provider about which screenings and vaccines you need and how often you need them. This information is not intended to replace advice given to you by your health care provider. Make sure you discuss any questions you have with your health care provider. Document Released: 12/25/2015 Document Revised: 08/17/2016 Document Reviewed: 09/29/2015 Elsevier Interactive Patient Education  2017 [place] Prevention in the Home Falls can cause injuries. They can happen to people of all ages. There are many things you can do to make your home safe and to help prevent falls. What can I do on the outside of my home?  Regularly fix the edges of walkways and driveways and fix any cracks.  Remove anything that might make you trip as you walk through a door, such as a raised step or threshold.  Trim any bushes or trees on the path to your home.  Use bright outdoor lighting.  Clear any walking paths of anything that might make someone trip, such as rocks or tools.  Regularly check to see if handrails are loose or broken. Make sure that both sides of any steps have handrails.  Any raised decks and porches should have guardrails on the edges.  Have any leaves, snow, or ice cleared regularly.  Use sand or salt on walking paths during winter.  Clean up any spills in your garage right away. This includes oil or grease spills. What can I do in the bathroom?  Use night lights.  Install grab bars by the toilet and in the tub and shower. Do not use towel bars as grab bars.  Use non-skid mats or decals in the tub or shower.  If you need to sit down in the shower, use a plastic, non-slip stool.  Keep the floor dry. Clean up any water that spills on the floor as soon as  it happens.  Remove soap buildup in the tub or shower regularly.  Attach bath mats securely with double-sided non-slip rug tape.  Do not have throw rugs and other things on the floor that can make you trip. What can I do in the bedroom?  Use night lights.  Make sure that you have a light by your bed that is easy to reach.  Do not use any sheets or blankets that are too big for your bed. They should not hang down onto the floor.  Have a firm chair that has side arms. You can use this for support while you get dressed.  Do not have throw rugs and other things on the floor that can make you trip. What can I do in the kitchen?  Clean up any spills right away.  Avoid walking on wet floors.  Keep items that you use a lot in easy-to-reach places.  If you need to reach something above you, use a strong step stool that has a grab bar.  Keep electrical cords out of the way.  Do not use floor polish or wax that makes  floors slippery. If you must use wax, use non-skid floor wax.  Do not have throw rugs and other things on the floor that can make you trip. What can I do with my stairs?  Do not leave any items on the stairs.  Make sure that there are handrails on both sides of the stairs and use them. Fix handrails that are broken or loose. Make sure that handrails are as long as the stairways.  Check any carpeting to make sure that it is firmly attached to the stairs. Fix any carpet that is loose or worn.  Avoid having throw rugs at the top or bottom of the stairs. If you do have throw rugs, attach them to the floor with carpet tape.  Make sure that you have a light switch at the top of the stairs and the bottom of the stairs. If you do not have them, ask someone to add them for you. What else can I do to help prevent falls?  Wear shoes that:  Do not have high heels.  Have rubber bottoms.  Are comfortable and fit you well.  Are closed at the toe. Do not wear sandals.  If  you use a stepladder:  Make sure that it is fully opened. Do not climb a closed stepladder.  Make sure that both sides of the stepladder are locked into place.  Ask someone to hold it for you, if possible.  Clearly mark and make sure that you can see:  Any grab bars or handrails.  First and last steps.  Where the edge of each step is.  Use tools that help you move around (mobility aids) if they are needed. These include:  Canes.  Walkers.  Scooters.  Crutches.  Turn on the lights when you go into a dark area. Replace any light bulbs as soon as they burn out.  Set up your furniture so you have a clear path. Avoid moving your furniture around.  If any of your floors are uneven, fix them.  If there are any pets around you, be aware of where they are.  Review your medicines with your doctor. Some medicines can make you feel dizzy. This can increase your chance of falling. Ask your doctor what other things that you can do to help prevent falls. This information is not intended to replace advice given to you by your health care provider. Make sure you discuss any questions you have with your health care provider. Document Released: 09/24/2009 Document Revised: 05/05/2016 Document Reviewed: 01/02/2015 Elsevier Interactive Patient Education  2017 [employer].

## 2019-02-28 NOTE — Progress Notes (Signed)
Subjective:   Nancy Blackburn is a 73 y.o. female who presents for Medicare Annual (Subsequent) preventive examination.  Review of Systems:  N/A  Cardiac Risk Factors include: advanced age (>42men, >92 women);hypertension;dyslipidemia     Objective:     Vitals: BP 92/60 (BP Location: Left Arm)   Pulse 76   Temp 98.4 F (36.9 C) (Oral)   Ht 5\' 7"  (1.702 m)   Wt 190 lb (86.2 kg)   BMI 29.76 kg/m   Body mass index is 29.76 kg/m.  Advanced Directives 02/28/2019 01/18/2018 03/13/2017 01/04/2017 02/01/2016 11/24/2011 11/11/2011  Does Patient Have a Medical Advance Directive? Yes Yes Yes Yes Yes Patient has advance directive, copy in chart Patient has advance directive, copy in chart  Type of Advance Directive [place];Living will [place];Living will Healthcare Power of [place];Living will [place];Living will Living will;Healthcare Power of Attorney Living will;Healthcare Power of [place] in Chart? Yes - validated most recent copy scanned in chart (See row information) Yes - Yes - - -  Pre-existing out of facility DNR order (yellow form or pink MOST form) - - - - - No No    Tobacco Social History   Tobacco Use  Smoking Status Never Smoker  Smokeless Tobacco Never Used     Counseling given: Not Answered   Clinical Intake:  Pre-visit preparation completed: Yes  Pain : 0-10 Pain Score: 1  Pain Location: Back Pain Orientation: Lower Pain Descriptors / Indicators: Aching Pain Frequency: Constant     Nutritional Status: BMI 25 -29 Overweight Nutritional Risks: None Diabetes: No  How often do you need to have someone help you when you read instructions, pamphlets, or other written materials from your doctor or pharmacy?: 1 - Never  Interpreter Needed?: No  Information entered by :: [facility], LPN  Past Medical History:  Diagnosis Date   . Chronic sinus infection   . Ejection fraction    a. EF 55-65%, echo, mild LVH, June, 2013, small pericardial effusion  . Fracture, sacrum/coccyx ([place])   . Hyperlipidemia    a. takes Simvastatin  . Hypertension    a. takes Metoprolol, Lisinopril, and amlodipine.  . Low back pain   . Mild dementia ([place])   . Nasal polyps   . Normal nuclear stress test    a. 07/2005 Nl nuc stress test.  . Osteopenia   . Osteoporosis   . Palpitations    a. June 2013:  Event recorder normal sinus rhythm with rare PVCs - ? symptomatic PVC's.  . Parkinson disease ([place])   . Pericardial effusion    a. Small, echo, June, 2013  . PONV (postoperative nausea and vomiting)    Past Surgical History:  Procedure Laterality Date  . BACK SURGERY  08/2010  . BLADDER SUSPENSION  2003  . bone spur  2009   removed from right collar bone area and partial collar bone removed  . THYROIDECTOMY, PARTIAL    . TONSILLECTOMY     as child  . TUBAL LIGATION     90yrs ago  . VAGINAL HYSTERECTOMY     20yrs ago d/t cervical cancer   Family History  Problem Relation Age of Onset  . Colon cancer Mother   . Heart murmur Mother   . Heart disease Mother   . Angina Mother   . Heart disease Father   . COPD Father   . Atrial fibrillation  Brother   . Cushing syndrome Daughter   . Cancer Daughter        thyroid  . Breast cancer Cousin   . Hypotension Neg Hx   . Malignant hyperthermia Neg Hx   . Pseudochol deficiency Neg Hx    Social History   Socioeconomic History  . Marital status: Divorced    Spouse name: Not on file  . Number of children: 2  . Years of education: Not on file  . Highest education level: 12th grade  Occupational History  . Occupation: retired  Scientific laboratory technician  . Financial resource strain: Not hard at all  . Food insecurity:    Worry: Never true    Inability: Never true  . Transportation needs:    Medical: No    Non-medical: No  Tobacco Use  . Smoking status: Never Smoker  . Smokeless  tobacco: Never Used  Substance and Sexual Activity  . Alcohol use: Yes    Alcohol/week: 0.0 standard drinks    Comment: drinks 3-4 times a year  . Drug use: No  . Sexual activity: Never  Lifestyle  . Physical activity:    Days per week: 0 days    Minutes per session: 0 min  . Stress: Not at all  Relationships  . Social connections:    Talks on phone: Patient refused    Gets together: Patient refused    Attends religious service: Patient refused    Active member of club or organization: Patient refused    Attends meetings of clubs or organizations: Patient refused    Relationship status: Patient refused  Other Topics Concern  . Not on file  Social History Narrative  . Not on file    Outpatient Encounter Medications as of 02/28/2019  Medication Sig  . albuterol (PROVENTIL HFA;VENTOLIN HFA) 108 (90 Base) MCG/ACT inhaler Inhale 1-2 puffs into the lungs every 6 (six) hours as needed for wheezing or shortness of breath.  Marland Kitchen alendronate (FOSAMAX) 70 MG tablet TAKE 1 TABLET BY MOUTH EVERY 7 DAYS. TAKE WITH A FULL GLASS OF WATER ON AN EMPTY STOMACH  . ARIPiprazole (ABILIFY) 5 MG tablet Take 5 mg by mouth daily.  . busPIRone (BUSPAR) 10 MG tablet Take 10 mg by mouth 2 (two) times daily.   . Calcium Carbonate-Vitamin D (CALCIUM 600+D) 600-200 MG-UNIT TABS Take 1 tablet by mouth daily.  . citalopram (CELEXA) 40 MG tablet 40 mg daily.   Marland Kitchen donepezil (ARICEPT) 5 MG tablet Take 5 mg by mouth at bedtime.  . Fluticasone-Salmeterol (ADVAIR DISKUS) 250-50 MCG/DOSE AEPB Inhale 1 puff into the lungs 2 (two) times daily.  . primidone (MYSOLINE) 50 MG tablet Take 50 mg by mouth at bedtime.  . propranolol (INDERAL) 20 MG tablet Take 1 tablet (20 mg total) by mouth 2 (two) times daily.  . rosuvastatin (CRESTOR) 10 MG tablet Take 1 tablet (10 mg total) by mouth daily. (Patient taking differently: Take 10 mg by mouth daily. Takes EOD)  . traZODone (DESYREL) 100 MG tablet Take 250 mg by mouth at bedtime.    . vitamin B-12 (CYANOCOBALAMIN) 1000 MCG tablet Take 1,000 mcg by mouth daily.  . fluticasone (FLONASE) 50 MCG/ACT nasal spray Place 2 sprays into both nostrils.    No facility-administered encounter medications on file as of 02/28/2019.     Activities of Daily Living In your present state of health, do you have any difficulty performing the following activities: 02/28/2019  Hearing? Y  Comment Does not wear hearing aids.  Vision? N  Difficulty concentrating or making decisions? Y  Comment On medication for memory loss.   Walking or climbing stairs? Y  Comment Due to SOB.   Dressing or bathing? N  Doing errands, shopping? N  Preparing Food and eating ? N  Using the Toilet? N  In the past six months, have you accidently leaked urine? Y  Comment Occasionally, currently has an ecoin neuromodulation implant.  Do you have problems with loss of bowel control? Y  Comment Occasionally due to diarrhea. Pt is scheduled to see a GI next week.   Managing your Medications? N  Managing your Finances? N  Housekeeping or managing your Housekeeping? N  Some recent data might be hidden    Patient Care Team: Jerrol Banana., MD as PCP - General (Unknown Physician Specialty) Marlyn Corporal, Clearnce Sorrel, PA-C as Physician Assistant (Family Medicine) Birder Robson, MD as Referring Physician (Ophthalmology) Vladimir Crofts, MD as Consulting Physician (Neurology) Wilhelmina Mcardle, MD as Consulting Physician (Pulmonary Disease) Bjorn Loser, MD as Consulting Physician (Urology) Newman Pies, MD as Consulting Physician (Neurosurgery) Chauncey Mann, MD as Referring Physician (Psychiatry) Richmond Campbell, MD as Consulting Physician (Gastroenterology)    Assessment:   This is a routine wellness examination for [place].  Exercise Activities and Dietary recommendations Current Exercise Habits: The patient does not participate in regular exercise at present, Exercise limited by: orthopedic  condition(s)  Goals    . DIET - INCREASE WATER INTAKE     Recommend increasing water intake to 4-6 glasses a day.     . Exercise      Starting this year, I will start walking 2-3 days a week for 20 minutes.        Fall Risk: Fall Risk  02/28/2019 01/18/2018 01/04/2017 08/31/2015  Falls in the past year? 0 No No No    FALL RISK PREVENTION PERTAINING TO THE HOME:  Any stairs in or around the home? Yes  If so, are there any without handrails? Yes   Home free of loose throw rugs in walkways, pet beds, electrical cords, etc? No  Adequate lighting in your home to reduce risk of falls? No   ASSISTIVE DEVICES UTILIZED TO PREVENT FALLS:  Life alert? No  Use of a cane, walker or w/c? No  Grab bars in the bathroom? Yes Shower chair or bench in shower? No  Elevated toilet seat or a handicapped toilet? Yes    TIMED UP AND GO:  Was the test performed? No .    Depression Screen PHQ 2/9 Scores 02/28/2019 01/18/2018 08/16/2017 01/04/2017  PHQ - 2 Score 0 3 0 0  PHQ- 9 Score - 7 3 -     Cognitive Function MMSE - Mini Mental State Exam 08/01/2016  Orientation to time 5  Orientation to Place 5  Registration 3  Attention/ Calculation 5  Recall 2  Language- name 2 objects 2  Language- repeat 1  Language- follow 3 step command 3  Language- read & follow direction 1  Write a sentence 1  Copy design 1  Total score 29     6CIT Screen 02/28/2019 01/04/2017  What Year? 0 points 0 points  What month? 0 points 0 points  What time? 0 points 3 points  Count back from 20 0 points 0 points  Months in reverse 0 points 0 points  Repeat phrase 0 points 0 points  Total Score 0 3    Immunization History  Administered Date(s) Administered  . Influenza  Inj Mdck Quad Pf 10/27/2016  . Influenza Split 10/05/2011, 10/10/2012  . Influenza, High Dose Seasonal PF 09/04/2014, 09/01/2015, 10/05/2017, 10/10/2018  . Influenza-Unspecified 10/12/2013, 08/12/2014, 09/04/2014  . Pneumococcal Conjugate-13  03/26/2015  . Pneumococcal Polysaccharide-23 10/11/1999, 04/10/2012  . Td 08/23/1999  . Zoster 09/26/2008    Qualifies for Shingles Vaccine? Yes  Zostavax completed 09/26/08. Due for Shingrix. Education has been provided regarding the importance of this vaccine. Pt has been advised to call insurance company to determine out of pocket expense. Advised may also receive vaccine at local pharmacy or Health Dept. Verbalized acceptance and understanding.  Tdap: Up to date  Flu Vaccine: Up to date  Pneumococcal Vaccine: Up to date  Screening Tests Health Maintenance  Topic Date Due  . TETANUS/TDAP  12/13/2019  . MAMMOGRAM  10/08/2020  . DEXA SCAN  02/07/2023  . COLONOSCOPY  02/23/2023  . INFLUENZA VACCINE  Completed  . Hepatitis C Screening  Completed  . PNA vac Low Risk Adult  Completed    Cancer Screenings:  Colorectal Screening: Completed 02/22/18. Repeat every 5 years.  Mammogram: Completed 10/08/18.  Bone Density: Completed 02/07/18. Results reflect OSTEOPENIA. Repeat every 5 years.   Lung Cancer Screening: (Low Dose CT Chest recommended if Age 30-80 years, 30 pack-year currently smoking OR have quit w/in 15years.) does not qualify.   Additional Screening:  Hepatitis C Screening: Up to date  Vision Screening: Recommended annual ophthalmology exams for early detection of glaucoma and other disorders of the eye.  Dental Screening: Recommended annual dental exams for proper oral hygiene  Community Resource Referral:  CRR required this visit?  No       Plan:  I have personally reviewed and addressed the Medicare Annual Wellness questionnaire and have noted the following in the patient's chart:  A. Medical and social history B. Use of alcohol, tobacco or illicit drugs  C. Current medications and supplements D. Functional ability and status E.  Nutritional status F.  Physical activity G. Advance directives H. List of other physicians I.  Hospitalizations, surgeries,  and ER visits in previous 12 months J.  [place] such as hearing and vision if needed, cognitive and depression L. Referrals and appointments   In addition, I have reviewed and discussed with patient certain preventive protocols, quality metrics, and best practice recommendations. A written personalized care plan for preventive services as well as general preventive health recommendations were provided to patient. Nurse Health Advisor  Signed,    Nikola Blackston [place], [place]  [id number] Nurse Health Advisor   Nurse Notes: CBC and CMP ordered today per PCP due to low BP readings. Advised pt to decrease Propanolol from 20 mg BID to 20 mg daily through the weekend and to stop on Monday. Pt advised to CB with any concerns or questions. Pt stated understanding and also said she would check her BP and record the readings.

## 2019-03-01 ENCOUNTER — Telehealth: Payer: Self-pay

## 2019-03-01 LAB — COMPREHENSIVE METABOLIC PANEL
ALT: 13 IU/L (ref 0–32)
AST: 17 IU/L (ref 0–40)
Albumin/Globulin Ratio: 2.1 (ref 1.2–2.2)
Albumin: 3.8 g/dL (ref 3.7–4.7)
Alkaline Phosphatase: 83 IU/L (ref 39–117)
BUN/Creatinine Ratio: 10 — ABNORMAL LOW (ref 12–28)
BUN: 11 mg/dL (ref 8–27)
Bilirubin Total: 0.3 mg/dL (ref 0.0–1.2)
CO2: 25 mmol/L (ref 20–29)
Calcium: 9.2 mg/dL (ref 8.7–10.3)
Chloride: 101 mmol/L (ref 96–106)
Creatinine, Ser: 1.12 mg/dL — ABNORMAL HIGH (ref 0.57–1.00)
GFR calc Af Amer: 56 mL/min/{1.73_m2} — ABNORMAL LOW (ref >59)
GFR calc non Af Amer: 49 mL/min/{1.73_m2} — ABNORMAL LOW (ref >59)
Globulin, Total: 1.8 g/dL (ref 1.5–4.5)
Glucose: 96 mg/dL (ref 65–99)
Potassium: 4.3 mmol/L (ref 3.5–5.2)
Sodium: 139 mmol/L (ref 134–144)
Total Protein: 5.6 g/dL — ABNORMAL LOW (ref 6.0–8.5)

## 2019-03-01 LAB — CBC WITH DIFFERENTIAL/PLATELET
Basophils Absolute: 0.1 10*3/uL (ref 0.0–0.2)
Basos: 1 %
EOS (ABSOLUTE): 0.1 10*3/uL (ref 0.0–0.4)
Eos: 1 %
Hematocrit: 42.2 % (ref 34.0–46.6)
Hemoglobin: 13.8 g/dL (ref 11.1–15.9)
Immature Grans (Abs): 0 10*3/uL (ref 0.0–0.1)
Immature Granulocytes: 0 %
Lymphocytes Absolute: 1.7 10*3/uL (ref 0.7–3.1)
Lymphs: 20 %
MCH: 30.1 pg (ref 26.6–33.0)
MCHC: 32.7 g/dL (ref 31.5–35.7)
MCV: 92 fL (ref 79–97)
Monocytes Absolute: 1.2 10*3/uL — ABNORMAL HIGH (ref 0.1–0.9)
Monocytes: 15 %
Neutrophils Absolute: 5.1 10*3/uL (ref 1.4–7.0)
Neutrophils: 63 %
Platelets: 295 10*3/uL (ref 150–450)
RBC: 4.58 x10E6/uL (ref 3.77–5.28)
RDW: 12 % (ref 11.7–15.4)
WBC: 8.1 10*3/uL (ref 3.4–10.8)

## 2019-03-01 NOTE — Telephone Encounter (Signed)
-----   Message from Jerrol Banana., MD sent at 03/01/2019  8:20 AM EDT ----- Labs okay.  Push fluids a little.

## 2019-03-01 NOTE — Telephone Encounter (Signed)
Pt advised.   Thanks,   -Ersel Enslin  

## 2019-03-04 DIAGNOSIS — F5105 Insomnia due to other mental disorder: Secondary | ICD-10-CM | POA: Diagnosis not present

## 2019-03-04 DIAGNOSIS — F331 Major depressive disorder, recurrent, moderate: Secondary | ICD-10-CM | POA: Diagnosis not present

## 2019-03-11 ENCOUNTER — Telehealth: Payer: Self-pay

## 2019-03-11 DIAGNOSIS — R197 Diarrhea, unspecified: Secondary | ICD-10-CM | POA: Diagnosis not present

## 2019-03-11 DIAGNOSIS — K2970 Gastritis, unspecified, without bleeding: Secondary | ICD-10-CM | POA: Diagnosis not present

## 2019-03-11 NOTE — Telephone Encounter (Signed)
Patient called stating that her blood pressure was low, 90's over 50's and that she felt a little different.  She said she couldn't describe how her chest felt but it felt 'different'.  She specifically denied any; chest pain, shortness of breath, feeling like her heart was beating irregular.  She said that her HR was in the 80's and it is normally in the 60's.  When asked if she was dizzy, or got dizzy when she stood up she said no she wasn't dizzy and that she hadn't tried to get up.   The patient was advised that she should probably be seen.  She said her grandson would not get home until 5:30.  If she wasn't better by then or if she got any worse she would go to be seen.  She is to call us in the morning and let us know if she was seen by anyone.  I told her if she wasn't seen by anyone that we would see her in the morning that she probably needed to at least have an EKG.

## 2019-03-12 ENCOUNTER — Encounter: Payer: Self-pay | Admitting: Family Medicine

## 2019-03-12 ENCOUNTER — Other Ambulatory Visit: Payer: Self-pay

## 2019-03-12 ENCOUNTER — Ambulatory Visit (INDEPENDENT_AMBULATORY_CARE_PROVIDER_SITE_OTHER): Payer: Medicare Other | Admitting: Family Medicine

## 2019-03-12 VITALS — BP 138/98 | HR 104 | Temp 97.5°F | Ht 67.0 in | Wt 190.2 lb

## 2019-03-12 DIAGNOSIS — G3184 Mild cognitive impairment, so stated: Secondary | ICD-10-CM

## 2019-03-12 DIAGNOSIS — F411 Generalized anxiety disorder: Secondary | ICD-10-CM | POA: Diagnosis not present

## 2019-03-12 DIAGNOSIS — F3341 Major depressive disorder, recurrent, in partial remission: Secondary | ICD-10-CM

## 2019-03-12 DIAGNOSIS — G250 Essential tremor: Secondary | ICD-10-CM | POA: Diagnosis not present

## 2019-03-12 DIAGNOSIS — I10 Essential (primary) hypertension: Secondary | ICD-10-CM

## 2019-03-12 DIAGNOSIS — R079 Chest pain, unspecified: Secondary | ICD-10-CM

## 2019-03-12 MED ORDER — FLUTICASONE PROPIONATE 50 MCG/ACT NA SUSP: 2.0000 | Freq: Every day | NASAL | 4 refills | Status: DC

## 2019-03-12 NOTE — Telephone Encounter (Signed)
Would Nancy Blackburn  like to be seen this morning.  I can see her at 9 or 1140.  Otherwise I can call her and talk to her later.

## 2019-03-12 NOTE — Telephone Encounter (Signed)
Appt scheduled

## 2019-03-12 NOTE — Progress Notes (Signed)
[name] Female    DOB: 10/03/1946   73 y.o.   MRN: [id number] Visit Date: 03/12/2019  Today's Provider: Wilhemena Durie, MD   Chief Complaint  Patient presents with  . Hypotension   Subjective:     HPI  Pt reports she is being seen for low blood pressure. She had mild nonspecific chest tightness yesterday. No associated symptoms. It lasted on a moment. Yesterday 03/11/19 bp was 90/45.  Pt was advised over the phone to drink plenty of fluids and if no better to go to ER.  Pt does advise that her bp did go up some after drinking fluid to 124/77.  Pt has been having some swelling in her right foot.  It is worse in the evenings.this morning pt's reports her Bp was 144/98.  She didn't take any bp medication last night. The swelling started about 3 weeks ago 02/19/19  but refused to make appt. She has had no CP,cough,dyspnea,syncope or presyncope. No Covid 19 exposure,no fevers.  Allergies  Allergen Reactions  . Anti-Inflammatory Enzyme [Nutritional Supplements] Other (See Comments)    REACTION: Due to bleeding ulcer  . Cymbalta [Duloxetine Hcl] Other (See Comments)    REACTION: Urinary retention  . Clarithromycin Other (See Comments)    Upsets stomach  . Nsaids     GI BLEED  . Ranitidine     severe diarrhea  . Venlafaxine     severe depression     Current Outpatient Medications:  .  albuterol (PROVENTIL HFA;VENTOLIN HFA) 108 (90 Base) MCG/ACT inhaler, Inhale 1-2 puffs into the lungs every 6 (six) hours as needed for wheezing or shortness of breath., Disp: 3 Inhaler, Rfl: 3 .  alendronate (FOSAMAX) 70 MG tablet, TAKE 1 TABLET BY MOUTH EVERY 7 DAYS. TAKE WITH A FULL GLASS OF WATER ON AN EMPTY STOMACH, Disp: 4 tablet, Rfl: 11 .  ARIPiprazole (ABILIFY) 5 MG tablet, Take 5 mg by mouth daily., Disp: , Rfl:  .  busPIRone (BUSPAR) 10 MG tablet, Take 10 mg by mouth 2 (two) times daily. , Disp: , Rfl:  .  Calcium Carbonate-Vitamin D (CALCIUM 600+D) 600-200  MG-UNIT TABS, Take 1 tablet by mouth daily., Disp: , Rfl:  .  citalopram (CELEXA) 40 MG tablet, 40 mg daily. , Disp: , Rfl:  .  donepezil (ARICEPT) 5 MG tablet, Take 5 mg by mouth at bedtime., Disp: , Rfl:  .  fluticasone (FLONASE) 50 MCG/ACT nasal spray, Place 2 sprays into both nostrils. , Disp: , Rfl:  .  Fluticasone-Salmeterol (ADVAIR DISKUS) 250-50 MCG/DOSE AEPB, Inhale 1 puff into the lungs 2 (two) times daily., Disp: 180 each, Rfl: 3 .  omeprazole (PRILOSEC) 40 MG capsule, Take 40 mg by mouth daily., Disp: , Rfl:  .  primidone (MYSOLINE) 50 MG tablet, Take 50 mg by mouth at bedtime., Disp: , Rfl:  .  propranolol (INDERAL) 20 MG tablet, Take 1 tablet (20 mg total) by mouth 2 (two) times daily., Disp: 180 tablet, Rfl: 3 .  rosuvastatin (CRESTOR) 10 MG tablet, Take 1 tablet (10 mg total) by mouth daily. (Patient taking differently: Take 10 mg by mouth daily. Takes EOD), Disp: 90 tablet, Rfl: 3 .  traZODone (DESYREL) 100 MG tablet, Take 250 mg by mouth at bedtime. , Disp: , Rfl:  .  vitamin B-12 (CYANOCOBALAMIN) 1000 MCG tablet, Take 1,000 mcg by mouth daily., Disp: , Rfl:   Review of Systems  Constitutional: Negative.   HENT: Negative.  Eyes: Negative.   Respiratory: Negative.   Cardiovascular: Negative.   Gastrointestinal: Negative.   Endocrine: Negative.   Genitourinary: Negative.   Musculoskeletal: Negative.   Skin: Negative.   Allergic/Immunologic: Negative.   Neurological: Negative.   Hematological: Negative.   Psychiatric/Behavioral: Negative.     Social History   Tobacco Use  . Smoking status: Never Smoker  . Smokeless tobacco: Never Used  Substance Use Topics  . Alcohol use: Yes    Alcohol/week: 0.0 standard drinks    Comment: drinks 3-4 times a year      Objective:   BP (!) 138/98 (BP Location: Right Arm, Patient Position: Sitting, Cuff Size: Normal)   Pulse (!) 104   Temp (!) 97.5 F (36.4 C) (Oral)   Ht 5\' 7"  (1.702 m)   Wt 190 lb 3.2 oz (86.3 kg)    SpO2 97%   BMI 29.79 kg/m  Vitals:   03/12/19 1121  BP: (!) 138/98  Pulse: (!) 104  Temp: (!) 97.5 F (36.4 C)  TempSrc: Oral  SpO2: 97%  Weight: 190 lb 3.2 oz (86.3 kg)  Height: 5\' 7"  (1.702 m)     Physical Exam Vitals signs reviewed.  Constitutional:      Appearance: She is well-developed.  HENT:     Head: Normocephalic and atraumatic.  Eyes:     General: No scleral icterus.    Conjunctiva/sclera: Conjunctivae normal.  Neck:     Thyroid: No thyromegaly.  Cardiovascular:     Rate and Rhythm: Normal rate and regular rhythm.     Heart sounds: Normal heart sounds.  Pulmonary:     Effort: Pulmonary effort is normal.     Breath sounds: Normal breath sounds.  Abdominal:     Palpations: Abdomen is soft.  Skin:    General: Skin is warm and dry.  Neurological:     Mental Status: She is alert and oriented to person, place, and time. Mental status is at baseline.  Psychiatric:        Behavior: Behavior normal.        Thought Content: Thought content normal.        Judgment: Judgment normal.    ECG reveals low voltage across precordiu. No STTWCs since last ECG in 2015.     Assessment & Plan    1. Chest pain, unspecified type Non cardiac --most likely anxiety by description. - EKG 12-Lead  2. Essential hypertension Fair control. For hypotension will have pt push fluids.  3. Mild cognitive impairment with memory loss   4. Benign essential tremor Improved today  5. Anxiety, generalized   6. Depression, major, recurrent, in partial remission ([place]) Per Dr Nicolasa Ducking.     Jensine Luz Cranford Mon, MD  [facility]

## 2019-03-21 DIAGNOSIS — R197 Diarrhea, unspecified: Secondary | ICD-10-CM | POA: Diagnosis not present

## 2019-03-28 DIAGNOSIS — R790 Abnormal level of blood mineral: Secondary | ICD-10-CM | POA: Diagnosis not present

## 2019-04-01 DIAGNOSIS — F331 Major depressive disorder, recurrent, moderate: Secondary | ICD-10-CM | POA: Diagnosis not present

## 2019-04-01 DIAGNOSIS — G250 Essential tremor: Secondary | ICD-10-CM | POA: Diagnosis not present

## 2019-04-01 DIAGNOSIS — F5105 Insomnia due to other mental disorder: Secondary | ICD-10-CM | POA: Diagnosis not present

## 2019-04-02 ENCOUNTER — Ambulatory Visit: Payer: Self-pay | Admitting: *Deleted

## 2019-04-02 NOTE — Chronic Care Management (AMB) (Signed)
  Chronic Care Management   Note  04/02/2019 Name: Nancy Blackburn MRN: [id number] DOB: 1946-04-18  [name] was given information about Chronic Care Management services today including:  1. CCM service includes personalized support from designated clinical staff supervised by her physician, including individualized plan of care and coordination with other care providers 2. 24/7 contact phone numbers for assistance for urgent and routine care needs. 3. Service will only be billed when office clinical staff spend 20 minutes or more in a month to coordinate care. 4. Only one practitioner may furnish and bill the service in a calendar month. 5. The patient may stop CCM services at any time (effective at the end of the month) by phone call to the office staff. 6. The patient will be responsible for cost sharing (co-pay) of up to 20% of the service fee (after annual deductible is met).  Patient agreed to services and verbal consent obtained.    Follow up plan: Telephone follow up appointment with CCM team member scheduled for: 04/09/19  [facility] Care Management [phone]

## 2019-04-08 ENCOUNTER — Ambulatory Visit: Payer: Medicare Other

## 2019-04-08 ENCOUNTER — Other Ambulatory Visit: Payer: Self-pay

## 2019-04-08 NOTE — Chronic Care Management (AMB) (Signed)
  Chronic Care Management   Note  04/08/2019 Name: Nancy Blackburn MRN: [id number] DOB: 1946-05-19  Care Coordination: Successful telephone call as previously arranged during this mornings encounter with patient. She is very much interested in engaging with CCM RN CM and an initial telephone assessment with goal setting has been scheduled for Thursday 04/18/2019 at 1:00.  Ziah Turvey E. Rollene Rotunda, RN, BSN Nurse Care Coordinator [facility] Care Management [phone]

## 2019-04-08 NOTE — Patient Instructions (Signed)
1. Thank You for allowing the CCM (Chronic Care Management) Team to assist you with your healthcare goals!! I look forward to speaking with you on 04/18/2019 at 1:00. 2. Please bring ALL medications to your appointment! If you have a blood sugar meter or a blood pressure monitor at home, bring those as well.  3.  Contact the CCM Team if you have any question or need to reschedule your initial visit.  CCM (Chronic Care Management) Team   Trish Fountain RN, BSN Nurse Care Coordinator  [phone]  Ruben Reason PharmD  Clinical Pharmacist  [phone]   Elliot Gurney, LCSW Clinical Social Worker [phone]  Ms. Street was given information about Chronic Care Management services today including:  1. CCM service includes personalized support from designated clinical staff supervised by her physician, including individualized plan of care and coordination with other care providers 2. 24/7 contact phone numbers for assistance for urgent and routine care needs. 3. Service will only be billed when office clinical staff spend 20 minutes or more in a month to coordinate care. 4. Only one practitioner may furnish and bill the service in a calendar month. 5. The patient may stop CCM services at any time (effective at the end of the month) by phone call to the office staff. 6. The patient will be responsible for cost sharing (co-pay) of up to 20% of the service fee (after annual deductible is met).  Patient agreed to services and verbal consent obtained.

## 2019-04-08 NOTE — Chronic Care Management (AMB) (Signed)
  Chronic Care Management   Note  04/08/2019 Name: Nancy Blackburn MRN: [id number] DOB: 1946-09-25  Care Coordination: Successful telephone outreach to [employer]. 38, 73 year old female patient who sees Dr. Miguel Aschoff for primary care. [name] was referred to CCM services by her insurance provider for chronic care management. [name] consented to services and scheduled for initial engagement by CCM RN CM today. Unfortunately [name] states this is not a good time to speak as she is having a significant amount of tremors. This is not a new condition for her and she declines need for medical intervention of PCP notification. States she has a physical scheduled for this coming Wednesday and will speak to Dr. Rosanna Randy during that encounter.  [name] request call back later today to discuss services provided by [facility] Team. Will attempt to schedule initial intake assessment at that time.  [name] E. Rollene Rotunda, RN, BSN Nurse Care Coordinator [facility] Care Management [phone]

## 2019-04-10 ENCOUNTER — Other Ambulatory Visit: Payer: Self-pay

## 2019-04-10 ENCOUNTER — Ambulatory Visit (INDEPENDENT_AMBULATORY_CARE_PROVIDER_SITE_OTHER): Payer: Medicare Other | Admitting: Family Medicine

## 2019-04-10 ENCOUNTER — Encounter: Payer: Self-pay | Admitting: Family Medicine

## 2019-04-10 VITALS — BP 124/82 | HR 61 | Temp 98.2°F | Ht 67.0 in | Wt 192.0 lb

## 2019-04-10 DIAGNOSIS — E7800 Pure hypercholesterolemia, unspecified: Secondary | ICD-10-CM | POA: Diagnosis not present

## 2019-04-10 DIAGNOSIS — E538 Deficiency of other specified B group vitamins: Secondary | ICD-10-CM | POA: Diagnosis not present

## 2019-04-10 DIAGNOSIS — E785 Hyperlipidemia, unspecified: Secondary | ICD-10-CM

## 2019-04-10 DIAGNOSIS — F3341 Major depressive disorder, recurrent, in partial remission: Secondary | ICD-10-CM

## 2019-04-10 DIAGNOSIS — I10 Essential (primary) hypertension: Secondary | ICD-10-CM | POA: Diagnosis not present

## 2019-04-10 NOTE — Progress Notes (Signed)
[name], Female    DOB: 1946/06/27, 73 y.o.   MRN: [id number] Visit Date: 04/10/2019  Today's Provider: Wilhemena Durie, MD   Chief Complaint  Patient presents with  . Annual Exam  . Leg Swelling    feet and ankles   Subjective:     Annual physical exam Nancy Blackburn is a 73 y.o. female who presents today for health maintenance and complete physical. She feels fairly well. She reports exercising not at this time. She reports she is sleeping fairly well. Overall she is stable and feeling fairly fairly well with her depression despite coronavirus pandemic.  -----------------------------------------------------------------   Review of Systems  Constitutional: Negative.   HENT: Negative.   Eyes: Negative.   Respiratory: Negative.   Cardiovascular: Positive for leg swelling (feet and ankles).  Gastrointestinal: Positive for diarrhea (seeing GI for this).  Endocrine: Negative.   Genitourinary: Negative.   Musculoskeletal: Negative.   Skin: Negative.   Allergic/Immunologic: Negative.   Neurological: Positive for tremors and weakness.  Hematological: Negative.   Psychiatric/Behavioral: Negative.     Social History      She  reports that she has never smoked. She has never used smokeless tobacco. She reports current alcohol use. She reports that she does not use drugs.       Social History   Socioeconomic History  . Marital status: Divorced    Spouse name: Not on file  . Number of children: 2  . Years of education: Not on file  . Highest education level: 12th grade  Occupational History  . Occupation: retired  Scientific laboratory technician  . Financial resource strain: Not hard at all  . Food insecurity:    Worry: Never true    Inability: Never true  . Transportation needs:    Medical: No    Non-medical: No  Tobacco Use  . Smoking status: Never Smoker  . Smokeless tobacco: Never Used  Substance and Sexual Activity  . Alcohol use: Yes   Alcohol/week: 0.0 standard drinks    Comment: drinks 3-4 times a year  . Drug use: No  . Sexual activity: Never  Lifestyle  . Physical activity:    Days per week: 0 days    Minutes per session: 0 min  . Stress: Not at all  Relationships  . Social connections:    Talks on phone: Patient refused    Gets together: Patient refused    Attends religious service: Patient refused    Active member of club or organization: Patient refused    Attends meetings of clubs or organizations: Patient refused    Relationship status: Patient refused  Other Topics Concern  . Not on file  Social History Narrative  . Not on file    Past Medical History:  Diagnosis Date  . Chronic sinus infection   . Ejection fraction    a. EF 55-65%, echo, mild LVH, June, 2013, small pericardial effusion  . Fracture, sacrum/coccyx ([place])   . Hyperlipidemia    a. takes Simvastatin  . Hypertension    a. takes Metoprolol, Lisinopril, and amlodipine.  . Low back pain   . Mild dementia ([place])   . Nasal polyps   . Normal nuclear stress test    a. 07/2005 Nl nuc stress test.  . Osteopenia   . Osteoporosis   . Palpitations    a. June 2013:  Event recorder normal sinus rhythm with rare PVCs - ? symptomatic PVC's.  . Parkinson  disease ([place])   . Pericardial effusion    a. Small, echo, June, 2013  . PONV (postoperative nausea and vomiting)      Patient Active Problem List   Diagnosis Date Noted  . Cervical spondylosis 05/04/2017  . Cervical radiculopathy 05/04/2017  . Family history of Cushing disease 01/12/2017  . B12 deficiency 12/18/2016  . Mild cognitive impairment with memory loss 12/18/2016  . Parkinsonian features 12/18/2016  . Vitamin D deficiency 12/18/2016  . Chalazion, bilateral 06/08/2016  . Absolute anemia 07/30/2015  . Edema extremities 07/30/2015  . Benign essential tremor 07/30/2015  . Anxiety, generalized 07/30/2015  . Gastro-esophageal reflux disease without esophagitis 07/30/2015  . HLD  (hyperlipidemia) 07/30/2015  . Cannot sleep 07/30/2015  . Lumbar radiculopathy 07/30/2015  . Depression, major, recurrent, in partial remission ([place]) 07/30/2015  . Arthritis, degenerative 07/30/2015  . Allergic rhinitis 07/30/2015  . Gastroduodenal ulcer 07/30/2015  . Calcium blood increased 07/30/2015  . Pre-syncope 05/17/2015  . Dyspnea 03/31/2015  . Essential hypertension 03/31/2015  . Bleeding stomach ulcer 10/14/2013  . Anemia, iron deficiency 10/14/2013  . Weakness generalized 10/14/2013  . Ejection fraction   . Pericardial effusion   . Palpitations   . Low back pain   . Normal nuclear stress test     Past Surgical History:  Procedure Laterality Date  . BACK SURGERY  08/2010  . BLADDER SUSPENSION  2003  . bone spur  2009   removed from right collar bone area and partial collar bone removed  . THYROIDECTOMY, PARTIAL    . TONSILLECTOMY     as child  . TUBAL LIGATION     61yrs ago  . VAGINAL HYSTERECTOMY     25yrs ago d/t cervical cancer    Family History        Family Status  Relation Name Status  . Mother  Alive  . Father  Deceased  . Brother  Alive  . Daughter  Alive  . Cousin  (Not Specified)  . Neg Hx  (Not Specified)        Her family history includes Angina in her mother; Atrial fibrillation in her brother; Breast cancer in her cousin; COPD in her father; Cancer in her daughter; Colon cancer in her mother; Cushing syndrome in her daughter; Heart disease in her father and mother; Heart murmur in her mother. There is no history of Hypotension, Malignant hyperthermia, or Pseudochol deficiency.      Allergies  Allergen Reactions  . Anti-Inflammatory Enzyme [Nutritional Supplements] Other (See Comments)    REACTION: Due to bleeding ulcer  . Cymbalta [Duloxetine Hcl] Other (See Comments)    REACTION: Urinary retention  . Clarithromycin Other (See Comments)    Upsets stomach  . Nsaids     GI BLEED  . Ranitidine     severe diarrhea  . Venlafaxine      severe depression     Current Outpatient Medications:  .  albuterol (PROVENTIL HFA;VENTOLIN HFA) 108 (90 Base) MCG/ACT inhaler, Inhale 1-2 puffs into the lungs every 6 (six) hours as needed for wheezing or shortness of breath., Disp: 3 Inhaler, Rfl: 3 .  alendronate (FOSAMAX) 70 MG tablet, TAKE 1 TABLET BY MOUTH EVERY 7 DAYS. TAKE WITH A FULL GLASS OF WATER ON AN EMPTY STOMACH, Disp: 4 tablet, Rfl: 11 .  ARIPiprazole (ABILIFY) 5 MG tablet, Take 5 mg by mouth daily., Disp: , Rfl:  .  busPIRone (BUSPAR) 10 MG tablet, Take 10 mg by mouth 2 (two) times daily. , Disp: ,  Rfl:  .  Calcium Carbonate-Vitamin D (CALCIUM 600+D) 600-200 MG-UNIT TABS, Take 1 tablet by mouth daily., Disp: , Rfl:  .  citalopram (CELEXA) 40 MG tablet, 40 mg daily. , Disp: , Rfl:  .  donepezil (ARICEPT) 5 MG tablet, Take 5 mg by mouth at bedtime., Disp: , Rfl:  .  fluticasone (FLONASE) 50 MCG/ACT nasal spray, Place 2 sprays into both nostrils daily., Disp: 48 g, Rfl: 4 .  Fluticasone-Salmeterol (ADVAIR DISKUS) 250-50 MCG/DOSE AEPB, Inhale 1 puff into the lungs 2 (two) times daily., Disp: 180 each, Rfl: 3 .  omeprazole (PRILOSEC) 40 MG capsule, Take 40 mg by mouth daily., Disp: , Rfl:  .  primidone (MYSOLINE) 50 MG tablet, Take 50 mg by mouth at bedtime., Disp: , Rfl:  .  propranolol (INDERAL) 20 MG tablet, Take 1 tablet (20 mg total) by mouth 2 (two) times daily., Disp: 180 tablet, Rfl: 3 .  rosuvastatin (CRESTOR) 10 MG tablet, Take 1 tablet (10 mg total) by mouth daily. (Patient taking differently: Take 10 mg by mouth daily. Takes EOD), Disp: 90 tablet, Rfl: 3 .  traZODone (DESYREL) 100 MG tablet, Take 250 mg by mouth at bedtime. , Disp: , Rfl:  .  vitamin B-12 (CYANOCOBALAMIN) 1000 MCG tablet, Take 1,000 mcg by mouth daily., Disp: , Rfl:    Patient Care Team: Jerrol Banana., MD as PCP - General (Unknown Physician Specialty) Mar Daring, PA-C as Physician Assistant (Family Medicine) Birder Robson, MD as  Referring Physician (Ophthalmology) Vladimir Crofts, MD as Consulting Physician (Neurology) Wilhelmina Mcardle, MD as Consulting Physician (Pulmonary Disease) Bjorn Loser, MD as Consulting Physician (Urology) Newman Pies, MD as Consulting Physician (Neurosurgery) Chauncey Mann, MD as Referring Physician (Psychiatry) Richmond Campbell, MD as Consulting Physician (Gastroenterology) Benedetto Goad, RN as Case Manager    Objective:    Vitals: BP 124/82 (BP Location: Right Arm, Patient Position: Sitting, Cuff Size: Normal)   Pulse 61   Temp 98.2 F (36.8 C) (Oral)   Ht 5\' 7"  (1.702 m)   Wt 192 lb (87.1 kg)   SpO2 95%   BMI 30.07 kg/m    Vitals:   04/10/19 1403  BP: 124/82  Pulse: 61  Temp: 98.2 F (36.8 C)  TempSrc: Oral  SpO2: 95%  Weight: 192 lb (87.1 kg)  Height: 5\' 7"  (1.702 m)     Physical Exam Vitals signs reviewed.  Constitutional:      Appearance: She is well-developed.  HENT:     Head: Normocephalic and atraumatic.     Right Ear: External ear normal.     Left Ear: External ear normal.     Nose: Nose normal.  Eyes:     General: No scleral icterus.    Conjunctiva/sclera: Conjunctivae normal.  Neck:     Thyroid: No thyromegaly.  Cardiovascular:     Rate and Rhythm: Normal rate and regular rhythm.     Heart sounds: Normal heart sounds.  Pulmonary:     Effort: Pulmonary effort is normal.     Breath sounds: Normal breath sounds.  Chest:     Breasts: Breasts are symmetrical.        Right: Normal.        Left: Normal.  Abdominal:     Palpations: Abdomen is soft.  Skin:    General: Skin is warm and dry.  Neurological:     General: No focal deficit present.     Mental Status: She is alert and oriented  to person, place, and time. Mental status is at baseline.     Comments: Masked facies.  Psychiatric:        Behavior: Behavior normal.        Thought Content: Thought content normal.        Judgment: Judgment normal.      Depression Screen PHQ  2/9 Scores 04/10/2019 04/10/2019 03/12/2019 02/28/2019  PHQ - 2 Score 1 1 0 0  PHQ- 9 Score 3 3 - -       Assessment & Plan:     Routine Health Maintenance   Exercise Activities and Dietary recommendations Goals    . DIET - INCREASE WATER INTAKE     Recommend increasing water intake to 4-6 glasses a day.     . Exercise      Starting this year, I will start walking 2-3 days a week for 20 minutes.        Immunization History  Administered Date(s) Administered  . Influenza Inj Mdck Quad Pf 10/27/2016  . Influenza Split 10/05/2011, 10/10/2012  . Influenza, High Dose Seasonal PF 09/04/2014, 09/01/2015, 10/05/2017, 10/10/2018  . Influenza-Unspecified 10/12/2013, 08/12/2014, 09/04/2014  . Pneumococcal Conjugate-13 03/26/2015  . Pneumococcal Polysaccharide-23 10/11/1999, 04/10/2012  . Td 08/23/1999  . Zoster 09/26/2008    Health Maintenance  Topic Date Due  . INFLUENZA VACCINE  07/13/2019  . TETANUS/TDAP  12/13/2019  . MAMMOGRAM  10/08/2020  . DEXA SCAN  02/07/2023  . COLONOSCOPY  02/23/2023  . Hepatitis C Screening  Completed  . PNA vac Low Risk Adult  Completed     Discussed health benefits of physical activity, and encouraged her to engage in regular exercise appropriate for her age and condition.  1. B12 deficiency  - Vitamin B12  2. Depression, major, recurrent, in partial remission ([place]) Per Dr Nicolasa Ducking.In partial remission.  3. Essential hypertension Controlled  4. Pure hypercholesterolemia On Crestor. - TSH  5. Hyperlipidemia, unspecified hyperlipidemia type  - Lipid panel    --------------------------------------------------------------------    Wilhemena Durie, MD  [facility]

## 2019-04-12 DIAGNOSIS — E7800 Pure hypercholesterolemia, unspecified: Secondary | ICD-10-CM | POA: Diagnosis not present

## 2019-04-12 DIAGNOSIS — E538 Deficiency of other specified B group vitamins: Secondary | ICD-10-CM | POA: Diagnosis not present

## 2019-04-12 DIAGNOSIS — E785 Hyperlipidemia, unspecified: Secondary | ICD-10-CM | POA: Diagnosis not present

## 2019-04-13 LAB — LIPID PANEL
Chol/HDL Ratio: 3.4 ratio (ref 0.0–4.4)
Cholesterol, Total: 200 mg/dL — ABNORMAL HIGH (ref 100–199)
HDL: 59 mg/dL (ref >39)
LDL Calculated: 110 mg/dL — ABNORMAL HIGH (ref 0–99)
Triglycerides: 154 mg/dL — ABNORMAL HIGH (ref 0–149)
VLDL Cholesterol Cal: 31 mg/dL (ref 5–40)

## 2019-04-13 LAB — VITAMIN B12: Vitamin B-12: 1121 pg/mL (ref 232–1245)

## 2019-04-13 LAB — TSH: TSH: 1.73 u[IU]/mL (ref 0.450–4.500)

## 2019-04-15 ENCOUNTER — Telehealth: Payer: Self-pay

## 2019-04-15 NOTE — Telephone Encounter (Signed)
Patient notified of results.

## 2019-04-15 NOTE — Telephone Encounter (Signed)
-----   Message from Jerrol Banana., MD sent at 04/15/2019 10:37 AM EDT ----- Lipids up a little--work on diet and exercise.

## 2019-04-18 ENCOUNTER — Ambulatory Visit (INDEPENDENT_AMBULATORY_CARE_PROVIDER_SITE_OTHER): Payer: Medicare Other

## 2019-04-18 ENCOUNTER — Other Ambulatory Visit: Payer: Self-pay

## 2019-04-18 DIAGNOSIS — E7800 Pure hypercholesterolemia, unspecified: Secondary | ICD-10-CM | POA: Diagnosis not present

## 2019-04-18 DIAGNOSIS — I10 Essential (primary) hypertension: Secondary | ICD-10-CM

## 2019-04-18 NOTE — Chronic Care Management (AMB) (Signed)
Chronic Care Management   Initial Visit Note  04/19/2019 Name: Nancy Blackburn MRN: [id number] DOB: [birth year]  Subjective: "I know I need to get my cholesterol down but I can't exercise until I get my knees injected"  Objective:  Lab Results  Component Value Date   CHOL 200 (H) 04/12/2019   HDL 59 04/12/2019   LDLCALC 110 (H) 04/12/2019   TRIG 154 (H) 04/12/2019   CHOLHDL 3.4 04/12/2019    Assessment: Nancy Blackburn. 25, 73 year old female patient who sees Dr. Miguel Aschoff for primary care. Nancy Blackburn was referred to CCM services by her health plan for chronic care management. Nancy Blackburn has a history of but not limited to HTN, GERD, Major Depression, and HDL.  Review of patient status, including review of consultants reports, relevant laboratory and other test results, and collaboration with appropriate care team members and the patient's provider was performed as part of comprehensive patient evaluation and provision of chronic care management services.     0 ED visit/ 0 Hospitalizations in last 6 months    Goals Addressed            This Visit's Progress   . I really need to get my cholesterol down (pt-stated)       Nancy. Blackburn is extremely engaged during her initial assessment. She is very concerned about her rising cholesterol levels and wishes to decrease her levels through lifestyle modifications as to not resume cholesterol medication daily. Currently she takes every other day. She made this modification on her on as she was concerned about side effects including myalgia. She has informed Dr. Rosanna Randy of her medication regimen and states he to is concerned that she may have to return to daily use if she does not make modifications in her lifestyle.   Current Barriers:  Marland Kitchen Knowledge Deficits related to understanding hypercholesterolemia and self care . Non-adherence to prescribed medication regimen  Nurse Case Manager Clinical Goal(s):  Marland Kitchen Over  the next 14 days, patient will verbalize understanding of plan for reducing cholesterol through lifestyle modifications and medication adherence . Over the next 14 days, patient will review ALL education materials provided by mail and be prepared to discuss during next telephone visit.  Interventions:  . Provided education to patient re: complications of untreated hyperlipidemia including stroke and heart attack . Reviewed medications with patient and discussed possible need for increasing cholesterol medication regimin bake to daily dosing . Discussed plans with patient for ongoing care management follow up and provided patient with direct contact information for care management team . Provided patient with written educational materials related to diagnosis of hyperlipidemia, lifestyle modifications including exercise, diet, and medication adherence, s/s of stroke and heart attack and when to seek medical care  Patient Self Care Activities:  . Self administers medications as prescribed . Attends all scheduled provider appointments . Calls pharmacy for medication refills . Calls provider office for new concerns or questions  . Follows lipid lowering diet and exercise as tolerated  Initial goal documentation         Follow up plan:  Telephone follow up appointment with CCM team member scheduled for: 2 weeks   Nancy Blackburn was given information about Chronic Care Management services today including:  1. CCM service includes personalized support from designated clinical staff supervised by her physician, including individualized plan of care and coordination with other care providers 2. 24/7 contact phone numbers for assistance for urgent and routine care needs. 3. Service will only  be billed when office clinical staff spend 20 minutes or more in a month to coordinate care. 4. Only one practitioner may furnish and bill the service in a calendar month. 5. The patient may stop CCM  services at any time (effective at the end of the month) by phone call to the office staff. 6. The patient will be responsible for cost sharing (co-pay) of up to 20% of the service fee (after annual deductible is met).  Patient agreed to services and verbal consent obtained.  Marcene Laskowski E. Rollene Rotunda, RN, BSN Nurse Care Coordinator [facility] [phone]

## 2019-04-18 NOTE — Patient Instructions (Signed)
Thank you allowing the Chronic Care Management Team to be a part of your care! It was a pleasure speaking with you today!  1. Take all medications as prescribed. I am glad Dr. Rosanna Randy knows you are taking you cholesterol medications every other day and has told you that you may have to go back to daily. It is important that you do not adjust your medications without provider knowledge. 2. Try to exercise as much as you can. Hopefully you will soon be able to have the gel injections in your knees. 3. Pay close attention to the information provided below. If you do not want to have to take your cholesterol medicine daily, you will have to modify your diet AND activity levels.  CCM (Chronic Care Management) Team   Trish Fountain RN, BSN Nurse Care Coordinator  [phone]  Ruben Reason PharmD  Clinical Pharmacist  [phone]   Elliot Gurney, LCSW Clinical Social Worker [phone]  Goals Addressed            This Visit's Progress   . I really need to get my cholesterol down (pt-stated)       Current Barriers:  Nancy Blackburn Knowledge Deficits related to understanding hypercholesterolemia and self care . Non-adherence to prescribed medication regimen  Nurse Case Manager Clinical Goal(s):  Nancy Blackburn Over the next 14 days, patient will verbalize understanding of plan for reducing cholesterol through lifestyle modifications and medication adherence . Over the next 14 days, patient will review ALL education materials provided by mail and be prepared to discuss during next telephone visit.  Interventions:  . Provided education to patient re: complications of untreated hyperlipidemia including stroke and heart attack . Reviewed medications with patient and discussed possible need for increasing cholesterol medication regimin bake to daily dosing . Discussed plans with patient for ongoing care management follow up and provided patient with direct contact information for care management  team . Provided patient with written educational materials related to diagnosis of hyperlipidemia, lifestyle modifications including exercise, diet, and medication adherence, s/s of stroke and heart attack and when to seek medical care  Patient Self Care Activities:  . Self administers medications as prescribed . Attends all scheduled provider appointments . Calls pharmacy for medication refills . Calls provider office for new concerns or questions  . Follows lipid lowering diet and exercise as tolerated  Initial goal documentation        Print copy of patient instructions provided.  via mail  Telephone follow up appointment with CCM team member scheduled for: 2 weeks  SYMPTOMS OF A HEART ATTACK Symptoms of this condition include:  Chest pain. It may feel like: ? Crushing or squeezing. ? Tightness, pressure, fullness, or heaviness.  Pain in the arm, neck, jaw, back, or upper body.  Shortness of breath.  Heartburn.  Indigestion.  Nausea.  Cold sweats.  Feeling tired.  Sudden lightheadedness.    SYMPTOMS OF A STROKE  You have any symptoms of stroke. "BE FAST" is an easy way to remember the main warning signs: ? B - Balance. Signs are dizziness, sudden trouble walking, or loss of balance. ? E - Eyes. Signs are trouble seeing or a sudden change in how you see. ? F - Face. Signs are sudden weakness or loss of feeling of the face, or the face or eyelid drooping on one side. ? A - Arms. Signs are weakness or loss of feeling in an arm. This happens suddenly and usually on one side of the body. ?  S - Speech. Signs are sudden trouble speaking, slurred speech, or trouble understanding what people say. ? T - Time. Time to call emergency services. Write down what time symptoms started.  You have other signs of stroke, such as: ? A sudden, very bad headache with no known cause. ? Feeling sick to your stomach (nausea). ? Throwing up (vomiting). ? Jerky movements you cannot  control (seizure).    High Cholesterol  High cholesterol is a condition in which the blood has high levels of a white, waxy, fat-like substance (cholesterol). The human body needs small amounts of cholesterol. The liver makes all the cholesterol that the body needs. Extra (excess) cholesterol comes from the food that we eat. Cholesterol is carried from the liver by the blood through the blood vessels. If you have high cholesterol, deposits (plaques) may build up on the walls of your blood vessels (arteries). Plaques make the arteries narrower and stiffer. Cholesterol plaques increase your risk for heart attack and stroke. Work with your health care provider to keep your cholesterol levels in a healthy range. What increases the risk? This condition is more likely to develop in people who:  Eat foods that are high in animal fat (saturated fat) or cholesterol.  Are overweight.  Are not getting enough exercise.  Have a family history of high cholesterol. What are the signs or symptoms? There are no symptoms of this condition. How is this diagnosed? This condition may be diagnosed from the results of a blood test.  If you are older than age 91, your health care provider may check your cholesterol every 4-6 years.  You may be checked more often if you already have high cholesterol or other risk factors for heart disease. The blood test for cholesterol measures:  "Bad" cholesterol (LDL cholesterol). This is the main type of cholesterol that causes heart disease. The desired level for LDL is less than 100.   "Good" cholesterol (HDL cholesterol). This type helps to protect against heart disease by cleaning the arteries and carrying the LDL away. The desired level for HDL is 60 or higher.  Triglycerides. These are fats that the body can store or burn for energy. The desired number for triglycerides is lower than 150.  Total cholesterol. This is a measure of the total amount of cholesterol in  your blood, including LDL cholesterol, HDL cholesterol, and triglycerides. A healthy number is less than 200. How is this treated? This condition is treated with diet changes, lifestyle changes, and medicines. Diet changes  This may include eating more whole grains, fruits, vegetables, nuts, and fish.  This may also include cutting back on red meat and foods that have a lot of added sugar. Lifestyle changes  Changes may include getting at least 40 minutes of aerobic exercise 3 times a week. Aerobic exercises include walking, biking, and swimming. Aerobic exercise along with a healthy diet can help you maintain a healthy weight.  Changes may also include quitting smoking. Medicines  Medicines are usually given if diet and lifestyle changes have failed to reduce your cholesterol to healthy levels.  Your health care provider may prescribe a statin medicine. Statin medicines have been shown to reduce cholesterol, which can reduce the risk of heart disease. Follow these instructions at home: Eating and drinking If told by your health care provider:  Eat chicken (without skin), fish, veal, shellfish, ground [place] breast, and round or loin cuts of red meat.  Do not eat fried foods or fatty meats, such as  hot dogs and salami.  Eat plenty of fruits, such as apples.  Eat plenty of vegetables, such as broccoli, potatoes, and carrots.  Eat beans, peas, and lentils.  Eat grains such as barley, rice, couscous, and bulgur wheat.  Eat pasta without cream sauces.  Use skim or nonfat milk, and eat low-fat or nonfat yogurt and cheeses.  Do not eat or drink whole milk, cream, ice cream, egg yolks, or hard cheeses.  Do not eat stick margarine or tub margarines that contain trans fats (also called partially hydrogenated oils).  Do not eat saturated tropical oils, such as coconut oil and palm oil.  Do not eat cakes, cookies, crackers, or other baked goods that contain trans fats.  General  instructions  Exercise as directed by your health care provider. Increase your activity level with activities such as gardening, walking, and taking the stairs.  Take over-the-counter and prescription medicines only as told by your health care provider.  Do not use any products that contain nicotine or tobacco, such as cigarettes and e-cigarettes. If you need help quitting, ask your health care provider.  Keep all follow-up visits as told by your health care provider. This is important. Contact a health care provider if:  You are struggling to maintain a healthy diet or weight.  You need help to start on an exercise program.  You need help to stop smoking. Get help right away if:  You have chest pain.  You have trouble breathing. This information is not intended to replace advice given to you by your health care provider. Make sure you discuss any questions you have with your health care provider. Document Released: 11/28/2005 Document Revised: 06/25/2016 Document Reviewed: 05/28/2016 Elsevier Interactive Patient Education  2019 [place] and Cholesterol Restricted Eating Plan Getting too much fat and cholesterol in your diet may cause health problems. Choosing the right foods helps keep your fat and cholesterol at normal levels. This can keep you from getting certain diseases.  What are tips for following this plan? Meal planning  At meals, divide your plate into four equal parts: ? Fill one-half of your plate with vegetables and green salads. ? Fill one-fourth of your plate with whole grains. ? Fill one-fourth of your plate with low-fat (lean) protein foods.  Eat fish that is high in omega-3 fats at least two times a week. This includes mackerel, tuna, sardines, and salmon.  Eat foods that are high in fiber, such as whole grains, beans, apples, broccoli, carrots, peas, and barley. General tips   Work with your doctor to lose weight if you need  to.  Avoid: ? Foods with added sugar. ? Fried foods. ? Foods with partially hydrogenated oils.  Limit alcohol intake to no more than 1 drink a day for nonpregnant women and 2 drinks a day for men. One drink equals 12 oz of beer, 5 oz of wine, or 1 oz of hard liquor. Reading food labels  Check food labels for: ? Trans fats. ? Partially hydrogenated oils. ? Saturated fat (g) in each serving. ? Cholesterol (mg) in each serving. ? Fiber (g) in each serving.  Choose foods with healthy fats, such as: ? Monounsaturated fats. ? Polyunsaturated fats. ? Omega-3 fats.  Choose grain products that have whole grains. Look for the word "whole" as the first word in the ingredient list. Cooking  Cook foods using low-fat methods. These include baking, boiling, grilling, and broiling.  Eat more home-cooked foods. Eat at restaurants and  buffets less often.  Avoid cooking using saturated fats, such as butter, cream, palm oil, palm kernel oil, and coconut oil. Recommended foods  Fruits  All fresh, canned (in natural juice), or frozen fruits. Vegetables  Fresh or frozen vegetables (raw, steamed, roasted, or grilled). Green salads. Grains  Whole grains, such as whole wheat or whole grain breads, crackers, cereals, and pasta. Unsweetened oatmeal, bulgur, barley, quinoa, or brown rice. Corn or whole wheat flour tortillas. Meats and other protein foods  Ground beef (85% or leaner), grass-fed beef, or beef trimmed of fat. Skinless chicken or [place]. Ground chicken or [place]. Pork trimmed of fat. All fish and seafood. Egg whites. Dried beans, peas, or lentils. Unsalted nuts or seeds. Unsalted canned beans. Nut butters without added sugar or oil. Dairy  Low-fat or nonfat dairy products, such as skim or 1% milk, 2% or reduced-fat cheeses, low-fat and fat-free ricotta or cottage cheese, or plain low-fat and nonfat yogurt. Fats and oils  Tub margarine without trans fats. Light or reduced-fat  mayonnaise and salad dressings. Avocado. Olive, canola, sesame, or safflower oils. The items listed above may not be a complete list of foods and beverages you can eat. Contact a dietitian for more information. Foods to avoid Fruits  Canned fruit in heavy syrup. Fruit in cream or butter sauce. Fried fruit. Vegetables  Vegetables cooked in cheese, cream, or butter sauce. Fried vegetables. Grains  White bread. White pasta. White rice. Cornbread. Bagels, pastries, and croissants. Crackers and snack foods that contain trans fat and hydrogenated oils. Meats and other protein foods  Fatty cuts of meat. Ribs, chicken wings, bacon, sausage, bologna, salami, chitterlings, fatback, hot dogs, bratwurst, and packaged lunch meats. Liver and organ meats. Whole eggs and egg yolks. Chicken and [place] with skin. Fried meat. Dairy  Whole or 2% milk, cream, half-and-half, and cream cheese. Whole milk cheeses. Whole-fat or sweetened yogurt. Full-fat cheeses. Nondairy creamers and whipped toppings. Processed cheese, cheese spreads, and cheese curds. Beverages  Alcohol. Sugar-sweetened drinks such as sodas, lemonade, and fruit drinks. Fats and oils  Butter, stick margarine, lard, shortening, ghee, or bacon fat. Coconut, palm kernel, and palm oils. Sweets and desserts  Corn syrup, sugars, honey, and molasses. Candy. Jam and jelly. Syrup. Sweetened cereals. Cookies, pies, cakes, donuts, muffins, and ice cream. The items listed above may not be a complete list of foods and beverages you should avoid. Contact a dietitian for more information. Summary  Choosing the right foods helps keep your fat and cholesterol at normal levels. This can keep you from getting certain diseases.  At meals, fill one-half of your plate with vegetables and green salads.  Eat high-fiber foods, like whole grains, beans, apples, carrots, peas, and barley.  Limit added sugar, saturated fats, alcohol, and fried foods. This  information is not intended to replace advice given to you by your health care provider. Make sure you discuss any questions you have with your health care provider. Document Released: 05/29/2012 Document Revised: 08/01/2018 Document Reviewed: 08/15/2017 Elsevier Interactive Patient Education  2019 [employer].

## 2019-04-30 DIAGNOSIS — F331 Major depressive disorder, recurrent, moderate: Secondary | ICD-10-CM | POA: Diagnosis not present

## 2019-04-30 DIAGNOSIS — F5105 Insomnia due to other mental disorder: Secondary | ICD-10-CM | POA: Diagnosis not present

## 2019-04-30 DIAGNOSIS — F411 Generalized anxiety disorder: Secondary | ICD-10-CM | POA: Diagnosis not present

## 2019-05-02 ENCOUNTER — Other Ambulatory Visit: Payer: Self-pay

## 2019-05-02 ENCOUNTER — Ambulatory Visit: Payer: Medicare Other

## 2019-05-02 DIAGNOSIS — E7800 Pure hypercholesterolemia, unspecified: Secondary | ICD-10-CM

## 2019-05-02 DIAGNOSIS — I10 Essential (primary) hypertension: Secondary | ICD-10-CM

## 2019-05-02 NOTE — Chronic Care Management (AMB) (Signed)
  Chronic Care Management   Follow Up Note   05/02/2019 Name: Nancy Blackburn MRN: [id number] DOB: 04-May-1946  Referred by: Jerrol Banana., MD Reason for referral : Chronic Care Management (follow up hypercholesterolemia)   Subjective: "I got the information on low cholesterol diet in the mail and find it very helpful"   Objective:  Assessment: Nancy Blackburn. 66, 73 year old female patient who sees Dr. Miguel Aschoff for primary care. Nancy Blackburn was referred to CCM services by her health plan for chronic care management. Nancy Blackburn has a history of but not limited to HTN, GERD, Major Depression, and HDL.   Review of patient status, including review of consultants reports, relevant laboratory and other test results, and collaboration with appropriate care team members and the patient's provider was performed as part of comprehensive patient evaluation and provision of chronic care management services.    Goals Addressed            This Visit's Progress   . I really need to get my cholesterol down (pt-stated)       Nancy Blackburn has received her Scientist, clinical (histocompatibility and immunogenetics) provided to her by RN CM. States she found it very helpful. She is very motivated to lower her cholesterol through lifestyle changes so she does not have to resume taking her statin daily. She plans to call [facility] today for an appointment to see if gel injections in her knees will help. This appointment was previously planned but cancelled related to COVID-19 restrictions.   Current Barriers:  Marland Kitchen Knowledge Deficits related to understanding hypercholesterolemia and self care . Non-adherence to prescribed medication regimen  Nurse Case Manager Clinical Goal(s):  Marland Kitchen Over the next 14 days, patient will verbalize understanding of plan for reducing cholesterol through lifestyle modifications and medication adherence-goal met 05/02/2019 . Over the next 14 days, patient will review ALL education  materials provided by mail and be prepared to discuss during next telephone visit.-goal met 05/02/2019 . Over the next 30 days, patient will adhere to plan of care estalished to reduce cholesterol through lifestyle modifications including daily exercise/activity, cholesterol medication adherence, and reduction in high fat foods.  Interventions:  . Assessed for receipt and review of provided education . Assessed for progression towards goal of diet change and daily activity . Assessed for statin compliance and side effects  Patient Self Care Activities:  . Self administers medications as prescribed . Attends all scheduled provider appointments . Calls pharmacy for medication refills . Calls provider office for new concerns or questions  . Follows lipid lowering diet and exercise as tolerated  Please see past updates related to this goal by clicking on the "Past Updates" button in the selected goal          Telephone follow up appointment with CCM team member scheduled for: 30 days  Zebadiah Willert E. Rollene Rotunda, RN, BSN Nurse Care Coordinator [facility] [phone]

## 2019-05-02 NOTE — Patient Instructions (Addendum)
Thank you allowing the Chronic Care Management Team to be a part of your care! It was a pleasure speaking with you today!  1. Take all medications as prescribed. 2. Please call [facility] today to schedule an appointment with your provider. 3. Try to exercise as much as you can. Hopefully you will soon be able to have the gel injections in your knees. 4. Continue to utilize previously provided educational materials as motivation and guidance for you to improve your diet.   CCM (Chronic Care Management) Team   Trish Fountain RN, BSN Nurse Care Coordinator  [phone]  Ruben Reason PharmD  Clinical Pharmacist  [phone]   Elliot Gurney, LCSW Clinical Social Worker [phone]  Goals Addressed            This Visit's Progress   . I really need to get my cholesterol down (pt-stated)       Current Barriers:  Marland Kitchen Knowledge Deficits related to understanding hypercholesterolemia and self care . Non-adherence to prescribed medication regimen  Nurse Case Manager Clinical Goal(s):  Marland Kitchen Over the next 14 days, patient will verbalize understanding of plan for reducing cholesterol through lifestyle modifications and medication adherence-goal met 05/02/2019 . Over the next 14 days, patient will review ALL education materials provided by mail and be prepared to discuss during next telephone visit.-goal met 05/02/2019 . Over the next 30 days, patient will adhere to plan of care estalished to reduce cholesterol through lifestyle modifications including daily exercise/activity, cholesterol medication adherence, and reduction in high fat foods.  Interventions:  . Assessed for receipt and review of provided education . Assessed for progression towards goal of diet change and daily activity . Assessed for statin compliance and side effects  Patient Self Care Activities:  . Self administers medications as prescribed . Attends all scheduled provider appointments . Calls pharmacy for  medication refills . Calls provider office for new concerns or questions  . Follows lipid lowering diet and exercise as tolerated  Please see past updates related to this goal by clicking on the "Past Updates" button in the selected goal         The patient verbalized understanding of instructions provided today and declined a print copy of patient instruction materials.   Telephone follow up appointment with CCM team member scheduled for: 30 days  SYMPTOMS OF A STROKE   You have any symptoms of stroke. "BE FAST" is an easy way to remember the main warning signs: ? B - Balance. Signs are dizziness, sudden trouble walking, or loss of balance. ? E - Eyes. Signs are trouble seeing or a sudden change in how you see. ? F - Face. Signs are sudden weakness or loss of feeling of the face, or the face or eyelid drooping on one side. ? A - Arms. Signs are weakness or loss of feeling in an arm. This happens suddenly and usually on one side of the body. ? S - Speech. Signs are sudden trouble speaking, slurred speech, or trouble understanding what people say. ? T - Time. Time to call emergency services. Write down what time symptoms started.  You have other signs of stroke, such as: ? A sudden, very bad headache with no known cause. ? Feeling sick to your stomach (nausea). ? Throwing up (vomiting). ? Jerky movements you cannot control (seizure).  SYMPTOMS OF A HEART ATTACK  What are the signs or symptoms? Symptoms of this condition include:  Chest pain. It may feel like: ? Crushing or  squeezing. ? Tightness, pressure, fullness, or heaviness.  Pain in the arm, neck, jaw, back, or upper body.  Shortness of breath.  Heartburn.  Indigestion.  Nausea.  Cold sweats.  Feeling tired.  Sudden lightheadedness.

## 2019-05-10 DIAGNOSIS — M170 Bilateral primary osteoarthritis of knee: Secondary | ICD-10-CM | POA: Diagnosis not present

## 2019-05-13 DIAGNOSIS — K210 Gastro-esophageal reflux disease with esophagitis: Secondary | ICD-10-CM | POA: Diagnosis not present

## 2019-05-13 DIAGNOSIS — R197 Diarrhea, unspecified: Secondary | ICD-10-CM | POA: Diagnosis not present

## 2019-05-17 DIAGNOSIS — M170 Bilateral primary osteoarthritis of knee: Secondary | ICD-10-CM | POA: Diagnosis not present

## 2019-05-27 DIAGNOSIS — M170 Bilateral primary osteoarthritis of knee: Secondary | ICD-10-CM | POA: Diagnosis not present

## 2019-06-04 ENCOUNTER — Other Ambulatory Visit: Payer: Self-pay

## 2019-06-04 ENCOUNTER — Ambulatory Visit (INDEPENDENT_AMBULATORY_CARE_PROVIDER_SITE_OTHER): Payer: Medicare Other

## 2019-06-04 DIAGNOSIS — I10 Essential (primary) hypertension: Secondary | ICD-10-CM

## 2019-06-04 DIAGNOSIS — E7800 Pure hypercholesterolemia, unspecified: Secondary | ICD-10-CM | POA: Diagnosis not present

## 2019-06-04 NOTE — Patient Instructions (Signed)
Thank you allowing the Chronic Care Management Team to be a part of your care! It was a pleasure speaking with you today!  1. I spoke with Dr. Rosanna Randy and he advises against use of Voltaren Gel for your knee discomfort. You may use any other topical pain relief including Bio Freeze. 2. You can also try ice or mild heat for discomfort 3. If your pain does not improve with these over the counter treatments, please contact your orthopedist to discuss 4. Walk as much as your knee will tolerate. I am so happy you got relief with the first two gel injections. Hopefully the soreness from the third will soon disapate. 5. Continue to take your statin as you have been. 6. Please limit the amount of saturated fats including fried foods you eat. Refer back to the information and diet examples I sent you.  CCM (Chronic Care Management) Team   Trish Fountain RN, BSN Nurse Care Coordinator  [phone]  Ruben Reason PharmD  Clinical Pharmacist  [phone]   Elliot Gurney, LCSW Clinical Social Worker [phone]  Goals Addressed            This Visit's Progress   . I really need to get my cholesterol down (pt-stated)       Current Barriers:  Marland Kitchen Knowledge Deficits related to understanding hypercholesterolemia and self care . Non-adherence to prescribed medication regimen  Nurse Case Manager Clinical Goal(s):  Marland Kitchen Over the next 30 days, patient will adhere to plan of care estalished to reduce cholesterol through lifestyle modifications including daily exercise/activity, cholesterol medication adherence, and reduction in high fat foods.  Interventions:  . Assessed for progression towards goal of diet change and daily activity . Assessed for statin compliance and side effects . Discuss recent knee injections and ability to walk without discomfort . Collaborated with PCP for most appropriate topical anesthetic for knee discomfort secondary to third "gel injection"  Patient Self Care  Activities:  . Self administers medications as prescribed . Attends all scheduled provider appointments . Calls pharmacy for medication refills . Calls provider office for new concerns or questions  . Follows lipid lowering diet and exercise as tolerated  Please see past updates related to this goal by clicking on the "Past Updates" button in the selected goal         The patient verbalized understanding of instructions provided today and declined a print copy of patient instruction materials.   Telephone follow up appointment with care management team member scheduled for: 3 weeks  SYMPTOMS OF A STROKE   You have any symptoms of stroke. "BE FAST" is an easy way to remember the main warning signs: ? B - Balance. Signs are dizziness, sudden trouble walking, or loss of balance. ? E - Eyes. Signs are trouble seeing or a sudden change in how you see. ? F - Face. Signs are sudden weakness or loss of feeling of the face, or the face or eyelid drooping on one side. ? A - Arms. Signs are weakness or loss of feeling in an arm. This happens suddenly and usually on one side of the body. ? S - Speech. Signs are sudden trouble speaking, slurred speech, or trouble understanding what people say. ? T - Time. Time to call emergency services. Write down what time symptoms started.  You have other signs of stroke, such as: ? A sudden, very bad headache with no known cause. ? Feeling sick to your stomach (nausea). ? Throwing up (vomiting). ?  Jerky movements you cannot control (seizure).  SYMPTOMS OF A HEART ATTACK  What are the signs or symptoms? Symptoms of this condition include:  Chest pain. It may feel like: ? Crushing or squeezing. ? Tightness, pressure, fullness, or heaviness.  Pain in the arm, neck, jaw, back, or upper body.  Shortness of breath.  Heartburn.  Indigestion.  Nausea.  Cold sweats.  Feeling tired.  Sudden lightheadedness.

## 2019-06-04 NOTE — Chronic Care Management (AMB) (Signed)
°  Chronic Care Management   Follow Up Note   06/04/2019 Name: Nancy Blackburn MRN: [id number] DOB: 13-Sep-1946  Referred by: Nancy Blackburn., MD Reason for referral : Chronic Care Management (follow up HLD)   Subjective: "I have had 3 knee injections with gel and the first two made such a difference but the last one has continue to be sore"   Objective:  Assessment: Nancy Blackburn. 8, 73 year old female patient who sees Nancy Blackburn for primary care. Nancy Blackburn was referred to CCM services by herhealth planfor chronic care management. Ms Blackburn has a history of but not limited to HTN, GERD, Major Depression, and HDL. Today RN Cm followed up with Nancy Blackburn to discuss progression towards health goals.  Review of patient status, including review of consultants reports, relevant laboratory and other test results, and collaboration with appropriate care team members and the patient's provider was performed as part of comprehensive patient evaluation and provision of chronic care management services.    Goals Addressed            This Visit's Progress    I really need to get my cholesterol down (pt-stated)       Nancy Blackburn continues to try to manager her cholesterol with lifestyle modifications including diet and exercise. She was able to get an inperson appointment with her orthopedist and has received 3 "gel" injections into her knee. States the first two really improved her ability to walk without pain, however the 3, which was received exactly a week ago remains "sore". She has not iced or applied heat. She is unable to take NSAIDs because of a history of gastric bleed. She questions the use of OTC Voltaren. She continues to try and follow a lipid lowering diet but admits to eating lots of fried seafood recently on a beach trip with her daughter. She did say that she was able to do a lot of walking "to walk off the food" which she would have never  been able to do prior to the injections. She continues to take her Crestor every other day.   Current Barriers:   Knowledge Deficits related to understanding hypercholesterolemia and self care  Non-adherence to prescribed medication regimen  Nurse Case Manager Clinical Goal(s):   Over the next 30 days, patient will adhere to plan of care estalished to reduce cholesterol through lifestyle modifications including daily exercise/activity, cholesterol medication adherence, and reduction in high fat foods.  Interventions:   Assessed for progression towards goal of diet change and daily activity  Assessed for statin compliance and side effects  Discuss recent knee injections and ability to walk without discomfort  Collaborated with PCP for most appropriate topical anesthetic for knee discomfort secondary to third "gel injection"  Patient Self Care Activities:   Self administers medications as prescribed  Attends all scheduled provider appointments  Calls pharmacy for medication refills  Calls provider office for new concerns or questions   Follows lipid lowering diet and exercise as tolerated  Please see past updates related to this goal by clicking on the "Past Updates" button in the selected goal          Telephone follow up appointment with care management team member scheduled for: 3 weeks  Nancy Blackburn E. Rollene Rotunda, RN, BSN Nurse Care Coordinator [facility] Care Management [phone]

## 2019-06-05 ENCOUNTER — Ambulatory Visit: Payer: Self-pay

## 2019-06-05 ENCOUNTER — Other Ambulatory Visit: Payer: Self-pay | Admitting: Family Medicine

## 2019-06-05 DIAGNOSIS — M25569 Pain in unspecified knee: Secondary | ICD-10-CM

## 2019-06-05 DIAGNOSIS — I10 Essential (primary) hypertension: Secondary | ICD-10-CM

## 2019-06-05 MED ORDER — PROPRANOLOL HCL 20 MG PO TABS: 20.0000 mg | ORAL_TABLET | Freq: Two times a day (BID) | ORAL | 3 refills | Status: DC

## 2019-06-05 NOTE — Telephone Encounter (Signed)
Patient is Totally out of her Propanolol 20 mg.  Please call in ASAP to [facility].

## 2019-06-05 NOTE — Chronic Care Management (AMB) (Signed)
  Chronic Care Management   Note  06/05/2019 Name: DOMNIQUE VANEGAS MRN: [id number] DOB: 1946-10-23  Care Coordination: Successful telephone encounter to Ms. Loreta Ave to confirm that Dr. Rosanna Randy does not want her to use voltaren OTC gel for knee discomfort relief secondary to her history of GI bleed. Patient may use BioFreeze, Ice, or warm heat if helpful. Patient to contact orthopedist if soreness does not soon resolve. Patient verbalized understanding.   Plan: Will follow up in 3 weeks as previously scheduled  Ayjah Show E. Rollene Rotunda, RN, BSN Nurse Care Coordinator [facility] Care [facility] [phone]

## 2019-06-05 NOTE — Telephone Encounter (Signed)
Medication sent into the pharmacy. 

## 2019-06-06 ENCOUNTER — Ambulatory Visit: Payer: Self-pay | Admitting: Pharmacist

## 2019-06-06 DIAGNOSIS — E7800 Pure hypercholesterolemia, unspecified: Secondary | ICD-10-CM

## 2019-06-06 DIAGNOSIS — E785 Hyperlipidemia, unspecified: Secondary | ICD-10-CM

## 2019-06-06 NOTE — Chronic Care Management (AMB) (Signed)
  Chronic Care Management   Note  06/06/2019 Name: ALAISA MOFFITT MRN: [id number] DOB: 04-Nov-1946  73 y.o. year old female contacted by [facility] clinical pharmacist for HLD/statin management per internal referral from [facility]   Was unable to reach patient via telephone today and have left HIPAA compliant voicemail asking patient to return my call. (unsuccessful outreach #1).  Follow up plan: A HIPPA compliant phone message was left for the patient providing contact information and requesting a return call.  The care management team will reach out to the patient again over the next 5-7 days.   Ruben Reason, PharmD Clinical Pharmacist [facility] [phone]

## 2019-06-10 ENCOUNTER — Ambulatory Visit: Payer: Self-pay | Admitting: Pharmacist

## 2019-06-10 ENCOUNTER — Telehealth: Payer: Self-pay

## 2019-06-10 DIAGNOSIS — E785 Hyperlipidemia, unspecified: Secondary | ICD-10-CM

## 2019-06-10 DIAGNOSIS — E7800 Pure hypercholesterolemia, unspecified: Secondary | ICD-10-CM

## 2019-06-10 DIAGNOSIS — R197 Diarrhea, unspecified: Secondary | ICD-10-CM | POA: Diagnosis not present

## 2019-06-10 DIAGNOSIS — K219 Gastro-esophageal reflux disease without esophagitis: Secondary | ICD-10-CM | POA: Diagnosis not present

## 2019-06-10 NOTE — Chronic Care Management (AMB) (Signed)
  Chronic Care Management   Note  06/10/2019 Name: Nancy Blackburn MRN: [id number] DOB: Feb 27, 1946  73 y.o. year old female referred to Chronic Care Management clinical pharmacist by [facility] Trish Fountain for cholesterol management/statin adherence.   Was unable to reach patient via telephone today and have left HIPAA compliant voicemail asking patient to return my call. (unsuccessful outreach #2).  Follow up plan: A HIPPA compliant phone message was left for the patient providing contact information and requesting a return call.  The care management team will reach out to the patient again over the next 14 days.   Ruben Reason, PharmD Clinical Pharmacist [place] [phone]

## 2019-06-25 ENCOUNTER — Ambulatory Visit (INDEPENDENT_AMBULATORY_CARE_PROVIDER_SITE_OTHER): Payer: Medicare Other

## 2019-06-25 ENCOUNTER — Other Ambulatory Visit: Payer: Self-pay

## 2019-06-25 DIAGNOSIS — I10 Essential (primary) hypertension: Secondary | ICD-10-CM

## 2019-06-25 DIAGNOSIS — E7800 Pure hypercholesterolemia, unspecified: Secondary | ICD-10-CM

## 2019-06-25 NOTE — Chronic Care Management (AMB) (Signed)
Chronic Care Management   Follow Up Note   06/25/2019 Name: ALMENDRA LORIA MRN: [id number] DOB: [birth year]  Referred by: Jerrol Banana., MD Reason for referral : Chronic Care Management (follow up )   Subjective: "I am doing OK but my blood pressure has been a little high lately"   Objective:  BP Readings from Last 3 Encounters:  04/10/19 124/82  03/12/19 (!) 138/98  02/28/19 92/60    Assessment:  Gilmer Mor. 71, 73 year old female patient who sees Dr. Miguel Aschoff for primary care. Ms. Mcneece was referred to CCM services by herhealth planfor chronic care management. Ms Roediger has a history of but not limited to HTN, GERD, Major Depression, and HDL. Today RN Cm followed up with Ms. Sebree to discuss progression towards health goals  Review of patient status, including review of consultants reports, relevant laboratory and other test results, and collaboration with appropriate care team members and the patient's provider was performed as part of comprehensive patient evaluation and provision of chronic care management services.    Goals     Ms. Shearon is trying do to better managing her cholesterol with diet and exercise however she continues to eat out and eats a lot of preprepared meals. She has recovered from her knee injections (gel) but is still not walking because of the heat and humidity. She has reduced her intake of sweets and preprepared sweets like cookies and pies and trying to choose healthier options for takeout.  Current Barriers:  Marland Kitchen Knowledge Deficits related to understanding hypercholesterolemia and self care . Non-adherence to prescribed medication regimen  Nurse Case Manager Clinical Goal(s):  Marland Kitchen Over the next 30 days, patient will adhere to plan of care estalished to reduce cholesterol through lifestyle modifications including daily exercise/activity, cholesterol medication adherence, and reduction in high fat foods.   Interventions:  . Assessed for progression towards goal of diet change and daily activity . Assessed for statin compliance and side effects . Discuss recent knee injections and ability to walk without discomfort . Encouraged patient to walk outdoors in the early mornings or late evenings when the weather is cooler   Patient Self Care Activities:  . Self administers medications as prescribed . Attends all scheduled provider appointments . Calls pharmacy for medication refills . Calls provider office for new concerns or questions  . Follows lipid lowering diet and exercise as tolerated  Please see past updates related to this goal by clicking on the "Past Updates" button in the selected goal      . My blood pressure has been running high lately (pt-stated)     Ms. Hohmann reports a hight blood pressure at a recent MD visit however RN CM unable to locate records. Patient states her pressure was 180s/110s and at recheck it had dropped to 170s/95. She was told to continue to check her pressure at home daily and record. Average pressures 150s/80s-90s. She continues to eat out via drive thru and frozen meals. She does not read labels for sodium content. She takes her propranolol as prescribed but admits to taking "1 extra" if her pressure is "too high". She is cautioned against this practice and told to not take additional medications unless she is told to do so by a provider.   Current Barriers:  Marland Kitchen Knowledge Deficits related to basic understanding of hypertension pathophysiology and self care management . Lacks caregiver support.    Nurse Case Manager Clinical Goal(s):  Marland Kitchen Over the next 14 days, patient  will attend all scheduled medical appointments: 07/10/2019 . Over the next 14 days, patient will demonstrate improved health management independence as evidenced by checking blood pressure as directed and notifying PCP if SBP>180or DBP > 100, taking all medications as prescribe, and adhering  to a low sodium diet as discussed.  Interventions:  . Evaluation of current treatment plan related to hypertension self management and patient's adherence to plan as established by provider. . Provided education to patient re: stroke prevention, s/s of heart attack and stroke, DASH diet, complications of uncontrolled blood pressure . Reviewed medications with patient and discussed importance of compliance with taking as prescribed . Discussed risk of taking extra blood pressure medications without discussing with Dr. Rosanna Randy . Discussed plans with patient for ongoing care management follow up and provided patient with direct contact information for care management team . Advised patient, providing education and rationale, to monitor blood pressure daily and record, calling PCP for findings outside established parameters.  . Reviewed scheduled/upcoming provider appointments including: PCP follow up 07/10/2019  Patient Self Care Activities:  . Self administers medications as prescribed . Attends all scheduled provider appointments . Calls provider office for new concerns, questions, or BP outside discussed parameters . Checks BP and records as discussed . Follows a low sodium diet/DASH diet  Initial goal documentation         Telephone follow up appointment with care management team member scheduled for: 3 weeks  Leonidas Boateng E. Rollene Rotunda, RN, BSN Nurse Care Coordinator [facility] Care [facility] [phone]

## 2019-06-25 NOTE — Patient Instructions (Signed)
Thank you allowing the Chronic Care Management Team to be a part of your care! It was a pleasure speaking with you today!  1. Please begin to read food labels and monitor your sodium intake. Limit your sodium to less than 2000mg  per day 2. Continue to limit your baked goods and sweets. These products tend to have a lot of fat/cholesterol in them. 3. Please review the information previously provided to you about a lipid lowering diet 4. Try to walk early in the mornings or late in the evenings as this will help your cholesterol levels and high blood pressure 5. Please call Dr. Rosanna Randy as discussed if your blood pressure is consistently >160  CCM (Chronic Care Management) Team   Trish Fountain RN, BSN Nurse Care Coordinator  [phone]  Ruben Reason PharmD  Clinical Pharmacist  [phone]   Elliot Gurney, LCSW Clinical Social Worker [phone]  Goals Addressed            This Visit's Progress   . I really need to get my cholesterol down (pt-stated)       Current Barriers:  Marland Kitchen Knowledge Deficits related to understanding hypercholesterolemia and self care . Non-adherence to prescribed medication regimen  Nurse Case Manager Clinical Goal(s):  Marland Kitchen Over the next 30 days, patient will adhere to plan of care estalished to reduce cholesterol through lifestyle modifications including daily exercise/activity, cholesterol medication adherence, and reduction in high fat foods.  Interventions:  . Assessed for progression towards goal of diet change and daily activity . Assessed for statin compliance and side effects . Discuss recent knee injections and ability to walk without discomfort   Patient Self Care Activities:  . Self administers medications as prescribed . Attends all scheduled provider appointments . Calls pharmacy for medication refills . Calls provider office for new concerns or questions  . Follows lipid lowering diet and exercise as tolerated  Please see past  updates related to this goal by clicking on the "Past Updates" button in the selected goal      . My blood pressure has been running high lately (pt-stated)       Current Barriers:  Marland Kitchen Knowledge Deficits related to basic understanding of hypertension pathophysiology and self care management . Lacks caregiver support.    Nurse Case Manager Clinical Goal(s):  Marland Kitchen Over the next 14 days, patient will attend all scheduled medical appointments: 07/10/2019 . Over the next 14 days, patient will demonstrate improved health management independence as evidenced by checking blood pressure as directed and notifying PCP if SBP>180or DBP > 100, taking all medications as prescribe, and adhering to a low sodium diet as discussed.  Interventions:  . Evaluation of current treatment plan related to hypertension self management and patient's adherence to plan as established by provider. . Provided education to patient re: stroke prevention, s/s of heart attack and stroke, DASH diet, complications of uncontrolled blood pressure . Reviewed medications with patient and discussed importance of compliance with taking as prescribed . Discussed risk of taking extra blood pressure medications without discussing with Dr. Rosanna Randy . Discussed plans with patient for ongoing care management follow up and provided patient with direct contact information for care management team . Advised patient, providing education and rationale, to monitor blood pressure daily and record, calling PCP for findings outside established parameters.  . Reviewed scheduled/upcoming provider appointments including: PCP follow up 07/10/2019  Patient Self Care Activities:  . Self administers medications as prescribed . Attends all scheduled provider appointments .  Calls provider office for new concerns, questions, or BP outside discussed parameters . Checks BP and records as discussed . Follows a low sodium diet/DASH diet  Initial goal documentation          Print copy of patient instructions provided.   Telephone follow up appointment with care management team member scheduled for:  SYMPTOMS OF A STROKE   You have any symptoms of stroke. "BE FAST" is an easy way to remember the main warning signs: ? B - Balance. Signs are dizziness, sudden trouble walking, or loss of balance. ? E - Eyes. Signs are trouble seeing or a sudden change in how you see. ? F - Face. Signs are sudden weakness or loss of feeling of the face, or the face or eyelid drooping on one side. ? A - Arms. Signs are weakness or loss of feeling in an arm. This happens suddenly and usually on one side of the body. ? S - Speech. Signs are sudden trouble speaking, slurred speech, or trouble understanding what people say. ? T - Time. Time to call emergency services. Write down what time symptoms started.  You have other signs of stroke, such as: ? A sudden, very bad headache with no known cause. ? Feeling sick to your stomach (nausea). ? Throwing up (vomiting). ? Jerky movements you cannot control (seizure).  SYMPTOMS OF A HEART ATTACK  What are the signs or symptoms? Symptoms of this condition include:  Chest pain. It may feel like: ? Crushing or squeezing. ? Tightness, pressure, fullness, or heaviness.  Pain in the arm, neck, jaw, back, or upper body.  Shortness of breath.  Heartburn.  Indigestion.  Nausea.  Cold sweats.  Feeling tired.  Sudden lightheadedness.   Managing Your Hypertension Hypertension is commonly called high blood pressure. This is when the force of your blood pressing against the walls of your arteries is too strong. Arteries are blood vessels that carry blood from your heart throughout your body. Hypertension forces the heart to work harder to pump blood, and may cause the arteries to become narrow or stiff. Having untreated or uncontrolled hypertension can cause heart attack, stroke, kidney disease, and other problems. What are  blood pressure readings? A blood pressure reading consists of a higher number over a lower number. Ideally, your blood pressure should be below 120/80. The first ("top") number is called the systolic pressure. It is a measure of the pressure in your arteries as your heart beats. The second ("bottom") number is called the diastolic pressure. It is a measure of the pressure in your arteries as the heart relaxes. What does my blood pressure reading mean? Blood pressure is classified into four stages. Based on your blood pressure reading, your health care provider may use the following stages to determine what type of treatment you need, if any. Systolic pressure and diastolic pressure are measured in a unit called mm Hg. Normal  Systolic pressure: below 810.  Diastolic pressure: below 80. Elevated  Systolic pressure: 175-102.  Diastolic pressure: below 80. Hypertension stage 1  Systolic pressure: 585-277.  Diastolic pressure: 82-42. Hypertension stage 2  Systolic pressure: 353 or above.  Diastolic pressure: 90 or above. What health risks are associated with hypertension? Managing your hypertension is an important responsibility. Uncontrolled hypertension can lead to:  A heart attack.  A stroke.  A weakened blood vessel (aneurysm).  Heart failure.  Kidney damage.  Eye damage.  Metabolic syndrome.  Memory and concentration problems. What changes can I make  to manage my hypertension? Hypertension can be managed by making lifestyle changes and possibly by taking medicines. Your health care provider will help you make a plan to bring your blood pressure within a normal range. Eating and drinking   Eat a diet that is high in fiber and potassium, and low in salt (sodium), added sugar, and fat. An example eating plan is called the DASH (Dietary Approaches to Stop Hypertension) diet. To eat this way: ? Eat plenty of fresh fruits and vegetables. Try to fill half of your plate at  each meal with fruits and vegetables. ? Eat whole grains, such as whole wheat pasta, brown rice, or whole grain bread. Fill about one quarter of your plate with whole grains. ? Eat low-fat diary products. ? Avoid fatty cuts of meat, processed or cured meats, and poultry with skin. Fill about one quarter of your plate with lean proteins such as fish, chicken without skin, beans, eggs, and tofu. ? Avoid premade and processed foods. These tend to be higher in sodium, added sugar, and fat.  Reduce your daily sodium intake. Most people with hypertension should eat less than 1,500 mg of sodium a day.  Limit alcohol intake to no more than 1 drink a day for nonpregnant women and 2 drinks a day for men. One drink equals 12 oz of beer, 5 oz of wine, or 1 oz of hard liquor. Lifestyle  Work with your health care provider to maintain a healthy body weight, or to lose weight. Ask what an ideal weight is for you.  Get at least 30 minutes of exercise that causes your heart to beat faster (aerobic exercise) most days of the week. Activities may include walking, swimming, or biking.  Include exercise to strengthen your muscles (resistance exercise), such as weight lifting, as part of your weekly exercise routine. Try to do these types of exercises for 30 minutes at least 3 days a week.  Do not use any products that contain nicotine or tobacco, such as cigarettes and e-cigarettes. If you need help quitting, ask your health care provider.  Control any long-term (chronic) conditions you have, such as high cholesterol or diabetes. Monitoring  Monitor your blood pressure at home as told by your health care provider. Your personal target blood pressure may vary depending on your medical conditions, your age, and other factors.  Have your blood pressure checked regularly, as often as told by your health care provider. Working with your health care provider  Review all the medicines you take with your health care  provider because there may be side effects or interactions.  Talk with your health care provider about your diet, exercise habits, and other lifestyle factors that may be contributing to hypertension.  Visit your health care provider regularly. Your health care provider can help you create and adjust your plan for managing hypertension. Will I need medicine to control my blood pressure? Your health care provider may prescribe medicine if lifestyle changes are not enough to get your blood pressure under control, and if:  Your systolic blood pressure is 130 or higher.  Your diastolic blood pressure is 80 or higher. Take medicines only as told by your health care provider. Follow the directions carefully. Blood pressure medicines must be taken as prescribed. The medicine does not work as well when you skip doses. Skipping doses also puts you at risk for problems. Contact a health care provider if:  You think you are having a reaction to medicines you have  taken.  You have repeated (recurrent) headaches.  You feel dizzy.  You have swelling in your ankles.  You have trouble with your vision. Get help right away if:  You develop a severe headache or confusion.  You have unusual weakness or numbness, or you feel faint.  You have severe pain in your chest or abdomen.  You vomit repeatedly.  You have trouble breathing. Summary  Hypertension is when the force of blood pumping through your arteries is too strong. If this condition is not controlled, it may put you at risk for serious complications.  Your personal target blood pressure may vary depending on your medical conditions, your age, and other factors. For most people, a normal blood pressure is less than 120/80.  Hypertension is managed by lifestyle changes, medicines, or both. Lifestyle changes include weight loss, eating a healthy, low-sodium diet, exercising more, and limiting alcohol.   DASH Eating Plan DASH stands for  "Dietary Approaches to Stop Hypertension." The DASH eating plan is a healthy eating plan that has been shown to reduce high blood pressure (hypertension). It may also reduce your risk for type 2 diabetes, heart disease, and stroke. The DASH eating plan may also help with weight loss. What are tips for following this plan?  General guidelines  Avoid eating more than 2,000 mg (milligrams) of salt (sodium) a day. If you have hypertension, you may need to reduce your sodium intake to 1,500 mg a day.  Limit alcohol intake to no more than 1 drinka day for nonpregnant women and 2 drinks a day for men. One drink equals 12 oz of beer, 5 oz of wine, or 1 oz of hard liquor.  Work with your health care provider to maintain a healthy body weight or to lose weight. Ask what an ideal weight is for you.  Get at least 30 minutes of exercise that causes your heart to beat faster (aerobic exercise) most days of the week. Activities may include walking, swimming, or biking.  Work with your health care provider or diet and nutrition specialist (dietitian) to adjust your eating plan to your individual calorie needs. Reading food labels   Check food labels for the amount of sodium per serving. Choose foods with less than 5 percent of the Daily Value of sodium. Generally, foods with less than 300 mg of sodium per serving fit into this eating plan.  To find whole grains, look for the word "whole" as the first word in the ingredient list. Shopping  Buy products labeled as "low-sodium" or "no salt added."  Buy fresh foods. Avoid canned foods and premade or frozen meals. Cooking  Avoid adding salt when cooking. Use salt-free seasonings or herbs instead of table salt or sea salt. Check with your health care provider or pharmacist before using salt substitutes.  Do not fry foods. Cook foods using healthy methods such as baking, boiling, grilling, and broiling instead.  Cook with heart-healthy oils, such as olive,  canola, soybean, or sunflower oil. Meal planning  Eat a balanced diet that includes: ? 5 or more servings of fruits and vegetables each day. At each meal, try to fill half of your plate with fruits and vegetables. ? Up to 6-8 servings of whole grains each day. ? Less than 6 oz of lean meat, poultry, or fish each day. A 3-oz serving of meat is about the same size as a deck of cards. One egg equals 1 oz. ? 2 servings of low-fat dairy each day. ? A  serving of nuts, seeds, or beans 5 times each week. ? Heart-healthy fats. Healthy fats called Omega-3 fatty acids are found in foods such as flaxseeds and coldwater fish, like sardines, salmon, and mackerel.  Limit how much you eat of the following: ? Canned or prepackaged foods. ? Food that is high in trans fat, such as fried foods. ? Food that is high in saturated fat, such as fatty meat. ? Sweets, desserts, sugary drinks, and other foods with added sugar. ? Full-fat dairy products.  Do not salt foods before eating.  Try to eat at least 2 vegetarian meals each week.  Eat more home-cooked food and less restaurant, buffet, and fast food.  When eating at a restaurant, ask that your food be prepared with less salt or no salt, if possible. What foods are recommended? The items listed may not be a complete list. Talk with your dietitian about what dietary choices are best for you. Grains Whole-grain or whole-wheat bread. Whole-grain or whole-wheat pasta. Brown rice. [place]. Bulgur. Whole-grain and low-sodium cereals. Pita bread. Low-fat, low-sodium crackers. Whole-wheat flour tortillas. Vegetables Fresh or frozen vegetables (raw, steamed, roasted, or grilled). Low-sodium or reduced-sodium tomato and vegetable juice. Low-sodium or reduced-sodium tomato sauce and tomato paste. Low-sodium or reduced-sodium canned vegetables. Fruits All fresh, dried, or frozen fruit. Canned fruit in natural juice (without added sugar). Meat and other protein  foods Skinless chicken or [place]. Ground chicken or [place]. Pork with fat trimmed off. Fish and seafood. Egg whites. Dried beans, peas, or lentils. Unsalted nuts, nut butters, and seeds. Unsalted canned beans. Lean cuts of beef with fat trimmed off. Low-sodium, lean deli meat. Dairy Low-fat (1%) or fat-free (skim) milk. Fat-free, low-fat, or reduced-fat cheeses. Nonfat, low-sodium ricotta or cottage cheese. Low-fat or nonfat yogurt. Low-fat, low-sodium cheese. Fats and oils Soft margarine without trans fats. Vegetable oil. Low-fat, reduced-fat, or light mayonnaise and salad dressings (reduced-sodium). Canola, safflower, olive, soybean, and sunflower oils. Avocado. Seasoning and other foods Herbs. Spices. Seasoning mixes without salt. Unsalted popcorn and pretzels. Fat-free sweets.  What foods are not recommended? The items listed may not be a complete list. Talk with your dietitian about what dietary choices are best for you. Grains Baked goods made with fat, such as croissants, muffins, or some breads. Dry pasta or rice meal packs. Vegetables Creamed or fried vegetables. Vegetables in a cheese sauce. Regular canned vegetables (not low-sodium or reduced-sodium). Regular canned tomato sauce and paste (not low-sodium or reduced-sodium). Regular tomato and vegetable juice (not low-sodium or reduced-sodium). Angie Fava. Olives. Fruits Canned fruit in a light or heavy syrup. Fried fruit. Fruit in cream or butter sauce. Meat and other protein foods Fatty cuts of meat. Ribs. Fried meat. Berniece Salines. Sausage. Bologna and other processed lunch meats. Salami. Fatback. Hotdogs. Bratwurst. Salted nuts and seeds. Canned beans with added salt. Canned or smoked fish. Whole eggs or egg yolks. Chicken or [place] with skin. Dairy Whole or 2% milk, cream, and half-and-half. Whole or full-fat cream cheese. Whole-fat or sweetened yogurt. Full-fat cheese. Nondairy creamers. Whipped toppings. Processed cheese and cheese  spreads. Fats and oils Butter. Stick margarine. Lard. Shortening. Ghee. Bacon fat. Tropical oils, such as coconut, palm kernel, or palm oil. Seasoning and other foods Salted popcorn and pretzels. Onion salt, garlic salt, seasoned salt, table salt, and sea salt. Worcestershire sauce. Tartar sauce. Barbecue sauce. Teriyaki sauce. Soy sauce, including reduced-sodium. Steak sauce. Canned and packaged gravies. Fish sauce. Oyster sauce. Cocktail sauce. Horseradish that you find on the shelf. Ketchup.  Mustard. Meat flavorings and tenderizers. Bouillon cubes. Hot sauce and Tabasco sauce. Premade or packaged marinades. Premade or packaged taco seasonings. Relishes. Regular salad dressings. Where to find more information:  National Heart, Lung, and [place]: https://wilson-eaton.com/  American Heart Association: www.heart.org Summary  The DASH eating plan is a healthy eating plan that has been shown to reduce high blood pressure (hypertension). It may also reduce your risk for type 2 diabetes, heart disease, and stroke.  With the DASH eating plan, you should limit salt (sodium) intake to 2,300 mg a day. If you have hypertension, you may need to reduce your sodium intake to 1,500 mg a day.  When on the DASH eating plan, aim to eat more fresh fruits and vegetables, whole grains, lean proteins, low-fat dairy, and heart-healthy fats.  Work with your health care provider or diet and nutrition specialist (dietitian) to adjust your eating plan to your individual calorie needs. This information is not intended to replace advice given to you by your health care provider. Make sure you discuss any questions you have with your health care provider. Document Released: 11/17/2011 Document Revised: 11/10/2017 Document Reviewed: 11/21/2016 Elsevier Patient Education  2020 [employer].

## 2019-06-27 ENCOUNTER — Ambulatory Visit: Payer: Medicare Other | Admitting: Pharmacist

## 2019-06-27 DIAGNOSIS — I10 Essential (primary) hypertension: Secondary | ICD-10-CM

## 2019-06-27 DIAGNOSIS — E7800 Pure hypercholesterolemia, unspecified: Secondary | ICD-10-CM

## 2019-06-27 NOTE — Chronic Care Management (AMB) (Signed)
  Chronic Care Management   Note  06/27/2019 Name: Nancy Blackburn MRN: [id number] DOB: [birth year]  Successful outreach call to [name], patient of Dr. Miguel Blackburn. HIPAA identifiers verified. Pharmacy consult via internal referral from [facility] for BP and cholesterol medication management and adherence.   Nancy Blackburn states that she enjoys working with Nancy Blackburn and she would rather follow up with PharmD following her appointment with Nancy Blackburn on 07/10/19 as he may make adjustments to BP medications and cholesterol medications. States her medications are affordable right now but she sometimes does fall into Medicare gap later in the year.    Follow up plan: The patient has been provided with contact information for the clinical pharmacist and has been advised to call with any medication related questions or concerns.   Ruben Reason, PharmD Clinical Pharmacist [place] [phone]

## 2019-07-10 ENCOUNTER — Ambulatory Visit: Payer: Self-pay | Admitting: Family Medicine

## 2019-07-11 ENCOUNTER — Ambulatory Visit: Payer: Medicare Other

## 2019-07-11 ENCOUNTER — Other Ambulatory Visit: Payer: Self-pay

## 2019-07-11 DIAGNOSIS — I10 Essential (primary) hypertension: Secondary | ICD-10-CM

## 2019-07-11 DIAGNOSIS — E7800 Pure hypercholesterolemia, unspecified: Secondary | ICD-10-CM

## 2019-07-11 NOTE — Chronic Care Management (AMB) (Signed)
Chronic Care Management   Follow Up Note   07/11/2019 Name: STEPHANIEANN POPESCU MRN: [id number] DOB: July 09, 1946  Referred by: Jerrol Banana., MD Reason for referral : Chronic Care Management (follow up HTN)   Subjective: "This heat has just taken all my energy"   Objective:  Assessment: Aniqua Briere. 82, 73 year old female patient who sees Dr. Miguel Aschoff for primary care. Ms. Croston was referred to CCM services by herhealth planfor chronic care management. Ms Spalla has a history of but not limited to HTN, GERD, Major Depression, and HDL.Today RN Cm followed up with Ms. Chittick to discuss progression towards health goals and assess for ongoing hypertension.  Review of patient status, including review of consultants reports, relevant laboratory and other test results, and collaboration with appropriate care team members and the patient's provider was performed as part of comprehensive patient evaluation and provision of chronic care management services.    Goals Addressed            This Visit's Progress   . My blood pressure has been running high lately (pt-stated)       Ms. Merk described significant hypertension during our last encounter. She was taking additional BP medication if her pressure was "too high" but after discussing risk with RN CM she stopped taking additional medication. She has been checking her BP daily. Reports today's pressure as 134/64. She has not had any SBP readings >130 or DBP >80. She is reducing her sodium intake and trying to eat more fresh foods instead of frozen or preprepared meals. She is currently not exercising secondary to heat and humidity as well as her chronic knee pain (which is better post injection).  Current Barriers:  Marland Kitchen Knowledge Deficits related to basic understanding of hypertension pathophysiology and self care management . Lacks caregiver support.    Nurse Case Manager Clinical Goal(s):  Marland Kitchen  Over the next 14 days, patient will attend all scheduled medical appointments: 07/10/2019 . Over the next 14 days, patient will demonstrate improved health management independence as evidenced by checking blood pressure as directed and notifying PCP if SBP>180or DBP > 100, taking all medications as prescribe, and adhering to a low sodium diet as discussed. Goal met 07/11/2019 . Over the next 30 days, patient will continue to demonstrate improved health management independence as evidenced by checking blood pressure as directed and notifying PCP if SBP>180or DBP > 100, taking all medications as prescribe, and adhering to a low sodium diet as discussed.   . Interventions:  . Evaluation of current treatment plan related to hypertension self management and patient's adherence to plan as established by provider. . Reviewed BP log . Discussed heat and humidity's effects on generalized well being and energy levels . Discussed plans with patient for ongoing care management follow up and provided patient with direct contact information for care management team . Advised patient, providing education and rationale, to monitor blood pressure daily and record, calling PCP for findings outside established parameters.  . Reviewed scheduled/upcoming provider appointments including: PCP follow up 07/18/2019  Patient Self Care Activities:  . Self administers medications as prescribed . Attends all scheduled provider appointments . Calls provider office for new concerns, questions, or BP outside discussed parameters . Checks BP and records as discussed . Follows a low sodium diet/DASH diet  Please see past updates related to this goal by clicking on the "Past Updates" button in the selected goal  Telephone follow up appointment with care management team member scheduled for: 07/23/2019 at 9:00  Tandy Grawe E. Rollene Rotunda, RN, BSN Nurse Care Coordinator [facility] [phone]

## 2019-07-11 NOTE — Patient Instructions (Signed)
Thank you allowing the Chronic Care Management Team to be a part of your care! It was a pleasure speaking with you today!  1. Continue to read food labels and monitor your sodium intake. Limit your sodium to less than 2066m per day 2. Continue to limit your baked goods and sweets. These products tend to have a lot of fat/cholesterol in them. 3. Please review the information previously provided to you about a lipid lowering diet 4. Please refer to information previously provided for DASH diet as needed 5. Try to walk early in the mornings or late in the evenings as this will help your cholesterol levels and high blood pressure 6. Please call Dr. GRosanna Randyas discussed if your blood pressure is consistently >160  CCM (Chronic Care Management) Team   PTrish FountainRN, BSN Nurse Care Coordinator  ([phone] JRuben ReasonPharmD  Clinical Pharmacist  ([phone]  CElliot Gurney LCSW Clinical Social Worker ([phone] Goals Addressed            This Visit's Progress   . I really need to get my cholesterol down (pt-stated)       Current Barriers:  .Marland KitchenKnowledge Deficits related to understanding hypercholesterolemia and self care . Non-adherence to prescribed medication regimen  Nurse Case Manager Clinical Goal(s):  .Marland KitchenOver the next 30 days, patient will adhere to plan of care estalished to reduce cholesterol through lifestyle modifications including daily exercise/activity, cholesterol medication adherence, and reduction in high fat foods.  Interventions:  . Assessed for progression towards goal of diet change and daily activity . Assessed for statin compliance and side effects . Discuss recent knee injections and ability to walk without discomfort . Reviewed upcoming appointment with PCP 07/18/2019   Patient Self Care Activities:  . Self administers medications as prescribed . Attends all scheduled provider appointments . Calls pharmacy for medication refills . Calls  provider office for new concerns or questions  . Follows lipid lowering diet and exercise as tolerated  Please see past updates related to this goal by clicking on the "Past Updates" button in the selected goal      . My blood pressure has been running high lately (pt-stated)       Current Barriers:  .Marland KitchenKnowledge Deficits related to basic understanding of hypertension pathophysiology and self care management . Lacks caregiver support.    Nurse Case Manager Clinical Goal(s):  .Marland KitchenOver the next 14 days, patient will attend all scheduled medical appointments: 07/18/2019 . Over the next 14 days, patient will demonstrate improved health management independence as evidenced by checking blood pressure as directed and notifying PCP if SBP>180or DBP > 100, taking all medications as prescribe, and adhering to a low sodium diet as discussed. Goal met 07/11/2019 . Over the next 30 days, patient will continue to demonstrate improved health management independence as evidenced by checking blood pressure as directed and notifying PCP if SBP>180or DBP > 100, taking all medications as prescribe, and adhering to a low sodium diet as discussed.   . Interventions:  . Evaluation of current treatment plan related to hypertension self management and patient's adherence to plan as established by provider. . Reviewed BP log . Discussed heat and humidity's effects on generalized well being and energy levels . Discussed plans with patient for ongoing care management follow up and provided patient with direct contact information for care management team . Advised patient, providing education and rationale, to monitor blood pressure daily and record, calling  PCP for findings outside established parameters.  . Reviewed scheduled/upcoming provider appointments including: PCP follow up 07/18/2019  Patient Self Care Activities:  . Self administers medications as prescribed . Attends all scheduled provider appointments . Calls  provider office for new concerns, questions, or BP outside discussed parameters . Checks BP and records as discussed . Follows a low sodium diet/DASH diet  Please see past updates related to this goal by clicking on the "Past Updates" button in the selected goal          The patient verbalized understanding of instructions provided today and declined a print copy of patient instruction materials.   Telephone follow up appointment with care management team member scheduled for: 07/23/2019  SYMPTOMS OF A STROKE   You have any symptoms of stroke. "BE FAST" is an easy way to remember the main warning signs: ? B - Balance. Signs are dizziness, sudden trouble walking, or loss of balance. ? E - Eyes. Signs are trouble seeing or a sudden change in how you see. ? F - Face. Signs are sudden weakness or loss of feeling of the face, or the face or eyelid drooping on one side. ? A - Arms. Signs are weakness or loss of feeling in an arm. This happens suddenly and usually on one side of the body. ? S - Speech. Signs are sudden trouble speaking, slurred speech, or trouble understanding what people say. ? T - Time. Time to call emergency services. Write down what time symptoms started.  You have other signs of stroke, such as: ? A sudden, very bad headache with no known cause. ? Feeling sick to your stomach (nausea). ? Throwing up (vomiting). ? Jerky movements you cannot control (seizure).  SYMPTOMS OF A HEART ATTACK  What are the signs or symptoms? Symptoms of this condition include:  Chest pain. It may feel like: ? Crushing or squeezing. ? Tightness, pressure, fullness, or heaviness.  Pain in the arm, neck, jaw, back, or upper body.  Shortness of breath.  Heartburn.  Indigestion.  Nausea.  Cold sweats.  Feeling tired.  Sudden lightheadedness.

## 2019-07-18 ENCOUNTER — Encounter: Payer: Self-pay | Admitting: Family Medicine

## 2019-07-18 ENCOUNTER — Ambulatory Visit (INDEPENDENT_AMBULATORY_CARE_PROVIDER_SITE_OTHER): Payer: Medicare Other | Admitting: Family Medicine

## 2019-07-18 ENCOUNTER — Other Ambulatory Visit: Payer: Self-pay

## 2019-07-18 VITALS — BP 102/70 | HR 60 | Temp 98.4°F | Wt 189.0 lb

## 2019-07-18 DIAGNOSIS — R531 Weakness: Secondary | ICD-10-CM

## 2019-07-18 DIAGNOSIS — G250 Essential tremor: Secondary | ICD-10-CM

## 2019-07-18 DIAGNOSIS — G20 Parkinson's disease: Secondary | ICD-10-CM | POA: Diagnosis not present

## 2019-07-18 DIAGNOSIS — E538 Deficiency of other specified B group vitamins: Secondary | ICD-10-CM

## 2019-07-18 DIAGNOSIS — F411 Generalized anxiety disorder: Secondary | ICD-10-CM

## 2019-07-18 DIAGNOSIS — K130 Diseases of lips: Secondary | ICD-10-CM

## 2019-07-18 DIAGNOSIS — B372 Candidiasis of skin and nail: Secondary | ICD-10-CM | POA: Diagnosis not present

## 2019-07-18 MED ORDER — ROSUVASTATIN CALCIUM 10 MG PO TABS: 10.0000 mg | ORAL_TABLET | Freq: Every day | ORAL | 3 refills | Status: DC

## 2019-07-18 NOTE — Progress Notes (Signed)
[name] Female    DOB: 11-04-1946   73 y.o.   MRN: [id number] Visit Date: 07/18/2019  Today's Provider: Wilhemena Durie, MD   Chief Complaint  Patient presents with  . Fatigue  . Hyperlipidemia  . Hypertension  . Rash   Subjective:     Rash This is a chronic problem. The current episode started more than 1 month ago. The problem is unchanged. The affected locations include the face. Associated symptoms include diarrhea (occasionally) and fatigue. Pertinent negatives include no fever or vomiting.      Hypertension, follow-up:  BP Readings from Last 3 Encounters:  04/10/19 124/82  03/12/19 (!) 138/98  02/28/19 92/60    She was last seen for hypertension 3 months ago.  BP at that visit was 124/82. Management since that visit includes No changes She reports excellent compliance with treatment. She is not having side effects.  She is exercising. She is adherent to low salt diet.   Outside blood pressures are 130-140's/ 100's. She is experiencing fatigue.  Patient denies chest pain, chest pressure/discomfort, lower extremity edema and palpitations.   Cardiovascular risk factors include advanced age (older than 99 for men, 44 for women) and hypertension.  Use of agents associated with hypertension: none.   ------------------------------------------------------------------------    Lipid/Cholesterol, Follow-up:   Last seen for this 3 months ago.  Management since that visit includes work on lifestyle changes.  Last Lipid Panel:    Component Value Date/Time   CHOL 200 (H) 04/12/2019 1116   TRIG 154 (H) 04/12/2019 1116   HDL 59 04/12/2019 1116   CHOLHDL 3.4 04/12/2019 1116   LDLCALC 110 (H) 04/12/2019 1116    She reports excellent compliance with treatment. She is not having side effects.   Wt Readings from Last 3 Encounters:  04/10/19 192 lb (87.1 kg)  03/12/19 190 lb 3.2 oz (86.3 kg)  02/28/19 190 lb (86.2 kg)     ------------------------------------------------------------------------    Allergies  Allergen Reactions  . Anti-Inflammatory Enzyme [Nutritional Supplements] Other (See Comments)    REACTION: Due to bleeding ulcer  . Cymbalta [Duloxetine Hcl] Other (See Comments)    REACTION: Urinary retention  . Clarithromycin Other (See Comments)    Upsets stomach  . Nsaids     GI BLEED  . Ranitidine     severe diarrhea  . Venlafaxine     severe depression     Current Outpatient Medications:  .  albuterol (PROVENTIL HFA;VENTOLIN HFA) 108 (90 Base) MCG/ACT inhaler, Inhale 1-2 puffs into the lungs every 6 (six) hours as needed for wheezing or shortness of breath., Disp: 3 Inhaler, Rfl: 3 .  alendronate (FOSAMAX) 70 MG tablet, TAKE 1 TABLET BY MOUTH EVERY 7 DAYS. TAKE WITH A FULL GLASS OF WATER ON AN EMPTY STOMACH, Disp: 4 tablet, Rfl: 11 .  ARIPiprazole (ABILIFY) 5 MG tablet, Take 5 mg by mouth daily., Disp: , Rfl:  .  budesonide (ENTOCORT EC) 3 MG 24 hr capsule, Take 3 mg by mouth daily. Take 3 capsules (9mg ) every morning, Disp: , Rfl:  .  busPIRone (BUSPAR) 10 MG tablet, Take 10 mg by mouth 2 (two) times daily. , Disp: , Rfl:  .  Calcium Carbonate-Vitamin D (CALCIUM 600+D) 600-200 MG-UNIT TABS, Take 1 tablet by mouth daily., Disp: , Rfl:  .  citalopram (CELEXA) 40 MG tablet, 40 mg daily. , Disp: , Rfl:  .  donepezil (ARICEPT) 5 MG tablet, Take 5 mg by mouth  at bedtime., Disp: , Rfl:  .  fluticasone (FLONASE) 50 MCG/ACT nasal spray, Place 2 sprays into both nostrils daily., Disp: 48 g, Rfl: 4 .  Fluticasone-Salmeterol (ADVAIR DISKUS) 250-50 MCG/DOSE AEPB, Inhale 1 puff into the lungs 2 (two) times daily., Disp: 180 each, Rfl: 3 .  omeprazole (PRILOSEC) 20 MG capsule, , Disp: , Rfl:  .  primidone (MYSOLINE) 50 MG tablet, Take 50 mg by mouth at bedtime., Disp: , Rfl:  .  propranolol (INDERAL) 20 MG tablet, Take 1 tablet (20 mg total) by mouth 2 (two) times daily., Disp: 180 tablet, Rfl: 3 .   rosuvastatin (CRESTOR) 10 MG tablet, Take 1 tablet (10 mg total) by mouth daily. (Patient taking differently: Take 10 mg by mouth daily. Takes EOD), Disp: 90 tablet, Rfl: 3 .  traZODone (DESYREL) 100 MG tablet, Take 250 mg by mouth at bedtime. , Disp: , Rfl:  .  vitamin B-12 (CYANOCOBALAMIN) 1000 MCG tablet, Take 1,000 mcg by mouth daily., Disp: , Rfl:   Review of Systems  Constitutional: Positive for fatigue. Negative for activity change, appetite change, chills, diaphoresis, fever and unexpected weight change.  Respiratory: Negative.   Cardiovascular: Negative.   Gastrointestinal: Positive for diarrhea (occasionally). Negative for abdominal distention, abdominal pain, anal bleeding, blood in stool, constipation, nausea, rectal pain and vomiting.  Musculoskeletal: Positive for gait problem. Negative for arthralgias, back pain, joint swelling, myalgias, neck pain and neck stiffness.  Skin: Positive for rash (Under her lips). Negative for color change, pallor and wound.  Neurological: Positive for tremors. Negative for dizziness, seizures, light-headedness and headaches.    Social History   Tobacco Use  . Smoking status: Never Smoker  . Smokeless tobacco: Never Used  Substance Use Topics  . Alcohol use: Yes    Alcohol/week: 0.0 standard drinks    Comment: drinks 3-4 times a year      Objective:   There were no vitals taken for this visit. There were no vitals filed for this visit.   Physical Exam Vitals signs reviewed.  Constitutional:      Appearance: She is well-developed.  HENT:     Head: Normocephalic and atraumatic.     Right Ear: External ear normal.     Left Ear: External ear normal.     Nose: Nose normal.  Eyes:     General: No scleral icterus.    Conjunctiva/sclera: Conjunctivae normal.  Neck:     Thyroid: No thyromegaly.  Cardiovascular:     Rate and Rhythm: Normal rate and regular rhythm.     Heart sounds: Normal heart sounds.  Pulmonary:     Effort:  Pulmonary effort is normal.     Breath sounds: Normal breath sounds.  Chest:     Breasts: Breasts are symmetrical.        Right: Normal.        Left: Normal.  Abdominal:     Palpations: Abdomen is soft.  Skin:    General: Skin is warm and dry.     Findings: Rash present.     Comments: Mild rash on lower lip c/w yeast/dermatitis. Also mild chelitis  Neurological:     Mental Status: She is alert and oriented to person, place, and time. Mental status is at baseline.     Comments: Masked facies. Mild Shuffling gait  Psychiatric:        Mood and Affect: Mood normal.        Behavior: Behavior normal.        Thought  Content: Thought content normal.        Judgment: Judgment normal.      No results found for any visits on 07/18/19.     Assessment & Plan    1. Parkinson's disease ([place]) Will try to contact Dr Manuella Ghazi.  2. Benign essential tremor   3. Weakness generalized   4. Anxiety, generalized   5. B12 deficiency   6. Cheilitis Try lysine daily.  7. Yeast dermatitis Nystatin cream prn.     Glendon Dunwoody Cranford Mon, MD  [facility]

## 2019-07-18 NOTE — Patient Instructions (Signed)
Try OTC Lysine once a day.

## 2019-07-23 ENCOUNTER — Other Ambulatory Visit: Payer: Self-pay

## 2019-07-23 ENCOUNTER — Ambulatory Visit: Payer: Medicare Other

## 2019-07-23 DIAGNOSIS — G250 Essential tremor: Secondary | ICD-10-CM

## 2019-07-23 DIAGNOSIS — E7800 Pure hypercholesterolemia, unspecified: Secondary | ICD-10-CM

## 2019-07-23 NOTE — Chronic Care Management (AMB) (Signed)
Chronic Care Management   Follow Up Note   07/23/2019 Name: Nancy Blackburn MRN: [id number] DOB: [birth year]  Referred by: Jerrol Banana., MD Reason for referral : Chronic Care Management (follow up)   Subjective: "My blood pressure was really good at Dr. Marlan Palau visit" "He will recheck my cholesterol in November"   Objective:  BP Readings from Last 3 Encounters:  07/18/19 102/70  04/10/19 124/82  03/12/19 (!) 138/98    Assessment: Nancy Blackburn. 84, 73 year old female patient who sees Dr. Miguel Aschoff for primary care. Nancy Blackburn was referred to CCM services by herhealth planfor chronic care management. Nancy Blackburn has a history of but not limited to HTN, GERD, Major Depression, and HDL.Today RN Cm followed up with Nancy Blackburn to discuss progression towards health goals and follow up post recent PCP visit.  Review of patient status, including review of consultants reports, relevant laboratory and other test results, and collaboration with appropriate care team members and the patient's provider was performed as part of comprehensive patient evaluation and provision of chronic care management services.    Goals Addressed            This Visit's Progress   . I really need to get my cholesterol down (pt-stated)       Current Barriers:  Marland Kitchen Knowledge Deficits related to understanding hypercholesterolemia and self care . Non-adherence to prescribed medication regimen  Nurse Case Manager Clinical Goal(s):  Marland Kitchen Over the next 30 days, patient will adhere to plan of care estalished to reduce cholesterol through lifestyle modifications including daily exercise/activity, cholesterol medication adherence, and reduction in high fat foods-goal met 07/23/2019 . Marland KitchenOver the next 90 days, patient will adhere to plan of care estalished to reduce cholesterol through lifestyle modifications including daily exercise/activity, cholesterol medication adherence, and  reduction in high fat foods  Interventions:  . Assessed for progression towards goal of diet change and daily activity . Assessed for statin compliance and side effects . Discuss recent knee injections and ability to walk without discomfort . Reviewed upcoming appointment with PCP 11/9//2020   Patient Self Care Activities:  . Self administers medications as prescribed . Attends all scheduled provider appointments . Calls pharmacy for medication refills . Calls provider office for new concerns or questions  . Follows lipid lowering diet and exercise as tolerated  Please see past updates related to this goal by clicking on the "Past Updates" button in the selected goal      . My blood pressure has been running high lately (pt-stated)       Nancy Blackburn states her BP is back to "normal". She continues to check several times a week and reports numbers less than 130/80. She is reading labels and doing much better with decreasing   Current Barriers:  Marland Kitchen Knowledge Deficits related to basic understanding of hypertension pathophysiology and self care management . Lacks caregiver support.    Nurse Case Manager Clinical Goal(s):  Marland Kitchen Over the next 30 days, patient will continue to demonstrate improved health management independence as evidenced by checking blood pressure as directed and notifying PCP if SBP>180or DBP > 100, taking all medications as prescribe, and adhering to a low sodium diet as discussed-goal met 07/23/2019 . Over the next 90 days, patient will continue to demonstrate improved health management independence as evidenced by checking blood pressure as directed and notifying PCP if SBP>180or DBP > 100, taking all medications as prescribe, and adhering to a low sodium diet as discussed  Interventions:  . Evaluation of current treatment plan related to hypertension self management and patient's adherence to plan as established by provider. . Reviewed BP log . Discussed heat and  humidity's effects on generalized well being and energy levels . Discussed plans with patient for ongoing care management follow up and provided patient with direct contact information for care management team . Advised patient, providing education and rationale, to monitor blood pressure daily and record, calling PCP for findings outside established parameters.  . Reviewed scheduled/upcoming provider appointments including: PCP follow up 10/21/2019  Patient Self Care Activities:  . Self administers medications as prescribed . Attends all scheduled provider appointments . Calls provider office for new concerns, questions, or BP outside discussed parameters . Checks BP and records as discussed . Follows a low sodium diet/DASH diet  Please see past updates related to this goal by clicking on the "Past Updates" button in the selected goal           The care management team will reach out to the patient again over the next 90 days.    Renold Kozar E. Rollene Rotunda, RN, BSN Nurse Care Coordinator [facility] [phone]

## 2019-07-23 NOTE — Patient Instructions (Addendum)
Thank you allowing the Chronic Care Management Team to be a part of your care! It was a pleasure speaking with you today!  1. Continue to read food labels and monitor your sodium intake. Limit your sodium to less than 2017m per day 2. Continue to limit your baked goods and sweets. These products tend to have a lot of fat/cholesterol in them. 3. Please review the information previously provided to you about a lipid lowering diet 4. Please refer to information previously provided for DASH diet as needed 5. Try to walk early in the mornings or late in the evenings as this will help your cholesterol levels and high blood pressure 6. Please call Dr. GRosanna Randyas discussed if your blood pressure is consistently >160  CCM (Chronic Care Management) Team   PTrish FountainRN, BSN Nurse Care Coordinator  [phone] JRuben ReasonPharmD  Clinical Pharmacist  [phone]  CElliot Gurney LCSW Clinical Social Worker [phone] Goals Addressed            This Visit's Progress   . I really need to get my cholesterol down (pt-stated)       Current Barriers:  .Marland KitchenKnowledge Deficits related to understanding hypercholesterolemia and self care . Non-adherence to prescribed medication regimen  Nurse Case Manager Clinical Goal(s):  .Marland KitchenOver the next 30 days, patient will adhere to plan of care estalished to reduce cholesterol through lifestyle modifications including daily exercise/activity, cholesterol medication adherence, and reduction in high fat foods-goal met 07/23/2019 . .Marland Kitchenver the next 90 days, patient will adhere to plan of care estalished to reduce cholesterol through lifestyle modifications including daily exercise/activity, cholesterol medication adherence, and reduction in high fat foods  Interventions:  . Assessed for progression towards goal of diet change and daily activity . Assessed for statin compliance and side effects . Discuss recent knee injections and ability to walk  without discomfort . Reviewed upcoming appointment with PCP 11/9//2020   Patient Self Care Activities:  . Self administers medications as prescribed . Attends all scheduled provider appointments . Calls pharmacy for medication refills . Calls provider office for new concerns or questions  . Follows lipid lowering diet and exercise as tolerated  Please see past updates related to this goal by clicking on the "Past Updates" button in the selected goal      . My blood pressure has been running high lately (pt-stated)       Current Barriers:  .Marland KitchenKnowledge Deficits related to basic understanding of hypertension pathophysiology and self care management . Lacks caregiver support.    Nurse Case Manager Clinical Goal(s):  .Marland KitchenOver the next 30 days, patient will continue to demonstrate improved health management independence as evidenced by checking blood pressure as directed and notifying PCP if SBP>180or DBP > 100, taking all medications as prescribe, and adhering to a low sodium diet as discussed-goal met 07/23/2019 . Over the next 90 days, patient will continue to demonstrate improved health management independence as evidenced by checking blood pressure as directed and notifying PCP if SBP>180or DBP > 100, taking all medications as prescribe, and adhering to a low sodium diet as discussed  Interventions:  . Evaluation of current treatment plan related to hypertension self management and patient's adherence to plan as established by provider. . Reviewed BP log . Discussed heat and humidity's effects on generalized well being and energy levels . Discussed plans with patient for ongoing care management follow up and provided patient with direct contact information  for care management team . Advised patient, providing education and rationale, to monitor blood pressure daily and record, calling PCP for findings outside established parameters.  . Reviewed scheduled/upcoming provider appointments  including: PCP follow up 10/21/2019  Patient Self Care Activities:  . Self administers medications as prescribed . Attends all scheduled provider appointments . Calls provider office for new concerns, questions, or BP outside discussed parameters . Checks BP and records as discussed . Follows a low sodium diet/DASH diet  Please see past updates related to this goal by clicking on the "Past Updates" button in the selected goal          The patient verbalized understanding of instructions provided today and declined a print copy of patient instruction materials.   The care management team will reach out to the patient again over the next 90 days.   SYMPTOMS OF A STROKE   You have any symptoms of stroke. "BE FAST" is an easy way to remember the main warning signs: ? B - Balance. Signs are dizziness, sudden trouble walking, or loss of balance. ? E - Eyes. Signs are trouble seeing or a sudden change in how you see. ? F - Face. Signs are sudden weakness or loss of feeling of the face, or the face or eyelid drooping on one side. ? A - Arms. Signs are weakness or loss of feeling in an arm. This happens suddenly and usually on one side of the body. ? S - Speech. Signs are sudden trouble speaking, slurred speech, or trouble understanding what people say. ? T - Time. Time to call emergency services. Write down what time symptoms started.  You have other signs of stroke, such as: ? A sudden, very bad headache with no known cause. ? Feeling sick to your stomach (nausea). ? Throwing up (vomiting). ? Jerky movements you cannot control (seizure).  SYMPTOMS OF A HEART ATTACK  What are the signs or symptoms? Symptoms of this condition include:  Chest pain. It may feel like: ? Crushing or squeezing. ? Tightness, pressure, fullness, or heaviness.  Pain in the arm, neck, jaw, back, or upper body.  Shortness of breath.  Heartburn.  Indigestion.  Nausea.  Cold sweats.  Feeling  tired.  Sudden lightheadedness.

## 2019-08-06 DIAGNOSIS — F411 Generalized anxiety disorder: Secondary | ICD-10-CM | POA: Diagnosis not present

## 2019-08-06 DIAGNOSIS — Z6829 Body mass index (BMI) 29.0-29.9, adult: Secondary | ICD-10-CM | POA: Diagnosis not present

## 2019-08-06 DIAGNOSIS — F331 Major depressive disorder, recurrent, moderate: Secondary | ICD-10-CM | POA: Diagnosis not present

## 2019-08-06 DIAGNOSIS — F5105 Insomnia due to other mental disorder: Secondary | ICD-10-CM | POA: Diagnosis not present

## 2019-08-06 DIAGNOSIS — M545 Low back pain: Secondary | ICD-10-CM | POA: Diagnosis not present

## 2019-08-06 DIAGNOSIS — I10 Essential (primary) hypertension: Secondary | ICD-10-CM | POA: Diagnosis not present

## 2019-08-12 DIAGNOSIS — R197 Diarrhea, unspecified: Secondary | ICD-10-CM | POA: Diagnosis not present

## 2019-09-05 DIAGNOSIS — F5105 Insomnia due to other mental disorder: Secondary | ICD-10-CM | POA: Diagnosis not present

## 2019-09-05 DIAGNOSIS — F331 Major depressive disorder, recurrent, moderate: Secondary | ICD-10-CM | POA: Diagnosis not present

## 2019-09-05 DIAGNOSIS — F411 Generalized anxiety disorder: Secondary | ICD-10-CM | POA: Diagnosis not present

## 2019-09-06 DIAGNOSIS — F331 Major depressive disorder, recurrent, moderate: Secondary | ICD-10-CM | POA: Diagnosis not present

## 2019-09-06 DIAGNOSIS — F5105 Insomnia due to other mental disorder: Secondary | ICD-10-CM | POA: Diagnosis not present

## 2019-09-11 DIAGNOSIS — M170 Bilateral primary osteoarthritis of knee: Secondary | ICD-10-CM | POA: Diagnosis not present

## 2019-09-12 DIAGNOSIS — R197 Diarrhea, unspecified: Secondary | ICD-10-CM | POA: Diagnosis not present

## 2019-10-02 DIAGNOSIS — R2681 Unsteadiness on feet: Secondary | ICD-10-CM | POA: Diagnosis not present

## 2019-10-02 DIAGNOSIS — G250 Essential tremor: Secondary | ICD-10-CM | POA: Diagnosis not present

## 2019-10-02 DIAGNOSIS — G3184 Mild cognitive impairment, so stated: Secondary | ICD-10-CM | POA: Diagnosis not present

## 2019-10-03 DIAGNOSIS — F5105 Insomnia due to other mental disorder: Secondary | ICD-10-CM | POA: Diagnosis not present

## 2019-10-03 DIAGNOSIS — F331 Major depressive disorder, recurrent, moderate: Secondary | ICD-10-CM | POA: Diagnosis not present

## 2019-10-18 NOTE — Progress Notes (Signed)
[name] Female    DOB: Jan 09, 1946   73 y.o.   MRN: [id number] Visit Date: 10/21/2019  Today's Provider: Wilhemena Durie, MD   Chief Complaint  Patient presents with  . Follow-up  . Tremors  . Anxiety   Subjective:   The only issue she states today is that she is feeling weaker.  She thinks her tremor is getting worse and cause her to feel weaker and less stable with walking.     Parkinson's disease ([place]) From 07/18/2019- Will try to contact Dr Manuella Ghazi.  BP Readings from Last 3 Encounters:  10/21/19 109/68  07/18/19 102/70  04/10/19 124/82   Wt Readings from Last 3 Encounters:  10/21/19 188 lb (85.3 kg)  07/18/19 189 lb (85.7 kg)  04/10/19 192 lb (87.1 kg)   Anxiety, generalized From 07/18/2019- No changes.   B12 deficiency From 07/18/2019- No changes.   Cheilitis-Lip inflammation From 07/18/2019- Try lysine daily. This has resolved. Yeast dermatitis From 07/18/2019- Nystatin cream prn.  Improving.  Allergies  Allergen Reactions  . Anti-Inflammatory Enzyme [Nutritional Supplements] Other (See Comments)    REACTION: Due to bleeding ulcer  . Cymbalta [Duloxetine Hcl] Other (See Comments)    REACTION: Urinary retention  . Clarithromycin Other (See Comments)    Upsets stomach  . Nsaids     GI BLEED  . Ranitidine     severe diarrhea  . Venlafaxine     severe depression     Current Outpatient Medications:  .  albuterol (PROVENTIL HFA;VENTOLIN HFA) 108 (90 Base) MCG/ACT inhaler, Inhale 1-2 puffs into the lungs every 6 (six) hours as needed for wheezing or shortness of breath., Disp: 3 Inhaler, Rfl: 3 .  alendronate (FOSAMAX) 70 MG tablet, TAKE 1 TABLET BY MOUTH EVERY 7 DAYS. TAKE WITH A FULL GLASS OF WATER ON AN EMPTY STOMACH, Disp: 4 tablet, Rfl: 11 .  ARIPiprazole (ABILIFY) 5 MG tablet, Take 5 mg by mouth daily., Disp: , Rfl:  .  busPIRone (BUSPAR) 10 MG tablet, Take 10 mg by mouth 2 (two) times daily. , Disp: , Rfl:  .  Calcium  Carbonate-Vitamin D (CALCIUM 600+D) 600-200 MG-UNIT TABS, Take 1 tablet by mouth daily., Disp: , Rfl:  .  citalopram (CELEXA) 40 MG tablet, 40 mg daily. , Disp: , Rfl:  .  colestipol (COLESTID) 1 g tablet, Take by mouth., Disp: , Rfl:  .  donepezil (ARICEPT) 5 MG tablet, Take 5 mg by mouth at bedtime., Disp: , Rfl:  .  fluticasone (FLONASE) 50 MCG/ACT nasal spray, Place 2 sprays into both nostrils daily., Disp: 48 g, Rfl: 4 .  Fluticasone-Salmeterol (ADVAIR DISKUS) 250-50 MCG/DOSE AEPB, Inhale 1 puff into the lungs 2 (two) times daily., Disp: 180 each, Rfl: 3 .  omeprazole (PRILOSEC) 20 MG capsule, , Disp: , Rfl:  .  primidone (MYSOLINE) 50 MG tablet, Take 50 mg by mouth at bedtime., Disp: , Rfl:  .  propranolol (INDERAL) 10 MG tablet, Please take in additional to 80 mg tablet on days where tremors are worse, Disp: , Rfl:  .  propranolol (INDERAL) 20 MG tablet, Take 1 tablet (20 mg total) by mouth 2 (two) times daily., Disp: 180 tablet, Rfl: 3 .  rosuvastatin (CRESTOR) 10 MG tablet, Take 1 tablet (10 mg total) by mouth daily., Disp: 90 tablet, Rfl: 3 .  traZODone (DESYREL) 100 MG tablet, Take 250 mg by mouth at bedtime. , Disp: , Rfl:  .  vitamin B-12 (CYANOCOBALAMIN)  1000 MCG tablet, Take 1,000 mcg by mouth daily., Disp: , Rfl:  .  budesonide (ENTOCORT EC) 3 MG 24 hr capsule, Take 3 mg by mouth daily. Take 3 capsules (9mg ) every morning, Disp: , Rfl:   Review of Systems  Constitutional: Negative for activity change and fatigue.  Respiratory: Negative for cough and shortness of breath.   Cardiovascular: Negative for chest pain, palpitations and leg swelling.  Endocrine: Negative for cold intolerance, heat intolerance, polydipsia, polyphagia and polyuria.  Musculoskeletal: Positive for arthralgias and myalgias.  Neurological: Positive for tremors.       Chronic issue  Psychiatric/Behavioral: Negative for agitation, decreased concentration, self-injury, sleep disturbance and suicidal ideas. The  patient is not nervous/anxious.     Social History   Tobacco Use  . Smoking status: Never Smoker  . Smokeless tobacco: Never Used  Substance Use Topics  . Alcohol use: Yes    Alcohol/week: 0.0 standard drinks    Comment: drinks 3-4 times a year      Objective:   BP 109/68 (BP Location: Right Arm, Patient Position: Sitting, Cuff Size: Large)   Pulse (!) 54   Temp (!) 96.9 F (36.1 C) (Other (Comment))   Resp 18   Ht 5\' 7"  (1.702 m)   Wt 188 lb (85.3 kg)   SpO2 96%   BMI 29.44 kg/m  Vitals:   10/21/19 1358  BP: 109/68  Pulse: (!) 54  Resp: 18  Temp: (!) 96.9 F (36.1 C)  TempSrc: Other (Comment)  SpO2: 96%  Weight: 188 lb (85.3 kg)  Height: 5\' 7"  (1.702 m)  Body mass index is 29.44 kg/m.   Physical Exam Vitals signs reviewed.  Constitutional:      Appearance: Normal appearance. She is well-developed.  HENT:     Head: Normocephalic and atraumatic.     Right Ear: External ear normal.     Left Ear: External ear normal.     Nose: Nose normal.  Eyes:     General: No scleral icterus.    Conjunctiva/sclera: Conjunctivae normal.  Neck:     Thyroid: No thyromegaly.  Cardiovascular:     Rate and Rhythm: Normal rate and regular rhythm.     Heart sounds: Normal heart sounds.  Pulmonary:     Effort: Pulmonary effort is normal.     Breath sounds: Normal breath sounds.  Chest:     Breasts: Breasts are symmetrical.        Right: Normal.        Left: Normal.  Abdominal:     Palpations: Abdomen is soft.  Skin:    General: Skin is warm and dry.  Neurological:     Mental Status: She is alert and oriented to person, place, and time. Mental status is at baseline.     Comments: Masked facies. Mild Shuffling gait  Psychiatric:        Mood and Affect: Mood normal.        Behavior: Behavior normal.        Thought Content: Thought content normal.        Judgment: Judgment normal.      No results found for any visits on 10/21/19.     Assessment & Plan    1.  Need for influenza vaccination  - Flu Vaccine QUAD High Dose(Fluad)  2. Parkinson's disease ([facility])/symptoms Per neurology.  3. Essential hypertension Very good control.  Watch closely as we are increasing beta-blockade today although very slightly  4. Tremor Slowly increase propranolol dose  with the idea that we must watch her heart rate also.  As long as it stays above 50 I think she will be fine.  Could be made worse by Abilify.  May discuss with psychiatry if she is stable enough in the future to come off of this. - propranolol (INDERAL) 10 MG tablet; Take 1 tablet (10 mg total) by mouth 2 (two) times daily.  Dispense: 180 tablet; Refill: 1   Follow up in one month.     Richard Cranford Mon, MD  [facility]

## 2019-10-21 ENCOUNTER — Other Ambulatory Visit: Payer: Self-pay

## 2019-10-21 ENCOUNTER — Encounter: Payer: Self-pay | Admitting: Family Medicine

## 2019-10-21 ENCOUNTER — Ambulatory Visit (INDEPENDENT_AMBULATORY_CARE_PROVIDER_SITE_OTHER): Payer: Medicare Other | Admitting: Family Medicine

## 2019-10-21 VITALS — BP 109/68 | HR 54 | Temp 96.9°F | Resp 18 | Ht 67.0 in | Wt 188.0 lb

## 2019-10-21 DIAGNOSIS — I10 Essential (primary) hypertension: Secondary | ICD-10-CM

## 2019-10-21 DIAGNOSIS — R251 Tremor, unspecified: Secondary | ICD-10-CM

## 2019-10-21 DIAGNOSIS — G20 Parkinson's disease: Secondary | ICD-10-CM

## 2019-10-21 DIAGNOSIS — Z23 Encounter for immunization: Secondary | ICD-10-CM | POA: Diagnosis not present

## 2019-10-21 MED ORDER — PROPRANOLOL HCL 10 MG PO TABS: 10.0000 mg | ORAL_TABLET | Freq: Two times a day (BID) | ORAL | 1 refills | Status: DC

## 2019-10-29 ENCOUNTER — Other Ambulatory Visit: Payer: Self-pay | Admitting: Family Medicine

## 2019-10-29 DIAGNOSIS — Z1231 Encounter for screening mammogram for malignant neoplasm of breast: Secondary | ICD-10-CM

## 2019-11-12 DIAGNOSIS — F5105 Insomnia due to other mental disorder: Secondary | ICD-10-CM | POA: Diagnosis not present

## 2019-11-12 DIAGNOSIS — F331 Major depressive disorder, recurrent, moderate: Secondary | ICD-10-CM | POA: Diagnosis not present

## 2019-11-20 ENCOUNTER — Ambulatory Visit: Payer: Self-pay | Admitting: Family Medicine

## 2019-11-26 ENCOUNTER — Ambulatory Visit: Payer: Medicare Other | Admitting: Family Medicine

## 2019-12-09 NOTE — Progress Notes (Signed)
+-/      [name] Female    DOB: 1946-11-23   73 y.o.   MRN: [id number] Visit Date: 12/17/2019  Today's Provider: Wilhemena Durie, MD   Chief Complaint  Patient presents with  . Hypertension  . Tremors  . Depression   Subjective:    HPI   Follow up for essential hypertension  The patient was last seen for this 1 months ago. Changes made at last visit include no changes.  She reports excellent compliance with treatment. She feels that condition is Unchanged. She is not having side effects.   ------------------------------------------------------------------------------------  Follow up for Tremor  The patient was last seen for this 1 months ago. Changes made at last visit include slowly increase propranolol to 10 mg BID. Continue monitoring heart rate. Heart rate should stay above 50.  She reports excellent compliance with treatment. She feels that condition is Unchanged. She is not having side effects.   ------------------------------------------------------------------------------------  Depression, Follow-up  She  was last seen for this 1 months ago. Changes made at last visit include no changes, continue current medications. Followed by psychiatry.    She reports excellent compliance with treatment. She is not having side effects.   She reports excellent tolerance of treatment. Current symptoms include: fatigue She feels she is Unchanged since last visit.  ------------------------------------------------------------------------   Allergies  Allergen Reactions  . Anti-Inflammatory Enzyme [Nutritional Supplements] Other (See Comments)    REACTION: Due to bleeding ulcer  . Cymbalta [Duloxetine Hcl] Other (See Comments)    REACTION: Urinary retention  . Clarithromycin Other (See Comments)    Upsets stomach  . Nsaids     GI BLEED  . Ranitidine     severe diarrhea  . Venlafaxine     severe depression     Current Outpatient  Medications:  .  albuterol (PROVENTIL HFA;VENTOLIN HFA) 108 (90 Base) MCG/ACT inhaler, Inhale 1-2 puffs into the lungs every 6 (six) hours as needed for wheezing or shortness of breath., Disp: 3 Inhaler, Rfl: 3 .  alendronate (FOSAMAX) 70 MG tablet, TAKE 1 TABLET BY MOUTH EVERY 7 DAYS. TAKE WITH A FULL GLASS OF WATER ON AN EMPTY STOMACH, Disp: 4 tablet, Rfl: 11 .  ARIPiprazole (ABILIFY) 5 MG tablet, Take 5 mg by mouth daily., Disp: , Rfl:  .  busPIRone (BUSPAR) 10 MG tablet, Take 10 mg by mouth 2 (two) times daily. , Disp: , Rfl:  .  Calcium Carbonate-Vitamin D (CALCIUM 600+D) 600-200 MG-UNIT TABS, Take 1 tablet by mouth daily., Disp: , Rfl:  .  citalopram (CELEXA) 40 MG tablet, 40 mg daily. , Disp: , Rfl:  .  colestipol (COLESTID) 1 g tablet, Take by mouth., Disp: , Rfl:  .  donepezil (ARICEPT) 5 MG tablet, Take 5 mg by mouth at bedtime., Disp: , Rfl:  .  fluticasone (FLONASE) 50 MCG/ACT nasal spray, Place 2 sprays into both nostrils daily., Disp: 48 g, Rfl: 4 .  Fluticasone-Salmeterol (ADVAIR DISKUS) 250-50 MCG/DOSE AEPB, Inhale 1 puff into the lungs 2 (two) times daily., Disp: 180 each, Rfl: 3 .  nitrofurantoin, macrocrystal-monohydrate, (MACROBID) 100 MG capsule, , Disp: , Rfl:  .  omeprazole (PRILOSEC) 20 MG capsule, , Disp: , Rfl:  .  primidone (MYSOLINE) 50 MG tablet, Take 50 mg by mouth at bedtime., Disp: , Rfl:  .  propranolol (INDERAL) 20 MG tablet, Take 1 tablet (20 mg total) by mouth 2 (two) times daily., Disp: 180 tablet, Rfl: 3 .  rosuvastatin (CRESTOR)  10 MG tablet, Take 1 tablet (10 mg total) by mouth daily., Disp: 90 tablet, Rfl: 3 .  traZODone (DESYREL) 100 MG tablet, Take 250 mg by mouth at bedtime. , Disp: , Rfl:  .  vitamin B-12 (CYANOCOBALAMIN) 1000 MCG tablet, Take 1,000 mcg by mouth daily., Disp: , Rfl:   Review of Systems  Constitutional: Positive for fatigue. Negative for appetite change, chills and fever.  Eyes: Negative.   Respiratory: Negative for chest tightness  and shortness of breath.   Cardiovascular: Negative for chest pain and palpitations.  Gastrointestinal: Negative for abdominal pain, nausea and vomiting.  Musculoskeletal: Negative.   Skin: Negative.   Allergic/Immunologic: Negative.   Neurological: Positive for tremors. Negative for dizziness and weakness.  Psychiatric/Behavioral: Positive for sleep disturbance.    Social History   Tobacco Use  . Smoking status: Never Smoker  . Smokeless tobacco: Never Used  Substance Use Topics  . Alcohol use: Yes    Alcohol/week: 0.0 standard drinks    Comment: drinks 3-4 times a year      Objective:   BP 111/72 (BP Location: Left Arm, Patient Position: Sitting, Cuff Size: Normal)   Pulse 62   Temp 97.6 F (36.4 C) (Temporal)   Resp 16   Ht 5\' 7"  (1.702 m)   Wt 185 lb 9.6 oz (84.2 kg)   BMI 29.07 kg/m  Vitals:   12/17/19 1428  BP: 111/72  Pulse: 62  Resp: 16  Temp: 97.6 F (36.4 C)  TempSrc: Temporal  Weight: 185 lb 9.6 oz (84.2 kg)  Height: 5\' 7"  (1.702 m)  Body mass index is 29.07 kg/m.   Physical Exam Vitals reviewed.  HENT:     Head: Normocephalic and atraumatic.     Right Ear: External ear normal.     Left Ear: External ear normal.  Eyes:     General: No scleral icterus.    Conjunctiva/sclera: Conjunctivae normal.  Cardiovascular:     Rate and Rhythm: Normal rate and regular rhythm.     Heart sounds: Normal heart sounds.  Pulmonary:     Breath sounds: Normal breath sounds.  Abdominal:     Palpations: Abdomen is soft.  Skin:    General: Skin is warm and dry.  Neurological:     General: No focal deficit present.     Mental Status: She is alert and oriented to person, place, and time.     Comments: Masked facies. Mild hand tremor.  Psychiatric:        Mood and Affect: Mood normal.        Behavior: Behavior normal.        Thought Content: Thought content normal.        Judgment: Judgment normal.      No results found for any visits on 12/17/19.       Assessment & Plan    1. Benign essential tremor Stable.Now seeing personal trainer.  2. Essential hypertension Controlled.RTC 6 months.  3. Mild cognitive impairment with memory loss MMSE next visit  4. B12 deficiency   5. Depression, major, recurrent, in partial remission ([place]) Per Dr Nicolasa Ducking.     Richard Cranford Mon, MD  [facility]

## 2019-12-17 ENCOUNTER — Encounter: Payer: Self-pay | Admitting: Family Medicine

## 2019-12-17 ENCOUNTER — Other Ambulatory Visit: Payer: Self-pay

## 2019-12-17 ENCOUNTER — Ambulatory Visit (INDEPENDENT_AMBULATORY_CARE_PROVIDER_SITE_OTHER): Payer: Medicare Other | Admitting: Family Medicine

## 2019-12-17 VITALS — BP 111/72 | HR 62 | Temp 97.6°F | Resp 16 | Ht 67.0 in | Wt 185.6 lb

## 2019-12-17 DIAGNOSIS — F3341 Major depressive disorder, recurrent, in partial remission: Secondary | ICD-10-CM

## 2019-12-17 DIAGNOSIS — G250 Essential tremor: Secondary | ICD-10-CM

## 2019-12-17 DIAGNOSIS — I10 Essential (primary) hypertension: Secondary | ICD-10-CM

## 2019-12-17 DIAGNOSIS — G3184 Mild cognitive impairment, so stated: Secondary | ICD-10-CM

## 2019-12-17 DIAGNOSIS — E538 Deficiency of other specified B group vitamins: Secondary | ICD-10-CM | POA: Diagnosis not present

## 2019-12-24 ENCOUNTER — Other Ambulatory Visit: Payer: Self-pay

## 2019-12-24 ENCOUNTER — Ambulatory Visit
Admission: RE | Admit: 2019-12-24 | Discharge: 2019-12-24 | Disposition: A | Discharge status: Home / Self Care | Payer: Medicare Other | Source: Ambulatory Visit | Attending: Family Medicine | Admitting: Family Medicine

## 2019-12-24 DIAGNOSIS — Z1231 Encounter for screening mammogram for malignant neoplasm of breast: Secondary | ICD-10-CM

## 2020-01-25 ENCOUNTER — Ambulatory Visit: Payer: Self-pay

## 2020-02-06 ENCOUNTER — Ambulatory Visit: Payer: Medicare Other | Attending: Internal Medicine

## 2020-02-06 DIAGNOSIS — Z23 Encounter for immunization: Secondary | ICD-10-CM | POA: Insufficient documentation

## 2020-02-06 NOTE — Progress Notes (Signed)
   Covid-19 Vaccination Clinic  Name:  Nancy Blackburn    MRN: [id number] DOB: 11-15-1946  02/06/2020  [name] was observed post Covid-19 immunization for 15 minutes without incidence. She was provided with Vaccine Information Sheet and instruction to access the V-Safe system.   [name] was instructed to call 911 with any severe reactions post vaccine: Marland Kitchen Difficulty breathing  . Swelling of your face and throat  . A fast heartbeat  . A bad rash all over your body  . Dizziness and weakness    Immunizations Administered    Name Date Dose VIS Date Route   Pfizer COVID-19 Vaccine 02/06/2020 12:01 PM 0.3 mL 11/22/2019 Intramuscular   Manufacturer: [place]   Lot: [id number]   [place]: [id number]

## 2020-02-07 DIAGNOSIS — M4316 Spondylolisthesis, lumbar region: Secondary | ICD-10-CM | POA: Diagnosis not present

## 2020-02-24 DIAGNOSIS — F411 Generalized anxiety disorder: Secondary | ICD-10-CM | POA: Diagnosis not present

## 2020-02-24 DIAGNOSIS — F331 Major depressive disorder, recurrent, moderate: Secondary | ICD-10-CM | POA: Diagnosis not present

## 2020-02-24 DIAGNOSIS — F5105 Insomnia due to other mental disorder: Secondary | ICD-10-CM | POA: Diagnosis not present

## 2020-03-03 ENCOUNTER — Ambulatory Visit: Payer: Medicare Other | Attending: Internal Medicine

## 2020-03-03 DIAGNOSIS — Z23 Encounter for immunization: Secondary | ICD-10-CM

## 2020-03-03 NOTE — Progress Notes (Signed)
   Covid-19 Vaccination Clinic  Name:  Nancy Blackburn    MRN: [id number] DOB: Jun 23, 1946  03/03/2020  [name] was observed post Covid-19 immunization for 15 minutes without incident. She was provided with Vaccine Information Sheet and instruction to access the V-Safe system.   [name] was instructed to call 911 with any severe reactions post vaccine: Marland Kitchen Difficulty breathing  . Swelling of face and throat  . A fast heartbeat  . A bad rash all over body  . Dizziness and weakness   Immunizations Administered    Name Date Dose VIS Date Route   Pfizer COVID-19 Vaccine 03/03/2020 12:33 PM 0.3 mL 11/22/2019 Intramuscular   Manufacturer: [place]   Lot: [id number]   [place]: [id number]

## 2020-03-09 DIAGNOSIS — H26492 Other secondary cataract, left eye: Secondary | ICD-10-CM | POA: Diagnosis not present

## 2020-04-13 NOTE — Progress Notes (Signed)
 Subjective:   Nancy Blackburn is a 74 y.o. female who presents for Medicare Annual (Subsequent) preventive examination.    This visit is being conducted through telemedicine due to the COVID-19 pandemic. This patient has given me verbal consent via doximity to conduct this visit, patient states they are participating from their home address. Some vital signs may be absent or patient reported.    Patient identification: identified by name, DOB, and current address  Review of Systems:  N/A  Cardiac Risk Factors include: advanced age (>55men, >65 women);dyslipidemia;hypertension     Objective:     Vitals: There were no vitals taken for this visit.  There is no height or weight on file to calculate BMI. Unable to obtain vitals due to visit being conducted via telephonically.   Advanced Directives 04/14/2020 04/18/2019 02/28/2019 01/18/2018 03/13/2017 01/04/2017 02/01/2016  Does Patient Have a Medical Advance Directive? Yes Yes Yes Yes Yes Yes Yes  Type of Advance Directive Healthcare Power of Attorney;Living will Healthcare Power of Attorney Healthcare Power of Attorney;Living will Healthcare Power of Attorney;Living will Healthcare Power of Attorney Healthcare Power of Attorney;Living will Healthcare Power of Attorney;Living will  Does patient want to make changes to medical advance directive? - No - Patient declined - - - - -  Copy of Healthcare Power of Attorney in Chart? Yes - validated most recent copy scanned in chart (See row information) Yes - validated most recent copy scanned in chart (See row information) Yes - validated most recent copy scanned in chart (See row information) Yes - Yes -  Pre-existing out of facility DNR order (yellow form or pink MOST form) - - - - - - -    Tobacco Social History   Tobacco Use  Smoking Status Never Smoker  Smokeless Tobacco Never Used     Counseling given: Not Answered   Clinical Intake:  Pre-visit preparation completed: Yes  Pain :  No/denies pain Pain Score: 0-No pain     Nutritional Risks: None Diabetes: No  How often do you need to have someone help you when you read instructions, pamphlets, or other written materials from your doctor or pharmacy?: 1 - Never  Interpreter Needed?: No  Information entered by :: Mmarkoski, LPN  Past Medical History:  Diagnosis Date  . Chronic sinus infection   . Ejection fraction    a. EF 55-65%, echo, mild LVH, June, 2013, small pericardial effusion  . Fracture, sacrum/coccyx (HCC)   . Hyperlipidemia    a. takes Simvastatin  . Hypertension    a. takes Metoprolol, Lisinopril, and amlodipine.  . Low back pain   . Mild dementia (HCC)   . Nasal polyps   . Normal nuclear stress test    a. 07/2005 Nl nuc stress test.  . Osteopenia   . Osteoporosis   . Palpitations    a. June 2013:  Event recorder normal sinus rhythm with rare PVCs - ? symptomatic PVC's.  . Parkinson disease (HCC)   . Pericardial effusion    a. Small, echo, June, 2013  . PONV (postoperative nausea and vomiting)    Past Surgical History:  Procedure Laterality Date  . BACK SURGERY  08/2010  . BLADDER SUSPENSION  2003  . bone spur  2009   removed from right collar bone area and partial collar bone removed  . THYROIDECTOMY, PARTIAL    . TONSILLECTOMY     as child  . TUBAL LIGATION     38yrs ago  . VAGINAL HYSTERECTOMY       57yr ago d/t cervical cancer   Family History  Problem Relation Age of Onset  . Colon cancer Mother   . Heart murmur Mother   . Heart disease Mother   . Angina Mother   . Heart disease Father   . COPD Father   . Atrial fibrillation Brother   . Cushing syndrome Daughter   . Cancer Daughter        thyroid  . Breast cancer Cousin   . Hypotension Neg Hx   . Malignant hyperthermia Neg Hx   . Pseudochol deficiency Neg Hx    Social History   Socioeconomic History  . Marital status: Divorced    Spouse name: Not on file  . Number of children: 2  . Years of education: Not  on file  . Highest education level: 12th grade  Occupational History  . Occupation: retired  Tobacco Use  . Smoking status: Never Smoker  . Smokeless tobacco: Never Used  Substance and Sexual Activity  . Alcohol use: Yes    Alcohol/week: 0.0 standard drinks    Comment: drinks 3-4 times a year  . Drug use: No  . Sexual activity: Never  Other Topics Concern  . Not on file  Social History Narrative  . Not on file   Social Determinants of Health   Financial Resource Strain: Low Risk   . Difficulty of Paying Living Expenses: Not hard at all  Food Insecurity: No Food Insecurity  . Worried About RCharity fundraiserin the Last Year: Never true  . Ran Out of Food in the Last Year: Never true  Transportation Needs: No Transportation Needs  . Lack of Transportation (Medical): No  . Lack of Transportation (Non-Medical): No  Physical Activity: Insufficiently Active  . Days of Exercise per Week: 2 days  . Minutes of Exercise per Session: 60 min  Stress: No Stress Concern Present  . Feeling of Stress : Not at all  Social Connections: Slightly Isolated  . Frequency of Communication with Friends and Family: More than three times a week  . Frequency of Social Gatherings with Friends and Family: More than three times a week  . Attends Religious Services: More than 4 times per year  . Active Member of Clubs or Organizations: Yes  . Attends CArchivistMeetings: More than 4 times per year  . Marital Status: Divorced    Outpatient Encounter Medications as of 04/14/2020  Medication Sig  . albuterol (PROVENTIL HFA;VENTOLIN HFA) 108 (90 Base) MCG/ACT inhaler Inhale 1-2 puffs into the lungs every 6 (six) hours as needed for wheezing or shortness of breath.  .Marland Kitchenalendronate (FOSAMAX) 70 MG tablet TAKE 1 TABLET BY MOUTH EVERY 7 DAYS. TAKE WITH A FULL GLASS OF WATER ON AN EMPTY STOMACH  . ARIPiprazole (ABILIFY) 5 MG tablet Take 5 mg by mouth daily.  . busPIRone (BUSPAR) 10 MG tablet Take 10  mg by mouth 2 (two) times daily.   . Calcium Carbonate-Vitamin D (CALCIUM 600+D) 600-200 MG-UNIT TABS Take 1 tablet by mouth daily.  . citalopram (CELEXA) 40 MG tablet 40 mg daily.   . colestipol (COLESTID) 1 g tablet Take 1 g by mouth 2 (two) times daily. Up to 3 times a day  . fluticasone (FLONASE) 50 MCG/ACT nasal spray Place 2 sprays into both nostrils daily.  .Marland Kitchenomeprazole (PRILOSEC) 20 MG capsule Take 20 mg by mouth daily.   . primidone (MYSOLINE) 50 MG tablet Take 50 mg by mouth at bedtime.  . propranolol (  INDERAL) 20 MG tablet Take 1 tablet (20 mg total) by mouth 2 (two) times daily.  . rosuvastatin (CRESTOR) 10 MG tablet Take 1 tablet (10 mg total) by mouth daily.  . traZODone (DESYREL) 100 MG tablet Take 200 mg by mouth at bedtime.   . donepezil (ARICEPT) 5 MG tablet Take 5 mg by mouth at bedtime.  . Fluticasone-Salmeterol (ADVAIR DISKUS) 250-50 MCG/DOSE AEPB Inhale 1 puff into the lungs 2 (two) times daily.  . nitrofurantoin, macrocrystal-monohydrate, (MACROBID) 100 MG capsule   . vitamin B-12 (CYANOCOBALAMIN) 1000 MCG tablet Take 1,000 mcg by mouth daily.   No facility-administered encounter medications on file as of 04/14/2020.    Activities of Daily Living In your present state of health, do you have any difficulty performing the following activities: 04/14/2020 04/18/2019  Hearing? N -  Vision? N N  Difficulty concentrating or making decisions? N Y  Walking or climbing stairs? Y Y  Comment Hard to climb up stairs due to left leg. -  Dressing or bathing? N N  Doing errands, shopping? N N  Preparing Food and eating ? N N  Using the Toilet? N N  In the past six months, have you accidently leaked urine? Y -  Comment Due to previous bladder sx. -  Do you have problems with loss of bowel control? N N  Managing your Medications? N N  Managing your Finances? N N  Housekeeping or managing your Housekeeping? Y N  Comment Has a assistance cleaning home. -  Some recent data might be  hidden    Patient Care Team: Gilbert, Richard L Jr., MD as PCP - General (Unknown Physician Specialty) Burnette, Jennifer M, PA-C as Physician Assistant (Family Medicine) Porfilio, William, MD as Referring Physician (Ophthalmology) Shah, Hemang K, MD as Consulting Physician (Neurology) MacDiarmid, Scott, MD as Consulting Physician (Urology) Jenkins, Jeffrey, MD as Consulting Physician (Neurosurgery) Kapur, Aarti K, MD as Referring Physician (Psychiatry) Medoff, Jeffrey, MD as Consulting Physician (Gastroenterology)    Assessment:   This is a routine wellness examination for Momina.  Exercise Activities and Dietary recommendations Current Exercise Habits: Structured exercise class, Type of exercise: strength training/weights;stretching, Time (Minutes): 60, Frequency (Times/Week): 2, Weekly Exercise (Minutes/Week): 120, Intensity: Mild, Exercise limited by: None identified  Goals      Patient Stated   . I really need to get my cholesterol down (pt-stated)     Current Barriers:  . Knowledge Deficits related to understanding hypercholesterolemia and self care . Non-adherence to prescribed medication regimen  Nurse Case Manager Clinical Goal(s):  . Over the next 30 days, patient will adhere to plan of care estalished to reduce cholesterol through lifestyle modifications including daily exercise/activity, cholesterol medication adherence, and reduction in high fat foods-goal met 07/23/2019 . .Over the next 90 days, patient will adhere to plan of care estalished to reduce cholesterol through lifestyle modifications including daily exercise/activity, cholesterol medication adherence, and reduction in high fat foods  Interventions:  . Assessed for progression towards goal of diet change and daily activity . Assessed for statin compliance and side effects . Discuss recent knee injections and ability to walk without discomfort . Reviewed upcoming appointment with PCP 11/9//2020   Patient  Self Care Activities:  . Self administers medications as prescribed . Attends all scheduled provider appointments . Calls pharmacy for medication refills . Calls provider office for new concerns or questions  . Follows lipid lowering diet and exercise as tolerated  Please see past updates related to this goal by clicking   on the "Past Updates" button in the selected goal      . My blood pressure has been running high lately (pt-stated)     Current Barriers:  . Knowledge Deficits related to basic understanding of hypertension pathophysiology and self care management . Lacks caregiver support.    Nurse Case Manager Clinical Goal(s):  . Over the next 30 days, patient will continue to demonstrate improved health management independence as evidenced by checking blood pressure as directed and notifying PCP if SBP>180or DBP > 100, taking all medications as prescribe, and adhering to a low sodium diet as discussed-goal met 07/23/2019 . Over the next 90 days, patient will continue to demonstrate improved health management independence as evidenced by checking blood pressure as directed and notifying PCP if SBP>180or DBP > 100, taking all medications as prescribe, and adhering to a low sodium diet as discussed  Interventions:  . Evaluation of current treatment plan related to hypertension self management and patient's adherence to plan as established by provider. . Reviewed BP log . Discussed heat and humidity's effects on generalized well being and energy levels . Discussed plans with patient for ongoing care management follow up and provided patient with direct contact information for care management team . Advised patient, providing education and rationale, to monitor blood pressure daily and record, calling PCP for findings outside established parameters.  . Reviewed scheduled/upcoming provider appointments including: PCP follow up 10/21/2019  Patient Self Care Activities:  . Self administers  medications as prescribed . Attends all scheduled provider appointments . Calls provider office for new concerns, questions, or BP outside discussed parameters . Checks BP and records as discussed . Follows a low sodium diet/DASH diet  Please see past updates related to this goal by clicking on the "Past Updates" button in the selected goal         Other   . DIET - INCREASE WATER INTAKE     Recommend increasing water intake to 4-6 glasses a day.        Fall Risk: Fall Risk  04/14/2020 12/17/2019 04/18/2019 04/10/2019 03/12/2019  Falls in the past year? 0 0 0 0 0  Number falls in past yr: 0 0 - - -  Injury with Fall? 0 0 - - -    FALL RISK PREVENTION PERTAINING TO THE HOME:  Any stairs in or around the home? Yes  If so, are there any without handrails? No   Home free of loose throw rugs in walkways, pet beds, electrical cords, etc? Yes  Adequate lighting in your home to reduce risk of falls? Yes   ASSISTIVE DEVICES UTILIZED TO PREVENT FALLS:  Life alert? No  Use of a cane, walker or w/c? No  Grab bars in the bathroom? Yes  Shower chair or bench in shower? No  Elevated toilet seat or a handicapped toilet? Yes    TIMED UP AND GO:  Was the test performed? No .    Depression Screen PHQ 2/9 Scores 12/17/2019 04/10/2019 04/10/2019 03/12/2019  PHQ - 2 Score 0 1 1 0  PHQ- 9 Score 4 3 3 -     Cognitive Function MMSE - Mini Mental State Exam 08/01/2016  Orientation to time 5  Orientation to Place 5  Registration 3  Attention/ Calculation 5  Recall 2  Language- name 2 objects 2  Language- repeat 1  Language- follow 3 step command 3  Language- read & follow direction 1  Write a sentence 1  Copy design 1  Total   score 29     6CIT Screen 02/28/2019 01/04/2017  What Year? 0 points 0 points  What month? 0 points 0 points  What time? 0 points 3 points  Count back from 20 0 points 0 points  Months in reverse 0 points 0 points  Repeat phrase 0 points 0 points  Total Score 0 3     Immunization History  Administered Date(s) Administered  . Fluad Quad(high Dose 65+) 10/21/2019  . Influenza Inj Mdck Quad Pf 10/27/2016  . Influenza Split 10/05/2011, 10/10/2012  . Influenza, High Dose Seasonal PF 09/04/2014, 09/01/2015, 10/05/2017, 10/10/2018  . Influenza-Unspecified 10/12/2013, 08/12/2014, 09/04/2014  . PFIZER SARS-COV-2 Vaccination 02/06/2020, 03/03/2020  . Pneumococcal Conjugate-13 03/26/2015  . Pneumococcal Polysaccharide-23 10/11/1999, 04/10/2012  . Td 08/23/1999  . Tdap 05/20/2019  . Tetanus 05/28/2019  . Zoster 09/26/2008    Qualifies for Shingles Vaccine? Yes  Zostavax completed 09/26/08. Due for Shingrix. Pt has been advised to call insurance company to determine out of pocket expense. Advised may also receive vaccine at local pharmacy or Health Dept. Verbalized acceptance and understanding.  Tdap: Up to date  Flu Vaccine: Up to date  Pneumococcal Vaccine: Completed series  Screening Tests Health Maintenance  Topic Date Due  . INFLUENZA VACCINE  07/12/2020  . MAMMOGRAM  12/23/2021  . DEXA SCAN  02/07/2023  . COLONOSCOPY  02/23/2023  . TETANUS/TDAP  05/27/2029  . COVID-19 Vaccine  Completed  . Hepatitis C Screening  Completed  . PNA vac Low Risk Adult  Completed    Cancer Screenings:  Colorectal Screening: Completed 02/22/18. Repeat every 5 years.   Mammogram: Completed 12/24/19. Repeat every 1-2 years as advised.  Bone Density: Completed 02/07/18. Results reflect OSTEOPENIA. Repeat every 5 years.   Lung Cancer Screening: (Low Dose CT Chest recommended if Age 55-80 years, 30 pack-year currently smoking OR have quit w/in 15years.) does not qualify.   Additional Screening:  Hepatitis C Screening: Up to date  Vision Screening: Recommended annual ophthalmology exams for early detection of glaucoma and other disorders of the eye.  Dental Screening: Recommended annual dental exams for proper oral hygiene  Community Resource Referral:  CRR  required this visit?  No       Plan:  I have personally reviewed and addressed the Medicare Annual Wellness questionnaire and have noted the following in the patient's chart:  A. Medical and social history B. Use of alcohol, tobacco or illicit drugs  C. Current medications and supplements D. Functional ability and status E.  Nutritional status F.  Physical activity G. Advance directives H. List of other physicians I.  Hospitalizations, surgeries, and ER visits in previous 12 months J.  Vitals K. Screenings such as hearing and vision if needed, cognitive and depression L. Referrals and appointments   In addition, I have reviewed and discussed with patient certain preventive protocols, quality metrics, and best practice recommendations. A written personalized care plan for preventive services as well as general preventive health recommendations were provided to patient. Nurse Health Advisor  Signed,     , LPN  04/14/2020 Nurse Health Advisor   Nurse Notes: None.   

## 2020-04-14 ENCOUNTER — Other Ambulatory Visit: Payer: Self-pay

## 2020-04-14 ENCOUNTER — Ambulatory Visit (INDEPENDENT_AMBULATORY_CARE_PROVIDER_SITE_OTHER): Payer: Medicare Other

## 2020-04-14 DIAGNOSIS — Z0000 Encounter for general adult medical examination without abnormal findings: Secondary | ICD-10-CM

## 2020-04-14 NOTE — Patient Instructions (Signed)
Nancy Blackburn , Thank you for taking time to come for your Medicare Wellness Visit. I appreciate your ongoing commitment to your health goals. Please review the following plan we discussed and let me know if I can assist you in the future.   Screening recommendations/referrals: Colonoscopy: Up to date, due 02/2023 Mammogram: Up to date, due 12/2021 Bone Density: Up to date, due 01/2023 Recommended yearly ophthalmology/optometry visit for glaucoma screening and checkup Recommended yearly dental visit for hygiene and checkup  Vaccinations: Influenza vaccine: Up to date Pneumococcal vaccine: Completed series Tdap vaccine: Up to date Shingles vaccine: Pt declines today.     Advanced directives: Currently on file  Conditions/risks identified: Recommend increasing water intake to 6-8 8 oz glasses a day.  Next appointment: 06/18/20 @ 2:20 PM with Dr Rosanna Randy. Declined scheduling an AWV for 2022 at this time.   Preventive Care 20 Years and Older, Female Preventive care refers to lifestyle choices and visits with your health care provider that can promote health and wellness. What does preventive care include?  A yearly physical exam. This is also called an annual well check.  Dental exams once or twice a year.  Routine eye exams. Ask your health care provider how often you should have your eyes checked.  Personal lifestyle choices, including:  Daily care of your teeth and gums.  Regular physical activity.  Eating a healthy diet.  Avoiding tobacco and drug use.  Limiting alcohol use.  Practicing safe sex.  Taking low-dose aspirin every day.  Taking vitamin and mineral supplements as recommended by your health care provider. What happens during an annual well check? The services and screenings done by your health care provider during your annual well check will depend on your age, overall health, lifestyle risk factors, and family history of disease. Counseling  Your health  care provider may ask you questions about your:  Alcohol use.  Tobacco use.  Drug use.  Emotional well-being.  Home and relationship well-being.  Sexual activity.  Eating habits.  History of falls.  Memory and ability to understand (cognition).  Work and work Statistician.  Reproductive health. Screening  You may have the following tests or measurements:  Height, weight, and BMI.  Blood pressure.  Lipid and cholesterol levels. These may be checked every 5 years, or more frequently if you are over 11 years old.  Skin check.  Lung cancer screening. You may have this screening every year starting at age 58 if you have a 30-pack-year history of smoking and currently smoke or have quit within the past 15 years.  Fecal occult blood test (FOBT) of the stool. You may have this test every year starting at age 63.  Flexible sigmoidoscopy or colonoscopy. You may have a sigmoidoscopy every 5 years or a colonoscopy every 10 years starting at age 65.  Hepatitis C blood test.  Hepatitis B blood test.  Sexually transmitted disease (STD) testing.  Diabetes screening. This is done by checking your blood sugar (glucose) after you have not eaten for a while (fasting). You may have this done every 1-3 years.  Bone density scan. This is done to screen for osteoporosis. You may have this done starting at age 67.  Mammogram. This may be done every 1-2 years. Talk to your health care provider about how often you should have regular mammograms. Talk with your health care provider about your test results, treatment options, and if necessary, the need for more tests. Vaccines  Your health care provider may recommend  certain vaccines, such as:  Influenza vaccine. This is recommended every year.  Tetanus, diphtheria, and acellular pertussis (Tdap, Td) vaccine. You may need a Td booster every 10 years.  Zoster vaccine. You may need this after age 52.  Pneumococcal 13-valent conjugate  (PCV13) vaccine. One dose is recommended after age 56.  Pneumococcal polysaccharide (PPSV23) vaccine. One dose is recommended after age 34. Talk to your health care provider about which screenings and vaccines you need and how often you need them. This information is not intended to replace advice given to you by your health care provider. Make sure you discuss any questions you have with your health care provider. Document Released: 12/25/2015 Document Revised: 08/17/2016 Document Reviewed: 09/29/2015 Elsevier Interactive Patient Education  2017 [employer] Prevention in the Home Falls can cause injuries. They can happen to people of all ages. There are many things you can do to make your home safe and to help prevent falls. What can I do on the outside of my home?  Regularly fix the edges of walkways and driveways and fix any cracks.  Remove anything that might make you trip as you walk through a door, such as a raised step or threshold.  Trim any bushes or trees on the path to your home.  Use bright outdoor lighting.  Clear any walking paths of anything that might make someone trip, such as rocks or tools.  Regularly check to see if handrails are loose or broken. Make sure that both sides of any steps have handrails.  Any raised decks and porches should have guardrails on the edges.  Have any leaves, snow, or ice cleared regularly.  Use sand or salt on walking paths during winter.  Clean up any spills in your garage right away. This includes oil or grease spills. What can I do in the bathroom?  Use night lights.  Install grab bars by the toilet and in the tub and shower. Do not use towel bars as grab bars.  Use non-skid mats or decals in the tub or shower.  If you need to sit down in the shower, use a plastic, non-slip stool.  Keep the floor dry. Clean up any water that spills on the floor as soon as it happens.  Remove soap buildup in the tub or shower  regularly.  Attach bath mats securely with double-sided non-slip rug tape.  Do not have throw rugs and other things on the floor that can make you trip. What can I do in the bedroom?  Use night lights.  Make sure that you have a light by your bed that is easy to reach.  Do not use any sheets or blankets that are too big for your bed. They should not hang down onto the floor.  Have a firm chair that has side arms. You can use this for support while you get dressed.  Do not have throw rugs and other things on the floor that can make you trip. What can I do in the kitchen?  Clean up any spills right away.  Avoid walking on wet floors.  Keep items that you use a lot in easy-to-reach places.  If you need to reach something above you, use a strong step stool that has a grab bar.  Keep electrical cords out of the way.  Do not use floor polish or wax that makes floors slippery. If you must use wax, use non-skid floor wax.  Do not have throw rugs and other  things on the floor that can make you trip. What can I do with my stairs?  Do not leave any items on the stairs.  Make sure that there are handrails on both sides of the stairs and use them. Fix handrails that are broken or loose. Make sure that handrails are as long as the stairways.  Check any carpeting to make sure that it is firmly attached to the stairs. Fix any carpet that is loose or worn.  Avoid having throw rugs at the top or bottom of the stairs. If you do have throw rugs, attach them to the floor with carpet tape.  Make sure that you have a light switch at the top of the stairs and the bottom of the stairs. If you do not have them, ask someone to add them for you. What else can I do to help prevent falls?  Wear shoes that:  Do not have high heels.  Have rubber bottoms.  Are comfortable and fit you well.  Are closed at the toe. Do not wear sandals.  If you use a stepladder:  Make sure that it is fully  opened. Do not climb a closed stepladder.  Make sure that both sides of the stepladder are locked into place.  Ask someone to hold it for you, if possible.  Clearly mark and make sure that you can see:  Any grab bars or handrails.  First and last steps.  Where the edge of each step is.  Use tools that help you move around (mobility aids) if they are needed. These include:  Canes.  Walkers.  Scooters.  Crutches.  Turn on the lights when you go into a dark area. Replace any light bulbs as soon as they burn out.  Set up your furniture so you have a clear path. Avoid moving your furniture around.  If any of your floors are uneven, fix them.  If there are any pets around you, be aware of where they are.  Review your medicines with your doctor. Some medicines can make you feel dizzy. This can increase your chance of falling. Ask your doctor what other things that you can do to help prevent falls. This information is not intended to replace advice given to you by your health care provider. Make sure you discuss any questions you have with your health care provider. Document Released: 09/24/2009 Document Revised: 05/05/2016 Document Reviewed: 01/02/2015 Elsevier Interactive Patient Education  2017 [employer].

## 2020-04-21 DIAGNOSIS — R259 Unspecified abnormal involuntary movements: Secondary | ICD-10-CM | POA: Diagnosis not present

## 2020-04-21 DIAGNOSIS — G3184 Mild cognitive impairment, so stated: Secondary | ICD-10-CM | POA: Diagnosis not present

## 2020-04-21 DIAGNOSIS — E538 Deficiency of other specified B group vitamins: Secondary | ICD-10-CM | POA: Diagnosis not present

## 2020-04-21 DIAGNOSIS — G250 Essential tremor: Secondary | ICD-10-CM | POA: Diagnosis not present

## 2020-04-21 DIAGNOSIS — R2681 Unsteadiness on feet: Secondary | ICD-10-CM | POA: Diagnosis not present

## 2020-04-21 DIAGNOSIS — E559 Vitamin D deficiency, unspecified: Secondary | ICD-10-CM | POA: Diagnosis not present

## 2020-05-26 ENCOUNTER — Telehealth: Payer: Self-pay | Admitting: Family Medicine

## 2020-05-26 DIAGNOSIS — F411 Generalized anxiety disorder: Secondary | ICD-10-CM | POA: Diagnosis not present

## 2020-05-26 DIAGNOSIS — F331 Major depressive disorder, recurrent, moderate: Secondary | ICD-10-CM | POA: Diagnosis not present

## 2020-05-26 DIAGNOSIS — F5105 Insomnia due to other mental disorder: Secondary | ICD-10-CM | POA: Diagnosis not present

## 2020-05-26 NOTE — Chronic Care Management (AMB) (Signed)
  Care Management   Note  05/26/2020 Name: Nancy Blackburn MRN: [id number] DOB: [birth year]  Nancy Blackburn is a 74 y.o. year old female who is a primary care patient of Jerrol Banana., MD and is actively engaged with the care management team. I reached out to [name] by phone today to assist with scheduling an initial visit with the RN Case Manager  Follow up plan: Telephone appointment with care management team member scheduled for:06/30/2020  Noreene Larsson, [facility], [facility]  [place], [place] [address] Direct Dial: [phone] [email or web address]

## 2020-06-18 ENCOUNTER — Ambulatory Visit: Payer: Self-pay | Admitting: Family Medicine

## 2020-06-29 ENCOUNTER — Ambulatory Visit: Payer: Self-pay

## 2020-06-29 NOTE — Telephone Encounter (Signed)
Pt. Reports her BP "has been running low for me." BP today 143/55, pulse 60. No chest pain or shortness of breath. States she passed out last Thursday in the beauty shop. No injury. Requests to see Dr. Rosanna Randy only. Appointment made for Wednesday. Instructed if she passes out again to go to ED. Verbalizes understanding.   Reason for Disposition . [0] Systolic BP 34-742 AND [5] taking blood pressure medications AND [3] NOT dizzy, lightheaded or weak  Answer Assessment - Initial Assessment Questions 1. BLOOD PRESSURE: "What is the blood pressure?" "Did you take at least two measurements 5 minutes apart?"     143/55 2. ONSET: "When did you take your blood pressure?"     Today 3. HOW: "How did you obtain the blood pressure?" (e.g., visiting nurse, automatic home BP monitor)     Home monitor 4. HISTORY: "Do you have a history of low blood pressure?" "What is your blood pressure normally?"     Yes 5. MEDICATIONS: "Are you taking any medications for blood pressure?" If Yes, ask: "Have they been changed recently?"     Held it Saturday 6. PULSE RATE: "Do you know what your pulse rate is?"      60 7. OTHER SYMPTOMS: "Have you been sick recently?" "Have you had a recent injury?"     No 8. PREGNANCY: "Is there any chance you are pregnant?" "When was your last menstrual period?"     No  Protocols used: BLOOD PRESSURE - LOW-A-AH

## 2020-06-29 NOTE — Progress Notes (Signed)
Trena Platt Cummings,acting as a scribe for Wilhemena Durie, MD.,have documented all relevant documentation on the behalf of Wilhemena Durie, MD,as directed by  Wilhemena Durie, MD while in the presence of Wilhemena Durie, MD.   Established patient visit   [name]   DOB: Sep 30, 1946   74 y.o. Female  MRN: [id number] Visit Date: 07/01/2020  Today's healthcare provider: Wilhemena Durie, MD   Chief Complaint  Patient presents with  . Blood Pressure   Subjective    HPI   Patient presents today in office because her blood pressure has been running low for the past 5 days. Patient has not had any chest pain or shortness of breath. Patient also passed out last Thursday at the hair salon. Patient says that she has also had some episodes where she almost passed out which happen daily. She says she has also been trying to drink plenty of fluids.  She was essentially in her normal state of health until 5 days ago.  Her normal state is well but she has had no syncope or presyncope like this in the past.  She states she just feels bad today.     Medications: Outpatient Medications Prior to Visit  Medication Sig  . busPIRone (BUSPAR) 10 MG tablet Take 10 mg by mouth 2 (two) times daily.   . colestipol (COLESTID) 1 g tablet Take 1 g by mouth 2 (two) times daily. Up to 3 times a day  . donepezil (ARICEPT) 5 MG tablet Take 5 mg by mouth at bedtime.  . nitrofurantoin, macrocrystal-monohydrate, (MACROBID) 100 MG capsule   . primidone (MYSOLINE) 50 MG tablet Take 50 mg by mouth at bedtime.  . propranolol (INDERAL) 20 MG tablet Take 1 tablet (20 mg total) by mouth 2 (two) times daily.  . rosuvastatin (CRESTOR) 10 MG tablet Take 1 tablet (10 mg total) by mouth daily.  . traZODone (DESYREL) 100 MG tablet Take 200 mg by mouth at bedtime.   . vitamin B-12 (CYANOCOBALAMIN) 1000 MCG tablet Take 1,000 mcg by mouth daily.  Marland Kitchen albuterol (PROVENTIL HFA;VENTOLIN HFA) 108 (90  Base) MCG/ACT inhaler Inhale 1-2 puffs into the lungs every 6 (six) hours as needed for wheezing or shortness of breath. (Patient not taking: Reported on 07/01/2020)  . alendronate (FOSAMAX) 70 MG tablet TAKE 1 TABLET BY MOUTH EVERY 7 DAYS. TAKE WITH A FULL GLASS OF WATER ON AN EMPTY STOMACH (Patient not taking: Reported on 07/01/2020)  . ARIPiprazole (ABILIFY) 5 MG tablet Take 5 mg by mouth daily. (Patient not taking: Reported on 07/01/2020)  . Calcium Carbonate-Vitamin D (CALCIUM 600+D) 600-200 MG-UNIT TABS Take 1 tablet by mouth daily. (Patient not taking: Reported on 07/01/2020)  . citalopram (CELEXA) 40 MG tablet 40 mg daily.  (Patient not taking: Reported on 07/01/2020)  . fluticasone (FLONASE) 50 MCG/ACT nasal spray Place 2 sprays into both nostrils daily. (Patient not taking: Reported on 07/01/2020)  . Fluticasone-Salmeterol (ADVAIR DISKUS) 250-50 MCG/DOSE AEPB Inhale 1 puff into the lungs 2 (two) times daily.  Marland Kitchen omeprazole (PRILOSEC) 20 MG capsule Take 20 mg by mouth daily.  (Patient not taking: Reported on 07/01/2020)   No facility-administered medications prior to visit.    Review of Systems  Constitutional: Negative for appetite change, chills, fatigue and fever.  Eyes: Negative.   Respiratory: Negative for chest tightness and shortness of breath.   Cardiovascular: Negative for chest pain and palpitations.  Gastrointestinal: Negative for abdominal pain, nausea and vomiting.  Allergic/Immunologic: Negative.   Neurological: Positive for tremors and syncope. Negative for dizziness and weakness.  Psychiatric/Behavioral: Positive for dysphoric mood. The patient is nervous/anxious.        Objective    BP (!) 91/59 (BP Location: Right Arm, Patient Position: Sitting, Cuff Size: Normal)   Pulse (!) 38   Temp (!) 96.6 F (35.9 C) (Temporal)   Ht 5\' 7"  (1.702 m)   Wt 174 lb 3.2 oz (79 kg)   BMI 27.28 kg/m     Physical Exam Vitals reviewed.  Constitutional:      Appearance: Normal  appearance. She is well-developed.  HENT:     Head: Normocephalic and atraumatic.     Right Ear: External ear normal.     Left Ear: External ear normal.     Nose: Nose normal.  Eyes:     General: No scleral icterus.    Conjunctiva/sclera: Conjunctivae normal.  Neck:     Thyroid: No thyromegaly.  Cardiovascular:     Rate and Rhythm: Normal rate and regular rhythm.     Heart sounds: Normal heart sounds.  Pulmonary:     Effort: Pulmonary effort is normal.     Breath sounds: Normal breath sounds.  Chest:     Breasts: Breasts are symmetrical.        Right: Normal.        Left: Normal.  Abdominal:     Palpations: Abdomen is soft.  Skin:    General: Skin is warm and dry.  Neurological:     Mental Status: She is alert and oriented to person, place, and time. Mental status is at baseline.     Comments: Masked facies. Mild Shuffling gait.  Tremor of both arms.  Psychiatric:        Mood and Affect: Mood normal.        Behavior: Behavior normal.        Thought Content: Thought content normal.        Judgment: Judgment normal.     ECG reveals heart rate of about 38.  It appears to be a junctional rhythm.  There is a lot of artifact but no P waves are appreciated.  No results found for any visits on 07/01/20.  Assessment & Plan     1. Syncope, unspecified syncope type Discussed with cardiology and they are in agreement that she needs to be admitted.  Have contacted the ED and will send her there for admission. - EKG 12-Lead  2. Bradycardia Need to stop propanolol.  3. Parkinson's disease ([place]) Parkinsonism followed by neurology.  Stop propanolol as of today.  4. Weakness generalized Due to hypotension and syncope.  IV fluids may help.  5. Depression, major, recurrent, in partial remission ([place]) Followed by psychiatry.   No follow-ups on file.      I, Wilhemena Durie, MD, have reviewed all documentation for this visit. The documentation on 07/02/20 for the exam,  diagnosis, procedures, and orders are all accurate and complete.    Chalonda Schlatter Cranford Mon, MD  [facility] [phone] (phone) [phone] (fax)  [place]

## 2020-06-30 ENCOUNTER — Ambulatory Visit: Payer: Self-pay

## 2020-06-30 ENCOUNTER — Ambulatory Visit: Payer: Self-pay | Admitting: Family Medicine

## 2020-06-30 ENCOUNTER — Telehealth: Payer: Medicare Other

## 2020-06-30 NOTE — Chronic Care Management (AMB) (Signed)
  Chronic Care Management   Outreach Note  06/30/2020 Name: Nancy Blackburn MRN: [id number] DOB: 1946/03/19  Primary Care Provider: Jerrol Banana., MD Reason for referral : Chronic Care Provider   An unsuccessful telephone outreach was attempted today. [name] was previously engaged with the chronic care management team.  Phone rang multiple times without option to leave a voice message.    Follow Up Plan: The care management team will reach out again within the next two weeks.    Horris Latino [facility] Care Management [phone]

## 2020-07-01 ENCOUNTER — Ambulatory Visit (INDEPENDENT_AMBULATORY_CARE_PROVIDER_SITE_OTHER): Payer: Medicare Other | Admitting: Family Medicine

## 2020-07-01 ENCOUNTER — Encounter: Payer: Self-pay | Admitting: Family Medicine

## 2020-07-01 ENCOUNTER — Observation Stay
Admission: EM | Admit: 2020-07-01 | Discharge: 2020-07-03 | Disposition: A | Discharge status: Home / Self Care | Payer: Medicare Other | Attending: Internal Medicine | Admitting: Internal Medicine

## 2020-07-01 ENCOUNTER — Emergency Department: Payer: Medicare Other

## 2020-07-01 ENCOUNTER — Other Ambulatory Visit: Payer: Self-pay

## 2020-07-01 ENCOUNTER — Encounter: Payer: Self-pay | Admitting: *Deleted

## 2020-07-01 VITALS — BP 91/59 | HR 38 | Temp 96.6°F | Ht 67.0 in | Wt 174.2 lb

## 2020-07-01 DIAGNOSIS — F3341 Major depressive disorder, recurrent, in partial remission: Secondary | ICD-10-CM | POA: Diagnosis not present

## 2020-07-01 DIAGNOSIS — G20 Parkinson's disease: Secondary | ICD-10-CM

## 2020-07-01 DIAGNOSIS — Z20822 Contact with and (suspected) exposure to covid-19: Secondary | ICD-10-CM | POA: Diagnosis not present

## 2020-07-01 DIAGNOSIS — R55 Syncope and collapse: Secondary | ICD-10-CM | POA: Insufficient documentation

## 2020-07-01 DIAGNOSIS — I959 Hypotension, unspecified: Secondary | ICD-10-CM | POA: Diagnosis not present

## 2020-07-01 DIAGNOSIS — R001 Bradycardia, unspecified: Principal | ICD-10-CM | POA: Diagnosis present

## 2020-07-01 DIAGNOSIS — I10 Essential (primary) hypertension: Secondary | ICD-10-CM | POA: Insufficient documentation

## 2020-07-01 DIAGNOSIS — R531 Weakness: Secondary | ICD-10-CM | POA: Diagnosis not present

## 2020-07-01 DIAGNOSIS — R42 Dizziness and giddiness: Secondary | ICD-10-CM | POA: Diagnosis not present

## 2020-07-01 DIAGNOSIS — Z79899 Other long term (current) drug therapy: Secondary | ICD-10-CM | POA: Diagnosis not present

## 2020-07-01 LAB — CBC
HCT: 43.1 % (ref 36.0–46.0)
Hemoglobin: 14.5 g/dL (ref 12.0–15.0)
MCH: 30 pg (ref 26.0–34.0)
MCHC: 33.6 g/dL (ref 30.0–36.0)
MCV: 89.2 fL (ref 80.0–100.0)
Platelets: 265 10*3/uL (ref 150–400)
RBC: 4.83 MIL/uL (ref 3.87–5.11)
RDW: 11.9 % (ref 11.5–15.5)
WBC: 9 10*3/uL (ref 4.0–10.5)
nRBC: 0 % (ref 0.0–0.2)

## 2020-07-01 LAB — BASIC METABOLIC PANEL
Anion gap: 13 (ref 5–15)
BUN: 16 mg/dL (ref 8–23)
CO2: 24 mmol/L (ref 22–32)
Calcium: 9.1 mg/dL (ref 8.9–10.3)
Chloride: 98 mmol/L (ref 98–111)
Creatinine, Ser: 1.4 mg/dL — ABNORMAL HIGH (ref 0.44–1.00)
GFR calc Af Amer: 43 mL/min — ABNORMAL LOW (ref >60)
GFR calc non Af Amer: 37 mL/min — ABNORMAL LOW (ref >60)
Glucose, Bld: 90 mg/dL (ref 70–99)
Potassium: 4.4 mmol/L (ref 3.5–5.1)
Sodium: 135 mmol/L (ref 135–145)

## 2020-07-01 LAB — TROPONIN I (HIGH SENSITIVITY): Troponin I (High Sensitivity): 4 ng/L (ref <18)

## 2020-07-01 MED ORDER — MAGNESIUM HYDROXIDE 400 MG/5ML PO SUSP
30.0000 mL | Freq: Every day | ORAL | Status: DC | PRN
  Filled 2020-07-01: qty 30

## 2020-07-01 MED ORDER — ROSUVASTATIN CALCIUM 10 MG PO TABS
10.0000 mg | ORAL_TABLET | Freq: Every day | ORAL | Status: DC
  Administered 2020-07-02 – 2020-07-03 (×2): 10 mg via ORAL
  Filled 2020-07-01 (×3): qty 1

## 2020-07-01 MED ORDER — ONDANSETRON HCL 4 MG PO TABS: 4.0000 mg | ORAL_TABLET | Freq: Four times a day (QID) | ORAL | Status: DC | PRN

## 2020-07-01 MED ORDER — TRAZODONE HCL 50 MG PO TABS
200.0000 mg | ORAL_TABLET | Freq: Every day | ORAL | Status: DC
  Administered 2020-07-02 (×2): 200 mg via ORAL
  Filled 2020-07-01: qty 4
  Filled 2020-07-01: qty 2

## 2020-07-01 MED ORDER — BUSPIRONE HCL 10 MG PO TABS
10.0000 mg | ORAL_TABLET | Freq: Two times a day (BID) | ORAL | Status: DC
  Administered 2020-07-02 – 2020-07-03 (×3): 10 mg via ORAL
  Filled 2020-07-01: qty 2
  Filled 2020-07-01 (×3): qty 1

## 2020-07-01 MED ORDER — SODIUM CHLORIDE 0.9% FLUSH
3.0000 mL | Freq: Two times a day (BID) | INTRAVENOUS | Status: DC
  Administered 2020-07-01 – 2020-07-03 (×4): 3 mL via INTRAVENOUS

## 2020-07-01 MED ORDER — TRAZODONE HCL 50 MG PO TABS: 25.0000 mg | ORAL_TABLET | Freq: Every evening | ORAL | Status: DC | PRN

## 2020-07-01 MED ORDER — DONEPEZIL HCL 5 MG PO TABS: 5.0000 mg | ORAL_TABLET | Freq: Every day | ORAL | Status: DC

## 2020-07-01 MED ORDER — ACETAMINOPHEN 325 MG PO TABS: 650.0000 mg | ORAL_TABLET | Freq: Four times a day (QID) | ORAL | Status: DC | PRN

## 2020-07-01 MED ORDER — PRIMIDONE 50 MG PO TABS
50.0000 mg | ORAL_TABLET | Freq: Every day | ORAL | Status: DC
  Administered 2020-07-02 (×2): 50 mg via ORAL
  Filled 2020-07-01 (×4): qty 1

## 2020-07-01 MED ORDER — ALUM & MAG HYDROXIDE-SIMETH 200-200-20 MG/5ML PO SUSP: 30.0000 mL | Freq: Four times a day (QID) | ORAL | Status: DC | PRN

## 2020-07-01 MED ORDER — ENOXAPARIN SODIUM 40 MG/0.4ML ~~LOC~~ SOLN
40.0000 mg | SUBCUTANEOUS | Status: DC
  Administered 2020-07-02 – 2020-07-03 (×2): 40 mg via SUBCUTANEOUS
  Filled 2020-07-01 (×2): qty 0.4

## 2020-07-01 MED ORDER — COLESTIPOL HCL 1 G PO TABS
1.0000 g | ORAL_TABLET | Freq: Two times a day (BID) | ORAL | Status: DC
  Administered 2020-07-02 – 2020-07-03 (×4): 1 g via ORAL
  Filled 2020-07-01 (×6): qty 1

## 2020-07-01 MED ORDER — VITAMIN B-12 1000 MCG PO TABS
1000.0000 ug | ORAL_TABLET | Freq: Every day | ORAL | Status: DC
  Administered 2020-07-03: 1000 ug via ORAL
  Filled 2020-07-01 (×2): qty 1

## 2020-07-01 MED ORDER — PROPRANOLOL HCL 20 MG PO TABS: 20.0000 mg | ORAL_TABLET | Freq: Two times a day (BID) | ORAL | Status: DC

## 2020-07-01 MED ORDER — SODIUM CHLORIDE 0.9% FLUSH
3.0000 mL | Freq: Once | INTRAVENOUS | Status: AC
  Administered 2020-07-01: 3 mL via INTRAVENOUS

## 2020-07-01 MED ORDER — ACETAMINOPHEN 650 MG RE SUPP: 650.0000 mg | Freq: Four times a day (QID) | RECTAL | Status: DC | PRN

## 2020-07-01 MED ORDER — ONDANSETRON HCL 4 MG/2ML IJ SOLN: 4.0000 mg | Freq: Four times a day (QID) | INTRAMUSCULAR | Status: DC | PRN

## 2020-07-01 NOTE — ED Provider Notes (Signed)
[facility] Emergency Department Provider Note   ____________________________________________   I have reviewed the triage vital signs and the nursing notes.   HISTORY  Chief Complaint Bradycardia   History limited by: Not Limited   HPI Nancy Blackburn is a 74 y.o. female who presents to the emergency department today from primary care doctor.  Patient states that for the past month she has noticed around lunchtime sensations of feeling like she might pass out.  She states in the past week she has in fact passed out.  Sounds like there has been 2 episodes of true syncope.  The patient went to primary care doctor's office today for these issues.  While there they noticed patient to be quite bradycardic as well as hypotensive.  EKG was performed which appeared to show a junctional bradycardia. She was sent to the ER for further work up and evaluation.  Past Medical History:  Diagnosis Date  . Chronic sinus infection   . Ejection fraction    a. EF 55-65%, echo, mild LVH, June, 2013, small pericardial effusion  . Fracture, sacrum/coccyx ([place])   . Hyperlipidemia    a. takes Simvastatin  . Hypertension    a. takes Metoprolol, Lisinopril, and amlodipine.  . Low back pain   . Mild dementia ([place])   . Nasal polyps   . Normal nuclear stress test    a. 07/2005 Nl nuc stress test.  . Osteopenia   . Osteoporosis   . Palpitations    a. June 2013:  Event recorder normal sinus rhythm with rare PVCs - ? symptomatic PVC's.  . Parkinson disease ([place])   . Pericardial effusion    a. Small, echo, June, 2013  . PONV (postoperative nausea and vomiting)     Patient Active Problem List   Diagnosis Date Noted  . Family history of colon cancer in mother 02/22/2018  . Cervical spondylosis 05/04/2017  . Cervical radiculopathy 05/04/2017  . Family history of Cushing disease 01/12/2017  . B12 deficiency 12/18/2016  . Mild cognitive impairment with memory loss 12/18/2016   . Parkinsonian features 12/18/2016  . Vitamin D deficiency 12/18/2016  . Chalazion, bilateral 06/08/2016  . Absolute anemia 07/30/2015  . Edema extremities 07/30/2015  . Benign essential tremor 07/30/2015  . Anxiety, generalized 07/30/2015  . Gastro-esophageal reflux disease without esophagitis 07/30/2015  . HLD (hyperlipidemia) 07/30/2015  . Cannot sleep 07/30/2015  . Lumbar radiculopathy 07/30/2015  . Depression, major, recurrent, in partial remission ([place]) 07/30/2015  . Arthritis, degenerative 07/30/2015  . Allergic rhinitis 07/30/2015  . Gastroduodenal ulcer 07/30/2015  . Calcium blood increased 07/30/2015  . Pre-syncope 05/17/2015  . Dyspnea 03/31/2015  . Essential hypertension 03/31/2015  . Bleeding stomach ulcer 10/14/2013  . Anemia, iron deficiency 10/14/2013  . Weakness generalized 10/14/2013  . Ejection fraction   . Pericardial effusion   . Palpitations   . Low back pain   . Normal nuclear stress test     Past Surgical History:  Procedure Laterality Date  . BACK SURGERY  08/2010  . BLADDER SUSPENSION  2003  . bone spur  2009   removed from right collar bone area and partial collar bone removed  . THYROIDECTOMY, PARTIAL    . TONSILLECTOMY     as child  . TUBAL LIGATION     65yrs ago  . VAGINAL HYSTERECTOMY     43yrs ago d/t cervical cancer    Prior to Admission medications   Medication Sig Start Date End Date Taking? Authorizing  Provider  albuterol (PROVENTIL HFA;VENTOLIN HFA) 108 (90 Base) MCG/ACT inhaler Inhale 1-2 puffs into the lungs every 6 (six) hours as needed for wheezing or shortness of breath. Patient not taking: Reported on 07/01/2020 08/28/18   Jerrol Banana., MD  alendronate (FOSAMAX) 70 MG tablet TAKE 1 TABLET BY MOUTH EVERY 7 DAYS. TAKE WITH A FULL GLASS OF WATER ON AN EMPTY STOMACH Patient not taking: Reported on 07/01/2020 02/08/19   Jerrol Banana., MD  ARIPiprazole (ABILIFY) 5 MG tablet Take 5 mg by mouth daily. Patient not  taking: Reported on 07/01/2020    [provider]  busPIRone (BUSPAR) 10 MG tablet Take 10 mg by mouth 2 (two) times daily.  05/09/17   [provider]  Calcium Carbonate-Vitamin D (CALCIUM 600+D) 600-200 MG-UNIT TABS Take 1 tablet by mouth daily. Patient not taking: Reported on 07/01/2020    [provider]  citalopram (CELEXA) 40 MG tablet 40 mg daily.  Patient not taking: Reported on 07/01/2020 08/06/15   [provider]  colestipol (COLESTID) 1 g tablet Take 1 g by mouth 2 (two) times daily. Up to 3 times a day 08/12/19   [provider]  donepezil (ARICEPT) 5 MG tablet Take 5 mg by mouth at bedtime.    [provider]  fluticasone (FLONASE) 50 MCG/ACT nasal spray Place 2 sprays into both nostrils daily. Patient not taking: Reported on 07/01/2020 03/12/19   Jerrol Banana., MD  Fluticasone-Salmeterol (ADVAIR DISKUS) 250-50 MCG/DOSE AEPB Inhale 1 puff into the lungs 2 (two) times daily. 07/17/18 12/17/19  Wilhelmina Mcardle, MD  nitrofurantoin, macrocrystal-monohydrate, (MACROBID) 100 MG capsule  11/22/19   [provider]  omeprazole (PRILOSEC) 20 MG capsule Take 20 mg by mouth daily.  Patient not taking: Reported on 07/01/2020 03/11/19   [provider]  primidone (MYSOLINE) 50 MG tablet Take 50 mg by mouth at bedtime.    [provider]  propranolol (INDERAL) 20 MG tablet Take 1 tablet (20 mg total) by mouth 2 (two) times daily. 06/05/19   Jerrol Banana., MD  rosuvastatin (CRESTOR) 10 MG tablet Take 1 tablet (10 mg total) by mouth daily. 07/18/19   Jerrol Banana., MD  traZODone (DESYREL) 100 MG tablet Take 200 mg by mouth at bedtime.  10/18/17   [provider]  vitamin B-12 (CYANOCOBALAMIN) 1000 MCG tablet Take 1,000 mcg by mouth daily.    [provider]    Allergies Anti-inflammatory enzyme [nutritional supplements], Cymbalta [duloxetine hcl], Clarithromycin, Nsaids, Ranitidine, and  Venlafaxine  Family History  Problem Relation Age of Onset  . Colon cancer Mother   . Heart murmur Mother   . Heart disease Mother   . Angina Mother   . Heart disease Father   . COPD Father   . Atrial fibrillation Brother   . Cushing syndrome Daughter   . Cancer Daughter        thyroid  . Breast cancer Cousin   . Hypotension Neg Hx   . Malignant hyperthermia Neg Hx   . Pseudochol deficiency Neg Hx     Social History Social History   Tobacco Use  . Smoking status: Never Smoker  . Smokeless tobacco: Never Used  Vaping Use  . Vaping Use: Never used  Substance Use Topics  . Alcohol use: Not Currently    Alcohol/week: 0.0 standard drinks    Comment: drinks 3-4 times a year  . Drug use: No    Review of  Systems Constitutional: No fever/chills Eyes: No visual changes. ENT: No sore throat. Cardiovascular: Denies chest pain. Respiratory: Denies shortness of breath. Gastrointestinal: No abdominal pain.  No nausea, no vomiting.  No diarrhea.   Genitourinary: Negative for dysuria. Musculoskeletal: Negative for back pain. Skin: Negative for rash. Neurological: Positive for syncopal and near syncopal episodes.  ____________________________________________   PHYSICAL EXAM:  VITAL SIGNS: ED Triage Vitals [07/01/20 1552]  Enc Vitals Group     BP (!) 161/125     Pulse Rate (!) 55     Resp 20     Temp 98.8 F (37.1 C)     Temp Source Oral     SpO2 100 %     Weight 174 lb (78.9 kg)     Height 5\' 7"  (1.702 m)     Head Circumference      Peak Flow      Pain Score 0   Constitutional: Alert and oriented.  Eyes: Conjunctivae are normal.  ENT      Head: Normocephalic and atraumatic.      Nose: No congestion/rhinnorhea.      Mouth/Throat: Mucous membranes are moist.      Neck: No stridor. Hematological/Lymphatic/Immunilogical: No cervical lymphadenopathy. Cardiovascular: Normal rate, regular rhythm.  No murmurs, rubs, or gallops.  Respiratory: Normal respiratory effort  without tachypnea nor retractions. Breath sounds are clear and equal bilaterally. No wheezes/rales/rhonchi. Gastrointestinal: Soft and non tender. No rebound. No guarding.  Genitourinary: Deferred Musculoskeletal: Normal range of motion in all extremities. No lower extremity edema. Neurologic:  Normal speech and language. No gross focal neurologic deficits are appreciated.  Skin:  Skin is warm, dry and intact. No rash noted. Psychiatric: Mood and affect are normal. Speech and behavior are normal. Patient exhibits appropriate insight and judgment.  ____________________________________________    LABS (pertinent positives/negatives)  Trop hs 4 CBC wbc 9.0, hgb 14.5, plt 265 BMP wnl except cr 1.40  ____________________________________________   EKG  I, Nance Pear, attending physician, personally viewed and interpreted this EKG  EKG Time: 1549 Rate: 55 Rhythm: sinus bradycardia Axis: left axis deviation Intervals: qtc 436 QRS: narrow, q waves v1, v2 ST changes: no st elevation Impression: abnormal ekg  ____________________________________________    RADIOLOGY CXR No acute abnormality  ____________________________________________   PROCEDURES  Procedures  ____________________________________________   INITIAL IMPRESSION / ASSESSMENT AND PLAN / ED COURSE  Pertinent labs & imaging results that were available during my care of the patient were reviewed by me and considered in my medical decision making (see chart for details).   Patient presented from primary care doctor's office because of concerns for bradycardia low blood pressure in the setting of syncopal and near syncopal episodes.  In review of the EKG obtained at primary care doctor's office I do have concern that it was possibly junctional bradycardia.  Here in the emergency department patient is in sinus bradycardia.  Blood pressure is much better than reported from primary care doctor's office.  Will plan  on admission for further monitoring work up and management.  Discussed plan with patient.  ____________________________________________   FINAL CLINICAL IMPRESSION(S) / ED DIAGNOSES  Final diagnoses:  Syncope, unspecified syncope type  Junctional bradycardia     Note: This dictation was prepared with Dragon dictation. Any transcriptional errors that result from this process are unintentional     Nance Pear, MD 07/01/20 [phone]

## 2020-07-01 NOTE — ED Triage Notes (Addendum)
Pt sent to  The ER for eval of low heart rate by dr Rosanna Randy.  Pt has had syncopal episodes this week and low blood pressure.  Pt alert in triage.  No chest pain or sob.  No n/v/d.  Hx parkinson's

## 2020-07-02 ENCOUNTER — Encounter: Payer: Self-pay | Admitting: Family Medicine

## 2020-07-02 DIAGNOSIS — R55 Syncope and collapse: Secondary | ICD-10-CM

## 2020-07-02 DIAGNOSIS — E785 Hyperlipidemia, unspecified: Secondary | ICD-10-CM

## 2020-07-02 DIAGNOSIS — F329 Major depressive disorder, single episode, unspecified: Secondary | ICD-10-CM | POA: Diagnosis not present

## 2020-07-02 DIAGNOSIS — F419 Anxiety disorder, unspecified: Secondary | ICD-10-CM

## 2020-07-02 DIAGNOSIS — R251 Tremor, unspecified: Secondary | ICD-10-CM

## 2020-07-02 DIAGNOSIS — R42 Dizziness and giddiness: Secondary | ICD-10-CM | POA: Diagnosis not present

## 2020-07-02 DIAGNOSIS — R001 Bradycardia, unspecified: Secondary | ICD-10-CM | POA: Diagnosis not present

## 2020-07-02 LAB — BASIC METABOLIC PANEL
Anion gap: 8 (ref 5–15)
BUN: 17 mg/dL (ref 8–23)
CO2: 27 mmol/L (ref 22–32)
Calcium: 9 mg/dL (ref 8.9–10.3)
Chloride: 105 mmol/L (ref 98–111)
Creatinine, Ser: 1.39 mg/dL — ABNORMAL HIGH (ref 0.44–1.00)
GFR calc Af Amer: 43 mL/min — ABNORMAL LOW (ref >60)
GFR calc non Af Amer: 37 mL/min — ABNORMAL LOW (ref >60)
Glucose, Bld: 100 mg/dL — ABNORMAL HIGH (ref 70–99)
Potassium: 3.9 mmol/L (ref 3.5–5.1)
Sodium: 140 mmol/L (ref 135–145)

## 2020-07-02 LAB — CBC
HCT: 38.8 % (ref 36.0–46.0)
Hemoglobin: 13 g/dL (ref 12.0–15.0)
MCH: 29.6 pg (ref 26.0–34.0)
MCHC: 33.5 g/dL (ref 30.0–36.0)
MCV: 88.4 fL (ref 80.0–100.0)
Platelets: 216 10*3/uL (ref 150–400)
RBC: 4.39 MIL/uL (ref 3.87–5.11)
RDW: 11.9 % (ref 11.5–15.5)
WBC: 9.2 10*3/uL (ref 4.0–10.5)
nRBC: 0 % (ref 0.0–0.2)

## 2020-07-02 LAB — SARS CORONAVIRUS 2 BY RT PCR (HOSPITAL ORDER, PERFORMED IN ~~LOC~~ HOSPITAL LAB): SARS Coronavirus 2: NEGATIVE

## 2020-07-02 LAB — GLUCOSE, CAPILLARY: Glucose-Capillary: 97 mg/dL (ref 70–99)

## 2020-07-02 LAB — TROPONIN I (HIGH SENSITIVITY)
Troponin I (High Sensitivity): 6 ng/L (ref <18)
Troponin I (High Sensitivity): 6 ng/L (ref <18)

## 2020-07-02 MED ORDER — LOPERAMIDE HCL 2 MG PO CAPS
2.0000 mg | ORAL_CAPSULE | Freq: Once | ORAL | Status: AC
  Administered 2020-07-02: 2 mg via ORAL
  Filled 2020-07-02: qty 1

## 2020-07-02 NOTE — ED Notes (Signed)
Patient is resting comfortably. 

## 2020-07-02 NOTE — ED Notes (Signed)
Pt up to bathroom with co diarrhea, [place] NP made aware.

## 2020-07-02 NOTE — ED Notes (Signed)
Gave report to Control and instrumentation engineer for [employer]. Pt transported via bed with no concerns or distress

## 2020-07-02 NOTE — Consult Note (Signed)
CARDIOLOGY CONSULT NOTE               Patient ID: Nancy Blackburn MRN: [id number] DOB/AGE: 14-Sep-1946 74 y.o.  Admit date: 07/01/2020 Referring Physician Dr. Eugenie Norrie  Primary Physician Dr. Rosanna Randy  Primary Cardiologist n/a  Reason for Consultation Presyncope   HPI: Nancy Blackburn is a 74 year old female with a past medical history significant for Parkinson's disease, hyperlipidemia, and hypertension who presented to the ED on 07/01/20 for a near syncopal episode.  She reports recurrent syncopal/near syncopal episodes, typically occurring mid-afternoon while seated.  Yesterday, she presented to her PCPs office for further evaluation and was noted to be hypotensive and bradycardic, so further evaluation in the ED was recommended.  Workup in the ED included creatinine of 1.4 with a GFR of 37, high sensitivity troponin negative x 3, ECG revealing sinus bradycardia at a rate of 55bpm, and chest xray negative for acute cardiopulmonary abnormalities.    She is currently asymptomatic and denies dizziness, lightheadedness, chest pain, palpitations, shortness of breath, lower extremity swelling, orthopnea, or PND.  Aricept and propranolol are currently being held, and HR is in the 70s and she's normotensive.   Review of systems complete and found to be negative unless listed above     Past Medical History:  Diagnosis Date  . Chronic sinus infection   . Ejection fraction    a. EF 55-65%, echo, mild LVH, June, 2013, small pericardial effusion  . Fracture, sacrum/coccyx ([place])   . Hyperlipidemia    a. takes Simvastatin  . Hypertension    a. takes Metoprolol, Lisinopril, and amlodipine.  . Low back pain   . Mild dementia ([place])   . Nasal polyps   . Normal nuclear stress test    a. 07/2005 Nl nuc stress test.  . Osteopenia   . Osteoporosis   . Palpitations    a. June 2013:  Event recorder normal sinus rhythm with rare PVCs - ? symptomatic PVC's.  . Parkinson disease ([place])   .  Pericardial effusion    a. Small, echo, June, 2013  . PONV (postoperative nausea and vomiting)     Past Surgical History:  Procedure Laterality Date  . BACK SURGERY  08/2010  . BLADDER SUSPENSION  2003  . bone spur  2009   removed from right collar bone area and partial collar bone removed  . THYROIDECTOMY, PARTIAL    . TONSILLECTOMY     as child  . TUBAL LIGATION     6yrs ago  . VAGINAL HYSTERECTOMY     4yrs ago d/t cervical cancer    (Not in a hospital admission)  Social History   Socioeconomic History  . Marital status: Divorced    Spouse name: Not on file  . Number of children: 2  . Years of education: Not on file  . Highest education level: 12th grade  Occupational History  . Occupation: retired  Tobacco Use  . Smoking status: Never Smoker  . Smokeless tobacco: Never Used  Vaping Use  . Vaping Use: Never used  Substance and Sexual Activity  . Alcohol use: Not Currently    Alcohol/week: 0.0 standard drinks    Comment: drinks 3-4 times a year  . Drug use: No  . Sexual activity: Never  Other Topics Concern  . Not on file  Social History Narrative  . Not on file   Social Determinants of Health   Financial Resource Strain: Low Risk   . Difficulty of Paying Living  Expenses: Not hard at all  Food Insecurity: No Food Insecurity  . Worried About Charity fundraiser in the Last Year: Never true  . Ran Out of Food in the Last Year: Never true  Transportation Needs: No Transportation Needs  . Lack of Transportation (Medical): No  . Lack of Transportation (Non-Medical): No  Physical Activity: Insufficiently Active  . Days of Exercise per Week: 2 days  . Minutes of Exercise per Session: 60 min  Stress: No Stress Concern Present  . Feeling of Stress : Not at all  Social Connections: Moderately Integrated  . Frequency of Communication with Friends and Family: More than three times a week  . Frequency of Social Gatherings with Friends and Family: More than three  times a week  . Attends Religious Services: More than 4 times per year  . Active Member of Clubs or Organizations: Yes  . Attends Archivist Meetings: More than 4 times per year  . Marital Status: Divorced  Human resources officer Violence: Not At Risk  . Fear of Current or Ex-Partner: No  . Emotionally Abused: No  . Physically Abused: No  . Sexually Abused: No    Family History  Problem Relation Age of Onset  . Colon cancer Mother   . Heart murmur Mother   . Heart disease Mother   . Angina Mother   . Heart disease Father   . COPD Father   . Atrial fibrillation Brother   . Cushing syndrome Daughter   . Cancer Daughter        thyroid  . Breast cancer Cousin   . Hypotension Neg Hx   . Malignant hyperthermia Neg Hx   . Pseudochol deficiency Neg Hx       Review of systems complete and found to be negative unless listed above      PHYSICAL EXAM  General: Well developed, well nourished, in no acute distress HEENT:  Normocephalic and atramatic Neck:  No JVD.  Lungs: Clear bilaterally to auscultation and percussion. Heart: HRRR . Normal S1 and S2 without gallops or murmurs.  Abdomen: Bowel sounds are positive, abdomen soft and non-tender  Msk:  Back normal. Normal strength and tone for age. Extremities: No clubbing, cyanosis or edema.   Neuro: Alert and oriented X 3. Psych:  Good affect, responds appropriately  Labs:   Lab Results  Component Value Date   WBC 9.2 07/02/2020   HGB 13.0 07/02/2020   HCT 38.8 07/02/2020   MCV 88.4 07/02/2020   PLT 216 07/02/2020    Recent Labs  Lab 07/02/20 0434  NA 140  K 3.9  CL 105  CO2 27  BUN 17  CREATININE 1.39*  CALCIUM 9.0  GLUCOSE 100*   Lab Results  Component Value Date   CKTOTAL 35 04/22/2016   CKMB 1.1 10/05/2013   TROPONINI 0.03 10/05/2013    Lab Results  Component Value Date   CHOL 200 (H) 04/12/2019   CHOL 178 02/21/2018   CHOL 191 01/09/2017   Lab Results  Component Value Date   HDL 59  04/12/2019   HDL 58 02/21/2018   HDL 52 01/09/2017   Lab Results  Component Value Date   LDLCALC 110 (H) 04/12/2019   LDLCALC 86 02/21/2018   LDLCALC 111 (H) 01/09/2017   Lab Results  Component Value Date   TRIG 154 (H) 04/12/2019   TRIG 168 (H) 02/21/2018   TRIG 141 01/09/2017   Lab Results  Component Value Date   CHOLHDL 3.4  04/12/2019   CHOLHDL 3.7 01/09/2017   No results found for: LDLDIRECT    Radiology: DG Chest 2 View  Result Date: 07/01/2020 CLINICAL DATA:  Bradycardia, syncopal episodes, low blood pressure; past history of hypertension, dementia, Parkinson's EXAM: CHEST - 2 VIEW COMPARISON:  05/26/2016 FINDINGS: Slight rotation of the shoulders to the RIGHT. Normal heart size, mediastinal contours, and pulmonary vascularity. Atherosclerotic calcification aorta. Eventration anterior RIGHT diaphragm unchanged. Scarring at RIGHT middle lobe. No pulmonary infiltrate, pleural effusion or pneumothorax. Diffuse osseous demineralization. IMPRESSION: RIGHT middle lobe scarring. No acute abnormalities. Aortic Atherosclerosis (ICD10-I70.0). Electronically Signed   By: Lavonia Dana M.D.   On: 07/01/2020 16:45    EKG: Sinus bradycardia at a ventricular rate of 55bpm with no obvious acute ischemia   ASSESSMENT AND PLAN:   1.  Syncope   -Likely in the setting of bradycardia and hypotension; would agree to continue to hold propranolol and Aricept   -Ruled out for ACS as troponin x 3 were negative   -Echocardiogram pending; pending hospital course, may consider Holter monitoring in an outpatient setting    2.  Benign essential tremors with Parkinsonism   -Would continue to hold Aricept and propranolol   3.  Hyperlipidemia   -Continue rosuvastatin, colestipol   The history, physical exam findings, and plan of care were all discussed with Dr. Bartholome Bill, and all decision making was made in collaboration.   Signed: Avie Arenas PA-C 07/02/2020, 8:09 AM

## 2020-07-02 NOTE — H&P (Addendum)
[facility] [place] NAME: Nancy Blackburn    MR#:  [id number]  DATE OF BIRTH:  1946/08/09  DATE OF ADMISSION:  07/01/2020  PRIMARY CARE PHYSICIAN: Jerrol Banana., MD   REQUESTING/REFERRING PHYSICIAN: Nance Pear, MD  CHIEF COMPLAINT:   Chief Complaint  Patient presents with  . Bradycardia    HISTORY OF PRESENT ILLNESS:  Nancy Blackburn  is a 74 y.o. female with a known history of hypertension, dyslipidemia and Parkinson's disease, who presented to the emergency room with acute onset of presyncope while sitting today.  She been having recurrent presyncope and 2 episodes of syncope over the last month.  She was seen today at her primary care physician's office and was noted to have junctional bradycardia with a rate that was down to 38 and systolic blood pressure of 91/59 and was reportedly down to the 80s.  She denies any headache or dizziness or blurred vision.  No paresthesias or focal muscle weakness.  No chest pain or dyspnea or palpitations.  No nausea or vomiting or abdominal pain.  No bleeding diathesis.  No dysuria, oliguria or hematuria or flank pain.  Upon presentation to the emergency room, blood pressure was 161/125 and later 141/86 with a pulse of 55, respiratory rate 20 and pulse ox 91% on room air and temperature 98.8.  Labs revealed creatinine 1.4 up from 1.12 on 02/28/2019 and high-sensitivity troponin I of 4 and later 6 CBC was unremarkable.  Chest x-ray showed right middle lobe scarring with no acute abnormalities and aortic atherosclerosis. EKG showed sinus bradycardia with rate of 55 with left axis deviation, LVH and Q waves anteroseptally.  She will be admitted to an observation progressive unit bed for further evaluation and management. PAST MEDICAL HISTORY:   Past Medical History:  Diagnosis Date  . Chronic sinus infection   . Ejection fraction    a. EF 55-65%, echo, mild LVH, June, 2013, small pericardial  effusion  . Fracture, sacrum/coccyx ([place])   . Hyperlipidemia    a. takes Simvastatin  . Hypertension    a. takes Metoprolol, Lisinopril, and amlodipine.  . Low back pain   . Mild dementia ([place])   . Nasal polyps   . Normal nuclear stress test    a. 07/2005 Nl nuc stress test.  . Osteopenia   . Osteoporosis   . Palpitations    a. June 2013:  Event recorder normal sinus rhythm with rare PVCs - ? symptomatic PVC's.  . Parkinson disease ([place])   . Pericardial effusion    a. Small, echo, June, 2013  . PONV (postoperative nausea and vomiting)     PAST SURGICAL HISTORY:   Past Surgical History:  Procedure Laterality Date  . BACK SURGERY  08/2010  . BLADDER SUSPENSION  2003  . bone spur  2009   removed from right collar bone area and partial collar bone removed  . THYROIDECTOMY, PARTIAL    . TONSILLECTOMY     as child  . TUBAL LIGATION     44yrs ago  . VAGINAL HYSTERECTOMY     31yrs ago d/t cervical cancer    SOCIAL HISTORY:   Social History   Tobacco Use  . Smoking status: Never Smoker  . Smokeless tobacco: Never Used  Substance Use Topics  . Alcohol use: Not Currently    Alcohol/week: 0.0 standard drinks    Comment: drinks 3-4 times a year    FAMILY HISTORY:   Family History  Problem Relation Age of Onset  . Colon cancer Mother   . Heart murmur Mother   . Heart disease Mother   . Angina Mother   . Heart disease Father   . COPD Father   . Atrial fibrillation Brother   . Cushing syndrome Daughter   . Cancer Daughter        thyroid  . Breast cancer Cousin   . Hypotension Neg Hx   . Malignant hyperthermia Neg Hx   . Pseudochol deficiency Neg Hx     DRUG ALLERGIES:   Allergies  Allergen Reactions  . Anti-Inflammatory Enzyme [Nutritional Supplements] Other (See Comments)    REACTION: Due to bleeding ulcer  . Cymbalta [Duloxetine Hcl] Other (See Comments)    REACTION: Urinary retention  . Clarithromycin Other (See Comments)    Upsets stomach  . Nsaids       GI BLEED  . Ranitidine     severe diarrhea  . Venlafaxine     severe depression    REVIEW OF SYSTEMS:   ROS As per history of present illness. All pertinent systems were reviewed above. Constitutional, HEENT, cardiovascular, respiratory, GI, GU, musculoskeletal, neuro, psychiatric, endocrine, integumentary and hematologic systems were reviewed and are otherwise negative/unremarkable except for positive findings mentioned above in the HPI.   MEDICATIONS AT HOME:   Prior to Admission medications   Medication Sig Start Date End Date Taking? Authorizing Provider  albuterol (PROVENTIL HFA;VENTOLIN HFA) 108 (90 Base) MCG/ACT inhaler Inhale 1-2 puffs into the lungs every 6 (six) hours as needed for wheezing or shortness of breath. Patient not taking: Reported on 07/01/2020 08/28/18   Jerrol Banana., MD  alendronate (FOSAMAX) 70 MG tablet TAKE 1 TABLET BY MOUTH EVERY 7 DAYS. TAKE WITH A FULL GLASS OF WATER ON AN EMPTY STOMACH Patient not taking: Reported on 07/01/2020 02/08/19   Jerrol Banana., MD  ARIPiprazole (ABILIFY) 5 MG tablet Take 5 mg by mouth daily. Patient not taking: Reported on 07/01/2020    [provider]  busPIRone (BUSPAR) 10 MG tablet Take 10 mg by mouth 2 (two) times daily.  05/09/17   [provider]  Calcium Carbonate-Vitamin D (CALCIUM 600+D) 600-200 MG-UNIT TABS Take 1 tablet by mouth daily. Patient not taking: Reported on 07/01/2020    [provider]  citalopram (CELEXA) 40 MG tablet 40 mg daily.  Patient not taking: Reported on 07/01/2020 08/06/15   [provider]  colestipol (COLESTID) 1 g tablet Take 1 g by mouth 2 (two) times daily. Up to 3 times a day 08/12/19   [provider]  donepezil (ARICEPT) 5 MG tablet Take 5 mg by mouth at bedtime.    [provider]  fluticasone (FLONASE) 50 MCG/ACT nasal spray Place 2 sprays into both nostrils daily. Patient not taking: Reported on 07/01/2020 03/12/19    Jerrol Banana., MD  Fluticasone-Salmeterol (ADVAIR DISKUS) 250-50 MCG/DOSE AEPB Inhale 1 puff into the lungs 2 (two) times daily. 07/17/18 12/17/19  Wilhelmina Mcardle, MD  nitrofurantoin, macrocrystal-monohydrate, (MACROBID) 100 MG capsule  11/22/19   [provider]  omeprazole (PRILOSEC) 20 MG capsule Take 20 mg by mouth daily.  Patient not taking: Reported on 07/01/2020 03/11/19   [provider]  primidone (MYSOLINE) 50 MG tablet Take 50 mg by mouth at bedtime.    [provider]  propranolol (INDERAL) 20 MG tablet Take 1 tablet (20 mg total) by mouth 2 (two) times daily. 06/05/19   Eulas Post  Brooke Bonito., MD  rosuvastatin (CRESTOR) 10 MG tablet Take 1 tablet (10 mg total) by mouth daily. 07/18/19   Jerrol Banana., MD  traZODone (DESYREL) 100 MG tablet Take 200 mg by mouth at bedtime.  10/18/17   [provider]  vitamin B-12 (CYANOCOBALAMIN) 1000 MCG tablet Take 1,000 mcg by mouth daily.    [provider]      VITAL SIGNS:  Blood pressure (!) 141/86, pulse 63, temperature 98.8 F (37.1 C), temperature source Oral, resp. rate 14, height 5\' 7"  (1.702 m), weight 78.9 kg, SpO2 97 %.  PHYSICAL EXAMINATION:  Physical Exam  GENERAL:  74 y.o.-year-old Caucasian female patient lying in the bed with no acute distress.  EYES: Pupils equal, round, reactive to light and accommodation. No scleral icterus. Extraocular muscles intact.  HEENT: Head atraumatic, normocephalic. Oropharynx and nasopharynx clear.  NECK:  Supple, no jugular venous distention. No thyroid enlargement, no tenderness.  LUNGS: Normal breath sounds bilaterally, no wheezing, rales,rhonchi or crepitation. No use of accessory muscles of respiration.  CARDIOVASCULAR: Regular rate and rhythm, S1, S2 normal. No murmurs, rubs, or gallops.  ABDOMEN: Soft, nondistended, nontender. Bowel sounds present. No organomegaly or mass.  EXTREMITIES: No pedal edema, cyanosis, or clubbing.    NEUROLOGIC: Cranial nerves II through XII are intact. Muscle strength 5/5 in all extremities. Sensation intact. Gait not checked.  She has resting tremors PSYCHIATRIC: The patient is alert and oriented x 3.  Normal affect and good eye contact. SKIN: No obvious rash, lesion, or ulcer.   LABORATORY PANEL:   CBC Recent Labs  Lab 07/01/20 1555  WBC 9.0  HGB 14.5  HCT 43.1  PLT 265   ------------------------------------------------------------------------------------------------------------------  Chemistries  Recent Labs  Lab 07/01/20 1555  NA 135  K 4.4  CL 98  CO2 24  GLUCOSE 90  BUN 16  CREATININE 1.40*  CALCIUM 9.1   ------------------------------------------------------------------------------------------------------------------  Cardiac Enzymes No results for input(s): TROPONINI in the last 168 hours. ------------------------------------------------------------------------------------------------------------------  RADIOLOGY:  DG Chest 2 View  Result Date: 07/01/2020 CLINICAL DATA:  Bradycardia, syncopal episodes, low blood pressure; past history of hypertension, dementia, Parkinson's EXAM: CHEST - 2 VIEW COMPARISON:  05/26/2016 FINDINGS: Slight rotation of the shoulders to the RIGHT. Normal heart size, mediastinal contours, and pulmonary vascularity. Atherosclerotic calcification aorta. Eventration anterior RIGHT diaphragm unchanged. Scarring at RIGHT middle lobe. No pulmonary infiltrate, pleural effusion or pneumothorax. Diffuse osseous demineralization. IMPRESSION: RIGHT middle lobe scarring. No acute abnormalities. Aortic Atherosclerosis (ICD10-I70.0). Electronically Signed   By: Lavonia Dana M.D.   On: 07/01/2020 16:45      IMPRESSION AND PLAN:  1.  Recurrent presyncope and syncope. -The patient will be admitted to an observation progressive unit bed. -This could be secondary to symptomatic bradycardia with documented junctional bradycardia. -Aricept and  propranolol will be held off. -2D echo and a cardiology consultation will be obtained. -I notified Dr. Nehemiah Massed about the patient. -We will also obtain bilateral carotid Doppler. -We will check her orthostatics every 12 hours. -She will be monitored for any other arrhythmias. -Other differential diagnoses will include neurally mediated syncope and less likely hypoglycemia and orthostatic hypotension as she was sitting.  2.  Benign essential tremors and parkinsonism with mild dementia. -Continue primidone and Sinemet.  Aricept will be held off given current bradycardia.  3.  Anxiety and depression. -We will continue BuSpar and Celexa as well as Abilify.  4.  Dyslipidemia.  We will continue colestipol and statin therapy.  5.  Vitamin B12 deficiency. -  Vitamin B12 will be continued.  6.  DVT prophylaxis. -Subcutaneous Lovenox   All the records are reviewed and case discussed with ED provider. The plan of care was discussed in details with the patient (and family). I answered all questions. The patient agreed to proceed with the above mentioned plan. Further management will depend upon hospital course.   CODE STATUS: Full code  Status is: Observation  The patient remains OBS appropriate and will d/c before 2 midnights.  Dispo: The patient is from: Home              Anticipated d/c is to: Home              Anticipated d/c date is: 2 days              Patient currently is not medically stable to d/c.    TOTAL TIME TAKING CARE OF THIS PATIENT: 55 minutes.    Christel Mormon M.D on 07/02/2020 at 12:31 AM  [facility]   From 7 PM-7 AM, contact night-coverage [email or web address]  CC: Primary care physician; Jerrol Banana., MD   Note: This dictation was prepared with Dragon dictation along with smaller phrase technology. Any transcriptional typo errors that result from this process are unintentional.

## 2020-07-02 NOTE — ED Notes (Signed)
Pt readjusted in bed. Pt states her IV is hurting. Pt has been laying on the right side. Suggested maybe that affected it. No IV fluids infusing. No swelling, redness or bruising. Offered to d/c IV and replace. Pt declined. Pt now sitting up. Pt states IV feels better.

## 2020-07-02 NOTE — ED Notes (Signed)
Pt resting comfortably, no complaints at this time.

## 2020-07-02 NOTE — Progress Notes (Addendum)
She has no complaints.  She feels better.  Heart rate has mostly been above 60s sometimes into the 70s and 80s.  Propranolol and Aricept have been held because of bradycardia.  2D echo is pending.  Awaiting final recommendations from cardiologist.  PT evaluation.

## 2020-07-02 NOTE — ED Notes (Signed)
Pt now states she is taking aricept.

## 2020-07-02 NOTE — ED Notes (Signed)
Report received from [facility]. Patient care assumed. Patient/RN introduction complete. Will continue to monitor. Pt switched over to hospital bed and placed in gown for comfort. Denies any pain at this time. Awaiting hospital bed availability.

## 2020-07-02 NOTE — ED Notes (Signed)
Pt resting quietly, no co pain or discomfort.

## 2020-07-03 ENCOUNTER — Observation Stay
Admit: 2020-07-03 | Discharge: 2020-07-03 | Disposition: A | Discharge status: Home / Self Care | Payer: Medicare Other | Attending: Family Medicine | Admitting: Family Medicine

## 2020-07-03 DIAGNOSIS — R001 Bradycardia, unspecified: Secondary | ICD-10-CM | POA: Diagnosis present

## 2020-07-03 DIAGNOSIS — R42 Dizziness and giddiness: Secondary | ICD-10-CM | POA: Diagnosis not present

## 2020-07-03 DIAGNOSIS — R55 Syncope and collapse: Secondary | ICD-10-CM | POA: Diagnosis not present

## 2020-07-03 LAB — ECHOCARDIOGRAM COMPLETE
AR max vel: 3.87 cm2
AV Area VTI: 2.16 cm2
AV Area mean vel: 1.93 cm2
AV Mean grad: 6.5 mmHg
AV Peak grad: 10.6 mmHg
Ao pk vel: 1.63 m/s
Area-P 1/2: 2.33 cm2
Height: 67 in
S' Lateral: 2.43 cm
Weight: 2784 oz

## 2020-07-03 LAB — GLUCOSE, CAPILLARY: Glucose-Capillary: 112 mg/dL — ABNORMAL HIGH (ref 70–99)

## 2020-07-03 NOTE — Discharge Summary (Addendum)
Physician Discharge Summary  Nancy Blackburn LSL:[id number] DOB: Jul 26, 1946 DOA: 07/01/2020  PCP: Jerrol Banana., MD  Admit date: 07/01/2020 Discharge date: 07/03/2020  Discharge disposition: Home   Recommendations for Outpatient Follow-Up:   Outpatient follow-up with PCP in 1 week   Discharge Diagnosis:   Active Problems:   Postural dizziness with presyncope   Bradycardia    Discharge Condition: Stable.  Diet recommendation:  Diet Order            Diet - low sodium heart healthy           Diet Heart Room service appropriate? Yes; Fluid consistency: Thin  Diet effective now                   Code Status: Full Code     Hospital Course:   Nancy Blackburn is a 74 year old with history of hypertension, dyslipidemia, Parkinson's disease with tremors, pericardial effusion, osteoporosis, chronic back pain, cervical spondylosis, anxiety, and bleeding gastric ulcer, vitamin D deficiency, mild cognitive impairment, who presented to the hospital with acute onset of presyncope while she was sitting.  She reported having recurrent episodes of presyncope and 2 episodes of syncope over the last month.  She went to see her primary care physician on the day of admission where she was found to have bradycardia with a heart rate of 38 and hypotension with BP of 91/59.  She was subsequently referred to the emergency room for further management.  She was admitted to the hospital for observation.  She was previously on propranolol and Aricept which were discontinued.  Her heart rate persistently stayed about the 70s after discontinuation of these medications.  She was also seen in consultation by the cardiologist.  2D echo showed EF estimated at 60 to 65% and grade 1 diastolic dysfunction.  Her condition has improved.  She has been able to ambulate without any symptoms.  She feels better and she is deemed stable for discharge to home today.  She is okay for discharge  from cardiology standpoint.     Discharge Exam:   Vitals:   07/03/20 0822 07/03/20 1136  BP: (!) 130/64 (!) 151/72  Pulse: 68 91  Resp: 16 16  Temp: 97.7 F (36.5 C) (!) 97.5 F (36.4 C)  SpO2: 94% 94%   Vitals:   07/02/20 2352 07/03/20 0416 07/03/20 0822 07/03/20 1136  BP: (!) 155/77 (!) 136/68 (!) 130/64 (!) 151/72  Pulse: 80 81 68 91  Resp: 17 18 16 16   Temp: 98.3 F (36.8 C) 98 F (36.7 C) 97.7 F (36.5 C) (!) 97.5 F (36.4 C)  TempSrc:  Oral Oral Oral  SpO2: 94% 92% 94% 94%  Weight:      Height:         GEN: NAD SKIN: No rash EYES: EOMI ENT: MMM CV: RRR PULM: CTA B ABD: soft, ND, NT, +BS CNS: AAO x 3, non focal, tremors of left hand EXT: No edema or tenderness   The results of significant diagnostics from this hospitalization (including imaging, microbiology, ancillary and laboratory) are listed below for reference.     Procedures and Diagnostic Studies:   ECHOCARDIOGRAM COMPLETE  Result Date: 07/03/2020    ECHOCARDIOGRAM REPORT   Patient Name:   Nancy Blackburn Stare Date of Exam: 07/03/2020 Medical Rec #:  [id number]            Height:       67.0 in Accession #:    [id number]  Weight:       174.0 lb Date of Birth:  1946-05-18           BSA:          1.906 m Patient Age:    74 years             BP:           136/68 mmHg Patient Gender: F                    HR:           81 bpm. Exam Location:  [facility] Procedure: 2D Echo, Cardiac Doppler and Color Doppler Indications:     Syncope 780.2  History:         Patient has prior history of Echocardiogram examinations, most                  recent 04/15/2015. Risk Factors:Hypertension. Pericardial                  effusion.  Sonographer:     Sherrie Sport RDCS (AE) Referring Phys:  [id number] Arvella Merles MANSY Diagnosing Phys: Bartholome Bill MD IMPRESSIONS  1. Left ventricular ejection fraction, by estimation, is 60 to 65%. The left ventricle has normal function. The left ventricle has no regional wall motion abnormalities. There  is mild left ventricular hypertrophy. Left ventricular diastolic parameters are consistent with Grade I diastolic dysfunction (impaired relaxation).  2. Right ventricular systolic function is normal. The right ventricular size is normal. There is normal pulmonary artery systolic pressure.  3. The mitral valve was not well visualized. Trivial mitral valve regurgitation.  4. The aortic valve was not well visualized. Aortic valve regurgitation is not visualized. FINDINGS  Left Ventricle: Left ventricular ejection fraction, by estimation, is 60 to 65%. The left ventricle has normal function. The left ventricle has no regional wall motion abnormalities. The left ventricular internal cavity size was normal in size. There is  mild left ventricular hypertrophy. Left ventricular diastolic parameters are consistent with Grade I diastolic dysfunction (impaired relaxation). Right Ventricle: The right ventricular size is normal. No increase in right ventricular wall thickness. Right ventricular systolic function is normal. There is normal pulmonary artery systolic pressure. The tricuspid regurgitant velocity is 2.36 m/s, and  with an assumed right atrial pressure of 10 mmHg, the estimated right ventricular systolic pressure is 09.9 mmHg. Left Atrium: Left atrial size was normal in size. Right Atrium: Right atrial size was normal in size. Pericardium: There is no evidence of pericardial effusion. Mitral Valve: The mitral valve was not well visualized. Trivial mitral valve regurgitation. Tricuspid Valve: The tricuspid valve is not well visualized. Tricuspid valve regurgitation is trivial. Aortic Valve: The aortic valve was not well visualized. Aortic valve regurgitation is not visualized. Aortic valve mean gradient measures 6.5 mmHg. Aortic valve peak gradient measures 10.6 mmHg. Aortic valve area, by VTI measures 2.16 cm. Pulmonic Valve: The pulmonic valve was not well visualized. Pulmonic valve regurgitation is not visualized.  Aorta: The aortic root is normal in size and structure. IAS/Shunts: The interatrial septum was not assessed.  LEFT VENTRICLE PLAX 2D LVIDd:         3.70 cm  Diastology LVIDs:         2.43 cm  LV e' lateral:   7.62 cm/s LV PW:         1.56 cm  LV E/e' lateral: 9.1 LV IVS:        1.19  cm  LV e' medial:    6.20 cm/s LVOT diam:     2.20 cm  LV E/e' medial:  11.2 LV SV:         67 LV SV Index:   35 LVOT Area:     3.80 cm  RIGHT VENTRICLE RV Basal diam:  2.25 cm LEFT ATRIUM             Index       RIGHT ATRIUM           Index LA diam:        2.50 cm 1.31 cm/m  RA Area:     14.90 cm LA Vol (A2C):   88.5 ml 46.43 ml/m RA Volume:   34.50 ml  18.10 ml/m LA Vol (A4C):   40.6 ml 21.30 ml/m LA Biplane Vol: 63.6 ml 33.37 ml/m  AORTIC VALVE                    PULMONIC VALVE AV Area (Vmax):    3.87 cm     RVOT Peak grad: 2 mmHg AV Area (Vmean):   1.93 cm AV Area (VTI):     2.16 cm AV Vmax:           163.00 cm/s AV Vmean:          123.500 cm/s AV VTI:            0.312 m AV Peak Grad:      10.6 mmHg AV Mean Grad:      6.5 mmHg LVOT Vmax:         166.00 cm/s LVOT Vmean:        62.600 cm/s LVOT VTI:          0.177 m LVOT/AV VTI ratio: 0.57  AORTA Ao Root diam: 2.90 cm MITRAL VALVE                TRICUSPID VALVE MV Area (PHT): 2.33 cm     TR Peak grad:   22.3 mmHg MV Decel Time: 325 msec     TR Vmax:        236.00 cm/s MV E velocity: 69.40 cm/s MV A velocity: 107.00 cm/s  SHUNTS MV E/A ratio:  0.65         Systemic VTI:  0.18 m                             Systemic Diam: 2.20 cm Bartholome Bill MD Electronically signed by Bartholome Bill MD Signature Date/Time: 07/03/2020/8:49:49 AM    Final      Labs:   Basic Metabolic Panel: Recent Labs  Lab 07/01/20 1555 07/02/20 0434  NA 135 140  K 4.4 3.9  CL 98 105  CO2 24 27  GLUCOSE 90 100*  BUN 16 17  CREATININE 1.40* 1.39*  CALCIUM 9.1 9.0   GFR Estimated Creatinine Clearance: 38.4 mL/min (A) (by C-G formula based on SCr of 1.39 mg/dL (H)). Liver Function Tests: No  results for input(s): AST, ALT, ALKPHOS, BILITOT, PROT, ALBUMIN in the last 168 hours. No results for input(s): LIPASE, AMYLASE in the last 168 hours. No results for input(s): AMMONIA in the last 168 hours. Coagulation profile No results for input(s): INR, PROTIME in the last 168 hours.  CBC: Recent Labs  Lab 07/01/20 1555 07/02/20 0434  WBC 9.0 9.2  HGB 14.5 13.0  HCT 43.1 38.8  MCV 89.2 88.4  PLT 265  216   Cardiac Enzymes: No results for input(s): CKTOTAL, CKMB, CKMBINDEX, TROPONINI in the last 168 hours. BNP: Invalid input(s): POCBNP CBG: Recent Labs  Lab 07/02/20 0432 07/03/20 0625  GLUCAP 97 112*   D-Dimer No results for input(s): DDIMER in the last 72 hours. Hgb A1c No results for input(s): HGBA1C in the last 72 hours. Lipid Profile No results for input(s): CHOL, HDL, LDLCALC, TRIG, CHOLHDL, LDLDIRECT in the last 72 hours. Thyroid function studies No results for input(s): TSH, T4TOTAL, T3FREE, THYROIDAB in the last 72 hours.  Invalid input(s): FREET3 Anemia work up No results for input(s): VITAMINB12, FOLATE, FERRITIN, TIBC, IRON, RETICCTPCT in the last 72 hours. Microbiology Recent Results (from the past 240 hour(s))  SARS Coronavirus 2 by RT PCR (hospital order, performed in [facility] hospital lab) Nasopharyngeal Nasopharyngeal Swab     Status: None   Collection Time: 07/01/20 10:17 PM   Specimen: Nasopharyngeal Swab  Result Value Ref Range Status   SARS Coronavirus 2 NEGATIVE NEGATIVE Final    Comment: (NOTE) SARS-CoV-2 target nucleic acids are NOT DETECTED.  The SARS-CoV-2 RNA is generally detectable in upper and lower respiratory specimens during the acute phase of infection. The lowest concentration of SARS-CoV-2 viral copies this assay can detect is 250 copies / mL. A negative result does not preclude SARS-CoV-2 infection and should not be used as the sole basis for treatment or other patient management decisions.  A negative result may occur  with improper specimen collection / handling, submission of specimen other than nasopharyngeal swab, presence of viral mutation(s) within the areas targeted by this assay, and inadequate number of viral copies (<250 copies / mL). A negative result must be combined with clinical observations, patient history, and epidemiological information.  Fact Sheet for Patients:   StrictlyIdeas.no  Fact Sheet for Healthcare Providers: BankingDealers.co.za  This test is not yet approved or  cleared by the [place] FDA and has been authorized for detection and/or diagnosis of SARS-CoV-2 by FDA under an Emergency Use Authorization (EUA).  This EUA will remain in effect (meaning this test can be used) for the duration of the COVID-19 declaration under Section 564(b)(1) of the Act, 21 U.S.C. section 360bbb-3(b)(1), unless the authorization is terminated or revoked sooner.  Performed at [facility], [address] [place], [place] [address]      Discharge Instructions:   Discharge Instructions    Diet - low sodium heart healthy   Complete by: As directed    Increase activity slowly   Complete by: As directed      Allergies as of 07/03/2020      Reactions   Anti-inflammatory Enzyme [nutritional Supplements] Other (See Comments)   REACTION: Due to bleeding ulcer   Cymbalta [duloxetine Hcl] Other (See Comments)   REACTION: Urinary retention   Clarithromycin Other (See Comments)   Upsets stomach   Donepezil Hcl Other (See Comments)   Significant bradycardia causing syncope   Nsaids    GI BLEED   Ranitidine    severe diarrhea   Venlafaxine    severe depression      Medication List    STOP taking these medications   donepezil 5 MG tablet Commonly known as: ARICEPT   propranolol 20 MG tablet Commonly known as: INDERAL     TAKE these medications   busPIRone 10 MG tablet Commonly known as: BUSPAR Take 10 mg by mouth  2 (two) times daily.   citalopram 40 MG tablet Commonly known as: CELEXA 40 mg  daily.   colestipol 1 g tablet Commonly known as: COLESTID Take 1 g by mouth 2 (two) times daily. Up to 3 times a day   nitrofurantoin (macrocrystal-monohydrate) 100 MG capsule Commonly known as: MACROBID   primidone 50 MG tablet Commonly known as: MYSOLINE Take 50 mg by mouth at bedtime.   rosuvastatin 10 MG tablet Commonly known as: Crestor Take 1 tablet (10 mg total) by mouth daily.   traZODone 100 MG tablet Commonly known as: DESYREL 2 Tablet(s) By Mouth Every Night PRN   vitamin B-12 1000 MCG tablet Commonly known as: CYANOCOBALAMIN Take 1,000 mcg by mouth daily.         Time coordinating discharge: 27 minutes  Signed:  Jordain Radin  [facility] 07/03/2020, 4:43 PM

## 2020-07-03 NOTE — Progress Notes (Signed)
[facility]    SUBJECTIVE: Nancy Blackburn is a 74 year old female with a past medical history significant for Parkinson's disease, hyperlipidemia, and hypertension who presented to the ED on 07/01/20 for a near syncopal episode.  She reports recurrent syncopal/near syncopal episodes, typically occurring mid-afternoon while seated.  Yesterday, she presented to her PCPs office for further evaluation and was noted to be hypotensive and bradycardic, so further evaluation in the ED was recommended.  Workup in the ED included creatinine of 1.4 with a GFR of 37, high sensitivity troponin negative x 3, ECG revealing sinus bradycardia at a rate of 55bpm, and chest xray negative for acute cardiopulmonary abnormalities.    07/02/20: She is currently asymptomatic and denies dizziness, lightheadedness, chest pain, palpitations, shortness of breath, lower extremity swelling, orthopnea, or PND.  Aricept and propranolol are currently being held, and HR is in the 70s and she's normotensive.   07/03/20: She denies any recurrent syncopal or presyncopal episodes.  Heart rate remains stable, currently in the 80s, and she denies chest pain, palpitations, heart racing, shortness of breath, lower extremity swelling, orthopnea, or PND.    Vitals:   07/02/20 1952 07/02/20 2352 07/03/20 0416 07/03/20 0822  BP: (!) 136/65 (!) 155/77 (!) 136/68 (!) 130/64  Pulse: 75 80 81 68  Resp: 17 17 18 16   Temp: 98.7 F (37.1 C) 98.3 F (36.8 C) 98 F (36.7 C) 97.7 F (36.5 C)  TempSrc: Oral  Oral Oral  SpO2: 96% 94% 92% 94%  Weight:      Height:         Intake/Output Summary (Last 24 hours) at 07/03/2020 1011 Last data filed at 07/03/2020 1006 Gross per 24 hour  Intake 480 ml  Output --  Net 480 ml      PHYSICAL EXAM  General: Well developed, well nourished, in no acute distress HEENT:  Normocephalic and atramatic Neck:  No JVD.  Lungs: Clear bilaterally to auscultation and percussion. Heart: HRRR . Normal S1 and  S2 without gallops or murmurs.  Abdomen: Bowel sounds are positive, abdomen soft and non-tender  Msk:  Back normal.  Normal strength and tone for age. Extremities: No clubbing, cyanosis or edema.   Neuro: Alert and oriented X 3. Psych:  Good affect, responds appropriately   LABS: Basic Metabolic Panel: Recent Labs    07/01/20 1555 07/02/20 0434  NA 135 140  K 4.4 3.9  CL 98 105  CO2 24 27  GLUCOSE 90 100*  BUN 16 17  CREATININE 1.40* 1.39*  CALCIUM 9.1 9.0   Liver Function Tests: No results for input(s): AST, ALT, ALKPHOS, BILITOT, PROT, ALBUMIN in the last 72 hours. No results for input(s): LIPASE, AMYLASE in the last 72 hours. CBC: Recent Labs    07/01/20 1555 07/02/20 0434  WBC 9.0 9.2  HGB 14.5 13.0  HCT 43.1 38.8  MCV 89.2 88.4  PLT 265 216   Cardiac Enzymes: No results for input(s): CKTOTAL, CKMB, CKMBINDEX, TROPONINI in the last 72 hours. BNP: Invalid input(s): POCBNP D-Dimer: No results for input(s): DDIMER in the last 72 hours. Hemoglobin A1C: No results for input(s): HGBA1C in the last 72 hours. Fasting Lipid Panel: No results for input(s): CHOL, HDL, LDLCALC, TRIG, CHOLHDL, LDLDIRECT in the last 72 hours. Thyroid Function Tests: No results for input(s): TSH, T4TOTAL, T3FREE, THYROIDAB in the last 72 hours.  Invalid input(s): FREET3 Anemia Panel: No results for input(s): VITAMINB12, FOLATE, FERRITIN, TIBC, IRON, RETICCTPCT in the last 72 hours.  DG  Chest 2 View  Result Date: 07/01/2020 CLINICAL DATA:  Bradycardia, syncopal episodes, low blood pressure; past history of hypertension, dementia, Parkinson's EXAM: CHEST - 2 VIEW COMPARISON:  05/26/2016 FINDINGS: Slight rotation of the shoulders to the RIGHT. Normal heart size, mediastinal contours, and pulmonary vascularity. Atherosclerotic calcification aorta. Eventration anterior RIGHT diaphragm unchanged. Scarring at RIGHT middle lobe. No pulmonary infiltrate, pleural effusion or pneumothorax. Diffuse  osseous demineralization. IMPRESSION: RIGHT middle lobe scarring. No acute abnormalities. Aortic Atherosclerosis (ICD10-I70.0). Electronically Signed   By: Lavonia Dana M.D.   On: 07/01/2020 16:45   ECHOCARDIOGRAM COMPLETE  Result Date: 07/03/2020    ECHOCARDIOGRAM REPORT   Patient Name:   Nancy Blackburn Salim Date of Exam: 07/03/2020 Medical Rec #:  [id number]            Height:       67.0 in Accession #:    [id number]           Weight:       174.0 lb Date of Birth:  1946-11-30           BSA:          1.906 m Patient Age:    50 years             BP:           136/68 mmHg Patient Gender: F                    HR:           81 bpm. Exam Location:  [facility] Procedure: 2D Echo, Cardiac Doppler and Color Doppler Indications:     Syncope 780.2  History:         Patient has prior history of Echocardiogram examinations, most                  recent 04/15/2015. Risk Factors:Hypertension. Pericardial                  effusion.  Sonographer:     Sherrie Sport RDCS (AE) Referring Phys:  [id number] Arvella Merles MANSY Diagnosing Phys: Bartholome Bill MD IMPRESSIONS  1. Left ventricular ejection fraction, by estimation, is 60 to 65%. The left ventricle has normal function. The left ventricle has no regional wall motion abnormalities. There is mild left ventricular hypertrophy. Left ventricular diastolic parameters are consistent with Grade I diastolic dysfunction (impaired relaxation).  2. Right ventricular systolic function is normal. The right ventricular size is normal. There is normal pulmonary artery systolic pressure.  3. The mitral valve was not well visualized. Trivial mitral valve regurgitation.  4. The aortic valve was not well visualized. Aortic valve regurgitation is not visualized. FINDINGS  Left Ventricle: Left ventricular ejection fraction, by estimation, is 60 to 65%. The left ventricle has normal function. The left ventricle has no regional wall motion abnormalities. The left ventricular internal cavity size was normal in size. There  is  mild left ventricular hypertrophy. Left ventricular diastolic parameters are consistent with Grade I diastolic dysfunction (impaired relaxation). Right Ventricle: The right ventricular size is normal. No increase in right ventricular wall thickness. Right ventricular systolic function is normal. There is normal pulmonary artery systolic pressure. The tricuspid regurgitant velocity is 2.36 m/s, and  with an assumed right atrial pressure of 10 mmHg, the estimated right ventricular systolic pressure is 29.9 mmHg. Left Atrium: Left atrial size was normal in size. Right Atrium: Right atrial size was normal in size. Pericardium: There is no evidence of  pericardial effusion. Mitral Valve: The mitral valve was not well visualized. Trivial mitral valve regurgitation. Tricuspid Valve: The tricuspid valve is not well visualized. Tricuspid valve regurgitation is trivial. Aortic Valve: The aortic valve was not well visualized. Aortic valve regurgitation is not visualized. Aortic valve mean gradient measures 6.5 mmHg. Aortic valve peak gradient measures 10.6 mmHg. Aortic valve area, by VTI measures 2.16 cm. Pulmonic Valve: The pulmonic valve was not well visualized. Pulmonic valve regurgitation is not visualized. Aorta: The aortic root is normal in size and structure. IAS/Shunts: The interatrial septum was not assessed.  LEFT VENTRICLE PLAX 2D LVIDd:         3.70 cm  Diastology LVIDs:         2.43 cm  LV e' lateral:   7.62 cm/s LV PW:         1.56 cm  LV E/e' lateral: 9.1 LV IVS:        1.19 cm  LV e' medial:    6.20 cm/s LVOT diam:     2.20 cm  LV E/e' medial:  11.2 LV SV:         67 LV SV Index:   35 LVOT Area:     3.80 cm  RIGHT VENTRICLE RV Basal diam:  2.25 cm LEFT ATRIUM             Index       RIGHT ATRIUM           Index LA diam:        2.50 cm 1.31 cm/m  RA Area:     14.90 cm LA Vol (A2C):   88.5 ml 46.43 ml/m RA Volume:   34.50 ml  18.10 ml/m LA Vol (A4C):   40.6 ml 21.30 ml/m LA Biplane Vol: 63.6 ml 33.37  ml/m  AORTIC VALVE                    PULMONIC VALVE AV Area (Vmax):    3.87 cm     RVOT Peak grad: 2 mmHg AV Area (Vmean):   1.93 cm AV Area (VTI):     2.16 cm AV Vmax:           163.00 cm/s AV Vmean:          123.500 cm/s AV VTI:            0.312 m AV Peak Grad:      10.6 mmHg AV Mean Grad:      6.5 mmHg LVOT Vmax:         166.00 cm/s LVOT Vmean:        62.600 cm/s LVOT VTI:          0.177 m LVOT/AV VTI ratio: 0.57  AORTA Ao Root diam: 2.90 cm MITRAL VALVE                TRICUSPID VALVE MV Area (PHT): 2.33 cm     TR Peak grad:   22.3 mmHg MV Decel Time: 325 msec     TR Vmax:        236.00 cm/s MV E velocity: 69.40 cm/s MV A velocity: 107.00 cm/s  SHUNTS MV E/A ratio:  0.65         Systemic VTI:  0.18 m                             Systemic Diam: 2.20 cm Bartholome Bill MD Electronically signed by  Bartholome Bill MD Signature Date/Time: 07/03/2020/8:49:49 AM    Final      Echo: Normal RV and LV systolic function with an EF estimated between 60-65% with mild LVH, grade 1 diastolic dysfunction; no significant valvular abnormalities noted   TELEMETRY: Normal sinus rhythm; rate in the 80s   ASSESSMENT AND PLAN:  Active Problems:   Postural dizziness with presyncope   1.  Syncope              -Likely in the setting of bradycardia and hypotension; would agree to continue to hold propranolol and Aricept              -Ruled out for ACS as troponin x 3 were negative              -Echocardiogram revealed normal LV systolic function with no evidence of wall motion abnormalities or significant valvular disease   -Patient remains asymptomatic with no recurrent syncopal or presyncopal episodes; would recommend discharge today with f/u with PCP and neurology within a few weeks of discharge to consider alternative medications for her tremors/Parkinsonism with dementia  2.  Benign essential tremors with Parkinsonism              -Would continue to hold Aricept and propranolol   3.  Hyperlipidemia               -Continue rosuvastatin, colestipol    The history, physical exam findings, and plan of care were all discussed with Dr. Bartholome Bill, and all decision making was made in collaboration.   Avie Arenas  PA-C 07/03/2020 10:11 AM

## 2020-07-03 NOTE — Care Management Obs Status (Signed)
[place] NOTIFICATION   Patient Details  Name: Nancy Blackburn MRN: [id number] Date of Birth: 1946/03/07   Medicare Observation Status Notification Given:  Yes    Shelbie Ammons, RN 07/03/2020, 9:52 AM

## 2020-07-03 NOTE — Progress Notes (Signed)
Established patient visit  I,Nancy Blackburn,acting as a scribe for Nancy Durie, MD.,have documented all relevant documentation on the behalf of Nancy Durie, MD,as directed by  Nancy Durie, MD while in the presence of Nancy Durie, MD.   [name]   DOB: 25-Jan-1946   74 y.o. Female  MRN: [id number] Visit Date: 07/07/2020  Today's healthcare provider: Wilhemena Durie, MD   Chief Complaint  Patient presents with  . Follow-up   Subjective    HPI  Previous syncope was called to be secondary to severe bradycardia.  This is resolved off of Inderal and Aricept. Syncope, unspecified syncope type From 07/01/2020-Discussed with cardiology and they are in agreement that she needs to be admitted.  Have contacted the ED and will send her there for admission.  Bradycardia From 07/01/2020-Need to stop propanolol.  Parkinson's disease ([place]) From 07/01/2020-Parkinsonism followed by neurology.  Stop propanolol as of today.  Weakness generalized Due to hypotension and syncope.  IV fluids may help.   Patient states she is still feeling a little weak.       Medications: Outpatient Medications Prior to Visit  Medication Sig  . busPIRone (BUSPAR) 10 MG tablet Take 10 mg by mouth 2 (two) times daily.   . citalopram (CELEXA) 40 MG tablet 40 mg daily.   . colestipol (COLESTID) 1 g tablet Take 1 g by mouth 2 (two) times daily. Up to 3 times a day  . nitrofurantoin, macrocrystal-monohydrate, (MACROBID) 100 MG capsule   . primidone (MYSOLINE) 50 MG tablet Take 50 mg by mouth at bedtime.  . rosuvastatin (CRESTOR) 10 MG tablet Take 1 tablet (10 mg total) by mouth daily.  . traZODone (DESYREL) 100 MG tablet 2 Tablet(s) By Mouth Every Night PRN  . vitamin B-12 (CYANOCOBALAMIN) 1000 MCG tablet Take 1,000 mcg by mouth daily.   No facility-administered medications prior to visit.    Review of Systems  Constitutional: Negative for appetite change,  chills, fatigue and fever.  Respiratory: Negative for chest tightness and shortness of breath.   Cardiovascular: Negative for chest pain and palpitations.  Gastrointestinal: Negative for abdominal pain, nausea and vomiting.  Neurological: Negative for dizziness and weakness.       Objective    BP 109/72 (BP Location: Right Arm, Patient Position: Sitting, Cuff Size: Large)   Pulse 76   Temp (!) 97.3 F (36.3 C) (Other (Comment))   Resp 18   Ht 5\' 7"  (1.702 m)   Wt 176 lb (79.8 kg)   SpO2 96%   BMI 27.57 kg/m  BP Readings from Last 3 Encounters:  07/07/20 109/72  07/03/20 (!) 151/72  07/01/20 (!) 91/59   Wt Readings from Last 3 Encounters:  07/07/20 176 lb (79.8 kg)  07/01/20 174 lb (78.9 kg)  07/01/20 174 lb 3.2 oz (79 kg)      Physical Exam Vitals reviewed.  Constitutional:      Appearance: Normal appearance. She is well-developed.  HENT:     Head: Normocephalic and atraumatic.     Right Ear: External ear normal.     Left Ear: External ear normal.     Nose: Nose normal.  Eyes:     General: No scleral icterus.    Conjunctiva/sclera: Conjunctivae normal.  Neck:     Thyroid: No thyromegaly.  Cardiovascular:     Rate and Rhythm: Normal rate and regular rhythm.     Heart sounds: Normal heart sounds.  Pulmonary:  Effort: Pulmonary effort is normal.     Breath sounds: Normal breath sounds.  Chest:     Breasts: Breasts are symmetrical.        Right: Normal.        Left: Normal.  Abdominal:     Palpations: Abdomen is soft.  Skin:    General: Skin is warm and dry.  Neurological:     Mental Status: She is alert and oriented to person, place, and time. Mental status is at baseline.     Comments: Masked facies. Mild Shuffling gait  Psychiatric:        Mood and Affect: Mood normal.        Behavior: Behavior normal.        Thought Content: Thought content normal.        Judgment: Judgment normal.       No results found for any visits on 07/07/20.   Assessment & Plan     1. Syncope, cardiogenic Patient doing much better off of beta-blocker and Aricept.  2. Parkinson's disease ([place]) Patient is getting weaker. - Ambulatory referral to Neurology  3. Benign essential tremor Worse off of propanolol - Ambulatory referral to Neurology  4. Bradycardia Resolved  5. Weakness generalized Generalized and progressive issue.  I think mainly due to neurologic issues. Follow-up in 1 to 2 months. 6.  Major depression Followed by Dr. Nicolasa Blackburn.  Off of Abilify for 2 months.   Return in about 2 months (around 09/07/2020).         Nancy Blackburn Mon, MD  [facility] [phone] (phone) [phone] (fax)  [place]

## 2020-07-03 NOTE — Evaluation (Signed)
Physical Therapy Evaluation Patient Details Name: Nancy Blackburn MRN: [id number] DOB: 08-22-1946 Today's Date: 07/03/2020   History of Present Illness  presented to ER and admitted under observation due to recurrent pre-syncope and syncope in home environment.  Clinical Impression  Upon evaluation, patient alert and oriented; follows commands and demonstrates good effort with mobility tasks. Eager for upcoming discharge home.  Reports noted improvement in presenting symptoms, does report in-room mobility (to/from bathroom) without presyncopal symptoms.  Bilat UE/LE strength and ROM grossly symmetrical and WFL; no focal weakness appreciated.  Generally flat affect.  Able to complete bed mobility with indep; sit/stand, basic transfers and gait (200') without assist device, mod indep.  Demonstrates decreased step height/length, guarded trunk posturing with limited arm swing; improves with cuing, but unable to maintain indep.  Reports performance is baseline for her; denies need for PT intervention at this time.  Of note, orthostatic vitals assessed during session.  Mild drop in SBP with transition to upright (see vitals flowsheet for results), but patient asymptomatic and appears to stabilize with sustained standing/gait efforts.  Did review safety precautions to manage orthostatic hypotension (slow transitions, brief pauses with position change); patient voiced understanding of all information. Initial order completed at this time, as patient without acute need for PT services.  Please re-consult should needs change.    Follow Up Recommendations No PT follow up    Equipment Recommendations       Recommendations for Other Services       Precautions / Restrictions Precautions Precautions: Fall Restrictions Weight Bearing Restrictions: No      Mobility  Bed Mobility Overal bed mobility: Independent                Transfers Overall transfer level: Modified  independent Equipment used: None             General transfer comment: does require UE support to complete lift off from bed surface  Ambulation/Gait Ambulation/Gait assistance: Supervision;Modified independent (Device/Increase time) Gait Distance (Feet): 200 Feet Assistive device: None   Gait velocity: 10' walk time, 7-8 seconds   General Gait Details: decreased step height/length, guarded trunk posturing with limited arm swing; improves with cuing, but unable to maintain indep  Stairs            Wheelchair Mobility    Modified Rankin (Stroke Patients Only)       Balance   Sitting-balance support: Feet supported;No upper extremity supported Sitting balance-Leahy Scale: Normal     Standing balance support: No upper extremity supported Standing balance-Leahy Scale: Good Standing balance comment: functional reach approx 6" from immediate BOS; good awareness of limits of stability and overall safety needs; good use of compensatory strategies as needed                             Pertinent Vitals/Pain Pain Assessment: No/denies pain    Home Living Family/patient expects to be discharged to:: Private residence Living Arrangements:  (grandson, works outside of the home) Available Help at Discharge: Family Type of Home: House Home Access: Stairs to enter Entrance Stairs-Rails: Left Entrance Stairs-Number of Steps: 6-7 Home Layout: One level        Prior Function Level of Independence: Independent         Comments: Indep with ADLs, household and community mobilization without assist device; denies fall history, but does endorse "trouble walking sometimes" due to Parkinson's     Hand Dominance  Extremity/Trunk Assessment   Upper Extremity Assessment Upper Extremity Assessment: Overall WFL for tasks assessed    Lower Extremity Assessment Lower Extremity Assessment: Overall WFL for tasks assessed       Communication    Communication: No difficulties  Cognition Arousal/Alertness: Awake/alert Behavior During Therapy: WFL for tasks assessed/performed Overall Cognitive Status: Within Functional Limits for tasks assessed                                        General Comments      Exercises  Orthostatic vitals assessment; see vitals flowsheet for details.  Mild drop in SBP with transition to upright (see vitals flowsheet for results), but patient asymptomatic and appears to stabilize with sustained standing/gait efforts.  Did review safety precautions to manage orthostatic hypotension (slow transitions, brief pauses with position change); patient voiced understanding of all information.   Assessment/Plan    PT Assessment Patent does not need any further PT services  PT Problem List Decreased balance;Decreased mobility       PT Treatment Interventions      PT Goals (Current goals can be found in the Care Plan section)  Acute Rehab PT Goals Patient Stated Goal: to return home PT Goal Formulation: All assessment and education complete, DC therapy Time For Goal Achievement: 07/03/20 Potential to Achieve Goals: Good    Frequency     Barriers to discharge        Co-evaluation               AM-PAC PT "6 Clicks" Mobility  Outcome Measure Help needed turning from your back to your side while in a flat bed without using bedrails?: None Help needed moving from lying on your back to sitting on the side of a flat bed without using bedrails?: None Help needed moving to and from a bed to a chair (including a wheelchair)?: None Help needed standing up from a chair using your arms (e.g., wheelchair or bedside chair)?: None Help needed to walk in hospital room?: None Help needed climbing 3-5 steps with a railing? : None 6 Click Score: 24    End of Session Equipment Utilized During Treatment: Gait belt Activity Tolerance: Patient tolerated treatment well Patient left: in chair;with  call bell/phone within reach;with chair alarm set Nurse Communication: Mobility status PT Visit Diagnosis: Muscle weakness (generalized) (M62.81);Difficulty in walking, not elsewhere classified (R26.2)    Time: 7858-8502 PT Time Calculation (min) (ACUTE ONLY): 17 min   Charges:   PT Evaluation $PT Eval Moderate Complexity: 1 Mod          Marissa Weaver H. Owens Shark, PT, DPT, NCS 07/03/20, 2:29 PM [phone]

## 2020-07-03 NOTE — Progress Notes (Signed)
*  PRELIMINARY RESULTS* Echocardiogram 2D Echocardiogram has been performed.  Nancy Blackburn 07/03/2020, 7:53 AM

## 2020-07-07 ENCOUNTER — Encounter: Payer: Self-pay | Admitting: Family Medicine

## 2020-07-07 ENCOUNTER — Ambulatory Visit (INDEPENDENT_AMBULATORY_CARE_PROVIDER_SITE_OTHER): Payer: Medicare Other | Admitting: Family Medicine

## 2020-07-07 ENCOUNTER — Other Ambulatory Visit: Payer: Self-pay

## 2020-07-07 VITALS — BP 109/72 | HR 76 | Temp 97.3°F | Resp 18 | Ht 67.0 in | Wt 176.0 lb

## 2020-07-07 DIAGNOSIS — G250 Essential tremor: Secondary | ICD-10-CM

## 2020-07-07 DIAGNOSIS — R001 Bradycardia, unspecified: Secondary | ICD-10-CM

## 2020-07-07 DIAGNOSIS — G20 Parkinson's disease: Secondary | ICD-10-CM | POA: Diagnosis not present

## 2020-07-07 DIAGNOSIS — F3341 Major depressive disorder, recurrent, in partial remission: Secondary | ICD-10-CM | POA: Diagnosis not present

## 2020-07-07 DIAGNOSIS — R531 Weakness: Secondary | ICD-10-CM | POA: Diagnosis not present

## 2020-07-07 DIAGNOSIS — R55 Syncope and collapse: Secondary | ICD-10-CM

## 2020-07-14 ENCOUNTER — Telehealth: Payer: Medicare Other

## 2020-08-05 DIAGNOSIS — R259 Unspecified abnormal involuntary movements: Secondary | ICD-10-CM | POA: Diagnosis not present

## 2020-08-05 DIAGNOSIS — G3184 Mild cognitive impairment, so stated: Secondary | ICD-10-CM | POA: Diagnosis not present

## 2020-08-05 DIAGNOSIS — G250 Essential tremor: Secondary | ICD-10-CM | POA: Diagnosis not present

## 2020-08-05 DIAGNOSIS — E538 Deficiency of other specified B group vitamins: Secondary | ICD-10-CM | POA: Diagnosis not present

## 2020-08-27 ENCOUNTER — Ambulatory Visit (INDEPENDENT_AMBULATORY_CARE_PROVIDER_SITE_OTHER): Payer: Medicare Other | Admitting: Family Medicine

## 2020-08-27 ENCOUNTER — Other Ambulatory Visit: Payer: Self-pay

## 2020-08-27 ENCOUNTER — Encounter: Payer: Self-pay | Admitting: Family Medicine

## 2020-08-27 VITALS — BP 148/78 | HR 79 | Temp 99.3°F | Wt 178.0 lb

## 2020-08-27 DIAGNOSIS — I499 Cardiac arrhythmia, unspecified: Secondary | ICD-10-CM | POA: Diagnosis not present

## 2020-08-27 DIAGNOSIS — F3341 Major depressive disorder, recurrent, in partial remission: Secondary | ICD-10-CM | POA: Diagnosis not present

## 2020-08-27 DIAGNOSIS — R197 Diarrhea, unspecified: Secondary | ICD-10-CM

## 2020-08-27 DIAGNOSIS — R55 Syncope and collapse: Secondary | ICD-10-CM

## 2020-08-27 DIAGNOSIS — G20 Parkinson's disease: Secondary | ICD-10-CM

## 2020-08-27 DIAGNOSIS — E538 Deficiency of other specified B group vitamins: Secondary | ICD-10-CM

## 2020-08-27 DIAGNOSIS — R001 Bradycardia, unspecified: Secondary | ICD-10-CM | POA: Diagnosis not present

## 2020-08-27 NOTE — Patient Instructions (Signed)
Stay Hydrated. Try Metamucil 1 dose every morning.

## 2020-08-27 NOTE — Progress Notes (Signed)
Established patient visit   [name]   DOB: [birth year]   74 y.o. Female  MRN: [id number] Visit Date: 08/27/2020  Today's healthcare provider: Wilhemena Durie, MD   Chief Complaint  Patient presents with  . Fatigue   Subjective    HPI Patient presents today c/o feeling fatigued and weak X 2 days. She reports that she has been monitoring her BP and pulse, and her pulse was 42. She also mentions feeling more short of breath than usual.  No exertional chest pain.  She overall just does not feel well but does not feel as bad as when she had her syncopal episode 2 months ago.  She is off of propanolol and Aricept..     Medications: Outpatient Medications Prior to Visit  Medication Sig  . busPIRone (BUSPAR) 10 MG tablet Take 10 mg by mouth 2 (two) times daily.   . citalopram (CELEXA) 40 MG tablet 40 mg daily.   . colestipol (COLESTID) 1 g tablet Take 1 g by mouth 2 (two) times daily. Up to 3 times a day  . primidone (MYSOLINE) 50 MG tablet Take 50 mg by mouth at bedtime.  . rosuvastatin (CRESTOR) 10 MG tablet Take 1 tablet (10 mg total) by mouth daily.  . traZODone (DESYREL) 100 MG tablet 2 Tablet(s) By Mouth Every Night PRN  . vitamin B-12 (CYANOCOBALAMIN) 1000 MCG tablet Take 1,000 mcg by mouth daily.  . nitrofurantoin, macrocrystal-monohydrate, (MACROBID) 100 MG capsule  (Patient not taking: Reported on 08/27/2020)   No facility-administered medications prior to visit.    Review of Systems  Constitutional: Positive for activity change and fatigue.  HENT: Negative.   Respiratory: Positive for shortness of breath.   Cardiovascular: Negative for chest pain, palpitations and leg swelling.  Musculoskeletal: Negative for myalgias, neck pain and neck stiffness.  Neurological: Positive for tremors, weakness, light-headedness and headaches.       Objective    BP (!) 148/78 (BP Location: Right Arm, Patient Position: Supine)   Pulse 79   Temp 99.3 F  (37.4 C)   Wt 178 lb (80.7 kg)   BMI 27.88 kg/m  BP Readings from Last 3 Encounters:  08/27/20 (!) 148/78  07/07/20 109/72  07/03/20 (!) 151/72   Wt Readings from Last 3 Encounters:  08/27/20 178 lb (80.7 kg)  07/07/20 176 lb (79.8 kg)  07/01/20 174 lb (78.9 kg)      Physical Exam Vitals reviewed.  Constitutional:      Appearance: Normal appearance. She is well-developed.  HENT:     Head: Normocephalic and atraumatic.     Right Ear: External ear normal.     Left Ear: External ear normal.     Nose: Nose normal.  Eyes:     General: No scleral icterus.    Conjunctiva/sclera: Conjunctivae normal.  Neck:     Thyroid: No thyromegaly.  Cardiovascular:     Rate and Rhythm: Normal rate and regular rhythm.     Heart sounds: Normal heart sounds.  Pulmonary:     Effort: Pulmonary effort is normal.     Breath sounds: Normal breath sounds.  Chest:     Breasts: Breasts are symmetrical.        Right: Normal.        Left: Normal.  Abdominal:     Palpations: Abdomen is soft.  Skin:    General: Skin is warm and dry.  Neurological:     Mental Status: She is alert and  oriented to person, place, and time. Mental status is at baseline.     Comments: Masked facies. Mild Shuffling gait  Psychiatric:        Mood and Affect: Mood normal.        Behavior: Behavior normal.        Thought Content: Thought content normal.        Judgment: Judgment normal.     ECG reveals frequent PVCs with low voltage and no STTWCs.Rate about 80  No results found for any visits on 08/27/20.  Assessment & Plan     1. Bradycardia  Better off of prpanalol - EKG 12-Lead - Ambulatory referral to Cardiology  2. Syncope, cardiogenic She is still having presyncopal symptoms.  Refer to cardiology for evaluation.  She may need a loop monitor or echocardiogram, work-up per cardiology - Ambulatory referral to Cardiology  3. Parkinson's disease ([place]) Followed by neurology  4. Depression, major,  recurrent, in partial remission ([place]) Followed by psychiatry - TSH  5. Vitamin B12 deficiency  - Vitamin B12  6. Diarrhea, unspecified type  - CBC with Differential/Platelet - Comprehensive metabolic panel  7. Cardiac arrhythmia, unspecified cardiac arrhythmia type/PVCs New PVCs today - Ambulatory referral to Cardiology   No follow-ups on file.      I, Wilhemena Durie, MD, have reviewed all documentation for this visit. The documentation on 08/31/20 for the exam, diagnosis, procedures, and orders are all accurate and complete.    Richard Cranford Mon, MD  [facility] [phone] (phone) [phone] (fax)  [place]

## 2020-08-28 ENCOUNTER — Telehealth: Payer: Self-pay

## 2020-08-28 LAB — COMPREHENSIVE METABOLIC PANEL
ALT: 12 IU/L (ref 0–32)
AST: 14 IU/L (ref 0–40)
Albumin/Globulin Ratio: 1.6 (ref 1.2–2.2)
Albumin: 3.6 g/dL — ABNORMAL LOW (ref 3.7–4.7)
Alkaline Phosphatase: 117 IU/L (ref 44–121)
BUN/Creatinine Ratio: 14 (ref 12–28)
BUN: 13 mg/dL (ref 8–27)
Bilirubin Total: 0.2 mg/dL (ref 0.0–1.2)
CO2: 23 mmol/L (ref 20–29)
Calcium: 9 mg/dL (ref 8.7–10.3)
Chloride: 102 mmol/L (ref 96–106)
Creatinine, Ser: 0.91 mg/dL (ref 0.57–1.00)
GFR calc Af Amer: 72 mL/min/{1.73_m2} (ref >59)
GFR calc non Af Amer: 62 mL/min/{1.73_m2} (ref >59)
Globulin, Total: 2.2 g/dL (ref 1.5–4.5)
Glucose: 92 mg/dL (ref 65–99)
Potassium: 4 mmol/L (ref 3.5–5.2)
Sodium: 139 mmol/L (ref 134–144)
Total Protein: 5.8 g/dL — ABNORMAL LOW (ref 6.0–8.5)

## 2020-08-28 LAB — CBC WITH DIFFERENTIAL/PLATELET
Basophils Absolute: 0.1 10*3/uL (ref 0.0–0.2)
Basos: 1 %
EOS (ABSOLUTE): 0.2 10*3/uL (ref 0.0–0.4)
Eos: 2 %
Hematocrit: 42.6 % (ref 34.0–46.6)
Hemoglobin: 14.1 g/dL (ref 11.1–15.9)
Immature Grans (Abs): 0 10*3/uL (ref 0.0–0.1)
Immature Granulocytes: 0 %
Lymphocytes Absolute: 3.5 10*3/uL — ABNORMAL HIGH (ref 0.7–3.1)
Lymphs: 35 %
MCH: 29.8 pg (ref 26.6–33.0)
MCHC: 33.1 g/dL (ref 31.5–35.7)
MCV: 90 fL (ref 79–97)
Monocytes Absolute: 1.2 10*3/uL — ABNORMAL HIGH (ref 0.1–0.9)
Monocytes: 12 %
Neutrophils Absolute: 5 10*3/uL (ref 1.4–7.0)
Neutrophils: 50 %
Platelets: 252 10*3/uL (ref 150–450)
RBC: 4.73 x10E6/uL (ref 3.77–5.28)
RDW: 12.2 % (ref 11.7–15.4)
WBC: 10 10*3/uL (ref 3.4–10.8)

## 2020-08-28 LAB — TSH: TSH: 1.33 u[IU]/mL (ref 0.450–4.500)

## 2020-08-28 LAB — VITAMIN B12: Vitamin B-12: 269 pg/mL (ref 232–1245)

## 2020-08-28 NOTE — Telephone Encounter (Signed)
-----   Message from Jerrol Banana., MD sent at 08/28/2020 10:11 AM EDT ----- Labs in normal range.

## 2020-08-28 NOTE — Telephone Encounter (Signed)
Patient advised of labs via mychart and has read the providers comments.

## 2020-08-31 ENCOUNTER — Ambulatory Visit (INDEPENDENT_AMBULATORY_CARE_PROVIDER_SITE_OTHER): Payer: Medicare Other

## 2020-08-31 ENCOUNTER — Encounter: Payer: Self-pay | Admitting: Cardiology

## 2020-08-31 ENCOUNTER — Other Ambulatory Visit: Payer: Self-pay

## 2020-08-31 ENCOUNTER — Ambulatory Visit (INDEPENDENT_AMBULATORY_CARE_PROVIDER_SITE_OTHER): Payer: Medicare Other | Admitting: Cardiology

## 2020-08-31 VITALS — BP 143/74 | HR 70 | Ht 67.0 in | Wt 176.5 lb

## 2020-08-31 DIAGNOSIS — R55 Syncope and collapse: Secondary | ICD-10-CM

## 2020-08-31 DIAGNOSIS — R001 Bradycardia, unspecified: Secondary | ICD-10-CM

## 2020-08-31 DIAGNOSIS — E7800 Pure hypercholesterolemia, unspecified: Secondary | ICD-10-CM

## 2020-08-31 NOTE — Progress Notes (Signed)
Cardiology Office Note:    Date:  08/31/2020   ID:  Donnah, Levert 27-Apr-1946, MRN [id number]  PCP:  Jerrol Banana., MD  [facility] Cardiologist:  Kate Sable, MD  [place] Electrophysiologist:  None   Referring MD: Jerrol Banana.,*   Chief Complaint  Patient presents with  . New Patient (Initial Visit)    Referred by Dr. Rosanna Randy for evaluation of bradycardia/syncope; Meds verbally reviewed with patient.    History of Present Illness:    Nancy Blackburn is a 74 y.o. female with a hx of hyperlipidemia, Parkinson's disease, mild cognitive impairment, who presents due to bradycardia and syncope.  Patient was evaluated in the hospital from 7/21-7/23 due to dizziness, presyncope and bradycardia.  Prior to admission, she notes episodes of dizziness and passing out.  Saw her primary care on day of admission and was noted to be bradycardic with heart rate 38.  EKG in the hospital on 7/21 showed marked bradycardia heart rate 37.  Patient was also hypotensive with BP 91/58 upon chart review.  She used to be on propanolol and Aricept which were discontinued. Echo on 07/03/2020 showed normal systolic function, EF 60 to 65%, impaired relaxation.  Her heart rates improved with subsequent EKG showing sinus bradycardia heart rate 55, and subsequently stayed consistently in the 70s after discontinuation of propanolol and Aricept.  Had no symptoms with ambulation and was discharged.  Since discharge, she denies dizziness and syncope but states her heart rate is "all over the place", with occasional low heart rates in the 40s sometimes elevated in the 110s, associated with diaphoresis.  States her symptoms have overall improved but still notices low heart rates.  Past Medical History:  Diagnosis Date  . Chronic sinus infection   . Ejection fraction    a. EF 55-65%, echo, mild LVH, June, 2013, small pericardial effusion  . Fracture, sacrum/coccyx ([place])   .  Hyperlipidemia    a. takes Simvastatin  . Hypertension    a. takes Metoprolol, Lisinopril, and amlodipine.  . Low back pain   . Mild dementia ([place])   . Nasal polyps   . Normal nuclear stress test    a. 07/2005 Nl nuc stress test.  . Osteopenia   . Osteoporosis   . Palpitations    a. June 2013:  Event recorder normal sinus rhythm with rare PVCs - ? symptomatic PVC's.  . Parkinson disease ([place])   . Pericardial effusion    a. Small, echo, June, 2013  . PONV (postoperative nausea and vomiting)     Past Surgical History:  Procedure Laterality Date  . BACK SURGERY  08/2010  . BLADDER SUSPENSION  2003  . bone spur  2009   removed from right collar bone area and partial collar bone removed  . THYROIDECTOMY, PARTIAL    . TONSILLECTOMY     as child  . TUBAL LIGATION     27yrs ago  . VAGINAL HYSTERECTOMY     96yrs ago d/t cervical cancer    Current Medications: No outpatient medications have been marked as taking for the 08/31/20 encounter (Office Visit) with Kate Sable, MD.     Allergies:   Anti-inflammatory enzyme [nutritional supplements], Cymbalta [duloxetine hcl], Clarithromycin, Donepezil hcl, Nsaids, Ranitidine, and Venlafaxine   Social History   Socioeconomic History  . Marital status: Divorced    Spouse name: Not on file  . Number of children: 2  . Years of education: Not on file  .  Highest education level: 12th grade  Occupational History  . Occupation: retired  Tobacco Use  . Smoking status: Never Smoker  . Smokeless tobacco: Never Used  Vaping Use  . Vaping Use: Never used  Substance and Sexual Activity  . Alcohol use: Yes    Alcohol/week: 0.0 standard drinks    Comment: drinks 3-4 times a year  . Drug use: No  . Sexual activity: Never  Other Topics Concern  . Not on file  Social History Narrative  . Not on file   Social Determinants of Health   Financial Resource Strain: Low Risk   . Difficulty of Paying Living Expenses: Not hard at all    Food Insecurity: No Food Insecurity  . Worried About Charity fundraiser in the Last Year: Never true  . Ran Out of Food in the Last Year: Never true  Transportation Needs: No Transportation Needs  . Lack of Transportation (Medical): No  . Lack of Transportation (Non-Medical): No  Physical Activity: Insufficiently Active  . Days of Exercise per Week: 2 days  . Minutes of Exercise per Session: 60 min  Stress: No Stress Concern Present  . Feeling of Stress : Not at all  Social Connections: Moderately Integrated  . Frequency of Communication with Friends and Family: More than three times a week  . Frequency of Social Gatherings with Friends and Family: More than three times a week  . Attends Religious Services: More than 4 times per year  . Active Member of Clubs or Organizations: Yes  . Attends Archivist Meetings: More than 4 times per year  . Marital Status: Divorced     Family History: The patient's family history includes Angina in her mother; Atrial fibrillation in her brother; Breast cancer in her cousin; COPD in her father; Cancer in her daughter; Colon cancer in her mother; Cushing syndrome in her daughter; Heart disease in her father and mother; Heart murmur in her mother. There is no history of Hypotension, Malignant hyperthermia, or Pseudochol deficiency.  ROS:   Please see the history of present illness.     All other systems reviewed and are negative.  EKGs/Labs/Other Studies Reviewed:    The following studies were reviewed today:   EKG:  EKG is  ordered today.  The ekg ordered today demonstrates sinus rhythm, occasional PVCs, heart rate 74  Recent Labs: 08/27/2020: ALT 12; BUN 13; Creatinine, Ser 0.91; Hemoglobin 14.1; Platelets 252; Potassium 4.0; Sodium 139; TSH 1.330  Recent Lipid Panel    Component Value Date/Time   CHOL 200 (H) 04/12/2019 1116   TRIG 154 (H) 04/12/2019 1116   HDL 59 04/12/2019 1116   CHOLHDL 3.4 04/12/2019 1116   LDLCALC 110 (H)  04/12/2019 1116    Physical Exam:    VS:  BP (!) 143/74 (BP Location: Right Arm, Patient Position: Sitting, Cuff Size: Normal)   Pulse 70   Ht 5\' 7"  (1.702 m)   Wt 176 lb 8 oz (80.1 kg)   SpO2 98%   BMI 27.64 kg/m     Wt Readings from Last 3 Encounters:  04/17/20 176 lb 8 oz (80.1 kg)  08/27/20 178 lb (80.7 kg)  07/07/20 176 lb (79.8 kg)     GEN:  Well nourished, well developed in no acute distress, occasional tremors HEENT: Normal NECK: No JVD; No carotid bruits LYMPHATICS: No lymphadenopathy CARDIAC: RRR, no murmurs, rubs, gallops RESPIRATORY:  Clear to auscultation without rales, wheezing or rhonchi  ABDOMEN: Soft, non-tender, non-distended MUSCULOSKELETAL:  No edema; No deformity  SKIN: Warm and dry NEUROLOGIC:  Alert and oriented x 3, resting tremors noted PSYCHIATRIC:  Normal affect   ASSESSMENT:    1. Bradycardia   2. Syncope, unspecified syncope type   3. Pure hypercholesterolemia    PLAN:    In order of problems listed above:  1. Patient with history of bradycardia, dizziness, syncope in the context of taking beta-blockers and Aricept.  Symptoms of dizziness and syncope have improved since stopping propanolol and Aricept.  Echo on 06/2020 showed normal ejection fraction.  EKG today shows sinus rhythm, heart rate 74.  She still complains of fluctuant heart rates, occasionally low heart associated with diaphoresis, typically in the 40s.  We will place a cardiac monitor x1 week to evaluate any significant conduction abnormalities or AV block.  She notes episodes of fast heart rates, unsure if she has sick sinus syndrome.  Follow-up after cardiac monitor. 2. Previous syncope is resolved.  Orthostatic vitals in the office today with no evidence for orthostasis.  Heart rates ranged from 65 to 84 bpm. 3. History of hyperlipidemia, continue Crestor.    Follow-up after cardiac monitor.  Total encounter time 60 minutes  Greater than 50% was spent in counseling and  coordination of care with the patient    Medication Adjustments/Labs and Tests Ordered: Current medicines are reviewed at length with the patient today.  Concerns regarding medicines are outlined above.  Orders Placed This Encounter  Procedures  . LONG TERM MONITOR (3-14 DAYS)  . EKG 12-Lead   No orders of the defined types were placed in this encounter.   Patient Instructions  Medication Instructions:  Your physician recommends that you continue on your current medications as directed. Please refer to the Current Medication list given to you today.  *If you need a refill on your cardiac medications before your next appointment, please call your pharmacy*   Lab Work: None Ordered If you have labs (blood work) drawn today and your tests are completely normal, you will receive your results only by: Marland Kitchen MyChart Message (if you have MyChart) OR . A paper copy in the mail If you have any lab test that is abnormal or we need to change your treatment, we will call you to review the results.   Testing/Procedures: Your physician has recommended that you wear a Zio monitor. This monitor is a medical device that records the heart's electrical activity. Doctors most often use these monitors to diagnose arrhythmias. Arrhythmias are problems with the speed or rhythm of the heartbeat. The monitor is a small device applied to your chest. You can wear one while you do your normal daily activities. While wearing this monitor if you have any symptoms to push the button and record what you felt. Once you have worn this monitor for the period of time provider prescribed (Usually 14 days), you will return the monitor device in the postage paid box. Once it is returned they will download the data collected and provide [place] with a report which the provider will then review and we will call you with those results. Important tips:  1. Avoid showering during the first 24 hours of wearing the monitor. 2. Avoid  excessive sweating to help maximize wear time. 3. Do not submerge the device, no hot tubs, and no swimming pools. 4. Keep any lotions or oils away from the patch. 5. After 24 hours you may shower with the patch on. Take brief showers with your back facing the shower  head.  6. Do not remove patch once it has been placed because that will interrupt data and decrease adhesive wear time. 7. Push the button when you have any symptoms and write down what you were feeling. 8. Once you have completed wearing your monitor, remove and place into box which has postage paid and place in your outgoing mailbox.  9. If for some reason you have misplaced your box then call our office and we can provide another box and/or mail it off for you.         Follow-Up: At [facility], you and your health needs are our priority.  As part of our continuing mission to provide you with exceptional heart care, we have created designated Provider Care Teams.  These Care Teams include your primary Cardiologist (physician) and Advanced Practice Providers (APPs -  Physician Assistants and Nurse Practitioners) who all work together to provide you with the care you need, when you need it.  We recommend signing up for the patient portal called "MyChart".  Sign up information is provided on this After Visit Summary.  MyChart is used to connect with patients for Virtual Visits (Telemedicine).  Patients are able to view lab/test results, encounter notes, upcoming appointments, etc.  Non-urgent messages can be sent to your provider as well.   To learn more about what you can do with MyChart, go to [email or web address].    Your next appointment:   4 week(s)  The format for your next appointment:   In Person  Provider:   Kate Sable, MD   Other Instructions      Signed, Kate Sable, MD  08/31/2020 5:08 PM    [place]

## 2020-08-31 NOTE — Patient Instructions (Addendum)
Medication Instructions:  Your physician recommends that you continue on your current medications as directed. Please refer to the Current Medication list given to you today.  *If you need a refill on your cardiac medications before your next appointment, please call your pharmacy*   Lab Work: None Ordered If you have labs (blood work) drawn today and your tests are completely normal, you will receive your results only by: Marland Kitchen MyChart Message (if you have MyChart) OR . A paper copy in the mail If you have any lab test that is abnormal or we need to change your treatment, we will call you to review the results.   Testing/Procedures: Your physician has recommended that you wear a Zio monitor. This monitor is a medical device that records the heart's electrical activity. Doctors most often use these monitors to diagnose arrhythmias. Arrhythmias are problems with the speed or rhythm of the heartbeat. The monitor is a small device applied to your chest. You can wear one while you do your normal daily activities. While wearing this monitor if you have any symptoms to push the button and record what you felt. Once you have worn this monitor for the period of time provider prescribed (Usually 14 days), you will return the monitor device in the postage paid box. Once it is returned they will download the data collected and provide [place] with a report which the provider will then review and we will call you with those results. Important tips:  1. Avoid showering during the first 24 hours of wearing the monitor. 2. Avoid excessive sweating to help maximize wear time. 3. Do not submerge the device, no hot tubs, and no swimming pools. 4. Keep any lotions or oils away from the patch. 5. After 24 hours you may shower with the patch on. Take brief showers with your back facing the shower head.  6. Do not remove patch once it has been placed because that will interrupt data and decrease adhesive wear time. 7. Push  the button when you have any symptoms and write down what you were feeling. 8. Once you have completed wearing your monitor, remove and place into box which has postage paid and place in your outgoing mailbox.  9. If for some reason you have misplaced your box then call our office and we can provide another box and/or mail it off for you.         Follow-Up: At [facility], you and your health needs are our priority.  As part of our continuing mission to provide you with exceptional heart care, we have created designated Provider Care Teams.  These Care Teams include your primary Cardiologist (physician) and Advanced Practice Providers (APPs -  Physician Assistants and Nurse Practitioners) who all work together to provide you with the care you need, when you need it.  We recommend signing up for the patient portal called "MyChart".  Sign up information is provided on this After Visit Summary.  MyChart is used to connect with patients for Virtual Visits (Telemedicine).  Patients are able to view lab/test results, encounter notes, upcoming appointments, etc.  Non-urgent messages can be sent to your provider as well.   To learn more about what you can do with MyChart, go to [email or web address].    Your next appointment:   4 week(s)  The format for your next appointment:   In Person  Provider:   Kate Sable, MD   Other Instructions

## 2020-09-08 ENCOUNTER — Ambulatory Visit: Payer: Self-pay | Admitting: Family Medicine

## 2020-09-14 DIAGNOSIS — R001 Bradycardia, unspecified: Secondary | ICD-10-CM | POA: Diagnosis not present

## 2020-09-18 ENCOUNTER — Other Ambulatory Visit: Payer: Self-pay | Admitting: Family Medicine

## 2020-09-21 ENCOUNTER — Telehealth: Payer: Self-pay

## 2020-09-21 DIAGNOSIS — F331 Major depressive disorder, recurrent, moderate: Secondary | ICD-10-CM | POA: Diagnosis not present

## 2020-09-21 DIAGNOSIS — F5105 Insomnia due to other mental disorder: Secondary | ICD-10-CM | POA: Diagnosis not present

## 2020-09-21 DIAGNOSIS — F411 Generalized anxiety disorder: Secondary | ICD-10-CM | POA: Diagnosis not present

## 2020-09-21 NOTE — Telephone Encounter (Signed)
patient has been notified of the Zio results via Vm per DPR on file. Encouraged patient to call back with any questions or concerns.

## 2020-09-28 ENCOUNTER — Ambulatory Visit: Payer: Medicare Other | Admitting: Cardiology

## 2020-10-05 ENCOUNTER — Encounter: Payer: Self-pay | Admitting: Cardiology

## 2020-10-05 ENCOUNTER — Ambulatory Visit (INDEPENDENT_AMBULATORY_CARE_PROVIDER_SITE_OTHER): Payer: Medicare Other | Admitting: Cardiology

## 2020-10-05 ENCOUNTER — Other Ambulatory Visit: Payer: Self-pay

## 2020-10-05 VITALS — BP 138/70 | HR 67 | Ht 67.0 in | Wt 174.1 lb

## 2020-10-05 DIAGNOSIS — R55 Syncope and collapse: Secondary | ICD-10-CM | POA: Diagnosis not present

## 2020-10-05 DIAGNOSIS — E7800 Pure hypercholesterolemia, unspecified: Secondary | ICD-10-CM

## 2020-10-05 NOTE — Progress Notes (Signed)
Cardiology Office Note:    Date:  10/05/2020   ID:  Nancy Blackburn, Nancy Blackburn 01-Sep-1946, MRN [id number]  PCP:  Jerrol Banana., MD  [facility] Cardiologist:  Kate Sable, MD  [place] Electrophysiologist:  None   Referring MD: Jerrol Banana.,*   Chief Complaint  Patient presents with  . OTHER    4 wk f/u zio c/o sob and feeling clammy and sweaty. Meds reviewed verbally with pt.    History of Present Illness:    Nancy Blackburn is a 74 y.o. female with a hx of hyperlipidemia, Parkinson's disease, mild cognitive impairment, who presents due to bradycardia and syncope.  Patient symptoms have overall improved since stopping beta blocker/propanolol and Aricept.  She did complain of fluctuating heart rates sometimes in the 40s.  Cardiac monitor was placed to evaluate any significant arrhythmias or AV conduction defects.  She states having occasional low heart rates in the 30s, no further episodes of syncope, one episode of dizziness.  Patient significantly improved, as her symptoms of dizziness were previously occurring daily.   Prior notes Patient was evaluated in the hospital from 7/21-7/23 due to dizziness, presyncope and bradycardia.  Prior to admission, she notes episodes of dizziness and passing out.  Saw her primary care on day of admission and was noted to be bradycardic with heart rate 38.  EKG in the hospital on 7/21 showed marked bradycardia heart rate 37.  Patient was also hypotensive with BP 91/58 upon chart review.  She used to be on propanolol and Aricept which were discontinued. Echo on 07/03/2020 showed normal systolic function, EF 60 to 65%, impaired relaxation.  Her heart rates improved with subsequent EKG showing sinus bradycardia heart rate 55, and subsequently stayed consistently in the 70s after discontinuation of propanolol and Aricept.  Had no symptoms with ambulation and was discharged.   Past Medical History:  Diagnosis Date  .  Chronic sinus infection   . Ejection fraction    a. EF 55-65%, echo, mild LVH, June, 2013, small pericardial effusion  . Fracture, sacrum/coccyx ([place])   . Hyperlipidemia    a. takes Simvastatin  . Hypertension    a. takes Metoprolol, Lisinopril, and amlodipine.  . Low back pain   . Mild dementia ([place])   . Nasal polyps   . Normal nuclear stress test    a. 07/2005 Nl nuc stress test.  . Osteopenia   . Osteoporosis   . Palpitations    a. June 2013:  Event recorder normal sinus rhythm with rare PVCs - ? symptomatic PVC's.  . Parkinson disease ([place])   . Pericardial effusion    a. Small, echo, June, 2013  . PONV (postoperative nausea and vomiting)     Past Surgical History:  Procedure Laterality Date  . BACK SURGERY  08/2010  . BLADDER SUSPENSION  2003  . bone spur  2009   removed from right collar bone area and partial collar bone removed  . THYROIDECTOMY, PARTIAL    . TONSILLECTOMY     as child  . TUBAL LIGATION     32yrs ago  . VAGINAL HYSTERECTOMY     34yrs ago d/t cervical cancer    Current Medications: Current Meds  Medication Sig  . busPIRone (BUSPAR) 10 MG tablet Take 10 mg by mouth 2 (two) times daily.   . citalopram (CELEXA) 40 MG tablet 40 mg daily.   . colestipol (COLESTID) 1 g tablet Take 1 g by mouth 2 (two) times  daily. Up to 3 times a day  . primidone (MYSOLINE) 50 MG tablet Take 50 mg by mouth at bedtime.  . rosuvastatin (CRESTOR) 10 MG tablet TAKE 1 TABLET EVERY DAY  . vitamin B-12 (CYANOCOBALAMIN) 1000 MCG tablet Take 1,000 mcg by mouth daily.     Allergies:   Anti-inflammatory enzyme [nutritional supplements], Cymbalta [duloxetine hcl], Clarithromycin, Donepezil hcl, Nsaids, Ranitidine, and Venlafaxine   Social History   Socioeconomic History  . Marital status: Divorced    Spouse name: Not on file  . Number of children: 2  . Years of education: Not on file  . Highest education level: 12th grade  Occupational History  . Occupation: retired    Tobacco Use  . Smoking status: Never Smoker  . Smokeless tobacco: Never Used  Vaping Use  . Vaping Use: Never used  Substance and Sexual Activity  . Alcohol use: Yes    Alcohol/week: 0.0 standard drinks    Comment: drinks 3-4 times a year  . Drug use: No  . Sexual activity: Never  Other Topics Concern  . Not on file  Social History Narrative  . Not on file   Social Determinants of Health   Financial Resource Strain: Low Risk   . Difficulty of Paying Living Expenses: Not hard at all  Food Insecurity: No Food Insecurity  . Worried About Charity fundraiser in the Last Year: Never true  . Ran Out of Food in the Last Year: Never true  Transportation Needs: No Transportation Needs  . Lack of Transportation (Medical): No  . Lack of Transportation (Non-Medical): No  Physical Activity: Insufficiently Active  . Days of Exercise per Week: 2 days  . Minutes of Exercise per Session: 60 min  Stress: No Stress Concern Present  . Feeling of Stress : Not at all  Social Connections: Moderately Integrated  . Frequency of Communication with Friends and Family: More than three times a week  . Frequency of Social Gatherings with Friends and Family: More than three times a week  . Attends Religious Services: More than 4 times per year  . Active Member of Clubs or Organizations: Yes  . Attends Archivist Meetings: More than 4 times per year  . Marital Status: Divorced     Family History: The patient's family history includes Angina in her mother; Atrial fibrillation in her brother; Breast cancer in her cousin; COPD in her father; Cancer in her daughter; Colon cancer in her mother; Cushing syndrome in her daughter; Heart disease in her father and mother; Heart murmur in her mother. There is no history of Hypotension, Malignant hyperthermia, or Pseudochol deficiency.  ROS:   Please see the history of present illness.     All other systems reviewed and are negative.  EKGs/Labs/Other  Studies Reviewed:    The following studies were reviewed today:   EKG:  EKG is  ordered today.  The ekg ordered today demonstrates sinus rhythm, HR 67, baseline artifacts associated with tremors  Recent Labs: 08/27/2020: ALT 12; BUN 13; Creatinine, Ser 0.91; Hemoglobin 14.1; Platelets 252; Potassium 4.0; Sodium 139; TSH 1.330  Recent Lipid Panel    Component Value Date/Time   CHOL 200 (H) 04/12/2019 1116   TRIG 154 (H) 04/12/2019 1116   HDL 59 04/12/2019 1116   CHOLHDL 3.4 04/12/2019 1116   LDLCALC 110 (H) 04/12/2019 1116    Physical Exam:    VS:  BP 138/70 (BP Location: Left Arm, Patient Position: Sitting, Cuff Size: Normal)  Pulse 67   Ht 5\' 7"  (1.702 m)   Wt 174 lb 2 oz (79 kg)   SpO2 98%   BMI 27.27 kg/m     Wt Readings from Last 3 Encounters:  10/05/20 174 lb 2 oz (79 kg)  04/17/20 176 lb 8 oz (80.1 kg)  08/27/20 178 lb (80.7 kg)     GEN:  Well nourished, well developed in no acute distress, occasional tremors HEENT: Normal NECK: No JVD; No carotid bruits LYMPHATICS: No lymphadenopathy CARDIAC: RRR, no murmurs, rubs, gallops RESPIRATORY:  Clear to auscultation without rales, wheezing or rhonchi  ABDOMEN: Soft, non-tender, non-distended MUSCULOSKELETAL:  No edema; No deformity  SKIN: Warm and dry NEUROLOGIC:  Alert and oriented x 3, resting tremors noted PSYCHIATRIC:  Normal affect   ASSESSMENT:    1. Syncope, unspecified syncope type   2. Pure hypercholesterolemia    PLAN:    In order of problems listed above:  1. Patient with history of bradycardia, dizziness, syncope in the context of taking beta-blockers and Aricept.  Symptoms of dizziness and syncope have improved since stopping propanolol and Aricept. 1 week cardiac monitor showed frequent PVCs about 25%, minimum heart rate 55.  No evidence of conduction defects.  Echo 06/2020 with normal systolic function.  We will continue to avoid beta-blockers in this patient despite frequent ventricular ectopies  due to history of dizziness, syncope with beta-blocker use.  Also avoiding antiarrhythmic such as amiodarone for similar reasons. 2. History of hyperlipidemia, continue Crestor.    Follow-up 1 year  Total encounter time 35 minutes  Greater than 50% was spent in counseling and coordination of care with the patient    Medication Adjustments/Labs and Tests Ordered: Current medicines are reviewed at length with the patient today.  Concerns regarding medicines are outlined above.  Orders Placed This Encounter  Procedures  . EKG 12-Lead   No orders of the defined types were placed in this encounter.   Patient Instructions  Medication Instructions:  Your physician recommends that you continue on your current medications as directed. Please refer to the Current Medication list given to you today.  *If you need a refill on your cardiac medications before your next appointment, please call your pharmacy*  Follow-Up: At [facility], you and your health needs are our priority.  As part of our continuing mission to provide you with exceptional heart care, we have created designated Provider Care Teams.  These Care Teams include your primary Cardiologist (physician) and Advanced Practice Providers (APPs -  Physician Assistants and Nurse Practitioners) who all work together to provide you with the care you need, when you need it.  We recommend signing up for the patient portal called "MyChart".  Sign up information is provided on this After Visit Summary.  MyChart is used to connect with patients for Virtual Visits (Telemedicine).  Patients are able to view lab/test results, encounter notes, upcoming appointments, etc.  Non-urgent messages can be sent to your provider as well.   To learn more about what you can do with MyChart, go to [email or web address].    Your next appointment:   12 month(s)  The format for your next appointment:   In Person  Provider:   You may see Kate Sable,  MD or one of the following Advanced Practice Providers on your designated Care Team:    Murray Hodgkins, NP  Christell Faith, PA-C  Marrianne Mood, PA-C  Cadence [place], [place]      Signed, Kate Sable, MD  10/05/2020  5:00 PM    [place]

## 2020-10-05 NOTE — Patient Instructions (Signed)
Medication Instructions:  Your physician recommends that you continue on your current medications as directed. Please refer to the Current Medication list given to you today.  *If you need a refill on your cardiac medications before your next appointment, please call your pharmacy*  Follow-Up: At [facility], you and your health needs are our priority.  As part of our continuing mission to provide you with exceptional heart care, we have created designated Provider Care Teams.  These Care Teams include your primary Cardiologist (physician) and Advanced Practice Providers (APPs -  Physician Assistants and Nurse Practitioners) who all work together to provide you with the care you need, when you need it.  We recommend signing up for the patient portal called "MyChart".  Sign up information is provided on this After Visit Summary.  MyChart is used to connect with patients for Virtual Visits (Telemedicine).  Patients are able to view lab/test results, encounter notes, upcoming appointments, etc.  Non-urgent messages can be sent to your provider as well.   To learn more about what you can do with MyChart, go to [email or web address].    Your next appointment:   12 month(s)  The format for your next appointment:   In Person  Provider:   You may see Kate Sable, MD or one of the following Advanced Practice Providers on your designated Care Team:    Murray Hodgkins, NP  Christell Faith, PA-C  Marrianne Mood, PA-C  Cadence [place], [place]

## 2020-10-07 DIAGNOSIS — R197 Diarrhea, unspecified: Secondary | ICD-10-CM | POA: Diagnosis not present

## 2020-10-07 DIAGNOSIS — Z800 Family history of malignant neoplasm of digestive organs: Secondary | ICD-10-CM | POA: Diagnosis not present

## 2020-10-14 ENCOUNTER — Other Ambulatory Visit: Payer: Self-pay

## 2020-10-14 ENCOUNTER — Ambulatory Visit (INDEPENDENT_AMBULATORY_CARE_PROVIDER_SITE_OTHER): Payer: Medicare Other

## 2020-10-14 DIAGNOSIS — Z23 Encounter for immunization: Secondary | ICD-10-CM

## 2020-11-11 ENCOUNTER — Other Ambulatory Visit: Payer: Self-pay

## 2020-11-11 ENCOUNTER — Ambulatory Visit (INDEPENDENT_AMBULATORY_CARE_PROVIDER_SITE_OTHER): Payer: Medicare Other

## 2020-11-11 ENCOUNTER — Telehealth: Payer: Self-pay

## 2020-11-11 DIAGNOSIS — Z23 Encounter for immunization: Secondary | ICD-10-CM | POA: Diagnosis not present

## 2020-11-11 NOTE — Telephone Encounter (Signed)
Patient wants to know when she is suppose to come back and see Dr. Rosanna Randy.

## 2020-11-11 NOTE — Telephone Encounter (Signed)
Please advise follow up? Patient seen 08/27/20.

## 2020-11-12 NOTE — Telephone Encounter (Signed)
How about March.

## 2020-11-13 NOTE — Telephone Encounter (Signed)
Patient scheduled.

## 2020-12-02 ENCOUNTER — Other Ambulatory Visit: Payer: Self-pay

## 2020-12-02 ENCOUNTER — Encounter: Payer: Self-pay | Admitting: Family Medicine

## 2020-12-02 ENCOUNTER — Telehealth (INDEPENDENT_AMBULATORY_CARE_PROVIDER_SITE_OTHER): Payer: Medicare Other | Admitting: Family Medicine

## 2020-12-02 DIAGNOSIS — R509 Fever, unspecified: Secondary | ICD-10-CM | POA: Diagnosis not present

## 2020-12-02 DIAGNOSIS — J329 Chronic sinusitis, unspecified: Secondary | ICD-10-CM

## 2020-12-02 DIAGNOSIS — R059 Cough, unspecified: Secondary | ICD-10-CM | POA: Diagnosis not present

## 2020-12-02 MED ORDER — AZITHROMYCIN 250 MG PO TABS: ORAL_TABLET | ORAL | 0 refills | Status: DC

## 2020-12-02 NOTE — Progress Notes (Signed)
    Virtual telephone visit    Virtual Visit via Telephone Note   This visit type was conducted due to national recommendations for restrictions regarding the COVID-19 Pandemic (e.g. social distancing) in an effort to limit this patient's exposure and mitigate transmission in our community. Due to her co-morbid illnesses, this patient is at least at moderate risk for complications without adequate follow up. This format is felt to be most appropriate for this patient at this time. The patient did not have access to video technology or had technical difficulties with video requiring transitioning to audio format only (telephone). Physical exam was limited to content and character of the telephone converstion.    Patient location: Home Provider location: Office  I discussed the limitations of evaluation and management by telemedicine and the availability of in person appointments. The patient expressed understanding and agreed to proceed.   Visit Date: 12/02/2020  Today's healthcare provider: Wilhemena Durie, MD   No chief complaint on file.  Subjective    HPI  Phone call done today as patient has low-grade fever and a cough that is occasionally productive of greenish sputum.  She has had her Covid vaccines.  She has no shortness of breath.  No hemoptysis.  Some myalgias. No known Covid exposure.     Medications: Outpatient Medications Prior to Visit  Medication Sig  . busPIRone (BUSPAR) 10 MG tablet Take 10 mg by mouth 2 (two) times daily.   . citalopram (CELEXA) 40 MG tablet 40 mg daily.   . colestipol (COLESTID) 1 g tablet Take 1 g by mouth 2 (two) times daily. Up to 3 times a day  . primidone (MYSOLINE) 50 MG tablet Take 50 mg by mouth at bedtime.  . rosuvastatin (CRESTOR) 10 MG tablet TAKE 1 TABLET EVERY DAY  . vitamin B-12 (CYANOCOBALAMIN) 1000 MCG tablet Take 1,000 mcg by mouth daily.   No facility-administered medications prior to visit.    Review of Systems      Objective    There were no vitals taken for this visit.   She is not having difficulty breathing on the phone call today.  She is alert and answering questions appropriately.  No obvious wheezing.   Assessment & Plan     1. Cough Check Covid test - Novel Coronavirus, NAA ([facility])  2. Fever, unspecified fever cause  - Novel Coronavirus, NAA ([facility])  3. Sinusitis, unspecified chronicity, unspecified location Treat with Z-Pak which patient has tolerated before.  Other option going forward is doxycycline or Augmentin.   No follow-ups on file.    I discussed the assessment and treatment plan with the patient. The patient was provided an opportunity to ask questions and all were answered. The patient agreed with the plan and demonstrated an understanding of the instructions.   The patient was advised to call back or seek an in-person evaluation if the symptoms worsen or if the condition fails to improve as anticipated.  I provided 10 minutes of non-face-to-face time during this encounter.    Onetta Spainhower Cranford Mon, MD [facility] [phone] (phone) [phone] (fax)  [place]

## 2020-12-03 LAB — SARS-COV-2, NAA 2 DAY TAT

## 2020-12-03 LAB — NOVEL CORONAVIRUS, NAA: SARS-CoV-2, NAA: NOT DETECTED

## 2020-12-07 ENCOUNTER — Other Ambulatory Visit: Payer: Self-pay

## 2020-12-07 DIAGNOSIS — B349 Viral infection, unspecified: Secondary | ICD-10-CM

## 2020-12-09 LAB — SARS-COV-2, NAA 2 DAY TAT

## 2020-12-09 LAB — NOVEL CORONAVIRUS, NAA: SARS-CoV-2, NAA: NOT DETECTED

## 2021-01-12 DIAGNOSIS — F5105 Insomnia due to other mental disorder: Secondary | ICD-10-CM | POA: Diagnosis not present

## 2021-01-12 DIAGNOSIS — F411 Generalized anxiety disorder: Secondary | ICD-10-CM | POA: Diagnosis not present

## 2021-01-12 DIAGNOSIS — F331 Major depressive disorder, recurrent, moderate: Secondary | ICD-10-CM | POA: Diagnosis not present

## 2021-01-22 DIAGNOSIS — M170 Bilateral primary osteoarthritis of knee: Secondary | ICD-10-CM | POA: Diagnosis not present

## 2021-01-26 DIAGNOSIS — R2681 Unsteadiness on feet: Secondary | ICD-10-CM | POA: Diagnosis not present

## 2021-01-26 DIAGNOSIS — G250 Essential tremor: Secondary | ICD-10-CM | POA: Diagnosis not present

## 2021-02-08 ENCOUNTER — Other Ambulatory Visit: Payer: Self-pay | Admitting: Family Medicine

## 2021-02-08 DIAGNOSIS — Z1231 Encounter for screening mammogram for malignant neoplasm of breast: Secondary | ICD-10-CM

## 2021-02-16 ENCOUNTER — Ambulatory Visit (INDEPENDENT_AMBULATORY_CARE_PROVIDER_SITE_OTHER): Payer: Medicare Other | Admitting: Family Medicine

## 2021-02-16 ENCOUNTER — Other Ambulatory Visit: Payer: Self-pay

## 2021-02-16 ENCOUNTER — Ambulatory Visit: Payer: Self-pay | Admitting: Family Medicine

## 2021-02-16 ENCOUNTER — Encounter: Payer: Self-pay | Admitting: Family Medicine

## 2021-02-16 VITALS — BP 153/91 | HR 69 | Resp 16 | Wt 182.0 lb

## 2021-02-16 DIAGNOSIS — Z01818 Encounter for other preprocedural examination: Secondary | ICD-10-CM

## 2021-02-16 DIAGNOSIS — I10 Essential (primary) hypertension: Secondary | ICD-10-CM | POA: Diagnosis not present

## 2021-02-16 DIAGNOSIS — R001 Bradycardia, unspecified: Secondary | ICD-10-CM | POA: Diagnosis not present

## 2021-02-16 DIAGNOSIS — E538 Deficiency of other specified B group vitamins: Secondary | ICD-10-CM | POA: Diagnosis not present

## 2021-02-16 DIAGNOSIS — G20 Parkinson's disease: Secondary | ICD-10-CM

## 2021-02-16 MED ORDER — AMLODIPINE BESYLATE 5 MG PO TABS: 5.0000 mg | ORAL_TABLET | Freq: Every day | ORAL | 3 refills | Status: DC

## 2021-02-16 NOTE — Progress Notes (Signed)
Established patient visit   [name]   DOB: June 29, 1946   75 y.o. Female  MRN: [id number] Visit Date: 02/16/2021  Today's healthcare provider: Wilhemena Durie, MD   Chief Complaint  Patient presents with  . Pre-op Exam   Subjective    HPI  Patient presents today for a pre-op visit. She is due to have a total knee replacement on 03/09/2021.  He feels well and has no chest pain or neurologic symptoms.  No dyspnea on exertion.  Pressures at home running up to 180/90 at times.      Medications: Outpatient Medications Prior to Visit  Medication Sig  . busPIRone (BUSPAR) 10 MG tablet Take 10 mg by mouth 2 (two) times daily.   . Calcium Carbonate-Vitamin D3 600-400 MG-UNIT TABS Take 1 tablet by mouth daily.  . citalopram (CELEXA) 40 MG tablet Take 40 mg by mouth daily.  . primidone (MYSOLINE) 50 MG tablet Take 50 mg by mouth at bedtime.  . rosuvastatin (CRESTOR) 10 MG tablet TAKE 1 TABLET EVERY DAY (Patient taking differently: Take 10 mg by mouth daily.)  . traZODone (DESYREL) 100 MG tablet Take 100 mg by mouth at bedtime.  . vitamin B-12 (CYANOCOBALAMIN) 1000 MCG tablet Take 1,000 mcg by mouth daily.   No facility-administered medications prior to visit.    Review of Systems      Objective    BP (!) 153/91   Pulse 69   Resp 16   Wt 182 lb (82.6 kg)   BMI 28.51 kg/m  BP Readings from Last 3 Encounters:  02/16/21 (!) 153/91  10/05/20 138/70  08/31/20 (!) 143/74   Wt Readings from Last 3 Encounters:  02/16/21 182 lb (82.6 kg)  10/05/20 174 lb 2 oz (79 kg)  04/17/20 176 lb 8 oz (80.1 kg)       Physical Exam Vitals reviewed.  Constitutional:      Appearance: Normal appearance. She is well-developed.     Comments: Patient appears well.  This is the best she has looked in several years.  HENT:     Head: Normocephalic and atraumatic.     Right Ear: External ear normal.     Left Ear: External ear normal.     Nose: Nose normal.  Eyes:      General: No scleral icterus.    Conjunctiva/sclera: Conjunctivae normal.  Neck:     Thyroid: No thyromegaly.  Cardiovascular:     Rate and Rhythm: Normal rate and regular rhythm.     Heart sounds: Normal heart sounds.  Pulmonary:     Effort: Pulmonary effort is normal.     Breath sounds: Normal breath sounds.  Chest:  Breasts: Breasts are symmetrical.     Right: Normal.     Left: Normal.    Abdominal:     Palpations: Abdomen is soft.  Skin:    General: Skin is warm and dry.  Neurological:     Mental Status: She is alert and oriented to person, place, and time. Mental status is at baseline.  Psychiatric:        Mood and Affect: Mood normal.        Behavior: Behavior normal.        Thought Content: Thought content normal.        Judgment: Judgment normal.     View reveals normal sinus rhythm with anterior fascicular block and nonspecific ST-T wave changes.  No results found for any visits on 02/16/21.  Assessment & Plan     1. Pre-op examination Cleared for orthopedic surgery. - EKG 12-Lead  2. Primary hypertension Amlodipine 5 mg daily.  Follow-up 2 to 3 months.  3. Parkinson's disease ([place]) Clinically stable  4. Bradycardia Resolved off of beta-blocker  5. B12 deficiency    No follow-ups on file.      I, Wilhemena Durie, MD, have reviewed all documentation for this visit. The documentation on 02/18/21 for the exam, diagnosis, procedures, and orders are all accurate and complete.     Cranford Mon, MD  [facility] [phone] (phone) [phone] (fax)  [place]

## 2021-02-25 ENCOUNTER — Other Ambulatory Visit: Payer: Self-pay | Admitting: Orthopedic Surgery

## 2021-02-25 MED ORDER — CLINDAMYCIN PHOSPHATE 600 MG/50ML IV SOLN
600.0000 mg | Freq: Once | INTRAVENOUS | Status: AC
  Administered 2021-03-09: 600 mg via INTRAVENOUS
  Filled 2021-02-25: qty 50

## 2021-02-25 MED ORDER — TRANEXAMIC ACID-NACL 1000-0.7 MG/100ML-% IV SOLN
1000.0000 mg | INTRAVENOUS | Status: AC
  Filled 2021-02-25: qty 100

## 2021-02-26 ENCOUNTER — Other Ambulatory Visit: Payer: Self-pay

## 2021-02-26 ENCOUNTER — Encounter
Admission: RE | Admit: 2021-02-26 | Discharge: 2021-02-26 | Disposition: A | Discharge status: Home / Self Care | Payer: Medicare Other | Source: Ambulatory Visit | Attending: Orthopedic Surgery | Admitting: Orthopedic Surgery

## 2021-02-26 DIAGNOSIS — Z01818 Encounter for other preprocedural examination: Secondary | ICD-10-CM | POA: Diagnosis not present

## 2021-02-26 HISTORY — DX: Unspecified asthma, uncomplicated: J45.909

## 2021-02-26 HISTORY — DX: Bradycardia, unspecified: R00.1

## 2021-02-26 HISTORY — DX: Personal history of urinary calculi: Z87.442

## 2021-02-26 LAB — CBC WITH DIFFERENTIAL/PLATELET
Abs Immature Granulocytes: 0.01 10*3/uL (ref 0.00–0.07)
Basophils Absolute: 0.1 10*3/uL (ref 0.0–0.1)
Basophils Relative: 1 %
Eosinophils Absolute: 0.4 10*3/uL (ref 0.0–0.5)
Eosinophils Relative: 6 %
HCT: 45.2 % (ref 36.0–46.0)
Hemoglobin: 15.3 g/dL — ABNORMAL HIGH (ref 12.0–15.0)
Immature Granulocytes: 0 %
Lymphocytes Relative: 30 %
Lymphs Abs: 2 10*3/uL (ref 0.7–4.0)
MCH: 30.2 pg (ref 26.0–34.0)
MCHC: 33.8 g/dL (ref 30.0–36.0)
MCV: 89.2 fL (ref 80.0–100.0)
Monocytes Absolute: 0.9 10*3/uL (ref 0.1–1.0)
Monocytes Relative: 13 %
Neutro Abs: 3.3 10*3/uL (ref 1.7–7.7)
Neutrophils Relative %: 50 %
Platelets: 187 10*3/uL (ref 150–400)
RBC: 5.07 MIL/uL (ref 3.87–5.11)
RDW: 12.3 % (ref 11.5–15.5)
WBC: 6.7 10*3/uL (ref 4.0–10.5)
nRBC: 0 % (ref 0.0–0.2)

## 2021-02-26 LAB — APTT: aPTT: 28 seconds (ref 24–36)

## 2021-02-26 LAB — URINALYSIS, COMPLETE (UACMP) WITH MICROSCOPIC
Bilirubin Urine: NEGATIVE
Glucose, UA: NEGATIVE mg/dL
Hgb urine dipstick: NEGATIVE
Ketones, ur: NEGATIVE mg/dL
Leukocytes,Ua: NEGATIVE
Nitrite: NEGATIVE
Protein, ur: NEGATIVE mg/dL
Specific Gravity, Urine: 1.017 (ref 1.005–1.030)
pH: 5 (ref 5.0–8.0)

## 2021-02-26 LAB — BASIC METABOLIC PANEL
Anion gap: 5 (ref 5–15)
BUN: 18 mg/dL (ref 8–23)
CO2: 27 mmol/L (ref 22–32)
Calcium: 9.1 mg/dL (ref 8.9–10.3)
Chloride: 106 mmol/L (ref 98–111)
Creatinine, Ser: 0.84 mg/dL (ref 0.44–1.00)
GFR, Estimated: >60 mL/min (ref >60)
Glucose, Bld: 101 mg/dL — ABNORMAL HIGH (ref 70–99)
Potassium: 3.8 mmol/L (ref 3.5–5.1)
Sodium: 138 mmol/L (ref 135–145)

## 2021-02-26 LAB — PROTIME-INR
INR: 1 (ref 0.8–1.2)
Prothrombin Time: 12.8 seconds (ref 11.4–15.2)

## 2021-02-26 LAB — SURGICAL PCR SCREEN
MRSA, PCR: NEGATIVE
Staphylococcus aureus: NEGATIVE

## 2021-02-26 LAB — TYPE AND SCREEN
ABO/RH(D): O NEG
Antibody Screen: NEGATIVE

## 2021-02-26 LAB — HEMOGLOBIN A1C
Hgb A1c MFr Bld: 5.4 % (ref 4.8–5.6)
Mean Plasma Glucose: 108.28 mg/dL

## 2021-02-26 NOTE — Patient Instructions (Addendum)
Your procedure is scheduled on: Tuesday, March 29 Report to the Registration Desk on the 1st floor of the [facility]. To find out your arrival time, please call [phone] between 1PM - 3PM on: Monday, March 28  REMEMBER: Instructions that are not followed completely may result in serious medical risk, up to and including death; or upon the discretion of your surgeon and anesthesiologist your surgery may need to be rescheduled.  Do not eat food after midnight the night before surgery.  No gum chewing, lozengers or hard candies.  You may however, drink CLEAR liquids up to 2 hours before you are scheduled to arrive for your surgery. Do not drink anything within 2 hours of your scheduled arrival time.  Clear liquids include: - water  - apple juice without pulp - gatorade (not RED, PURPLE, OR BLUE) - black coffee or tea (Do NOT add milk or creamers to the coffee or tea) Do NOT drink anything that is not on this list.  TAKE THESE MEDICATIONS THE MORNING OF SURGERY WITH A SIP OF WATER:  1.  Amlodipine 2.  Citalopram 3.  Buspirone  One week prior to surgery: starting March 22 Stop Anti-inflammatories (NSAIDS) such as Advil, Aleve, Ibuprofen, Motrin, Naproxen, Naprosyn and Aspirin based products such as Excedrin, Goodys Powder, BC Powder. Stop ANY OVER THE COUNTER supplements until after surgery.  No Alcohol for 24 hours before or after surgery.  On the morning of surgery brush your teeth with toothpaste and water, you may rinse your mouth with mouthwash if you wish. Do not swallow any toothpaste or mouthwash.  Do not wear jewelry, make-up, hairpins, clips or nail polish.  Do not wear lotions, powders, or perfumes.   Do not shave body from the neck down 48 hours prior to surgery just in case you cut yourself which could leave a site for infection.  Also, freshly shaved skin may become irritated if using the CHG soap.  Contact lenses, hearing aids and dentures may not be worn  into surgery.  Do not bring valuables to the hospital. [facility] is not responsible for any missing/lost belongings or valuables.   Use CHG Soap as directed on instruction sheet.  Notify your doctor if there is any change in your medical condition (cold, fever, infection).  Wear comfortable clothing (specific to your surgery type) to the hospital.  Plan for stool softeners for home use; pain medications have a tendency to cause constipation. You can also help prevent constipation by eating foods high in fiber such as fruits and vegetables and drinking plenty of fluids as your diet allows.  After surgery, you can help prevent lung complications by doing breathing exercises.  Take deep breaths and cough every 1-2 hours. Your doctor may order a device called an Incentive Spirometer to help you take deep breaths.  If you are being admitted to the hospital overnight, leave your suitcase in the car. After surgery it may be brought to your room.  If you are being discharged the day of surgery, you will not be allowed to drive home. You will need a responsible adult (18 years or older) to drive you home and stay with you that night.   If you are taking public transportation, you will need to have a responsible adult (18 years or older) with you. Please confirm with your physician that it is acceptable to use public transportation.   Please call the [place] Dept. at [phone] if you have any questions about these  instructions.  Surgery Visitation Policy:  Patients undergoing a surgery or procedure may have one family member or support person with them as long as that person is not COVID-19 positive or experiencing its symptoms.  That person may remain in the waiting area during the procedure.  Inpatient Visitation:    Visiting hours are 7 a.m. to 8 p.m. Inpatients will be allowed two visitors daily. The visitors may change each day during the patient's stay. No  visitors under the age of 50. Any visitor under the age of 58 must be accompanied by an adult. The visitor must pass COVID-19 screenings, use hand sanitizer when entering and exiting the patient's room and wear a mask at all times, including in the patient's room. Patients must also wear a mask when staff or their visitor are in the room. Masking is required regardless of vaccination status.

## 2021-03-01 ENCOUNTER — Ambulatory Visit
Admission: RE | Admit: 2021-03-01 | Discharge: 2021-03-01 | Disposition: A | Discharge status: Home / Self Care | Payer: Medicare Other | Source: Ambulatory Visit | Attending: Family Medicine | Admitting: Family Medicine

## 2021-03-01 ENCOUNTER — Other Ambulatory Visit: Payer: Self-pay

## 2021-03-01 DIAGNOSIS — Z1231 Encounter for screening mammogram for malignant neoplasm of breast: Secondary | ICD-10-CM

## 2021-03-05 ENCOUNTER — Other Ambulatory Visit: Payer: Self-pay

## 2021-03-05 ENCOUNTER — Other Ambulatory Visit
Admission: RE | Admit: 2021-03-05 | Discharge: 2021-03-05 | Disposition: A | Discharge status: Home / Self Care | Payer: Medicare Other | Source: Ambulatory Visit | Attending: Orthopedic Surgery | Admitting: Orthopedic Surgery

## 2021-03-05 DIAGNOSIS — Z01812 Encounter for preprocedural laboratory examination: Secondary | ICD-10-CM | POA: Diagnosis not present

## 2021-03-05 DIAGNOSIS — Z20822 Contact with and (suspected) exposure to covid-19: Secondary | ICD-10-CM | POA: Diagnosis not present

## 2021-03-06 LAB — SARS CORONAVIRUS 2 (TAT 6-24 HRS): SARS Coronavirus 2: NEGATIVE

## 2021-03-08 MED ORDER — CHLORHEXIDINE GLUCONATE 0.12 % MT SOLN: 15.0000 mL | Freq: Once | OROMUCOSAL | Status: AC

## 2021-03-08 MED ORDER — CEFAZOLIN SODIUM-DEXTROSE 2-4 GM/100ML-% IV SOLN
2.0000 g | INTRAVENOUS | Status: AC
  Administered 2021-03-09: 2 g via INTRAVENOUS

## 2021-03-08 MED ORDER — CHLORHEXIDINE GLUCONATE CLOTH 2 % EX PADS: 6.0000 | MEDICATED_PAD | Freq: Once | CUTANEOUS | Status: DC

## 2021-03-08 MED ORDER — ACETAMINOPHEN 500 MG PO TABS: 1000.0000 mg | ORAL_TABLET | ORAL | Status: AC

## 2021-03-08 MED ORDER — FAMOTIDINE 20 MG PO TABS
20.0000 mg | ORAL_TABLET | Freq: Once | ORAL | Status: AC
  Administered 2021-03-09: 20 mg via ORAL

## 2021-03-08 MED ORDER — LACTATED RINGERS IV SOLN: INTRAVENOUS | Status: DC

## 2021-03-08 MED ORDER — ORAL CARE MOUTH RINSE: 15.0000 mL | Freq: Once | OROMUCOSAL | Status: AC

## 2021-03-09 ENCOUNTER — Ambulatory Visit: Payer: Medicare Other | Admitting: Anesthesiology

## 2021-03-09 ENCOUNTER — Other Ambulatory Visit: Payer: Self-pay

## 2021-03-09 ENCOUNTER — Encounter: Payer: Self-pay | Admitting: Orthopedic Surgery

## 2021-03-09 ENCOUNTER — Inpatient Hospital Stay: Payer: Medicare Other

## 2021-03-09 ENCOUNTER — Inpatient Hospital Stay
Admission: RE | Admit: 2021-03-09 | Discharge: 2021-03-12 | DRG: 470 | Disposition: A | Discharge status: Skilled Nursing Facility | Payer: Medicare Other | Attending: Orthopedic Surgery | Admitting: Orthopedic Surgery

## 2021-03-09 ENCOUNTER — Encounter
Admission: RE | Disposition: A | Discharge status: Skilled Nursing Facility | Payer: Self-pay | Source: Home / Self Care | Attending: Orthopedic Surgery

## 2021-03-09 DIAGNOSIS — Z825 Family history of asthma and other chronic lower respiratory diseases: Secondary | ICD-10-CM | POA: Diagnosis not present

## 2021-03-09 DIAGNOSIS — M810 Age-related osteoporosis without current pathological fracture: Secondary | ICD-10-CM | POA: Diagnosis present

## 2021-03-09 DIAGNOSIS — Z96651 Presence of right artificial knee joint: Secondary | ICD-10-CM

## 2021-03-09 DIAGNOSIS — M1711 Unilateral primary osteoarthritis, right knee: Secondary | ICD-10-CM | POA: Diagnosis present

## 2021-03-09 DIAGNOSIS — J45909 Unspecified asthma, uncomplicated: Secondary | ICD-10-CM | POA: Diagnosis present

## 2021-03-09 DIAGNOSIS — Z8249 Family history of ischemic heart disease and other diseases of the circulatory system: Secondary | ICD-10-CM

## 2021-03-09 DIAGNOSIS — G20 Parkinson's disease: Secondary | ICD-10-CM | POA: Diagnosis present

## 2021-03-09 DIAGNOSIS — Z886 Allergy status to analgesic agent status: Secondary | ICD-10-CM

## 2021-03-09 DIAGNOSIS — I10 Essential (primary) hypertension: Secondary | ICD-10-CM | POA: Diagnosis present

## 2021-03-09 DIAGNOSIS — Z981 Arthrodesis status: Secondary | ICD-10-CM | POA: Diagnosis not present

## 2021-03-09 DIAGNOSIS — Z961 Presence of intraocular lens: Secondary | ICD-10-CM | POA: Diagnosis present

## 2021-03-09 DIAGNOSIS — F0280 Dementia in other diseases classified elsewhere without behavioral disturbance: Secondary | ICD-10-CM | POA: Diagnosis present

## 2021-03-09 DIAGNOSIS — M6281 Muscle weakness (generalized): Secondary | ICD-10-CM | POA: Diagnosis not present

## 2021-03-09 DIAGNOSIS — Z888 Allergy status to other drugs, medicaments and biological substances status: Secondary | ICD-10-CM | POA: Diagnosis not present

## 2021-03-09 DIAGNOSIS — Z20822 Contact with and (suspected) exposure to covid-19: Secondary | ICD-10-CM | POA: Diagnosis present

## 2021-03-09 DIAGNOSIS — Z881 Allergy status to other antibiotic agents status: Secondary | ICD-10-CM

## 2021-03-09 DIAGNOSIS — Z9842 Cataract extraction status, left eye: Secondary | ICD-10-CM | POA: Diagnosis not present

## 2021-03-09 DIAGNOSIS — F419 Anxiety disorder, unspecified: Secondary | ICD-10-CM | POA: Diagnosis not present

## 2021-03-09 DIAGNOSIS — R5381 Other malaise: Secondary | ICD-10-CM | POA: Diagnosis not present

## 2021-03-09 DIAGNOSIS — Z79899 Other long term (current) drug therapy: Secondary | ICD-10-CM

## 2021-03-09 DIAGNOSIS — Z87442 Personal history of urinary calculi: Secondary | ICD-10-CM | POA: Diagnosis not present

## 2021-03-09 DIAGNOSIS — D519 Vitamin B12 deficiency anemia, unspecified: Secondary | ICD-10-CM | POA: Diagnosis not present

## 2021-03-09 DIAGNOSIS — E559 Vitamin D deficiency, unspecified: Secondary | ICD-10-CM | POA: Diagnosis not present

## 2021-03-09 DIAGNOSIS — R2681 Unsteadiness on feet: Secondary | ICD-10-CM | POA: Diagnosis not present

## 2021-03-09 DIAGNOSIS — Z471 Aftercare following joint replacement surgery: Secondary | ICD-10-CM | POA: Diagnosis not present

## 2021-03-09 DIAGNOSIS — F411 Generalized anxiety disorder: Secondary | ICD-10-CM | POA: Diagnosis not present

## 2021-03-09 DIAGNOSIS — Z8541 Personal history of malignant neoplasm of cervix uteri: Secondary | ICD-10-CM

## 2021-03-09 DIAGNOSIS — F32A Depression, unspecified: Secondary | ICD-10-CM | POA: Diagnosis not present

## 2021-03-09 DIAGNOSIS — E785 Hyperlipidemia, unspecified: Secondary | ICD-10-CM | POA: Diagnosis present

## 2021-03-09 DIAGNOSIS — M150 Primary generalized (osteo)arthritis: Secondary | ICD-10-CM | POA: Diagnosis not present

## 2021-03-09 DIAGNOSIS — R52 Pain, unspecified: Secondary | ICD-10-CM | POA: Diagnosis not present

## 2021-03-09 DIAGNOSIS — Z9841 Cataract extraction status, right eye: Secondary | ICD-10-CM | POA: Diagnosis not present

## 2021-03-09 DIAGNOSIS — R279 Unspecified lack of coordination: Secondary | ICD-10-CM | POA: Diagnosis not present

## 2021-03-09 DIAGNOSIS — Z90710 Acquired absence of both cervix and uterus: Secondary | ICD-10-CM

## 2021-03-09 DIAGNOSIS — E539 Vitamin B deficiency, unspecified: Secondary | ICD-10-CM | POA: Diagnosis not present

## 2021-03-09 DIAGNOSIS — M25561 Pain in right knee: Secondary | ICD-10-CM | POA: Diagnosis not present

## 2021-03-09 HISTORY — PX: TOTAL KNEE ARTHROPLASTY: SHX125

## 2021-03-09 SURGERY — ARTHROPLASTY, KNEE, TOTAL
Anesthesia: Spinal | Site: Knee | Laterality: Right

## 2021-03-09 MED ORDER — SODIUM CHLORIDE (PF) 0.9 % IJ SOLN
INTRAMUSCULAR | Status: AC
  Filled 2021-03-09: qty 50

## 2021-03-09 MED ORDER — LORATADINE 10 MG PO TABS
10.0000 mg | ORAL_TABLET | Freq: Every day | ORAL | Status: DC
  Administered 2021-03-09 – 2021-03-12 (×4): 10 mg via ORAL
  Filled 2021-03-09 (×4): qty 1

## 2021-03-09 MED ORDER — ROSUVASTATIN CALCIUM 10 MG PO TABS
10.0000 mg | ORAL_TABLET | Freq: Every day | ORAL | Status: DC
  Administered 2021-03-09 – 2021-03-12 (×4): 10 mg via ORAL
  Filled 2021-03-09 (×4): qty 1

## 2021-03-09 MED ORDER — OXYCODONE HCL 5 MG PO TABS: 5.0000 mg | ORAL_TABLET | Freq: Once | ORAL | Status: DC | PRN

## 2021-03-09 MED ORDER — METHOCARBAMOL 1000 MG/10ML IJ SOLN
500.0000 mg | Freq: Four times a day (QID) | INTRAVENOUS | Status: DC | PRN
  Filled 2021-03-09: qty 5

## 2021-03-09 MED ORDER — TRANEXAMIC ACID-NACL 1000-0.7 MG/100ML-% IV SOLN
INTRAVENOUS | Status: DC | PRN
  Administered 2021-03-09: 1000 mg via INTRAVENOUS

## 2021-03-09 MED ORDER — PROPOFOL 10 MG/ML IV BOLUS
INTRAVENOUS | Status: DC | PRN
  Administered 2021-03-09: 40 mg via INTRAVENOUS

## 2021-03-09 MED ORDER — OXYCODONE HCL 5 MG/5ML PO SOLN: 5.0000 mg | Freq: Once | ORAL | Status: DC | PRN

## 2021-03-09 MED ORDER — BUPIVACAINE HCL (PF) 0.5 % IJ SOLN
INTRAMUSCULAR | Status: DC | PRN
  Administered 2021-03-09: 3 mL via INTRATHECAL

## 2021-03-09 MED ORDER — MIDAZOLAM HCL 2 MG/2ML IJ SOLN
INTRAMUSCULAR | Status: AC
  Filled 2021-03-09: qty 2

## 2021-03-09 MED ORDER — SODIUM CHLORIDE 0.9 % IV SOLN
INTRAVENOUS | Status: DC | PRN
  Administered 2021-03-09: 60 mL

## 2021-03-09 MED ORDER — CHLORHEXIDINE GLUCONATE 0.12 % MT SOLN
OROMUCOSAL | Status: AC
  Administered 2021-03-09: 15 mL via OROMUCOSAL
  Filled 2021-03-09: qty 15

## 2021-03-09 MED ORDER — CLINDAMYCIN PHOSPHATE 600 MG/50ML IV SOLN
INTRAVENOUS | Status: AC
  Filled 2021-03-09: qty 50

## 2021-03-09 MED ORDER — MORPHINE SULFATE (PF) 4 MG/ML IV SOLN
INTRAVENOUS | Status: AC
  Filled 2021-03-09: qty 1

## 2021-03-09 MED ORDER — FENTANYL CITRATE (PF) 100 MCG/2ML IJ SOLN
INTRAMUSCULAR | Status: AC
  Filled 2021-03-09: qty 2

## 2021-03-09 MED ORDER — FENTANYL CITRATE (PF) 100 MCG/2ML IJ SOLN
25.0000 ug | INTRAMUSCULAR | Status: DC | PRN
  Administered 2021-03-09 (×2): 25 ug via INTRAVENOUS

## 2021-03-09 MED ORDER — BISACODYL 10 MG RE SUPP
10.0000 mg | Freq: Every day | RECTAL | Status: DC | PRN
  Administered 2021-03-11: 10 mg via RECTAL
  Filled 2021-03-09: qty 1

## 2021-03-09 MED ORDER — BUPIVACAINE-EPINEPHRINE (PF) 0.25% -1:200000 IJ SOLN
INTRAMUSCULAR | Status: AC
  Filled 2021-03-09: qty 120

## 2021-03-09 MED ORDER — SODIUM CHLORIDE 0.9 % IV SOLN: INTRAVENOUS | Status: DC

## 2021-03-09 MED ORDER — PROPOFOL 500 MG/50ML IV EMUL
INTRAVENOUS | Status: DC | PRN
  Administered 2021-03-09: 75 ug/kg/min via INTRAVENOUS

## 2021-03-09 MED ORDER — METHOCARBAMOL 500 MG PO TABS
500.0000 mg | ORAL_TABLET | Freq: Four times a day (QID) | ORAL | Status: DC | PRN
  Administered 2021-03-09 – 2021-03-12 (×4): 500 mg via ORAL
  Filled 2021-03-09 (×4): qty 1

## 2021-03-09 MED ORDER — PRIMIDONE 50 MG PO TABS
50.0000 mg | ORAL_TABLET | Freq: Every day | ORAL | Status: DC
  Administered 2021-03-09 – 2021-03-11 (×3): 50 mg via ORAL
  Filled 2021-03-09 (×4): qty 1

## 2021-03-09 MED ORDER — OXYCODONE HCL 5 MG PO TABS
10.0000 mg | ORAL_TABLET | ORAL | Status: DC | PRN
  Administered 2021-03-09 – 2021-03-12 (×7): 15 mg via ORAL
  Filled 2021-03-09 (×7): qty 3

## 2021-03-09 MED ORDER — AMLODIPINE BESYLATE 5 MG PO TABS
5.0000 mg | ORAL_TABLET | Freq: Every day | ORAL | Status: DC
  Administered 2021-03-10 – 2021-03-12 (×3): 5 mg via ORAL
  Filled 2021-03-09 (×3): qty 1

## 2021-03-09 MED ORDER — ENOXAPARIN SODIUM 40 MG/0.4ML ~~LOC~~ SOLN
40.0000 mg | SUBCUTANEOUS | Status: DC
  Administered 2021-03-10 – 2021-03-12 (×3): 40 mg via SUBCUTANEOUS
  Filled 2021-03-09 (×3): qty 0.4

## 2021-03-09 MED ORDER — ACETAMINOPHEN 500 MG PO TABS
1000.0000 mg | ORAL_TABLET | Freq: Four times a day (QID) | ORAL | Status: AC
  Administered 2021-03-09 – 2021-03-10 (×4): 1000 mg via ORAL
  Filled 2021-03-09 (×4): qty 2

## 2021-03-09 MED ORDER — BUSPIRONE HCL 10 MG PO TABS
10.0000 mg | ORAL_TABLET | Freq: Two times a day (BID) | ORAL | Status: DC
  Administered 2021-03-09 – 2021-03-12 (×6): 10 mg via ORAL
  Filled 2021-03-09 (×6): qty 1

## 2021-03-09 MED ORDER — LIDOCAINE HCL (CARDIAC) PF 100 MG/5ML IV SOSY
PREFILLED_SYRINGE | INTRAVENOUS | Status: DC | PRN
  Administered 2021-03-09: 40 mg via INTRAVENOUS

## 2021-03-09 MED ORDER — PHENYLEPHRINE HCL (PRESSORS) 10 MG/ML IV SOLN
INTRAVENOUS | Status: AC
  Filled 2021-03-09: qty 1

## 2021-03-09 MED ORDER — PROPOFOL 10 MG/ML IV BOLUS
INTRAVENOUS | Status: AC
  Filled 2021-03-09: qty 60

## 2021-03-09 MED ORDER — PANTOPRAZOLE SODIUM 40 MG PO TBEC
40.0000 mg | DELAYED_RELEASE_TABLET | Freq: Every day | ORAL | Status: DC
  Administered 2021-03-09 – 2021-03-12 (×4): 40 mg via ORAL
  Filled 2021-03-09 (×4): qty 1

## 2021-03-09 MED ORDER — DEXMEDETOMIDINE (PRECEDEX) IN NS 20 MCG/5ML (4 MCG/ML) IV SYRINGE
PREFILLED_SYRINGE | INTRAVENOUS | Status: DC | PRN
  Administered 2021-03-09 (×2): 4 ug via INTRAVENOUS

## 2021-03-09 MED ORDER — MIDAZOLAM HCL 5 MG/5ML IJ SOLN
INTRAMUSCULAR | Status: DC | PRN
  Administered 2021-03-09: 1.5 mg via INTRAVENOUS

## 2021-03-09 MED ORDER — ONDANSETRON HCL 4 MG/2ML IJ SOLN: 4.0000 mg | Freq: Four times a day (QID) | INTRAMUSCULAR | Status: DC | PRN

## 2021-03-09 MED ORDER — ACETAMINOPHEN 500 MG PO TABS
ORAL_TABLET | ORAL | Status: AC
  Administered 2021-03-09: 1000 mg via ORAL
  Filled 2021-03-09: qty 2

## 2021-03-09 MED ORDER — PROPOFOL 500 MG/50ML IV EMUL
INTRAVENOUS | Status: AC
  Filled 2021-03-09: qty 50

## 2021-03-09 MED ORDER — ALUM & MAG HYDROXIDE-SIMETH 200-200-20 MG/5ML PO SUSP: 30.0000 mL | ORAL | Status: DC | PRN

## 2021-03-09 MED ORDER — CEFAZOLIN SODIUM-DEXTROSE 2-4 GM/100ML-% IV SOLN
INTRAVENOUS | Status: AC
  Filled 2021-03-09: qty 100

## 2021-03-09 MED ORDER — CALCIUM CARBONATE-VITAMIN D 500-200 MG-UNIT PO TABS
1.0000 | ORAL_TABLET | Freq: Every day | ORAL | Status: DC
  Administered 2021-03-09 – 2021-03-12 (×4): 1 via ORAL
  Filled 2021-03-09 (×4): qty 1

## 2021-03-09 MED ORDER — VITAMIN B-12 1000 MCG PO TABS
1000.0000 ug | ORAL_TABLET | Freq: Every day | ORAL | Status: DC
  Administered 2021-03-09 – 2021-03-12 (×4): 1000 ug via ORAL
  Filled 2021-03-09 (×2): qty 1

## 2021-03-09 MED ORDER — GLYCOPYRROLATE 0.2 MG/ML IJ SOLN
INTRAMUSCULAR | Status: DC | PRN
  Administered 2021-03-09: .2 mg via INTRAVENOUS

## 2021-03-09 MED ORDER — TRAZODONE HCL 100 MG PO TABS
100.0000 mg | ORAL_TABLET | Freq: Every day | ORAL | Status: DC
  Administered 2021-03-09 – 2021-03-11 (×3): 100 mg via ORAL
  Filled 2021-03-09 (×3): qty 1

## 2021-03-09 MED ORDER — LIDOCAINE HCL (PF) 2 % IJ SOLN
INTRAMUSCULAR | Status: AC
  Filled 2021-03-09: qty 5

## 2021-03-09 MED ORDER — ONDANSETRON HCL 4 MG/2ML IJ SOLN
INTRAMUSCULAR | Status: AC
  Filled 2021-03-09: qty 2

## 2021-03-09 MED ORDER — BUPIVACAINE LIPOSOME 1.3 % IJ SUSP
INTRAMUSCULAR | Status: AC
  Filled 2021-03-09: qty 40

## 2021-03-09 MED ORDER — OXYCODONE HCL 5 MG PO TABS
5.0000 mg | ORAL_TABLET | ORAL | Status: DC | PRN
  Administered 2021-03-09: 10 mg via ORAL
  Administered 2021-03-09: 5 mg via ORAL
  Filled 2021-03-09: qty 2
  Filled 2021-03-09: qty 1

## 2021-03-09 MED ORDER — GLYCOPYRROLATE 0.2 MG/ML IJ SOLN
INTRAMUSCULAR | Status: AC
  Filled 2021-03-09: qty 1

## 2021-03-09 MED ORDER — ONDANSETRON HCL 4 MG PO TABS: 4.0000 mg | ORAL_TABLET | Freq: Four times a day (QID) | ORAL | Status: DC | PRN

## 2021-03-09 MED ORDER — SENNOSIDES-DOCUSATE SODIUM 8.6-50 MG PO TABS
1.0000 | ORAL_TABLET | Freq: Every evening | ORAL | Status: DC | PRN
Start: 2021-03-09 — End: 2021-03-12

## 2021-03-09 MED ORDER — FAMOTIDINE 20 MG PO TABS
ORAL_TABLET | ORAL | Status: AC
  Filled 2021-03-09: qty 1

## 2021-03-09 MED ORDER — DOCUSATE SODIUM 100 MG PO CAPS
100.0000 mg | ORAL_CAPSULE | Freq: Two times a day (BID) | ORAL | Status: DC
  Administered 2021-03-09 – 2021-03-12 (×7): 100 mg via ORAL
  Filled 2021-03-09 (×7): qty 1

## 2021-03-09 MED ORDER — CEFAZOLIN SODIUM-DEXTROSE 2-4 GM/100ML-% IV SOLN
2.0000 g | Freq: Four times a day (QID) | INTRAVENOUS | Status: AC
  Administered 2021-03-09 (×2): 2 g via INTRAVENOUS
  Filled 2021-03-09 (×2): qty 100

## 2021-03-09 MED ORDER — FENTANYL CITRATE (PF) 100 MCG/2ML IJ SOLN
INTRAMUSCULAR | Status: AC
  Administered 2021-03-09: 25 ug via INTRAVENOUS
  Filled 2021-03-09: qty 2

## 2021-03-09 MED ORDER — CITALOPRAM HYDROBROMIDE 20 MG PO TABS
40.0000 mg | ORAL_TABLET | Freq: Every day | ORAL | Status: DC
  Administered 2021-03-10 – 2021-03-12 (×3): 40 mg via ORAL
  Filled 2021-03-09 (×3): qty 2

## 2021-03-09 MED ORDER — HYDROMORPHONE HCL 1 MG/ML IJ SOLN
0.5000 mg | INTRAMUSCULAR | Status: DC | PRN
  Administered 2021-03-09 – 2021-03-10 (×4): 0.5 mg via INTRAVENOUS
  Filled 2021-03-09 (×5): qty 1

## 2021-03-09 MED ORDER — TRAMADOL HCL 50 MG PO TABS
50.0000 mg | ORAL_TABLET | Freq: Four times a day (QID) | ORAL | Status: DC
Start: 2021-03-09 — End: 2021-03-12
  Administered 2021-03-09 – 2021-03-12 (×13): 50 mg via ORAL
  Filled 2021-03-09 (×13): qty 1

## 2021-03-09 MED ORDER — ONDANSETRON HCL 4 MG/2ML IJ SOLN: 4.0000 mg | Freq: Once | INTRAMUSCULAR | Status: DC | PRN

## 2021-03-09 MED ORDER — ACETAMINOPHEN 325 MG PO TABS: 325.0000 mg | ORAL_TABLET | Freq: Four times a day (QID) | ORAL | Status: DC | PRN

## 2021-03-09 MED ORDER — BUPIVACAINE-EPINEPHRINE 0.25% -1:200000 IJ SOLN
INTRAMUSCULAR | Status: DC | PRN
  Administered 2021-03-09: 60 mL

## 2021-03-09 MED ORDER — TRANEXAMIC ACID 1000 MG/10ML IV SOLN
INTRAVENOUS | Status: AC
  Filled 2021-03-09: qty 10

## 2021-03-09 MED ORDER — MORPHINE SULFATE 4 MG/ML IJ SOLN
INTRAMUSCULAR | Status: DC | PRN
  Administered 2021-03-09: 4 mg via INTRAMUSCULAR

## 2021-03-09 MED ORDER — BUPIVACAINE HCL (PF) 0.5 % IJ SOLN
INTRAMUSCULAR | Status: AC
  Filled 2021-03-09: qty 10

## 2021-03-09 MED ORDER — SODIUM CHLORIDE 0.9 % IV SOLN
INTRAVENOUS | Status: DC | PRN
  Administered 2021-03-09: 25 ug/min via INTRAVENOUS

## 2021-03-09 MED ORDER — EPINEPHRINE PF 1 MG/ML IJ SOLN
INTRAMUSCULAR | Status: AC
  Filled 2021-03-09: qty 4

## 2021-03-09 MED ORDER — ONDANSETRON HCL 4 MG/2ML IJ SOLN
INTRAMUSCULAR | Status: DC | PRN
  Administered 2021-03-09: 4 mg via INTRAVENOUS

## 2021-03-09 SURGICAL SUPPLY — 64 items
BLADE SAW 90X13X1.19 OSCILLAT (BLADE) ×2 IMPLANT
BLADE SAW 90X25X1.19 OSCILLAT (BLADE) ×2 IMPLANT
CANISTER SUCT 3000ML PPV (MISCELLANEOUS) ×2 IMPLANT
CEMENT HV SMART SET (Cement) ×4 IMPLANT
CEMENT TIBIA MBT (Knees) ×1 IMPLANT
CNTNR SPEC 2.5X3XGRAD LEK (MISCELLANEOUS) ×1
CONT SPEC 4OZ STER OR WHT (MISCELLANEOUS) ×1
CONT SPEC 4OZ STRL OR WHT (MISCELLANEOUS) ×1
CONTAINER SPEC 2.5X3XGRAD LEK (MISCELLANEOUS) ×1 IMPLANT
COOLER POLAR GLACIER W/PUMP (MISCELLANEOUS) ×2 IMPLANT
COVER WAND RF STERILE (DRAPES) ×2 IMPLANT
CUFF TOURN SGL QUICK 24 (TOURNIQUET CUFF) ×2
CUFF TRNQT CYL 24X4X16.5-23 (TOURNIQUET CUFF) ×1 IMPLANT
DRAPE 3/4 80X56 (DRAPES) ×4 IMPLANT
DRAPE IMP U-DRAPE 54X76 (DRAPES) ×4 IMPLANT
DRAPE INCISE IOBAN 66X60 STRL (DRAPES) ×2 IMPLANT
DRAPE SURG 17X11 SM STRL (DRAPES) ×4 IMPLANT
DRSG AQUACEL AG ADV 3.5X10 (GAUZE/BANDAGES/DRESSINGS) ×2 IMPLANT
DURAPREP 26ML APPLICATOR (WOUND CARE) ×6 IMPLANT
ELECT REM PT RETURN 9FT ADLT (ELECTROSURGICAL) ×2
ELECTRODE REM PT RTRN 9FT ADLT (ELECTROSURGICAL) ×1 IMPLANT
FEMUR SIGMA PS SZ 3.0 R (Femur) ×2 IMPLANT
GAUZE SPONGE 4X4 12PLY STRL (GAUZE/BANDAGES/DRESSINGS) ×2 IMPLANT
GLOVE SRG 8 PF TXTR STRL LF DI (GLOVE) ×1 IMPLANT
GLOVE SURG 9.0 ORTHO LTXF (GLOVE) ×4 IMPLANT
GLOVE SURG ENC TEXT LTX SZ7.5 (GLOVE) ×2 IMPLANT
GLOVE SURG UNDER POLY LF SZ8 (GLOVE) ×2
GLOVE SURG UNDER POLY LF SZ9 (GLOVE) ×2 IMPLANT
GOWN STRL REUS TWL 2XL XL LVL4 (GOWN DISPOSABLE) ×2 IMPLANT
GOWN STRL REUS W/ TWL LRG LVL3 (GOWN DISPOSABLE) ×1 IMPLANT
GOWN STRL REUS W/ TWL LRG LVL4 (GOWN DISPOSABLE) ×1 IMPLANT
GOWN STRL REUS W/TWL LRG LVL3 (GOWN DISPOSABLE) ×2
GOWN STRL REUS W/TWL LRG LVL4 (GOWN DISPOSABLE) ×2
HOLDER FOLEY CATH W/STRAP (MISCELLANEOUS) ×2 IMPLANT
IMMBOLIZER KNEE 19 BLUE UNIV (SOFTGOODS) ×2 IMPLANT
IV NS IRRIG 3000ML ARTHROMATIC (IV SOLUTION) ×2 IMPLANT
KIT TURNOVER KIT A (KITS) ×2 IMPLANT
MANIFOLD NEPTUNE II (INSTRUMENTS) ×4 IMPLANT
NDL SAFETY ECLIPSE 18X1.5 (NEEDLE) ×1 IMPLANT
NEEDLE HYPO 18GX1.5 SHARP (NEEDLE) ×2
NEEDLE HYPO 22GX1.5 SAFETY (NEEDLE) ×2 IMPLANT
NEEDLE SPNL 20GX3.5 QUINCKE YW (NEEDLE) ×2 IMPLANT
NS IRRIG 1000ML POUR BTL (IV SOLUTION) ×2 IMPLANT
PACK TOTAL KNEE (MISCELLANEOUS) ×2 IMPLANT
PAD WRAPON POLAR KNEE (MISCELLANEOUS) ×1 IMPLANT
PATELLA DOME PFC 35MM (Knees) ×2 IMPLANT
PENCIL SMOKE EVACUATOR COATED (MISCELLANEOUS) ×2 IMPLANT
PLATE ROT INSERT 12.5MM SIZE 3 (Plate) ×2 IMPLANT
PULSAVAC PLUS IRRIG FAN TIP (DISPOSABLE) ×2
SPONGE LAP 18X18 RF (DISPOSABLE) ×2 IMPLANT
STAPLER SKIN PROX 35W (STAPLE) ×2 IMPLANT
SUCTION FRAZIER HANDLE 10FR (MISCELLANEOUS) ×2
SUCTION TUBE FRAZIER 10FR DISP (MISCELLANEOUS) ×1 IMPLANT
SUT ETHIBOND NAB CT1 #1 30IN (SUTURE) ×4 IMPLANT
SUT VIC AB 0 CT1 36 (SUTURE) ×2 IMPLANT
SUT VIC AB 2-0 CT1 (SUTURE) ×4 IMPLANT
SYR 20ML LL LF (SYRINGE) ×2 IMPLANT
SYR 30ML LL (SYRINGE) ×4 IMPLANT
TIBIA MBT CEMENT (Knees) ×2 IMPLANT
TIP FAN IRRIG PULSAVAC PLUS (DISPOSABLE) ×1 IMPLANT
TOWER CARTRIDGE SMART MIX (DISPOSABLE) ×2 IMPLANT
TRAY FOLEY MTR SLVR 16FR STAT (SET/KITS/TRAYS/PACK) ×2 IMPLANT
TUBE SUCT KAM VAC (TUBING) ×2 IMPLANT
WRAPON POLAR PAD KNEE (MISCELLANEOUS) ×2

## 2021-03-09 NOTE — Op Note (Signed)
DATE OF SURGERY:  03/09/2021 TIME: 11:27 AM  PATIENT NAME:  Nancy Blackburn   AGE: 75 y.o.    PRE-OPERATIVE DIAGNOSIS:  Right Knee Osteoarthritis  POST-OPERATIVE DIAGNOSIS:  Same  PROCEDURE:  Procedure(s): Right TOTAL KNEE ARTHROPLASTY  SURGEON:  Thornton Park, MD   ASSISTANT:  Roland Rack, PA  OPERATIVE IMPLANTS: Depuy PFC Sigma, Posterior Stabilized Femural component size 3, Tibia size rotating platform component size 3, Patella polyethylene 3-peg oval button size 35, with a 12.5 mm polyethylene insert.  EBL:  10 ml  TOURNIQUET TIME:  130 minutes  PREOPERATIVE INDICATIONS:  Nancy Blackburn is an 75 y.o. female who has a diagnosis of  Right Knee Osteoarthritis and elected for a right total knee arthroplasty after failing nonoperative treatment.  Her knee pain significantly impacts her activity of daily living including ambulation.  Radiographs have demonstrated tricompartmental osteoarthritis joint space narrowing, osteophytes and subchondral sclerosis.  The risks, benefits, and alternatives were discussed at length including but not limited to the risks of infection, bleeding, nerve or blood vessel injury, knee stiffness, fracture, dislocation, loosening or failure of the hardware and the need for further surgery. Medical risks include but not limited to DVT and pulmonary embolism, myocardial infarction, stroke, pneumonia, respiratory failure and death. I discussed these risks with the patient in my office prior to the date of surgery. They understood these risks and were willing to proceed.  OPERATIVE FINDINGS AND UNIQUE ASPECTS OF THE CASE: Tricompartmental osteoarthritis most severe in the patellofemoral joint  OPERATIVE DESCRIPTION:  The patient was brought to the operative room and placed in a supine position after undergoing placement of a spinal anesthetic.  A Foley catheter was placed.  IV antibiotics were given. Patient received Ancef and clindamycin IV prior to  the onset of the case.  Patient also received transexamic acid. The lower extremity was prepped and draped in the usual sterile fashion.  A time out was performed to verify the patient's name, date of birth, medical record number, correct site of surgery and correct procedure to be performed. The timeout was also used to confirm the patient received antibiotics and that appropriate instruments, implants and radiographs studies were available in the room.  The leg was elevated and exsanguinated with an Esmarch and the tourniquet was inflated to 275 mmHg for 130 minutes..  A midline incision was made over the right knee. Full-thickness skin flaps were developed. A medial parapatellar arthrotomy was then made and the patella everted and the knee was brought into 90 of flexion. Hoffa's fat pad along with the cruciate ligaments and medial and lateral menisci were resected.   The distal femoral intramedullary canal was opened with a drill and the intramedullary distal femoral cutting jig was inserted into the femoral canal pinned into position. It was set at 5 degrees resecting 9 mm off the distal femur.  Care was taken to protect the collateral ligaments during distal femoral resection.  The distal femoral resection was performed with an oscillating saw. The femoral cutting guide was then removed.  The extramedullary tibial cutting guide was then placed using the anterior tibial crest and second ray of the foot as a references.  The tibial cutting guide was adjusted to allow for appropriate posterior slope.  The tibial cutting block was pinned into position. The slotted stylus was used to measure the proximal tibial resection of 10 mm off the high lateral side.  The tibial long rod alignment guide was then used to confirm position of the  cutting block. A third cross pin through the tibial cutting block was then drilled into position to allow for rotational stability. Care was taken during the tibial resection to  protect the medial and collateral ligaments.  The resected tibial bone was removed along with the posterior horns of the menisci.  The PCL was sacrificed.  Extension gap was measured with a spacer block and alignment and extension was confirmed using a long alignment rod.  The attention was then turned back to the femur. The posterior referencing distal femoral sizing guide was applied to the distal femur.  The femur was sized to be a size 3. Rotation of the referencing guide was checked with the epicondylar axis and Whitesides line. Then the 4-in-1 cutting jig was then applied to the distal femur. A stylus was used to confirm that the anterior femur would not be notched.   Then the anterior, posterior and chamfer femoral cuts were then made with an oscillating saw.  The flexion gap was then measured with a flexion spacer block and long alignment rod and was found to be symmetric with the extension gap and perpendicular to mechanical axis of the tibia.  The distal femoral preparation was completed by performing the posterior stabilized box cut using the cutting block. The entry site for the intramedullary femoral guide was filled with autologous bone graft from bone previously resected earlier in the case.  The proximal tibia plateau was then sized with trial trays. The best coverage was achieved with a size 3. This tibial tray was then pinned into position. The proximal tibia was then prepared with the reamer and keel punch.  After tibial preparation was completed, all trial components were inserted with polyethylene trials.  The knee was found to have excellent balance and full motion with a size 10 mm tibial polyethylene insert..    The attention was then turned to preparation of the patella. The thickness of the patella was measured with a caliper, the diameter measured with the patella templates to be a size 35.  The patella resection was then made with an oscillating saw using the patella cutting  guide.   Three peg holes for the patella component were then drilled. The trial patella was then placed. Knee was taken through a full range of motion and deemed to be stable with the trial components. All trial components were then removed. The knee capsule was then injected with Exparel.  The knee joint capsule was injected with a mixture of quarter percent Marcaine and morphine to assist with postoperative pain relief.  The joint was copiously irrigated with pulse lavage.  The final total knee arthroplasty components were then cemented into place with a 10 mm trial polyethylene insert and all excess methylmethacrylate was removed.  The joint was again copiously irrigated. After the cement had hardened the knee was again taken through a full range of motion. It was felt to be most stable with the 12.5 mm tibial polyethylene insert. The actual tibial polyethylene insert was then placed.   The knee was taken through a range of motion and the patella tracked well and the knee was again irrigated copiously.    The medial arthrotomy was closed with #1 Ethibond. The subcutaneous tissue closed with 0 and 2-0 vicryl, and skin approximated with staples.  A dry sterile and compressive dressing was applied.  A Polar Care was applied to the operative knee along with a knee immobilizer.  The patient was awakened and brought to the PACU in  stable and satisfactory condition.  All sharp, lap and instrument counts were correct at the conclusion the case. I spoke with the patient's friend, Driscilla Grammes, by phone from the PACU in the postop consultation room to let her know the case had been performed without complication and the patient was stable in recovery room.

## 2021-03-09 NOTE — Anesthesia Preprocedure Evaluation (Signed)
Anesthesia Evaluation  Patient identified by MRN, date of birth, ID band Patient awake    Reviewed: Allergy & Precautions, H&P , NPO status , Patient's Chart, lab work & pertinent test results  History of Anesthesia Complications (+) PONVNegative for: history of anesthetic complications  Airway Mallampati: II  TM Distance: >3 FB     Dental   Pulmonary shortness of breath, asthma , neg sleep apnea, neg COPD,    breath sounds clear to auscultation       Cardiovascular hypertension, (-) angina(-) Past MI and (-) Cardiac Stents (-) dysrhythmias  Rhythm:regular Rate:Normal     Neuro/Psych PSYCHIATRIC DISORDERS Anxiety Depression H/o L4-5 lumbar fusion negative neurological ROS     GI/Hepatic Neg liver ROS, PUD, GERD  Controlled,  Endo/Other  negative endocrine ROS  Renal/GU      Musculoskeletal   Abdominal   Peds  Hematology negative hematology ROS (+)   Anesthesia Other Findings Past Medical History: No date: Asthma No date: Bradycardia 1978: Cervical cancer (HCC) No date: Chronic sinus infection No date: Ejection fraction     Comment:  a. EF 55-65%, echo, mild LVH, June, 2013, small               pericardial effusion No date: Fracture, sacrum/coccyx ([place]) No date: History of kidney stones No date: Hyperlipidemia     Comment:  a. takes Simvastatin No date: Hypertension     Comment:  a. takes Metoprolol, Lisinopril, and amlodipine. No date: Low back pain No date: Mild dementia ([place]) No date: Nasal polyps No date: Normal nuclear stress test     Comment:  a. 07/2005 Nl nuc stress test. No date: Osteopenia No date: Osteoporosis No date: Palpitations     Comment:  a. June 2013:  Event recorder normal sinus rhythm with               rare PVCs - ? symptomatic PVC's. No date: Parkinson disease ([place]) No date: Pericardial effusion     Comment:  a. Small, echo, June, 2013 No date: PONV (postoperative nausea and  vomiting)  Past Surgical History: 08/2010: BACK SURGERY 2003: BLADDER SUSPENSION 2009: bone spur     Comment:  removed from right collar bone area and partial collar               bone removed No date: CATARACT EXTRACTION W/ INTRAOCULAR LENS  IMPLANT, BILATERAL No date: EYE SURGERY 2011, 2012: LUMBAR FUSION 2012: SHOULDER ARTHROSCOPY; Right 1976: THYROIDECTOMY, PARTIAL No date: TONSILLECTOMY     Comment:  as child 65: TUBAL LIGATION 1978: VAGINAL HYSTERECTOMY     Comment:  d/t cervical cancer  BMI    Body Mass Index: 28.35 kg/m      Reproductive/Obstetrics negative OB ROS                            Anesthesia Physical Anesthesia Plan  ASA: II  Anesthesia Plan: Spinal   Post-op Pain Management:    Induction:   PONV Risk Score and Plan: Propofol infusion and Ondansetron  Airway Management Planned: Simple Face Mask  Additional Equipment:   Intra-op Plan:   Post-operative Plan:   Informed Consent: I have reviewed the patients History and Physical, chart, labs and discussed the procedure including the risks, benefits and alternatives for the proposed anesthesia with the patient or authorized representative who has indicated his/her understanding and acceptance.     Dental Advisory Given  Plan Discussed with: Anesthesiologist,  CRNA and Surgeon  Anesthesia Plan Comments:         Anesthesia Quick Evaluation

## 2021-03-09 NOTE — Progress Notes (Addendum)
  Subjective:  POST OP CHECK s/p right total knee arthroplasty.   Patient reports that her right knee pain initially when she got to the floor with severe but is much better now.  Right knee pain currently is rated as mild to moderate.  Patient is sitting up in bed eating dinner.  Patient states she was able to participate with physical therapy this afternoon.  Objective:   VITALS:   Vitals:   03/09/21 1225 03/09/21 1226 03/09/21 1245 03/09/21 1500  BP:  (!) 124/55 136/60 (!) 186/68  Pulse: 76 76 72 62  Resp: 19  16 18   Temp:   97.7 F (36.5 C) 97.9 F (36.6 C)  TempSrc:      SpO2: 93% 95% 96% 100%  Weight:      Height:        PHYSICAL EXAM: Right lower extremity Neurovascular intact Sensation intact distally Intact pulses distally Dorsiflexion/Plantar flexion intact Incision: dressing C/D/I No cellulitis present Compartment soft  LABS  No results found for this or any previous visit (from the past 24 hour(s)).  DG Knee Right Port  Result Date: 03/09/2021 CLINICAL DATA:  Patient status post right knee replacement today. EXAM: PORTABLE RIGHT KNEE - 1-2 VIEW COMPARISON:  None. FINDINGS: Total arthroplasty is in place. No fracture is identified. Hardware appears normal. Gas in the soft tissues and surgical staples noted. IMPRESSION: Status post right knee replacement.  No acute abnormality. Electronically Signed   By: Inge Rise M.D.   On: 03/09/2021 12:18    Assessment/Plan: Day of Surgery   Active Problems:   S/P TKR (total knee replacement) using cement, right   Patient is well postop.  Continue 24 hours of postop antibiotics.  Patient will begin Lovenox for DVT prophylaxis tomorrow.  Labs will be drawn in the a.m.  Patient will continue physical therapy tomorrow.  Patient will be percent partial weightbearing on the right lower extremity.  Foley catheter will be DC'd in the morning.  I reviewed the patient's postoperative x-rays which demonstrate the total knee  arthroplasty components are well-positioned.  There is no evidence of postop complication such as fracture dislocation or other osseous abnormality.   Thornton Park , MD 03/09/2021, 4:45 PM

## 2021-03-09 NOTE — Anesthesia Procedure Notes (Signed)
Spinal  Patient location during procedure: OR Start time: 03/09/2021 7:57 AM End time: 03/09/2021 8:04 AM Reason for block: surgical anesthesia Staffing Performed: resident/CRNA  Anesthesiologist: Tera Mater, MD Resident/CRNA: Lowry Bowl, CRNA Preanesthetic Checklist Completed: patient identified, IV checked, site marked, risks and benefits discussed, surgical consent, monitors and equipment checked, pre-op evaluation and timeout performed Spinal Block Patient position: sitting Prep: DuraPrep Patient monitoring: heart rate, cardiac monitor, continuous pulse ox and blood pressure Approach: midline Location: L2-3 Injection technique: single-shot Needle Needle type: Sprotte  Needle gauge: 24 G Needle length: 9 cm Assessment Sensory level: T4 Events: CSF return

## 2021-03-09 NOTE — Evaluation (Signed)
Physical Therapy Evaluation Patient Details Name: Nancy Blackburn MRN: [id number] DOB: 08/16/1946 Today's Date: 03/09/2021   History of Present Illness  Patient is a 75 year old female with right knee osteoarthritis who elected for total right knee arthroplasty. Past medical history positive for shortness of breath, asthma, anxiety, depression, GERD, hypertension, bradycardia, cervical cancer, Parkinson's disease, fracture of sacrum/coccyx, mild dementia, back surgery.    Clinical Impression  Patient was seen in coordination with pain medication administered earlier. Patient reports 5/10 right knee pain at rest that increased to 6/10 with activity. Minimal assistance for bed mobility with good sitting balance demonstrated at edge of bed. Patient dangled in sitting position for ~2 minutes and requested to return to bed due to pain. Patient was unable to attempt ambulation due to limited sitting tolerance. Patient is anticipating discharge to rehab facility at discharge. Recommend PT to maximize independence and facilitate return to prior level of function. SNF is recommended at discharge as patient reports she will not have adequate assistance at home (grandson lives with her but works).     Follow Up Recommendations SNF    Equipment Recommendations  Rolling walker with 5" wheels    Recommendations for Other Services       Precautions / Restrictions Precautions Precautions: Knee Precaution Booklet Issued: Yes (comment) Required Braces or Orthoses: Knee Immobilizer - Right Knee Immobilizer - Right:  (on at all time except with PT and while in CPM) Restrictions Weight Bearing Restrictions: Yes RLE Weight Bearing: Weight bearing as tolerated      Mobility  Bed Mobility Overal bed mobility: Needs Assistance Bed Mobility: Supine to Sit;Sit to Supine     Supine to sit: Min guard;HOB elevated Sit to supine: Min assist;HOB elevated   General bed mobility comments: verbal cues for  technique and sequencing. assistance for LLE required for return to bed. patient able to long sit independently    Transfers                 General transfer comment: unable to progress to standing due to limited sitting tolerance secondary to pain. patient started at 5/10 and increased to 6/10 with sitting upright and asked to return to bed after ~ 2 minutes  Ambulation/Gait                Stairs            Wheelchair Mobility    Modified Rankin (Stroke Patients Only)       Balance Overall balance assessment: Needs assistance Sitting-balance support: Feet supported;No upper extremity supported Sitting balance-Leahy Scale: Good Sitting balance - Comments: no loss of balance in sitting position.                                     Pertinent Vitals/Pain Pain Assessment: 0-10 Pain Score: 6  Pain Location: right knee Pain Descriptors / Indicators: Operative site guarding;Guarding Pain Intervention(s): Premedicated before session;Repositioned;Ice applied (polar care re-applied at end of session)    Home Living Family/patient expects to be discharged to:: Private residence Living Arrangements: Other (Comment) (patient reports her grandson lives with her but he works) Available Help at Discharge: Friend(s);Available PRN/intermittently Type of Home: House Home Access: Stairs to enter Entrance Stairs-Rails: Psychiatric nurse of Steps: 6 (6 steps for the front and 3 steps in the back) Home Layout: One level Home Equipment: Grab bars - tub/shower  Prior Function Level of Independence: Independent               Hand Dominance        Extremity/Trunk Assessment   Upper Extremity Assessment Upper Extremity Assessment: Overall WFL for tasks assessed    Lower Extremity Assessment Lower Extremity Assessment: RLE deficits/detail (LLE grossly WFL for functional activity) RLE Deficits / Details: unable to perform SLR  independently (likely due to pain). patient is able to activate hip/knee/ankle movement in gravity eliminated position RLE Sensation: WNL       Communication   Communication: No difficulties  Cognition Arousal/Alertness: Awake/alert Behavior During Therapy: Anxious Overall Cognitive Status: Within Functional Limits for tasks assessed                                        General Comments      Exercises Total Joint Exercises Ankle Circles/Pumps: AROM;Strengthening;10 reps;Supine;Right Heel Slides: AAROM;Strengthening;Right;10 reps;Supine Straight Leg Raises: AAROM;Strengthening;Right;10 reps;Supine Goniometric ROM: right knee 2-50 degrees. flexion measured in sitting position Other Exercises Other Exercises: verbal cues for exercise technique. issued HEP packet   Assessment/Plan    PT Assessment Patient needs continued PT services  PT Problem List Decreased range of motion;Decreased strength;Decreased balance;Decreased mobility;Decreased safety awareness;Decreased knowledge of precautions;Pain;Decreased knowledge of use of DME;Decreased activity tolerance       PT Treatment Interventions DME instruction;Gait training;Stair training;Functional mobility training;Therapeutic activities;Therapeutic exercise;Balance training;Neuromuscular re-education;Patient/family education    PT Goals (Current goals can be found in the Care Plan section)  Acute Rehab PT Goals Patient Stated Goal: to go to rehab at discharge PT Goal Formulation: With patient Time For Goal Achievement: 03/23/21 Potential to Achieve Goals: Good Additional Goals Additional Goal #1: patient will increase right knee flexion to 90 degrees for increased independence with functional mobility    Frequency BID   Barriers to discharge Decreased caregiver support      Co-evaluation               AM-PAC PT "6 Clicks" Mobility  Outcome Measure Help needed turning from your back to your side  while in a flat bed without using bedrails?: A Little Help needed moving from lying on your back to sitting on the side of a flat bed without using bedrails?: A Little Help needed moving to and from a bed to a chair (including a wheelchair)?: A Little Help needed standing up from a chair using your arms (e.g., wheelchair or bedside chair)?: A Little Help needed to walk in hospital room?: A Little Help needed climbing 3-5 steps with a railing? : A Little 6 Click Score: 18    End of Session   Activity Tolerance: Patient limited by pain Patient left: in bed;with call bell/phone within reach;with bed alarm set;with family/visitor present;with nursing/sitter in room;with SCD's reapplied (polar care re-applied right knee at end of session, towel roll placed distal BLE to promote knee extension. knee immobilizer replaced at end of session as well RLE) Nurse Communication: Mobility status PT Visit Diagnosis: Pain;Muscle weakness (generalized) (M62.81);Difficulty in walking, not elsewhere classified (R26.2) Pain - Right/Left: Right Pain - part of body: Knee    Time: 1400-1430 PT Time Calculation (min) (ACUTE ONLY): 30 min   Charges:   PT Evaluation $PT Eval Moderate Complexity: 1 Mod PT Treatments $Therapeutic Exercise: 8-22 mins        Minna Merritts, PT, MPT   Percell Locus 03/09/2021,  2:53 PM

## 2021-03-09 NOTE — H&P (Signed)
PREOPERATIVE H&P  Chief Complaint: Right Knee Osteoarthritis  HPI: Nancy Blackburn is a 75 y.o. female who presents for preoperative history and physical with a diagnosis of Right Knee Osteoarthritis. Symptoms of pain, swelling and limited range of motion are significantly impairing activities of daily living.  Patient has radiographic evidence of advanced osteoarthritis with joint space narrowing, subchondral sclerosis and osteophyte.  Patient has failed nonoperative management including NSAIDs, physical therapy, corticosteroid injection and hyaluronic acid injection and wished to proceed with a right total knee arthroplasty.  Past Medical History:  Diagnosis Date  . Asthma   . Bradycardia   . Cervical cancer ([place]) 1978  . Chronic sinus infection   . Ejection fraction    a. EF 55-65%, echo, mild LVH, June, 2013, small pericardial effusion  . Fracture, sacrum/coccyx ([place])   . History of kidney stones   . Hyperlipidemia    a. takes Simvastatin  . Hypertension    a. takes Metoprolol, Lisinopril, and amlodipine.  . Low back pain   . Mild dementia ([place])   . Nasal polyps   . Normal nuclear stress test    a. 07/2005 Nl nuc stress test.  . Osteopenia   . Osteoporosis   . Palpitations    a. June 2013:  Event recorder normal sinus rhythm with rare PVCs - ? symptomatic PVC's.  . Parkinson disease ([place])   . Pericardial effusion    a. Small, echo, June, 2013  . PONV (postoperative nausea and vomiting)    Past Surgical History:  Procedure Laterality Date  . BACK SURGERY  08/2010  . BLADDER SUSPENSION  2003  . bone spur  2009   removed from right collar bone area and partial collar bone removed  . CATARACT EXTRACTION W/ INTRAOCULAR LENS  IMPLANT, BILATERAL    . EYE SURGERY    . LUMBAR FUSION  2011, 2012  . SHOULDER ARTHROSCOPY Right 2012  . THYROIDECTOMY, PARTIAL  1976  . TONSILLECTOMY     as child  . TUBAL LIGATION  1977  . VAGINAL HYSTERECTOMY  1978   d/t cervical cancer    Social History   Socioeconomic History  . Marital status: Divorced    Spouse name: Not on file  . Number of children: 2  . Years of education: Not on file  . Highest education level: 12th grade  Occupational History  . Occupation: retired  Tobacco Use  . Smoking status: Never Smoker  . Smokeless tobacco: Never Used  Vaping Use  . Vaping Use: Never used  Substance and Sexual Activity  . Alcohol use: Yes    Alcohol/week: 0.0 standard drinks    Comment: rarely  . Drug use: No  . Sexual activity: Never  Other Topics Concern  . Not on file  Social History Narrative  . Not on file   Social Determinants of Health   Financial Resource Strain: Low Risk   . Difficulty of Paying Living Expenses: Not hard at all  Food Insecurity: No Food Insecurity  . Worried About Charity fundraiser in the Last Year: Never true  . Ran Out of Food in the Last Year: Never true  Transportation Needs: No Transportation Needs  . Lack of Transportation (Medical): No  . Lack of Transportation (Non-Medical): No  Physical Activity: Insufficiently Active  . Days of Exercise per Week: 2 days  . Minutes of Exercise per Session: 60 min  Stress: No Stress Concern Present  . Feeling of Stress : Not at all  Social Connections: Moderately Integrated  . Frequency of Communication with Friends and Family: More than three times a week  . Frequency of Social Gatherings with Friends and Family: More than three times a week  . Attends Religious Services: More than 4 times per year  . Active Member of Clubs or Organizations: Yes  . Attends Archivist Meetings: More than 4 times per year  . Marital Status: Divorced   Family History  Problem Relation Age of Onset  . Colon cancer Mother   . Heart murmur Mother   . Heart disease Mother   . Angina Mother   . Heart disease Father   . COPD Father   . Atrial fibrillation Brother   . Cushing syndrome Daughter   . Cancer Daughter        thyroid  .  Breast cancer Cousin   . Hypotension Neg Hx   . Malignant hyperthermia Neg Hx   . Pseudochol deficiency Neg Hx    Allergies  Allergen Reactions  . Anti-Inflammatory Enzyme [Nutritional Supplements] Other (See Comments)    REACTION: Due to bleeding ulcer  . Cymbalta [Duloxetine Hcl] Other (See Comments)    REACTION: Urinary retention  . Clarithromycin Other (See Comments)    Upsets stomach  . Donepezil Hcl Other (See Comments)    Significant bradycardia causing syncope  . Nsaids     GI BLEED  . Ranitidine     severe diarrhea  . Venlafaxine     severe depression   Prior to Admission medications   Medication Sig Start Date End Date Taking? Authorizing Provider  amLODipine (NORVASC) 5 MG tablet Take 1 tablet (5 mg total) by mouth daily. 02/16/21  Yes Jerrol Banana., MD  busPIRone (BUSPAR) 10 MG tablet Take 10 mg by mouth 2 (two) times daily.  05/09/17  Yes [provider]  Calcium Carb-Cholecalciferol (CALCIUM 600+D3) 600-800 MG-UNIT TABS Take 1 tablet by mouth daily.   Yes [provider]  citalopram (CELEXA) 40 MG tablet Take 40 mg by mouth daily. 08/06/15  Yes [provider]  fexofenadine (ALLEGRA) 180 MG tablet Take 180 mg by mouth daily.   Yes [provider]  primidone (MYSOLINE) 50 MG tablet Take 50 mg by mouth at bedtime.   Yes [provider]  rosuvastatin (CRESTOR) 10 MG tablet TAKE 1 TABLET EVERY DAY Patient taking differently: Take 10 mg by mouth daily. 09/18/20  Yes Jerrol Banana., MD  traZODone (DESYREL) 100 MG tablet Take 100 mg by mouth at bedtime.   Yes [provider]  vitamin B-12 (CYANOCOBALAMIN) 1000 MCG tablet Take 1,000 mcg by mouth daily.   Yes [provider]     Positive ROS: All other systems have been reviewed and were otherwise negative with the exception of those mentioned in the HPI and as above.  Physical Exam: General: Alert, no acute distress Cardiovascular: Regular rate  and rhythm, no murmurs rubs or gallops.  No pedal edema Respiratory: Clear to auscultation bilaterally, no wheezes rales or rhonchi. No cyanosis, no use of accessory musculature GI: No organomegaly, abdomen is soft and non-tender nondistended with positive bowel sounds. Skin: Skin intact, no lesions within the operative field. Neurologic: Sensation intact distally Psychiatric: Patient is competent for consent with normal mood and affect Lymphatic: No cervical lymphadenopathy  MUSCULOSKELETAL: Right knee: Patient skin is intact.  There is no erythema ecchymosis swelling or deformity.  Patient has no significant effusion or lower leg edema.  She has no  calf tenderness.  Patient has patellofemoral crepitus and grind but no apprehension.  She has tenderness over the medial joint line but a negative McMurray's test.  There is no ligamentous laxity.  She has 5-5 strength in her muscles, intact/light touch and palpable pedal pulses.  Assessment: Right Knee Osteoarthritis  Plan: Plan for Procedure(s): Right TOTAL KNEE ARTHROPLASTY  I reviewed the details of the operation as well as the postoperative course with the patient both this morning and previously in the office.  A preop patient physical was performed at the bedside this morning.  I reviewed the preop labs and radiographs in preparation for this case.  I discussed the risks and benefits of surgery. The risks include but are not limited to infection, bleeding requiring blood transfusion, nerve or blood vessel injury, joint stiffness or loss of motion, persistent pain, weakness or instability, hardware failure or loosening of the hardware and the need for further surgery. Medical risks include but are not limited to DVT and pulmonary embolism, myocardial infarction, stroke, pneumonia, respiratory failure and death. Patient understood these risks and wished to proceed.   Patient has no history of stroke, myocardial infarction, cardiac stents or  history of DVT or other clotting disorders.  She is therefore a candidate for use of tranexamic acid during surgery today.  I answered all the patient's questions.   Thornton Park, MD   03/09/2021 7:52 AM

## 2021-03-09 NOTE — Transfer of Care (Signed)
Immediate Anesthesia Transfer of Care Note  [name]  Procedure(s) Performed: Right TOTAL KNEE ARTHROPLASTY (Right Knee)  Patient Location: PACU  Anesthesia Type:Spinal  Level of Consciousness: awake, drowsy and patient cooperative  Airway & Oxygen Therapy: Patient Spontanous Breathing  Post-op Assessment: Report given to RN and Post -op Vital signs reviewed and stable  Post vital signs: Reviewed and stable  Last Vitals:  Vitals Value Taken Time  BP 99/49 03/09/21 1108  Temp 36.7 C 03/09/21 1108  Pulse 98 03/09/21 1110  Resp 22 03/09/21 1110  SpO2 94 % 03/09/21 1110  Vitals shown include unvalidated device data.  Last Pain:  Vitals:   03/09/21 0626  TempSrc: Temporal  PainSc: 0-No pain         Complications: No complications documented.

## 2021-03-10 ENCOUNTER — Encounter: Payer: Self-pay | Admitting: Orthopedic Surgery

## 2021-03-10 LAB — BASIC METABOLIC PANEL
Anion gap: 5 (ref 5–15)
BUN: 13 mg/dL (ref 8–23)
CO2: 27 mmol/L (ref 22–32)
Calcium: 8.8 mg/dL — ABNORMAL LOW (ref 8.9–10.3)
Chloride: 102 mmol/L (ref 98–111)
Creatinine, Ser: 0.94 mg/dL (ref 0.44–1.00)
GFR, Estimated: >60 mL/min (ref >60)
Glucose, Bld: 128 mg/dL — ABNORMAL HIGH (ref 70–99)
Potassium: 4.2 mmol/L (ref 3.5–5.1)
Sodium: 134 mmol/L — ABNORMAL LOW (ref 135–145)

## 2021-03-10 LAB — CBC
HCT: 39.1 % (ref 36.0–46.0)
Hemoglobin: 12.8 g/dL (ref 12.0–15.0)
MCH: 29.4 pg (ref 26.0–34.0)
MCHC: 32.7 g/dL (ref 30.0–36.0)
MCV: 89.9 fL (ref 80.0–100.0)
Platelets: 207 10*3/uL (ref 150–400)
RBC: 4.35 MIL/uL (ref 3.87–5.11)
RDW: 12.3 % (ref 11.5–15.5)
WBC: 9.5 10*3/uL (ref 4.0–10.5)
nRBC: 0 % (ref 0.0–0.2)

## 2021-03-10 NOTE — Progress Notes (Signed)
Physical Therapy Treatment Patient Details Name: Nancy Blackburn MRN: [id number] DOB: 1946-03-11 Today's Date: 03/10/2021    History of Present Illness Patient is a 75 year old female with right knee osteoarthritis who elected for total right knee arthroplasty. Past medical history positive for shortness of breath, asthma, anxiety, depression, GERD, hypertension, bradycardia, cervical cancer, Parkinsons disease, fracture of sacrum/coccyx, mild dementia, back surgery.    PT Comments    Pt seen this am after receiving pain meds.  Remains anxious about moving and fearful of increasing pain level. Pt required MinA for bed mobility and scooting to edge of bed primarily for management of R LE.  Right knee flexion AAROM in sitting 2-75.  Pt required ModA for transfers to/from [facility] with repeated vc's for proper technique to reduce falls and maintain safety awareness. Pt c/o 9/10 pain with OOB activity, unable to tolerate sitting up in chair this am, pt assisted back to bed, reviewed therex and proper positioning. Will attempt to progress mobility this afternoon, continue to recommend SNF for short term rehab prior to returning home.     Follow Up Recommendations  SNF     Equipment Recommendations  Rolling walker with 5" wheels    Recommendations for Other Services       Precautions / Restrictions Precautions Precautions: Knee Precaution Booklet Issued: Yes (comment) Required Braces or Orthoses: Knee Immobilizer - Right Knee Immobilizer - Right: On at all times (off during PT) Restrictions Weight Bearing Restrictions: Yes RLE Weight Bearing: Weight bearing as tolerated Other Position/Activity Restrictions:  (educated pt to not raise knee flexion on bed.)    Mobility  Bed Mobility Overal bed mobility: Needs Assistance Bed Mobility: Supine to Sit;Sit to Supine     Supine to sit: Min guard;HOB elevated Sit to supine: Min assist;HOB elevated   General bed mobility comments: verbal  cues for technique and sequencing. assistance for LLE required for return to bed. patient able to long sit independently    Transfers Overall transfer level: Needs assistance Equipment used: Rolling walker (2 wheeled) Transfers: Sit to/from Stand Sit to Stand: Mod assist         General transfer comment: Pt very anxious during transfers, afraid of increased pain. Pt required vc's for pacing and proper technique for safe transfer  Ambulation/Gait                 Stairs             Wheelchair Mobility    Modified Rankin (Stroke Patients Only)       Balance                                            Cognition Arousal/Alertness: Awake/alert Behavior During Therapy: Anxious Overall Cognitive Status: Within Functional Limits for tasks assessed                                        Exercises Total Joint Exercises Ankle Circles/Pumps: AROM;Strengthening;10 reps;Supine;Right Heel Slides: AAROM;Strengthening;Right;10 reps;Supine Straight Leg Raises: AAROM;Strengthening;Right;10 reps;Supine Goniometric ROM: Right knee flexion in sitting 2-75    General Comments        Pertinent Vitals/Pain Pain Assessment: 0-10 Pain Score: 9  Pain Location: right knee Pain Descriptors / Indicators: Operative site guarding;Guarding Pain Intervention(s): Limited activity within patient's  tolerance    Home Living                      Prior Function            PT Goals (current goals can now be found in the care plan section) Acute Rehab PT Goals Patient Stated Goal: to go to rehab at discharge    Frequency    BID      PT Plan Current plan remains appropriate    Co-evaluation              AM-PAC PT "6 Clicks" Mobility   Outcome Measure  Help needed turning from your back to your side while in a flat bed without using bedrails?: A Little Help needed moving from lying on your back to sitting on the side of a  flat bed without using bedrails?: A Little Help needed moving to and from a bed to a chair (including a wheelchair)?: A Little Help needed standing up from a chair using your arms (e.g., wheelchair or bedside chair)?: A Little Help needed to walk in hospital room?: A Little Help needed climbing 3-5 steps with a railing? : A Little 6 Click Score: 18    End of Session Equipment Utilized During Treatment: Gait belt Activity Tolerance: Patient limited by pain Patient left: in bed;with call bell/phone within reach;with bed alarm set;with SCD's reapplied (knee immobilizer on) Nurse Communication: Mobility status PT Visit Diagnosis: Pain;Muscle weakness (generalized) (M62.81);Difficulty in walking, not elsewhere classified (R26.2) Pain - Right/Left: Right Pain - part of body: Knee     Time: 4628-6381 PT Time Calculation (min) (ACUTE ONLY): 54 min  Charges:  $Therapeutic Exercise: 8-22 mins $Therapeutic Activity: 38-52 mins                     Mikel Cella, PTA   Nancy Blackburn 03/10/2021, 10:54 AM

## 2021-03-10 NOTE — Progress Notes (Signed)
OT Cancellation Note  Patient Details Name: SAN RUA MRN: [id number] DOB: 01/12/1946   Cancelled Treatment:    Reason Eval/Treat Not Completed: Pain limiting ability to participate. Consult received, chart reviewed. Spoke with PTA who just finished working with pt. Per PTA, pt very pain limited. Will re-attempt OT evaluation this afternoon in coordination with next pain medication administration to optimize ability to participate.   Hanley Hays, MPH, MS, OTR/L ascom [phone] 03/10/21, 10:35 AM

## 2021-03-10 NOTE — Progress Notes (Signed)
OT Cancellation Note  Patient Details Name: CHEYEANNE ROADCAP MRN: [id number] DOB: [birth year]   Cancelled Treatment:    Reason Eval/Treat Not Completed: Pain limiting ability to participate. On 2nd attempt coordinated with timing of pain medication with RN, pt endorsing pain is better but still 6/10 at rest. Appears a bit groggy. Pt declines 2/2 pain and requests OT come back after lunch and next pain medication dosing. Will re-attempt.  Hanley Hays, MPH, MS, OTR/L ascom [phone] 03/10/21, 11:44 AM

## 2021-03-10 NOTE — Anesthesia Postprocedure Evaluation (Signed)
Anesthesia Post Note  [name]  Procedure(s) Performed: Right TOTAL KNEE ARTHROPLASTY (Right Knee)  Patient location during evaluation: Nursing Unit Anesthesia Type: Spinal Level of consciousness: oriented and awake and alert Pain management: pain level controlled Vital Signs Assessment: post-procedure vital signs reviewed and stable Respiratory status: spontaneous breathing and respiratory function stable Cardiovascular status: blood pressure returned to baseline and stable Postop Assessment: no headache, no backache, no apparent nausea or vomiting and patient able to bend at knees Anesthetic complications: no   No complications documented.   Last Vitals:  Vitals:   03/10/21 0424 03/10/21 0817  BP: 124/62 (!) 156/53  Pulse: 84 81  Resp: 17 15  Temp: 36.6 C 36.7 C  SpO2: 92% 91%    Last Pain:  Vitals:   03/10/21 0544  TempSrc:   PainSc: 5                  Alison Stalling

## 2021-03-10 NOTE — NC FL2 (Signed)
[place] LEVEL OF CARE SCREENING TOOL     IDENTIFICATION  Patient Name: Nancy Blackburn Birthdate: Jan 09, 1946 Sex: female Admission Date (Current Location): 03/09/2021  [place] and [place] Number:  Engineering geologist and Address:  [facility], [address], [place], [place] [address]      Provider Number: [id number]  Attending Physician Name and Address:  Thornton Park, MD  Relative Name and Phone Number:  Mylo Red (daughter) [phone]    Current Level of Care: Hospital Recommended Level of Care: [place] Prior Approval Number:    Date Approved/Denied:   PASRR Number: [id number]  Discharge Plan: SNF    Current Diagnoses: Patient Active Problem List   Diagnosis Date Noted  . S/P TKR (total knee replacement) using cement, right 03/09/2021  . Bradycardia 07/03/2020  . Postural dizziness with presyncope 07/01/2020  . Family history of colon cancer in mother 02/22/2018  . Cervical spondylosis 05/04/2017  . Cervical radiculopathy 05/04/2017  . Family history of Cushing disease 01/12/2017  . B12 deficiency 12/18/2016  . Mild cognitive impairment with memory loss 12/18/2016  . Parkinsonian features 12/18/2016  . Vitamin D deficiency 12/18/2016  . Chalazion, bilateral 06/08/2016  . Absolute anemia 07/30/2015  . Edema extremities 07/30/2015  . Benign essential tremor 07/30/2015  . Anxiety, generalized 07/30/2015  . Gastro-esophageal reflux disease without esophagitis 07/30/2015  . HLD (hyperlipidemia) 07/30/2015  . Cannot sleep 07/30/2015  . Lumbar radiculopathy 07/30/2015  . Depression, major, recurrent, in partial remission ([place]) 07/30/2015  . Arthritis, degenerative 07/30/2015  . Allergic rhinitis 07/30/2015  . Gastroduodenal ulcer 07/30/2015  . Calcium blood increased 07/30/2015  . Pre-syncope 05/17/2015  . Dyspnea 03/31/2015  . Bleeding stomach ulcer 10/14/2013  . Anemia, iron deficiency  10/14/2013  . Weakness generalized 10/14/2013  . Ejection fraction   . Pericardial effusion   . Palpitations   . Low back pain   . Normal nuclear stress test     Orientation RESPIRATION BLADDER Height & Weight     Self,Time,Situation,Place  Normal Continent Weight: 82.1 kg Height:  5\' 7"  (170.2 cm)  BEHAVIORAL SYMPTOMS/MOOD NEUROLOGICAL BOWEL NUTRITION STATUS      Continent Diet (Regular diet)  AMBULATORY STATUS COMMUNICATION OF NEEDS Skin   Extensive Assist Verbally Surgical wounds (right knee)                       Personal Care Assistance Level of Assistance  Bathing,Feeding,Dressing Bathing Assistance: Limited assistance Feeding assistance: Limited assistance Dressing Assistance: Limited assistance     Functional Limitations Info             SPECIAL CARE FACTORS FREQUENCY  PT (By licensed PT),OT (By licensed OT)     PT Frequency: 5 times per week OT Frequency: 5 times per week            Contractures Contractures Info: Not present    Additional Factors Info  Code Status,Allergies Code Status Info: Full Allergies Info: Anti inflammatory enzyme, cymbalta, clarithromycin, donepezil, NSAIDs, ranitidine, venlafaxine           Current Medications (03/10/2021):  This is the current hospital active medication list Current Facility-Administered Medications  Medication Dose Route Frequency Provider Last Rate Last Admin  . 0.9 %  sodium chloride infusion   Intravenous Continuous Thornton Park, MD   Stopped at 03/09/21 1811  . acetaminophen (TYLENOL) tablet 325-650 mg  325-650 mg Oral Q6H PRN Thornton Park, MD      .  alum & mag hydroxide-simeth (MAALOX/MYLANTA) 200-200-20 MG/5ML suspension 30 mL  30 mL Oral Q4H PRN Thornton Park, MD      . amLODipine (NORVASC) tablet 5 mg  5 mg Oral Daily Thornton Park, MD   5 mg at 03/10/21 0907  . bisacodyl (DULCOLAX) suppository 10 mg  10 mg Rectal Daily PRN Thornton Park, MD      . busPIRone (BUSPAR)  tablet 10 mg  10 mg Oral BID Thornton Park, MD   10 mg at 03/10/21 0908  . calcium-vitamin D (OSCAL WITH D) 500-200 MG-UNIT per tablet 1 tablet  1 tablet Oral Daily Thornton Park, MD   1 tablet at 03/10/21 0907  . citalopram (CELEXA) tablet 40 mg  40 mg Oral Daily Thornton Park, MD   40 mg at 03/10/21 1119  . docusate sodium (COLACE) capsule 100 mg  100 mg Oral BID Thornton Park, MD   100 mg at 03/10/21 0175  . enoxaparin (LOVENOX) injection 40 mg  40 mg Subcutaneous Q24H Thornton Park, MD   40 mg at 03/10/21 0909  . HYDROmorphone (DILAUDID) injection 0.5 mg  0.5 mg Intravenous Q4H PRN Thornton Park, MD   0.5 mg at 03/10/21 1112  . loratadine (CLARITIN) tablet 10 mg  10 mg Oral Daily Thornton Park, MD   10 mg at 03/10/21 1025  . methocarbamol (ROBAXIN) tablet 500 mg  500 mg Oral Q6H PRN Thornton Park, MD   500 mg at 03/10/21 1112   Or  . methocarbamol (ROBAXIN) 500 mg in dextrose 5 % 50 mL IVPB  500 mg Intravenous Q6H PRN Thornton Park, MD      . ondansetron [facility]) tablet 4 mg  4 mg Oral Q6H PRN Thornton Park, MD       Or  . ondansetron [facility]) injection 4 mg  4 mg Intravenous Q6H PRN Thornton Park, MD      . oxyCODONE (Oxy IR/ROXICODONE) immediate release tablet 10-15 mg  10-15 mg Oral Q4H PRN Thornton Park, MD   15 mg at 03/10/21 1309  . oxyCODONE (Oxy IR/ROXICODONE) immediate release tablet 5-10 mg  5-10 mg Oral Q4H PRN Thornton Park, MD   10 mg at 03/09/21 2101  . pantoprazole (PROTONIX) EC tablet 40 mg  40 mg Oral Daily Thornton Park, MD   40 mg at 03/10/21 0907  . primidone (MYSOLINE) tablet 50 mg  50 mg Oral QHS Thornton Park, MD   50 mg at 03/09/21 2229  . rosuvastatin (CRESTOR) tablet 10 mg  10 mg Oral Daily Thornton Park, MD   10 mg at 03/10/21 0908  . senna-docusate (Senokot-S) tablet 1 tablet  1 tablet Oral QHS PRN Thornton Park, MD      . traMADol [facility]at[facility]bon) tablet 50 mg  50 mg Oral Q6H Thornton Park, MD   50 mg at 03/10/21 1112   . traZODone (DESYREL) tablet 100 mg  100 mg Oral QHS Thornton Park, MD   100 mg at 03/09/21 2101  . vitamin B-12 (CYANOCOBALAMIN) tablet 1,000 mcg  1,000 mcg Oral Daily Thornton Park, MD   1,000 mcg at 03/10/21 0909     Discharge Medications: Please see discharge summary for a list of discharge medications.  Relevant Imaging Results:  Relevant Lab Results:   Additional Information SS# [id number]  Shelbie Hutching, RN

## 2021-03-10 NOTE — Progress Notes (Signed)
  Subjective:  POD #1 s/p right total knee arthroplasty.   Patient reports right knee pain as mild to moderate.  Patient states pain in her right knee is better controlled now.  She had more significant pain overnight.  Patient states she was unable to get up in the bed to the chair this morning with physical therapy.  Objective:   VITALS:   Vitals:   03/10/21 0011 03/10/21 0031 03/10/21 0424 03/10/21 0817  BP: 137/61 138/85 124/62 (!) 156/53  Pulse: 76 73 84 81  Resp: 16 18 17 15   Temp: 97.6 F (36.4 C)  97.8 F (36.6 C) 98.1 F (36.7 C)  TempSrc:      SpO2: 90% 99% 92% 91%  Weight:      Height:        PHYSICAL EXAM: Right lower extremity: Neurovascular intact Sensation intact distally Intact pulses distally Dorsiflexion/Plantar flexion intact Incision: dressing C/D/I No cellulitis present Compartment soft  LABS  Results for orders placed or performed during the hospital encounter of 03/09/21 (from the past 24 hour(s))  CBC     Status: None   Collection Time: 03/10/21  4:48 AM  Result Value Ref Range   WBC 9.5 4.0 - 10.5 K/uL   RBC 4.35 3.87 - 5.11 MIL/uL   Hemoglobin 12.8 12.0 - 15.0 g/dL   HCT 39.1 36.0 - 46.0 %   MCV 89.9 80.0 - 100.0 fL   MCH 29.4 26.0 - 34.0 pg   MCHC 32.7 30.0 - 36.0 g/dL   RDW 12.3 11.5 - 15.5 %   Platelets 207 150 - 400 K/uL   nRBC 0.0 0.0 - 0.2 %  Basic metabolic panel     Status: Abnormal   Collection Time: 03/10/21  4:48 AM  Result Value Ref Range   Sodium 134 (L) 135 - 145 mmol/L   Potassium 4.2 3.5 - 5.1 mmol/L   Chloride 102 98 - 111 mmol/L   CO2 27 22 - 32 mmol/L   Glucose, Bld 128 (H) 70 - 99 mg/dL   BUN 13 8 - 23 mg/dL   Creatinine, Ser 0.94 0.44 - 1.00 mg/dL   Calcium 8.8 (L) 8.9 - 10.3 mg/dL   GFR, Estimated >60 >60 mL/min   Anion gap 5 5 - 15    DG Knee Right Port  Result Date: 03/09/2021 CLINICAL DATA:  Patient status post right knee replacement today. EXAM: PORTABLE RIGHT KNEE - 1-2 VIEW COMPARISON:  None.  FINDINGS: Total arthroplasty is in place. No fracture is identified. Hardware appears normal. Gas in the soft tissues and surgical staples noted. IMPRESSION: Status post right knee replacement.  No acute abnormality. Electronically Signed   By: Inge Rise M.D.   On: 03/09/2021 12:18    Assessment/Plan: 1 Day Post-Op   Active Problems:   S/P TKR (total knee replacement) using cement, right  Continue elevation and Polar Care for the right knee to reduce swelling.  Continue physical therapy.  Patient will wear knee immobilizer while in bed.  Patient will have her dressing changed tomorrow.  Foley catheter has been removed.  Patient will need skilled nursing facility upon discharge.  Hemoglobin and hematocrit are stable.   Thornton Park , MD 03/10/2021, 1:47 PM

## 2021-03-10 NOTE — TOC Initial Note (Signed)
Transition of Care [facility]) - Initial/Assessment Note    Patient Details  Name: Nancy Blackburn MRN: [id number] Date of Birth: [birth year]  [facility] CM/SW Contact:    Shelbie Hutching, RN Phone Number: 03/10/2021, 3:12 PM  Clinical Narrative:                 Patient admitted to the hospital after total knee replacement.  RNCM spoke with patient via phone.  Patient is from home, her grandson lives with her but he is not able to help her at home because he is working most of the time.  Patient is independent at home, requires no assistive devices, and drives.  Patient is current with her PCP. PT is recommending SNF and patient agrees.  Patient has been to [facility] in the past.  Bed search started.  TOC team to review bed offers with patient.   Expected Discharge Plan: Skilled Nursing Facility Barriers to Discharge: Continued Medical Work up   Patient Goals and CMS Choice Patient states their goals for this hospitalization and ongoing recovery are:: Agrees to SNF for rehab CMS Medicare.gov Compare Post Acute Care list provided to:: Patient Choice offered to / list presented to : Patient  Expected Discharge Plan and Services Expected Discharge Plan: [place]   Discharge Planning Services: CM Consult Post Acute Care Choice: [place] Living arrangements for the past 2 months: [place]                 DME Arranged: N/A DME Agency: NA       HH Arranged: NA          Prior Living Arrangements/Services Living arrangements for the past 2 months: [place] Lives with:: Relatives Biomedical scientist) Patient language and need for interpreter reviewed:: Yes Do you feel safe going back to the place where you live?: Yes      Need for Family Participation in Patient Care: Yes (Comment) (Total knee) Care giver support system in place?: Yes (comment) (grandson)   Criminal Activity/Legal Involvement Pertinent to  Current Situation/Hospitalization: No - Comment as needed  Activities of Daily Living Home Assistive Devices/Equipment: Dentures (specify type) (lower partial) ADL Screening (condition at time of admission) Patient's cognitive ability adequate to safely complete daily activities?: Yes Is the patient deaf or have difficulty hearing?: No Does the patient have difficulty seeing, even when wearing glasses/contacts?: No Does the patient have difficulty concentrating, remembering, or making decisions?: No Patient able to express need for assistance with ADLs?: Yes Does the patient have difficulty dressing or bathing?: No Independently performs ADLs?: Yes (appropriate for developmental age) Does the patient have difficulty walking or climbing stairs?: No Weakness of Legs: None Weakness of Arms/Hands: None  Permission Sought/Granted Permission sought to share information with : Case Manager Permission granted to share information with : Yes, Verbal Permission Granted     Permission granted to share info w AGENCY: SNF's        Emotional Assessment   Attitude/Demeanor/Rapport: Engaged Affect (typically observed): Accepting Orientation: : Oriented to Self,Oriented to Place,Oriented to  Time,Oriented to Situation Alcohol / Substance Use: Not Applicable Psych Involvement: No (comment)  Admission diagnosis:  S/P TKR (total knee replacement) using cement, right [Z96.651] Patient Active Problem List   Diagnosis Date Noted  . S/P TKR (total knee replacement) using cement, right 03/09/2021  . Bradycardia 07/03/2020  . Postural dizziness with presyncope 07/01/2020  . Family history of colon cancer in mother 02/22/2018  .  Cervical spondylosis 05/04/2017  . Cervical radiculopathy 05/04/2017  . Family history of [place] disease 01/12/2017  . B12 deficiency 12/18/2016  . Mild cognitive impairment with memory loss 12/18/2016  . Parkinsonian features 12/18/2016  . Vitamin D deficiency 12/18/2016   . Chalazion, bilateral 06/08/2016  . Absolute anemia 07/30/2015  . Edema extremities 07/30/2015  . Benign essential tremor 07/30/2015  . Anxiety, generalized 07/30/2015  . Gastro-esophageal reflux disease without esophagitis 07/30/2015  . HLD (hyperlipidemia) 07/30/2015  . Cannot sleep 07/30/2015  . Lumbar radiculopathy 07/30/2015  . Depression, major, recurrent, in partial remission ([place]) 07/30/2015  . Arthritis, degenerative 07/30/2015  . Allergic rhinitis 07/30/2015  . Gastroduodenal ulcer 07/30/2015  . Calcium blood increased 07/30/2015  . Pre-syncope 05/17/2015  . Dyspnea 03/31/2015  . Bleeding stomach ulcer 10/14/2013  . Anemia, iron deficiency 10/14/2013  . Weakness generalized 10/14/2013  . Ejection fraction   . Pericardial effusion   . Palpitations   . Low back pain   . Normal nuclear stress test    PCP:  Jerrol Banana., MD Pharmacy:   [facility] [address] [place] [address] Phone: [phone] Fax: [facility] [place], [address] [place] [address] Phone: [phone] Fax: [phone]     Social Determinants of Health (SDOH) Interventions    Readmission Risk Interventions No flowsheet data found.

## 2021-03-10 NOTE — Progress Notes (Signed)
Physical Therapy Treatment Patient Details Name: Nancy Blackburn MRN: [id number] DOB: [birth year] Today's Date: 03/10/2021    History of Present Illness Patient is a 75 year old female with right knee osteoarthritis who elected for total right knee arthroplasty. Past medical history positive for shortness of breath, asthma, anxiety, depression, GERD, hypertension, bradycardia, cervical cancer, Parkinsons disease, fracture of sacrum/coccyx, mild dementia, back surgery.    PT Comments    Pt received pain meds prior to pm session.  R knee pain at rest 4/10 during session increased to 9/10.  Pt able to transition supine to EOB with MinA for R LE management, sitting EOB x 15+ minutes with multiple attempts to transfer to standing at RW.  Pt unable to attain despite MaxA and verbal/visual cues given. Pt afraid of placing weight through R LE and pain increasing.  Pt appears self limiting and will benefit from continued PT at SNF prior to d/c home.  Pt's R knee AAROM remains at 2-75   Follow Up Recommendations  SNF     Equipment Recommendations  Rolling walker with 5" wheels    Recommendations for Other Services       Precautions / Restrictions Precautions Precautions: Knee Precaution Booklet Issued: Yes (comment) Required Braces or Orthoses: Knee Immobilizer - Right Knee Immobilizer - Right:  (off for PT) Restrictions Weight Bearing Restrictions: Yes RLE Weight Bearing: Weight bearing as tolerated    Mobility  Bed Mobility Overal bed mobility: Needs Assistance Bed Mobility: Supine to Sit;Sit to Supine     Supine to sit: Min guard;HOB elevated Sit to supine: Min assist;HOB elevated   General bed mobility comments: verbal cues for technique and sequencing. assistance for LLE required for return to bed. patient able to long sit independently    Transfers Overall transfer level: Needs assistance Equipment used: Rolling walker (2 wheeled) Transfers: Sit to/from Stand Sit to  Stand: Max assist (x 4 attempts from raised be)         General transfer comment: Pt very anxious, unable to attain standing this pm due to fear of putting weight through R LE and pain increasing, pt appears self limiting.  Ambulation/Gait                 Stairs             Wheelchair Mobility    Modified Rankin (Stroke Patients Only)       Balance                                            Cognition Arousal/Alertness: Awake/alert Behavior During Therapy: Anxious Overall Cognitive Status: Within Functional Limits for tasks assessed                                        Exercises      General Comments        Pertinent Vitals/Pain Pain Assessment: 0-10 Pain Score: 9  Pain Location: right knee Pain Descriptors / Indicators: Operative site guarding;Guarding;Aching Pain Intervention(s): Limited activity within patient's tolerance;Premedicated before session;Utilized relaxation techniques    Home Living                      Prior Function            PT  Goals (current goals can now be found in the care plan section) Acute Rehab PT Goals Patient Stated Goal: to go to rehab at discharge    Frequency    BID      PT Plan Current plan remains appropriate    Co-evaluation              AM-PAC PT "6 Clicks" Mobility   Outcome Measure  Help needed turning from your back to your side while in a flat bed without using bedrails?: A Little Help needed moving from lying on your back to sitting on the side of a flat bed without using bedrails?: A Little Help needed moving to and from a bed to a chair (including a wheelchair)?: A Little Help needed standing up from a chair using your arms (e.g., wheelchair or bedside chair)?: A Little Help needed to walk in hospital room?: A Little Help needed climbing 3-5 steps with a railing? : A Little 6 Click Score: 18    End of Session Equipment Utilized During  Treatment: Gait belt Activity Tolerance: Patient limited by pain Patient left: in bed;with call bell/phone within reach;with bed alarm set;with SCD's reapplied Nurse Communication: Mobility status PT Visit Diagnosis: Pain;Muscle weakness (generalized) (M62.81);Difficulty in walking, not elsewhere classified (R26.2) Pain - Right/Left: Right Pain - part of body: Knee     Time: 1345-1415 PT Time Calculation (min) (ACUTE ONLY): 30 min  Charges:  $Therapeutic Activity: 23-37 mins                     Nancy Blackburn, PTA   Nancy Blackburn 03/10/2021, 2:39 PM

## 2021-03-10 NOTE — Evaluation (Signed)
Occupational Therapy Evaluation Patient Details Name: Nancy Blackburn MRN: [id number] DOB: [birth year] Today's Date: 03/10/2021    History of Present Illness Patient is a 75 year old female with right knee osteoarthritis who elected for total right knee arthroplasty. Past medical history positive for shortness of breath, asthma, anxiety, depression, GERD, hypertension, bradycardia, cervical cancer, Parkinsons disease, fracture of sacrum/coccyx, mild dementia, back surgery.   Clinical Impression   Pt seen for OT evaluation this date, POD#1 from above surgery. Pt was independent in all ADL prior to surgery and is eager to return to PLOF with less pain and improved safety and independence. Pt currently requires MOD A for LB dressing and bathing while in seated position due to significant pain and limited AROM of R knee as well as MAX A for compression stocking, polar care, and KI mgt. Friend present for session. Pt/friend instructed in polar care mgt, falls prevention strategies, home/routines modifications, DME/AE for LB bathing and dressing tasks, pet care considerations, KI mgt, and compression stocking mgt. Handout provided to support recall and carryover. Pt reports that her son (who works during the day) would be able to help with caring for her cat as well as polar care and compression stocking mgt. Pt would benefit from additional skilled OT services including additional instruction in dressing techniques with or without assistive devices for dressing and bathing skills to support recall and carryover prior to discharge and ultimately to maximize safety, independence, and minimize falls risk and caregiver burden. Pt currently not at baseline for ADL/mobility, recommending short term rehab to support return to PLOF and minimize risk of falls.       Follow Up Recommendations  SNF    Equipment Recommendations  3 in 1 bedside commode    Recommendations for Other Services       Precautions  / Restrictions Precautions Precautions: Knee Precaution Booklet Issued: Yes (comment) Required Braces or Orthoses: Knee Immobilizer - Right Knee Immobilizer - Right: Other (comment) (off for PT) Restrictions Weight Bearing Restrictions: Yes RLE Weight Bearing: Partial weight bearing RLE Partial Weight Bearing Percentage or Pounds: 50%      Mobility Bed Mobility Overal bed mobility: Needs Assistance Bed Mobility: Supine to Sit;Sit to Supine     Supine to sit: Min guard;HOB elevated Sit to supine: Min assist;HOB elevated   General bed mobility comments: deferred 2/2 pain per pt    Transfers Overall transfer level: Needs assistance Equipment used: Rolling walker (2 wheeled) Transfers: Sit to/from Stand Sit to Stand: Max assist (x 4 attempts from raised be)         General transfer comment: deferred 2/2 pain per pt    Balance                                           ADL either performed or assessed with clinical judgement   ADL Overall ADL's : Needs assistance/impaired                                       General ADL Comments: Pt currently requires MOD A for LB ADL tasks, MOD A for ADL transfers, pain limited, and MAX A for polar care and compression stocking mgt     Vision Baseline Vision/History: Wears glasses Wears Glasses: Reading only Patient Visual Report: No change  from baseline       Perception     Praxis      Pertinent Vitals/Pain Pain Assessment: 0-10 Pain Score: 6  Pain Location: right knee Pain Descriptors / Indicators: Operative site guarding;Guarding;Aching Pain Intervention(s): Limited activity within patient's tolerance;Monitored during session;Premedicated before session;Repositioned;Ice applied     Hand Dominance     Extremity/Trunk Assessment Upper Extremity Assessment Upper Extremity Assessment: Overall WFL for tasks assessed   Lower Extremity Assessment Lower Extremity Assessment: RLE  deficits/detail RLE Deficits / Details: pain limited, s/p R TKA RLE Sensation: WNL       Communication Communication Communication: No difficulties   Cognition Arousal/Alertness: Awake/alert;Suspect due to medications (groggy, drowsy) Behavior During Therapy: Flat affect Overall Cognitive Status: Within Functional Limits for tasks assessed                                 General Comments: groggy/drowsy   General Comments       Exercises Other Exercises Other Exercises: Pt and friend present educated in falls prevention, home/routines modifications, pet care considerations, compression stocking mgt, polar care mgt, and KI mgt. Handout provided to support recall and carryover.   Shoulder Instructions      Home Living Family/patient expects to be discharged to:: Private residence Living Arrangements: Other (Comment) (patient reports her grandson lives with her but he works) Available Help at Discharge: Friend(s);Available PRN/intermittently Type of Home: House Home Access: Stairs to enter [employer] of Steps: 6 (6 steps for the front and 3 steps in the back) Entrance Stairs-Rails: Right;Left Home Layout: One level     Bathroom Shower/Tub: Teacher, early years/pre: Standard (comfort height but not handicap height)     Home Equipment: Grab bars - tub/shower          Prior Functioning/Environment Level of Independence: Independent                 OT Problem List: Decreased strength;Pain;Decreased range of motion;Decreased activity tolerance;Impaired balance (sitting and/or standing);Decreased knowledge of use of DME or AE;Decreased knowledge of precautions      OT Treatment/Interventions: Self-care/ADL training;Therapeutic exercise;Therapeutic activities;DME and/or AE instruction;Patient/family education;Balance training    OT Goals(Current goals can be found in the care plan section) Acute Rehab OT Goals Patient Stated Goal:  to go to rehab at discharge OT Goal Formulation: With patient Time For Goal Achievement: 03/24/21 Potential to Achieve Goals: Good ADL Goals Pt Will Perform Lower Body Dressing: with min assist;sit to/from stand Pt Will Transfer to Toilet: with min guard assist;ambulating (BSC over toilet, LRAD for amb, KI donned) Additional ADL Goal #1: Pt will independently instruct family/caregiver in polar care mgt Additional ADL Goal #2: Pt will independently instruct family/caregiver in compression stocking mgt Additional ADL Goal #3: Pt will independently instruct family/caregiver in knee immobilizer mgt  OT Frequency: Min 2X/week   Barriers to D/C:            Co-evaluation              AM-PAC OT "6 Clicks" Daily Activity     Outcome Measure Help from another person eating meals?: None Help from another person taking care of personal grooming?: None Help from another person toileting, which includes using toliet, bedpan, or urinal?: A Lot Help from another person bathing (including washing, rinsing, drying)?: A Lot Help from another person to put on and taking off regular upper body clothing?: A Little Help  from another person to put on and taking off regular lower body clothing?: A Lot 6 Click Score: 17   End of Session    Activity Tolerance: Patient limited by pain Patient left: in bed;with call bell/phone within reach;with bed alarm set;with SCD's reapplied;Other (comment) (KI and polar care in place)  OT Visit Diagnosis: Other abnormalities of gait and mobility (R26.89);Pain Pain - Right/Left: Right Pain - part of body: Knee                Time: 1421-1435 OT Time Calculation (min): 14 min Charges:  OT General Charges $OT Visit: 1 Visit OT Evaluation $OT Eval Low Complexity: 1 Low OT Treatments $Self Care/Home Management : 8-22 mins  Hanley Hays, MPH, MS, OTR/L ascom [phone] 03/10/21, 3:22 PM

## 2021-03-11 LAB — CBC
HCT: 38.1 % (ref 36.0–46.0)
Hemoglobin: 12.4 g/dL (ref 12.0–15.0)
MCH: 29 pg (ref 26.0–34.0)
MCHC: 32.5 g/dL (ref 30.0–36.0)
MCV: 89 fL (ref 80.0–100.0)
Platelets: 218 10*3/uL (ref 150–400)
RBC: 4.28 MIL/uL (ref 3.87–5.11)
RDW: 12.5 % (ref 11.5–15.5)
WBC: 10.2 10*3/uL (ref 4.0–10.5)
nRBC: 0 % (ref 0.0–0.2)

## 2021-03-11 MED ORDER — ENOXAPARIN SODIUM 40 MG/0.4ML ~~LOC~~ SOLN: 40.0000 mg | SUBCUTANEOUS | 0 refills | Status: DC

## 2021-03-11 MED ORDER — OXYCODONE HCL 5 MG PO TABS: 5.0000 mg | ORAL_TABLET | ORAL | 0 refills | Status: DC | PRN

## 2021-03-11 MED ORDER — DOCUSATE SODIUM 100 MG PO CAPS: 100.0000 mg | ORAL_CAPSULE | Freq: Two times a day (BID) | ORAL | 0 refills | Status: DC

## 2021-03-11 MED ORDER — ACETAMINOPHEN 325 MG PO TABS: 325.0000 mg | ORAL_TABLET | Freq: Four times a day (QID) | ORAL | 1 refills | Status: DC | PRN

## 2021-03-11 NOTE — Progress Notes (Addendum)
  Subjective:  POD #2 s/p right total knee arthroplasty.   Patient reports right knee pain as moderate after physical and occupational therapy today.  Patient had one episode of decreased O2 saturation to 88% this morning.  Patient is currently on 2 L nasal cannula.  Objective:   VITALS:   Vitals:   03/11/21 0812 03/11/21 1010 03/11/21 1040 03/11/21 1532  BP: (!) 149/64   (!) 165/85  Pulse: 95   83  Resp: 18   18  Temp: 98.4 F (36.9 C)   98.2 F (36.8 C)  TempSrc: Oral     SpO2: 90% 90% (!) 88% 99%  Weight:      Height:        PHYSICAL EXAM: Right lower extremity: I personally reviewed the patient's outer dressings today.  The Aquasol dressing was left in place.. Neurovascular intact Sensation intact distally Intact pulses distally Dorsiflexion/Plantar flexion intact Aquacel dressing: Mild serosanguinous drainage. No cellulitis present Compartment soft  LABS  Results for orders placed or performed during the hospital encounter of 03/09/21 (from the past 24 hour(s))  CBC     Status: None   Collection Time: 03/11/21  5:53 AM  Result Value Ref Range   WBC 10.2 4.0 - 10.5 K/uL   RBC 4.28 3.87 - 5.11 MIL/uL   Hemoglobin 12.4 12.0 - 15.0 g/dL   HCT 38.1 36.0 - 46.0 %   MCV 89.0 80.0 - 100.0 fL   MCH 29.0 26.0 - 34.0 pg   MCHC 32.5 30.0 - 36.0 g/dL   RDW 12.5 11.5 - 15.5 %   Platelets 218 150 - 400 K/uL   nRBC 0.0 0.0 - 0.2 %    No results found.  Assessment/Plan: 2 Days Post-Op   Active Problems:   S/P TKR (total knee replacement) using cement, right  Patient making progress with physical therapy.  Continue elevation and Polar Care.  Patient will have a Covid test tonight and has a [facility].  Continue to monitor patient's O2 saturation.  Encourage incentive spirometry.  Continue Lovenox for DVT prophylaxis.    Thornton Park , MD 03/11/2021, 5:07 PM

## 2021-03-11 NOTE — Progress Notes (Signed)
Physical Therapy Treatment Patient Details Name: Nancy Blackburn MRN: [id number] DOB: [birth year] Today's Date: 03/11/2021    History of Present Illness Patient is a 75 year old female with right knee osteoarthritis who elected for total right knee arthroplasty. Past medical history positive for shortness of breath, asthma, anxiety, depression, GERD, hypertension, bradycardia, cervical cancer, Parkinsons disease, fracture of sacrum/coccyx, mild dementia, back surgery.    PT Comments    PT returning for BID treatment; earlier attempt refused as pt asked to rest if possible as she recently finished with transfer to [facility] then back to be wth NA. Pt recently finished with toilet transfers with OT more recently, hence continued to focus on severe difficulty with quads activation. Pt assists with SAQ in bed 2 sets Right, 3 sets Left, pain remains a large factor, pt requires >50% assist to lift due to pain. Pt more sleepy, falling asleep  Mid set a few times. Pain remains well managed. Reviewed handout once again, left pt in KI, SCD, polarcare, heel elevated.    Follow Up Recommendations  SNF     Equipment Recommendations  Rolling walker with 5" wheels    Recommendations for Other Services       Precautions / Restrictions Precautions Precautions: Knee Precaution Booklet Issued: Yes (comment) Required Braces or Orthoses: Knee Immobilizer - Right Knee Immobilizer - Right: Other (comment) Restrictions Weight Bearing Restrictions: Yes RLE Weight Bearing: Partial weight bearing RLE Partial Weight Bearing Percentage or Pounds: 50%    Mobility  Bed Mobility Overal bed mobility:  (deferred, pt recently finishing c transfers c RN and later, OT) Bed Mobility: Supine to Sit;Sit to Supine     Supine to sit: Min assist;HOB elevated Sit to supine: Min assist;HOB elevated   General bed mobility comments: MIN A for RLE mgt    Transfers Overall transfer level: Needs assistance Equipment  used: Rolling walker (2 wheeled) Transfers: Sit to/from Stand Sit to Stand: Max assist;From elevated surface         General transfer comment: elevated bed significantly, cues for hands and L foot placement, MAX A  Ambulation/Gait                 Stairs             Wheelchair Mobility    Modified Rankin (Stroke Patients Only)       Balance Overall balance assessment: Needs assistance Sitting-balance support: Feet supported;No upper extremity supported Sitting balance-Leahy Scale: Good     Standing balance support: Bilateral upper extremity supported;During functional activity Standing balance-Leahy Scale: Poor Standing balance comment: pt heavily dependent on RW for BUE support                            Cognition Arousal/Alertness: Awake/alert Behavior During Therapy: WFL for tasks assessed/performed Overall Cognitive Status: Within Functional Limits for tasks assessed                                        Exercises Other Exercises Other Exercises: SAQ LLE 3x15x3secH Other Exercises: SAQ RLE 2x10x3secH (AA/ROM, authror povides >50% of lift) Other Exercises: seated RLE knee flexion 1x10 (AR/ROM, manually resisted, more forcefull/less painful than quads)    General Comments General comments (skin integrity, edema, etc.): SpO2 97% on 1L O2 in room, removed for ADL/mobility, dropped to 89-90% on room air, [place] reapplied and  SpO2 improved to mig to high 90's      Pertinent Vitals/Pain Pain Assessment: No/denies pain Pain Score: 6  Pain Location: right knee with mobility/ADL Pain Descriptors / Indicators: Operative site guarding;Guarding;Aching Pain Intervention(s): Limited activity within patient's tolerance;Monitored during session;Premedicated before session;Repositioned;Ice applied    Home Living                      Prior Function            PT Goals (current goals can now be found in the care plan section)  Acute Rehab PT Goals Patient Stated Goal: to go to rehab at discharge PT Goal Formulation: With patient Time For Goal Achievement: 03/23/21 Potential to Achieve Goals: Good Additional Goals Additional Goal #1: patient will increase right knee flexion to 90 degrees for increased independence with functional mobility Progress towards PT goals: Progressing toward goals    Frequency    BID      PT Plan Current plan remains appropriate    Co-evaluation              AM-PAC PT "6 Clicks" Mobility   Outcome Measure  Help needed turning from your back to your side while in a flat bed without using bedrails?: A Little Help needed moving from lying on your back to sitting on the side of a flat bed without using bedrails?: A Little Help needed moving to and from a bed to a chair (including a wheelchair)?: A Little Help needed standing up from a chair using your arms (e.g., wheelchair or bedside chair)?: A Little Help needed to walk in hospital room?: A Little Help needed climbing 3-5 steps with a railing? : A Little 6 Click Score: 18    End of Session   Activity Tolerance: Patient limited by pain;Patient tolerated treatment well;Patient limited by fatigue Patient left: in bed;with call bell/phone within reach;with bed alarm set;with SCD's reapplied Nurse Communication: Mobility status PT Visit Diagnosis: Pain;Muscle weakness (generalized) (M62.81);Difficulty in walking, not elsewhere classified (R26.2) Pain - Right/Left: Right Pain - part of body: Knee     Time: 3428-7681 PT Time Calculation (min) (ACUTE ONLY): 26 min  Charges:  $Therapeutic Exercise: 23-37 mins                     4:17 PM, 03/11/21 Etta Grandchild, PT, DPT Physical Therapist - [facility]  [phone] ([place])    Joao Mccurdy C 03/11/2021, 4:14 PM

## 2021-03-11 NOTE — TOC Progression Note (Signed)
Transition of Care [facility]) - Progression Note    Patient Details  Name: Nancy Blackburn MRN: [id number] Date of Birth: [birth year]  Transition of Care [facility]) CM/SW [place], RN Phone Number: 03/11/2021, 1:22 PM  Clinical Narrative:   TOC in to see patient at bedside re: discharge planning.  Presented bed offers at [facility] and [facility].  Medicare ratings list provided.  After speaking with friend, patient chose [facility].  Tammy at [facility] notified.  Patient has no questions or concerns for TOC at present, TOC contact information given, TOC to follow through discharge.    Expected Discharge Plan: [place] Barriers to Discharge: Continued Medical Work up  Expected Discharge Plan and Services Expected Discharge Plan: [place]   Discharge Planning Services: CM Consult Post Acute Care Choice: [place] Living arrangements for the past 2 months: [place]                 DME Arranged: N/A DME Agency: NA       HH Arranged: NA           Social Determinants of Health (SDOH) Interventions    Readmission Risk Interventions No flowsheet data found.

## 2021-03-11 NOTE — Progress Notes (Signed)
Occupational Therapy Treatment Patient Details Name: Nancy Blackburn MRN: [id number] DOB: Mar 27, 1946 Today's Date: 03/11/2021    History of present illness Patient is a 75 year old female with right knee osteoarthritis who elected for total right knee arthroplasty. Past medical history positive for shortness of breath, asthma, anxiety, depression, GERD, hypertension, bradycardia, cervical cancer, Parkinsons disease, fracture of sacrum/coccyx, mild dementia, back surgery.   OT comments  Pt seen for OT tx this date. Pt endorses feeling better today, pain more under control. Pt performed bed mobility with MIN A for RLE mgt. Pt instructed in ADL transfers with RW mgt requiring cues for hand and L foot placement to improve technique without success on 1st attempt and improved to full standing on 2nd attempt but requiring MAX A to complete. Pt able to maintain PWBing to RLE while getting to [facility] ~65ft away requiring VC for RW mgt. Pt required total assist for pericare after toileting 2/2 impaired balance in standing requiring heavy BUE support on RW to maintain. Pt demonstrates progress towards goals this date, however, continues to benefit from skilled OT services to maximize return to PLOF.    Follow Up Recommendations  SNF    Equipment Recommendations  3 in 1 bedside commode    Recommendations for Other Services      Precautions / Restrictions Precautions Precautions: Knee Precaution Booklet Issued: Yes (comment) Required Braces or Orthoses: Knee Immobilizer - Right Knee Immobilizer - Right: Other (comment) (off for PT) Restrictions Weight Bearing Restrictions: Yes RLE Weight Bearing: Partial weight bearing RLE Partial Weight Bearing Percentage or Pounds: 50%       Mobility Bed Mobility Overal bed mobility: Needs Assistance Bed Mobility: Supine to Sit;Sit to Supine     Supine to sit: Min assist;HOB elevated Sit to supine: Min assist;HOB elevated   General bed mobility  comments: MIN A for RLE mgt    Transfers Overall transfer level: Needs assistance Equipment used: Rolling walker (2 wheeled) Transfers: Sit to/from Stand Sit to Stand: Max assist;From elevated surface         General transfer comment: elevated bed significantly, cues for hands and L foot placement, MAX A    Balance Overall balance assessment: Needs assistance Sitting-balance support: Feet supported;No upper extremity supported Sitting balance-Leahy Scale: Good     Standing balance support: Bilateral upper extremity supported;During functional activity Standing balance-Leahy Scale: Poor Standing balance comment: pt heavily dependent on RW for BUE support                           ADL either performed or assessed with clinical judgement   ADL Overall ADL's : Needs assistance/impaired                         Toilet Transfer: RW;Ambulation;BSC;Minimal assistance Toilet Transfer Details (indicate cue type and reason): cues for hand and L foot placement Toileting- Clothing Manipulation and Hygiene: Total assistance;Sit to/from stand Toileting - Clothing Manipulation Details (indicate cue type and reason): pt unable to remove BUE support from [employer] [employer]/History: Wears glasses Wears Glasses: Reading only Patient Visual Report: No change from baseline     Perception     Praxis      Cognition Arousal/Alertness: Awake/alert Behavior During Therapy: WFL for tasks assessed/performed Overall Cognitive Status: Within Functional Limits for tasks assessed  Exercises Other Exercises Other Exercises: Pt instructed in bed mobility, RW mgt, toilet transfer to [facility], pain mgt   Shoulder Instructions       General Comments SpO2 97% on 1L O2 in room, removed for ADL/mobility, dropped to 89-90% on room air, [place] reapplied and SpO2 improved to mig to high 90's    Pertinent  Vitals/ Pain       Pain Assessment: 0-10 Pain Score: 6  Pain Location: right knee with mobility/ADL Pain Descriptors / Indicators: Operative site guarding;Guarding;Aching Pain Intervention(s): Limited activity within patient's tolerance;Monitored during session;Premedicated before session;Repositioned;Ice applied  Home Living                                          Prior Functioning/Environment              Frequency  Min 2X/week        Progress Toward Goals  OT Goals(current goals can now be found in the care plan section)  Progress towards OT goals: Progressing toward goals  Acute Rehab OT Goals Patient Stated Goal: to go to rehab at discharge OT Goal Formulation: With patient Time For Goal Achievement: 03/24/21 Potential to Achieve Goals: Good  Plan Discharge plan remains appropriate;Frequency remains appropriate    Co-evaluation                 AM-PAC OT "6 Clicks" Daily Activity     Outcome Measure   Help from another person eating meals?: None Help from another person taking care of personal grooming?: None Help from another person toileting, which includes using toliet, bedpan, or urinal?: A Lot Help from another person bathing (including washing, rinsing, drying)?: A Lot Help from another person to put on and taking off regular upper body clothing?: A Little Help from another person to put on and taking off regular lower body clothing?: A Lot 6 Click Score: 17    End of Session Equipment Utilized During Treatment: Gait belt;Rolling walker  OT Visit Diagnosis: Other abnormalities of gait and mobility (R26.89);Pain Pain - Right/Left: Right Pain - part of body: Knee   Activity Tolerance Patient tolerated treatment well   Patient Left in bed;with call bell/phone within reach;with bed alarm set;with SCD's reapplied;Other (comment) (polar care and KI in place)   Nurse Communication          Time: [id number] OT Time  Calculation (min): 29 min  Charges: OT General Charges $OT Visit: 1 Visit OT Treatments $Self Care/Home Management : 23-37 mins  Hanley Hays, MPH, MS, OTR/L ascom [phone] 03/11/21, 2:43 PM

## 2021-03-11 NOTE — Progress Notes (Signed)
Physical Therapy Treatment Patient Details Name: Nancy Blackburn MRN: [id number] DOB: May 25, 1946 Today's Date: 03/11/2021    History of Present Illness Patient is a 75 year old female with right knee osteoarthritis who elected for total right knee arthroplasty. Past medical history positive for shortness of breath, asthma, anxiety, depression, GERD, hypertension, bradycardia, cervical cancer, Parkinsons disease, fracture of sacrum/coccyx, mild dementia, back surgery.    PT Comments    Pt in bed upon entry, agreeable to session, reports much improved pain control this date. RLE remains very pain limited, poor quads activation. Supervision to EOB, minA STS from elevated surface both with dependent assist as well as RW. 2 minutes to pivot rear-end to recliner, then AMB toward FOB prior to onset fatigue. AMB requires heavy BUE support, poor weight acceptance to RLE in stance. PWB easily achieved due to pain limitations. Pt left up in chair at EOS, instructions for knee precautions but encouraged to take breaks from TKE stretch as needed, as not to exacerbate her pain further, as yesterday she could not progress to standing at all due to pain. Pt 88% SpO2 twice in session, moved to 1L O2 for sats, encouraged to use spirometer.    Follow Up Recommendations  SNF     Equipment Recommendations  Rolling walker with 5" wheels    Recommendations for Other Services       Precautions / Restrictions Precautions Precautions: Knee Precaution Booklet Issued: Yes (comment) Required Braces or Orthoses: Knee Immobilizer - Right Knee Immobilizer - Right: Other (comment) Restrictions Weight Bearing Restrictions: Yes RLE Weight Bearing: Partial weight bearing RLE Partial Weight Bearing Percentage or Pounds: 50%    Mobility  Bed Mobility Overal bed mobility: Needs Assistance Bed Mobility: Supine to Sit;Sit to Supine     Supine to sit: Supervision Sit to supine: Min assist;HOB elevated    General bed mobility comments: labored effort, no asssit needed, additional time    Transfers Overall transfer level: Needs assistance Equipment used: Rolling walker (2 wheeled);None Transfers: Sit to/from Stand Sit to Stand: Min assist;From elevated surface         General transfer comment: minA dependent transfer, then minA RW transfer.  Ambulation/Gait Ambulation/Gait assistance: Min guard Gait Distance (Feet): 6 Feet Assistive device: Rolling walker (2 wheeled) Gait Pattern/deviations: Step-to pattern     General Gait Details: very small, antalgic steps, chair follow, heavy BUE support on RW, no frank LOB   Stairs             Wheelchair Mobility    Modified Rankin (Stroke Patients Only)       Balance Overall balance assessment: Needs assistance Sitting-balance support: Feet supported;No upper extremity supported Sitting balance-Leahy Scale: Good     Standing balance support: Bilateral upper extremity supported;During functional activity Standing balance-Leahy Scale: Poor Standing balance comment: pt heavily dependent on RW for BUE support                            Cognition Arousal/Alertness: Awake/alert Behavior During Therapy: WFL for tasks assessed/performed Overall Cognitive Status: Within Functional Limits for tasks assessed                                        Exercises Total Joint Exercises Ankle Circles/Pumps: AROM;10 reps;Supine;15 reps;Both Short Arc Quad: AROM;15 reps;Supine;Limitations Short Arc Quad Limitations: requires heavy >50% physical assist to perform  due to pain Heel Slides: AAROM;10 reps;Supine;Both Hip ABduction/ADduction: AAROM;Both;10 reps;Supine Straight Leg Raises: AAROM;Supine;5 reps;Both Long Arc Quad: Right;5 reps;Seated;AAROM;Limitations Long Arc Quad Limitations: very limited by pain Goniometric ROM: 18-74 degrees     General Comments General comments (skin integrity, edema,  etc.): SpO2 97% on 1L O2 in room, removed for ADL/mobility, dropped to 89-90% on room air, [place] reapplied and SpO2 improved to mig to high 90's      Pertinent Vitals/Pain Pain Assessment: No/denies pain Pain Score: 6  Pain Location: right knee with mobility/ADL Pain Descriptors / Indicators: Operative site guarding;Guarding;Aching Pain Intervention(s): Limited activity within patient's tolerance;Monitored during session;Premedicated before session;Repositioned;Ice applied    Home Living                      Prior Function            PT Goals (current goals can now be found in the care plan section) Acute Rehab PT Goals Patient Stated Goal: to go to rehab at discharge PT Goal Formulation: With patient Time For Goal Achievement: 03/23/21 Potential to Achieve Goals: Good Additional Goals Additional Goal #1: patient will increase right knee flexion to 90 degrees for increased independence with functional mobility Progress towards PT goals: Progressing toward goals    Frequency    BID      PT Plan Current plan remains appropriate    Co-evaluation              AM-PAC PT "6 Clicks" Mobility   Outcome Measure  Help needed turning from your back to your side while in a flat bed without using bedrails?: A Little Help needed moving from lying on your back to sitting on the side of a flat bed without using bedrails?: A Little Help needed moving to and from a bed to a chair (including a wheelchair)?: A Lot Help needed standing up from a chair using your arms (e.g., wheelchair or bedside chair)?: A Lot Help needed to walk in hospital room?: Total Help needed climbing 3-5 steps with a railing? : Total 6 Click Score: 12    End of Session Equipment Utilized During Treatment: Gait belt;Right knee immobilizer Activity Tolerance: Patient limited by pain;Patient tolerated treatment well;Patient limited by fatigue Patient left: in bed;with call bell/phone within reach;with  bed alarm set;with SCD's reapplied Nurse Communication: Mobility status PT Visit Diagnosis: Pain;Muscle weakness (generalized) (M62.81);Difficulty in walking, not elsewhere classified (R26.2) Pain - Right/Left: Right Pain - part of body: Knee     Time: 1308-6578 PT Time Calculation (min) (ACUTE ONLY): 41 min  Charges:  $Gait Training: 8-22 mins $Therapeutic Exercise: 23-37 mins                     4:32 PM, 03/11/21 Etta Grandchild, PT, DPT Physical Therapist - [facility]  [phone] ([place])     [place] C 03/11/2021, 4:26 PM

## 2021-03-12 DIAGNOSIS — R279 Unspecified lack of coordination: Secondary | ICD-10-CM | POA: Diagnosis not present

## 2021-03-12 DIAGNOSIS — M150 Primary generalized (osteo)arthritis: Secondary | ICD-10-CM | POA: Diagnosis not present

## 2021-03-12 DIAGNOSIS — R2681 Unsteadiness on feet: Secondary | ICD-10-CM | POA: Diagnosis not present

## 2021-03-12 DIAGNOSIS — M170 Bilateral primary osteoarthritis of knee: Secondary | ICD-10-CM | POA: Diagnosis not present

## 2021-03-12 DIAGNOSIS — E559 Vitamin D deficiency, unspecified: Secondary | ICD-10-CM | POA: Diagnosis not present

## 2021-03-12 DIAGNOSIS — E785 Hyperlipidemia, unspecified: Secondary | ICD-10-CM | POA: Diagnosis not present

## 2021-03-12 DIAGNOSIS — M810 Age-related osteoporosis without current pathological fracture: Secondary | ICD-10-CM | POA: Diagnosis not present

## 2021-03-12 DIAGNOSIS — I10 Essential (primary) hypertension: Secondary | ICD-10-CM | POA: Diagnosis not present

## 2021-03-12 DIAGNOSIS — R5381 Other malaise: Secondary | ICD-10-CM | POA: Diagnosis not present

## 2021-03-12 DIAGNOSIS — Z4889 Encounter for other specified surgical aftercare: Secondary | ICD-10-CM | POA: Diagnosis not present

## 2021-03-12 DIAGNOSIS — F411 Generalized anxiety disorder: Secondary | ICD-10-CM | POA: Diagnosis not present

## 2021-03-12 DIAGNOSIS — M6281 Muscle weakness (generalized): Secondary | ICD-10-CM | POA: Diagnosis not present

## 2021-03-12 DIAGNOSIS — G20 Parkinson's disease: Secondary | ICD-10-CM | POA: Diagnosis not present

## 2021-03-12 DIAGNOSIS — Z96651 Presence of right artificial knee joint: Secondary | ICD-10-CM | POA: Diagnosis not present

## 2021-03-12 DIAGNOSIS — F419 Anxiety disorder, unspecified: Secondary | ICD-10-CM | POA: Diagnosis not present

## 2021-03-12 DIAGNOSIS — R52 Pain, unspecified: Secondary | ICD-10-CM | POA: Diagnosis not present

## 2021-03-12 DIAGNOSIS — D519 Vitamin B12 deficiency anemia, unspecified: Secondary | ICD-10-CM | POA: Diagnosis not present

## 2021-03-12 DIAGNOSIS — F32A Depression, unspecified: Secondary | ICD-10-CM | POA: Diagnosis not present

## 2021-03-12 DIAGNOSIS — Z471 Aftercare following joint replacement surgery: Secondary | ICD-10-CM | POA: Diagnosis not present

## 2021-03-12 DIAGNOSIS — E539 Vitamin B deficiency, unspecified: Secondary | ICD-10-CM | POA: Diagnosis not present

## 2021-03-12 LAB — SARS CORONAVIRUS 2 (TAT 6-24 HRS): SARS Coronavirus 2: NEGATIVE

## 2021-03-12 NOTE — Care Management Important Message (Signed)
Important Message  Patient Details  Name: Nancy Blackburn MRN: [id number] Date of Birth: [birth year]   Medicare Important Message Given:  Yes  Patient is in an isolation room so we talked by phone [phone]) and I reviewed the Important Message from Medicare with her.  She is in agreement with the discharge plan for today.  I asked if she would like a copy of the form and she replied no as she has a copy. I wished her well and thanked her for her time.  Nancy Blackburn 03/12/2021, 9:43 AM

## 2021-03-12 NOTE — Progress Notes (Signed)
Physical Therapy Treatment Patient Details Name: Nancy Blackburn MRN: [id number] DOB: [birth year] Today's Date: 03/12/2021    History of Present Illness Nancy Blackburn is a 75 year old female with right knee osteoarthritis who elected for total right knee arthroplasty. Past medical history positive for shortness of breath, asthma, anxiety, depression, GERD, hypertension, bradycardia, cervical cancer, Parkinsons disease, fracture of sacrum/coccyx, mild dementia, back surgery.    PT Comments    Pt in bed at entry, agreeable to session. Pt still requires manual assistance to perform SAQ and ABDCT heel slides in bed, but about 50% less assistance than previous day. Pt now in bone foam, with notable improvement in knee extension from yesterday. Pt still struggling to rise independently but can do so with heavy cues from an elevated surface. Pt able to progress AMB to hallway today for the first time, PWB observed as severe pain with loading will not allow any heavier. Pt left up in chair. Secretary made aware of need for ice in polar-care insulation box. Pt continues to progress well, appears DC is pending to STR today.     Follow Up Recommendations  SNF     Equipment Recommendations  Rolling walker with 5" wheels    Recommendations for Other Services       Precautions / Restrictions Precautions Precautions: Knee Precaution Booklet Issued: Yes (comment) Required Braces or Orthoses: Knee Immobilizer - Right Knee Immobilizer - Right: Other (comment) Restrictions Weight Bearing Restrictions: Yes RLE Weight Bearing: Partial weight bearing    Mobility  Bed Mobility Overal bed mobility: Modified Independent Bed Mobility: Supine to Sit     Supine to sit: Modified independent (Device/Increase time)     General bed mobility comments: labored effort, no asssit needed, additional time    Transfers Overall transfer level: Needs assistance Equipment used: Rolling walker (2  wheeled) Transfers: Sit to/from Stand Sit to Stand: From elevated surface;Min guard;Min assist         General transfer comment: minGuard first time, then supervision next 2 reps; requires heavy tactile/verbal cuing for modified technique.  Ambulation/Gait Ambulation/Gait assistance: Min guard;Supervision Gait Distance (Feet): 26 Feet Assistive device: Rolling walker (2 wheeled) Gait Pattern/deviations: Step-to pattern     General Gait Details: great posture, labored/antalgic gait, heavy UE use, mild RLE abducted gait given cues for correction; AMB to center of hallway over 5-6 minutes, then chair is brought up from posterior.   Stairs             Wheelchair Mobility    Modified Rankin (Stroke Patients Only)       Balance Overall balance assessment: Modified Independent;Mild deficits observed, not formally tested                                          Cognition Arousal/Alertness: Awake/alert Behavior During Therapy: WFL for tasks assessed/performed Overall Cognitive Status: Within Functional Limits for tasks assessed                                        Exercises Total Joint Exercises Short Arc Quad: Supine;Limitations;10 reps;AAROM Short Arc Quad Limitations: Requires slightly half as much assist than previous day but still unable to perform without considerable assist Hip ABduction/ADduction: AAROM;Both;Supine;15 reps;Limitations Hip Abduction/Adduction Limitations: improvd confidence today, albeit still painful Straight Leg Raises: AAROM;Both;Right;Supine;Limitations  Straight Leg Raises Limitations: unable to perform, or maintain knee lock whn assisted Long Arc Quad: Seated;AAROM;Limitations;15 reps;Left Long Arc Quad Limitations: working out Left knee stiffness to abate pain with transfers Goniometric ROM: 15-76 degrees Right knee P/ROM    General Comments        Pertinent Vitals/Pain Pain Assessment: 0-10 Pain  Score: 6  Pain Location: right knee with mobility/ADL Pain Descriptors / Indicators: Operative site guarding;Guarding;Aching Pain Intervention(s): Limited activity within patient's tolerance;Monitored during session;Premedicated before session    Home Living                      Prior Function            PT Goals (current goals can now be found in the care plan section) Acute Rehab PT Goals Patient Stated Goal: to go to rehab at discharge PT Goal Formulation: With patient Time For Goal Achievement: 03/23/21 Potential to Achieve Goals: Good Additional Goals Additional Goal #1: patient will increase right knee flexion to 90 degrees for increased independence with functional mobility Progress towards PT goals: Progressing toward goals    Frequency    BID      PT Plan Current plan remains appropriate    Co-evaluation              AM-PAC PT "6 Clicks" Mobility   Outcome Measure  Help needed turning from your back to your side while in a flat bed without using bedrails?: A Little Help needed moving from lying on your back to sitting on the side of a flat bed without using bedrails?: A Little Help needed moving to and from a bed to a chair (including a wheelchair)?: A Little Help needed standing up from a chair using your arms (e.g., wheelchair or bedside chair)?: A Little Help needed to walk in hospital room?: A Lot Help needed climbing 3-5 steps with a railing? : Total 6 Click Score: 15    End of Session Equipment Utilized During Treatment: Gait belt;Right knee immobilizer Activity Tolerance: Patient limited by pain;Patient tolerated treatment well;Patient limited by fatigue Patient left: with call bell/phone within reach;in chair Nurse Communication: Mobility status (SCD not applied but turned on, polar care applied but unplugged adn without any ice in cooler.) PT Visit Diagnosis: Pain;Muscle weakness (generalized) (M62.81);Difficulty in walking, not  elsewhere classified (R26.2) Pain - Right/Left: Right Pain - part of body: Knee     Time: 1448-1856 PT Time Calculation (min) (ACUTE ONLY): 34 min  Charges:  $Gait Training: 8-22 mins $Therapeutic Exercise: 8-22 mins                     10:50 AM, 03/12/21 Etta Grandchild, PT, DPT Physical Therapist - [facility]  [phone] ([place])     [place] 03/12/2021, 10:45 AM

## 2021-03-12 NOTE — TOC Transition Note (Signed)
Transition of Care [facility]) - CM/SW Discharge Note   Patient Details  Name: Nancy Blackburn MRN: [id number] Date of Birth: [birth year]  Transition of Care [facility]) CM/SW Contact:  Pete Pelt, RN Phone Number: 03/12/2021, 2:51 PM   Clinical Narrative:   TOC in to see patient at bedside.  Patient informed COVID negative, going to [facility] today as per Tammy, room 711.  Patient has been vaccinated with booster for COVID.Patient notified her friend via personal cell phone, friend will bring belongings to facility.  Care team aware, [facility] will [facility] at 4[facility].  No further questions or concerns from patient at this time.  TOC signing off.    Final next level of care: Skilled Nursing Facility Barriers to Discharge: Barriers Resolved   Patient Goals and CMS Choice Patient states their goals for this hospitalization and ongoing recovery are:: Agrees to SNF for rehab CMS Medicare.gov Compare Post Acute Care list provided to:: Patient Choice offered to / list presented to : Patient  Discharge Placement PASRR number recieved: 03/11/21            Patient chooses bed at: [facility] [place] Patient to be transferred to facility by: ambulance Name of family member notified: patient notified her friend independently Patient and family notified of of transfer: 03/12/21  Discharge Plan and Services   Discharge Planning Services: CM Consult Post Acute Care Choice: [facility]          DME Arranged:  (na, going to SNF) DME Agency: NA       HH Arranged:  (na going to snf)          Social Determinants of Health (SDOH) Interventions     Readmission Risk Interventions No flowsheet data found.

## 2021-03-12 NOTE — Plan of Care (Signed)
  Problem: Education: Goal: Knowledge of General Education information will improve Description: Including pain rating scale, medication(s)/side effects and non-pharmacologic comfort measures Outcome: Adequate for Discharge   Problem: Health Behavior/Discharge Planning: Goal: Ability to manage health-related needs will improve Outcome: Adequate for Discharge   Problem: Clinical Measurements: Goal: Ability to maintain clinical measurements within normal limits will improve Outcome: Adequate for Discharge Goal: Will remain free from infection Outcome: Adequate for Discharge Goal: Diagnostic test results will improve Outcome: Adequate for Discharge Goal: Respiratory complications will improve Outcome: Adequate for Discharge Goal: Cardiovascular complication will be avoided Outcome: Adequate for Discharge   Problem: Activity: Goal: Risk for activity intolerance will decrease Outcome: Adequate for Discharge   Problem: Nutrition: Goal: Adequate nutrition will be maintained Outcome: Adequate for Discharge   Problem: Elimination: Goal: Will not experience complications related to bowel motility Outcome: Adequate for Discharge Goal: Will not experience complications related to urinary retention Outcome: Adequate for Discharge   Problem: Pain Managment: Goal: General experience of comfort will improve Outcome: Adequate for Discharge   Problem: Safety: Goal: Ability to remain free from injury will improve Outcome: Adequate for Discharge   Problem: Skin Integrity: Goal: Risk for impaired skin integrity will decrease Outcome: Adequate for Discharge   Problem: Activity: Goal: Ability to avoid complications of mobility impairment will improve Outcome: Adequate for Discharge Goal: Ability to tolerate increased activity will improve Outcome: Adequate for Discharge   Problem: Education: Goal: Verbalization of understanding the information provided will improve Outcome: Adequate  for Discharge   Problem: Coping: Goal: Level of anxiety will decrease Outcome: Adequate for Discharge   Problem: Physical Regulation: Goal: Postoperative complications will be avoided or minimized Outcome: Adequate for Discharge   Problem: Respiratory: Goal: Ability to maintain a clear airway will improve Outcome: Adequate for Discharge   Problem: Pain Management: Goal: Pain level will decrease Outcome: Adequate for Discharge   Problem: Skin Integrity: Goal: Signs of wound healing will improve Outcome: Adequate for Discharge   Problem: Tissue Perfusion: Goal: Ability to maintain adequate tissue perfusion will improve Outcome: Adequate for Discharge

## 2021-03-12 NOTE — Discharge Summary (Signed)
Physician Discharge Summary  Patient ID: Nancy Blackburn MRN: [id number] DOB/AGE: 06/04/1946 75 y.o.  Admit date: 03/09/2021 Discharge date: 03/12/2021  Admission Diagnoses:  Right Knee Osteoarthritis <principal problem not specified>  Discharge Diagnoses:  Right Knee Osteoarthritis Active Problems:   S/P TKR (total knee replacement) using cement, right   Past Medical History:  Diagnosis Date  . Asthma   . Bradycardia   . Cervical cancer ([place]) 1978  . Chronic sinus infection   . Ejection fraction    a. EF 55-65%, echo, mild LVH, June, 2013, small pericardial effusion  . Fracture, sacrum/coccyx ([place])   . History of kidney stones   . Hyperlipidemia    a. takes Simvastatin  . Hypertension    a. takes Metoprolol, Lisinopril, and amlodipine.  . Low back pain   . Mild dementia ([place])   . Nasal polyps   . Normal nuclear stress test    a. 07/2005 Nl nuc stress test.  . Osteopenia   . Osteoporosis   . Palpitations    a. June 2013:  Event recorder normal sinus rhythm with rare PVCs - ? symptomatic PVC's.  . Parkinson disease ([place])   . Pericardial effusion    a. Small, echo, June, 2013  . PONV (postoperative nausea and vomiting)     Surgeries: Procedure(s): Right TOTAL KNEE ARTHROPLASTY on 03/09/2021   Consultants (if any):   Discharged Condition: Improved  Hospital Course: Nancy Blackburn is an 75 y.o. female who was admitted 03/09/2021 with a diagnosis of  Right Knee Osteoarthritis <principal problem not specified> and went to the operating room on 03/09/2021 and underwent an uncomplicated right TKA.    She was given perioperative antibiotics:  Anti-infectives (From admission, onward)   Start     Dose/Rate Route Frequency Ordered Stop   03/09/21 1400  ceFAZolin (ANCEF) IVPB 2g/100 mL premix        2 g 200 mL/hr over 30 Minutes Intravenous Every 6 hours 03/09/21 1232 03/10/21 2155   03/09/21 0818  clindamycin (CLEOCIN) 600 MG/50ML IVPB       Note to Pharmacy:  Ronnell Freshwater   : cabinet override      03/09/21 0818 03/09/21 0831   03/09/21 0616  ceFAZolin (ANCEF) 2-4 GM/100ML-% IVPB       Note to Pharmacy: Lyman Bishop   : cabinet override      03/09/21 0616 03/09/21 0825   03/09/21 0600  ceFAZolin (ANCEF) IVPB 2g/100 mL premix        2 g 200 mL/hr over 30 Minutes Intravenous On call to O.R. 03/08/21 2258 03/09/21 0820    .  She was given sequential compression devices, early ambulation, and lovenox for DVT prophylaxis.   She benefited maximally from the hospital stay and there were no complications.    Recent vital signs:  Vitals:   03/12/21 1239 03/12/21 1558  BP: (!) 147/65 137/89  Pulse: 92 (!) 105  Resp: 18 18  Temp: 98.4 F (36.9 C) 98.3 F (36.8 C)  SpO2: 92% 92%    Recent laboratory studies:  Lab Results  Component Value Date   HGB 12.4 03/11/2021   HGB 12.8 03/10/2021   HGB 15.3 (H) 02/26/2021   Lab Results  Component Value Date   WBC 10.2 03/11/2021   PLT 218 03/11/2021   Lab Results  Component Value Date   INR 1.0 02/26/2021   Lab Results  Component Value Date   NA 134 (L) 03/10/2021   K 4.2 03/10/2021  CL 102 03/10/2021   CO2 27 03/10/2021   BUN 13 03/10/2021   CREATININE 0.94 03/10/2021   GLUCOSE 128 (H) 03/10/2021    Discharge Medications:   Allergies as of 03/12/2021      Reactions   Anti-inflammatory Enzyme [nutritional Supplements] Other (See Comments)   REACTION: Due to bleeding ulcer   Cymbalta [duloxetine Hcl] Other (See Comments)   REACTION: Urinary retention   Clarithromycin Other (See Comments)   Upsets stomach   Donepezil Hcl Other (See Comments)   Significant bradycardia causing syncope   Nsaids    GI BLEED   Ranitidine    severe diarrhea   Venlafaxine    severe depression      Medication List    TAKE these medications   acetaminophen 325 MG tablet Commonly known as: TYLENOL Take 1-2 tablets (325-650 mg total) by mouth every 6 (six) hours as needed for mild pain (pain  score 1-3 or temp > 100.5).   amLODipine 5 MG tablet Commonly known as: NORVASC Take 1 tablet (5 mg total) by mouth daily.   busPIRone 10 MG tablet Commonly known as: BUSPAR Take 10 mg by mouth 2 (two) times daily.   Calcium 600+D3 600-800 MG-UNIT Tabs Generic drug: Calcium Carb-Cholecalciferol Take 1 tablet by mouth daily.   citalopram 40 MG tablet Commonly known as: CELEXA Take 40 mg by mouth daily.   docusate sodium 100 MG capsule Commonly known as: COLACE Take 1 capsule (100 mg total) by mouth 2 (two) times daily.   enoxaparin 40 MG/0.4ML injection Commonly known as: LOVENOX Inject 0.4 mLs (40 mg total) into the skin daily for 21 days.   fexofenadine 180 MG tablet Commonly known as: ALLEGRA Take 180 mg by mouth daily.   oxyCODONE 5 MG immediate release tablet Commonly known as: Oxy IR/ROXICODONE Take 1-2 tablets (5-10 mg total) by mouth every 4 (four) hours as needed for moderate pain (pain score 4-6).   primidone 50 MG tablet Commonly known as: MYSOLINE Take 50 mg by mouth at bedtime.   rosuvastatin 10 MG tablet Commonly known as: CRESTOR TAKE 1 TABLET EVERY DAY   traZODone 100 MG tablet Commonly known as: DESYREL Take 100 mg by mouth at bedtime.   vitamin B-12 1000 MCG tablet Commonly known as: CYANOCOBALAMIN Take 1,000 mcg by mouth daily.       Diagnostic Studies: DG Knee Right Port  Result Date: 03/09/2021 CLINICAL DATA:  Patient status post right knee replacement today. EXAM: PORTABLE RIGHT KNEE - 1-2 VIEW COMPARISON:  None. FINDINGS: Total arthroplasty is in place. No fracture is identified. Hardware appears normal. Gas in the soft tissues and surgical staples noted. IMPRESSION: Status post right knee replacement.  No acute abnormality. Electronically Signed   By: Inge Rise M.D.   On: 03/09/2021 12:18   MM 3D SCREEN BREAST BILATERAL  Result Date: 03/02/2021 CLINICAL DATA:  Screening. EXAM: DIGITAL SCREENING BILATERAL MAMMOGRAM WITH  TOMOSYNTHESIS AND CAD TECHNIQUE: Bilateral screening digital craniocaudal and mediolateral oblique mammograms were obtained. Bilateral screening digital breast tomosynthesis was performed. The images were evaluated with computer-aided detection. COMPARISON:  Previous exam(s). ACR Breast Density Category c: The breast tissue is heterogeneously dense, which may obscure small masses. FINDINGS: There are no findings suspicious for malignancy. The images were evaluated with computer-aided detection. IMPRESSION: No mammographic evidence of malignancy. A result letter of this screening mammogram will be mailed directly to the patient. RECOMMENDATION: Screening mammogram in one year. (Code:SM-B-01Y) BI-RADS CATEGORY  1: Negative. Electronically Signed  By: Margarette Canada M.D.   On: 03/02/2021 08:50    Disposition: Discharge disposition: 03-Skilled Nursing Facility       Discharge Instructions    Call MD / Call 911   Complete by: As directed    If you experience chest pain or shortness of breath, CALL 911 and be transported to the hospital emergency room.  If you develope a fever above 101 F, pus (white drainage) or increased drainage or redness at the wound, or calf pain, call your surgeon's office.   Constipation Prevention   Complete by: As directed    Drink plenty of fluids.  Prune juice may be helpful.  You may use a stool softener, such as Colace (over the counter) 100 mg twice a day.  Use MiraLax (over the counter) for constipation as needed.   Diet general   Complete by: As directed    Discharge instructions   Complete by: As directed    The patient will follow up at [facility] on 03-22-2021 10:30 AM  The patient may continue to bear weight on the right lower extremity with use of a walker. The patient should continue to use TED stockings until follow-up. Patient should remove the TED stockings at night for sleep. The patient needs to continue to elevate the right lower extremity whenever  possible. The knee immobilizer should be used at night. The patient may remove the knee immobilizer to perform exercises or sit in a chair during the day.  Patient should not place a pillow under their knee. The Polar Care may be used by the patient for comfort.  The dressing should remain on until follow up in the office. The patient will take lovenox 40 mg injection once a day for blood clot prevention and continue to work on knee range of motion exercises at home as instructed by physical therapy until follow-up in the office.   Driving restrictions   Complete by: As directed    No driving for 2-59 weeks   Increase activity slowly as tolerated   Complete by: As directed    Lifting restrictions   Complete by: As directed    No lifting for 12-16 weeks       Contact information for after-discharge care    Destination    [facility] Preferred SNF .   Service: Skilled Nursing Contact information: [address] [place] [phone]                   Signed: Thornton Park ,MD 03/12/2021, 4:13 PM

## 2021-03-12 NOTE — Progress Notes (Signed)
DC order noted.  Reviewed discharge instructions with patient. Questions answered and understanding verbalized.  Copy given.  Patient discharged off unit at this time via stretcher by EMS stable without complaint.

## 2021-03-12 NOTE — Progress Notes (Signed)
  Subjective:  POD #3 s/p Right TKA.   Patient reports right pain as moderate.  Patient's pain depends on her activity level.  She has more pain during physical therapy.  Patient states her breathing is improved today.  She is no longer requiring supplemental O2 with nasal cannula.  Objective:   VITALS:   Vitals:   03/12/21 0529 03/12/21 0843 03/12/21 0900 03/12/21 1239  BP: (!) 144/55 (!) 151/54  (!) 147/65  Pulse: (!) 101 (!) 103  92  Resp: 20 19  18   Temp: 98 F (36.7 C) 98.7 F (37.1 C)  98.4 F (36.9 C)  TempSrc:      SpO2: 96% 91% 93% 92%  Weight:      Height:        PHYSICAL EXAM: Right lower extremity Neurovascular intact Sensation intact distally Intact pulses distally Dorsiflexion/Plantar flexion intact Aquacel dressing with small amount of serosanguineous drainage.  No change from yesterday. No cellulitis present Compartment soft  LABS  Results for orders placed or performed during the hospital encounter of 03/09/21 (from the past 24 hour(s))  SARS CORONAVIRUS 2 (TAT 6-24 HRS) Nasopharyngeal Nasopharyngeal Swab     Status: None   Collection Time: 03/11/21  5:45 PM   Specimen: Nasopharyngeal Swab  Result Value Ref Range   SARS Coronavirus 2 NEGATIVE NEGATIVE    No results found.  Assessment/Plan: 3 Days Post-Op   Active Problems:   S/P TKR (total knee replacement) using cement, right   Patient is making good progress with physical therapy.  She will continue with Lovenox for DVT prophylaxis.  Patient is being discharged to a skilled nursing facility today.   Thornton Park , MD 03/12/2021, 1:28 PM

## 2021-03-15 DIAGNOSIS — M150 Primary generalized (osteo)arthritis: Secondary | ICD-10-CM | POA: Diagnosis not present

## 2021-03-15 DIAGNOSIS — G20 Parkinson's disease: Secondary | ICD-10-CM | POA: Diagnosis not present

## 2021-03-15 DIAGNOSIS — I10 Essential (primary) hypertension: Secondary | ICD-10-CM | POA: Diagnosis not present

## 2021-03-22 DIAGNOSIS — M170 Bilateral primary osteoarthritis of knee: Secondary | ICD-10-CM | POA: Diagnosis not present

## 2021-03-22 DIAGNOSIS — Z4889 Encounter for other specified surgical aftercare: Secondary | ICD-10-CM | POA: Diagnosis not present

## 2021-03-26 DIAGNOSIS — E785 Hyperlipidemia, unspecified: Secondary | ICD-10-CM | POA: Diagnosis not present

## 2021-03-26 DIAGNOSIS — I10 Essential (primary) hypertension: Secondary | ICD-10-CM | POA: Diagnosis not present

## 2021-03-26 DIAGNOSIS — D519 Vitamin B12 deficiency anemia, unspecified: Secondary | ICD-10-CM | POA: Diagnosis not present

## 2021-03-26 DIAGNOSIS — M810 Age-related osteoporosis without current pathological fracture: Secondary | ICD-10-CM | POA: Diagnosis not present

## 2021-03-26 DIAGNOSIS — M150 Primary generalized (osteo)arthritis: Secondary | ICD-10-CM | POA: Diagnosis not present

## 2021-03-26 DIAGNOSIS — F411 Generalized anxiety disorder: Secondary | ICD-10-CM | POA: Diagnosis not present

## 2021-03-26 DIAGNOSIS — G20 Parkinson's disease: Secondary | ICD-10-CM | POA: Diagnosis not present

## 2021-03-29 DIAGNOSIS — Z96651 Presence of right artificial knee joint: Secondary | ICD-10-CM | POA: Diagnosis not present

## 2021-03-31 DIAGNOSIS — Z96651 Presence of right artificial knee joint: Secondary | ICD-10-CM | POA: Diagnosis not present

## 2021-04-04 ENCOUNTER — Emergency Department
Admission: EM | Admit: 2021-04-04 | Discharge: 2021-04-04 | Disposition: A | Discharge status: Home / Self Care | Payer: Medicare Other | Attending: Emergency Medicine | Admitting: Emergency Medicine

## 2021-04-04 ENCOUNTER — Emergency Department: Payer: Medicare Other

## 2021-04-04 ENCOUNTER — Other Ambulatory Visit: Payer: Self-pay

## 2021-04-04 ENCOUNTER — Encounter: Payer: Self-pay | Admitting: Intensive Care

## 2021-04-04 DIAGNOSIS — Z96651 Presence of right artificial knee joint: Secondary | ICD-10-CM | POA: Insufficient documentation

## 2021-04-04 DIAGNOSIS — R0781 Pleurodynia: Secondary | ICD-10-CM | POA: Insufficient documentation

## 2021-04-04 DIAGNOSIS — I10 Essential (primary) hypertension: Secondary | ICD-10-CM | POA: Insufficient documentation

## 2021-04-04 DIAGNOSIS — K449 Diaphragmatic hernia without obstruction or gangrene: Secondary | ICD-10-CM | POA: Diagnosis not present

## 2021-04-04 DIAGNOSIS — Z20822 Contact with and (suspected) exposure to covid-19: Secondary | ICD-10-CM | POA: Diagnosis not present

## 2021-04-04 DIAGNOSIS — R0602 Shortness of breath: Secondary | ICD-10-CM

## 2021-04-04 DIAGNOSIS — Z79899 Other long term (current) drug therapy: Secondary | ICD-10-CM | POA: Insufficient documentation

## 2021-04-04 DIAGNOSIS — Z8541 Personal history of malignant neoplasm of cervix uteri: Secondary | ICD-10-CM | POA: Insufficient documentation

## 2021-04-04 DIAGNOSIS — E876 Hypokalemia: Secondary | ICD-10-CM | POA: Diagnosis not present

## 2021-04-04 DIAGNOSIS — J45909 Unspecified asthma, uncomplicated: Secondary | ICD-10-CM | POA: Insufficient documentation

## 2021-04-04 DIAGNOSIS — F0280 Dementia in other diseases classified elsewhere without behavioral disturbance: Secondary | ICD-10-CM | POA: Insufficient documentation

## 2021-04-04 DIAGNOSIS — G20 Parkinson's disease: Secondary | ICD-10-CM | POA: Insufficient documentation

## 2021-04-04 LAB — HEPATIC FUNCTION PANEL
ALT: 10 U/L (ref 0–44)
AST: 18 U/L (ref 15–41)
Albumin: 3.3 g/dL — ABNORMAL LOW (ref 3.5–5.0)
Alkaline Phosphatase: 115 U/L (ref 38–126)
Bilirubin, Direct: <0.1 mg/dL (ref 0.0–0.2)
Total Bilirubin: 0.7 mg/dL (ref 0.3–1.2)
Total Protein: 6.3 g/dL — ABNORMAL LOW (ref 6.5–8.1)

## 2021-04-04 LAB — CBC
HCT: 37.3 % (ref 36.0–46.0)
Hemoglobin: 12.3 g/dL (ref 12.0–15.0)
MCH: 28.9 pg (ref 26.0–34.0)
MCHC: 33 g/dL (ref 30.0–36.0)
MCV: 87.6 fL (ref 80.0–100.0)
Platelets: 406 10*3/uL — ABNORMAL HIGH (ref 150–400)
RBC: 4.26 MIL/uL (ref 3.87–5.11)
RDW: 13.1 % (ref 11.5–15.5)
WBC: 8.7 10*3/uL (ref 4.0–10.5)
nRBC: 0 % (ref 0.0–0.2)

## 2021-04-04 LAB — BASIC METABOLIC PANEL
Anion gap: 12 (ref 5–15)
BUN: 13 mg/dL (ref 8–23)
CO2: 25 mmol/L (ref 22–32)
Calcium: 9 mg/dL (ref 8.9–10.3)
Chloride: 102 mmol/L (ref 98–111)
Creatinine, Ser: 0.86 mg/dL (ref 0.44–1.00)
GFR, Estimated: >60 mL/min (ref >60)
Glucose, Bld: 91 mg/dL (ref 70–99)
Potassium: 2.9 mmol/L — ABNORMAL LOW (ref 3.5–5.1)
Sodium: 139 mmol/L (ref 135–145)

## 2021-04-04 LAB — RESP PANEL BY RT-PCR (FLU A&B, COVID) ARPGX2
Influenza A by PCR: NEGATIVE
Influenza B by PCR: NEGATIVE
SARS Coronavirus 2 by RT PCR: NEGATIVE

## 2021-04-04 LAB — TROPONIN I (HIGH SENSITIVITY)
Troponin I (High Sensitivity): 15 ng/L (ref <18)
Troponin I (High Sensitivity): 14 ng/L (ref <18)

## 2021-04-04 LAB — BRAIN NATRIURETIC PEPTIDE: B Natriuretic Peptide: 107.5 pg/mL — ABNORMAL HIGH (ref 0.0–100.0)

## 2021-04-04 MED ORDER — POTASSIUM CHLORIDE ER 10 MEQ PO TBCR: 20.0000 meq | EXTENDED_RELEASE_TABLET | Freq: Every day | ORAL | 0 refills | Status: DC

## 2021-04-04 MED ORDER — IOHEXOL 350 MG/ML SOLN
75.0000 mL | Freq: Once | INTRAVENOUS | Status: AC | PRN
  Administered 2021-04-04: 75 mL via INTRAVENOUS

## 2021-04-04 MED ORDER — LIDOCAINE 5 % EX PTCH
1.0000 | MEDICATED_PATCH | CUTANEOUS | Status: DC
  Administered 2021-04-04: 1 via TRANSDERMAL
  Filled 2021-04-04: qty 1

## 2021-04-04 MED ORDER — POTASSIUM CHLORIDE CRYS ER 20 MEQ PO TBCR
40.0000 meq | EXTENDED_RELEASE_TABLET | Freq: Once | ORAL | Status: AC
  Administered 2021-04-04: 40 meq via ORAL
  Filled 2021-04-04: qty 2

## 2021-04-04 MED ORDER — ACETAMINOPHEN 500 MG PO TABS
1000.0000 mg | ORAL_TABLET | Freq: Once | ORAL | Status: AC
  Administered 2021-04-04: 1000 mg via ORAL
  Filled 2021-04-04: qty 2

## 2021-04-04 MED ORDER — IPRATROPIUM-ALBUTEROL 0.5-2.5 (3) MG/3ML IN SOLN
3.0000 mL | Freq: Once | RESPIRATORY_TRACT | Status: AC
  Administered 2021-04-04: 3 mL via RESPIRATORY_TRACT
  Filled 2021-04-04: qty 3

## 2021-04-04 MED ORDER — LORAZEPAM 2 MG/ML IJ SOLN
0.5000 mg | Freq: Once | INTRAMUSCULAR | Status: AC
  Administered 2021-04-04: 0.5 mg via INTRAVENOUS
  Filled 2021-04-04: qty 1

## 2021-04-04 MED ORDER — LIDOCAINE 5 % EX PTCH: 1.0000 | MEDICATED_PATCH | Freq: Two times a day (BID) | CUTANEOUS | 0 refills | Status: AC

## 2021-04-04 NOTE — ED Provider Notes (Signed)
[facility] Emergency Department Provider Note  ____________________________________________   Event Date/Time   First MD Initiated Contact with Patient 04/04/21 1459     (approximate)  I have reviewed the triage vital signs and the nursing notes.   HISTORY  Chief Complaint Shortness of Breath    HPI Nancy Blackburn is a 75 y.o. female with EF of 55%, small pericardial effusion back in June 2013, Parkinson's disease, hypertension, hyperlipidemia who comes in for shortness of breath.  Patient comes in with shortness of breath dyspnea on the past few weeks and is progressively getting worse.  Patient reports having knee surgery on 03/09/2021.  Patient states that 2 days ago she bent over and felt like her rib pulled on her right side and since then she is having worsening shortness of breath.  She also reports then developing pain on the left side of her ribs.  Her shortness of breath is constant, moderate, nothing makes it better, worse with exertion but also still apparent at rest.  Patient denies any cough or sick contacts.  Patient was on blood thinners after the surgery but was recently stopped about 1 week ago.  She denies any worsening leg swelling just some postop swelling around the right knee.  Patient does report seeing a pulmonologist years ago where she was on inhalers but she was not exactly sure why he states that her pulmonary function test were normal but she felt much better with inhalers.  She has not been on those recently.          Past Medical History:  Diagnosis Date  . Asthma   . Bradycardia   . Cervical cancer ([place]) 1978  . Chronic sinus infection   . Ejection fraction    a. EF 55-65%, echo, mild LVH, June, 2013, small pericardial effusion  . Fracture, sacrum/coccyx ([place])   . History of kidney stones   . Hyperlipidemia    a. takes Simvastatin  . Hypertension    a. takes Metoprolol, Lisinopril, and amlodipine.  . Low back  pain   . Mild dementia ([place])   . Nasal polyps   . Normal nuclear stress test    a. 07/2005 Nl nuc stress test.  . Osteopenia   . Osteoporosis   . Palpitations    a. June 2013:  Event recorder normal sinus rhythm with rare PVCs - ? symptomatic PVC's.  . Parkinson disease ([place])   . Pericardial effusion    a. Small, echo, June, 2013  . PONV (postoperative nausea and vomiting)     Patient Active Problem List   Diagnosis Date Noted  . S/P TKR (total knee replacement) using cement, right 03/09/2021  . Bradycardia 07/03/2020  . Postural dizziness with presyncope 07/01/2020  . Family history of colon cancer in mother 02/22/2018  . Cervical spondylosis 05/04/2017  . Cervical radiculopathy 05/04/2017  . Family history of Cushing disease 01/12/2017  . B12 deficiency 12/18/2016  . Mild cognitive impairment with memory loss 12/18/2016  . Parkinsonian features 12/18/2016  . Vitamin D deficiency 12/18/2016  . Chalazion, bilateral 06/08/2016  . Absolute anemia 07/30/2015  . Edema extremities 07/30/2015  . Benign essential tremor 07/30/2015  . Anxiety, generalized 07/30/2015  . Gastro-esophageal reflux disease without esophagitis 07/30/2015  . HLD (hyperlipidemia) 07/30/2015  . Cannot sleep 07/30/2015  . Lumbar radiculopathy 07/30/2015  . Depression, major, recurrent, in partial remission ([place]) 07/30/2015  . Arthritis, degenerative 07/30/2015  . Allergic rhinitis 07/30/2015  . Gastroduodenal ulcer 07/30/2015  .  Calcium blood increased 07/30/2015  . Pre-syncope 05/17/2015  . Dyspnea 03/31/2015  . Bleeding stomach ulcer 10/14/2013  . Anemia, iron deficiency 10/14/2013  . Weakness generalized 10/14/2013  . Ejection fraction   . Pericardial effusion   . Palpitations   . Low back pain   . Normal nuclear stress test     Past Surgical History:  Procedure Laterality Date  . BACK SURGERY  08/2010  . BLADDER SUSPENSION  2003  . bone spur  2009   removed from right collar bone area and  partial collar bone removed  . CATARACT EXTRACTION W/ INTRAOCULAR LENS  IMPLANT, BILATERAL    . EYE SURGERY    . LUMBAR FUSION  2011, 2012  . SHOULDER ARTHROSCOPY Right 2012  . THYROIDECTOMY, PARTIAL  1976  . TONSILLECTOMY     as child  . TOTAL KNEE ARTHROPLASTY Right 03/09/2021   Procedure: Right TOTAL KNEE ARTHROPLASTY;  Surgeon: Thornton Park, MD;  Location: [facility] ORS;  Service: Orthopedics;  Laterality: Right;  . TUBAL LIGATION  1977  . VAGINAL HYSTERECTOMY  1978   d/t cervical cancer    Prior to Admission medications   Medication Sig Start Date End Date Taking? Authorizing Provider  acetaminophen (TYLENOL) 325 MG tablet Take 1-2 tablets (325-650 mg total) by mouth every 6 (six) hours as needed for mild pain (pain score 1-3 or temp > 100.5). 03/11/21   Thornton Park, MD  amLODipine (NORVASC) 5 MG tablet Take 1 tablet (5 mg total) by mouth daily. 02/16/21   Jerrol Banana., MD  busPIRone (BUSPAR) 10 MG tablet Take 10 mg by mouth 2 (two) times daily.  05/09/17   [provider]  Calcium Carb-Cholecalciferol (CALCIUM 600+D3) 600-800 MG-UNIT TABS Take 1 tablet by mouth daily.    [provider]  citalopram (CELEXA) 40 MG tablet Take 40 mg by mouth daily. 08/06/15   [provider]  docusate sodium (COLACE) 100 MG capsule Take 1 capsule (100 mg total) by mouth 2 (two) times daily. 03/11/21   Thornton Park, MD  enoxaparin (LOVENOX) 40 MG/0.4ML injection Inject 0.4 mLs (40 mg total) into the skin daily for 21 days. 03/12/21 04/02/21  Thornton Park, MD  fexofenadine (ALLEGRA) 180 MG tablet Take 180 mg by mouth daily.    [provider]  oxyCODONE (OXY IR/ROXICODONE) 5 MG immediate release tablet Take 1-2 tablets (5-10 mg total) by mouth every 4 (four) hours as needed for moderate pain (pain score 4-6). 03/11/21   Thornton Park, MD  primidone (MYSOLINE) 50 MG tablet Take 50 mg by mouth at bedtime.    [provider]  rosuvastatin (CRESTOR)  10 MG tablet TAKE 1 TABLET EVERY DAY Patient taking differently: Take 10 mg by mouth daily. 09/18/20   Jerrol Banana., MD  traZODone (DESYREL) 100 MG tablet Take 100 mg by mouth at bedtime.    [provider]  vitamin B-12 (CYANOCOBALAMIN) 1000 MCG tablet Take 1,000 mcg by mouth daily.    [provider]    Allergies Anti-inflammatory enzyme [nutritional supplements], Cymbalta [duloxetine hcl], Clarithromycin, Donepezil hcl, Nsaids, Ranitidine, and Venlafaxine  Family History  Problem Relation Age of Onset  . Colon cancer Mother   . Heart murmur Mother   . Heart disease Mother   . Angina Mother   . Heart disease Father   . COPD Father   . Atrial fibrillation Brother   . Cushing syndrome Daughter   . Cancer Daughter  thyroid  . Breast cancer Cousin   . Hypotension Neg Hx   . Malignant hyperthermia Neg Hx   . Pseudochol deficiency Neg Hx     Social History Social History   Tobacco Use  . Smoking status: Never Smoker  . Smokeless tobacco: Never Used  Vaping Use  . Vaping Use: Never used  Substance Use Topics  . Alcohol use: Yes    Alcohol/week: 0.0 standard drinks    Comment: rarely  . Drug use: No      Review of Systems Constitutional: No fever/chills Eyes: No visual changes. ENT: No sore throat. Cardiovascular: No chest pain Respiratory: Positive for SOB Gastrointestinal: No abdominal pain.  No nausea, no vomiting.  No diarrhea.  No constipation. Genitourinary: Negative for dysuria. Musculoskeletal: Negative for back pain. Skin: Negative for rash. Neurological: Negative for headaches, focal weakness or numbness. All other ROS negative ____________________________________________   PHYSICAL EXAM:  VITAL SIGNS: ED Triage Vitals  Enc Vitals Group     BP 04/04/21 1441 138/75     Pulse Rate 04/04/21 1441 71     Resp 04/04/21 1441 (!) 22     Temp 04/04/21 1441 98.6 F (37 C)     Temp Source 04/04/21 1441 Oral     SpO2  04/04/21 1438 96 %     Weight 04/04/21 1439 178 lb (80.7 kg)     Height 04/04/21 1439 5\' 7"  (1.702 m)     Head Circumference --      Peak Flow --      Pain Score 04/04/21 1438 5     Pain Loc --      Pain Edu? --      Excl. in [place]? --     Constitutional: Alert and oriented. Well appearing and in no acute distress. Eyes: Conjunctivae are normal. EOMI. Head: Atraumatic. Nose: No congestion/rhinnorhea. Mouth/Throat: Mucous membranes are moist.   Neck: No stridor. Trachea Midline. FROM Cardiovascular: Normal rate, regular rhythm. Grossly normal heart sounds.  Good peripheral circulation. Respiratory: Increased work of breathing with clear lungs Gastrointestinal: Soft and nontender. No distention. No abdominal bruits.  Musculoskeletal: No lower extremity tenderness nor edema.  No joint effusions. Neurologic:  Normal speech and language. No gross focal neurologic deficits are appreciated.  Skin:  Skin is warm, dry and intact. No rash noted. Psychiatric: Mood and affect are normal. Speech and behavior are normal. GU: Deferred   ____________________________________________   LABS (all labs ordered are listed, but only abnormal results are displayed)  Labs Reviewed  CBC - Abnormal; Notable for the following components:      Result Value   Platelets 406 (*)    All other components within normal limits  RESP PANEL BY RT-PCR (FLU A&B, COVID) ARPGX2  BASIC METABOLIC PANEL  HEPATIC FUNCTION PANEL  BRAIN NATRIURETIC PEPTIDE  TROPONIN I (HIGH SENSITIVITY)   ____________________________________________   ED ECG REPORT I, Vanessa McKean, the attending physician, personally viewed and interpreted this ECG.  EKG is sinus rate of 70, no ST elevations, T wave inversions in aVL, normal intervals ____________________________________________  RADIOLOGY Robert Bellow, personally viewed and evaluated these images (plain radiographs) as part of my medical decision making, as well as reviewing  the written report by the radiologist.  ED MD interpretation: No pneumonia  Official radiology report(s): DG Chest 2 View  Result Date: 04/04/2021 CLINICAL DATA:  Shortness of breath for a few weeks. EXAM: CHEST - 2 VIEW COMPARISON:  07/01/2020 FINDINGS: The heart is normal  in size. Stable tortuosity and calcification of the thoracic aorta. The pulmonary hila appear normal. Stable moderate eventration of the right hemidiaphragm. No infiltrates, edema or effusions. No pulmonary lesions. The bony thorax is intact. IMPRESSION: No acute cardiopulmonary findings. Electronically Signed   By: Marijo Sanes M.D.   On: 04/04/2021 15:21    ____________________________________________   PROCEDURES  Procedure(s) performed (including Critical Care):  .1-3 Lead EKG Interpretation Performed by: Vanessa Salmon Brook, MD Authorized by: Vanessa , MD     Interpretation: normal     ECG rate:  70s   ECG rate assessment: normal     Rhythm: sinus rhythm     Ectopy: none     Conduction: normal       ____________________________________________   INITIAL IMPRESSION / ASSESSMENT AND PLAN / ED COURSE   Nancy Blackburn was evaluated in Emergency Department on 04/04/2021 for the symptoms described in the history of present illness. She was evaluated in the context of the global COVID-19 pandemic, which necessitated consideration that the patient might be at risk for infection with the SARS-CoV-2 virus that causes COVID-19. Institutional protocols and algorithms that pertain to the evaluation of patients at risk for COVID-19 are in a state of rapid change based on information released by regulatory bodies including the CDC and federal and state organizations. These policies and algorithms were followed during the patient's care in the ED.     Pt presents with SOB. Differential includes: PNA-will get xray to evaluation Anemia-CBC to evaluate ACS- will get trops Arrhythmia-Will get EKG and keep on  monitor.  COVID- will get testing per algorithm. PE-consider given patient's work of breathing and recent knee surgery.  Chest x-ray was negative for pneumonia or pleural effusion.  Therefore I think we should proceed with CT PE to rule out pulmonary embolism.  We will trial a DuoNeb given patient does report history of being on inhalers previously although I do not really hear any wheezing on my examination.  We will give some Tylenol and lidocaine patch to help with some rib pain in case this is just causing some splinting.   Repeat troponin is negative  6:24 PM states that she did not like the DuoNeb.  She states that it did not help her feel any better.  She however did state that the Ativan seemed to help a lot.  She does report feeling a little anxious about her breathing after she developed some pain in her ribs when she had bent over.  suspect there is a component of anxiety related to this.  Her CT scan was negative for pericardial effusion or PE.  She does have an enlarged pulmonary trunk which can be seen with pulmonary arterial hypertension which I instructed patient to follow-up with her pulmonary doctor for.  She is also to follow-up with her primary care doctor for her anxiety.  She denies any SI.  At this time her oxygen levels have been 100% since being in the emergency room she does not have any desaturation with ambulation she feels comfortable with going home.  We will try some lidocaine patches for her pain    ____________________________________________   FINAL CLINICAL IMPRESSION(S) / ED DIAGNOSES   Final diagnoses:  SOB (shortness of breath)     MEDICATIONS GIVEN DURING THIS VISIT:  Medications  lidocaine (LIDODERM) 5 % 1 patch (1 patch Transdermal Patch Applied 04/04/21 1550)  acetaminophen (TYLENOL) tablet 1,000 mg (1,000 mg Oral Given 04/04/21 1550)  ipratropium-albuterol (DUONEB)  0.5-2.5 (3) MG/3ML nebulizer solution 3 mL (3 mLs Nebulization Given 04/04/21 1550)   iohexol (OMNIPAQUE) 350 MG/ML injection 75 mL (75 mLs Intravenous Contrast Given 04/04/21 1634)  LORazepam (ATIVAN) injection 0.5 mg (0.5 mg Intravenous Given 04/04/21 1609)  potassium chloride SA (KLOR-CON) CR tablet 40 mEq (40 mEq Oral Given 04/04/21 1848)     ED Discharge Orders         Ordered    lidocaine (LIDODERM) 5 %  Every 12 hours        04/04/21 1854    potassium chloride (KLOR-CON) 10 MEQ tablet  Daily        04/04/21 1855           Note:  This document was prepared using Dragon voice recognition software and may include unintentional dictation errors.   Vanessa Sawmill, MD 04/04/21 [id number]

## 2021-04-04 NOTE — ED Notes (Signed)
Pt ambulatory to toilet with walker. Pt noted to maintain oxygen saturation of 100% on room air while ambulating.

## 2021-04-04 NOTE — ED Triage Notes (Signed)
Patient c/o sob that has been ongoing for a few weeks and has progressively gotten worse. Right Knee surgery on 03/09/21 and reports being sob while in rehab intermittently. Patient has labored breathing while at rest

## 2021-04-04 NOTE — Discharge Instructions (Addendum)
Work-up was reassuring oxygen levels were 100%.  Follow-up with your primary care doctor and your pulmonologist.  Return the ER if develop worsening shortness of breath, fevers or any other concerns IMPRESSION:  1. No evidence of pulmonary embolism or other acute intrathoracic  process.  2. Enlarged pulmonary trunk, which can be seen with pulmonary  arterial hypertension.  3. Small hiatal hernia.  4. Aortic atherosclerosis.

## 2021-04-05 DIAGNOSIS — F5105 Insomnia due to other mental disorder: Secondary | ICD-10-CM | POA: Diagnosis not present

## 2021-04-05 DIAGNOSIS — F411 Generalized anxiety disorder: Secondary | ICD-10-CM | POA: Diagnosis not present

## 2021-04-05 DIAGNOSIS — F331 Major depressive disorder, recurrent, moderate: Secondary | ICD-10-CM | POA: Diagnosis not present

## 2021-04-06 DIAGNOSIS — Z96651 Presence of right artificial knee joint: Secondary | ICD-10-CM | POA: Diagnosis not present

## 2021-04-14 ENCOUNTER — Ambulatory Visit (INDEPENDENT_AMBULATORY_CARE_PROVIDER_SITE_OTHER): Payer: Medicare Other | Admitting: Family Medicine

## 2021-04-14 ENCOUNTER — Encounter: Payer: Self-pay | Admitting: Family Medicine

## 2021-04-14 ENCOUNTER — Other Ambulatory Visit: Payer: Self-pay

## 2021-04-14 ENCOUNTER — Telehealth: Payer: Self-pay | Admitting: *Deleted

## 2021-04-14 VITALS — BP 130/83 | HR 93 | Temp 98.1°F | Resp 18 | Wt 176.8 lb

## 2021-04-14 DIAGNOSIS — F3341 Major depressive disorder, recurrent, in partial remission: Secondary | ICD-10-CM | POA: Diagnosis not present

## 2021-04-14 DIAGNOSIS — R0602 Shortness of breath: Secondary | ICD-10-CM

## 2021-04-14 DIAGNOSIS — Z96651 Presence of right artificial knee joint: Secondary | ICD-10-CM | POA: Diagnosis not present

## 2021-04-14 DIAGNOSIS — E538 Deficiency of other specified B group vitamins: Secondary | ICD-10-CM | POA: Diagnosis not present

## 2021-04-14 DIAGNOSIS — G20 Parkinson's disease: Secondary | ICD-10-CM | POA: Diagnosis not present

## 2021-04-14 DIAGNOSIS — F411 Generalized anxiety disorder: Secondary | ICD-10-CM

## 2021-04-14 DIAGNOSIS — E876 Hypokalemia: Secondary | ICD-10-CM

## 2021-04-14 NOTE — Progress Notes (Signed)
Established patient visit   [name]   DOB: Apr 15, 1946   75 y.o. Female  MRN: [id number] Visit Date: 04/14/2021  Today's healthcare provider: Wilhemena Durie, MD   Chief Complaint  Patient presents with  . ER followup   Subjective    HPI  Patient comes in with complaint of significant anxiety recently.  Times as she was seen in the ED recently she felt much better after receiving benzodiazepine.  Work-up in the ED was completely negative.  She is already followed by psychiatry for depression and anxiety. Follow up ER visit  Patient was seen in ER for shortness of breath on 04/04/2021. She was treated for;  shortness of breath Treatment for this included; see notes in chart. Patient states that she was prescribed potasium supplement and states that she has since completed She reports good compliance with treatment. She reports this condition is Improved.  -----------------------------------------------------------------------------------------    Patient Active Problem List   Diagnosis Date Noted  . S/P TKR (total knee replacement) using cement, right 03/09/2021  . Bradycardia 07/03/2020  . Postural dizziness with presyncope 07/01/2020  . Family history of colon cancer in mother 02/22/2018  . Cervical spondylosis 05/04/2017  . Cervical radiculopathy 05/04/2017  . Family history of Cushing disease 01/12/2017  . B12 deficiency 12/18/2016  . Mild cognitive impairment with memory loss 12/18/2016  . Parkinsonian features 12/18/2016  . Vitamin D deficiency 12/18/2016  . Chalazion, bilateral 06/08/2016  . Absolute anemia 07/30/2015  . Edema extremities 07/30/2015  . Benign essential tremor 07/30/2015  . Anxiety, generalized 07/30/2015  . Gastro-esophageal reflux disease without esophagitis 07/30/2015  . HLD (hyperlipidemia) 07/30/2015  . Cannot sleep 07/30/2015  . Lumbar radiculopathy 07/30/2015  . Depression, major, recurrent, in partial  remission ([place]) 07/30/2015  . Arthritis, degenerative 07/30/2015  . Allergic rhinitis 07/30/2015  . Gastroduodenal ulcer 07/30/2015  . Calcium blood increased 07/30/2015  . Pre-syncope 05/17/2015  . Dyspnea 03/31/2015  . Bleeding stomach ulcer 10/14/2013  . Anemia, iron deficiency 10/14/2013  . Weakness generalized 10/14/2013  . Ejection fraction   . Pericardial effusion   . Palpitations   . Low back pain   . Normal nuclear stress test    Past Medical History:  Diagnosis Date  . Asthma   . Bradycardia   . Cervical cancer ([place]) 1978  . Chronic sinus infection   . Ejection fraction    a. EF 55-65%, echo, mild LVH, June, 2013, small pericardial effusion  . Fracture, sacrum/coccyx ([place])   . History of kidney stones   . Hyperlipidemia    a. takes Simvastatin  . Hypertension    a. takes Metoprolol, Lisinopril, and amlodipine.  . Low back pain   . Mild dementia ([place])   . Nasal polyps   . Normal nuclear stress test    a. 07/2005 Nl nuc stress test.  . Osteopenia   . Osteoporosis   . Palpitations    a. June 2013:  Event recorder normal sinus rhythm with rare PVCs - ? symptomatic PVC's.  . Parkinson disease ([place])   . Pericardial effusion    a. Small, echo, June, 2013  . PONV (postoperative nausea and vomiting)    Allergies  Allergen Reactions  . Anti-Inflammatory Enzyme [Nutritional Supplements] Other (See Comments)    REACTION: Due to bleeding ulcer  . Cymbalta [Duloxetine Hcl] Other (See Comments)    REACTION: Urinary retention  . Clarithromycin Other (See Comments)    Upsets stomach  .  Donepezil Hcl Other (See Comments)    Significant bradycardia causing syncope  . Nsaids     GI BLEED  . Ranitidine     severe diarrhea  . Venlafaxine     severe depression       Medications: Outpatient Medications Prior to Visit  Medication Sig  . acetaminophen (TYLENOL) 325 MG tablet Take 1-2 tablets (325-650 mg total) by mouth every 6 (six) hours as needed for mild pain (pain  score 1-3 or temp > 100.5).  Marland Kitchen amLODipine (NORVASC) 5 MG tablet Take 1 tablet (5 mg total) by mouth daily.  . busPIRone (BUSPAR) 10 MG tablet Take 10 mg by mouth 2 (two) times daily.   . Calcium Carb-Cholecalciferol (CALCIUM 600+D3) 600-800 MG-UNIT TABS Take 1 tablet by mouth daily.  . citalopram (CELEXA) 40 MG tablet Take 40 mg by mouth daily.  Marland Kitchen docusate sodium (COLACE) 100 MG capsule Take 1 capsule (100 mg total) by mouth 2 (two) times daily.  . fexofenadine (ALLEGRA) 180 MG tablet Take 180 mg by mouth daily.  Marland Kitchen oxyCODONE (OXY IR/ROXICODONE) 5 MG immediate release tablet Take 1-2 tablets (5-10 mg total) by mouth every 4 (four) hours as needed for moderate pain (pain score 4-6).  Marland Kitchen primidone (MYSOLINE) 50 MG tablet Take 50 mg by mouth at bedtime.  . rosuvastatin (CRESTOR) 10 MG tablet TAKE 1 TABLET EVERY DAY (Patient taking differently: Take 10 mg by mouth daily.)  . traZODone (DESYREL) 100 MG tablet Take 100 mg by mouth at bedtime.  . vitamin B-12 (CYANOCOBALAMIN) 1000 MCG tablet Take 1,000 mcg by mouth daily.  Marland Kitchen enoxaparin (LOVENOX) 40 MG/0.4ML injection Inject 0.4 mLs (40 mg total) into the skin daily for 21 days.  . potassium chloride (KLOR-CON) 10 MEQ tablet Take 2 tablets (20 mEq total) by mouth daily for 5 days.   No facility-administered medications prior to visit.    Review of Systems  Constitutional: Negative for appetite change, chills, fatigue and fever.  Respiratory: Negative for chest tightness and shortness of breath.   Cardiovascular: Negative for chest pain and palpitations.  Gastrointestinal: Negative for abdominal pain, nausea and vomiting.  Neurological: Negative for dizziness and weakness.    Last CBC Lab Results  Component Value Date   WBC 8.7 04/04/2021   HGB 12.3 04/04/2021   HCT 37.3 04/04/2021   MCV 87.6 04/04/2021   MCH 28.9 04/04/2021   RDW 13.1 04/04/2021   PLT 406 (H) 04/04/2021       Objective    BP 130/83   Pulse 93   Temp 98.1 F (36.7 C)  (Oral)   Resp 18   Wt 176 lb 12.8 oz (80.2 kg)   SpO2 99%   BMI 27.69 kg/m      Physical Exam Vitals reviewed.  Constitutional:      Appearance: Normal appearance. She is well-developed.     Comments: Patient appears well.  This is the best she has looked in several years.  HENT:     Head: Normocephalic and atraumatic.     Right Ear: External ear normal.     Left Ear: External ear normal.     Nose: Nose normal.  Eyes:     General: No scleral icterus.    Conjunctiva/sclera: Conjunctivae normal.  Neck:     Thyroid: No thyromegaly.  Cardiovascular:     Rate and Rhythm: Normal rate and regular rhythm.     Heart sounds: Normal heart sounds.  Pulmonary:     Effort: Pulmonary effort is  normal.     Breath sounds: Normal breath sounds.  Chest:  Breasts: Breasts are symmetrical.     Right: Normal.     Left: Normal.    Abdominal:     Palpations: Abdomen is soft.  Skin:    General: Skin is warm and dry.  Neurological:     Mental Status: She is alert and oriented to person, place, and time. Mental status is at baseline.  Psychiatric:        Mood and Affect: Mood normal.        Behavior: Behavior normal.        Thought Content: Thought content normal.        Judgment: Judgment normal.     Comments: Slightly anxious today.       No results found for any visits on 04/14/21.  Assessment & Plan     .1. Shortness of breath Improved with benzodiazepine.  Work-up has been negative.  Follow-up in 1 month. - CBC with Differential/Platelet  2. Hypokalemia Place potassium check renal panel - Renal function panel  3. GAD (generalized anxiety disorder) Add lorazepam 0.5 mg 3 times daily as needed #90 with 5 refills.  Follow-up in a month - TSH  4. Parkinson's disease ([place]) Clinically stable  5. B12 deficiency On treatment  6. Depression, major, recurrent, in partial remission ([place]) Followed by psychiatry   No follow-ups on file.      I, Wilhemena Durie, MD,  have reviewed all documentation for this visit. The documentation on 04/17/21 for the exam, diagnosis, procedures, and orders are all accurate and complete.    Tarra Pence Cranford Mon, MD  [facility] [phone] (phone) [phone] (fax)  [place]

## 2021-04-14 NOTE — Telephone Encounter (Signed)
Please advise 

## 2021-04-14 NOTE — Telephone Encounter (Signed)
Copied from [place] [id number]. Topic: General - Other >> Apr 14, 2021  4:35 PM Mcneil, Ja-Kwan wrote: Reason for CRM: Pt stated she went to her pharmacy but the Rx for LORazepam (ATIVAN) 0.5 MG tablet had not been received. Pt asked that the Rx be sent to [place], [place]

## 2021-04-15 LAB — RENAL FUNCTION PANEL
Albumin: 3.9 g/dL (ref 3.7–4.7)
BUN/Creatinine Ratio: 15 (ref 12–28)
BUN: 14 mg/dL (ref 8–27)
CO2: 26 mmol/L (ref 20–29)
Calcium: 9.7 mg/dL (ref 8.7–10.3)
Chloride: 101 mmol/L (ref 96–106)
Creatinine, Ser: 0.94 mg/dL (ref 0.57–1.00)
Glucose: 102 mg/dL — ABNORMAL HIGH (ref 65–99)
Phosphorus: 2.6 mg/dL — ABNORMAL LOW (ref 3.0–4.3)
Potassium: 4 mmol/L (ref 3.5–5.2)
Sodium: 141 mmol/L (ref 134–144)
eGFR: 63 mL/min/{1.73_m2} (ref >59)

## 2021-04-15 LAB — CBC WITH DIFFERENTIAL/PLATELET
Basophils Absolute: 0.1 10*3/uL (ref 0.0–0.2)
Basos: 1 %
EOS (ABSOLUTE): 0.2 10*3/uL (ref 0.0–0.4)
Eos: 2 %
Hematocrit: 41.6 % (ref 34.0–46.6)
Hemoglobin: 13.3 g/dL (ref 11.1–15.9)
Immature Grans (Abs): 0 10*3/uL (ref 0.0–0.1)
Immature Granulocytes: 0 %
Lymphocytes Absolute: 1.7 10*3/uL (ref 0.7–3.1)
Lymphs: 23 %
MCH: 28.1 pg (ref 26.6–33.0)
MCHC: 32 g/dL (ref 31.5–35.7)
MCV: 88 fL (ref 79–97)
Monocytes Absolute: 0.9 10*3/uL (ref 0.1–0.9)
Monocytes: 13 %
Neutrophils Absolute: 4.3 10*3/uL (ref 1.4–7.0)
Neutrophils: 61 %
Platelets: 386 10*3/uL (ref 150–450)
RBC: 4.73 x10E6/uL (ref 3.77–5.28)
RDW: 12.7 % (ref 11.7–15.4)
WBC: 7.2 10*3/uL (ref 3.4–10.8)

## 2021-04-15 LAB — TSH: TSH: 1.62 u[IU]/mL (ref 0.450–4.500)

## 2021-04-15 MED ORDER — LORAZEPAM 0.5 MG PO TABS: 0.5000 mg | ORAL_TABLET | Freq: Three times a day (TID) | ORAL | 5 refills | Status: DC | PRN

## 2021-04-15 NOTE — Telephone Encounter (Signed)
Sent!

## 2021-04-16 DIAGNOSIS — Z96651 Presence of right artificial knee joint: Secondary | ICD-10-CM | POA: Diagnosis not present

## 2021-04-19 DIAGNOSIS — Z96651 Presence of right artificial knee joint: Secondary | ICD-10-CM | POA: Diagnosis not present

## 2021-04-23 DIAGNOSIS — Z96651 Presence of right artificial knee joint: Secondary | ICD-10-CM | POA: Diagnosis not present

## 2021-04-23 DIAGNOSIS — Z96659 Presence of unspecified artificial knee joint: Secondary | ICD-10-CM | POA: Diagnosis not present

## 2021-04-26 DIAGNOSIS — Z96651 Presence of right artificial knee joint: Secondary | ICD-10-CM | POA: Diagnosis not present

## 2021-04-29 DIAGNOSIS — Z96651 Presence of right artificial knee joint: Secondary | ICD-10-CM | POA: Diagnosis not present

## 2021-05-04 DIAGNOSIS — Z96651 Presence of right artificial knee joint: Secondary | ICD-10-CM | POA: Diagnosis not present

## 2021-05-11 DIAGNOSIS — Z96651 Presence of right artificial knee joint: Secondary | ICD-10-CM | POA: Diagnosis not present

## 2021-05-13 ENCOUNTER — Ambulatory Visit: Payer: Medicare Other | Admitting: Family Medicine

## 2021-05-14 ENCOUNTER — Other Ambulatory Visit: Payer: Self-pay | Admitting: Family Medicine

## 2021-05-14 DIAGNOSIS — Z96651 Presence of right artificial knee joint: Secondary | ICD-10-CM | POA: Diagnosis not present

## 2021-05-14 DIAGNOSIS — R0602 Shortness of breath: Secondary | ICD-10-CM

## 2021-05-18 ENCOUNTER — Telehealth (INDEPENDENT_AMBULATORY_CARE_PROVIDER_SITE_OTHER): Payer: Medicare Other | Admitting: Family Medicine

## 2021-05-18 ENCOUNTER — Telehealth: Payer: Self-pay

## 2021-05-18 ENCOUNTER — Other Ambulatory Visit: Payer: Self-pay

## 2021-05-18 DIAGNOSIS — U071 COVID-19: Secondary | ICD-10-CM

## 2021-05-18 DIAGNOSIS — Z20822 Contact with and (suspected) exposure to covid-19: Secondary | ICD-10-CM | POA: Diagnosis not present

## 2021-05-18 DIAGNOSIS — R059 Cough, unspecified: Secondary | ICD-10-CM | POA: Diagnosis not present

## 2021-05-18 DIAGNOSIS — Z03818 Encounter for observation for suspected exposure to other biological agents ruled out: Secondary | ICD-10-CM | POA: Diagnosis not present

## 2021-05-18 MED ORDER — MOLNUPIRAVIR 200 MG PO CAPS
ORAL_CAPSULE | ORAL | 0 refills | Status: DC
  Filled 2021-05-18: qty 40, 5d supply, fill #0

## 2021-05-18 MED ORDER — NIRMATRELVIR/RITONAVIR (PAXLOVID)TABLET
3.0000 | ORAL_TABLET | Freq: Two times a day (BID) | ORAL | 0 refills | Status: DC
  Filled 2021-05-18: qty 30, 5d supply, fill #0

## 2021-05-18 NOTE — Progress Notes (Signed)
MyChart Video Visit    Virtual Visit via Video Note   This visit type was conducted due to national recommendations for restrictions regarding the COVID-19 Pandemic (e.g. social distancing) in an effort to limit this patient's exposure and mitigate transmission in our community. This patient is at least at moderate risk for complications without adequate follow up. This format is felt to be most appropriate for this patient at this time. Physical exam was limited by quality of the video and audio technology used for the visit.   Patient location: Home Provider location: Office  I discussed the limitations of evaluation and management by telemedicine and the availability of in person appointments. The patient expressed understanding and agreed to proceed.  [name]   DOB: May 13, 1946   75 y.o. Female  MRN: [id number] Visit Date: 05/18/2021  Today's healthcare provider: Wilhemena Durie, MD   Chief Complaint  Patient presents with  . Follow-up  . Abdominal Pain  . Covid Positive    Patient states that she tested postive today for Covid virus, she was tested at [facility], she states URI like symptoms began on Sunday 05/16/21. Patient denies fever or sweats.  but reports cough, dyspnea, fatigue, muscle/joint ache,chills   Subjective    HPI  Grandsons live with patient and they both developed COVID over the weekend and 2 days ago the patient developed COVID symptoms and had a positive test.  She feels as though she has a cold with head congestion drainage and cough.  No fevers or myalgias or shortness of breath.  Since her last visit the breast tree is actually helped her breathing.  She is still waiting for pulmonology appointment HPI    Covid Positive    Comments: Patient states that she tested postive today for Covid virus, she was tested at [facility], she states URI like symptoms began on Sunday 05/16/21. Patient denies fever or sweats.  but  reports cough, dyspnea, fatigue, muscle/joint ache,chills       Last edited by Minette Headland, CMA on 05/18/2021  3:43 PM. (History)      Abdominal Pain This is a new problem. The current episode started 1 to 4 weeks ago. The onset quality is sudden. The problem occurs daily. The problem has been unchanged. The pain is located in the periumbilical region. The quality of the pain is aching and sharp. The abdominal pain does not radiate. Associated symptoms include belching and frequency. Pertinent negatives include no anorexia, arthralgias, constipation, diarrhea, dysuria, fever, flatus, headaches, hematochezia, hematuria, melena, myalgias, nausea, vomiting or weight loss. The pain is aggravated by eating. The pain is relieved by nothing. Treatments tried: Tums, and Prilosec. The treatment provided no relief. diverticulitis       Follow up for dyspnea   The patient was last seen for this 1 months ago. Changes made at last visit include no medication changes, patient reports that she was referred to Pulmonologist but did not hear anything from office.   She reports good compliance with treatment. She feels that condition is Improved. She is not having side effects.   Anxiety, Follow-up  She was last seen for anxiety 1 months ago. Changes made at last visit include adding lorazepam 0.5mg  as needed.   She reports good compliance with treatment. She reports good tolerance of treatment. She is not having side effects.    She feels her anxiety is mild and Unchanged since last visit.  Symptoms: No chest pain No difficulty  concentrating  No dizziness No fatigue  No feelings of losing control No insomnia  No irritable No palpitations  No panic attacks No racing thoughts  No shortness of breath No sweating  No tremors/shakes     Patient Active Problem List   Diagnosis Date Noted  . S/P TKR (total knee replacement) using cement, right 03/09/2021  . Bradycardia 07/03/2020  .  Postural dizziness with presyncope 07/01/2020  . Family history of colon cancer in mother 02/22/2018  . Cervical spondylosis 05/04/2017  . Cervical radiculopathy 05/04/2017  . Family history of Cushing disease 01/12/2017  . B12 deficiency 12/18/2016  . Mild cognitive impairment with memory loss 12/18/2016  . Parkinsonian features 12/18/2016  . Vitamin D deficiency 12/18/2016  . Chalazion, bilateral 06/08/2016  . Absolute anemia 07/30/2015  . Edema extremities 07/30/2015  . Benign essential tremor 07/30/2015  . Anxiety, generalized 07/30/2015  . Gastro-esophageal reflux disease without esophagitis 07/30/2015  . HLD (hyperlipidemia) 07/30/2015  . Cannot sleep 07/30/2015  . Lumbar radiculopathy 07/30/2015  . Depression, major, recurrent, in partial remission ([place]) 07/30/2015  . Arthritis, degenerative 07/30/2015  . Allergic rhinitis 07/30/2015  . Gastroduodenal ulcer 07/30/2015  . Calcium blood increased 07/30/2015  . Pre-syncope 05/17/2015  . Dyspnea 03/31/2015  . Bleeding stomach ulcer 10/14/2013  . Anemia, iron deficiency 10/14/2013  . Weakness generalized 10/14/2013  . Ejection fraction   . Pericardial effusion   . Palpitations   . Low back pain   . Normal nuclear stress test    Past Medical History:  Diagnosis Date  . Asthma   . Bradycardia   . Cervical cancer ([place]) 1978  . Chronic sinus infection   . Ejection fraction    a. EF 55-65%, echo, mild LVH, June, 2013, small pericardial effusion  . Fracture, sacrum/coccyx ([place])   . History of kidney stones   . Hyperlipidemia    a. takes Simvastatin  . Hypertension    a. takes Metoprolol, Lisinopril, and amlodipine.  . Low back pain   . Mild dementia ([place])   . Nasal polyps   . Normal nuclear stress test    a. 07/2005 Nl nuc stress test.  . Osteopenia   . Osteoporosis   . Palpitations    a. June 2013:  Event recorder normal sinus rhythm with rare PVCs - ? symptomatic PVC's.  . Parkinson disease ([place])   . Pericardial  effusion    a. Small, echo, June, 2013  . PONV (postoperative nausea and vomiting)    Allergies  Allergen Reactions  . Anti-Inflammatory Enzyme [Nutritional Supplements] Other (See Comments)    REACTION: Due to bleeding ulcer  . Cymbalta [Duloxetine Hcl] Other (See Comments)    REACTION: Urinary retention  . Clarithromycin Other (See Comments)    Upsets stomach  . Donepezil Hcl Other (See Comments)    Significant bradycardia causing syncope  . Nsaids     GI BLEED  . Ranitidine     severe diarrhea  . Venlafaxine     severe depression      Medications: Outpatient Medications Prior to Visit  Medication Sig  . acetaminophen (TYLENOL) 325 MG tablet Take 1-2 tablets (325-650 mg total) by mouth every 6 (six) hours as needed for mild pain (pain score 1-3 or temp > 100.5).  Marland Kitchen amLODipine (NORVASC) 5 MG tablet Take 1 tablet (5 mg total) by mouth daily.  . busPIRone (BUSPAR) 10 MG tablet Take 10 mg by mouth 2 (two) times daily.   . Calcium Carb-Cholecalciferol (CALCIUM 600+D3) 600-800  MG-UNIT TABS Take 1 tablet by mouth daily.  . citalopram (CELEXA) 40 MG tablet Take 40 mg by mouth daily.  Marland Kitchen docusate sodium (COLACE) 100 MG capsule Take 1 capsule (100 mg total) by mouth 2 (two) times daily.  . fexofenadine (ALLEGRA) 180 MG tablet Take 180 mg by mouth daily.  Marland Kitchen LORazepam (ATIVAN) 0.5 MG tablet Take 1 tablet (0.5 mg total) by mouth 3 (three) times daily as needed for anxiety.  . primidone (MYSOLINE) 50 MG tablet Take 50 mg by mouth at bedtime.  . rosuvastatin (CRESTOR) 10 MG tablet TAKE 1 TABLET EVERY DAY (Patient taking differently: Take 10 mg by mouth daily.)  . traMADol (ULTRAM) 50 MG tablet tramadol 50 mg tablet  Take 1 tablet every 6-8 hours by oral route.  . traZODone (DESYREL) 100 MG tablet Take 100 mg by mouth at bedtime.  . vitamin B-12 (CYANOCOBALAMIN) 1000 MCG tablet Take 1,000 mcg by mouth daily.  Marland Kitchen enoxaparin (LOVENOX) 40 MG/0.4ML injection Inject 0.4 mLs (40 mg total) into the  skin daily for 21 days.  . potassium chloride (KLOR-CON) 10 MEQ tablet Take 2 tablets (20 mEq total) by mouth daily for 5 days.  . [DISCONTINUED] oxyCODONE (OXY IR/ROXICODONE) 5 MG immediate release tablet Take 1-2 tablets (5-10 mg total) by mouth every 4 (four) hours as needed for moderate pain (pain score 4-6).   No facility-administered medications prior to visit.    Review of Systems     Objective    There were no vitals taken for this visit.    Physical Exam  Patient is alert and oriented and comfortable on the virtual exam.  She certainly has no respiratory distress.  A little bit she does cough is a dry cough.   Assessment & Plan     1. COVID-19 She has had 3 vaccines but due to multiple risk factors we will send in an antiviral just in case patient worsens.  She has had symptoms for 2 days now. Molnupiravir 2. Cough Patient improved on breast 3.  Awaiting pulmonary referral.   No follow-ups on file.     I discussed the assessment and treatment plan with the patient. The patient was provided an opportunity to ask questions and all were answered. The patient agreed with the plan and demonstrated an understanding of the instructions.   The patient was advised to call back or seek an in-person evaluation if the symptoms worsen or if the condition fails to improve as anticipated.  I provided 11 minutes of non-face-to-face time during this encounter.  I, Wilhemena Durie, MD, have reviewed all documentation for this visit. The documentation on 05/19/21 for the exam, diagnosis, procedures, and orders are all accurate and complete.   Jodilyn Giese Cranford Mon, MD [facility] [phone] (phone) [phone] (fax)  [place]

## 2021-05-18 NOTE — Progress Notes (Deleted)
Established patient visit   [name]   DOB: [birth year]   75 y.o. Female  MRN: [id number] Visit Date: 05/18/2021  Today's healthcare provider: Wilhemena Durie, MD   Chief Complaint  Patient presents with  . Follow-up  . Abdominal Pain  . Covid Positive    Patient states that she tested postive today for Covid virus, she was tested at [facility], she states URI like symptoms began on Sunday 05/16/21. Patient denies fever or sweats.  but reports cough, dyspnea, fatigue, muscle/joint ache,chills   Subjective    Abdominal Pain This is a new problem. The current episode started 1 to 4 weeks ago. The onset quality is sudden. The problem occurs daily. The problem has been unchanged. The pain is located in the periumbilical region. The quality of the pain is aching and sharp. The abdominal pain does not radiate. Associated symptoms include belching and frequency. Pertinent negatives include no anorexia, arthralgias, constipation, diarrhea, dysuria, fever, flatus, headaches, hematochezia, hematuria, melena, myalgias, nausea, vomiting or weight loss. The pain is aggravated by eating. The pain is relieved by nothing. Treatments tried: Tums, and Prilosec. The treatment provided no relief. diverticulitis   HPI    Covid Positive    Comments: Patient states that she tested postive today for Covid virus, she was tested at [facility], she states URI like symptoms began on Sunday 05/16/21. Patient denies fever or sweats.  but reports cough, dyspnea, fatigue, muscle/joint ache,chills       Last edited by Minette Headland, CMA on 05/18/2021  3:43 PM. (History)      Follow up for dyspnea   The patient was last seen for this 1 months ago. Changes made at last visit include no medication changes, patient reports that she was referred to Pulmonologist but did not hear anything from office.   She reports good compliance with treatment. She feels that condition is  Improved. She is not having side effects.   Anxiety, Follow-up  She was last seen for anxiety 1 months ago. Changes made at last visit include adding lorazepam 0.5mg  as needed.   She reports good compliance with treatment. She reports good tolerance of treatment. She is not having side effects. {document side effects if present:1}  She feels her anxiety is mild and Unchanged since last visit.  Symptoms: No chest pain No difficulty concentrating  No dizziness No fatigue  No feelings of losing control No insomnia  No irritable No palpitations  No panic attacks No racing thoughts  No shortness of breath No sweating  No tremors/shakes    GAD-7 Results No flowsheet data found.  PHQ-9 Scores PHQ9 SCORE ONLY 02/16/2021 12/17/2019 04/10/2019  PHQ-9 Total Score 0 4 3     {Show patient history (optional):23778::" "}   Medications: Outpatient Medications Prior to Visit  Medication Sig  . acetaminophen (TYLENOL) 325 MG tablet Take 1-2 tablets (325-650 mg total) by mouth every 6 (six) hours as needed for mild pain (pain score 1-3 or temp > 100.5).  Marland Kitchen amLODipine (NORVASC) 5 MG tablet Take 1 tablet (5 mg total) by mouth daily.  . busPIRone (BUSPAR) 10 MG tablet Take 10 mg by mouth 2 (two) times daily.   . Calcium Carb-Cholecalciferol (CALCIUM 600+D3) 600-800 MG-UNIT TABS Take 1 tablet by mouth daily.  . citalopram (CELEXA) 40 MG tablet Take 40 mg by mouth daily.  Marland Kitchen docusate sodium (COLACE) 100 MG capsule Take 1 capsule (100 mg total) by mouth 2 (two)  times daily.  . fexofenadine (ALLEGRA) 180 MG tablet Take 180 mg by mouth daily.  Marland Kitchen LORazepam (ATIVAN) 0.5 MG tablet Take 1 tablet (0.5 mg total) by mouth 3 (three) times daily as needed for anxiety.  . primidone (MYSOLINE) 50 MG tablet Take 50 mg by mouth at bedtime.  . rosuvastatin (CRESTOR) 10 MG tablet TAKE 1 TABLET EVERY DAY (Patient taking differently: Take 10 mg by mouth daily.)  . traMADol (ULTRAM) 50 MG tablet tramadol 50 mg tablet   Take 1 tablet every 6-8 hours by oral route.  . traZODone (DESYREL) 100 MG tablet Take 100 mg by mouth at bedtime.  . vitamin B-12 (CYANOCOBALAMIN) 1000 MCG tablet Take 1,000 mcg by mouth daily.  Marland Kitchen enoxaparin (LOVENOX) 40 MG/0.4ML injection Inject 0.4 mLs (40 mg total) into the skin daily for 21 days.  . potassium chloride (KLOR-CON) 10 MEQ tablet Take 2 tablets (20 mEq total) by mouth daily for 5 days.  . [DISCONTINUED] oxyCODONE (OXY IR/ROXICODONE) 5 MG immediate release tablet Take 1-2 tablets (5-10 mg total) by mouth every 4 (four) hours as needed for moderate pain (pain score 4-6).   No facility-administered medications prior to visit.    Review of Systems  Constitutional: Negative for fever and weight loss.  Gastrointestinal: Positive for abdominal pain. Negative for anorexia, constipation, diarrhea, flatus, hematochezia, melena, nausea and vomiting.  Genitourinary: Positive for frequency. Negative for dysuria and hematuria.  Musculoskeletal: Negative for arthralgias and myalgias.  Neurological: Negative for headaches.    {Labs  Heme  Chem  Endocrine  Serology  Results Review (optional):23779::" "}   Objective    There were no vitals taken for this visit. {Show previous vital signs (optional):23777::" "}   Physical Exam  ***  No results found for any visits on 05/18/21.  Assessment & Plan     ***  No follow-ups on file.      {provider attestation***:1}   Wilhemena Durie, MD  [facility] [phone] (phone) [phone] (fax)  [place]

## 2021-05-18 NOTE — Telephone Encounter (Signed)
[facility] contacted the office and stated below: We just received the Paxlovid Rx for this patient. I wanted to check with you and Dr. Rosanna Randy about a possible major drug interaction I'm seeing with her primidone. It is not recommended to use Paxlovid in patients taking primidone. Can we consider using molnupiravir in this patient instead?  Per Dr. Rosanna Randy okay to switch to [facility], pharmacist has been advised and prescription has been changed at pharmacy. KW

## 2021-05-25 DIAGNOSIS — Z96651 Presence of right artificial knee joint: Secondary | ICD-10-CM | POA: Diagnosis not present

## 2021-05-27 ENCOUNTER — Telehealth: Payer: Self-pay

## 2021-05-27 DIAGNOSIS — I10 Essential (primary) hypertension: Secondary | ICD-10-CM

## 2021-05-27 MED ORDER — AMLODIPINE BESYLATE 5 MG PO TABS: 5.0000 mg | ORAL_TABLET | Freq: Every day | ORAL | 3 refills | Status: DC

## 2021-05-27 NOTE — Telephone Encounter (Signed)
[facility]) Pharmacy faxed refill request for the following medications:  amLODipine (NORVASC) 5 MG tablet   Please advise.

## 2021-05-27 NOTE — Telephone Encounter (Signed)
Rx sent to pharmacy   

## 2021-06-04 DIAGNOSIS — Z96651 Presence of right artificial knee joint: Secondary | ICD-10-CM | POA: Diagnosis not present

## 2021-06-24 DIAGNOSIS — Z96659 Presence of unspecified artificial knee joint: Secondary | ICD-10-CM | POA: Diagnosis not present

## 2021-06-28 DIAGNOSIS — H26492 Other secondary cataract, left eye: Secondary | ICD-10-CM | POA: Diagnosis not present

## 2021-07-01 DIAGNOSIS — Z96651 Presence of right artificial knee joint: Secondary | ICD-10-CM | POA: Diagnosis not present

## 2021-07-02 ENCOUNTER — Other Ambulatory Visit: Payer: Self-pay

## 2021-07-02 ENCOUNTER — Ambulatory Visit
Admission: RE | Admit: 2021-07-02 | Discharge: 2021-07-02 | Disposition: A | Discharge status: Home / Self Care | Payer: Medicare Other | Source: Ambulatory Visit | Attending: Adult Health | Admitting: Adult Health

## 2021-07-02 ENCOUNTER — Ambulatory Visit (INDEPENDENT_AMBULATORY_CARE_PROVIDER_SITE_OTHER): Payer: Medicare Other | Admitting: Adult Health

## 2021-07-02 ENCOUNTER — Encounter: Payer: Self-pay | Admitting: Adult Health

## 2021-07-02 VITALS — BP 110/70 | HR 70 | Temp 98.1°F | Ht 67.0 in | Wt 173.6 lb

## 2021-07-02 DIAGNOSIS — J4521 Mild intermittent asthma with (acute) exacerbation: Secondary | ICD-10-CM

## 2021-07-02 DIAGNOSIS — R0602 Shortness of breath: Secondary | ICD-10-CM | POA: Diagnosis not present

## 2021-07-02 DIAGNOSIS — J45909 Unspecified asthma, uncomplicated: Secondary | ICD-10-CM | POA: Insufficient documentation

## 2021-07-02 MED ORDER — FLUTICASONE FUROATE-VILANTEROL 100-25 MCG/INH IN AEPB: 1.0000 | INHALATION_SPRAY | Freq: Every day | RESPIRATORY_TRACT | 0 refills | Status: AC

## 2021-07-02 MED ORDER — FLUTICASONE FUROATE-VILANTEROL 100-25 MCG/INH IN AEPB: 1.0000 | INHALATION_SPRAY | Freq: Every day | RESPIRATORY_TRACT | 5 refills | Status: DC

## 2021-07-02 MED ORDER — ALBUTEROL SULFATE HFA 108 (90 BASE) MCG/ACT IN AERS: 1.0000 | INHALATION_SPRAY | Freq: Four times a day (QID) | RESPIRATORY_TRACT | 2 refills | Status: AC | PRN

## 2021-07-02 NOTE — Assessment & Plan Note (Signed)
Possible asthma flare - off maintenance medications  Restart ICS/LABA  Check PFT on return  Check chest xray today   Plan  Patient Instructions  Chest xray today .  Begin BREO 1 puff daily, rinse after use.  Albuterol inhaler As needed   Follow up with with Dr. Patsey Berthold or Morganne Haile NP in 6-8 weeks PFT  Please contact office for sooner follow up if symptoms do not improve or worsen or seek emergency care

## 2021-07-02 NOTE — Assessment & Plan Note (Signed)
Chronic dyspnea ? Etiology  Check chest xray and PFT  CT chest 03/2021 mild apical scarring , no PE or nodules  Echo 2021 mild DD, EF ok. nml PAP   Plan  Patient Instructions  Chest xray today .  Begin BREO 1 puff daily, rinse after use.  Albuterol inhaler As needed   Follow up with with Dr. Patsey Berthold or Delitha Elms NP in 6-8 weeks PFT  Please contact office for sooner follow up if symptoms do not improve or worsen or seek emergency care

## 2021-07-02 NOTE — Progress Notes (Signed)
$'@Patient'x$  ID: Nancy Blackburn, female    DOB: 10-05-1946, 75 y.o.   MRN: [id number]  Chief Complaint  Patient presents with   Follow-up    Referring provider: Jerrol Banana.,*  HPI: 75 year old female never smoker followed for asthma  TEST/EVENTS :  Echocardiogram 04/14/16: LVEF 123456, grade 2 diastolic dysfunction, LA mildly dilated Myoview 04/15/15: no evidence of ischemia PFTs 07/14/16: Normal spirometry, mild restriction, normal DLCO 6 MWT 07/14/16: 336 meters. Limited by arthritic pain in knees > dyspnea 2D echo July 2021 EF 60 to 123456, grade 1 diastolic dysfunction, normal pulmonary artery systolic pressure CT chest 03/2021  mild biapical scarring, negative PE, negative pulmonary nodules   07/02/2021 Follow up ; Asthma Nancy Blackburn  Patient returns for a follow-up visit.  She was last seen in 2019 for asthma.  Patient says she was doing well up until 1 year ago.  She had previously been on Advair but stopped it a couple years ago when she was doing so well.  Patient is noticed over the last year she has had increased shortness of breath with activity, decreased activity tolerance.  Says she started developing knee problems and was unable to be active and became very sedentary in the last 2 years.  She had a right knee replacement in March 2022.  Since then has been having ongoing shortness of breath.  She had a CT chest April 04, 2021 that showed no acute process.  Negative for PE. 2D echo in July 2021 showed preserved EF at 60 to 123456 grade 1 diastolic dysfunction and normal pulmonary artery systolic pressure.  She denies any cough or wheezing.  Walk test in the office shows no desaturations on room air.  She did restart physical therapy this week  She denies any chest pain orthopnea PND or increased leg swelling She did have COVID-19 infection in early June 2022.  She was given antiviral Molnupuravir .   Allergies  Allergen Reactions   Anti-Inflammatory Enzyme  [Nutritional Supplements] Other (See Comments)    REACTION: Due to bleeding ulcer   Cymbalta [Duloxetine Hcl] Other (See Comments)    REACTION: Urinary retention   Clarithromycin Other (See Comments)    Upsets stomach   Donepezil Hcl Other (See Comments)    Significant bradycardia causing syncope   Nsaids     GI BLEED   Ranitidine     severe diarrhea   Venlafaxine     severe depression    Immunization History  Administered Date(s) Administered   Fluad Quad(high Dose 65+) 10/21/2019, 10/14/2020   Influenza Inj Mdck Quad Pf 10/27/2016   Influenza Split 10/05/2011, 10/10/2012   Influenza, High Dose Seasonal PF 09/04/2014, 09/01/2015, 10/05/2017, 10/10/2018   Influenza-Unspecified 10/12/2013, 08/12/2014, 09/04/2014   PFIZER(Purple Top)SARS-COV-2 Vaccination 02/06/2020, 03/03/2020, 11/11/2020   Pneumococcal Conjugate-13 03/26/2015   Pneumococcal Polysaccharide-23 10/11/1999, 04/10/2012   Td 08/23/1999   Tdap 05/20/2019   Tetanus 05/28/2019   Zoster, Live 09/26/2008    Past Medical History:  Diagnosis Date   Asthma    Bradycardia    Cervical cancer ([place]) 1978   Chronic sinus infection    Ejection fraction    a. EF 55-65%, echo, mild LVH, June, 2013, small pericardial effusion   Fracture, sacrum/coccyx ([place])    History of kidney stones    Hyperlipidemia    a. takes Simvastatin   Hypertension    a. takes Metoprolol, Lisinopril, and amlodipine.   Low back pain    Mild dementia (HCC)    Nasal  polyps    Normal nuclear stress test    a. 07/2005 Nl nuc stress test.   Osteopenia    Osteoporosis    Palpitations    a. June 2013:  Event recorder normal sinus rhythm with rare PVCs - ? symptomatic PVC's.   Parkinson disease ([place])    Pericardial effusion    a. Small, echo, June, 2013   PONV (postoperative nausea and vomiting)     Tobacco History: Social History   Tobacco Use  Smoking Status Never  Smokeless Tobacco Never   Counseling given: Not Answered   Outpatient  Medications Prior to Visit  Medication Sig Dispense Refill   acetaminophen (TYLENOL) 325 MG tablet Take 1-2 tablets (325-650 mg total) by mouth every 6 (six) hours as needed for mild pain (pain score 1-3 or temp > 100.5). 60 tablet 1   amLODipine (NORVASC) 5 MG tablet Take 1 tablet (5 mg total) by mouth daily. 90 tablet 3   busPIRone (BUSPAR) 10 MG tablet Take 10 mg by mouth 2 (two) times daily.      Calcium Carb-Cholecalciferol (CALCIUM 600+D3) 600-800 MG-UNIT TABS Take 1 tablet by mouth daily.     citalopram (CELEXA) 40 MG tablet Take 40 mg by mouth daily.     docusate sodium (COLACE) 100 MG capsule Take 1 capsule (100 mg total) by mouth 2 (two) times daily. 10 capsule 0   fexofenadine (ALLEGRA) 180 MG tablet Take 180 mg by mouth daily.     LORazepam (ATIVAN) 0.5 MG tablet Take 1 tablet (0.5 mg total) by mouth 3 (three) times daily as needed for anxiety. 90 tablet 5   primidone (MYSOLINE) 50 MG tablet Take 50 mg by mouth at bedtime.     primidone (MYSOLINE) 50 MG tablet Take by mouth.     rosuvastatin (CRESTOR) 10 MG tablet TAKE 1 TABLET EVERY DAY (Patient taking differently: Take 10 mg by mouth daily.) 90 tablet 2   traZODone (DESYREL) 100 MG tablet Take 100 mg by mouth at bedtime.     vitamin B-12 (CYANOCOBALAMIN) 1000 MCG tablet Take 1,000 mcg by mouth daily.     enoxaparin (LOVENOX) 40 MG/0.4ML injection Inject 0.4 mLs (40 mg total) into the skin daily for 21 days. 8.4 mL 0   Molnupiravir 200 MG CAPS Take 4 capsules by mouth two times daily for 5 days 40 capsule 0   potassium chloride (KLOR-CON) 10 MEQ tablet Take 2 tablets (20 mEq total) by mouth daily for 5 days. 10 tablet 0   traMADol (ULTRAM) 50 MG tablet tramadol 50 mg tablet  Take 1 tablet every 6-8 hours by oral route.     No facility-administered medications prior to visit.     Review of Systems:   Constitutional:   No  weight loss, night sweats,  Fevers, chills,  +fatigue, or  lassitude.  HEENT:   No headaches,   Difficulty swallowing,  Tooth/dental problems, or  Sore throat,                No sneezing, itching, ear ache, nasal congestion, post nasal drip,   CV:  No chest pain,  Orthopnea, PND, swelling in lower extremities, anasarca, dizziness, palpitations, syncope.   GI  No heartburn, indigestion, abdominal pain, nausea, vomiting, diarrhea, change in bowel habits, loss of appetite, bloody stools.   Resp:    No chest wall deformity  Skin: no rash or lesions.  GU: no dysuria, change in color of urine, no urgency or frequency.  No flank pain,  no hematuria   MS:  No joint pain or swelling.  No decreased range of motion.  No back pain.    Physical Exam  BP 110/70 (BP Location: Left Arm, Patient Position: Sitting, Cuff Size: Normal)   Pulse 70   Temp 98.1 F (36.7 C) (Oral)   Ht '5\' 7"'$  (1.702 m)   Wt 173 lb 9.6 oz (78.7 kg)   SpO2 98%   BMI 27.19 kg/m   GEN: A/Ox3; pleasant , NAD, well nourished    HEENT:  [place]/AT,  , NOSE-clear, THROAT-clear, no lesions, no postnasal drip or exudate noted.   NECK:  Supple w/ fair ROM; no JVD; normal carotid impulses w/o bruits; no thyromegaly or nodules palpated; no lymphadenopathy.    RESP  Clear  P & A; w/o, wheezes/ rales/ or rhonchi. no accessory muscle use, no dullness to percussion  CARD:  RRR, no m/r/g, tr  peripheral edema, pulses intact, no cyanosis or clubbing.  GI:   Soft & nt; nml bowel sounds; no organomegaly or masses detected.   Musco: Warm bil, no deformities or joint swelling noted.   Neuro: alert, no focal deficits noted.    Skin: Warm, no lesions or rashes    Lab Results:  CBC    Component Value Date/Time   WBC 7.2 04/14/2021 0937   WBC 8.7 04/04/2021 1456   RBC 4.73 04/14/2021 0937   RBC 4.26 04/04/2021 1456   HGB 13.3 04/14/2021 0937   HCT 41.6 04/14/2021 0937   PLT 386 04/14/2021 0937   MCV 88 04/14/2021 0937   MCV 90 10/28/2014 1755   MCH 28.1 04/14/2021 0937   MCH 28.9 04/04/2021 1456   MCHC 32.0 04/14/2021  0937   MCHC 33.0 04/04/2021 1456   RDW 12.7 04/14/2021 0937   RDW 12.2 10/28/2014 1755   LYMPHSABS 1.7 04/14/2021 0937   LYMPHSABS 3.3 10/28/2014 1755   MONOABS 0.9 02/26/2021 0907   MONOABS 1.2 (H) 10/28/2014 1755   EOSABS 0.2 04/14/2021 0937   EOSABS 0.4 10/28/2014 1755   BASOSABS 0.1 04/14/2021 0937   BASOSABS 0.1 10/28/2014 1755    BMET    Component Value Date/Time   NA 141 04/14/2021 0937   NA 136 10/28/2014 1755   K 4.0 04/14/2021 0937   K 4.1 10/28/2014 1755   CL 101 04/14/2021 0937   CL 102 10/28/2014 1755   CO2 26 04/14/2021 0937   CO2 34 (H) 10/28/2014 1755   GLUCOSE 102 (H) 04/14/2021 0937   GLUCOSE 91 04/04/2021 1456   GLUCOSE 107 (H) 10/28/2014 1755   BUN 14 04/14/2021 0937   BUN 16 10/28/2014 1755   CREATININE 0.94 04/14/2021 0937   CREATININE 1.02 (H) 11/23/2017 1435   CALCIUM 9.7 04/14/2021 0937   CALCIUM 9.1 10/28/2014 1755   GFRNONAA >60 04/04/2021 1456   GFRNONAA 55 (L) 11/23/2017 1435   GFRAA 72 08/27/2020 1502   GFRAA 64 11/23/2017 1435    BNP    Component Value Date/Time   BNP 107.5 (H) 04/04/2021 1456    ProBNP No results found for: PROBNP  Imaging: No results found.    PFT Results Latest Ref Rng & Units 07/14/2016  FVC-Pre L 2.56  FVC-Predicted Pre % 76  FVC-Post L 2.47  FVC-Predicted Post % 73  Pre FEV1/FVC % % 81  Post FEV1/FCV % % 83  FEV1-Pre L 2.06  FEV1-Predicted Pre % 81  FEV1-Post L 2.06  DLCO uncorrected ml/min/mmHg 23.35  DLCO UNC% % 82  DLVA Predicted %  64    No results found for: NITRICOXIDE      Assessment & Plan:   Asthma Possible asthma flare - off maintenance medications  Restart ICS/LABA  Check PFT on return  Check chest xray today   Plan  Patient Instructions  Chest xray today .  Begin BREO 1 puff daily, rinse after use.  Albuterol inhaler As needed   Follow up with with Dr. Patsey Berthold or Katherine Tout NP in 6-8 weeks PFT  Please contact office for sooner follow up if symptoms do not improve or  worsen or seek emergency care         Dyspnea Chronic dyspnea ? Etiology  Check chest xray and PFT  CT chest 03/2021 mild apical scarring , no PE or nodules  Echo 2021 mild DD, EF ok. nml PAP   Plan  Patient Instructions  Chest xray today .  Begin BREO 1 puff daily, rinse after use.  Albuterol inhaler As needed   Follow up with with Dr. Patsey Berthold or Leita Lindbloom NP in 6-8 weeks PFT  Please contact office for sooner follow up if symptoms do not improve or worsen or seek emergency care           Rexene Edison, NP 07/02/2021

## 2021-07-02 NOTE — Progress Notes (Signed)
Agree with the details of the visit as noted by Tammy Parrett, NP.  C. Laura Wylene Weissman, MD [facility] 

## 2021-07-02 NOTE — Patient Instructions (Addendum)
Chest xray today .  Begin BREO 1 puff daily, rinse after use.  Albuterol inhaler As needed   Follow up with with Dr. Patsey Berthold or Aariz Maish NP in 6-8 weeks PFT  Please contact office for sooner follow up if symptoms do not improve or worsen or seek emergency care

## 2021-07-06 ENCOUNTER — Telehealth: Payer: Self-pay

## 2021-07-06 NOTE — Telephone Encounter (Signed)
Memory Dance is not covered by patients insurance.  Alternatives include: advair, flovent, fluticasone furoate-vilanterol, spiriva and trelegy.  Please advise if change is appropriate or to proceed with prior authorization.

## 2021-07-06 NOTE — Telephone Encounter (Signed)
fluticasone furoate-vilanterol-this is Breo not sure if they are counting as a generic but if you want to do that if not then she will need to do Advair 100 1 puff twice daily rinse after use

## 2021-07-07 ENCOUNTER — Encounter: Payer: Self-pay | Admitting: *Deleted

## 2021-07-07 MED ORDER — FLUTICASONE-SALMETEROL 100-50 MCG/ACT IN AEPB: 1.0000 | INHALATION_SPRAY | Freq: Two times a day (BID) | RESPIRATORY_TRACT | 11 refills | Status: DC

## 2021-07-07 NOTE — Telephone Encounter (Signed)
Spoke to Nancy Blackburn with [facility] and verified that Memory Dance is not covered.  Patient is aware of change and is agreeable with switching to advair.  Rx has been sent to preferred pharmacy. Nothing further needed.

## 2021-07-09 DIAGNOSIS — Z96651 Presence of right artificial knee joint: Secondary | ICD-10-CM | POA: Diagnosis not present

## 2021-07-15 DIAGNOSIS — Z96651 Presence of right artificial knee joint: Secondary | ICD-10-CM | POA: Diagnosis not present

## 2021-07-18 DIAGNOSIS — Z20822 Contact with and (suspected) exposure to covid-19: Secondary | ICD-10-CM | POA: Diagnosis not present

## 2021-07-18 DIAGNOSIS — J069 Acute upper respiratory infection, unspecified: Secondary | ICD-10-CM | POA: Diagnosis not present

## 2021-07-18 DIAGNOSIS — J45909 Unspecified asthma, uncomplicated: Secondary | ICD-10-CM | POA: Diagnosis not present

## 2021-07-21 ENCOUNTER — Other Ambulatory Visit: Payer: Self-pay

## 2021-07-21 ENCOUNTER — Ambulatory Visit (INDEPENDENT_AMBULATORY_CARE_PROVIDER_SITE_OTHER): Payer: Medicare Other | Admitting: Family Medicine

## 2021-07-21 ENCOUNTER — Encounter: Payer: Self-pay | Admitting: Family Medicine

## 2021-07-21 VITALS — BP 137/83 | HR 83 | Temp 98.2°F | Resp 16 | Wt 175.0 lb

## 2021-07-21 DIAGNOSIS — J4521 Mild intermittent asthma with (acute) exacerbation: Secondary | ICD-10-CM

## 2021-07-21 DIAGNOSIS — R059 Cough, unspecified: Secondary | ICD-10-CM

## 2021-07-21 DIAGNOSIS — J0140 Acute pansinusitis, unspecified: Secondary | ICD-10-CM

## 2021-07-21 DIAGNOSIS — J301 Allergic rhinitis due to pollen: Secondary | ICD-10-CM

## 2021-07-21 NOTE — Progress Notes (Signed)
I,[name],acting as a scribe for Wilhemena Durie, MD.,have documented all relevant documentation on the behalf of Wilhemena Durie, MD,as directed by  Wilhemena Durie, MD while in the presence of Wilhemena Durie, MD.   Established patient visit   [name]   DOB: 11-28-1946   75 y.o. Female  MRN: [id number] Visit Date: 07/21/2021  Today's healthcare provider: Wilhemena Durie, MD   No chief complaint on file.  Subjective    HPI  Patient was seen in urgent care center for symptoms and acute cough.  Was treated with Z-Pak and a prednisone taper and feels much better.  Thing is fine now.  No fever Follow up for bronchitis  The patient was last seen for this 4 days ago. Changes made at last visit include; patient was seen for cough and sinus symptoms. Patient was seen at [facility] 07/18/2021. Patient was given prednisone and zpack.  She reports good compliance with treatment. She feels that condition is Unchanged. She is not having side effects. none  -----------------------------------------------------------------------------------------       Medications: Outpatient Medications Prior to Visit  Medication Sig   acetaminophen (TYLENOL) 325 MG tablet Take 1-2 tablets (325-650 mg total) by mouth every 6 (six) hours as needed for mild pain (pain score 1-3 or temp > 100.5).   albuterol (VENTOLIN HFA) 108 (90 Base) MCG/ACT inhaler Inhale 1-2 puffs into the lungs every 6 (six) hours as needed for wheezing or shortness of breath.   amLODipine (NORVASC) 5 MG tablet Take 1 tablet (5 mg total) by mouth daily.   busPIRone (BUSPAR) 10 MG tablet Take 10 mg by mouth 2 (two) times daily.    Calcium Carb-Cholecalciferol (CALCIUM 600+D3) 600-800 MG-UNIT TABS Take 1 tablet by mouth daily.   citalopram (CELEXA) 40 MG tablet Take 40 mg by mouth daily.   docusate sodium (COLACE) 100 MG capsule Take 1 capsule (100 mg total) by mouth 2 (two) times daily.    fexofenadine (ALLEGRA) 180 MG tablet Take 180 mg by mouth daily.   fluticasone-salmeterol (ADVAIR DISKUS) 100-50 MCG/ACT AEPB Inhale 1 puff into the lungs 2 (two) times daily.   LORazepam (ATIVAN) 0.5 MG tablet Take 1 tablet (0.5 mg total) by mouth 3 (three) times daily as needed for anxiety.   primidone (MYSOLINE) 50 MG tablet Take by mouth.   rosuvastatin (CRESTOR) 10 MG tablet TAKE 1 TABLET EVERY DAY (Patient taking differently: Take 10 mg by mouth daily.)   traZODone (DESYREL) 100 MG tablet Take 100 mg by mouth at bedtime.   vitamin B-12 (CYANOCOBALAMIN) 1000 MCG tablet Take 1,000 mcg by mouth daily.   [DISCONTINUED] enoxaparin (LOVENOX) 40 MG/0.4ML injection Inject 0.4 mLs (40 mg total) into the skin daily for 21 days.   [DISCONTINUED] Molnupiravir 200 MG CAPS Take 4 capsules by mouth two times daily for 5 days   [DISCONTINUED] potassium chloride (KLOR-CON) 10 MEQ tablet Take 2 tablets (20 mEq total) by mouth daily for 5 days.   [DISCONTINUED] primidone (MYSOLINE) 50 MG tablet Take 50 mg by mouth at bedtime.   [DISCONTINUED] traMADol (ULTRAM) 50 MG tablet tramadol 50 mg tablet  Take 1 tablet every 6-8 hours by oral route.   No facility-administered medications prior to visit.    Review of Systems      Objective    BP 137/83 (BP Location: Left Arm, Patient Position: Sitting, Cuff Size: Normal)   Pulse 83   Temp 98.2 F (36.8 C) (Oral)  Resp 16   Wt 175 lb (79.4 kg)   SpO2 96%   BMI 27.41 kg/m  BP Readings from Last 3 Encounters:  07/29/21 117/78  07/21/21 137/83  07/02/21 110/70   Wt Readings from Last 3 Encounters:  07/29/21 175 lb (79.4 kg)  07/21/21 175 lb (79.4 kg)  07/02/21 173 lb 9.6 oz (78.7 kg)       Physical Exam Vitals reviewed.  Constitutional:      Appearance: Normal appearance. She is well-developed.     Comments: Patient appears well.  This is the best she has looked in several years.  HENT:     Head: Normocephalic and atraumatic.     Right Ear:  External ear normal.     Left Ear: External ear normal.     Nose: Nose normal.  Eyes:     General: No scleral icterus.    Conjunctiva/sclera: Conjunctivae normal.  Neck:     Thyroid: No thyromegaly.  Cardiovascular:     Rate and Rhythm: Normal rate and regular rhythm.     Heart sounds: Normal heart sounds.  Pulmonary:     Effort: Pulmonary effort is normal.     Breath sounds: Normal breath sounds.  Chest:  Breasts:    Breasts are symmetrical.     Right: Normal.     Left: Normal.  Abdominal:     Palpations: Abdomen is soft.  Skin:    General: Skin is warm and dry.  Neurological:     Mental Status: She is alert and oriented to person, place, and time. Mental status is at baseline.  Psychiatric:        Mood and Affect: Mood normal.        Behavior: Behavior normal.        Thought Content: Thought content normal.        Judgment: Judgment normal.     Comments: Slightly anxious today.      No results found for any visits on 07/21/21.  Assessment & Plan     1. Mild intermittent asthma with acute exacerbation The patient improved on prednisone and Z-Pak.  Inhalers continued.  2. Subacute pansinusitis Responded nicely to Z-Pak  3. Cough Continue to use Robitussin and extra fluids  4. Allergic rhinitis due to pollen, unspecified seasonality    No follow-ups on file.      I, Wilhemena Durie, MD, have reviewed all documentation for this visit. The documentation on 07/30/21 for the exam, diagnosis, procedures, and orders are all accurate and complete.    Roxanna Mcever Cranford Mon, MD  [facility] [phone] (phone) [phone] (fax)  [place]

## 2021-07-26 DIAGNOSIS — F5105 Insomnia due to other mental disorder: Secondary | ICD-10-CM | POA: Diagnosis not present

## 2021-07-26 DIAGNOSIS — F331 Major depressive disorder, recurrent, moderate: Secondary | ICD-10-CM | POA: Diagnosis not present

## 2021-07-26 DIAGNOSIS — F411 Generalized anxiety disorder: Secondary | ICD-10-CM | POA: Diagnosis not present

## 2021-07-28 DIAGNOSIS — Z96651 Presence of right artificial knee joint: Secondary | ICD-10-CM | POA: Diagnosis not present

## 2021-07-29 ENCOUNTER — Ambulatory Visit (INDEPENDENT_AMBULATORY_CARE_PROVIDER_SITE_OTHER): Payer: Medicare Other | Admitting: Family Medicine

## 2021-07-29 ENCOUNTER — Other Ambulatory Visit: Payer: Self-pay

## 2021-07-29 ENCOUNTER — Ambulatory Visit
Admission: RE | Admit: 2021-07-29 | Discharge: 2021-07-29 | Disposition: A | Discharge status: Home / Self Care | Payer: Medicare Other | Attending: Family Medicine | Admitting: Family Medicine

## 2021-07-29 ENCOUNTER — Encounter: Payer: Self-pay | Admitting: Family Medicine

## 2021-07-29 ENCOUNTER — Ambulatory Visit
Admission: RE | Admit: 2021-07-29 | Discharge: 2021-07-29 | Disposition: A | Discharge status: Home / Self Care | Payer: Medicare Other | Source: Ambulatory Visit | Attending: Family Medicine | Admitting: Family Medicine

## 2021-07-29 VITALS — BP 117/78 | HR 64 | Temp 99.4°F | Resp 24 | Wt 175.0 lb

## 2021-07-29 DIAGNOSIS — F411 Generalized anxiety disorder: Secondary | ICD-10-CM

## 2021-07-29 DIAGNOSIS — R0602 Shortness of breath: Secondary | ICD-10-CM | POA: Insufficient documentation

## 2021-07-29 DIAGNOSIS — J4521 Mild intermittent asthma with (acute) exacerbation: Secondary | ICD-10-CM

## 2021-07-29 DIAGNOSIS — R079 Chest pain, unspecified: Secondary | ICD-10-CM

## 2021-07-29 DIAGNOSIS — F3341 Major depressive disorder, recurrent, in partial remission: Secondary | ICD-10-CM

## 2021-07-29 DIAGNOSIS — E538 Deficiency of other specified B group vitamins: Secondary | ICD-10-CM

## 2021-07-29 DIAGNOSIS — R531 Weakness: Secondary | ICD-10-CM | POA: Diagnosis not present

## 2021-07-29 MED ORDER — DOXYCYCLINE HYCLATE 100 MG PO TABS: 100.0000 mg | ORAL_TABLET | Freq: Two times a day (BID) | ORAL | 0 refills | Status: AC

## 2021-07-29 MED ORDER — PREDNISONE 20 MG PO TABS: 20.0000 mg | ORAL_TABLET | Freq: Every day | ORAL | 0 refills | Status: AC

## 2021-07-29 MED ORDER — METHYLPREDNISOLONE ACETATE 40 MG/ML IJ SUSP: 40.0000 mg | Freq: Once | INTRAMUSCULAR | Status: DC

## 2021-07-29 NOTE — Progress Notes (Signed)
Established patient visit   [name]   DOB: 07-19-1946   75 y.o. Female  MRN: [id number] Visit Date: 07/29/2021  Today's healthcare provider: Wilhemena Durie, MD   Chief Complaint  Patient presents with   Chest Pain   Shortness of Breath   Subjective  -------------------------------------------------------------------------------------------------------------------- Shortness of Breath The current episode started in the past 7 days. The problem has been gradually worsening. Associated symptoms include chest pain, PND and wheezing. Pertinent negatives include no fever, headaches, sputum production or syncope. The symptoms are aggravated by lying flat. She has tried OTC cough suppressants and steroid inhalers for the symptoms. The treatment provided mild relief.   Patient feels chest tightness and dyspnea but no chest pain.  No palpitations.  No slow or fast heart rate.  This is most like the feeling she had when she was a weighted in urgent care recently. She has some cough that is persistent.  No PND or orthopnea.      Medications: Outpatient Medications Prior to Visit  Medication Sig   acetaminophen (TYLENOL) 325 MG tablet Take 1-2 tablets (325-650 mg total) by mouth every 6 (six) hours as needed for mild pain (pain score 1-3 or temp > 100.5).   albuterol (VENTOLIN HFA) 108 (90 Base) MCG/ACT inhaler Inhale 1-2 puffs into the lungs every 6 (six) hours as needed for wheezing or shortness of breath.   amLODipine (NORVASC) 5 MG tablet Take 1 tablet (5 mg total) by mouth daily.   busPIRone (BUSPAR) 10 MG tablet Take 10 mg by mouth 2 (two) times daily.    Calcium Carb-Cholecalciferol (CALCIUM 600+D3) 600-800 MG-UNIT TABS Take 1 tablet by mouth daily.   citalopram (CELEXA) 40 MG tablet Take 40 mg by mouth daily.   docusate sodium (COLACE) 100 MG capsule Take 1 capsule (100 mg total) by mouth 2 (two) times daily.   fexofenadine (ALLEGRA) 180 MG tablet Take 180  mg by mouth daily.   fluticasone-salmeterol (ADVAIR DISKUS) 100-50 MCG/ACT AEPB Inhale 1 puff into the lungs 2 (two) times daily.   LORazepam (ATIVAN) 0.5 MG tablet Take 1 tablet (0.5 mg total) by mouth 3 (three) times daily as needed for anxiety.   primidone (MYSOLINE) 50 MG tablet Take by mouth.   rosuvastatin (CRESTOR) 10 MG tablet TAKE 1 TABLET EVERY DAY (Patient taking differently: Take 10 mg by mouth daily.)   traZODone (DESYREL) 100 MG tablet Take 100 mg by mouth at bedtime.   vitamin B-12 (CYANOCOBALAMIN) 1000 MCG tablet Take 1,000 mcg by mouth daily.   No facility-administered medications prior to visit.    Review of Systems  Constitutional:  Negative for fever.  Respiratory:  Positive for shortness of breath and wheezing. Negative for sputum production.   Cardiovascular:  Positive for chest pain and PND. Negative for syncope.  Neurological:  Negative for headaches.       Objective  -------------------------------------------------------------------------------------------------------------------- BP 117/78   Pulse 64   Temp 99.4 F (37.4 C)   Resp (!) 24   Wt 175 lb (79.4 kg)   SpO2 98%   BMI 27.41 kg/m  BP Readings from Last 3 Encounters:  07/29/21 117/78  07/21/21 137/83  07/02/21 110/70   Wt Readings from Last 3 Encounters:  07/29/21 175 lb (79.4 kg)  07/21/21 175 lb (79.4 kg)  07/02/21 173 lb 9.6 oz (78.7 kg)       Physical Exam Vitals reviewed.  Constitutional:      Appearance: Normal appearance. She is  well-developed.     Comments: Patient appears well.  This is the best she has looked in several years.  HENT:     Head: Normocephalic and atraumatic.     Right Ear: External ear normal.     Left Ear: External ear normal.     Nose: Nose normal.  Eyes:     General: No scleral icterus.    Conjunctiva/sclera: Conjunctivae normal.  Neck:     Thyroid: No thyromegaly.  Cardiovascular:     Rate and Rhythm: Normal rate and regular rhythm.     Heart  sounds: Normal heart sounds.  Pulmonary:     Effort: Pulmonary effort is normal.     Breath sounds: Normal breath sounds.     Comments: Patient is mildly tachypneic. Chest:  Breasts:    Breasts are symmetrical.     Right: Normal.     Left: Normal.  Abdominal:     Palpations: Abdomen is soft.  Skin:    General: Skin is warm and dry.  Neurological:     Mental Status: She is alert and oriented to person, place, and time. Mental status is at baseline.  Psychiatric:        Mood and Affect: Mood normal.        Behavior: Behavior normal.        Thought Content: Thought content normal.        Judgment: Judgment normal.     Comments: Slightly anxious today.    ECG is normal sinus rhythm at 74 with nonspecific ST-T wave changes.  No results found for any visits on 07/29/21.  Assessment & Plan  ---------------------------------------------------------------------------------------------------------------------- 1. Chest pain, unspecified type I think this is pulmonary and not cardiac.  At this time will treat with prednisone and doxycycline for the next 5 days.  May need further work-up but recently had normal CT of chest - EKG 12-Lead - DG Chest 2 View; Future - Pro b natriuretic peptide (BNP)[facility]) - CBC with Differential/Platelet - Comprehensive metabolic panel - predniSONE (DELTASONE) 20 MG tablet; Take 1 tablet (20 mg total) by mouth daily with breakfast for 5 days.  Dispense: 5 tablet; Refill: 0 - doxycycline (VIBRA-TABS) 100 MG tablet; Take 1 tablet (100 mg total) by mouth 2 (two) times daily for 5 days.  Dispense: 10 tablet; Refill: 0 - Troponin T  2. Shortness of breath Recent normal CT of chest. - DG Chest 2 View; Future - Pro b natriuretic peptide (BNP)[facility]) - CBC with Differential/Platelet - Comprehensive metabolic panel - predniSONE (DELTASONE) 20 MG tablet; Take 1 tablet (20 mg total) by mouth daily with  breakfast for 5 days.  Dispense: 5 tablet; Refill: 0 - doxycycline (VIBRA-TABS) 100 MG tablet; Take 1 tablet (100 mg total) by mouth 2 (two) times daily for 5 days.  Dispense: 10 tablet; Refill: 0 - methylPREDNISolone acetate (DEPO-MEDROL) injection 40 mg - Troponin T  3. Mild intermittent asthma with acute exacerbation  4. Weakness generalized Chronic and stabl  5. Anxiety, generalized   6. B12 deficiency Chronic and improved in general.  7. Depression, major, recurrent, in partial remission ([place]) Clinically in partial remission.   No follow-ups on file.      I, Wilhemena Durie, MD, have reviewed all documentation for this visit. The documentation on 08/07/21 for the exam, diagnosis, procedures, and orders are all accurate and complete.    Tariq Pernell Cranford Mon, MD  [facility] [phone] (phone) [phone] (fax)  [facility]

## 2021-07-30 LAB — CBC WITH DIFFERENTIAL/PLATELET
Basophils Absolute: 0.1 10*3/uL (ref 0.0–0.2)
Basos: 1 %
EOS (ABSOLUTE): 0.2 10*3/uL (ref 0.0–0.4)
Eos: 2 %
Hematocrit: 43.2 % (ref 34.0–46.6)
Hemoglobin: 13.9 g/dL (ref 11.1–15.9)
Immature Grans (Abs): 0 10*3/uL (ref 0.0–0.1)
Immature Granulocytes: 0 %
Lymphocytes Absolute: 2.4 10*3/uL (ref 0.7–3.1)
Lymphs: 26 %
MCH: 28.5 pg (ref 26.6–33.0)
MCHC: 32.2 g/dL (ref 31.5–35.7)
MCV: 89 fL (ref 79–97)
Monocytes Absolute: 1 10*3/uL — ABNORMAL HIGH (ref 0.1–0.9)
Monocytes: 11 %
Neutrophils Absolute: 5.4 10*3/uL (ref 1.4–7.0)
Neutrophils: 60 %
Platelets: 302 10*3/uL (ref 150–450)
RBC: 4.88 x10E6/uL (ref 3.77–5.28)
RDW: 13.6 % (ref 11.7–15.4)
WBC: 9.1 10*3/uL (ref 3.4–10.8)

## 2021-07-30 LAB — COMPREHENSIVE METABOLIC PANEL
ALT: 12 IU/L (ref 0–32)
AST: 17 IU/L (ref 0–40)
Albumin/Globulin Ratio: 1.6 (ref 1.2–2.2)
Albumin: 3.8 g/dL (ref 3.7–4.7)
Alkaline Phosphatase: 126 IU/L — ABNORMAL HIGH (ref 44–121)
BUN/Creatinine Ratio: 15 (ref 12–28)
BUN: 13 mg/dL (ref 8–27)
Bilirubin Total: <0.2 mg/dL (ref 0.0–1.2)
CO2: 25 mmol/L (ref 20–29)
Calcium: 9.5 mg/dL (ref 8.7–10.3)
Chloride: 103 mmol/L (ref 96–106)
Creatinine, Ser: 0.84 mg/dL (ref 0.57–1.00)
Globulin, Total: 2.4 g/dL (ref 1.5–4.5)
Glucose: 82 mg/dL (ref 65–99)
Potassium: 4.4 mmol/L (ref 3.5–5.2)
Sodium: 143 mmol/L (ref 134–144)
Total Protein: 6.2 g/dL (ref 6.0–8.5)
eGFR: 72 mL/min/{1.73_m2} (ref >59)

## 2021-07-30 LAB — TROPONIN T: Troponin T (Highly Sensitive): 10 ng/L (ref 0–14)

## 2021-07-30 LAB — PRO B NATRIURETIC PEPTIDE: NT-Pro BNP: 293 pg/mL (ref 0–738)

## 2021-08-03 ENCOUNTER — Telehealth: Payer: Self-pay

## 2021-08-03 NOTE — Telephone Encounter (Signed)
Pt given lab results per notes of Dr. Rosanna Randy on 08/02/21. Pt verbalized understanding.   Richard Maceo Pro., MD  08/02/2021 10:55 AM EDT     Labs stable. Please advise patient.

## 2021-08-04 ENCOUNTER — Ambulatory Visit: Payer: Medicare Other | Admitting: Family Medicine

## 2021-08-04 DIAGNOSIS — Z96651 Presence of right artificial knee joint: Secondary | ICD-10-CM | POA: Diagnosis not present

## 2021-08-13 DIAGNOSIS — Z96651 Presence of right artificial knee joint: Secondary | ICD-10-CM | POA: Diagnosis not present

## 2021-08-24 ENCOUNTER — Other Ambulatory Visit: Payer: Self-pay

## 2021-08-24 ENCOUNTER — Ambulatory Visit (INDEPENDENT_AMBULATORY_CARE_PROVIDER_SITE_OTHER): Payer: Medicare Other | Admitting: Pulmonary Disease

## 2021-08-24 ENCOUNTER — Other Ambulatory Visit
Admission: RE | Admit: 2021-08-24 | Discharge: 2021-08-24 | Disposition: A | Discharge status: Home / Self Care | Payer: Medicare Other | Attending: Pulmonary Disease | Admitting: Pulmonary Disease

## 2021-08-24 ENCOUNTER — Encounter: Payer: Self-pay | Admitting: Pulmonary Disease

## 2021-08-24 VITALS — BP 120/74 | HR 97 | Temp 97.9°F | Ht 67.0 in | Wt 174.8 lb

## 2021-08-24 DIAGNOSIS — Z825 Family history of asthma and other chronic lower respiratory diseases: Secondary | ICD-10-CM | POA: Diagnosis not present

## 2021-08-24 DIAGNOSIS — R0602 Shortness of breath: Secondary | ICD-10-CM | POA: Diagnosis not present

## 2021-08-24 DIAGNOSIS — R011 Cardiac murmur, unspecified: Secondary | ICD-10-CM | POA: Diagnosis not present

## 2021-08-24 NOTE — Patient Instructions (Signed)
We are going to get a blood test to check for hereditary emphysema.  We are also going to check breathing test and an echocardiogram which will tell us about your heart and the pressure on the artery going from the heart to the lungs.  We will also order an overnight oximetry this is a test that is done at home and you will get this sent to you so you can do it.  For now you may continue using the [facility].   We will see him in follow-up in 4 to 6 weeks time you will see me or the nurse practitioner at that time.  Currently I do not see need for additional prednisone or antibiotics.

## 2021-08-24 NOTE — Progress Notes (Signed)
Subjective:    Patient ID: Nancy Blackburn, female    DOB: [birth year], 75 y.o.   MRN: [id number] Chief Complaint  Patient presents with   Follow-up    Sob with exertion.    Requesting MD/Service: Miguel Aschoff Date of initial consultation: 06/10/16 by Dr. Merton Border Reason for consultation: Dyspnea   PT PROFILE: 75 y.o. F, lifelong never smoker, referred for evaluation of unexplained exertional dyspnea.   DATA: 04/14/16 Echocardiogram : LVEF 123456, grade 2 diastolic dysfunction, LA mildly dilated 04/15/15 Myoview : no evidence of ischemia 07/14/16 PFTs : Normal spirometry, mild restriction, normal DLCO 07/14/16 6 MWT : 336 meters. Limited by arthritic pain in knees > dyspnea 07/03/2020 echocardiogram: EF 60 to 65%, grade 1 DD, there was evidence of increased right ventricular pressure 09/15/2020 long-term monitor: Frequent PVCs/ventricular ectopy, symptomatic   Interim:  Patient has increasing shortness of breath, last seen at this clinic on 22 July by Rexene Edison, NP.  Previously followed by Dr. Merton Border, first visit with me.  HPI The patient is a 75 year old lifelong never smoker who presents for dyspnea on exertion.  Previously evaluated by Dr. Gwendolyn Lima.  It was believed for dyspnea was secondary to mild persistent asthma.  She was previously treated with [facility] with some effectiveness.  Recently she has been given a trial of Breztri 2 puffs twice a day by her primary care physician.  She states that she feels that this does help her some.  She had been treated with prednisone and antibiotics without significant effect.  Her dyspnea however still persistent particularly on exertion.  She has not noticed any wheezing.  No increased nasal congestion.  No fevers, chills or sweats.  She does have sensation of chest heaviness and tachypalpitations for which she follows with cardiology.  She had a CT angio chest on 04 April 2021 that showed increase in her PA caliber  indicating potential pulmonary hypertension.  CT did not show any PE.  Some very minor pleural-parenchymal scarring no interstitial lung disease.  There are no masses or nodules noted.  No bronchiectasis.  The patient does not endorse any other symptomatology.  She has been able to sleep well without nocturnal awakenings and feels refreshed in the morning.  Family history is significant for emphysema and the father that was due to "exposure to chemicals".  Per the patient he was never a heavy smoker.   Review of Systems A 10 point review of systems was performed and it is as noted above otherwise negative.  Past Medical History:  Diagnosis Date   Asthma    Bradycardia    Cervical cancer ([place]) 1978   Chronic sinus infection    Ejection fraction    a. EF 55-65%, echo, mild LVH, June, 2013, small pericardial effusion   Fracture, sacrum/coccyx ([place])    History of kidney stones    Hyperlipidemia    a. takes Simvastatin   Hypertension    a. takes Metoprolol, Lisinopril, and amlodipine.   Low back pain    Mild dementia (HCC)    Nasal polyps    Normal nuclear stress test    a. 07/2005 Nl nuc stress test.   Osteopenia    Osteoporosis    Palpitations    a. June 2013:  Event recorder normal sinus rhythm with rare PVCs - ? symptomatic PVC's.   Parkinson disease ([place])    Pericardial effusion    a. Small, echo, June, 2013   PONV (postoperative nausea and vomiting)  Past Surgical History:  Procedure Laterality Date   BACK SURGERY  08/2010   BLADDER SUSPENSION  2003   bone spur  2009   removed from right collar bone area and partial collar bone removed   CATARACT EXTRACTION W/ INTRAOCULAR LENS  IMPLANT, BILATERAL     EYE SURGERY     LUMBAR FUSION  2011, 2012   SHOULDER ARTHROSCOPY Right 2012   THYROIDECTOMY, PARTIAL  1976   TONSILLECTOMY     as child   TOTAL KNEE ARTHROPLASTY Right 03/09/2021   Procedure: Right TOTAL KNEE ARTHROPLASTY;  Surgeon: Thornton Park, MD;  Location:  [facility] ORS;  Service: Orthopedics;  Laterality: Right;   TUBAL LIGATION  1977   VAGINAL HYSTERECTOMY  1978   d/t cervical cancer   Family History  Problem Relation Age of Onset   Colon cancer Mother    Heart murmur Mother    Heart disease Mother    Angina Mother    Heart disease Father    COPD Father    Atrial fibrillation Brother    Cushing syndrome Daughter    Cancer Daughter        thyroid   Breast cancer Cousin    Hypotension Neg Hx    Malignant hyperthermia Neg Hx    Pseudochol deficiency Neg Hx    Social History   Tobacco Use   Smoking status: Never   Smokeless tobacco: Never  Substance Use Topics   Alcohol use: Yes    Alcohol/week: 0.0 standard drinks    Comment: rarely   Allergies  Allergen Reactions   Anti-Inflammatory Enzyme [Nutritional Supplements] Other (See Comments)    REACTION: Due to bleeding ulcer   Cymbalta [Duloxetine Hcl] Other (See Comments)    REACTION: Urinary retention   Clarithromycin Other (See Comments)    Upsets stomach   Donepezil Hcl Other (See Comments)    Significant bradycardia causing syncope   Nsaids     GI BLEED   Ranitidine     severe diarrhea   Venlafaxine     severe depression   Current Meds  Medication Sig   acetaminophen (TYLENOL) 325 MG tablet Take 1-2 tablets (325-650 mg total) by mouth every 6 (six) hours as needed for mild pain (pain score 1-3 or temp > 100.5).   albuterol (VENTOLIN HFA) 108 (90 Base) MCG/ACT inhaler Inhale 1-2 puffs into the lungs every 6 (six) hours as needed for wheezing or shortness of breath.   amLODipine (NORVASC) 5 MG tablet Take 1 tablet (5 mg total) by mouth daily.   Budeson-Glycopyrrol-Formoterol (BREZTRI AEROSPHERE) 160-9-4.8 MCG/ACT AERO Inhale 2 puffs into the lungs 2 (two) times daily.   busPIRone (BUSPAR) 10 MG tablet Take 10 mg by mouth 2 (two) times daily.    Calcium Carb-Cholecalciferol (CALCIUM 600+D3) 600-800 MG-UNIT TABS Take 1 tablet by mouth daily.   citalopram (CELEXA) 40 MG  tablet Take 40 mg by mouth daily.   docusate sodium (COLACE) 100 MG capsule Take 1 capsule (100 mg total) by mouth 2 (two) times daily.   fexofenadine (ALLEGRA) 180 MG tablet Take 180 mg by mouth daily.   LORazepam (ATIVAN) 0.5 MG tablet Take 1 tablet (0.5 mg total) by mouth 3 (three) times daily as needed for anxiety.   rosuvastatin (CRESTOR) 10 MG tablet TAKE 1 TABLET EVERY DAY (Patient taking differently: Take 10 mg by mouth daily.)   traZODone (DESYREL) 100 MG tablet Take 100 mg by mouth at bedtime.   vitamin B-12 (CYANOCOBALAMIN) 1000 MCG tablet Take 1,000  mcg by mouth daily.   Immunization History  Administered Date(s) Administered   Fluad Quad(high Dose 65+) 10/21/2019, 10/14/2020   Influenza Inj Mdck Quad Pf 10/27/2016   Influenza Split 10/05/2011, 10/10/2012   Influenza, High Dose Seasonal PF 09/04/2014, 09/01/2015, 10/05/2017, 10/10/2018   Influenza-Unspecified 10/12/2013, 08/12/2014, 09/04/2014   PFIZER(Purple Top)SARS-COV-2 Vaccination 02/06/2020, 03/03/2020, 11/11/2020   Pneumococcal Conjugate-13 03/26/2015   Pneumococcal Polysaccharide-23 10/11/1999, 04/10/2012   Td 08/23/1999   Tdap 05/20/2019   Tetanus 05/28/2019   Zoster, Live 09/26/2008       Objective:   Physical Exam BP 120/74 (BP Location: Left Arm, Cuff Size: Normal)   Pulse 97   Temp 97.9 F (36.6 C) (Temporal)   Ht '5\' 7"'$  (1.702 m)   Wt 174 lb 12.8 oz (79.3 kg)   SpO2 95%   BMI 27.38 kg/m  GENERAL: Well-developed, well-nourished woman, no acute distress, mild tachypnea but no overt respiratory distress.  No conversational dyspnea per se.  Fully ambulatory. HEAD: Normocephalic, atraumatic.  EYES: Pupils equal, round, reactive to light.  No scleral icterus.  Mild periorbital edema MOUTH: Nose/mouth/throat not examined due to masking requirements for COVID 19. NECK: Supple. No thyromegaly. Trachea midline. No JVD.  No adenopathy. PULMONARY: Good air entry bilaterally.  No adventitious  sounds. CARDIOVASCULAR: S1 and S2. Regular rate and rhythm.  Grade 2/6 holosystolic murmur, left sternal border. ABDOMEN: Benign. MUSCULOSKELETAL: No joint deformity, no clubbing, no edema.  NEUROLOGIC: Mild resting tremor, no overt focal deficit, no gait disturbance. SKIN: Intact,warm,dry. PSYCH: Anxious, normal behavior.  Ambulatory oximetry was performed today, she maintained oxygen saturations at 96% throughout.     Assessment & Plan:     ICD-10-CM   1. Shortness of breath  R06.02 ECHOCARDIOGRAM COMPLETE    Alpha-1 antitrypsin phenotype    Pulmonary Function Test [facility] Only    Pulse oximetry, overnight   Previously attributed to asthma Query pulmonary hypertension by recent CT Reevaluate with PFTs, 2D echo    2. Heart murmur  R01.1 ECHOCARDIOGRAM COMPLETE   Reevaluate with 2D echo    3. Family history of emphysema  Z82.5    We will check alpha-1 for completeness     Orders Placed This Encounter  Procedures   Alpha-1 antitrypsin phenotype    Standing Status:   Future    Number of Occurrences:   1    Standing Expiration Date:   08/24/2022   Pulmonary Function Test [facility] Only    Standing Status:   Future    Standing Expiration Date:   08/24/2022    Scheduling Instructions:     Next available    Order Specific Question:   Full PFT: includes the following: basic spirometry, spirometry pre & post bronchodilator, diffusion capacity (DLCO), lung volumes    Answer:   Full PFT   Pulse oximetry, overnight    On roomair Dme:new start    Standing Status:   Future    Standing Expiration Date:   08/24/2022   ECHOCARDIOGRAM COMPLETE    Standing Status:   Future    Standing Expiration Date:   02/21/2022    Order Specific Question:   Where should this test be performed    Answer:   [facility]    Order Specific Question:   Perflutren DEFINITY (image enhancing agent) should be administered unless hypersensitivity or allergy exist    Answer:   Administer Perflutren    Order  Specific Question:   Reason for exam-Echo    Answer:   Murmur R01.1  Been encouraged to continue using Breztri for now.  We will see her in follow-up in 4 to 6 weeks time and we will regroup and review laboratory data ordered.  Currently I do not see need for further additional prednisone or antibiotics.  Renold Don, MD Advanced Bronchoscopy PCCM  [facility]    *This note was dictated using voice recognition software/Dragon.  Despite best efforts to proofread, errors can occur which can change the meaning.  Any change was purely unintentional.

## 2021-08-25 ENCOUNTER — Ambulatory Visit (INDEPENDENT_AMBULATORY_CARE_PROVIDER_SITE_OTHER): Payer: Medicare Other

## 2021-08-25 ENCOUNTER — Ambulatory Visit: Payer: Medicare Other

## 2021-08-25 ENCOUNTER — Other Ambulatory Visit
Admission: RE | Admit: 2021-08-25 | Discharge: 2021-08-25 | Disposition: A | Discharge status: Home / Self Care | Payer: Medicare Other | Source: Ambulatory Visit | Attending: Pulmonary Disease | Admitting: Pulmonary Disease

## 2021-08-25 DIAGNOSIS — Z23 Encounter for immunization: Secondary | ICD-10-CM

## 2021-08-25 DIAGNOSIS — Z20822 Contact with and (suspected) exposure to covid-19: Secondary | ICD-10-CM | POA: Diagnosis not present

## 2021-08-25 DIAGNOSIS — Z01812 Encounter for preprocedural laboratory examination: Secondary | ICD-10-CM | POA: Diagnosis not present

## 2021-08-25 DIAGNOSIS — Z96651 Presence of right artificial knee joint: Secondary | ICD-10-CM | POA: Diagnosis not present

## 2021-08-25 LAB — SARS CORONAVIRUS 2 (TAT 6-24 HRS): SARS Coronavirus 2: NEGATIVE

## 2021-08-26 ENCOUNTER — Ambulatory Visit: Payer: Medicare Other | Attending: Pulmonary Disease

## 2021-08-26 ENCOUNTER — Other Ambulatory Visit: Payer: Self-pay

## 2021-08-26 DIAGNOSIS — R0602 Shortness of breath: Secondary | ICD-10-CM | POA: Insufficient documentation

## 2021-08-26 LAB — MISC LABCORP TEST (SEND OUT): Labcorp test code: 95653

## 2021-09-07 DIAGNOSIS — G4730 Sleep apnea, unspecified: Secondary | ICD-10-CM | POA: Diagnosis not present

## 2021-09-07 DIAGNOSIS — R0683 Snoring: Secondary | ICD-10-CM | POA: Diagnosis not present

## 2021-09-23 ENCOUNTER — Telehealth: Payer: Self-pay

## 2021-09-23 NOTE — Telephone Encounter (Signed)
Called and LVM in regards to ONO results. Per Dr Patsey Berthold patient does not need O2.

## 2021-09-24 NOTE — Telephone Encounter (Signed)
Created in Error

## 2021-09-24 NOTE — Telephone Encounter (Signed)
Pt is aware of results and voiced her understanding. Nothing further needed.  

## 2021-09-28 ENCOUNTER — Ambulatory Visit (INDEPENDENT_AMBULATORY_CARE_PROVIDER_SITE_OTHER): Payer: Medicare Other

## 2021-09-28 ENCOUNTER — Other Ambulatory Visit: Payer: Self-pay

## 2021-09-28 DIAGNOSIS — R011 Cardiac murmur, unspecified: Secondary | ICD-10-CM | POA: Diagnosis not present

## 2021-09-28 DIAGNOSIS — R0602 Shortness of breath: Secondary | ICD-10-CM | POA: Diagnosis not present

## 2021-09-28 LAB — ECHOCARDIOGRAM COMPLETE
AR max vel: 2.78 cm2
AV Area VTI: 2.63 cm2
AV Area mean vel: 3.03 cm2
AV Mean grad: 5 mmHg
AV Peak grad: 8.5 mmHg
Ao pk vel: 1.46 m/s
Area-P 1/2: 3.07 cm2
Calc EF: 66.9 %
MV VTI: 3.77 cm2
S' Lateral: 3.6 cm
Single Plane A2C EF: 67.3 %
Single Plane A4C EF: 65.8 %

## 2021-09-30 ENCOUNTER — Ambulatory Visit (INDEPENDENT_AMBULATORY_CARE_PROVIDER_SITE_OTHER): Payer: Medicare Other | Admitting: Pulmonary Disease

## 2021-09-30 ENCOUNTER — Other Ambulatory Visit: Payer: Self-pay

## 2021-09-30 ENCOUNTER — Encounter: Payer: Self-pay | Admitting: Pulmonary Disease

## 2021-09-30 VITALS — BP 122/80 | HR 96 | Temp 97.7°F | Ht 67.0 in | Wt 178.4 lb

## 2021-09-30 DIAGNOSIS — I272 Pulmonary hypertension, unspecified: Secondary | ICD-10-CM | POA: Diagnosis not present

## 2021-09-30 DIAGNOSIS — R0602 Shortness of breath: Secondary | ICD-10-CM

## 2021-09-30 DIAGNOSIS — J4530 Mild persistent asthma, uncomplicated: Secondary | ICD-10-CM

## 2021-09-30 MED ORDER — BREZTRI AEROSPHERE 160-9-4.8 MCG/ACT IN AERO: 160.0000 ug | INHALATION_SPRAY | Freq: Two times a day (BID) | RESPIRATORY_TRACT | 0 refills | Status: DC

## 2021-09-30 MED ORDER — BREZTRI AEROSPHERE 160-9-4.8 MCG/ACT IN AERO: 2.0000 | INHALATION_SPRAY | Freq: Two times a day (BID) | RESPIRATORY_TRACT | 6 refills | Status: DC

## 2021-09-30 NOTE — Progress Notes (Signed)
Subjective:    Patient ID: Nancy Blackburn, female    DOB: [birth year], 75 y.o.   MRN: [id number] Chief Complaint  Patient presents with   Follow-up    Shortness of breath.    Requesting MD/Service: Miguel Aschoff Date of initial consultation: 06/10/16 by Dr. Merton Border Reason for consultation: Dyspnea   PT PROFILE: 75 y.o. F, lifelong never smoker, referred for evaluation of unexplained exertional dyspnea.    DATA: 04/14/16 Echocardiogram : LVEF 63-89%, grade 2 diastolic dysfunction, LA mildly dilated 04/15/15 Myoview : no evidence of ischemia 07/14/16 PFTs : Normal spirometry, mild restriction, normal DLCO 07/14/16 6 MWT : 336 meters. Limited by arthritic pain in knees > dyspnea 07/03/2020 echocardiogram: EF 60 to 65%, grade 1 DD, there was evidence of increased right ventricular pressure 09/15/2020 long-term monitor: Frequent PVCs/ventricular ectopy, symptomatic 08/26/2021 PFTs: Essentially normal 09/28/2021 echocardiogram: LVEF 60 to 65% LVH grade 1 DD normal RV function, mildly elevated RV systolic pressure, mild MR, TR, aortic sclerosis.   Interim:  Patient has increasing shortness of breath, previously followed by Dr. Merton Border, first visit with me on 24 August 2021.  This is a follow-up visit, no issues in the interim.  Discontinued Breztri on own, notes slight increase in dyspnea since discontinuing.  HPI Nancy Blackburn is a 75 year old lifelong never smoker who follows here for the issue of dyspnea on exertion.  Previously evaluated by Dr. Merton Border at that time it was believed that her dyspnea is secondary to mild persistent asthma.  She does have a sensation of chest heaviness and tachypalpitations when she gets short of breath, this is being evaluated by cardiology.  She had a recent 2D echo because of increased PA caliber on CT performed in April 2022.  Her right ventricular pressures were mildly increased, she does have some diastolic dysfunction otherwise  no significant abnormality.  We discussed that her PFTs were essentially normal.  She interpreted this as she did not need to [place] and stopped it on her own.  We discussed that when asthmatics are well compensated PFTs are usually normal.  She has noted that Breztri did help her some.  These also show that she would benefit from weight loss most of her weight is truncal and this can be causing some mild restrictive physiology.  Nevertheless, her symptoms of dyspnea are out of proportion to the very mild findings on PFTs.  Since her prior visit she has not had any fevers, chills or sweats.  No cough or sputum production.  No hemoptysis.  No sleep disturbance symptoms.  Feels rested upon awakening.   Review of Systems A 10 point review of systems was performed and it is as noted above otherwise negative.  Patient Active Problem List   Diagnosis Date Noted   Asthma 07/02/2021   S/P TKR (total knee replacement) using cement, right 03/09/2021   Bradycardia 07/03/2020   Postural dizziness with presyncope 07/01/2020   Family history of colon cancer in mother 02/22/2018   Cervical spondylosis 05/04/2017   Cervical radiculopathy 05/04/2017   Family history of Cushing disease 01/12/2017   B12 deficiency 12/18/2016   Mild cognitive impairment with memory loss 12/18/2016   Parkinsonian features 12/18/2016   Vitamin D deficiency 12/18/2016   Chalazion, bilateral 06/08/2016   Absolute anemia 07/30/2015   Edema extremities 07/30/2015   Benign essential tremor 07/30/2015   Anxiety, generalized 07/30/2015   Gastro-esophageal reflux disease without esophagitis 07/30/2015   HLD (hyperlipidemia) 07/30/2015   Cannot sleep 07/30/2015  Lumbar radiculopathy 07/30/2015   Depression, major, recurrent, in partial remission ([place]) 07/30/2015   Arthritis, degenerative 07/30/2015   Allergic rhinitis 07/30/2015   Gastroduodenal ulcer 07/30/2015   Calcium blood increased 07/30/2015   Pre-syncope 05/17/2015    Dyspnea 03/31/2015   Bleeding stomach ulcer 10/14/2013   Anemia, iron deficiency 10/14/2013   Weakness generalized 10/14/2013   Ejection fraction    Pericardial effusion    Palpitations    Low back pain    Normal nuclear stress test    Social History   Tobacco Use   Smoking status: Never   Smokeless tobacco: Never  Substance Use Topics   Alcohol use: Yes    Alcohol/week: 0.0 standard drinks    Comment: rarely   Current Meds  Medication Sig   acetaminophen (TYLENOL) 325 MG tablet Take 1-2 tablets (325-650 mg total) by mouth every 6 (six) hours as needed for mild pain (pain score 1-3 or temp > 100.5).   albuterol (VENTOLIN HFA) 108 (90 Base) MCG/ACT inhaler Inhale 1-2 puffs into the lungs every 6 (six) hours as needed for wheezing or shortness of breath.   amLODipine (NORVASC) 5 MG tablet Take 1 tablet (5 mg total) by mouth daily.   busPIRone (BUSPAR) 10 MG tablet Take 10 mg by mouth 2 (two) times daily.    Calcium Carb-Cholecalciferol (CALCIUM 600+D3) 600-800 MG-UNIT TABS Take 1 tablet by mouth daily.   citalopram (CELEXA) 40 MG tablet Take 40 mg by mouth daily.   fexofenadine (ALLEGRA) 180 MG tablet Take 180 mg by mouth daily.   LORazepam (ATIVAN) 0.5 MG tablet Take 1 tablet (0.5 mg total) by mouth 3 (three) times daily as needed for anxiety.   rosuvastatin (CRESTOR) 10 MG tablet TAKE 1 TABLET EVERY DAY (Patient taking differently: Take 10 mg by mouth daily.)   traZODone (DESYREL) 100 MG tablet Take 100 mg by mouth at bedtime.   vitamin B-12 (CYANOCOBALAMIN) 1000 MCG tablet Take 1,000 mcg by mouth daily.   [DISCONTINUED] docusate sodium (COLACE) 100 MG capsule Take 1 capsule (100 mg total) by mouth 2 (two) times daily.   Immunization History  Administered Date(s) Administered   Fluad Quad(high Dose 65+) 10/21/2019, 10/14/2020, 08/25/2021   Influenza Inj Mdck Quad Pf 10/27/2016   Influenza Split 10/05/2011, 10/10/2012   Influenza, High Dose Seasonal PF 09/04/2014, 09/01/2015,  10/05/2017, 10/10/2018   Influenza-Unspecified 10/12/2013, 08/12/2014, 09/04/2014   PFIZER(Purple Top)SARS-COV-2 Vaccination 02/06/2020, 03/03/2020, 11/11/2020   Pneumococcal Conjugate-13 03/26/2015   Pneumococcal Polysaccharide-23 10/11/1999, 04/10/2012   Td 08/23/1999   Tdap 05/20/2019   Tetanus 05/28/2019   Zoster, Live 09/26/2008        Objective:   Physical Exam BP 122/80 (BP Location: Left Arm, Patient Position: Sitting, Cuff Size: Normal)   Pulse 96   Temp 97.7 F (36.5 C) (Oral)   Ht 5\' 7"  (1.702 m)   Wt 178 lb 6.4 oz (80.9 kg)   SpO2 96%   BMI 27.94 kg/m   GENERAL: Well-developed, well-nourished woman, no acute distress, mild tachypnea but no overt respiratory distress.  No conversational dyspnea per se.  Fully ambulatory. HEAD: Normocephalic, atraumatic.  EYES: Pupils equal, round, reactive to light.  No scleral icterus.  Mild periorbital edema MOUTH: Nose/mouth/throat not examined due to masking requirements for COVID 19. NECK: Supple. No thyromegaly. Trachea midline. No JVD.  No adenopathy. PULMONARY: Good air entry bilaterally.  No adventitious sounds. CARDIOVASCULAR: S1 and S2. Regular rate and rhythm.  Grade 2/6 holosystolic murmur, left sternal border. ABDOMEN: Truncal obesity otherwise  benign. MUSCULOSKELETAL: No joint deformity, no clubbing, no edema.  NEUROLOGIC: Mild resting tremor, no overt focal deficit, no gait disturbance. SKIN: Intact,warm,dry. PSYCH: Anxious, normal behavior.      Assessment & Plan:      ICD-10-CM   1. Shortness of breath  R06.02    Out of proportion to the very very mild changes on PFTs Would recommend to continue [facility] for now Continue follow-up with cardiology    2. Pulmonary hypertension (HCC)  I27.20    She has not large-caliber of pulmonary artery on CT Mild elevation of RV pressures on 2D echo 2D echo can underestimate pulmonary hypertension     3. Mild persistent asthma without complication  U20.25     Continue Breztri for now Follow-up in 2 months time     Meds ordered this encounter  Medications   Budeson-Glycopyrrol-Formoterol (BREZTRI AEROSPHERE) 160-9-4.8 MCG/ACT AERO    Sig: Inhale 160 mcg into the lungs in the morning and at bedtime.    Dispense:  5.9 g    Refill:  0    Order Specific Question:   Lot Number?    Answer:   [id number]    Order Specific Question:   Expiration Date?    Answer:   03/11/2024    Order Specific Question:   NDC    Answer:   7628-3151-76 [[id number]]    Order Specific Question:   Quantity    Answer:   2   Budeson-Glycopyrrol-Formoterol (BREZTRI AEROSPHERE) 160-9-4.8 MCG/ACT AERO    Sig: Inhale 2 puffs into the lungs in the morning and at bedtime.    Dispense:  10.7 g    Refill:  6   Patient may benefit from right heart cath as echocardiogram him sometimes underestimate pulmonary hypertension.  She is to follow-up with cardiology next week.  We will resume Breztri,, spacer device also provided.  Follow-up here in 2 months time she is to contact us prior to that time should any new difficulties arise.  Renold Don, MD Advanced Bronchoscopy PCCM [place] [facility]    *This note was dictated using voice recognition software/Dragon.  Despite best efforts to proofread, errors can occur which can change the meaning.  Any change was purely unintentional.

## 2021-09-30 NOTE — Patient Instructions (Signed)
We will restart Breztri 2 puffs twice a day.  We have provided you with a spacer to get the medication and deep into your lungs.  You may need a right heart catheterization to evaluate the pressure going on the artery from your heart to your lungs.  We will see him in follow-up in 2 months time call sooner should any new problems arise.

## 2021-10-08 ENCOUNTER — Ambulatory Visit (INDEPENDENT_AMBULATORY_CARE_PROVIDER_SITE_OTHER): Payer: Medicare Other | Admitting: Cardiology

## 2021-10-08 ENCOUNTER — Other Ambulatory Visit: Payer: Self-pay

## 2021-10-08 ENCOUNTER — Encounter: Payer: Self-pay | Admitting: Cardiology

## 2021-10-08 VITALS — BP 122/66 | HR 74 | Ht 67.0 in | Wt 177.0 lb

## 2021-10-08 DIAGNOSIS — R001 Bradycardia, unspecified: Secondary | ICD-10-CM | POA: Diagnosis not present

## 2021-10-08 DIAGNOSIS — R0602 Shortness of breath: Secondary | ICD-10-CM

## 2021-10-08 DIAGNOSIS — E7800 Pure hypercholesterolemia, unspecified: Secondary | ICD-10-CM | POA: Diagnosis not present

## 2021-10-08 NOTE — Progress Notes (Signed)
Cardiology Office Note:    Date:  10/08/2021   ID:  Jannat, Rosemeyer [birth year], MRN [id number]  PCP:  Jerrol Banana., MD  [facility] Cardiologist:  Kate Sable, MD  [place] Electrophysiologist:  None   Referring MD: Jerrol Banana.,*   Chief Complaint  Patient presents with   Other    12 month follow up -- Patient c.o SOB. Meds reviewed verbally with patient.     History of Present Illness:    Nancy Blackburn is a 75 y.o. female with a hx of hyperlipidemia, Parkinson's disease, mild cognitive impairment, who presents for follow-up.  She was last seen due to bradycardia and syncope.  Beta-blocker was stopped with improvement in symptoms.  Had knee surgery March 2022, states being out of breath after her surgery.  Chest CT did not reveal any evidence for pulmonary embolus.  Echocardiogram was obtained 09/28/2021 showing normal systolic function, impaired relaxation/grade 1 diastolic dysfunction.  She denies chest pain.    Prior notes Echocardiogram 09/2021 EF 60 to 65%, impaired relaxation. Patient was evaluated in the hospital from 7/21-7/23 due to dizziness, presyncope and bradycardia.  Prior to admission, she notes episodes of dizziness and passing out.  Saw her primary care on day of admission and was noted to be bradycardic with heart rate 38.  EKG in the hospital on 7/21 showed marked bradycardia heart rate 37.  Patient was also hypotensive with BP 91/58 upon chart review.  She used to be on propanolol and Aricept which were discontinued.  Echo on 07/03/2020 showed normal systolic function, EF 60 to 65%, impaired relaxation.   Her heart rates improved with subsequent EKG showing sinus bradycardia heart rate 55, and subsequently stayed consistently in the 70s after discontinuation of propanolol and Aricept.   Past Medical History:  Diagnosis Date   Asthma    Bradycardia    Cervical cancer ([place]) 1978   Chronic sinus infection     Ejection fraction    a. EF 55-65%, echo, mild LVH, June, 2013, small pericardial effusion   Fracture, sacrum/coccyx ([place])    History of kidney stones    Hyperlipidemia    a. takes Simvastatin   Hypertension    a. takes Metoprolol, Lisinopril, and amlodipine.   Low back pain    Mild dementia    Nasal polyps    Normal nuclear stress test    a. 07/2005 Nl nuc stress test.   Osteopenia    Osteoporosis    Palpitations    a. June 2013:  Event recorder normal sinus rhythm with rare PVCs - ? symptomatic PVC's.   Parkinson disease ([place])    Pericardial effusion    a. Small, echo, June, 2013   PONV (postoperative nausea and vomiting)     Past Surgical History:  Procedure Laterality Date   BACK SURGERY  08/2010   BLADDER SUSPENSION  2003   bone spur  2009   removed from right collar bone area and partial collar bone removed   CATARACT EXTRACTION W/ INTRAOCULAR LENS  IMPLANT, BILATERAL     EYE SURGERY     LUMBAR FUSION  2011, 2012   SHOULDER ARTHROSCOPY Right 2012   THYROIDECTOMY, PARTIAL  1976   TONSILLECTOMY     as child   TOTAL KNEE ARTHROPLASTY Right 03/09/2021   Procedure: Right TOTAL KNEE ARTHROPLASTY;  Surgeon: Thornton Park, MD;  Location: [facility] ORS;  Service: Orthopedics;  Laterality: Right;   [place]  VAGINAL HYSTERECTOMY  1978   d/t cervical cancer    Current Medications: Current Meds  Medication Sig   acetaminophen (TYLENOL) 325 MG tablet Take 1-2 tablets (325-650 mg total) by mouth every 6 (six) hours as needed for mild pain (pain score 1-3 or temp > 100.5).   albuterol (VENTOLIN HFA) 108 (90 Base) MCG/ACT inhaler Inhale 1-2 puffs into the lungs every 6 (six) hours as needed for wheezing or shortness of breath.   amLODipine (NORVASC) 5 MG tablet Take 1 tablet (5 mg total) by mouth daily.   Budeson-Glycopyrrol-Formoterol (BREZTRI AEROSPHERE) 160-9-4.8 MCG/ACT AERO Inhale 160 mcg into the lungs in the morning and at bedtime.   busPIRone (BUSPAR) 10 MG  tablet Take 10 mg by mouth 2 (two) times daily.    Calcium Carb-Cholecalciferol (CALCIUM 600+D3) 600-800 MG-UNIT TABS Take 1 tablet by mouth daily.   citalopram (CELEXA) 40 MG tablet Take 40 mg by mouth daily.   fexofenadine (ALLEGRA) 180 MG tablet Take 180 mg by mouth daily.   LORazepam (ATIVAN) 0.5 MG tablet Take 1 tablet (0.5 mg total) by mouth 3 (three) times daily as needed for anxiety.   rosuvastatin (CRESTOR) 10 MG tablet TAKE 1 TABLET EVERY DAY   traZODone (DESYREL) 100 MG tablet Take 100 mg by mouth at bedtime.   vitamin B-12 (CYANOCOBALAMIN) 1000 MCG tablet Take 1,000 mcg by mouth daily.   Current Facility-Administered Medications for the 10/08/21 encounter (Office Visit) with Kate Sable, MD  Medication   methylPREDNISolone acetate (DEPO-MEDROL) injection 40 mg     Allergies:   Anti-inflammatory enzyme [nutritional supplements], Cymbalta [duloxetine hcl], Clarithromycin, Donepezil hcl, Nsaids, Ranitidine, and Venlafaxine   Social History   Socioeconomic History   Marital status: Divorced    Spouse name: Not on file   Number of children: 2   Years of education: Not on file   Highest education level: 12th grade  Occupational History   Occupation: retired  Tobacco Use   Smoking status: Never   Smokeless tobacco: Never  Vaping Use   Vaping Use: Never used  Substance and Sexual Activity   Alcohol use: Yes    Alcohol/week: 0.0 standard drinks    Comment: rarely   Drug use: No   Sexual activity: Never  Other Topics Concern   Not on file  Social History Narrative   Not on file   Social Determinants of Health   Financial Resource Strain: Not on file  Food Insecurity: Not on file  Transportation Needs: Not on file  Physical Activity: Not on file  Stress: Not on file  Social Connections: Not on file     Family History: The patient's family history includes Angina in her mother; Atrial fibrillation in her brother; Breast cancer in her cousin; COPD in her  father; Cancer in her daughter; Colon cancer in her mother; Cushing syndrome in her daughter; Heart disease in her father and mother; Heart murmur in her mother. There is no history of Hypotension, Malignant hyperthermia, or Pseudochol deficiency.  ROS:   Please see the history of present illness.     All other systems reviewed and are negative.  EKGs/Labs/Other Studies Reviewed:    The following studies were reviewed today:   EKG:  EKG is  ordered today.  The ekg ordered today demonstrates sinus rhythm, HR 74,   Recent Labs: 04/04/2021: B Natriuretic Peptide 107.5 04/14/2021: TSH 1.620 07/29/2021: ALT 12; BUN 13; Creatinine, Ser 0.84; Hemoglobin 13.9; NT-Pro BNP 293; Platelets 302; Potassium 4.4; Sodium 143  Recent  Lipid Panel    Component Value Date/Time   CHOL 200 (H) 04/12/2019 1116   TRIG 154 (H) 04/12/2019 1116   HDL 59 04/12/2019 1116   CHOLHDL 3.4 04/12/2019 1116   LDLCALC 110 (H) 04/12/2019 1116    Physical Exam:    VS:  BP 122/66 (BP Location: Left Arm, Patient Position: Sitting, Cuff Size: Normal)   Pulse 74   Ht 5\' 7"  (1.702 m)   Wt 177 lb (80.3 kg)   SpO2 96%   BMI 27.72 kg/m     Wt Readings from Last 3 Encounters:  10/08/21 177 lb (80.3 kg)  09/30/21 178 lb 6.4 oz (80.9 kg)  08/24/21 174 lb 12.8 oz (79.3 kg)     GEN:  Well nourished, well developed in no acute distress, occasional tremors HEENT: Normal NECK: No JVD; No carotid bruits LYMPHATICS: No lymphadenopathy CARDIAC: RRR, no murmurs, rubs, gallops RESPIRATORY:  Clear to auscultation without rales, wheezing or rhonchi  ABDOMEN: Soft, non-tender, non-distended MUSCULOSKELETAL:  No edema; No deformity  SKIN: Warm and dry NEUROLOGIC:  Alert and oriented x 3, resting tremors noted PSYCHIATRIC:  Normal affect   ASSESSMENT:    1. Shortness of breath   2. Bradycardia   3. Pure hypercholesterolemia     PLAN:    In order of problems listed above:  Shortness of breath, likely due to  deconditioning.  Not consistent with angina.  Graduated exercising advised.  Echo unchanged from prior with preserved ejection fraction EF 30%, grade 1 diastolic dysfunction.  PASP normal at 45mmHg History of bradycardia, symptoms resolved since stopping beta-blockers and Aricept. History of hyperlipidemia, continue Crestor.    Follow-up in 6 months   Medication Adjustments/Labs and Tests Ordered: Current medicines are reviewed at length with the patient today.  Concerns regarding medicines are outlined above.  Orders Placed This Encounter  Procedures   EKG 12-Lead    No orders of the defined types were placed in this encounter.   Patient Instructions  Medication Instructions:  Your physician recommends that you continue on your current medications as directed. Please refer to the Current Medication list given to you today.  *If you need a refill on your cardiac medications before your next appointment, please call your pharmacy*   Lab Work: None ordered If you have labs (blood work) drawn today and your tests are completely normal, you will receive your results only by: [place] (if you have MyChart) OR A paper copy in the mail If you have any lab test that is abnormal or we need to change your treatment, we will call you to review the results.   Testing/Procedures: None ordered   Follow-Up: At [facility], you and your health needs are our priority.  As part of our continuing mission to provide you with exceptional heart care, we have created designated Provider Care Teams.  These Care Teams include your primary Cardiologist (physician) and Advanced Practice Providers (APPs -  Physician Assistants and Nurse Practitioners) who all work together to provide you with the care you need, when you need it.  We recommend signing up for the patient portal called "MyChart".  Sign up information is provided on this After Visit Summary.  MyChart is used to connect with patients  for Virtual Visits (Telemedicine).  Patients are able to view lab/test results, encounter notes, upcoming appointments, etc.  Non-urgent messages can be sent to your provider as well.   To learn more about what you can do with MyChart, go to  [email or web address].    Your next appointment:   6 month(s)  The format for your next appointment:   In Person  Provider:   You may see Kate Sable, MD or one of the following Advanced Practice Providers on your designated Care Team:   Murray Hodgkins, NP Christell Faith, PA-C Marrianne Mood, PA-C Cadence Genoa, [place]   Other Instructions     Signed, Kate Sable, MD  10/08/2021 12:42 PM    [place]

## 2021-10-08 NOTE — Patient Instructions (Signed)
Medication Instructions:  Your physician recommends that you continue on your current medications as directed. Please refer to the Current Medication list given to you today.  *If you need a refill on your cardiac medications before your next appointment, please call your pharmacy*   Lab Work: None ordered If you have labs (blood work) drawn today and your tests are completely normal, you will receive your results only by: MyChart Message (if you have MyChart) OR A paper copy in the mail If you have any lab test that is abnormal or we need to change your treatment, we will call you to review the results.   Testing/Procedures: None ordered   Follow-Up: At [facility], you and your health needs are our priority.  As part of our continuing mission to provide you with exceptional heart care, we have created designated Provider Care Teams.  These Care Teams include your primary Cardiologist (physician) and Advanced Practice Providers (APPs -  Physician Assistants and Nurse Practitioners) who all work together to provide you with the care you need, when you need it.  We recommend signing up for the patient portal called "MyChart".  Sign up information is provided on this After Visit Summary.  MyChart is used to connect with patients for Virtual Visits (Telemedicine).  Patients are able to view lab/test results, encounter notes, upcoming appointments, etc.  Non-urgent messages can be sent to your provider as well.   To learn more about what you can do with MyChart, go to [email or web address].    Your next appointment:   6 month(s)  The format for your next appointment:   In Person  Provider:   You may see Brian Agbor-Etang, MD or one of the following Advanced Practice Providers on your designated Care Team:   Christopher Berge, NP Ryan Dunn, PA-C Jacquelyn Visser, PA-C Cadence Furth, PA-C   Other Instructions   

## 2021-10-18 ENCOUNTER — Emergency Department: Payer: Medicare Other

## 2021-10-18 ENCOUNTER — Inpatient Hospital Stay: Payer: Medicare Other

## 2021-10-18 ENCOUNTER — Inpatient Hospital Stay
Admission: EM | Admit: 2021-10-18 | Discharge: 2021-10-26 | DRG: 871 | Disposition: A | Discharge status: Skilled Nursing Facility | Payer: Medicare Other | Attending: Internal Medicine | Admitting: Internal Medicine

## 2021-10-18 ENCOUNTER — Other Ambulatory Visit: Payer: Self-pay

## 2021-10-18 DIAGNOSIS — R5381 Other malaise: Secondary | ICD-10-CM | POA: Diagnosis not present

## 2021-10-18 DIAGNOSIS — Z8249 Family history of ischemic heart disease and other diseases of the circulatory system: Secondary | ICD-10-CM

## 2021-10-18 DIAGNOSIS — G3184 Mild cognitive impairment, so stated: Secondary | ICD-10-CM | POA: Diagnosis present

## 2021-10-18 DIAGNOSIS — Z825 Family history of asthma and other chronic lower respiratory diseases: Secondary | ICD-10-CM

## 2021-10-18 DIAGNOSIS — A419 Sepsis, unspecified organism: Secondary | ICD-10-CM | POA: Diagnosis not present

## 2021-10-18 DIAGNOSIS — F02A4 Dementia in other diseases classified elsewhere, mild, with anxiety: Secondary | ICD-10-CM | POA: Diagnosis present

## 2021-10-18 DIAGNOSIS — G20 Parkinson's disease: Secondary | ICD-10-CM | POA: Diagnosis present

## 2021-10-18 DIAGNOSIS — N949 Unspecified condition associated with female genital organs and menstrual cycle: Secondary | ICD-10-CM

## 2021-10-18 DIAGNOSIS — M25561 Pain in right knee: Secondary | ICD-10-CM

## 2021-10-18 DIAGNOSIS — Z780 Asymptomatic menopausal state: Secondary | ICD-10-CM | POA: Diagnosis not present

## 2021-10-18 DIAGNOSIS — M6282 Rhabdomyolysis: Secondary | ICD-10-CM | POA: Diagnosis present

## 2021-10-18 DIAGNOSIS — Z981 Arthrodesis status: Secondary | ICD-10-CM

## 2021-10-18 DIAGNOSIS — Z803 Family history of malignant neoplasm of breast: Secondary | ICD-10-CM

## 2021-10-18 DIAGNOSIS — N12 Tubulo-interstitial nephritis, not specified as acute or chronic: Secondary | ICD-10-CM | POA: Diagnosis not present

## 2021-10-18 DIAGNOSIS — R4182 Altered mental status, unspecified: Secondary | ICD-10-CM

## 2021-10-18 DIAGNOSIS — R6520 Severe sepsis without septic shock: Secondary | ICD-10-CM | POA: Diagnosis not present

## 2021-10-18 DIAGNOSIS — R0602 Shortness of breath: Secondary | ICD-10-CM | POA: Diagnosis not present

## 2021-10-18 DIAGNOSIS — R531 Weakness: Secondary | ICD-10-CM

## 2021-10-18 DIAGNOSIS — Z96651 Presence of right artificial knee joint: Secondary | ICD-10-CM

## 2021-10-18 DIAGNOSIS — M6281 Muscle weakness (generalized): Secondary | ICD-10-CM | POA: Diagnosis not present

## 2021-10-18 DIAGNOSIS — Z90710 Acquired absence of both cervix and uterus: Secondary | ICD-10-CM | POA: Diagnosis not present

## 2021-10-18 DIAGNOSIS — I959 Hypotension, unspecified: Secondary | ICD-10-CM | POA: Diagnosis present

## 2021-10-18 DIAGNOSIS — M810 Age-related osteoporosis without current pathological fracture: Secondary | ICD-10-CM | POA: Diagnosis present

## 2021-10-18 DIAGNOSIS — A4159 Other Gram-negative sepsis: Secondary | ICD-10-CM | POA: Diagnosis not present

## 2021-10-18 DIAGNOSIS — R000 Tachycardia, unspecified: Secondary | ICD-10-CM | POA: Diagnosis not present

## 2021-10-18 DIAGNOSIS — G250 Essential tremor: Secondary | ICD-10-CM | POA: Diagnosis present

## 2021-10-18 DIAGNOSIS — I10 Essential (primary) hypertension: Secondary | ICD-10-CM | POA: Diagnosis present

## 2021-10-18 DIAGNOSIS — W19XXXA Unspecified fall, initial encounter: Secondary | ICD-10-CM | POA: Diagnosis present

## 2021-10-18 DIAGNOSIS — Z87442 Personal history of urinary calculi: Secondary | ICD-10-CM

## 2021-10-18 DIAGNOSIS — N136 Pyonephrosis: Secondary | ICD-10-CM | POA: Diagnosis present

## 2021-10-18 DIAGNOSIS — D72829 Elevated white blood cell count, unspecified: Secondary | ICD-10-CM | POA: Diagnosis present

## 2021-10-18 DIAGNOSIS — F3341 Major depressive disorder, recurrent, in partial remission: Secondary | ICD-10-CM | POA: Diagnosis present

## 2021-10-18 DIAGNOSIS — R7881 Bacteremia: Secondary | ICD-10-CM | POA: Diagnosis not present

## 2021-10-18 DIAGNOSIS — Z743 Need for continuous supervision: Secondary | ICD-10-CM | POA: Diagnosis not present

## 2021-10-18 DIAGNOSIS — E871 Hypo-osmolality and hyponatremia: Secondary | ICD-10-CM | POA: Diagnosis present

## 2021-10-18 DIAGNOSIS — N200 Calculus of kidney: Secondary | ICD-10-CM | POA: Diagnosis present

## 2021-10-18 DIAGNOSIS — Z043 Encounter for examination and observation following other accident: Secondary | ICD-10-CM | POA: Diagnosis not present

## 2021-10-18 DIAGNOSIS — Z20822 Contact with and (suspected) exposure to covid-19: Secondary | ICD-10-CM | POA: Diagnosis present

## 2021-10-18 DIAGNOSIS — E538 Deficiency of other specified B group vitamins: Secondary | ICD-10-CM | POA: Diagnosis present

## 2021-10-18 DIAGNOSIS — N170 Acute kidney failure with tubular necrosis: Secondary | ICD-10-CM | POA: Diagnosis present

## 2021-10-18 DIAGNOSIS — E876 Hypokalemia: Secondary | ICD-10-CM | POA: Diagnosis not present

## 2021-10-18 DIAGNOSIS — N1330 Unspecified hydronephrosis: Secondary | ICD-10-CM | POA: Diagnosis not present

## 2021-10-18 DIAGNOSIS — Z888 Allergy status to other drugs, medicaments and biological substances status: Secondary | ICD-10-CM

## 2021-10-18 DIAGNOSIS — I272 Pulmonary hypertension, unspecified: Secondary | ICD-10-CM | POA: Diagnosis present

## 2021-10-18 DIAGNOSIS — R7989 Other specified abnormal findings of blood chemistry: Secondary | ICD-10-CM

## 2021-10-18 DIAGNOSIS — Z8541 Personal history of malignant neoplasm of cervix uteri: Secondary | ICD-10-CM

## 2021-10-18 DIAGNOSIS — N83202 Unspecified ovarian cyst, left side: Secondary | ICD-10-CM

## 2021-10-18 DIAGNOSIS — Z881 Allergy status to other antibiotic agents status: Secondary | ICD-10-CM

## 2021-10-18 DIAGNOSIS — Z800 Family history of malignant neoplasm of digestive organs: Secondary | ICD-10-CM

## 2021-10-18 DIAGNOSIS — N179 Acute kidney failure, unspecified: Secondary | ICD-10-CM

## 2021-10-18 DIAGNOSIS — N83209 Unspecified ovarian cyst, unspecified side: Secondary | ICD-10-CM

## 2021-10-18 DIAGNOSIS — R32 Unspecified urinary incontinence: Secondary | ICD-10-CM | POA: Diagnosis not present

## 2021-10-18 DIAGNOSIS — M4856XA Collapsed vertebra, not elsewhere classified, lumbar region, initial encounter for fracture: Secondary | ICD-10-CM | POA: Diagnosis not present

## 2021-10-18 DIAGNOSIS — S8001XA Contusion of right knee, initial encounter: Secondary | ICD-10-CM | POA: Diagnosis present

## 2021-10-18 DIAGNOSIS — Z961 Presence of intraocular lens: Secondary | ICD-10-CM | POA: Diagnosis present

## 2021-10-18 DIAGNOSIS — E785 Hyperlipidemia, unspecified: Secondary | ICD-10-CM | POA: Diagnosis not present

## 2021-10-18 DIAGNOSIS — N390 Urinary tract infection, site not specified: Secondary | ICD-10-CM | POA: Diagnosis not present

## 2021-10-18 DIAGNOSIS — G9341 Metabolic encephalopathy: Secondary | ICD-10-CM | POA: Diagnosis present

## 2021-10-18 DIAGNOSIS — D509 Iron deficiency anemia, unspecified: Secondary | ICD-10-CM | POA: Diagnosis present

## 2021-10-18 DIAGNOSIS — R410 Disorientation, unspecified: Secondary | ICD-10-CM | POA: Diagnosis not present

## 2021-10-18 DIAGNOSIS — R55 Syncope and collapse: Secondary | ICD-10-CM | POA: Diagnosis not present

## 2021-10-18 DIAGNOSIS — Z9841 Cataract extraction status, right eye: Secondary | ICD-10-CM

## 2021-10-18 DIAGNOSIS — R2681 Unsteadiness on feet: Secondary | ICD-10-CM | POA: Diagnosis not present

## 2021-10-18 DIAGNOSIS — Z79899 Other long term (current) drug therapy: Secondary | ICD-10-CM

## 2021-10-18 DIAGNOSIS — R2689 Other abnormalities of gait and mobility: Secondary | ICD-10-CM | POA: Diagnosis not present

## 2021-10-18 DIAGNOSIS — B964 Proteus (mirabilis) (morganii) as the cause of diseases classified elsewhere: Secondary | ICD-10-CM | POA: Diagnosis present

## 2021-10-18 DIAGNOSIS — R42 Dizziness and giddiness: Secondary | ICD-10-CM

## 2021-10-18 DIAGNOSIS — F411 Generalized anxiety disorder: Secondary | ICD-10-CM | POA: Diagnosis present

## 2021-10-18 DIAGNOSIS — R61 Generalized hyperhidrosis: Secondary | ICD-10-CM | POA: Diagnosis not present

## 2021-10-18 DIAGNOSIS — J4530 Mild persistent asthma, uncomplicated: Secondary | ICD-10-CM | POA: Diagnosis present

## 2021-10-18 DIAGNOSIS — Z9842 Cataract extraction status, left eye: Secondary | ICD-10-CM

## 2021-10-18 HISTORY — DX: Unspecified condition associated with female genital organs and menstrual cycle: N94.9

## 2021-10-18 LAB — CK
Total CK: 14929 U/L — ABNORMAL HIGH (ref 38–234)
Total CK: 15867 U/L — ABNORMAL HIGH (ref 38–234)

## 2021-10-18 LAB — PHOSPHORUS: Phosphorus: 3.9 mg/dL (ref 2.5–4.6)

## 2021-10-18 LAB — COMPREHENSIVE METABOLIC PANEL
ALT: 162 U/L — ABNORMAL HIGH (ref 0–44)
AST: 636 U/L — ABNORMAL HIGH (ref 15–41)
Albumin: 3.1 g/dL — ABNORMAL LOW (ref 3.5–5.0)
Alkaline Phosphatase: 80 U/L (ref 38–126)
Anion gap: 12 (ref 5–15)
BUN: 48 mg/dL — ABNORMAL HIGH (ref 8–23)
CO2: 21 mmol/L — ABNORMAL LOW (ref 22–32)
Calcium: 8.3 mg/dL — ABNORMAL LOW (ref 8.9–10.3)
Chloride: 101 mmol/L (ref 98–111)
Creatinine, Ser: 2.18 mg/dL — ABNORMAL HIGH (ref 0.44–1.00)
GFR, Estimated: 23 mL/min — ABNORMAL LOW (ref >60)
Glucose, Bld: 171 mg/dL — ABNORMAL HIGH (ref 70–99)
Potassium: 3.4 mmol/L — ABNORMAL LOW (ref 3.5–5.1)
Sodium: 134 mmol/L — ABNORMAL LOW (ref 135–145)
Total Bilirubin: 0.9 mg/dL (ref 0.3–1.2)
Total Protein: 6.1 g/dL — ABNORMAL LOW (ref 6.5–8.1)

## 2021-10-18 LAB — LACTIC ACID, PLASMA
Lactic Acid, Venous: 2.1 mmol/L (ref 0.5–1.9)
Lactic Acid, Venous: 0.9 mmol/L (ref 0.5–1.9)

## 2021-10-18 LAB — CBC
HCT: 42.1 % (ref 36.0–46.0)
Hemoglobin: 14.3 g/dL (ref 12.0–15.0)
MCH: 30 pg (ref 26.0–34.0)
MCHC: 34 g/dL (ref 30.0–36.0)
MCV: 88.3 fL (ref 80.0–100.0)
Platelets: 131 10*3/uL — ABNORMAL LOW (ref 150–400)
RBC: 4.77 MIL/uL (ref 3.87–5.11)
RDW: 12.9 % (ref 11.5–15.5)
WBC: 12.9 10*3/uL — ABNORMAL HIGH (ref 4.0–10.5)
nRBC: 0 % (ref 0.0–0.2)

## 2021-10-18 LAB — BRAIN NATRIURETIC PEPTIDE: B Natriuretic Peptide: 229.4 pg/mL — ABNORMAL HIGH (ref 0.0–100.0)

## 2021-10-18 LAB — MAGNESIUM: Magnesium: 1.9 mg/dL (ref 1.7–2.4)

## 2021-10-18 LAB — PROCALCITONIN: Procalcitonin: 47.17 ng/mL

## 2021-10-18 LAB — VITAMIN D 25 HYDROXY (VIT D DEFICIENCY, FRACTURES): Vit D, 25-Hydroxy: 18.5 ng/mL — ABNORMAL LOW (ref 30–100)

## 2021-10-18 LAB — RESP PANEL BY RT-PCR (FLU A&B, COVID) ARPGX2
Influenza A by PCR: NEGATIVE
Influenza B by PCR: NEGATIVE
SARS Coronavirus 2 by RT PCR: NEGATIVE

## 2021-10-18 LAB — AMMONIA: Ammonia: 16 umol/L (ref 9–35)

## 2021-10-18 LAB — TROPONIN I (HIGH SENSITIVITY)
Troponin I (High Sensitivity): 292 ng/L (ref <18)
Troponin I (High Sensitivity): 227 ng/L (ref <18)

## 2021-10-18 LAB — VITAMIN B12: Vitamin B-12: 392 pg/mL (ref 180–914)

## 2021-10-18 LAB — MRSA NEXT GEN BY PCR, NASAL: MRSA by PCR Next Gen: NOT DETECTED

## 2021-10-18 MED ORDER — VANCOMYCIN HCL 1500 MG/300ML IV SOLN
1500.0000 mg | Freq: Once | INTRAVENOUS | Status: AC
  Administered 2021-10-18: 1500 mg via INTRAVENOUS
  Filled 2021-10-18: qty 300

## 2021-10-18 MED ORDER — SODIUM CHLORIDE 0.9 % IV SOLN
500.0000 mg | Freq: Once | INTRAVENOUS | Status: AC
  Administered 2021-10-18: 500 mg via INTRAVENOUS
  Filled 2021-10-18: qty 500

## 2021-10-18 MED ORDER — SODIUM CHLORIDE 0.9 % IV SOLN
2.0000 g | Freq: Once | INTRAVENOUS | Status: AC
  Administered 2021-10-18: 2 g via INTRAVENOUS
  Filled 2021-10-18: qty 2

## 2021-10-18 MED ORDER — SODIUM CHLORIDE 0.9 % IV BOLUS (SEPSIS)
1000.0000 mL | Freq: Once | INTRAVENOUS | Status: AC
  Administered 2021-10-18: 1000 mL via INTRAVENOUS

## 2021-10-18 MED ORDER — LACTATED RINGERS IV BOLUS
1000.0000 mL | Freq: Once | INTRAVENOUS | Status: AC
  Administered 2021-10-18: 1000 mL via INTRAVENOUS

## 2021-10-18 MED ORDER — ONDANSETRON HCL 4 MG PO TABS
4.0000 mg | ORAL_TABLET | Freq: Four times a day (QID) | ORAL | Status: DC | PRN
  Administered 2021-10-25 (×2): 4 mg via ORAL
  Filled 2021-10-18 (×2): qty 1

## 2021-10-18 MED ORDER — ONDANSETRON HCL 4 MG/2ML IJ SOLN
4.0000 mg | Freq: Four times a day (QID) | INTRAMUSCULAR | Status: DC | PRN
  Administered 2021-10-24 – 2021-10-25 (×2): 4 mg via INTRAVENOUS
  Filled 2021-10-18 (×2): qty 2

## 2021-10-18 MED ORDER — ROSUVASTATIN CALCIUM 10 MG PO TABS
10.0000 mg | ORAL_TABLET | Freq: Every day | ORAL | Status: DC
  Filled 2021-10-18: qty 1

## 2021-10-18 MED ORDER — HEPARIN SODIUM (PORCINE) 5000 UNIT/ML IJ SOLN
5000.0000 [IU] | Freq: Three times a day (TID) | INTRAMUSCULAR | Status: DC
  Administered 2021-10-19 – 2021-10-26 (×22): 5000 [IU] via SUBCUTANEOUS
  Filled 2021-10-18 (×21): qty 1

## 2021-10-18 MED ORDER — SODIUM CHLORIDE 0.9 % IV SOLN: 2.0000 g | INTRAVENOUS | Status: DC

## 2021-10-18 MED ORDER — LACTATED RINGERS IV SOLN: INTRAVENOUS | Status: DC

## 2021-10-18 MED ORDER — ALBUTEROL SULFATE (2.5 MG/3ML) 0.083% IN NEBU: 3.0000 mL | INHALATION_SOLUTION | Freq: Four times a day (QID) | RESPIRATORY_TRACT | Status: DC | PRN

## 2021-10-18 MED ORDER — ROSUVASTATIN CALCIUM 10 MG PO TABS: 10.0000 mg | ORAL_TABLET | Freq: Every day | ORAL | Status: DC

## 2021-10-18 MED ORDER — METRONIDAZOLE 500 MG/100ML IV SOLN
500.0000 mg | Freq: Once | INTRAVENOUS | Status: AC
  Administered 2021-10-18: 500 mg via INTRAVENOUS
  Filled 2021-10-18: qty 100

## 2021-10-18 MED ORDER — ACETAMINOPHEN 650 MG RE SUPP: 650.0000 mg | Freq: Four times a day (QID) | RECTAL | Status: DC | PRN

## 2021-10-18 MED ORDER — VANCOMYCIN HCL IN DEXTROSE 1-5 GM/200ML-% IV SOLN: 1000.0000 mg | Freq: Once | INTRAVENOUS | Status: DC

## 2021-10-18 MED ORDER — VANCOMYCIN VARIABLE DOSE PER UNSTABLE RENAL FUNCTION (PHARMACIST DOSING): Status: DC

## 2021-10-18 MED ORDER — METRONIDAZOLE 500 MG/100ML IV SOLN
500.0000 mg | Freq: Two times a day (BID) | INTRAVENOUS | Status: DC
  Administered 2021-10-19: 500 mg via INTRAVENOUS
  Filled 2021-10-18: qty 100

## 2021-10-18 MED ORDER — ACETAMINOPHEN 325 MG PO TABS
650.0000 mg | ORAL_TABLET | Freq: Four times a day (QID) | ORAL | Status: DC | PRN
  Administered 2021-10-19 – 2021-10-26 (×19): 650 mg via ORAL
  Filled 2021-10-18 (×20): qty 2

## 2021-10-18 MED ORDER — MIDODRINE HCL 5 MG PO TABS
10.0000 mg | ORAL_TABLET | Freq: Once | ORAL | Status: AC
  Administered 2021-10-18: 10 mg via ORAL
  Filled 2021-10-18: qty 2

## 2021-10-18 NOTE — H&P (Signed)
History and Physical   Nancy Blackburn OFH:[id number] DOB: [birth year] DOA: 10/18/2021  PCP: Jerrol Banana., MD  Outpatient Specialists: Dr. Patsey Berthold, pulmonology Patient coming from: Home via [place]  I have personally briefly reviewed patient's old medical records in [place].  Chief Concern: Altered mental status  HPI: Nancy Blackburn is a 75 y.o. female with medical history significant for depression, anxiety, hypertension, hyperlipidemia, mild persistent asthma, pulmonary hypertension, who presents to the emergency department for chief concerns of altered mental status per family.  Per grandson, patient was not feeling well since Noon. Patietn did not go to church and was confused about the time. Friend helped to get her in the chair and started shaking.   Grandson, stated that she did not have fever, diarrhea, vomiting.   Grandson had generalized malaise, fever, about two weeks ago. He tried to isolated for three himself from 10/27 to 10/30.   At bedside patient is able to tell me her name, age, identify her grandson at bedside and knows she is in the hospital. She has increased somnolence and is arousable with loud verbal stimuli and chest rub. It she has history of right knee replacement. Patient's left knee is negative for erythema and edema at this time. Patient's mucosa appears dry, and she does have suprapubic tenderness with palpation.  She denies being in pain at this time.  She does endorse that her right knee does hurt.  Grandson at bedside does state that she looks better than she did yesterday prior to ED presentation.  He reports that her mental status is still confused which is abnormal for her.  Social history: She lives at home with grandson, Otho Ket. She denies history of etoh, recreational. She formerly worked in a [employer] as a Freight forwarder.   Vaccination history: She is vaccinated for covid 19, two doses   ROS: Unable to complete due  to patient altered mental status  ED Course: Discussed with emergency medicine provider, patient requiring hospitalization for chief concerns of altered mental status concerning for sepsis.  Vitals in the emergency department was remarkable for temperature of 98.5, respiration rate of 22, heart rate of 106, blood pressure initially was 82/61, and responded to IV fluid bolus with improvement to 118/58, SPO2 of 87% on room air.  Labs in the emergency department was remarkable for WBC 12.9, hemoglobin 14.3, platelets of 131, sodium 134, potassium 3.4, chloride 101, bicarb 21, BUN 48, serum creatinine of 2.18, platelets 171, GFR of 23.  Multiple inflammatory markers abnormality.  CK was elevated at 14,929, BNP elevated at 229.4, troponin high-sensitivity was 292, procalcitonin was elevated at 47.17, lactic acid elevated at 2.1.  In the emergency department patient received cefepime, Flagyl, vancomycin IV.  2 L of total of lactated ringer bolus were given.  Assessment/Plan  Principal Problem:   Severe sepsis with acute organ dysfunction (HCC) Active Problems:   Anemia, iron deficiency   Weakness generalized   Benign essential tremor   Anxiety, generalized   HLD (hyperlipidemia)   Depression, major, recurrent, in partial remission (HCC)   B12 deficiency   Mild cognitive impairment with memory loss   Postural dizziness with presyncope   S/P TKR (total knee replacement) using cement, right   Rhabdomyolysis   AKI (acute kidney injury) [facility])   # Patient met criteria for severe sepsis with organ dysfunction - Etiology work-up in progress, high clinical suspicion for UTI versus pneumonia, organ involvement is kidneys and possible hepatic - However given  multi inflammatory markers elevated, bacteremia cannot be excluded at this time - Elevated respiration rate, heart rate, WBC elevated, inflammatory markers elevated including lactic acid, procalcitonin, high sensitive troponin - Aggressive fluid  hydration per EDP - Added sodium chloride 1 L bolus to complete sepsis bolus - LR at 150 mL/h IVF ordered to be started after sodium chloride 1 L bolus ordered - Continue with cefepime, vancomycin, Flagyl - Added azithromycin for possible atypical coverage - UA ordered and pending - Blood cultures x2 ordered and pending - Check stat magnesium and phosphorus, B12, B1, vitamin D - CBC, CMP, LFT, procalcitonin in the a.m. - Admit to progressive cardiac, inpatient, telemetry - If patient's mentation does not improve and her septic picture appears to be improving, would recommend a.m. team to consider MRI of the brain for further evaluation  # Hypotension-suspect secondary to septic shock, responsive to IV fluid - 1 dose of midodrine 10 mg p.o. once ordered  # Acute kidney injury-this is presumed secondary to severe sepsis with endorgan dysfunction, BMP in the AM  # Elevated LFT-suspect acute hepatitis versus shock liver versus transaminitis - Treat with aggressive fluid hydration - BMP, liver function test in the a.m.  # Rhabdomyolysis-etiology unclear at this time - Does not appear to have any bodily injury and or trauma - Etiology work-up in progress, I presume this is secondary to severe sepsis -We will check CK in the a.m.  # NSTEMI-downtrending high sensitive troponin - Low clinical suspicion for ACS at this time, clinical picture is likely secondary to severe sepsis  # History of hypertension-patient's home amlodipine has been held at this time due to severe sepsis with hypotension on initial presentation  # Hyperlipidemia-rosuvastatin 10 mg nightly resumed for 10/19/2021 due to elevated LFTs  Chart reviewed.   DVT prophylaxis: Heparin 5000 units subcutaneous every 8 hours Code Status: Full code, grandson at bedside does not know, states he will contact patient's son as he is the healthcare power of attorney.  Patient has 2 children, daughter and a son.  Patient's grandson states  that his mom (patient's daughter) is currently being hospitalized in the hospital at [facility]. Diet: N.p.o. due to concerns for aspiration pneumonia Family Communication: Updated grandson at bedside, Otho Ket Disposition Plan: Pending clinical course, guarded prognosis Consults called: None at this time Admission status: Progressive cardiac, inpatient, telemetry  Past Medical History:  Diagnosis Date   Asthma    Bradycardia    Cervical cancer ([place]) 1978   Chronic sinus infection    Ejection fraction    a. EF 55-65%, echo, mild LVH, June, 2013, small pericardial effusion   Fracture, sacrum/coccyx ([place])    History of kidney stones    Hyperlipidemia    a. takes Simvastatin   Hypertension    a. takes Metoprolol, Lisinopril, and amlodipine.   Low back pain    Mild dementia    Nasal polyps    Normal nuclear stress test    a. 07/2005 Nl nuc stress test.   Osteopenia    Osteoporosis    Palpitations    a. June 2013:  Event recorder normal sinus rhythm with rare PVCs - ? symptomatic PVC's.   Parkinson disease ([place])    Pericardial effusion    a. Small, echo, June, 2013   PONV (postoperative nausea and vomiting)    Past Surgical History:  Procedure Laterality Date   BACK SURGERY  08/2010   BLADDER SUSPENSION  2003   bone spur  2009   removed  from right collar bone area and partial collar bone removed   CATARACT EXTRACTION W/ INTRAOCULAR LENS  IMPLANT, BILATERAL     EYE SURGERY     LUMBAR FUSION  2011, 2012   SHOULDER ARTHROSCOPY Right 2012   THYROIDECTOMY, PARTIAL  1976   TONSILLECTOMY     as child   TOTAL KNEE ARTHROPLASTY Right 03/09/2021   Procedure: Right TOTAL KNEE ARTHROPLASTY;  Surgeon: Thornton Park, MD;  Location: [facility] ORS;  Service: Orthopedics;  Laterality: Right;   TUBAL LIGATION  1977   VAGINAL HYSTERECTOMY  1978   d/t cervical cancer   Social History:  reports that she has never smoked. She has never used smokeless tobacco. She reports current alcohol use.  She reports that she does not use drugs.  Allergies  Allergen Reactions   Anti-Inflammatory Enzyme [Nutritional Supplements] Other (See Comments)    REACTION: Due to bleeding ulcer   Cymbalta [Duloxetine Hcl] Other (See Comments)    REACTION: Urinary retention   Clarithromycin Other (See Comments)    Upsets stomach   Donepezil Hcl Other (See Comments)    Significant bradycardia causing syncope   Nsaids     GI BLEED   Ranitidine     severe diarrhea   Venlafaxine     severe depression   Family History  Problem Relation Age of Onset   Colon cancer Mother    Heart murmur Mother    Heart disease Mother    Angina Mother    Heart disease Father    COPD Father    Atrial fibrillation Brother    Cushing syndrome Daughter    Cancer Daughter        thyroid   Breast cancer Cousin    Hypotension Neg Hx    Malignant hyperthermia Neg Hx    Pseudochol deficiency Neg Hx    Family history: Family history reviewed and not pertinent  Prior to Admission medications   Medication Sig Start Date End Date Taking? Authorizing Provider  acetaminophen (TYLENOL) 325 MG tablet Take 1-2 tablets (325-650 mg total) by mouth every 6 (six) hours as needed for mild pain (pain score 1-3 or temp > 100.5). 03/11/21   Thornton Park, MD  albuterol (VENTOLIN HFA) 108 (90 Base) MCG/ACT inhaler Inhale 1-2 puffs into the lungs every 6 (six) hours as needed for wheezing or shortness of breath. 07/02/21   Parrett, Fonnie Mu, NP  amLODipine (NORVASC) 5 MG tablet Take 1 tablet (5 mg total) by mouth daily. 05/27/21   Jerrol Banana., MD  Budeson-Glycopyrrol-Formoterol (BREZTRI AEROSPHERE) 160-9-4.8 MCG/ACT AERO Inhale 160 mcg into the lungs in the morning and at bedtime. 09/30/21   Tyler Pita, MD  busPIRone (BUSPAR) 10 MG tablet Take 10 mg by mouth 2 (two) times daily.  05/09/17   [provider]  Calcium Carb-Cholecalciferol (CALCIUM 600+D3) 600-800 MG-UNIT TABS Take 1 tablet by mouth daily.     [provider]  citalopram (CELEXA) 40 MG tablet Take 40 mg by mouth daily. 08/06/15   [provider]  fexofenadine (ALLEGRA) 180 MG tablet Take 180 mg by mouth daily.    [provider]  LORazepam (ATIVAN) 0.5 MG tablet Take 1 tablet (0.5 mg total) by mouth 3 (three) times daily as needed for anxiety. 04/15/21   Jerrol Banana., MD  rosuvastatin (CRESTOR) 10 MG tablet TAKE 1 TABLET EVERY DAY 09/18/20   Jerrol Banana., MD  traZODone (DESYREL) 100 MG tablet Take 100 mg by mouth at  bedtime.    [provider]  vitamin B-12 (CYANOCOBALAMIN) 1000 MCG tablet Take 1,000 mcg by mouth daily.    [provider]   Physical Exam: Vitals:   10/18/21 1047 10/18/21 1100 10/18/21 1115 10/18/21 1200  BP: (!) 103/56 (!) 99/55 (!) 94/56 (!) 118/58  Pulse: 82 79 76 74  Resp: _0 Temp:      TempSrc:      SpO2: 97% 95% 95% 99%  Weight:      Height:       Constitutional: appears age-appropriate, frail, NAD, calm, comfortable Eyes: PERRL, lids and conjunctivae normal ENMT: Mucous membranes are dry. Posterior pharynx clear of any exudate or lesions. Age-appropriate dentition. Hearing appropriate Neck: normal, supple, no masses, no thyromegaly Respiratory: clear to auscultation bilaterally, no wheezing, no crackles. Normal respiratory effort. No accessory muscle use.  Cardiovascular: Increased rate and regular rhythm, no murmurs / rubs / gallops. No extremity edema. 2+ pedal pulses. No carotid bruits.  Abdomen: + Suprapubic tenderness, no masses palpated, no hepatosplenomegaly. Bowel sounds positive.  Musculoskeletal: no clubbing / cyanosis. No joint deformity upper and lower extremities. Good ROM, no contractures, no atrophy. Normal muscle tone.  Skin: no rashes, lesions, ulcers. No induration Neurologic: Sensation intact. Strength 4/5 in all 4.  Psychiatric: Unable to assess judgment and insight. Alert and oriented x 3.  Increased  somnolent  EKG: independently reviewed, showing sinus tachycardia with rate of 103, QTc 450  Chest x-ray on Admission: I personally reviewed and I agree with radiologist reading as below.  CT Abdomen Pelvis Wo Contrast  Result Date: 10/18/2021 CLINICAL DATA:  75 year old female with abdominal pain. Confusion since yesterday. Found down this morning. Diaphoretic. EXAM: CT ABDOMEN AND PELVIS WITHOUT CONTRAST TECHNIQUE: Multidetector CT imaging of the abdomen and pelvis was performed following the standard protocol without IV contrast. COMPARISON:  Portable chest radiograph this morning. CTA chest 04/04/2021. CT lumbar spine 10/04/2011. Lumbar MRI 12/17/2017 FINDINGS: Lower chest: Mild lung base scarring and/or atelectasis has not significantly changed since April. Chronic elevated right hemidiaphragm. No pericardial or pleural effusion. Small right side Bochdalek's hernia. Hepatobiliary: Negative noncontrast liver and gallbladder. Pancreas: Negative. Spleen: Negative. Adrenals/Urinary Tract: Normal adrenal glands. Right kidney and ureter appear nonobstructed. Right ureter is decompressed and negative. Urinary bladder is fully decompressed. But the left kidney appears inflamed, with mild to moderate pararenal space inflammatory stranding. There is lower pole nephrolithiasis or nephrocalcinosis. Mild if any associated hydronephrosis, but there is mild asymmetric hydroureter and periureteral stranding. However, no ureteral calculus is identified. Stomach/Bowel: Mild diverticulosis of the large bowel at the distal descending and sigmoid. No active inflammation. Redundant transverse colon with mild retained stool. Negative right colon. Normal retrocecal appendix on series 2, image 61. Decompressed and negative terminal ileum. No dilated small bowel. Small fat containing umbilical hernia. Negative stomach. Duodenal diverticulum at the 2nd portion but no active inflammation. No free air or free fluid.  Vascular/Lymphatic: Aortoiliac calcified atherosclerosis. Normal caliber abdominal aorta. No lymphadenopathy. Reproductive: Uterus appears to be surgically absent. No normal ovarian parenchyma identified, but there is a large, circumscribed, round 8.5 cm diameter cystic mass in the left hemipelvis with simple fluid density. This is in the region of the left adnexa. Other: No pelvic free fluid. Musculoskeletal: Hypoplastic ribs at T12. Chronic L4-L5 spondylolisthesis, decompression and fusion. Mild to moderate superior L3 compression fracture is new since 2019. Mild retropulsion of the posterosuperior endplate but no significant stenosis suspected. No other No acute osseous abnormality identified.  No superficial soft tissue injury identified. IMPRESSION: 1. Asymmetrically inflamed left kidney. But mild if any associated left hydronephrosis and hydroureter, and no obstructing calculus or lesion identified. Consider Left Pyelonephritis and/or Ascending UTI. CT Abdomen with IV contrast would best evaluate for pyelonephritis. 2. Large 8.5 cm cystic mass in the left hemipelvis appears to be ovarian in origin. Simple-appearing cyst, but not adequately characterized on this noncontrast CT. Recommend prompt follow-up with Pelvic US. Reference: JACR 2020 Feb;17(2):248-254 3. Mild to moderate L3 compression fracture is new since 2019 but age indeterminate. If specific therapy such as vertebroplasty is desired, Lumbar MRI without contrast or Nuclear Medicine Whole-body Bone Scan would best determine acuity. 4. No other acute traumatic injury identified in the noncontrast abdomen or pelvis. Aortic Atherosclerosis (ICD10-I70.0). Electronically Signed   By: Genevie Ann M.D.   On: 10/18/2021 11:50   DG Knee 2 Views Right  Result Date: 10/18/2021 CLINICAL DATA:  [place] this morning. EXAM: RIGHT KNEE - 1-2 VIEW COMPARISON:  03/09/2021 FINDINGS: Stable right total knee prosthesis. No fracture, dislocation or effusion seen. IMPRESSION:  No acute abnormality. Electronically Signed   By: Claudie Revering M.D.   On: 10/18/2021 09:42   CT HEAD WO CONTRAST (5MM)  Result Date: 10/18/2021 CLINICAL DATA:  Found on floor, possible fall EXAM: CT HEAD WITHOUT CONTRAST CT CERVICAL SPINE WITHOUT CONTRAST TECHNIQUE: Multidetector CT imaging of the head and cervical spine was performed following the standard protocol without intravenous contrast. Multiplanar CT image reconstructions of the cervical spine were also generated. COMPARISON:  None. FINDINGS: CT HEAD FINDINGS Brain: No evidence of acute infarction, hemorrhage, hydrocephalus, extra-axial collection or mass lesion/mass effect. Mild periventricular and deep white matter hypodensity. Vascular: No hyperdense vessel or unexpected calcification. Skull: Normal. Negative for fracture or focal lesion. Sinuses/Orbits: Partial opacification of the ethmoid air cells and sphenoid sinuses. Mucoid retention cyst of the left maxillary sinus. Other: None. CT CERVICAL SPINE FINDINGS Alignment: Degenerative straightening and reversal of the normal cervical lordosis. Skull base and vertebrae: No acute fracture. No primary bone lesion or focal pathologic process. Soft tissues and spinal canal: No prevertebral fluid or swelling. No visible canal hematoma. Disc levels: Focally moderate disc space height loss and osteophytosis of C5 through C7. Upper chest: Negative. Other: None. IMPRESSION: 1. No acute intracranial pathology. Small-vessel white matter disease. 2. No fracture or static subluxation of the cervical spine. 3. Focally moderate disc space height loss and osteophytosis of C5 through C7. Electronically Signed   By: Delanna Ahmadi M.D.   On: 10/18/2021 10:41   CT Cervical Spine Wo Contrast  Result Date: 10/18/2021 CLINICAL DATA:  Found on floor, possible fall EXAM: CT HEAD WITHOUT CONTRAST CT CERVICAL SPINE WITHOUT CONTRAST TECHNIQUE: Multidetector CT imaging of the head and cervical spine was performed following  the standard protocol without intravenous contrast. Multiplanar CT image reconstructions of the cervical spine were also generated. COMPARISON:  None. FINDINGS: CT HEAD FINDINGS Brain: No evidence of acute infarction, hemorrhage, hydrocephalus, extra-axial collection or mass lesion/mass effect. Mild periventricular and deep white matter hypodensity. Vascular: No hyperdense vessel or unexpected calcification. Skull: Normal. Negative for fracture or focal lesion. Sinuses/Orbits: Partial opacification of the ethmoid air cells and sphenoid sinuses. Mucoid retention cyst of the left maxillary sinus. Other: None. CT CERVICAL SPINE FINDINGS Alignment: Degenerative straightening and reversal of the normal cervical lordosis. Skull base and vertebrae: No acute fracture. No primary bone lesion or focal pathologic process. Soft tissues and spinal canal: No prevertebral fluid or swelling. No visible  canal hematoma. Disc levels: Focally moderate disc space height loss and osteophytosis of C5 through C7. Upper chest: Negative. Other: None. IMPRESSION: 1. No acute intracranial pathology. Small-vessel white matter disease. 2. No fracture or static subluxation of the cervical spine. 3. Focally moderate disc space height loss and osteophytosis of C5 through C7. Electronically Signed   By: Delanna Ahmadi M.D.   On: 10/18/2021 10:41   DG Chest Portable 1 View  Result Date: 10/18/2021 CLINICAL DATA:  Shortness of breath. EXAM: PORTABLE CHEST 1 VIEW COMPARISON:  07/29/2021 FINDINGS: Normal sized heart. Clear lungs. Progressive right diaphragmatic eventration. Unremarkable bones. IMPRESSION: No active disease. Electronically Signed   By: Claudie Revering M.D.   On: 10/18/2021 09:40    Labs on Admission: I have personally reviewed following labs  CBC: Recent Labs  Lab 10/18/21 0903  WBC 12.9*  HGB 14.3  HCT 42.1  MCV 88.3  PLT 323*   Basic Metabolic Panel: Recent Labs  Lab 10/18/21 0903  NA 134*  K 3.4*  CL 101  CO2 21*   GLUCOSE 171*  BUN 48*  CREATININE 2.18*  CALCIUM 8.3*   GFR: Estimated Creatinine Clearance: 21.7 mL/min (A) (by C-G formula based on SCr of 2.18 mg/dL (H)).  Liver Function Tests: Recent Labs  Lab 10/18/21 0903  AST 636*  ALT 162*  ALKPHOS 80  BILITOT 0.9  PROT 6.1*  ALBUMIN 3.1*   Cardiac Enzymes: Recent Labs  Lab 10/18/21 0903  CKTOTAL 14,929*   BNP (last 3 results) Recent Labs    07/29/21 1522  PROBNP 293   Urine analysis:    Component Value Date/Time   COLORURINE YELLOW (A) 02/26/2021 0907   APPEARANCEUR CLEAR (A) 02/26/2021 0907   APPEARANCEUR Clear 10/04/2013 1740   LABSPEC 1.017 02/26/2021 0907   LABSPEC 1.011 10/04/2013 1740   PHURINE 5.0 02/26/2021 0907   GLUCOSEU NEGATIVE 02/26/2021 0907   GLUCOSEU Negative 10/04/2013 1740   HGBUR NEGATIVE 02/26/2021 0907   BILIRUBINUR NEGATIVE 02/26/2021 0907   BILIRUBINUR Negative 01/18/2018 1417   BILIRUBINUR Negative 10/04/2013 1740   KETONESUR NEGATIVE 02/26/2021 0907   PROTEINUR NEGATIVE 02/26/2021 0907   UROBILINOGEN 0.2 01/18/2018 1417   UROBILINOGEN 2.0 (H) 11/26/2011 2358   NITRITE NEGATIVE 02/26/2021 0907   LEUKOCYTESUR NEGATIVE 02/26/2021 0907   LEUKOCYTESUR Trace 10/04/2013 1740   CRITICAL CARE Performed by: Briant Cedar Joanmarie Tsang  Total critical care time: 40 minutes  Critical care time was exclusive of separately billable procedures and treating other patients.  Critical care was necessary to treat or prevent imminent or life-threatening deterioration.  Critical care was time spent personally by me on the following activities: development of treatment plan with patient and/or surrogate as well as nursing, discussions with consultants, evaluation of patient's response to treatment, examination of patient, obtaining history from patient or surrogate, ordering and performing treatments and interventions, ordering and review of laboratory studies, ordering and review of radiographic studies, pulse oximetry and  re-evaluation of patient's condition.  Dr. Tobie Poet [facility]  If 7PM-7AM, please contact overnight-coverage provider If 7AM-7PM, please contact day coverage provider [email or web address]  10/18/2021, 12:52 PM

## 2021-10-18 NOTE — Sepsis Progress Note (Signed)
Secure chat with bedside nurse regarding need for repeat lactic acid.

## 2021-10-18 NOTE — ED Provider Notes (Signed)
[facility] Emergency Department Provider Note   ____________________________________________   Event Date/Time   First MD Initiated Contact with Patient 10/18/21 [phone]     (approximate)  I have reviewed the triage vital signs and the nursing notes.   HISTORY  Chief Complaint Fall and Altered Mental Status    HPI Nancy Blackburn is a 75 y.o. female with past medical history of hypertension, hyperlipidemia, parkinsonism, and dementia who presents to the ED for altered mental status and fall.  Patient presents to the ED with her grandson, whom she lives with.  He states that she seemed weaker than usual and slightly confused yesterday, wanted to spend almost the entire day in bed and was unable to go to church like usual.  He then went to check on her this morning and found her down on the ground in her bathroom.  She is unsure whether she hit her head or lost consciousness, does not remember falling.  She does complain of pain in her right knee, on which she had surgery a couple of months ago.  She is unsure whether she hit her knee when she fell.  Nancy Blackburn states that she has not had any fever, cough, difficulty breathing, vomiting, diarrhea, or dysuria recently.        Past Medical History:  Diagnosis Date   Asthma    Bradycardia    Cervical cancer ([place]) 1978   Chronic sinus infection    Ejection fraction    a. EF 55-65%, echo, mild LVH, June, 2013, small pericardial effusion   Fracture, sacrum/coccyx ([place])    History of kidney stones    Hyperlipidemia    a. takes Simvastatin   Hypertension    a. takes Metoprolol, Lisinopril, and amlodipine.   Low back pain    Mild dementia    Nasal polyps    Normal nuclear stress test    a. 07/2005 Nl nuc stress test.   Osteopenia    Osteoporosis    Palpitations    a. June 2013:  Event recorder normal sinus rhythm with rare PVCs - ? symptomatic PVC's.   Parkinson disease ([place])    Pericardial effusion     a. Small, echo, June, 2013   PONV (postoperative nausea and vomiting)     Patient Active Problem List   Diagnosis Date Noted   Severe sepsis with acute organ dysfunction ([place]) 10/18/2021   Asthma 07/02/2021   S/P TKR (total knee replacement) using cement, right 03/09/2021   Bradycardia 07/03/2020   Postural dizziness with presyncope 07/01/2020   Family history of colon cancer in mother 02/22/2018   Cervical spondylosis 05/04/2017   Cervical radiculopathy 05/04/2017   Family history of Cushing disease 01/12/2017   B12 deficiency 12/18/2016   Mild cognitive impairment with memory loss 12/18/2016   Parkinsonian features 12/18/2016   Vitamin D deficiency 12/18/2016   Chalazion, bilateral 06/08/2016   Absolute anemia 07/30/2015   Edema extremities 07/30/2015   Benign essential tremor 07/30/2015   Anxiety, generalized 07/30/2015   Gastro-esophageal reflux disease without esophagitis 07/30/2015   HLD (hyperlipidemia) 07/30/2015   Cannot sleep 07/30/2015   Lumbar radiculopathy 07/30/2015   Depression, major, recurrent, in partial remission ([place]) 07/30/2015   Arthritis, degenerative 07/30/2015   Allergic rhinitis 07/30/2015   Gastroduodenal ulcer 07/30/2015   Calcium blood increased 07/30/2015   Pre-syncope 05/17/2015   Dyspnea 03/31/2015   Bleeding stomach ulcer 10/14/2013   Anemia, iron deficiency 10/14/2013   Weakness generalized 10/14/2013   Ejection fraction  Pericardial effusion    Palpitations    Low back pain    Normal nuclear stress test     Past Surgical History:  Procedure Laterality Date   BACK SURGERY  08/2010   BLADDER SUSPENSION  2003   bone spur  2009   removed from right collar bone area and partial collar bone removed   CATARACT EXTRACTION W/ INTRAOCULAR LENS  IMPLANT, BILATERAL     EYE SURGERY     LUMBAR FUSION  2011, 2012   SHOULDER ARTHROSCOPY Right 2012   THYROIDECTOMY, PARTIAL  1976   TONSILLECTOMY     as child   TOTAL KNEE ARTHROPLASTY Right  03/09/2021   Procedure: Right TOTAL KNEE ARTHROPLASTY;  Surgeon: Thornton Park, MD;  Location: [facility] ORS;  Service: Orthopedics;  Laterality: Right;   TUBAL LIGATION  1977   VAGINAL HYSTERECTOMY  1978   d/t cervical cancer    Prior to Admission medications   Medication Sig Start Date End Date Taking? Authorizing Provider  acetaminophen (TYLENOL) 325 MG tablet Take 1-2 tablets (325-650 mg total) by mouth every 6 (six) hours as needed for mild pain (pain score 1-3 or temp > 100.5). 03/11/21   Thornton Park, MD  albuterol (VENTOLIN HFA) 108 (90 Base) MCG/ACT inhaler Inhale 1-2 puffs into the lungs every 6 (six) hours as needed for wheezing or shortness of breath. 07/02/21   Parrett, Fonnie Mu, NP  amLODipine (NORVASC) 5 MG tablet Take 1 tablet (5 mg total) by mouth daily. 05/27/21   Jerrol Banana., MD  Budeson-Glycopyrrol-Formoterol (BREZTRI AEROSPHERE) 160-9-4.8 MCG/ACT AERO Inhale 160 mcg into the lungs in the morning and at bedtime. 09/30/21   Tyler Pita, MD  busPIRone (BUSPAR) 10 MG tablet Take 10 mg by mouth 2 (two) times daily.  05/09/17   [provider]  Calcium Carb-Cholecalciferol (CALCIUM 600+D3) 600-800 MG-UNIT TABS Take 1 tablet by mouth daily.    [provider]  citalopram (CELEXA) 40 MG tablet Take 40 mg by mouth daily. 08/06/15   [provider]  fexofenadine (ALLEGRA) 180 MG tablet Take 180 mg by mouth daily.    [provider]  LORazepam (ATIVAN) 0.5 MG tablet Take 1 tablet (0.5 mg total) by mouth 3 (three) times daily as needed for anxiety. 04/15/21   Jerrol Banana., MD  rosuvastatin (CRESTOR) 10 MG tablet TAKE 1 TABLET EVERY DAY 09/18/20   Jerrol Banana., MD  traZODone (DESYREL) 100 MG tablet Take 100 mg by mouth at bedtime.    [provider]  vitamin B-12 (CYANOCOBALAMIN) 1000 MCG tablet Take 1,000 mcg by mouth daily.    [provider]    Allergies Anti-inflammatory enzyme [nutritional  supplements], Cymbalta [duloxetine hcl], Clarithromycin, Donepezil hcl, Nsaids, Ranitidine, and Venlafaxine  Family History  Problem Relation Age of Onset   Colon cancer Mother    Heart murmur Mother    Heart disease Mother    Angina Mother    Heart disease Father    COPD Father    Atrial fibrillation Brother    Cushing syndrome Daughter    Cancer Daughter        thyroid   Breast cancer Cousin    Hypotension Neg Hx    Malignant hyperthermia Neg Hx    Pseudochol deficiency Neg Hx     Social History Social History   Tobacco Use   Smoking status: Never   Smokeless tobacco: Never  Vaping Use   Vaping Use: Never used  Substance  Use Topics   Alcohol use: Yes    Alcohol/week: 0.0 standard drinks    Comment: rarely   Drug use: No    Review of Systems  Constitutional: No fever/chills.  Positive for fall and confusion. Eyes: No visual changes. ENT: No sore throat. Cardiovascular: Denies chest pain. Respiratory: Denies shortness of breath. Gastrointestinal: No abdominal pain.  No nausea, no vomiting.  No diarrhea.  No constipation. Genitourinary: Negative for dysuria. Musculoskeletal: Negative for back pain.  Positive for right knee pain. Skin: Negative for rash. Neurological: Negative for headaches, focal weakness or numbness.  ____________________________________________   PHYSICAL EXAM:  VITAL SIGNS: ED Triage Vitals  Enc Vitals Group     BP 10/18/21 0852 (!) 82/61     Pulse Rate 10/18/21 0852 (!) 106     Resp 10/18/21 0852 (!) 22     Temp 10/18/21 0852 98.5 F (36.9 C)     Temp Source 10/18/21 0852 Oral     SpO2 10/18/21 0852 (!) 87 %     Weight 10/18/21 0853 160 lb (72.6 kg)     Height 10/18/21 0853 5\' 7"  (1.702 m)     Head Circumference --      [place] --      Pain Score --      Pain Loc --      Pain Edu? --      Excl. in [place]? --     Constitutional: Somnolent but arousable to voice, oriented to person, place, time, and situation. Eyes:  Conjunctivae are normal.  Pupils equal, round, and reactive to light bilaterally. Head: Atraumatic. Nose: No congestion/rhinnorhea. Mouth/Throat: Mucous membranes are moist. Neck: Normal ROM Cardiovascular: Tachycardic, regular rhythm. Grossly normal heart sounds.  2+ radial pulses bilaterally. Respiratory: Normal respiratory effort.  No retractions. Lungs CTAB. Gastrointestinal: Soft and nontender. No distention. Genitourinary: deferred Musculoskeletal: No lower extremity tenderness nor edema.  Diffuse tenderness to palpation of right knee with mild edema but no erythema or warmth.  Range of motion intact to right knee with minimal pain. Neurologic:  Normal speech and language. No gross focal neurologic deficits are appreciated. Skin:  Skin is warm, dry and intact. No rash noted. Psychiatric: Mood and affect are normal. Speech and behavior are normal.  ____________________________________________   LABS (all labs ordered are listed, but only abnormal results are displayed)  Labs Reviewed  COMPREHENSIVE METABOLIC PANEL - Abnormal; Notable for the following components:      Result Value   Sodium 134 (*)    Potassium 3.4 (*)    CO2 21 (*)    Glucose, Bld 171 (*)    BUN 48 (*)    Creatinine, Ser 2.18 (*)    Calcium 8.3 (*)    Total Protein 6.1 (*)    Albumin 3.1 (*)    AST 636 (*)    ALT 162 (*)    GFR, Estimated 23 (*)    All other components within normal limits  CBC - Abnormal; Notable for the following components:   WBC 12.9 (*)    Platelets 131 (*)    All other components within normal limits  BRAIN NATRIURETIC PEPTIDE - Abnormal; Notable for the following components:   B Natriuretic Peptide 229.4 (*)    All other components within normal limits  LACTIC ACID, PLASMA - Abnormal; Notable for the following components:   Lactic Acid, Venous 2.1 (*)    All other components within normal limits  CK - Abnormal; Notable for the following components:  Total CK 14,929 (*)     All other components within normal limits  TROPONIN I (HIGH SENSITIVITY) - Abnormal; Notable for the following components:   Troponin I (High Sensitivity) 292 (*)    All other components within normal limits  TROPONIN I (HIGH SENSITIVITY) - Abnormal; Notable for the following components:   Troponin I (High Sensitivity) 227 (*)    All other components within normal limits  CULTURE, BLOOD (ROUTINE X 2)  CULTURE, BLOOD (ROUTINE X 2)  RESP PANEL BY RT-PCR (FLU A&B, COVID) ARPGX2  PROCALCITONIN  LACTIC ACID, PLASMA  URINALYSIS, ROUTINE W REFLEX MICROSCOPIC  MAGNESIUM  PHOSPHORUS  VITAMIN B12  VITAMIN B1  VITAMIN D 25 HYDROXY (VIT D DEFICIENCY, FRACTURES)   ____________________________________________  EKG  ED ECG REPORT I, Blake Divine, the attending physician, personally viewed and interpreted this ECG.   Date: 10/18/2021  EKG Time: 8:55  Rate: 103  Rhythm: sinus tachycardia  Axis: LAD  Intervals:left anterior fascicular block  ST&T Change: T wave inversions laterally   PROCEDURES  Procedure(s) performed (including Critical Care):  .Critical Care Performed by: Blake Divine, MD Authorized by: Blake Divine, MD   Critical care provider statement:    Critical care time (minutes):  45   Critical care time was exclusive of:  Separately billable procedures and treating other patients and teaching time   Critical care was necessary to treat or prevent imminent or life-threatening deterioration of the following conditions:  Respiratory failure and sepsis   Critical care was time spent personally by me on the following activities:  Review of old charts, re-evaluation of patient's condition, pulse oximetry, ordering and review of radiographic studies, ordering and review of laboratory studies, ordering and performing treatments and interventions, development of treatment plan with patient or surrogate, evaluation of patient's response to treatment and examination of patient    I assumed direction of critical care for this patient from another provider in my specialty: no     Care discussed with: admitting provider     ____________________________________________   INITIAL IMPRESSION / ASSESSMENT AND PLAN / ED COURSE      75 year old female with past medical history of hypertension, hyperlipidemia, parkinsonism, and dementia who presents to the ED for increasing weakness and confusion developing since yesterday, was then found down on the ground by her grandson this morning.  Patient is somnolent but arousable to voice, has no focal neurologic deficits on exam and I have low suspicion for stroke.  Patient initially noted to be borderline hypotensive, however this improved without intervention.  Patient noted to be hypoxic on room air, improved on 2 L nasal cannula.  We will start infectious work-up, also check CT head and cervical spine given her fall this morning.  We will check x-rays of right knee but she is neurovascular intact to her right lower extremity and there are no signs of a septic arthritis.  Labs remarkable for AKI and elevated CK level, we will continue IV fluid resuscitation for rhabdomyolysis.  I am also concerned for sepsis given her elevated procalcitonin and WBC.  We will start on broad-spectrum antibiotics, CT of her abdomen/pelvis performed and remarkable for inflammatory changes tracking up towards her left kidney.  Therefore, pyelonephritis seems to be the most likely source of her sepsis although UA is still pending at this time.  Chest x-ray reviewed by me and shows no infiltrate, edema, or effusion.  CT head and cervical spine are negative for acute traumatic injury.  X-ray of right knee  is also unremarkable.  Case discussed with hospitalist for admission.      ____________________________________________   FINAL CLINICAL IMPRESSION(S) / ED DIAGNOSES  Final diagnoses:  Sepsis with acute renal failure without septic shock, due to  unspecified organism, unspecified acute renal failure type ([place])  Altered mental status, unspecified altered mental status type     ED Discharge Orders     None        Note:  This document was prepared using Dragon voice recognition software and may include unintentional dictation errors.    Blake Divine, MD 10/18/21 1231

## 2021-10-18 NOTE — ED Notes (Signed)
Pt resting sleeping, resting comfortably in bed, NAD, chest rise & fall. No needs identified at this time. Grandson at [facility]. Bed low & locked; call light & personal items within reach.

## 2021-10-18 NOTE — ED Triage Notes (Signed)
Pt to ED with grandson. Reports confusion that started yesterday, pt found on the floor this am. Was confused before fall. Not baseline for cognition. No blood thinner use.  Pt diaphoretic in triage, struggling to get words out

## 2021-10-18 NOTE — ED Notes (Signed)
Patient to CT at this time

## 2021-10-18 NOTE — Progress Notes (Signed)
CODE SEPSIS - PHARMACY COMMUNICATION  **Broad Spectrum Antibiotics should be administered within 1 hour of Sepsis diagnosis**  Time Code Sepsis Called/Page Received: 1110  Antibiotics Ordered: Cefepime + vancomycin + metronidazole  Time of 1st antibiotic administration: 1158  Additional action taken by pharmacy: N/A  Benita Gutter 10/18/2021  11:13 AM

## 2021-10-18 NOTE — Consult Note (Signed)
PHARMACY -  BRIEF ANTIBIOTIC NOTE   Pharmacy has received consult(s) for cefepime and vancomycin from an ED provider. Patient is also ordered metronidazole. The patient's profile has been reviewed for ht/wt/allergies/indication/available labs.    One time order(s) placed for  --Cefepime 2 g IV  --Vancomycin 1 g IV  Further antibiotics/pharmacy consults should be ordered by admitting physician if indicated.                       Thank you, Benita Gutter 10/18/2021  11:12 AM

## 2021-10-18 NOTE — ED Notes (Signed)
Pt O2 sat 86%. Pt placed on 2L [place] per Jessup MD. O2 sat 95% at this time

## 2021-10-18 NOTE — Sepsis Progress Note (Signed)
Sepsis protocol is being monitored by eLink. 

## 2021-10-18 NOTE — Consult Note (Addendum)
Pharmacy Antibiotic Note  Nancy Blackburn is a 75 y.o. female with medical history including essential tremor / Parkinsonian features, PUD, depression / anxiety, GERD, palpitations, IDA admitted on 10/18/2021 with  acute metabolic encephalopathy and sepsis . Per chart review, acute change in mental status since 11/6 and then patient found on floor this morning. Labs notable for AKI, transaminitis, rhabdomyolysis, elevated PCT, elevated troponin, leukocytosis. Infectious source unclear at this time. Pharmacy has been consulted for cefepime and vancomycin dosing. Patient is also ordered metronidazole.   Plan:  Cefepime 2 g IV q24h  Vancomycin 1.5 g IV LD x 1 --Will dose per levels for now given unstable renal function --Anticipate improvement in renal function with fluid resuscitation. May be able to start maintenance regimen in next 1-2 days if therapy continued --Daily Scr per protocol  Height: 5\' 7"  (170.2 cm) Weight: 72.6 kg (160 lb) IBW/kg (Calculated) : 61.6  Temp (24hrs), Avg:98.5 F (36.9 C), Min:98.5 F (36.9 C), Max:98.5 F (36.9 C)  Recent Labs  Lab 10/18/21 0903 10/18/21 0937  WBC 12.9*  --   CREATININE 2.18*  --   LATICACIDVEN  --  2.1*    Estimated Creatinine Clearance: 21.7 mL/min (A) (by C-G formula based on SCr of 2.18 mg/dL (H)).    Allergies  Allergen Reactions   Anti-Inflammatory Enzyme [Nutritional Supplements] Other (See Comments)    REACTION: Due to bleeding ulcer   Cymbalta [Duloxetine Hcl] Other (See Comments)    REACTION: Urinary retention   Clarithromycin Other (See Comments)    Upsets stomach   Donepezil Hcl Other (See Comments)    Significant bradycardia causing syncope   Nsaids     GI BLEED   Ranitidine     severe diarrhea   Venlafaxine     severe depression    Antimicrobials this admission: Azithromycin 11/7 >> Cefepime 11/7 >>  Vancomycin 11/7 >>  Metronidazole 11/7 >>   Dose adjustments this admission: N/A  Microbiology  results: 11/7 BCx: NGTD 11/7 MRSA PCR: pending  Thank you for allowing pharmacy to be a part of this patient's care.  Benita Gutter 10/18/2021 12:37 PM

## 2021-10-18 NOTE — ED Notes (Signed)
Jessup MD made aware of pts lactic acid of 2.1

## 2021-10-18 NOTE — Progress Notes (Addendum)
[facility] - No charge  Patient has a card on her person thus his MRI is unsafe.  Patient has a medical device implant (noncardiac) per nursing staff.  MRI of the brain without contrast has been canceled.

## 2021-10-19 ENCOUNTER — Encounter: Payer: Self-pay | Admitting: Internal Medicine

## 2021-10-19 ENCOUNTER — Inpatient Hospital Stay: Payer: Medicare Other

## 2021-10-19 DIAGNOSIS — G9341 Metabolic encephalopathy: Secondary | ICD-10-CM | POA: Insufficient documentation

## 2021-10-19 DIAGNOSIS — N179 Acute kidney failure, unspecified: Secondary | ICD-10-CM | POA: Diagnosis not present

## 2021-10-19 DIAGNOSIS — E785 Hyperlipidemia, unspecified: Secondary | ICD-10-CM

## 2021-10-19 DIAGNOSIS — M6282 Rhabdomyolysis: Secondary | ICD-10-CM

## 2021-10-19 DIAGNOSIS — A4159 Other Gram-negative sepsis: Secondary | ICD-10-CM | POA: Diagnosis not present

## 2021-10-19 DIAGNOSIS — A419 Sepsis, unspecified organism: Secondary | ICD-10-CM | POA: Diagnosis not present

## 2021-10-19 LAB — URINALYSIS, COMPLETE (UACMP) WITH MICROSCOPIC
Bilirubin Urine: NEGATIVE
Glucose, UA: 50 mg/dL — AB
Ketones, ur: NEGATIVE mg/dL
Nitrite: NEGATIVE
Protein, ur: 100 mg/dL — AB
Specific Gravity, Urine: 1.015 (ref 1.005–1.030)
WBC, UA: >50 WBC/hpf — ABNORMAL HIGH (ref 0–5)
pH: 5 (ref 5.0–8.0)

## 2021-10-19 LAB — BLOOD CULTURE ID PANEL (REFLEXED) - BCID2

## 2021-10-19 LAB — CBC WITH DIFFERENTIAL/PLATELET
Abs Immature Granulocytes: 0.05 10*3/uL (ref 0.00–0.07)
Basophils Absolute: 0 10*3/uL (ref 0.0–0.1)
Basophils Relative: 0 %
Eosinophils Absolute: 0 10*3/uL (ref 0.0–0.5)
Eosinophils Relative: 0 %
HCT: 38.4 % (ref 36.0–46.0)
Hemoglobin: 12.5 g/dL (ref 12.0–15.0)
Immature Granulocytes: 1 %
Lymphocytes Relative: 9 %
Lymphs Abs: 0.9 10*3/uL (ref 0.7–4.0)
MCH: 29.8 pg (ref 26.0–34.0)
MCHC: 32.6 g/dL (ref 30.0–36.0)
MCV: 91.6 fL (ref 80.0–100.0)
Monocytes Absolute: 1 10*3/uL (ref 0.1–1.0)
Monocytes Relative: 10 %
Neutro Abs: 7.9 10*3/uL — ABNORMAL HIGH (ref 1.7–7.7)
Neutrophils Relative %: 80 %
Platelets: 106 10*3/uL — ABNORMAL LOW (ref 150–400)
RBC: 4.19 MIL/uL (ref 3.87–5.11)
RDW: 13.4 % (ref 11.5–15.5)
Smear Review: NORMAL
WBC: 9.9 10*3/uL (ref 4.0–10.5)
nRBC: 0 % (ref 0.0–0.2)

## 2021-10-19 LAB — HEPATIC FUNCTION PANEL
ALT: 135 U/L — ABNORMAL HIGH (ref 0–44)
AST: 380 U/L — ABNORMAL HIGH (ref 15–41)
Albumin: 2.4 g/dL — ABNORMAL LOW (ref 3.5–5.0)
Alkaline Phosphatase: 69 U/L (ref 38–126)
Bilirubin, Direct: 0.1 mg/dL (ref 0.0–0.2)
Indirect Bilirubin: 0.7 mg/dL (ref 0.3–0.9)
Total Bilirubin: 0.8 mg/dL (ref 0.3–1.2)
Total Protein: 5.5 g/dL — ABNORMAL LOW (ref 6.5–8.1)

## 2021-10-19 LAB — BASIC METABOLIC PANEL
Anion gap: 11 (ref 5–15)
BUN: 57 mg/dL — ABNORMAL HIGH (ref 8–23)
CO2: 20 mmol/L — ABNORMAL LOW (ref 22–32)
Calcium: 7.6 mg/dL — ABNORMAL LOW (ref 8.9–10.3)
Chloride: 103 mmol/L (ref 98–111)
Creatinine, Ser: 2.96 mg/dL — ABNORMAL HIGH (ref 0.44–1.00)
GFR, Estimated: 16 mL/min — ABNORMAL LOW (ref >60)
Glucose, Bld: 100 mg/dL — ABNORMAL HIGH (ref 70–99)
Potassium: 3.6 mmol/L (ref 3.5–5.1)
Sodium: 134 mmol/L — ABNORMAL LOW (ref 135–145)

## 2021-10-19 LAB — CK: Total CK: 10710 U/L — ABNORMAL HIGH (ref 38–234)

## 2021-10-19 LAB — CORTISOL-AM, BLOOD: Cortisol - AM: 25.7 ug/dL — ABNORMAL HIGH (ref 6.7–22.6)

## 2021-10-19 LAB — PROCALCITONIN: Procalcitonin: 40.48 ng/mL

## 2021-10-19 LAB — PROTIME-INR
INR: 1 (ref 0.8–1.2)
Prothrombin Time: 12.9 seconds (ref 11.4–15.2)

## 2021-10-19 MED ORDER — VITAMIN B-12 1000 MCG PO TABS
1000.0000 ug | ORAL_TABLET | Freq: Every day | ORAL | Status: DC
Start: 2021-10-19 — End: 2021-10-26
  Administered 2021-10-19 – 2021-10-26 (×8): 1000 ug via ORAL
  Filled 2021-10-19 (×8): qty 1

## 2021-10-19 MED ORDER — LORATADINE 10 MG PO TABS
10.0000 mg | ORAL_TABLET | Freq: Every day | ORAL | Status: DC
  Administered 2021-10-19 – 2021-10-26 (×8): 10 mg via ORAL
  Filled 2021-10-19 (×8): qty 1

## 2021-10-19 MED ORDER — LORAZEPAM 0.5 MG PO TABS
0.5000 mg | ORAL_TABLET | Freq: Three times a day (TID) | ORAL | Status: DC | PRN
  Administered 2021-10-19 – 2021-10-26 (×12): 0.5 mg via ORAL
  Filled 2021-10-19 (×13): qty 1

## 2021-10-19 MED ORDER — OXYCODONE HCL 5 MG PO TABS
2.5000 mg | ORAL_TABLET | ORAL | Status: DC | PRN
  Administered 2021-10-19 – 2021-10-25 (×16): 2.5 mg via ORAL
  Filled 2021-10-19 (×17): qty 1

## 2021-10-19 MED ORDER — TRAZODONE HCL 50 MG PO TABS
100.0000 mg | ORAL_TABLET | Freq: Every day | ORAL | Status: DC
  Administered 2021-10-19 – 2021-10-25 (×7): 100 mg via ORAL
  Filled 2021-10-19 (×7): qty 2

## 2021-10-19 MED ORDER — LACTATED RINGERS IV SOLN: INTRAVENOUS | Status: DC

## 2021-10-19 MED ORDER — BUDESON-GLYCOPYRROL-FORMOTEROL 160-9-4.8 MCG/ACT IN AERO: 160.0000 ug | INHALATION_SPRAY | Freq: Two times a day (BID) | RESPIRATORY_TRACT | Status: DC

## 2021-10-19 MED ORDER — CITALOPRAM HYDROBROMIDE 20 MG PO TABS
40.0000 mg | ORAL_TABLET | Freq: Every day | ORAL | Status: DC
  Administered 2021-10-19 – 2021-10-26 (×8): 40 mg via ORAL
  Filled 2021-10-19 (×8): qty 2

## 2021-10-19 MED ORDER — BUSPIRONE HCL 10 MG PO TABS
10.0000 mg | ORAL_TABLET | Freq: Two times a day (BID) | ORAL | Status: DC
  Administered 2021-10-19 – 2021-10-26 (×15): 10 mg via ORAL
  Filled 2021-10-19: qty 2
  Filled 2021-10-19 (×16): qty 1

## 2021-10-19 MED ORDER — SODIUM CHLORIDE 0.9 % IV SOLN
2.0000 g | INTRAVENOUS | Status: DC
  Administered 2021-10-19 – 2021-10-22 (×4): 2 g via INTRAVENOUS
  Filled 2021-10-19 (×3): qty 2
  Filled 2021-10-19 (×2): qty 20

## 2021-10-19 MED ORDER — VITAMIN D 25 MCG (1000 UNIT) PO TABS
1000.0000 [IU] | ORAL_TABLET | Freq: Every day | ORAL | Status: DC
Start: 2021-10-19 — End: 2021-10-26
  Administered 2021-10-19 – 2021-10-26 (×8): 1000 [IU] via ORAL
  Filled 2021-10-19 (×8): qty 1

## 2021-10-19 NOTE — Progress Notes (Signed)
Patient ID: Nancy Blackburn, female   DOB: [birth year], 75 y.o.   MRN: [id number] [facility] PROGRESS NOTE  CAROLLYNN PENNYWELL YSA:[id number] DOB: 01-24-1946 DOA: 10/18/2021 PCP: Jerrol Banana., MD  HPI/Subjective: Patient is alert and awake.  She came in not feeling well.  Started on Sunday night.  Was found on Monday morning.  She was on the floor for an undetermined amount of time.  Admitted with sepsis, acute kidney injury and rhabdomyolysis.  Objective: Vitals:   10/19/21 0900 10/19/21 1100  BP: 124/84 130/80  Pulse: 65 73  Resp: (!) 23 (!) 23  Temp:    SpO2: 98% 96%    Intake/Output Summary (Last 24 hours) at 10/19/2021 1311 Last data filed at 10/19/2021 1225 Gross per 24 hour  Intake 1395.2 ml  Output 400 ml  Net 995.2 ml   Filed Weights   10/18/21 0853  Weight: 72.6 kg    ROS: Review of Systems  Respiratory:  Negative for shortness of breath.   Cardiovascular:  Negative for chest pain.  Gastrointestinal:  Negative for abdominal pain, nausea and vomiting.  Musculoskeletal:  Positive for back pain.  Neurological:  Positive for tremors.  Exam: Physical Exam HENT:     Head: Normocephalic.     Mouth/Throat:     Pharynx: No oropharyngeal exudate.  Eyes:     General: Lids are normal.     Conjunctiva/sclera: Conjunctivae normal.  Cardiovascular:     Rate and Rhythm: Normal rate and regular rhythm.     Heart sounds: Normal heart sounds, S1 normal and S2 normal.  Pulmonary:     Breath sounds: Normal breath sounds. No decreased breath sounds, wheezing, rhonchi or rales.  Abdominal:     Palpations: Abdomen is soft.     Tenderness: There is no abdominal tenderness.  Musculoskeletal:     Right lower leg: Swelling present.     Left lower leg: Swelling present.  Skin:    General: Skin is warm.     Findings: No rash.  Neurological:     Mental Status: She is alert and oriented to person, place, and time.     Comments: Able to straight leg raise  bilaterally.  Positive tremor.      Scheduled Meds:  Budeson-Glycopyrrol-Formoterol  160 mcg Inhalation BID   busPIRone  10 mg Oral BID   cholecalciferol  1,000 Units Oral Daily   citalopram  40 mg Oral Daily   heparin  5,000 Units Subcutaneous Q8H   loratadine  10 mg Oral Daily   methylPREDNISolone acetate  40 mg Intramuscular Once   traZODone  100 mg Oral QHS   vitamin B-12  1,000 mcg Oral Daily   Continuous Infusions:  cefTRIAXone (ROCEPHIN)  IV Stopped (10/19/21 1148)   lactated ringers 125 mL/hr at 10/19/21 3235    Assessment/Plan:  Severe sepsis with acute kidney injury, hypotension and acute metabolic encephalopathy.  Proteus growing out of the blood cultures.  Continue Rocephin.  In and out catheterization done which showed positive urine analysis today.  Patient had tachypnea, tachycardia and leukocytosis. Acute kidney injury.  IV fluid hydration.  Renal ultrasound.  Nephrology consultation Rhabdomyolysis.  Hold cholesterol medication.  IV fluid hydration Elevated liver function test likely from sepsis.  Continue to monitor.  Trending better today than yesterday. Acute metabolic encephalopathy has improved.  Restart diet. Elevated troponin likely secondary to rhabdomyolysis rather than NSTEMI. Hyperlipidemia unspecified.  Hold Crestor at this time Asthma.  Continue inhaler Parkinson's disease Weakness.  Physical  therapy evaluation        Code Status:     Code Status Orders  (From admission, onward)           Start     Ordered   10/18/21 1220  Full code  Continuous        10/18/21 1223           Code Status History     Date Active Date Inactive Code Status Order ID Comments User Context   03/09/2021 1232 03/12/2021 2121 Full Code [id number]  Thornton Park, MD Inpatient   07/01/2020 2328 07/03/2020 2053 Full Code [id number]  Mansy, Arvella Merles, MD ED      Family Communication: Family at bedside Disposition Plan: Status is:  Inpatient  Consultants: Nephrology  Antibiotics: Rocephin  Time spent: 28 minutes  [place]

## 2021-10-19 NOTE — ED Notes (Signed)
Pt resting in bed, NAD, chest rise & fall. No needs verbalized at this time. Bed low & locked; call light & personal items within reach. Bed side table cleared off.

## 2021-10-19 NOTE — Progress Notes (Signed)
PHARMACY - PHYSICIAN COMMUNICATION CRITICAL VALUE ALERT - BLOOD CULTURE IDENTIFICATION (BCID)  Nancy Blackburn is an 75 y.o. female who presented to [facility] on 10/18/2021 with a chief complaint of sepsis  Assessment:  Proteus species in 1 of 4 bottles, no resistance detected.  (include suspected source if known)  Name of physician (or Provider) Contacted: Hal Hope   Current antibiotics: Vanc, Cefepime, metronidazole  Changes to prescribed antibiotics recommended:  Will d/c current regimen and start ceftriaxone 2 gm IV Q24H  Results for orders placed or performed during the hospital encounter of 10/18/21  Blood Culture ID Panel (Reflexed) (Collected: 10/18/2021  9:37 AM)  Result Value Ref Range   Enterococcus faecalis NOT DETECTED NOT DETECTED   Enterococcus Faecium NOT DETECTED NOT DETECTED   Listeria monocytogenes NOT DETECTED NOT DETECTED   Staphylococcus species NOT DETECTED NOT DETECTED   Staphylococcus aureus (BCID) NOT DETECTED NOT DETECTED   Staphylococcus epidermidis NOT DETECTED NOT DETECTED   Staphylococcus lugdunensis NOT DETECTED NOT DETECTED   Streptococcus species NOT DETECTED NOT DETECTED   Streptococcus agalactiae NOT DETECTED NOT DETECTED   Streptococcus pneumoniae NOT DETECTED NOT DETECTED   Streptococcus pyogenes NOT DETECTED NOT DETECTED   A.calcoaceticus-baumannii NOT DETECTED NOT DETECTED   Bacteroides fragilis NOT DETECTED NOT DETECTED   Enterobacterales DETECTED (A) NOT DETECTED   Enterobacter cloacae complex NOT DETECTED NOT DETECTED   Escherichia coli NOT DETECTED NOT DETECTED   Klebsiella aerogenes NOT DETECTED NOT DETECTED   Klebsiella oxytoca NOT DETECTED NOT DETECTED   Klebsiella pneumoniae NOT DETECTED NOT DETECTED   Proteus species DETECTED (A) NOT DETECTED   Salmonella species NOT DETECTED NOT DETECTED   Serratia marcescens NOT DETECTED NOT DETECTED   Haemophilus influenzae NOT DETECTED NOT DETECTED   Neisseria meningitidis NOT  DETECTED NOT DETECTED   Pseudomonas aeruginosa NOT DETECTED NOT DETECTED   Stenotrophomonas maltophilia NOT DETECTED NOT DETECTED   Candida albicans NOT DETECTED NOT DETECTED   Candida auris NOT DETECTED NOT DETECTED   Candida glabrata NOT DETECTED NOT DETECTED   Candida krusei NOT DETECTED NOT DETECTED   Candida parapsilosis NOT DETECTED NOT DETECTED   Candida tropicalis NOT DETECTED NOT DETECTED   Cryptococcus neoformans/gattii NOT DETECTED NOT DETECTED   CTX-M ESBL NOT DETECTED NOT DETECTED   Carbapenem resistance IMP NOT DETECTED NOT DETECTED   Carbapenem resistance KPC NOT DETECTED NOT DETECTED   Carbapenem resistance NDM NOT DETECTED NOT DETECTED   Carbapenem resist OXA 48 LIKE NOT DETECTED NOT DETECTED   Carbapenem resistance VIM NOT DETECTED NOT DETECTED    Jorel Gravlin D 10/19/2021  1:07 AM

## 2021-10-19 NOTE — ED Notes (Signed)
Pt given meal tray. Family at bedside.

## 2021-10-19 NOTE — ED Notes (Signed)
Pt changed into gown and placed in hospital bed for comfort. Pt complaining of back pain.

## 2021-10-19 NOTE — Consult Note (Addendum)
[facility]  CONSULT NOTE    Date: 10/19/2021                  Patient Name:  Nancy Blackburn  MRN: [id number]  DOB: [birth year]  Age / Sex: 75 y.o., female         PCP: Jerrol Banana., MD                 Service Requesting Consult: [facility]                 Reason for Consult: Acute Kidney Injury            History of Present Illness: Nancy Blackburn is a 75 y.o.  female with past medical conditions including hypertension, hyperlipidemia, anxiety and depression, pulmonary hypertension, and asthma, who was admitted to [facility] on 10/18/2021 for Severe sepsis with acute organ dysfunction ([place]) [A41.9, R65.20]  Patient seen sitting up in stretcher, with daughter-in-law at bedside.  Patient appears self-aware of her altered mental status and defers questioning to daughter-in-law.  Daughter-in-law states patient lives with a grandson.  Patient was last seen before bed and when grandson went to check on her the next morning, patient was found lying in the bathroom floor.  It is unknown how long patient remained on the floor.  According to grandson, patient has not been feeling well during the weekend.  Reports weakness and fatigue, unable to attend church.  Daughter-in-law states Saturday she was able to drive herself to [facility] to visit family.  Daughter-in-law states patient is mostly independent at home prior to admission, intermittent episodes of depression.  Patient denies nausea, vomiting, diarrhea.  Denies shortness of breath and fever.  Lab work on arrival include sodium 134, potassium 3.4, BUN 48, creatinine 2.18, GFR 23, hemoglobin 14.3, and WBCs 12.9.  Troponins elevated at 292 with BNP 229.4.  Head CT negative for acute changes.  Renal ultrasound shows 7 mm left renal calculus, no obstruction.   Medications: Outpatient medications: (Not in a hospital admission)   Current medications: Current Facility-Administered Medications  Medication Dose  Route Frequency Provider Last Rate Last Admin   acetaminophen (TYLENOL) tablet 650 mg  650 mg Oral Q6H PRN Cox, Amy N, DO   650 mg at 10/19/21 1319   Or   acetaminophen (TYLENOL) suppository 650 mg  650 mg Rectal Q6H PRN Cox, Amy N, DO       albuterol (PROVENTIL) (2.5 MG/3ML) 0.083% nebulizer solution 3 mL  3 mL Inhalation Q6H PRN Cox, Amy N, DO       Budeson-Glycopyrrol-Formoterol 160-9-4.8 MCG/ACT AERO 160 mcg  160 mcg Inhalation BID Wieting, Richard, MD       busPIRone (BUSPAR) tablet 10 mg  10 mg Oral BID Loletha Grayer, MD   10 mg at 10/19/21 1109   cefTRIAXone (ROCEPHIN) 2 g in sodium chloride 0.9 % 100 mL IVPB  2 g Intravenous Q24H Rise Patience, MD   Stopped at 10/19/21 1148   cholecalciferol (VITAMIN D3) tablet 1,000 Units  1,000 Units Oral Daily Loletha Grayer, MD   1,000 Units at 10/19/21 1110   citalopram (CELEXA) tablet 40 mg  40 mg Oral Daily Wieting, Richard, MD   40 mg at 10/19/21 1110   heparin injection 5,000 Units  5,000 Units Subcutaneous Q8H Cox, Amy N, DO   5,000 Units at 10/19/21 1319   lactated ringers infusion   Intravenous Continuous Loletha Grayer, MD 75 mL/hr at 10/19/21 1321 New  Bag at 10/19/21 1321   loratadine (CLARITIN) tablet 10 mg  10 mg Oral Daily Loletha Grayer, MD   10 mg at 10/19/21 1110   LORazepam (ATIVAN) tablet 0.5 mg  0.5 mg Oral TID PRN Loletha Grayer, MD   0.5 mg at 10/19/21 1407   methylPREDNISolone acetate (DEPO-MEDROL) injection 40 mg  40 mg Intramuscular Once Jerrol Banana., MD       ondansetron [facility]) tablet 4 mg  4 mg Oral Q6H PRN Cox, Amy N, DO       Or   ondansetron (ZOFRAN) injection 4 mg  4 mg Intravenous Q6H PRN Cox, Amy N, DO       traZODone (DESYREL) tablet 100 mg  100 mg Oral QHS Wieting, Richard, MD       vitamin B-12 (CYANOCOBALAMIN) tablet 1,000 mcg  1,000 mcg Oral Daily Loletha Grayer, MD   1,000 mcg at 10/19/21 1110   Current Outpatient Medications  Medication Sig Dispense Refill   amLODipine (NORVASC)  5 MG tablet Take 1 tablet (5 mg total) by mouth daily. 90 tablet 3   Budeson-Glycopyrrol-Formoterol (BREZTRI AEROSPHERE) 160-9-4.8 MCG/ACT AERO Inhale 160 mcg into the lungs in the morning and at bedtime. 5.9 g 0   busPIRone (BUSPAR) 10 MG tablet Take 10 mg by mouth 2 (two) times daily.      Calcium Carb-Cholecalciferol (CALCIUM 600+D3) 600-800 MG-UNIT TABS Take 1 tablet by mouth daily.     citalopram (CELEXA) 40 MG tablet Take 40 mg by mouth daily.     fexofenadine (ALLEGRA) 180 MG tablet Take 180 mg by mouth daily.     LORazepam (ATIVAN) 0.5 MG tablet Take 1 tablet (0.5 mg total) by mouth 3 (three) times daily as needed for anxiety. 90 tablet 5   rosuvastatin (CRESTOR) 10 MG tablet TAKE 1 TABLET EVERY DAY 90 tablet 2   traZODone (DESYREL) 100 MG tablet Take 100 mg by mouth at bedtime.     vitamin B-12 (CYANOCOBALAMIN) 1000 MCG tablet Take 1,000 mcg by mouth daily.     acetaminophen (TYLENOL) 325 MG tablet Take 1-2 tablets (325-650 mg total) by mouth every 6 (six) hours as needed for mild pain (pain score 1-3 or temp > 100.5). 60 tablet 1   albuterol (VENTOLIN HFA) 108 (90 Base) MCG/ACT inhaler Inhale 1-2 puffs into the lungs every 6 (six) hours as needed for wheezing or shortness of breath. 8 g 2      Allergies: Allergies  Allergen Reactions   Anti-Inflammatory Enzyme [Nutritional Supplements] Other (See Comments)    REACTION: Due to bleeding ulcer   Cymbalta [Duloxetine Hcl] Other (See Comments)    REACTION: Urinary retention   Clarithromycin Other (See Comments)    Upsets stomach   Donepezil Hcl Other (See Comments)    Significant bradycardia causing syncope   Nsaids     GI BLEED   Ranitidine     severe diarrhea   Venlafaxine     severe depression      Past Medical History: Past Medical History:  Diagnosis Date   Asthma    Bradycardia    Cervical cancer (HCC) 1978   Chronic sinus infection    Ejection fraction    a. EF 55-65%, echo, mild LVH, June, 2013, small  pericardial effusion   Fracture, sacrum/coccyx ([place])    History of kidney stones    Hyperlipidemia    a. takes Simvastatin   Hypertension    a. takes Metoprolol, Lisinopril, and amlodipine.   Low back pain  Mild dementia    Nasal polyps    Normal nuclear stress test    a. 07/2005 Nl nuc stress test.   Osteopenia    Osteoporosis    Palpitations    a. June 2013:  Event recorder normal sinus rhythm with rare PVCs - ? symptomatic PVC's.   Parkinson disease ([place])    Pericardial effusion    a. Small, echo, June, 2013   PONV (postoperative nausea and vomiting)      Past Surgical History: Past Surgical History:  Procedure Laterality Date   BACK SURGERY  08/2010   BLADDER SUSPENSION  2003   bone spur  2009   removed from right collar bone area and partial collar bone removed   CATARACT EXTRACTION W/ INTRAOCULAR LENS  IMPLANT, BILATERAL     EYE SURGERY     LUMBAR FUSION  2011, 2012   SHOULDER ARTHROSCOPY Right 2012   THYROIDECTOMY, PARTIAL  1976   TONSILLECTOMY     as child   TOTAL KNEE ARTHROPLASTY Right 03/09/2021   Procedure: Right TOTAL KNEE ARTHROPLASTY;  Surgeon: Thornton Park, MD;  Location: [facility] ORS;  Service: Orthopedics;  Laterality: Right;   TUBAL LIGATION  1977   VAGINAL HYSTERECTOMY  1978   d/t cervical cancer     Family History: Family History  Problem Relation Age of Onset   Colon cancer Mother    Heart murmur Mother    Heart disease Mother    Angina Mother    Heart disease Father    COPD Father    Atrial fibrillation Brother    Cushing syndrome Daughter    Cancer Daughter        thyroid   Breast cancer Cousin    Hypotension Neg Hx    Malignant hyperthermia Neg Hx    Pseudochol deficiency Neg Hx      Social History: Social History   Socioeconomic History   Marital status: Divorced    Spouse name: Not on file   Number of children: 2   Years of education: Not on file   Highest education level: 12th grade  Occupational History    Occupation: retired  Tobacco Use   Smoking status: Never   Smokeless tobacco: Never  Vaping Use   Vaping Use: Never used  Substance and Sexual Activity   Alcohol use: Yes    Alcohol/week: 0.0 standard drinks    Comment: rarely   Drug use: No   Sexual activity: Never  Other Topics Concern   Not on file  Social History Narrative   Not on file   Social Determinants of Health   Financial Resource Strain: Not on file  Food Insecurity: Not on file  Transportation Needs: Not on file  Physical Activity: Not on file  Stress: Not on file  Social Connections: Not on file  Intimate Partner Violence: Not on file     Review of Systems: Review of Systems  Constitutional:  Negative for chills, fever and malaise/fatigue.  HENT:  Negative for sore throat.   Eyes:  Negative for blurred vision and redness.  Respiratory:  Negative for cough, shortness of breath and wheezing.   Cardiovascular:  Negative for chest pain, palpitations, claudication and leg swelling.  Gastrointestinal:  Positive for abdominal pain, diarrhea, nausea and vomiting. Negative for blood in stool.  Genitourinary:  Negative for flank pain, frequency and hematuria.  Musculoskeletal:  Positive for falls. Negative for back pain and myalgias.  Skin:  Negative for rash.  Neurological:  Negative for dizziness,  weakness and headaches.  Endo/Heme/Allergies:  Does not bruise/bleed easily.  Psychiatric/Behavioral:  Negative for depression. The patient is not nervous/anxious and does not have insomnia.    Vital Signs: Blood pressure 130/80, pulse 73, temperature 98.1 F (36.7 C), temperature source Oral, resp. rate (!) 23, height 5\' 7"  (1.702 m), weight 72.6 kg, SpO2 96 %.  Weight trends: Filed Weights   10/18/21 0853  Weight: 72.6 kg    Physical Exam: General: NAD, resting in bed  Head: Normocephalic, atraumatic. Moist oral mucosal membranes  Eyes: Anicteric  Lungs:  Clear to auscultation, normal effort  Heart:  Regular rate and rhythm  Abdomen:  Soft, nontender  Extremities:  no peripheral edema.  Neurologic: Alert, moving all four extremities  Skin: No lesions        Lab results: Basic Metabolic Panel: Recent Labs  Lab 10/18/21 0903 10/18/21 1256 10/19/21 0439  NA 134*  --  134*  K 3.4*  --  3.6  CL 101  --  103  CO2 21*  --  20*  GLUCOSE 171*  --  100*  BUN 48*  --  57*  CREATININE 2.18*  --  2.96*  CALCIUM 8.3*  --  7.6*  MG  --  1.9  --   PHOS  --  3.9  --     Liver Function Tests: Recent Labs  Lab 10/18/21 0903 10/19/21 0439  AST 636* 380*  ALT 162* 135*  ALKPHOS 80 69  BILITOT 0.9 0.8  PROT 6.1* 5.5*  ALBUMIN 3.1* 2.4*   No results for input(s): LIPASE, AMYLASE in the last 168 hours. Recent Labs  Lab 10/18/21 1557  AMMONIA 16    CBC: Recent Labs  Lab 10/18/21 0903 10/19/21 0439  WBC 12.9* 9.9  NEUTROABS  --  7.9*  HGB 14.3 12.5  HCT 42.1 38.4  MCV 88.3 91.6  PLT 131* 106*    Cardiac Enzymes: Recent Labs  Lab 10/18/21 0903 10/18/21 1557 10/19/21 0439  CKTOTAL 14,929* 15,867* 10,710*    BNP: Invalid input(s): POCBNP  CBG: No results for input(s): GLUCAP in the last 168 hours.  Microbiology: Results for orders placed or performed during the hospital encounter of 10/18/21  Culture, blood (routine x 2)     Status: None (Preliminary result)   Collection Time: 10/18/21  9:37 AM   Specimen: BLOOD  Result Value Ref Range Status   Specimen Description BLOOD  RFOA  Final   Special Requests BOTTLES DRAWN AEROBIC AND ANAEROBIC  BCAV  Final   Culture  Setup Time   Final    GRAM NEGATIVE RODS ANAEROBIC BOTTLE ONLY CRITICAL VALUE NOTED.  VALUE IS CONSISTENT WITH PREVIOUSLY REPORTED AND CALLED VALUE. Performed at [facility], [address] [place], [place] [address]    Culture GRAM NEGATIVE RODS  Final   Report Status PENDING  Incomplete  Culture, blood (routine x 2)     Status: None (Preliminary result)   Collection Time:  10/18/21  9:37 AM   Specimen: BLOOD  Result Value Ref Range Status   Specimen Description   Final    BLOOD  LAC Performed at [facility], [address], [place], [place] [address]    Special Requests   Final    BOTTLES DRAWN AEROBIC AND ANAEROBIC Blood Culture adequate volume Performed at [facility] [address], [place], [place] [address]    Culture  Setup Time   Final    Organism ID to follow GRAM NEGATIVE  RODS IN BOTH AEROBIC AND ANAEROBIC BOTTLES CRITICAL RESULT CALLED TO, READ BACK BY AND VERIFIED WITH: Lillia Abed @0031  ON 10/19/21 SKL Performed at [facility], [address] [place], [place] [address]    Culture GRAM NEGATIVE RODS  Final   Report Status PENDING  Incomplete  Blood Culture ID Panel (Reflexed)     Status: Abnormal   Collection Time: 10/18/21  9:37 AM  Result Value Ref Range Status   Enterococcus faecalis NOT DETECTED NOT DETECTED Final   Enterococcus Faecium NOT DETECTED NOT DETECTED Final   Listeria monocytogenes NOT DETECTED NOT DETECTED Final   Staphylococcus species NOT DETECTED NOT DETECTED Final   Staphylococcus aureus (BCID) NOT DETECTED NOT DETECTED Final   Staphylococcus epidermidis NOT DETECTED NOT DETECTED Final   Staphylococcus lugdunensis NOT DETECTED NOT DETECTED Final   Streptococcus species NOT DETECTED NOT DETECTED Final   Streptococcus agalactiae NOT DETECTED NOT DETECTED Final   Streptococcus pneumoniae NOT DETECTED NOT DETECTED Final   Streptococcus pyogenes NOT DETECTED NOT DETECTED Final   A.calcoaceticus-baumannii NOT DETECTED NOT DETECTED Final   Bacteroides fragilis NOT DETECTED NOT DETECTED Final   Enterobacterales DETECTED (A) NOT DETECTED Final    Comment: Enterobacterales represent a large order of gram negative bacteria, not a single organism. CRITICAL RESULT CALLED TO, READ BACK BY AND VERIFIED WITH: Lillia Abed @0031  ON 10/19/21 SKL    Enterobacter cloacae complex NOT DETECTED NOT  DETECTED Final   Escherichia coli NOT DETECTED NOT DETECTED Final   Klebsiella aerogenes NOT DETECTED NOT DETECTED Final   Klebsiella oxytoca NOT DETECTED NOT DETECTED Final   Klebsiella pneumoniae NOT DETECTED NOT DETECTED Final   Proteus species DETECTED (A) NOT DETECTED Final    Comment: CRITICAL RESULT CALLED TO, READ BACK BY AND VERIFIED WITH: Lillia Abed @0031  ON 10/19/21 SKL    Salmonella species NOT DETECTED NOT DETECTED Final   Serratia marcescens NOT DETECTED NOT DETECTED Final   Haemophilus influenzae NOT DETECTED NOT DETECTED Final   Neisseria meningitidis NOT DETECTED NOT DETECTED Final   Pseudomonas aeruginosa NOT DETECTED NOT DETECTED Final   Stenotrophomonas maltophilia NOT DETECTED NOT DETECTED Final   Candida albicans NOT DETECTED NOT DETECTED Final   Candida auris NOT DETECTED NOT DETECTED Final   Candida glabrata NOT DETECTED NOT DETECTED Final   Candida krusei NOT DETECTED NOT DETECTED Final   Candida parapsilosis NOT DETECTED NOT DETECTED Final   Candida tropicalis NOT DETECTED NOT DETECTED Final   Cryptococcus neoformans/gattii NOT DETECTED NOT DETECTED Final   CTX-M ESBL NOT DETECTED NOT DETECTED Final   Carbapenem resistance IMP NOT DETECTED NOT DETECTED Final   Carbapenem resistance KPC NOT DETECTED NOT DETECTED Final   Carbapenem resistance NDM NOT DETECTED NOT DETECTED Final   Carbapenem resist OXA 48 LIKE NOT DETECTED NOT DETECTED Final   Carbapenem resistance VIM NOT DETECTED NOT DETECTED Final    Comment: Performed at [facility], [address] [place], [place] [address]  Resp Panel by RT-PCR (Flu A&B, Covid) Nasopharyngeal Swab     Status: None   Collection Time: 10/18/21 11:15 AM   Specimen: Nasopharyngeal Swab; Nasopharyngeal(NP) swabs in vial transport medium  Result Value Ref Range Status   SARS Coronavirus 2 by RT PCR NEGATIVE NEGATIVE Final    Comment: (NOTE) SARS-CoV-2 target nucleic acids are NOT DETECTED.  The SARS-CoV-2 RNA  is generally detectable in upper respiratory specimens during the acute phase of infection. The lowest concentration of SARS-CoV-2 viral copies this assay can detect is  138 copies/mL. A negative result does not preclude SARS-Cov-2 infection and should not be used as the sole basis for treatment or other patient management decisions. A negative result may occur with  improper specimen collection/handling, submission of specimen other than nasopharyngeal swab, presence of viral mutation(s) within the areas targeted by this assay, and inadequate number of viral copies(<138 copies/mL). A negative result must be combined with clinical observations, patient history, and epidemiological information. The expected result is Negative.  Fact Sheet for Patients:  EntrepreneurPulse.com.au  Fact Sheet for Healthcare Providers:  IncredibleEmployment.be  This test is no t yet approved or cleared by the [place] FDA and  has been authorized for detection and/or diagnosis of SARS-CoV-2 by FDA under an Emergency Use Authorization (EUA). This EUA will remain  in effect (meaning this test can be used) for the duration of the COVID-19 declaration under Section 564(b)(1) of the Act, 21 U.S.C.section 360bbb-3(b)(1), unless the authorization is terminated  or revoked sooner.       Influenza A by PCR NEGATIVE NEGATIVE Final   Influenza B by PCR NEGATIVE NEGATIVE Final    Comment: (NOTE) The Xpert Xpress SARS-CoV-2/FLU/RSV plus assay is intended as an aid in the diagnosis of influenza from Nasopharyngeal swab specimens and should not be used as a sole basis for treatment. Nasal washings and aspirates are unacceptable for Xpert Xpress SARS-CoV-2/FLU/RSV testing.  Fact Sheet for Patients: EntrepreneurPulse.com.au  Fact Sheet for Healthcare Providers: IncredibleEmployment.be  This test is not yet approved or cleared by the  [place] FDA and has been authorized for detection and/or diagnosis of SARS-CoV-2 by FDA under an Emergency Use Authorization (EUA). This EUA will remain in effect (meaning this test can be used) for the duration of the COVID-19 declaration under Section 564(b)(1) of the Act, 21 U.S.C. section 360bbb-3(b)(1), unless the authorization is terminated or revoked.  Performed at [facility], [address] [place], [place] [address]   MRSA Next Gen by PCR, Nasal     Status: None   Collection Time: 10/18/21 12:56 PM   Specimen: Nasal Mucosa; Nasal Swab  Result Value Ref Range Status   MRSA by PCR Next Gen NOT DETECTED NOT DETECTED Final    Comment: (NOTE) The GeneXpert MRSA Assay (FDA approved for NASAL specimens only), is one component of a comprehensive MRSA colonization surveillance program. It is not intended to diagnose MRSA infection nor to guide or monitor treatment for MRSA infections. Test performance is not FDA approved in patients less than 6 years old. Performed at [facility], [address] [place], [place] [address]     Coagulation Studies: Recent Labs    10/19/21 0439  LABPROT 12.9  INR 1.0    Urinalysis: Recent Labs    10/19/21 1113  COLORURINE AMBER*  LABSPEC 1.015  PHURINE 5.0  GLUCOSEU 50*  HGBUR LARGE*  BILIRUBINUR NEGATIVE  KETONESUR NEGATIVE  PROTEINUR 100*  NITRITE NEGATIVE  LEUKOCYTESUR LARGE*      Imaging: CT Abdomen Pelvis Wo Contrast  Result Date: 10/18/2021 CLINICAL DATA:  75 year old female with abdominal pain. Confusion since yesterday. Found down this morning. Diaphoretic. EXAM: CT ABDOMEN AND PELVIS WITHOUT CONTRAST TECHNIQUE: Multidetector CT imaging of the abdomen and pelvis was performed following the standard protocol without IV contrast. COMPARISON:  Portable chest radiograph this morning. CTA chest 04/04/2021. CT lumbar spine 10/04/2011. Lumbar MRI 12/17/2017 FINDINGS: Lower chest: Mild lung base  scarring and/or atelectasis has not significantly changed since April. Chronic elevated right hemidiaphragm. No pericardial or  pleural effusion. Small right side Bochdalek's hernia. Hepatobiliary: Negative noncontrast liver and gallbladder. Pancreas: Negative. Spleen: Negative. Adrenals/Urinary Tract: Normal adrenal glands. Right kidney and ureter appear nonobstructed. Right ureter is decompressed and negative. Urinary bladder is fully decompressed. But the left kidney appears inflamed, with mild to moderate pararenal space inflammatory stranding. There is lower pole nephrolithiasis or nephrocalcinosis. Mild if any associated hydronephrosis, but there is mild asymmetric hydroureter and periureteral stranding. However, no ureteral calculus is identified. Stomach/Bowel: Mild diverticulosis of the large bowel at the distal descending and sigmoid. No active inflammation. Redundant transverse colon with mild retained stool. Negative right colon. Normal retrocecal appendix on series 2, image 61. Decompressed and negative terminal ileum. No dilated small bowel. Small fat containing umbilical hernia. Negative stomach. Duodenal diverticulum at the 2nd portion but no active inflammation. No free air or free fluid. Vascular/Lymphatic: Aortoiliac calcified atherosclerosis. Normal caliber abdominal aorta. No lymphadenopathy. Reproductive: Uterus appears to be surgically absent. No normal ovarian parenchyma identified, but there is a large, circumscribed, round 8.5 cm diameter cystic mass in the left hemipelvis with simple fluid density. This is in the region of the left adnexa. Other: No pelvic free fluid. Musculoskeletal: Hypoplastic ribs at T12. Chronic L4-L5 spondylolisthesis, decompression and fusion. Mild to moderate superior L3 compression fracture is new since 2019. Mild retropulsion of the posterosuperior endplate but no significant stenosis suspected. No other No acute osseous abnormality identified. No superficial soft  tissue injury identified. IMPRESSION: 1. Asymmetrically inflamed left kidney. But mild if any associated left hydronephrosis and hydroureter, and no obstructing calculus or lesion identified. Consider Left Pyelonephritis and/or Ascending UTI. CT Abdomen with IV contrast would best evaluate for pyelonephritis. 2. Large 8.5 cm cystic mass in the left hemipelvis appears to be ovarian in origin. Simple-appearing cyst, but not adequately characterized on this noncontrast CT. Recommend prompt follow-up with Pelvic US. Reference: JACR 2020 Feb;17(2):248-254 3. Mild to moderate L3 compression fracture is new since 2019 but age indeterminate. If specific therapy such as vertebroplasty is desired, Lumbar MRI without contrast or Nuclear Medicine Whole-body Bone Scan would best determine acuity. 4. No other acute traumatic injury identified in the noncontrast abdomen or pelvis. Aortic Atherosclerosis (ICD10-I70.0). Electronically Signed   By: Genevie Ann M.D.   On: 10/18/2021 11:50   DG Knee 2 Views Right  Result Date: 10/18/2021 CLINICAL DATA:  [place] this morning. EXAM: RIGHT KNEE - 1-2 VIEW COMPARISON:  03/09/2021 FINDINGS: Stable right total knee prosthesis. No fracture, dislocation or effusion seen. IMPRESSION: No acute abnormality. Electronically Signed   By: Claudie Revering M.D.   On: 10/18/2021 09:42   CT HEAD WO CONTRAST (5MM)  Result Date: 10/18/2021 CLINICAL DATA:  Found on floor, possible fall EXAM: CT HEAD WITHOUT CONTRAST CT CERVICAL SPINE WITHOUT CONTRAST TECHNIQUE: Multidetector CT imaging of the head and cervical spine was performed following the standard protocol without intravenous contrast. Multiplanar CT image reconstructions of the cervical spine were also generated. COMPARISON:  None. FINDINGS: CT HEAD FINDINGS Brain: No evidence of acute infarction, hemorrhage, hydrocephalus, extra-axial collection or mass lesion/mass effect. Mild periventricular and deep white matter hypodensity. Vascular: No hyperdense  vessel or unexpected calcification. Skull: Normal. Negative for fracture or focal lesion. Sinuses/Orbits: Partial opacification of the ethmoid air cells and sphenoid sinuses. Mucoid retention cyst of the left maxillary sinus. Other: None. CT CERVICAL SPINE FINDINGS Alignment: Degenerative straightening and reversal of the normal cervical lordosis. Skull base and vertebrae: No acute fracture. No primary bone lesion or focal pathologic process. Soft tissues  and spinal canal: No prevertebral fluid or swelling. No visible canal hematoma. Disc levels: Focally moderate disc space height loss and osteophytosis of C5 through C7. Upper chest: Negative. Other: None. IMPRESSION: 1. No acute intracranial pathology. Small-vessel white matter disease. 2. No fracture or static subluxation of the cervical spine. 3. Focally moderate disc space height loss and osteophytosis of C5 through C7. Electronically Signed   By: Delanna Ahmadi M.D.   On: 10/18/2021 10:41   CT Cervical Spine Wo Contrast  Result Date: 10/18/2021 CLINICAL DATA:  Found on floor, possible fall EXAM: CT HEAD WITHOUT CONTRAST CT CERVICAL SPINE WITHOUT CONTRAST TECHNIQUE: Multidetector CT imaging of the head and cervical spine was performed following the standard protocol without intravenous contrast. Multiplanar CT image reconstructions of the cervical spine were also generated. COMPARISON:  None. FINDINGS: CT HEAD FINDINGS Brain: No evidence of acute infarction, hemorrhage, hydrocephalus, extra-axial collection or mass lesion/mass effect. Mild periventricular and deep white matter hypodensity. Vascular: No hyperdense vessel or unexpected calcification. Skull: Normal. Negative for fracture or focal lesion. Sinuses/Orbits: Partial opacification of the ethmoid air cells and sphenoid sinuses. Mucoid retention cyst of the left maxillary sinus. Other: None. CT CERVICAL SPINE FINDINGS Alignment: Degenerative straightening and reversal of the normal cervical lordosis.  Skull base and vertebrae: No acute fracture. No primary bone lesion or focal pathologic process. Soft tissues and spinal canal: No prevertebral fluid or swelling. No visible canal hematoma. Disc levels: Focally moderate disc space height loss and osteophytosis of C5 through C7. Upper chest: Negative. Other: None. IMPRESSION: 1. No acute intracranial pathology. Small-vessel white matter disease. 2. No fracture or static subluxation of the cervical spine. 3. Focally moderate disc space height loss and osteophytosis of C5 through C7. Electronically Signed   By: Delanna Ahmadi M.D.   On: 10/18/2021 10:41   US RENAL  Result Date: 10/19/2021 CLINICAL DATA:  Acute kidney injury. EXAM: RENAL / URINARY TRACT ULTRASOUND COMPLETE COMPARISON:  CT abdomen and pelvis 10/18/2021 FINDINGS: Right Kidney: Renal measurements: 12.1 x 5.9 x 5.8 cm = volume: 219 mL. Echogenicity within normal limits. No mass or hydronephrosis visualized. Left Kidney: Renal measurements: 11.7 x 5.7 x 5.4 cm = volume: 188 mL. Echogenicity within normal limits. No mass or hydronephrosis visualized. 7 mm shadowing stone in the lower pole. Bladder: Appears normal for degree of bladder distention. Other: None. IMPRESSION: 7 mm left renal calculus. No hydronephrosis. Electronically Signed   By: Logan Bores M.D.   On: 10/19/2021 13:45   DG Chest Portable 1 View  Result Date: 10/18/2021 CLINICAL DATA:  Shortness of breath. EXAM: PORTABLE CHEST 1 VIEW COMPARISON:  07/29/2021 FINDINGS: Normal sized heart. Clear lungs. Progressive right diaphragmatic eventration. Unremarkable bones. IMPRESSION: No active disease. Electronically Signed   By: Claudie Revering M.D.   On: 10/18/2021 09:40     Assessment & Plan: Nancy Blackburn is a 75 y.o.  female with past medical conditions including hypertension, hyperlipidemia, anxiety and depression, pulmonary hypertension, and asthma, who was admitted to [facility] on 10/18/2021 for Severe sepsis with acute organ  dysfunction ([place]) [A41.9, R65.20]   Acute Kidney Injury with hematuria and proteinuria. Baseline creatinine 0.84 on 07/29/21.  Acute kidney injury secondary to sepsis and rhabdomyolysis Renal ultrasound negative for obstruction, but positive for a 7 mm left renal calculus.  No IV contrast exposure.  No indication for dialysis at this time.  Suggest continuing with IV fluids for hydration. Will order SPEP, UPEP, ANCA, ANA and Complements.  We will continue  to monitor renal indices and urine output.Avoid nephrotoxic agents and therapies if possible.   2. Hypertension Home regimen includes Amlodipine.  All medications held at this time.  BP currently 149/82  3. Hyponatremia likely due to kidney injury. May improve with treatment of underlying cause.   4. Severe Sepsis with hypotension and acute metabolic encephalopathy. Blood cultures growing proteus. Rocephin prescribed at this time.       LOS: 1   11/8/20222:14 PM

## 2021-10-20 DIAGNOSIS — R7989 Other specified abnormal findings of blood chemistry: Secondary | ICD-10-CM

## 2021-10-20 DIAGNOSIS — M6282 Rhabdomyolysis: Secondary | ICD-10-CM | POA: Diagnosis not present

## 2021-10-20 DIAGNOSIS — N179 Acute kidney failure, unspecified: Secondary | ICD-10-CM | POA: Diagnosis not present

## 2021-10-20 DIAGNOSIS — A4159 Other Gram-negative sepsis: Secondary | ICD-10-CM | POA: Diagnosis not present

## 2021-10-20 DIAGNOSIS — G9341 Metabolic encephalopathy: Secondary | ICD-10-CM | POA: Diagnosis not present

## 2021-10-20 DIAGNOSIS — R531 Weakness: Secondary | ICD-10-CM

## 2021-10-20 DIAGNOSIS — M25561 Pain in right knee: Secondary | ICD-10-CM

## 2021-10-20 LAB — URINE CULTURE
Culture: NO GROWTH
Special Requests: NORMAL

## 2021-10-20 LAB — BASIC METABOLIC PANEL
Anion gap: 10 (ref 5–15)
BUN: 67 mg/dL — ABNORMAL HIGH (ref 8–23)
CO2: 20 mmol/L — ABNORMAL LOW (ref 22–32)
Calcium: 8.1 mg/dL — ABNORMAL LOW (ref 8.9–10.3)
Chloride: 103 mmol/L (ref 98–111)
Creatinine, Ser: 3.78 mg/dL — ABNORMAL HIGH (ref 0.44–1.00)
GFR, Estimated: 12 mL/min — ABNORMAL LOW (ref >60)
Glucose, Bld: 135 mg/dL — ABNORMAL HIGH (ref 70–99)
Potassium: 4.2 mmol/L (ref 3.5–5.1)
Sodium: 133 mmol/L — ABNORMAL LOW (ref 135–145)

## 2021-10-20 LAB — ANCA PROFILE
Anti-MPO Antibodies: <0.2 units (ref 0.0–0.9)
Anti-PR3 Antibodies: <0.2 units (ref 0.0–0.9)
Atypical P-ANCA titer: <1:20 {titer}
C-ANCA: <1:20 {titer}
P-ANCA: <1:20 {titer}

## 2021-10-20 LAB — CBC
HCT: 34.2 % — ABNORMAL LOW (ref 36.0–46.0)
Hemoglobin: 11.6 g/dL — ABNORMAL LOW (ref 12.0–15.0)
MCH: 30.3 pg (ref 26.0–34.0)
MCHC: 33.9 g/dL (ref 30.0–36.0)
MCV: 89.3 fL (ref 80.0–100.0)
Platelets: 98 10*3/uL — ABNORMAL LOW (ref 150–400)
RBC: 3.83 MIL/uL — ABNORMAL LOW (ref 3.87–5.11)
RDW: 13.3 % (ref 11.5–15.5)
WBC: 9.5 10*3/uL (ref 4.0–10.5)
nRBC: 0 % (ref 0.0–0.2)

## 2021-10-20 LAB — ANA W/REFLEX: Anti Nuclear Antibody (ANA): NEGATIVE

## 2021-10-20 LAB — C3 COMPLEMENT: C3 Complement: 99 mg/dL (ref 82–167)

## 2021-10-20 LAB — C4 COMPLEMENT: Complement C4, Body Fluid: 27 mg/dL (ref 12–38)

## 2021-10-20 LAB — CK: Total CK: 3332 U/L — ABNORMAL HIGH (ref 38–234)

## 2021-10-20 MED ORDER — UMECLIDINIUM BROMIDE 62.5 MCG/ACT IN AEPB
1.0000 | INHALATION_SPRAY | Freq: Every day | RESPIRATORY_TRACT | Status: DC
  Administered 2021-10-20 – 2021-10-26 (×7): 1 via RESPIRATORY_TRACT
  Filled 2021-10-20: qty 7

## 2021-10-20 MED ORDER — FLUTICASONE FUROATE-VILANTEROL 200-25 MCG/ACT IN AEPB
1.0000 | INHALATION_SPRAY | Freq: Every day | RESPIRATORY_TRACT | Status: DC
  Administered 2021-10-20 – 2021-10-26 (×7): 1 via RESPIRATORY_TRACT
  Filled 2021-10-20: qty 28

## 2021-10-20 MED ORDER — AMLODIPINE BESYLATE 5 MG PO TABS
2.5000 mg | ORAL_TABLET | Freq: Every day | ORAL | Status: DC
Start: 2021-10-20 — End: 2021-10-22
  Administered 2021-10-20 – 2021-10-21 (×2): 2.5 mg via ORAL
  Filled 2021-10-20 (×2): qty 1

## 2021-10-20 MED ORDER — LIDOCAINE 5 % EX PTCH
1.0000 | MEDICATED_PATCH | CUTANEOUS | Status: DC
  Administered 2021-10-20 – 2021-10-25 (×6): 1 via TRANSDERMAL
  Filled 2021-10-20 (×7): qty 1

## 2021-10-20 NOTE — Progress Notes (Signed)
Patient ID: Nancy Blackburn, female   DOB: 03/29/1946, 75 y.o.   MRN: [id number] [facility] PROGRESS NOTE  Nancy Blackburn BDZ:[id number] DOB: 07-31-1946 DOA: 10/18/2021 PCP: Jerrol Banana., MD  HPI/Subjective: Patient complaining of knee pain.  She has a history of knee replacement.  She is having some worsening knee pain.  Admitted with sepsis and rhabdomyolysis.  Objective: Vitals:   10/20/21 1028 10/20/21 1150  BP:  (!) 142/80  Pulse:  80  Resp:  18  Temp:  98.2 F (36.8 C)  SpO2: 98% 98%    Intake/Output Summary (Last 24 hours) at 10/20/2021 1253 Last data filed at 10/20/2021 1019 Gross per 24 hour  Intake 4542.7 ml  Output 300 ml  Net 4242.7 ml   Filed Weights   10/18/21 0853 10/19/21 1500  Weight: 72.6 kg 86.2 kg    ROS: Review of Systems  Respiratory:  Negative for shortness of breath.   Cardiovascular:  Negative for chest pain.  Gastrointestinal:  Negative for abdominal pain, nausea and vomiting.  Exam: Physical Exam HENT:     Head: Normocephalic.  Eyes:     General: Lids are normal.     Conjunctiva/sclera: Conjunctivae normal.  Cardiovascular:     Rate and Rhythm: Normal rate and regular rhythm.     Heart sounds: Normal heart sounds, S1 normal and S2 normal.  Pulmonary:     Breath sounds: Normal breath sounds. No decreased breath sounds, wheezing, rhonchi or rales.  Abdominal:     Palpations: Abdomen is soft.     Tenderness: There is no abdominal tenderness.  Musculoskeletal:     Right knee: Decreased range of motion. Tenderness present.     Right lower leg: No swelling.     Left lower leg: No swelling.  Skin:    General: Skin is warm.     Findings: No rash.  Neurological:     Mental Status: She is alert and oriented to person, place, and time.      Scheduled Meds:  amLODipine  2.5 mg Oral Daily   busPIRone  10 mg Oral BID   cholecalciferol  1,000 Units Oral Daily   citalopram  40 mg Oral Daily   fluticasone  furoate-vilanterol  1 puff Inhalation Daily   And   umeclidinium bromide  1 puff Inhalation Daily   heparin  5,000 Units Subcutaneous Q8H   lidocaine  1 patch Transdermal Q24H   loratadine  10 mg Oral Daily   traZODone  100 mg Oral QHS   vitamin B-12  1,000 mcg Oral Daily   Continuous Infusions:  cefTRIAXone (ROCEPHIN)  IV 2 g (10/20/21 1141)   lactated ringers 75 mL/hr at 10/20/21 0531    Assessment/Plan:  Severe sepsis, present on admission with acute kidney injury, hypotension acute metabolic encephalopathy.  Proteus growing out of blood cultures.  I suspect a urine source but urine culture not back yet.  Initially patient had tachypnea, tachycardia and leukocytosis.  No infectious disease coverage today will consult infectious disease tomorrow. Acute kidney injury.  Continue IV fluid hydration.  Creatinine worsening today to 3.78.  Case discussed with nephrology. Acute metabolic encephalopathy.  This has improved. Rhabdomyolysis.  Hold cholesterol medication.  Continue IV fluid hydration.  CPK trended down from 15,867 to 3332. Right knee pain with history of right knee replacement.  Could be from rhabdomyolysis but since the patient has sepsis, I asked orthopedic surgery to evaluate.  Had negative right knee x-ray on admission. Elevated liver function  test secondary to sepsis Hyperlipidemia unspecified.  Continue to hold Crestor at this time with rhabdomyolysis Elevated troponin secondary to rhabdomyolysis Asthma.  Continue inhaler Parkinson's disease Weakness.  Appreciate physical therapy consult     Code Status:     Code Status Orders  (From admission, onward)           Start     Ordered   10/18/21 1220  Full code  Continuous        10/18/21 1223           Code Status History     Date Active Date Inactive Code Status Order ID Comments User Context   03/09/2021 1232 03/12/2021 2121 Full Code [id number]  Thornton Park, MD Inpatient   07/01/2020 2328 07/03/2020  2053 Full Code [id number]  Mansy, Arvella Merles, MD ED      Advance Directive Documentation    Flowsheet Row Most Recent Value  Type of Advance Directive Healthcare Power of Attorney, Living will  Pre-existing out of facility DNR order (yellow form or pink MOST form) --  "MOST" Form in Place? --      Family Communication: Spoke with Family at the bedside Disposition Plan: Status is: Inpatient  Consultants: Nephrology Orthopedic surgery  Antibiotics: Rocephin  Time spent: 27 minutes, case discussed with orthopedics and nephrology  [place]

## 2021-10-20 NOTE — Consult Note (Signed)
ORTHOPAEDIC CONSULTATION  REQUESTING PHYSICIAN: Loletha Grayer, MD  Chief Complaint: Right knee pain  HPI: Nancy Blackburn is a 75 y.o. female who is s/p right TKA by me on 03/09/21 was admitted to the hospitalist service on 10/18/21 for urosepsis.  She is growing Proteus in her blood culture.   Patient was found down by her grandson.  It is not clear how long she was down.   Patient fell using going to the restroom.  Patient has complained on right knee pain after admission and orthopaedics is consulted to evaluate for possible septic knee.   Past Medical History:  Diagnosis Date   Asthma    Bradycardia    Cervical cancer ([place]) 1978   Chronic sinus infection    Ejection fraction    a. EF 55-65%, echo, mild LVH, June, 2013, small pericardial effusion   Fracture, sacrum/coccyx ([place])    History of kidney stones    Hyperlipidemia    a. takes Simvastatin   Hypertension    a. takes Metoprolol, Lisinopril, and amlodipine.   Low back pain    Mild dementia    Nasal polyps    Normal nuclear stress test    a. 07/2005 Nl nuc stress test.   Osteopenia    Osteoporosis    Palpitations    a. June 2013:  Event recorder normal sinus rhythm with rare PVCs - ? symptomatic PVC's.   Parkinson disease ([place])    Pericardial effusion    a. Small, echo, June, 2013   PONV (postoperative nausea and vomiting)    Past Surgical History:  Procedure Laterality Date   BACK SURGERY  08/2010   BLADDER SUSPENSION  2003   bone spur  2009   removed from right collar bone area and partial collar bone removed   CATARACT EXTRACTION W/ INTRAOCULAR LENS  IMPLANT, BILATERAL     EYE SURGERY     LUMBAR FUSION  2011, 2012   SHOULDER ARTHROSCOPY Right 2012   THYROIDECTOMY, PARTIAL  1976   TONSILLECTOMY     as child   TOTAL KNEE ARTHROPLASTY Right 03/09/2021   Procedure: Right TOTAL KNEE ARTHROPLASTY;  Surgeon: Thornton Park, MD;  Location: [facility] ORS;  Service: Orthopedics;  Laterality: Right;   TUBAL  LIGATION  1977   VAGINAL HYSTERECTOMY  1978   d/t cervical cancer   Social History   Socioeconomic History   Marital status: Divorced    Spouse name: Not on file   Number of children: 2   Years of education: Not on file   Highest education level: 12th grade  Occupational History   Occupation: retired  Tobacco Use   Smoking status: Never   Smokeless tobacco: Never  Vaping Use   Vaping Use: Never used  Substance and Sexual Activity   Alcohol use: Yes    Alcohol/week: 0.0 standard drinks    Comment: rarely   Drug use: No   Sexual activity: Never  Other Topics Concern   Not on file  Social History Narrative   Not on file   Social Determinants of Health   Financial Resource Strain: Not on file  Food Insecurity: Not on file  Transportation Needs: Not on file  Physical Activity: Not on file  Stress: Not on file  Social Connections: Not on file   Family History  Problem Relation Age of Onset   Colon cancer Mother    Heart murmur Mother    Heart disease Mother    Angina Mother    Heart  disease Father    COPD Father    Atrial fibrillation Brother    Cushing syndrome Daughter    Cancer Daughter        thyroid   Breast cancer Cousin    Hypotension Neg Hx    Malignant hyperthermia Neg Hx    Pseudochol deficiency Neg Hx    Allergies  Allergen Reactions   Anti-Inflammatory Enzyme [Nutritional Supplements] Other (See Comments)    REACTION: Due to bleeding ulcer   Cymbalta [Duloxetine Hcl] Other (See Comments)    REACTION: Urinary retention   Clarithromycin Other (See Comments)    Upsets stomach   Donepezil Hcl Other (See Comments)    Significant bradycardia causing syncope   Nsaids     GI BLEED   Ranitidine     severe diarrhea   Venlafaxine     severe depression   Prior to Admission medications   Medication Sig Start Date End Date Taking? Authorizing Provider  amLODipine (NORVASC) 5 MG tablet Take 1 tablet (5 mg total) by mouth daily. 05/27/21  Yes Jerrol Banana., MD  Budeson-Glycopyrrol-Formoterol (BREZTRI AEROSPHERE) 160-9-4.8 MCG/ACT AERO Inhale 160 mcg into the lungs in the morning and at bedtime. 09/30/21  Yes Tyler Pita, MD  busPIRone (BUSPAR) 10 MG tablet Take 10 mg by mouth 2 (two) times daily.  05/09/17  Yes [provider]  Calcium Carb-Cholecalciferol (CALCIUM 600+D3) 600-800 MG-UNIT TABS Take 1 tablet by mouth daily.   Yes [provider]  citalopram (CELEXA) 40 MG tablet Take 40 mg by mouth daily. 08/06/15  Yes [provider]  fexofenadine (ALLEGRA) 180 MG tablet Take 180 mg by mouth daily.   Yes [provider]  LORazepam (ATIVAN) 0.5 MG tablet Take 1 tablet (0.5 mg total) by mouth 3 (three) times daily as needed for anxiety. 04/15/21  Yes Jerrol Banana., MD  rosuvastatin (CRESTOR) 10 MG tablet TAKE 1 TABLET EVERY DAY 09/18/20  Yes Jerrol Banana., MD  traZODone (DESYREL) 100 MG tablet Take 100 mg by mouth at bedtime.   Yes [provider]  vitamin B-12 (CYANOCOBALAMIN) 1000 MCG tablet Take 1,000 mcg by mouth daily.   Yes [provider]  acetaminophen (TYLENOL) 325 MG tablet Take 1-2 tablets (325-650 mg total) by mouth every 6 (six) hours as needed for mild pain (pain score 1-3 or temp > 100.5). 03/11/21   Thornton Park, MD  albuterol (VENTOLIN HFA) 108 (90 Base) MCG/ACT inhaler Inhale 1-2 puffs into the lungs every 6 (six) hours as needed for wheezing or shortness of breath. 07/02/21   Parrett, Fonnie Mu, NP   US RENAL  Result Date: 10/19/2021 CLINICAL DATA:  Acute kidney injury. EXAM: RENAL / URINARY TRACT ULTRASOUND COMPLETE COMPARISON:  CT abdomen and pelvis 10/18/2021 FINDINGS: Right Kidney: Renal measurements: 12.1 x 5.9 x 5.8 cm = volume: 219 mL. Echogenicity within normal limits. No mass or hydronephrosis visualized. Left Kidney: Renal measurements: 11.7 x 5.7 x 5.4 cm = volume: 188 mL. Echogenicity within normal limits. No mass or hydronephrosis  visualized. 7 mm shadowing stone in the lower pole. Bladder: Appears normal for degree of bladder distention. Other: None. IMPRESSION: 7 mm left renal calculus. No hydronephrosis. Electronically Signed   By: Logan Bores M.D.   On: 10/19/2021 13:45    Positive ROS: All other systems have been reviewed and were otherwise negative with the exception of those mentioned in the HPI and as above.  Physical Exam: General: Alert, no acute distress.  Patient OOB to a chair with right leg elevated and an icepack over the anterior knee.    MUSCULOSKELETAL: RIGHT KNEE:  Her ROM is 0-90 with mild pain in full flexion but not in the midrange of motion.   The skin is intact.   There are two small areas of ecchymosis over the anterior knee with mild swelling, but no effusion.   There is no erythema and no instability.  She is NVI with no lower leg edema or calf tenderness.    Assessment: Right knee contusion  Plan: I do not see clinical evidence of a septic right knee joint today.  She has no erythema or effusion.  I do not believe a knee aspiration would yield any fluid today.  She has had slight improvement in her knee pain today to the point that she was able to get out of bed, use the restroom and sit up in a chair.  I recommend she continue PT.   I will continue to follow her during this hospitalization.   I discussed this case with Dr. Leslye Peer.  She will continue IV antibiotics for urosepsis as ordered and continue medical treatment for rhabdomyolysis and acute kidney failure.  I reviewed the xrays of her right knee and see no evidence of fracture or loosening of her right total knee prosthesis.     Thornton Park, MD    10/20/2021 3:20 PM

## 2021-10-20 NOTE — Evaluation (Signed)
Physical Therapy Evaluation Patient Details Name: INARA DIKE MRN: [id number] DOB: 10-20-1946 Today's Date: 10/20/2021  History of Present Illness  Patient is alert and awake.  She came in not feeling well.  Started on Sunday night.  Was found on Monday morning.  She was on the floor for an undetermined amount of time.  Admitted with sepsis, acute kidney injury and rhabdomyolysis. Patient with recent knee replacement and is having severe pain in right knee.   Clinical Impression  Patient received attempting to get bedside with OT in room. Patient is limited by severe pain in R knee. She requires mod assist to get to edge of bed. Min/Mod +2 assist for sit to stand. Once up she is able to ambulate 30 feet in room with RW. Improved pain reported with mobility although continues to be ~7/10. She will continue to benefit from skilled PT while here to improve functional independence and decrease pain for safe return home.          Recommendations for follow up therapy are one component of a multi-disciplinary discharge planning process, led by the attending physician.  Recommendations may be updated based on patient status, additional functional criteria and insurance authorization.  Follow Up Recommendations Home health PT    Assistance Recommended at Discharge Intermittent Supervision/Assistance  Functional Status Assessment Patient has had a recent decline in their functional status and demonstrates the ability to make significant improvements in function in a reasonable and predictable amount of time.  Equipment Recommendations  None recommended by PT    Recommendations for Other Services       Precautions / Restrictions Precautions Precautions: Fall Restrictions Weight Bearing Restrictions: No      Mobility  Bed Mobility Overal bed mobility: Needs Assistance Bed Mobility: Supine to Sit     Supine to sit: Mod assist     General bed mobility comments: Patient requires  increased time and assistance due to right knee pain    Transfers Overall transfer level: Needs assistance Equipment used: Rolling walker (2 wheels) Transfers: Sit to/from Stand Sit to Stand: Min assist;+2 physical assistance;From elevated surface           General transfer comment: requires assistance due to pain in right knee. Fearful of attempting to stand    Ambulation/Gait Ambulation/Gait assistance: Min guard Gait Distance (Feet): 30 Feet Assistive device: Rolling walker (2 wheels) Gait Pattern/deviations: Decreased step length - right;Decreased step length - left;Decreased weight shift to right;Step-through pattern Gait velocity: decr     General Gait Details: Patient able to ambulate into bathroom and then back around room to recliner. Improved pain level with mobility.  Stairs            Wheelchair Mobility    Modified Rankin (Stroke Patients Only)       Balance Overall balance assessment: Modified Independent                                           Pertinent Vitals/Pain Pain Assessment: 0-10 Pain Score: 7  Negative Vocalization: occasional moan/groan, low speech, negative/disapproving quality Facial Expression: facial grimacing Body Language: tense, distressed pacing, fidgeting Consolability: distracted or reassured by voice/touch Pain Location: R knee Pain Descriptors / Indicators: Discomfort;Grimacing;Guarding;Moaning Pain Intervention(s): Limited activity within patient's tolerance;Monitored during session;Repositioned;Ice applied    Home Living Family/patient expects to be discharged to:: Private residence Living Arrangements: Children Available Help at  Discharge: Family;Available PRN/intermittently Type of Home: House Home Access: Stairs to enter Entrance Stairs-Rails: Psychiatric nurse of Steps: 6 steps in fromt, 3 in back   Home Layout: One level Home Equipment: Grab bars - tub/shower      Prior  Function Prior Level of Function : Independent/Modified Independent             Mobility Comments: patient independently ambulating at home with RW ADLs Comments: independent with ADLs     Hand Dominance        Extremity/Trunk Assessment   Upper Extremity Assessment Upper Extremity Assessment: Overall WFL for tasks assessed    Lower Extremity Assessment Lower Extremity Assessment: RLE deficits/detail RLE Deficits / Details: very painful R knee RLE: Unable to fully assess due to pain RLE Sensation: WNL RLE Coordination: decreased gross motor    Cervical / Trunk Assessment Cervical / Trunk Assessment: Normal  Communication   Communication: No difficulties  Cognition Arousal/Alertness: Awake/alert Behavior During Therapy: WFL for tasks assessed/performed;Anxious Overall Cognitive Status: Within Functional Limits for tasks assessed                                          General Comments      Exercises Other Exercises Other Exercises: LAQ as tolerated in improve stiffness   Assessment/Plan    PT Assessment Patient needs continued PT services  PT Problem List Decreased strength;Decreased mobility;Decreased activity tolerance;Decreased balance;Pain;Decreased range of motion       PT Treatment Interventions Therapeutic activities;Gait training;Therapeutic exercise;Patient/family education;Functional mobility training;Stair training    PT Goals (Current goals can be found in the Care Plan section)  Acute Rehab PT Goals Patient Stated Goal: for patient to be able to return home, decrease pain PT Goal Formulation: With patient/family Time For Goal Achievement: 10/27/21 Potential to Achieve Goals: Good    Frequency Min 2X/week   Barriers to discharge Inaccessible home environment;Decreased caregiver support patient is alone during the day. [name] is there at night.    Co-evaluation               AM-PAC PT "6 Clicks" Mobility   Outcome Measure Help needed turning from your back to your side while in a flat bed without using bedrails?: A Lot Help needed moving from lying on your back to sitting on the side of a flat bed without using bedrails?: A Lot Help needed moving to and from a bed to a chair (including a wheelchair)?: A Little Help needed standing up from a chair using your arms (e.g., wheelchair or bedside chair)?: A Lot Help needed to walk in hospital room?: A Little Help needed climbing 3-5 steps with a railing? : A Little 6 Click Score: 15    End of Session Equipment Utilized During Treatment: Gait belt Activity Tolerance: Patient limited by pain Patient left: in chair;with call bell/phone within reach;with chair alarm set;with family/visitor present Nurse Communication: Mobility status PT Visit Diagnosis: Other abnormalities of gait and mobility (R26.89);Difficulty in walking, not elsewhere classified (R26.2);Pain Pain - Right/Left: Right Pain - part of body: Knee    Time: 5009-3818 PT Time Calculation (min) (ACUTE ONLY): 16 min   Charges:   PT Evaluation $PT Eval Moderate Complexity: 1 Mod          Adonia Porada, PT, GCS 10/20/21,12:07 PM

## 2021-10-20 NOTE — Progress Notes (Signed)
[facility]  ROUNDING NOTE   Subjective:   Patient seen sitting up in bed Alert and oriented Eating breakfast, daughter in law at bedside Complains of right knee pain and stiffness Denies nausea and vomiting Patient seen later sitting up in chair with PT  Objective:  Vital signs in last 24 hours:  Temp:  [98.1 F (36.7 C)-100.1 F (37.8 C)] 98.2 F (36.8 C) (11/09 1150) Pulse Rate:  [73-96] 80 (11/09 1150) Resp:  [17-21] 18 (11/09 1150) BP: (139-173)/(71-82) 142/80 (11/09 1150) SpO2:  [96 %-99 %] 98 % (11/09 1150) Weight:  [86.2 kg] 86.2 kg (11/08 1500)  Weight change: 13.6 kg Filed Weights   10/18/21 0853 10/19/21 1500  Weight: 72.6 kg 86.2 kg    Intake/Output: I/O last 3 completed shifts: In: 4399.9 [P.O.:240; I.V.:3977; IV Piggyback:182.9] Out: 700 [Urine:700]   Intake/Output this shift:  Total I/O In: 240 [P.O.:240] Out: -   Physical Exam: General: NAD, sitting in chair  Head: Normocephalic, atraumatic. Moist oral mucosal membranes  Eyes: Anicteric  Lungs:  Clear to auscultation, normal effort  Heart: Regular rate and rhythm  Abdomen:  Soft, nontender, nondistended  Extremities:  no peripheral edema.  Neurologic: Nonfocal, moving all four extremities  Skin: No lesions       Basic Metabolic Panel: Recent Labs  Lab 10/18/21 0903 10/18/21 1256 10/19/21 0439 10/20/21 0257  NA 134*  --  134* 133*  K 3.4*  --  3.6 4.2  CL 101  --  103 103  CO2 21*  --  20* 20*  GLUCOSE 171*  --  100* 135*  BUN 48*  --  57* 67*  CREATININE 2.18*  --  2.96* 3.78*  CALCIUM 8.3*  --  7.6* 8.1*  MG  --  1.9  --   --   PHOS  --  3.9  --   --     Liver Function Tests: Recent Labs  Lab 10/18/21 0903 10/19/21 0439  AST 636* 380*  ALT 162* 135*  ALKPHOS 80 69  BILITOT 0.9 0.8  PROT 6.1* 5.5*  ALBUMIN 3.1* 2.4*   No results for input(s): LIPASE, AMYLASE in the last 168 hours. Recent Labs  Lab 10/18/21 1557  AMMONIA 16    CBC: Recent Labs   Lab 10/18/21 0903 10/19/21 0439 10/20/21 0257  WBC 12.9* 9.9 9.5  NEUTROABS  --  7.9*  --   HGB 14.3 12.5 11.6*  HCT 42.1 38.4 34.2*  MCV 88.3 91.6 89.3  PLT 131* 106* 98*    Cardiac Enzymes: Recent Labs  Lab 10/18/21 0903 10/18/21 1557 10/19/21 0439 10/20/21 0257  CKTOTAL 14,929* 15,867* 10,710* 3,332*    BNP: Invalid input(s): POCBNP  CBG: No results for input(s): GLUCAP in the last 168 hours.  Microbiology: Results for orders placed or performed during the hospital encounter of 10/18/21  Culture, blood (routine x 2)     Status: Abnormal (Preliminary result)   Collection Time: 10/18/21  9:37 AM   Specimen: BLOOD  Result Value Ref Range Status   Specimen Description   Final    BLOOD  RFOA Performed at [facility], [address], [place], [place] [address]    Special Requests   Final    BOTTLES DRAWN AEROBIC AND ANAEROBIC  BCAV Performed at [facility], [address] [place], [place] [address]    Culture  Setup Time   Final    GRAM NEGATIVE RODS IN BOTH AEROBIC AND ANAEROBIC BOTTLES CRITICAL VALUE NOTED.  VALUE IS CONSISTENT WITH PREVIOUSLY REPORTED AND CALLED VALUE. Performed at [facility], [address], [place], [place] [address]    Culture PROTEUS MIRABILIS (A)  Final   Report Status PENDING  Incomplete  Culture, blood (routine x 2)     Status: Abnormal (Preliminary result)   Collection Time: 10/18/21  9:37 AM   Specimen: BLOOD  Result Value Ref Range Status   Specimen Description   Final    BLOOD  LAC Performed at [facility], [address], [place], [place] [address]    Special Requests   Final    BOTTLES DRAWN AEROBIC AND ANAEROBIC Blood Culture adequate volume Performed at [facility] [address], [place], [place] [address]    Culture  Setup Time   Final    Organism ID to follow GRAM NEGATIVE RODS IN BOTH AEROBIC AND ANAEROBIC BOTTLES CRITICAL RESULT CALLED TO, READ BACK BY AND  VERIFIED WITH: Lillia Abed @0031  ON 10/19/21 SKL Performed at [facility], [address] [place], [place] [address]    Culture (A)  Final    PROTEUS MIRABILIS SUSCEPTIBILITIES TO FOLLOW Performed at [facility], [place] [address], [place], [place] [address]    Report Status PENDING  Incomplete  Blood Culture ID Panel (Reflexed)     Status: Abnormal   Collection Time: 10/18/21  9:37 AM  Result Value Ref Range Status   Enterococcus faecalis NOT DETECTED NOT DETECTED Final   Enterococcus Faecium NOT DETECTED NOT DETECTED Final   Listeria monocytogenes NOT DETECTED NOT DETECTED Final   Staphylococcus species NOT DETECTED NOT DETECTED Final   Staphylococcus aureus (BCID) NOT DETECTED NOT DETECTED Final   Staphylococcus epidermidis NOT DETECTED NOT DETECTED Final   Staphylococcus lugdunensis NOT DETECTED NOT DETECTED Final   Streptococcus species NOT DETECTED NOT DETECTED Final   Streptococcus agalactiae NOT DETECTED NOT DETECTED Final   Streptococcus pneumoniae NOT DETECTED NOT DETECTED Final   Streptococcus pyogenes NOT DETECTED NOT DETECTED Final   A.calcoaceticus-baumannii NOT DETECTED NOT DETECTED Final   Bacteroides fragilis NOT DETECTED NOT DETECTED Final   Enterobacterales DETECTED (A) NOT DETECTED Final    Comment: Enterobacterales represent a large order of gram negative bacteria, not a single organism. CRITICAL RESULT CALLED TO, READ BACK BY AND VERIFIED WITH: Lillia Abed @0031  ON 10/19/21 SKL    Enterobacter cloacae complex NOT DETECTED NOT DETECTED Final   Escherichia coli NOT DETECTED NOT DETECTED Final   Klebsiella aerogenes NOT DETECTED NOT DETECTED Final   Klebsiella oxytoca NOT DETECTED NOT DETECTED Final   Klebsiella pneumoniae NOT DETECTED NOT DETECTED Final   Proteus species DETECTED (A) NOT DETECTED Final    Comment: CRITICAL RESULT CALLED TO, READ BACK BY AND VERIFIED WITH: Lillia Abed @0031  ON 10/19/21 SKL    Salmonella species NOT DETECTED  NOT DETECTED Final   Serratia marcescens NOT DETECTED NOT DETECTED Final   Haemophilus influenzae NOT DETECTED NOT DETECTED Final   Neisseria meningitidis NOT DETECTED NOT DETECTED Final   Pseudomonas aeruginosa NOT DETECTED NOT DETECTED Final   Stenotrophomonas maltophilia NOT DETECTED NOT DETECTED Final   Candida albicans NOT DETECTED NOT DETECTED Final   Candida auris NOT DETECTED NOT DETECTED Final   Candida glabrata NOT DETECTED NOT DETECTED Final   Candida krusei NOT DETECTED NOT DETECTED Final   Candida parapsilosis NOT DETECTED NOT DETECTED Final   Candida tropicalis NOT DETECTED NOT DETECTED Final   Cryptococcus neoformans/gattii NOT DETECTED NOT DETECTED Final   CTX-M  ESBL NOT DETECTED NOT DETECTED Final   Carbapenem resistance IMP NOT DETECTED NOT DETECTED Final   Carbapenem resistance KPC NOT DETECTED NOT DETECTED Final   Carbapenem resistance NDM NOT DETECTED NOT DETECTED Final   Carbapenem resist OXA 48 LIKE NOT DETECTED NOT DETECTED Final   Carbapenem resistance VIM NOT DETECTED NOT DETECTED Final    Comment: Performed at [facility], [address] [place], [place] [address]  Resp Panel by RT-PCR (Flu A&B, Covid) Nasopharyngeal Swab     Status: None   Collection Time: 10/18/21 11:15 AM   Specimen: Nasopharyngeal Swab; Nasopharyngeal(NP) swabs in vial transport medium  Result Value Ref Range Status   SARS Coronavirus 2 by RT PCR NEGATIVE NEGATIVE Final    Comment: (NOTE) SARS-CoV-2 target nucleic acids are NOT DETECTED.  The SARS-CoV-2 RNA is generally detectable in upper respiratory specimens during the acute phase of infection. The lowest concentration of SARS-CoV-2 viral copies this assay can detect is 138 copies/mL. A negative result does not preclude SARS-Cov-2 infection and should not be used as the sole basis for treatment or other patient management decisions. A negative result may occur with  improper specimen collection/handling, submission of  specimen other than nasopharyngeal swab, presence of viral mutation(s) within the areas targeted by this assay, and inadequate number of viral copies(<138 copies/mL). A negative result must be combined with clinical observations, patient history, and epidemiological information. The expected result is Negative.  Fact Sheet for Patients:  EntrepreneurPulse.com.au  Fact Sheet for Healthcare Providers:  IncredibleEmployment.be  This test is no t yet approved or cleared by the [place] FDA and  has been authorized for detection and/or diagnosis of SARS-CoV-2 by FDA under an Emergency Use Authorization (EUA). This EUA will remain  in effect (meaning this test can be used) for the duration of the COVID-19 declaration under Section 564(b)(1) of the Act, 21 U.S.C.section 360bbb-3(b)(1), unless the authorization is terminated  or revoked sooner.       Influenza A by PCR NEGATIVE NEGATIVE Final   Influenza B by PCR NEGATIVE NEGATIVE Final    Comment: (NOTE) The Xpert Xpress SARS-CoV-2/FLU/RSV plus assay is intended as an aid in the diagnosis of influenza from Nasopharyngeal swab specimens and should not be used as a sole basis for treatment. Nasal washings and aspirates are unacceptable for Xpert Xpress SARS-CoV-2/FLU/RSV testing.  Fact Sheet for Patients: EntrepreneurPulse.com.au  Fact Sheet for Healthcare Providers: IncredibleEmployment.be  This test is not yet approved or cleared by the [place] FDA and has been authorized for detection and/or diagnosis of SARS-CoV-2 by FDA under an Emergency Use Authorization (EUA). This EUA will remain in effect (meaning this test can be used) for the duration of the COVID-19 declaration under Section 564(b)(1) of the Act, 21 U.S.C. section 360bbb-3(b)(1), unless the authorization is terminated or revoked.  Performed at [facility], [address] [place], [place] [address]   MRSA Next Gen by PCR, Nasal     Status: None   Collection Time: 10/18/21 12:56 PM   Specimen: Nasal Mucosa; Nasal Swab  Result Value Ref Range Status   MRSA by PCR Next Gen NOT DETECTED NOT DETECTED Final    Comment: (NOTE) The GeneXpert MRSA Assay (FDA approved for NASAL specimens only), is one component of a comprehensive MRSA colonization surveillance program. It is not intended to diagnose MRSA infection nor to guide or monitor treatment for MRSA infections. Test performance is not FDA approved in patients less than 34 years old. Performed  at [facility], [address] [place], [place] [address]   Urine Culture     Status: None   Collection Time: 10/19/21 11:13 AM   Specimen: Urine, Random  Result Value Ref Range Status   Specimen Description   Final    URINE, RANDOM Performed at [facility], [address], [place], [place] [address]    Special Requests   Final    Normal Performed at [facility], [address] [place], [place] [address]    Culture   Final    NO GROWTH Performed at [facility], [place] [address], [place], [place] [address]    Report Status 10/20/2021 FINAL  Final    Coagulation Studies: Recent Labs    10/19/21 0439  LABPROT 12.9  INR 1.0    Urinalysis: Recent Labs    10/19/21 1113  COLORURINE AMBER*  LABSPEC 1.015  PHURINE 5.0  GLUCOSEU 50*  HGBUR LARGE*  BILIRUBINUR NEGATIVE  KETONESUR NEGATIVE  PROTEINUR 100*  NITRITE NEGATIVE  LEUKOCYTESUR LARGE*      Imaging: US RENAL  Result Date: 10/19/2021 CLINICAL DATA:  Acute kidney injury. EXAM: RENAL / URINARY TRACT ULTRASOUND COMPLETE COMPARISON:  CT abdomen and pelvis 10/18/2021 FINDINGS: Right Kidney: Renal measurements: 12.1 x 5.9 x 5.8 cm = volume: 219 mL. Echogenicity within normal limits. No mass or hydronephrosis visualized. Left Kidney: Renal measurements: 11.7 x 5.7 x 5.4 cm = volume: 188 mL. Echogenicity  within normal limits. No mass or hydronephrosis visualized. 7 mm shadowing stone in the lower pole. Bladder: Appears normal for degree of bladder distention. Other: None. IMPRESSION: 7 mm left renal calculus. No hydronephrosis. Electronically Signed   By: Logan Bores M.D.   On: 10/19/2021 13:45     Medications:    cefTRIAXone (ROCEPHIN)  IV 2 g (10/20/21 1141)   lactated ringers 75 mL/hr at 10/20/21 0531    amLODipine  2.5 mg Oral Daily   busPIRone  10 mg Oral BID   cholecalciferol  1,000 Units Oral Daily   citalopram  40 mg Oral Daily   fluticasone furoate-vilanterol  1 puff Inhalation Daily   And   umeclidinium bromide  1 puff Inhalation Daily   heparin  5,000 Units Subcutaneous Q8H   lidocaine  1 patch Transdermal Q24H   loratadine  10 mg Oral Daily   traZODone  100 mg Oral QHS   vitamin B-12  1,000 mcg Oral Daily   acetaminophen **OR** acetaminophen, albuterol, LORazepam, ondansetron **OR** ondansetron (ZOFRAN) IV, oxyCODONE  Assessment/ Plan:  Ms. TINNIE KUNIN is a 75 y.o.  female with past medical conditions including hypertension, hyperlipidemia, anxiety and depression, pulmonary hypertension, and asthma, who was admitted to [facility] on 10/18/2021 for Acute kidney injury ([place]) [N17.9] Severe sepsis with acute organ dysfunction ([place]) [A41.9, R65.20] Non-traumatic rhabdomyolysis [M62.82] Altered mental status, unspecified altered mental status type [R41.82] Sepsis with acute renal failure without septic shock, due to unspecified organism, unspecified acute renal failure type ([place]) [A41.9, R65.20, N17.9]   Acute Kidney Injury with hematuria and proteinuria. Baseline creatinine 0.84 on 07/29/21.  Acute kidney injury secondary to sepsis and rhabdomyolysis Renal ultrasound negative for obstruction, but positive for a 7 mm left renal calculus.  No IV contrast exposure.  No indication for dialysis at this time.  SPEP, UPEP, ANCA, ANA pending. Complements are normal.  Creatinine  continues to increase, which is expected with the amount of muscle breakdown.  CK greatly decreased.  Continue IV hydration and patient  is encouraged to maintain oral intake of fluids and nutrition.  Lab Results  Component Value Date   CREATININE 3.78 (H) 10/20/2021   CREATININE 2.96 (H) 10/19/2021   CREATININE 2.18 (H) 10/18/2021    Intake/Output Summary (Last 24 hours) at 10/20/2021 1315 Last data filed at 10/20/2021 1019 Gross per 24 hour  Intake 4542.7 ml  Output 300 ml  Net 4242.7 ml   2. Hypertension Home regimen includes Amlodipine.  All medications held at this time.  BP 142/80   3. Hyponatremia likely due to kidney injury.  Remains low but will correct with treatment of underlying cause   4. Severe Sepsis with hypotension and acute metabolic encephalopathy. Blood cultures growing proteus.  Receiving Rocephin  Obtaining ID consult tomorrow   LOS: 2   11/9/20221:15 PM

## 2021-10-20 NOTE — Evaluation (Signed)
Occupational Therapy Evaluation Patient Details Name: Nancy Blackburn MRN: [id number] DOB: 06-10-1946 Today's Date: 10/20/2021   History of Present Illness Patient is alert and awake.  She came in not feeling well.  Started on Sunday night.  Was found on Monday morning.  She was on the floor for an undetermined amount of time.  Admitted with sepsis, acute kidney injury and rhabdomyolysis. Patient with recent knee replacement and is having severe pain in right knee.   Clinical Impression   Patient presenting with decreased Ind in self care, balance, functional mobility/transfers, endurance, and safety awareness. Patient reports being independent at baseline and grandson (age 4) lives with her. Pt reports Ind in self care, functional mobility, and IADLs. [name] does work during the day. Pt does report 7/10 pain in R knee which limited pt's performance. Pt needing mod A to stand from bed and elevated commode chair this session. Min A for ambulation with use of RW. Pt  needs assistance for clothing management with toileting.  Patient will benefit from acute OT to increase overall independence in the areas of ADLs, functional mobility, and safety awareness in order to safely discharge to home. Family would need to be able to assist pt intermittently when up during the day for safety at discharge.      Recommendations for follow up therapy are one component of a multi-disciplinary discharge planning process, led by the attending physician.  Recommendations may be updated based on patient status, additional functional criteria and insurance authorization.   Follow Up Recommendations  Home health OT (    Assistance Recommended at Discharge Intermittent Supervision/Assistance  Functional Status Assessment  Patient has had a recent decline in their functional status and demonstrates the ability to make significant improvements in function in a reasonable and predictable amount of time.  Equipment  Recommendations  None recommended by OT       Precautions / Restrictions Precautions Precautions: Fall Restrictions Weight Bearing Restrictions: No      Mobility Bed Mobility Overal bed mobility: Needs Assistance Bed Mobility: Supine to Sit     Supine to sit: Mod assist     General bed mobility comments: Patient requires increased time and assistance due to right knee pain    Transfers Overall transfer level: Needs assistance Equipment used: Rolling walker (2 wheels) Transfers: Sit to/from Stand Sit to Stand: Min assist;+2 physical assistance;From elevated surface           General transfer comment: requires assistance due to pain in right knee. Fearful of attempting to stand      Balance Overall balance assessment: Needs assistance Sitting-balance support: Feet supported;Bilateral upper extremity supported Sitting balance-Leahy Scale: Good     Standing balance support: Reliant on assistive device for balance;Bilateral upper extremity supported;During functional activity Standing balance-Leahy Scale: Fair                             ADL either performed or assessed with clinical judgement   ADL Overall ADL's : Needs assistance/impaired     Grooming: Wash/dry hands;Sitting;Set up               Lower Body Dressing: Moderate assistance;Sit to/from stand   Toilet Transfer: Moderate assistance;Comfort height toilet;Ambulation Toilet Transfer Details (indicate cue type and reason): mod lifting assistance Toileting- Clothing Manipulation and Hygiene: Moderate assistance Toileting - Clothing Manipulation Details (indicate cue type and reason): Pt performs hygiene but needs assist with clothing managment  Vision Patient Visual Report: No change from baseline              Pertinent Vitals/Pain Pain Assessment: 0-10 Pain Score: 7  Negative Vocalization: occasional moan/groan, low speech, negative/disapproving quality Facial  Expression: facial grimacing Body Language: tense, distressed pacing, fidgeting Consolability: distracted or reassured by voice/touch Pain Location: R knee Pain Descriptors / Indicators: Discomfort;Grimacing;Guarding;Moaning Pain Intervention(s): Limited activity within patient's tolerance;Monitored during session;Repositioned     Hand Dominance Right   Extremity/Trunk Assessment Upper Extremity Assessment Upper Extremity Assessment: Generalized weakness   Lower Extremity Assessment Lower Extremity Assessment: Defer to PT evaluation RLE Deficits / Details: very painful R knee RLE: Unable to fully assess due to pain RLE Sensation: WNL RLE Coordination: decreased gross motor   Cervical / Trunk Assessment Cervical / Trunk Assessment: Normal   Communication Communication Communication: No difficulties   Cognition Arousal/Alertness: Awake/alert Behavior During Therapy: WFL for tasks assessed/performed;Anxious Overall Cognitive Status: Within Functional Limits for tasks assessed                                          Exercises Other Exercises Other Exercises: LAQ as tolerated in improve stiffness        Home Living Family/patient expects to be discharged to:: Private residence Living Arrangements: Children Available Help at Discharge: Family;Available PRN/intermittently Type of Home: House Home Access: Stairs to enter;Ramped entrance Entrance Stairs-Number of Steps: 6 steps in front, 3 in back Entrance Stairs-Rails: Right;Left Home Layout: One level     Bathroom Shower/Tub: Teacher, early years/pre: Standard Bathroom Accessibility: Yes   Home Equipment: Grab bars - tub/shower          Prior Functioning/Environment Prior Level of Function : Independent/Modified Independent             Mobility Comments: patient independently ambulating at home with RW ADLs Comments: independent with ADLs        OT Problem List: Decreased  strength;Pain;Decreased activity tolerance;Decreased safety awareness;Impaired balance (sitting and/or standing);Decreased knowledge of precautions      OT Treatment/Interventions: Self-care/ADL training;Therapeutic exercise;Therapeutic activities;Energy conservation;Visual/perceptual remediation/compensation;DME and/or AE instruction;Patient/family education;Balance training    OT Goals(Current goals can be found in the care plan section) Acute Rehab OT Goals Patient Stated Goal: to decrease pain OT Goal Formulation: With patient Time For Goal Achievement: 11/03/21 Potential to Achieve Goals: Good ADL Goals Pt Will Perform Grooming: with supervision;standing Pt Will Perform Lower Body Dressing: with supervision;sit to/from stand Pt Will Transfer to Toilet: with supervision;ambulating Pt Will Perform Toileting - Clothing Manipulation and hygiene: with supervision;sit to/from stand  OT Frequency: Min 2X/week   Barriers to D/C:    none known at this time          AM-PAC OT "6 Clicks" Daily Activity     Outcome Measure Help from another person eating meals?: None Help from another person taking care of personal grooming?: A Little Help from another person toileting, which includes using toliet, bedpan, or urinal?: A Lot Help from another person bathing (including washing, rinsing, drying)?: A Lot Help from another person to put on and taking off regular upper body clothing?: A Little Help from another person to put on and taking off regular lower body clothing?: A Lot 6 Click Score: 16   End of Session Equipment Utilized During Treatment: Standard Walker Nurse Communication: Mobility status;Patient requests pain meds  Activity Tolerance: Patient  limited by pain Patient left: in chair;with call bell/phone within reach;with chair alarm set  OT Visit Diagnosis: Unsteadiness on feet (R26.81);Muscle weakness (generalized) (M62.81)                Time: 9969-2493 OT Time Calculation  (min): 25 min Charges:  OT General Charges $OT Visit: 1 Visit OT Evaluation $OT Eval Low Complexity: 1 Low OT Treatments $Self Care/Home Management : 8-22 mins Darleen Crocker, MS, OTR/L , CBIS ascom [phone]  10/20/21, 1:37 PM

## 2021-10-21 DIAGNOSIS — G9341 Metabolic encephalopathy: Secondary | ICD-10-CM | POA: Diagnosis not present

## 2021-10-21 DIAGNOSIS — M25561 Pain in right knee: Secondary | ICD-10-CM | POA: Diagnosis not present

## 2021-10-21 DIAGNOSIS — N179 Acute kidney failure, unspecified: Secondary | ICD-10-CM | POA: Diagnosis not present

## 2021-10-21 DIAGNOSIS — N390 Urinary tract infection, site not specified: Secondary | ICD-10-CM | POA: Diagnosis not present

## 2021-10-21 DIAGNOSIS — A4159 Other Gram-negative sepsis: Principal | ICD-10-CM

## 2021-10-21 LAB — CULTURE, BLOOD (ROUTINE X 2): Special Requests: ADEQUATE

## 2021-10-21 LAB — BASIC METABOLIC PANEL
Anion gap: 8 (ref 5–15)
BUN: 59 mg/dL — ABNORMAL HIGH (ref 8–23)
CO2: 21 mmol/L — ABNORMAL LOW (ref 22–32)
Calcium: 8.2 mg/dL — ABNORMAL LOW (ref 8.9–10.3)
Chloride: 105 mmol/L (ref 98–111)
Creatinine, Ser: 3.9 mg/dL — ABNORMAL HIGH (ref 0.44–1.00)
GFR, Estimated: 11 mL/min — ABNORMAL LOW (ref >60)
Glucose, Bld: 107 mg/dL — ABNORMAL HIGH (ref 70–99)
Potassium: 3.4 mmol/L — ABNORMAL LOW (ref 3.5–5.1)
Sodium: 134 mmol/L — ABNORMAL LOW (ref 135–145)

## 2021-10-21 LAB — VITAMIN B1: Vitamin B1 (Thiamine): 156.6 nmol/L (ref 66.5–200.0)

## 2021-10-21 LAB — PROTEIN ELECTROPHORESIS, SERUM
A/G Ratio: 0.9 (ref 0.7–1.7)
Albumin ELP: 2.1 g/dL — ABNORMAL LOW (ref 2.9–4.4)
Alpha-1-Globulin: 0.4 g/dL (ref 0.0–0.4)
Alpha-2-Globulin: 0.9 g/dL (ref 0.4–1.0)
Beta Globulin: 0.7 g/dL (ref 0.7–1.3)
Gamma Globulin: 0.4 g/dL (ref 0.4–1.8)
Globulin, Total: 2.4 g/dL (ref 2.2–3.9)
Total Protein ELP: 4.5 g/dL — ABNORMAL LOW (ref 6.0–8.5)

## 2021-10-21 LAB — CK: Total CK: 874 U/L — ABNORMAL HIGH (ref 38–234)

## 2021-10-21 NOTE — Progress Notes (Signed)
Physical Therapy Treatment Patient Details Name: Nancy Blackburn MRN: [id number] DOB: 1946-07-06 Today's Date: 10/21/2021   History of Present Illness Patient is alert and awake.  She came in not feeling well.  Started on Sunday night.  Was found on Monday morning.  She was on the floor for an undetermined amount of time.  Admitted with sepsis, acute kidney injury and rhabdomyolysis. Patient with recent knee replacement and is having severe pain in right knee.    PT Comments    Patient received in bed, sleeping, friend in room. Patient easily wakes to our voices. She is agreeable to PT. Found to be in soaking wet bed. Assisted patient to bathroom to get cleaned up and stripped bed. Patient continues to require some assistance bringing R LE off bed due to pain. Increased time needed as well due to pain. She requires min assist for sit to stand transfers and min guard to supervision for ambulation with RW in room. Patient will continue to benefit from skilled PT while here to improve functional mobility especially transfers to return home safely.      Recommendations for follow up therapy are one component of a multi-disciplinary discharge planning process, led by the attending physician.  Recommendations may be updated based on patient status, additional functional criteria and insurance authorization.  Follow Up Recommendations  Home health PT     Assistance Recommended at Discharge Intermittent Supervision/Assistance  Equipment Recommendations  None recommended by PT    Recommendations for Other Services       Precautions / Restrictions Precautions Precautions: Fall Restrictions Weight Bearing Restrictions: No     Mobility  Bed Mobility Overal bed mobility: Needs Assistance Bed Mobility: Supine to Sit     Supine to sit: Min assist     General bed mobility comments: continues to require min assist moving R LE off bed due to pain    Transfers Overall transfer level:  Needs assistance Equipment used: Rolling walker (2 wheels) Transfers: Sit to/from Stand Sit to Stand: Min guard           General transfer comment: min guard for safety with transfers. Increased time and effort needed due to pain    Ambulation/Gait Ambulation/Gait assistance: Min guard Gait Distance (Feet): 30 Feet Assistive device: Rolling walker (2 wheels) Gait Pattern/deviations: Step-through pattern;Decreased step length - right;Decreased step length - left Gait velocity: decr     General Gait Details: Patient able to ambulate into bathroom and then back around room to recliner. Improved pain level with mobility.   Stairs             Wheelchair Mobility    Modified Rankin (Stroke Patients Only)       Balance Overall balance assessment: Needs assistance Sitting-balance support: Feet supported Sitting balance-Leahy Scale: Good     Standing balance support: Bilateral upper extremity supported;During functional activity Standing balance-Leahy Scale: Fair Standing balance comment: patient is able to stand at commode with no UE support to don pull ups, min guard for safety, otherwise needing B UE support with mobiltiy                            Cognition Arousal/Alertness: Awake/alert Behavior During Therapy: WFL for tasks assessed/performed Overall Cognitive Status: Within Functional Limits for tasks assessed  Exercises      General Comments        Pertinent Vitals/Pain Pain Assessment: 0-10 Pain Score: 7  Pain Location: R knee Pain Descriptors / Indicators: Discomfort;Sore;Grimacing;Guarding Pain Intervention(s): Monitored during session;Limited activity within patient's tolerance;Ice applied;Repositioned    Home Living                          Prior Function            PT Goals (current goals can now be found in the care plan section) Acute Rehab PT  Goals Patient Stated Goal: for patient to be able to return home, decrease pain PT Goal Formulation: With patient Time For Goal Achievement: 10/27/21 Potential to Achieve Goals: Good Progress towards PT goals: Progressing toward goals    Frequency    Min 2X/week      PT Plan Current plan remains appropriate    Co-evaluation              AM-PAC PT "6 Clicks" Mobility   Outcome Measure  Help needed turning from your back to your side while in a flat bed without using bedrails?: A Little Help needed moving from lying on your back to sitting on the side of a flat bed without using bedrails?: A Little Help needed moving to and from a bed to a chair (including a wheelchair)?: A Little Help needed standing up from a chair using your arms (e.g., wheelchair or bedside chair)?: A Little Help needed to walk in hospital room?: A Little Help needed climbing 3-5 steps with a railing? : A Little 6 Click Score: 18    End of Session Equipment Utilized During Treatment: Gait belt Activity Tolerance: Patient limited by pain Patient left: in chair;with call bell/phone within reach;with chair alarm set;with family/visitor present Nurse Communication: Mobility status PT Visit Diagnosis: Unsteadiness on feet (R26.81);Other abnormalities of gait and mobility (R26.89);Pain Pain - Right/Left: Right Pain - part of body: Knee     Time: 2025-4270 PT Time Calculation (min) (ACUTE ONLY): 28 min  Charges:  $Gait Training: 8-22 mins $Therapeutic Activity: 8-22 mins                     Marny Smethers, PT, GCS 10/21/21,10:47 AM

## 2021-10-21 NOTE — Progress Notes (Signed)
Occupational Therapy Treatment Patient Details Name: Nancy Blackburn MRN: [id number] DOB: 1946/06/21 Today's Date: 10/21/2021   History of present illness Patient is alert and awake.  She came in not feeling well.  Started on Sunday night.  Was found on Monday morning.  She was on the floor for an undetermined amount of time.  Admitted with sepsis, acute kidney injury and rhabdomyolysis. Patient with recent knee replacement and is having severe pain in right knee.   OT comments  Upon entering the room, pt seated in recliner chair and agreeable to OT intervention. Pt standing from recliner chair with min guard and ambulating to sink to comb hair in mirror and wash face. Pt standing for ~ 10 minutes. Pt does report increased pain while standing and asking RN for medication this session. Pt ambulates back to recliner chair with min guard and use of RW. Ice pack utilized on R knee. All needs within reach. Pt continues to benefit from OT intervention with recommendation for HHOT at discharge to continue to address functional deficits.    Recommendations for follow up therapy are one component of a multi-disciplinary discharge planning process, led by the attending physician.  Recommendations may be updated based on patient status, additional functional criteria and insurance authorization.    Follow Up Recommendations  Home health OT    Assistance Recommended at Discharge Intermittent Supervision/Assistance  Equipment Recommendations  None recommended by OT       Precautions / Restrictions Precautions Precautions: Fall Restrictions Weight Bearing Restrictions: No       Mobility Bed Mobility               General bed mobility comments: seated in recliner chair    Transfers Overall transfer level: Needs assistance Equipment used: Rolling walker (2 wheels) Transfers: Sit to/from Stand Sit to Stand: Min guard                 Balance Overall balance assessment: Needs  assistance Sitting-balance support: Feet supported Sitting balance-Leahy Scale: Good     Standing balance support: Bilateral upper extremity supported;During functional activity Standing balance-Leahy Scale: Fair                             ADL either performed or assessed with clinical judgement   ADL Overall ADL's : Needs assistance/impaired     Grooming: Wash/dry hands;Wash/dry face;Standing;Supervision/safety                                      Extremity/Trunk Assessment Upper Extremity Assessment Upper Extremity Assessment: Generalized weakness   Lower Extremity Assessment Lower Extremity Assessment: Generalized weakness        Vision Patient Visual Report: No change from baseline            Cognition Arousal/Alertness: Awake/alert Behavior During Therapy: WFL for tasks assessed/performed Overall Cognitive Status: Within Functional Limits for tasks assessed                                                       Pertinent Vitals/ Pain       Pain Assessment: 0-10 Pain Score: 5  Pain Location: R knee Pain Descriptors / Indicators: Discomfort;Sore;Grimacing;Guarding Pain Intervention(s): Limited  activity within patient's tolerance;Monitored during session;Ice applied;Repositioned         Frequency  Min 2X/week        Progress Toward Goals  OT Goals(current goals can now be found in the care plan section)  Progress towards OT goals: Progressing toward goals  Acute Rehab OT Goals Patient Stated Goal: to decrease pain OT Goal Formulation: With patient Time For Goal Achievement: 11/03/21 Potential to Achieve Goals: Good  Plan Discharge plan remains appropriate;Frequency remains appropriate       AM-PAC OT "6 Clicks" Daily Activity     Outcome Measure   Help from another person eating meals?: None Help from another person taking care of personal grooming?: A Little Help from another person  toileting, which includes using toliet, bedpan, or urinal?: A Little Help from another person bathing (including washing, rinsing, drying)?: A Little Help from another person to put on and taking off regular upper body clothing?: A Little Help from another person to put on and taking off regular lower body clothing?: A Lot 6 Click Score: 18    End of Session Equipment Utilized During Treatment: Standard Walker  OT Visit Diagnosis: Unsteadiness on feet (R26.81);Muscle weakness (generalized) (M62.81)   Activity Tolerance Patient limited by pain   Patient Left in chair;with call bell/phone within reach;with chair alarm set   Nurse Communication Mobility status;Patient requests pain meds        Time: 6381-7711 OT Time Calculation (min): 17 min  Charges: OT General Charges $OT Visit: 1 Visit OT Treatments $Self Care/Home Management : 8-22 mins  Darleen Crocker, MS, OTR/L , CBIS ascom [phone]  10/21/21, 4:23 PM

## 2021-10-21 NOTE — Consult Note (Signed)
NAME: Nancy Blackburn  DOB: 04-06-1946  MRN: [id number]  Date/Time: 10/21/2021 2:03 PM  REQUESTING PROVIDER: Dr.Wieting Subjective:  REASON FOR CONSULT: BActeremia ?history from patient Son at bed side LYNNETTA Blackburn is a 75 y.o. with a history of RT TKA ( 03/09/21), HTN, asthma , neurocog impairment, PHT, mild asthma, parkinsons, dementia Presented to ED , brought in by grandson for confusion that started yesterday, found on the floor the morning of presentation. As per aptient on Saturday she went to see her daughter who was admitted at [facility] for a bleeding ulcer. She felt tired after she came home. The next day she did not go to church and slept the whole day.  The grandson who lived with her had mentioned that she was confused. He went to check on her that morning and found her on the floor in the bathroom. She does not remember falling, not sure how long she stayed there and was not sure whether she lost consciousness. She apparently did not have any fever, cough, vomiting or diarrhea before She had rt knee pain . That was the knee she had TKA in March 2022 In the ED vitals 82/61, Pulse 106, Temp 98.5, RR 22, sats 87% On presentation she was somnolent but arousable to voice , oriented to place, person, time and situation. Labs showed WBC 12.9, HB 14.3 PLT 131, cr 2.18, K 3.4, glucose 171 CK was elevated at 14,929, BNP 229 Troponin 292 and procal 47 She received vanco, cefepime and flagyl after blood culture sent. UA > 50 wbc but no urine culture sent on day 1. CT head and neck no acute findings CT abdomen showed left kidney inflamed, with mild to moderate pararenal space inflammatory stranding. There is lower pole nephrolithiasis or nephrocalcinosis. Mild if any associated hydronephrosis, but there is mild asymmetric hydroureter and periureteral stranding. However, no ureteral calculus is identified. large, circumscribed, round 8.5 cm diameter cystic mass in the left hemipelvis with  simple fluid density. Blood culture came back positive for proteus I am asked to see the patient for the bacteremia Pt says she has urinary incontinence , has a h/o bladder sling surgery, has been following with Dr.Mcdiermod and has a bladder stimulator placed in her ankle( I cannot see any of urology notes in EPIC). The stimulator is to be changed next Tuesday She also was told about a ovarian cyst many years ago In 2016 she had pain abdomen and US of the plevis showed a 3 cm rt ovarian cyst ( eventhough the rt ovary was not well defined) During this admission the CT abdomen is showing a 9.5 cm cyst from left hemipelvis   Past Medical History:  Diagnosis Date   Asthma    Bradycardia    Cervical cancer ([place]) 1978   Chronic sinus infection    Ejection fraction    a. EF 55-65%, echo, mild LVH, June, 2013, small pericardial effusion   Fracture, sacrum/coccyx ([place])    History of kidney stones    Hyperlipidemia    a. takes Simvastatin   Hypertension    a. takes Metoprolol, Lisinopril, and amlodipine.   Low back pain    Mild dementia    Nasal polyps    Normal nuclear stress test    a. 07/2005 Nl nuc stress test.   Osteopenia    Osteoporosis    Palpitations    a. June 2013:  Event recorder normal sinus rhythm with rare PVCs - ? symptomatic PVC's.   Parkinson disease ([place])  Pericardial effusion    a. Small, echo, June, 2013   PONV (postoperative nausea and vomiting)     Past Surgical History:  Procedure Laterality Date   BACK SURGERY  08/2010   BLADDER SUSPENSION  2003   bone spur  2009   removed from right collar bone area and partial collar bone removed   CATARACT EXTRACTION W/ INTRAOCULAR LENS  IMPLANT, BILATERAL     EYE SURGERY     LUMBAR FUSION  2011, 2012   SHOULDER ARTHROSCOPY Right 2012   THYROIDECTOMY, PARTIAL  1976   TONSILLECTOMY     as child   TOTAL KNEE ARTHROPLASTY Right 03/09/2021   Procedure: Right TOTAL KNEE ARTHROPLASTY;  Surgeon: Thornton Park, MD;   Location: [facility] ORS;  Service: Orthopedics;  Laterality: Right;   TUBAL LIGATION  1977   VAGINAL HYSTERECTOMY  1978   d/t cervical cancer    Social History   Socioeconomic History   Marital status: Divorced    Spouse name: Not on file   Number of children: 2   Years of education: Not on file   Highest education level: 12th grade  Occupational History   Occupation: retired  Tobacco Use   Smoking status: Never   Smokeless tobacco: Never  Vaping Use   Vaping Use: Never used  Substance and Sexual Activity   Alcohol use: Yes    Alcohol/week: 0.0 standard drinks    Comment: rarely   Drug use: No   Sexual activity: Never  Other Topics Concern   Not on file  Social History Narrative   Not on file   Social Determinants of Health   Financial Resource Strain: Not on file  Food Insecurity: Not on file  Transportation Needs: Not on file  Physical Activity: Not on file  Stress: Not on file  Social Connections: Not on file  Intimate Partner Violence: Not on file    Family History  Problem Relation Age of Onset   Colon cancer Mother    Heart murmur Mother    Heart disease Mother    Angina Mother    Heart disease Father    COPD Father    Atrial fibrillation Brother    Cushing syndrome Daughter    Cancer Daughter        thyroid   Breast cancer Cousin    Hypotension Neg Hx    Malignant hyperthermia Neg Hx    Pseudochol deficiency Neg Hx    Allergies  Allergen Reactions   Anti-Inflammatory Enzyme [Nutritional Supplements] Other (See Comments)    REACTION: Due to bleeding ulcer   Cymbalta [Duloxetine Hcl] Other (See Comments)    REACTION: Urinary retention   Clarithromycin Other (See Comments)    Upsets stomach   Donepezil Hcl Other (See Comments)    Significant bradycardia causing syncope   Nsaids     GI BLEED   Ranitidine     severe diarrhea   Venlafaxine     severe depression   I? Current Facility-Administered Medications  Medication Dose Route Frequency  Provider Last Rate Last Admin   acetaminophen (TYLENOL) tablet 650 mg  650 mg Oral Q6H PRN Cox, Amy N, DO   650 mg at 10/21/21 1610   Or   acetaminophen (TYLENOL) suppository 650 mg  650 mg Rectal Q6H PRN Cox, Amy N, DO       albuterol (PROVENTIL) (2.5 MG/3ML) 0.083% nebulizer solution 3 mL  3 mL Inhalation Q6H PRN Cox, Amy N, DO  amLODipine (NORVASC) tablet 2.5 mg  2.5 mg Oral Daily Loletha Grayer, MD   2.5 mg at 10/21/21 0757   busPIRone (BUSPAR) tablet 10 mg  10 mg Oral BID Loletha Grayer, MD   10 mg at 10/21/21 0756   cefTRIAXone (ROCEPHIN) 2 g in sodium chloride 0.9 % 100 mL IVPB  2 g Intravenous Q24H Rise Patience, MD 200 mL/hr at 10/21/21 1220 2 g at 10/21/21 1220   cholecalciferol (VITAMIN D3) tablet 1,000 Units  1,000 Units Oral Daily Loletha Grayer, MD   1,000 Units at 10/21/21 0757   citalopram (CELEXA) tablet 40 mg  40 mg Oral Daily Loletha Grayer, MD   40 mg at 10/21/21 0757   fluticasone furoate-vilanterol (BREO ELLIPTA) 200-25 MCG/ACT 1 puff  1 puff Inhalation Daily Benita Gutter, RPH   1 puff at 10/21/21 0800   And   umeclidinium bromide (INCRUSE ELLIPTA) 62.5 MCG/ACT 1 puff  1 puff Inhalation Daily Benita Gutter, RPH   1 puff at 10/21/21 0800   heparin injection 5,000 Units  5,000 Units Subcutaneous Q8H Cox, Amy N, DO   5,000 Units at 10/21/21 9604   lactated ringers infusion   Intravenous Continuous Loletha Grayer, MD 75 mL/hr at 10/20/21 0531 New Bag at 10/20/21 0531   lidocaine (LIDODERM) 5 % 1 patch  1 patch Transdermal Q24H Loletha Grayer, MD   1 patch at 10/21/21 1220   loratadine (CLARITIN) tablet 10 mg  10 mg Oral Daily Loletha Grayer, MD   10 mg at 10/21/21 0757   LORazepam (ATIVAN) tablet 0.5 mg  0.5 mg Oral TID PRN Loletha Grayer, MD   0.5 mg at 10/21/21 0757   ondansetron (ZOFRAN) tablet 4 mg  4 mg Oral Q6H PRN Cox, Amy N, DO       Or   ondansetron (ZOFRAN) injection 4 mg  4 mg Intravenous Q6H PRN Cox, Amy N, DO       oxyCODONE (Oxy  IR/ROXICODONE) immediate release tablet 2.5 mg  2.5 mg Oral Q4H PRN Loletha Grayer, MD   2.5 mg at 10/21/21 1221   traZODone (DESYREL) tablet 100 mg  100 mg Oral QHS Loletha Grayer, MD   100 mg at 10/20/21 2103   vitamin B-12 (CYANOCOBALAMIN) tablet 1,000 mcg  1,000 mcg Oral Daily Loletha Grayer, MD   1,000 mcg at 10/21/21 0757     Abtx:  Anti-infectives (From admission, onward)    Start     Dose/Rate Route Frequency Ordered Stop   10/19/21 1200  cefTRIAXone (ROCEPHIN) 2 g in sodium chloride 0.9 % 100 mL IVPB        2 g 200 mL/hr over 30 Minutes Intravenous Every 24 hours 10/19/21 0106     10/19/21 1000  ceFEPIme (MAXIPIME) 2 g in sodium chloride 0.9 % 100 mL IVPB  Status:  Discontinued        2 g 200 mL/hr over 30 Minutes Intravenous Every 24 hours 10/18/21 1236 10/19/21 0104   10/19/21 0000  metroNIDAZOLE (FLAGYL) IVPB 500 mg  Status:  Discontinued        500 mg 100 mL/hr over 60 Minutes Intravenous Every 12 hours 10/18/21 1223 10/19/21 0104   10/18/21 1245  vancomycin (VANCOREADY) IVPB 1500 mg/300 mL        1,500 mg 150 mL/hr over 120 Minutes Intravenous  Once 10/18/21 1235 10/18/21 1533   10/18/21 1245  azithromycin (ZITHROMAX) 500 mg in sodium chloride 0.9 % 250 mL IVPB  500 mg 250 mL/hr over 60 Minutes Intravenous  Once 10/18/21 1237 10/18/21 1356   10/18/21 1235  vancomycin variable dose per unstable renal function (pharmacist dosing)  Status:  Discontinued         Does not apply See admin instructions 10/18/21 1235 10/19/21 0105   10/18/21 1115  ceFEPIme (MAXIPIME) 2 g in sodium chloride 0.9 % 100 mL IVPB        2 g 200 mL/hr over 30 Minutes Intravenous  Once 10/18/21 1109 10/18/21 1247   10/18/21 1115  metroNIDAZOLE (FLAGYL) IVPB 500 mg        500 mg 100 mL/hr over 60 Minutes Intravenous  Once 10/18/21 1109 10/18/21 1252   10/18/21 1115  vancomycin (VANCOCIN) IVPB 1000 mg/200 mL premix  Status:  Discontinued        1,000 mg 200 mL/hr over 60 Minutes Intravenous   Once 10/18/21 1109 10/18/21 1234       REVIEW OF SYSTEMS:  Const: negative fever, negative chills, negative weight loss Eyes: negative diplopia or visual changes, negative eye pain ENT: negative coryza, negative sore throat Resp: negative cough, hemoptysis, has dyspnea Cards: negative for chest pain, palpitations, lower extremity edema GU: negative for frequency, dysuria and hematuria, has lower abdominal pain, has incontinence GI: Negative for abdominal pain, diarrhea, bleeding, constipation Skin: negative for rash and pruritus Heme: negative for easy bruising and gum/nose bleeding MS: generalized body ache, knee pain Neurolo:had confusion, but none now, has tremors Psych: anxiety, depression  Endocrine: negative for thyroid, diabetes Allergy/Immunology- allergies as mentioned above Objective:  VITALS:  BP (!) 155/69 (BP Location: Right Arm)   Pulse 82   Temp 98.1 F (36.7 C)   Resp 15   Ht 5\' 7"  (1.702 m)   Wt 86.2 kg   SpO2 95%   BMI 29.76 kg/m  PHYSICAL EXAM:  General: Alert, cooperative, no distress, appears stated age. Oriented in person, place, time . Feeling cold Head: Normocephalic, without obvious abnormality, atraumatic. Eyes: Conjunctivae clear, anicteric sclerae. Pupils are equal ENT Nares normal. No drainage or sinus tenderness. Lips, mucosa, N and tongue coated Neck: Supple, symmetrical, no adenopathy, thyroid: non tender no carotid bruit and no JVD. Back: No CVA tenderness. Lungs: Clear to auscultation bilaterally. No Wheezing or Rhonchi. No rales. Heart: Regular rate and rhythm, no murmur, rub or gallop. Abdomen: Soft, non-tender,not distended. Bowel sounds normal. No masses Extremities: rt knee surgical sac healed well- no erythema Mild swelling present- no erythema or warmth to touch Some pain on flexion but she is able to walk and feels better with walking Skin: No rashes or lesions. Or bruising Lymph: Cervical, supraclavicular normal. Neurologic:  Grossly non-focal Pertinent Labs Lab Results CBC    Component Value Date/Time   WBC 9.5 10/20/2021 0257   RBC 3.83 (L) 10/20/2021 0257   HGB 11.6 (L) 10/20/2021 0257   HGB 13.9 07/29/2021 1522   HCT 34.2 (L) 10/20/2021 0257   HCT 43.2 07/29/2021 1522   PLT 98 (L) 10/20/2021 0257   PLT 302 07/29/2021 1522   MCV 89.3 10/20/2021 0257   MCV 89 07/29/2021 1522   MCV 90 10/28/2014 1755   MCH 30.3 10/20/2021 0257   MCHC 33.9 10/20/2021 0257   RDW 13.3 10/20/2021 0257   RDW 13.6 07/29/2021 1522   RDW 12.2 10/28/2014 1755   LYMPHSABS 0.9 10/19/2021 0439   LYMPHSABS 2.4 07/29/2021 1522   LYMPHSABS 3.3 10/28/2014 1755   MONOABS 1.0 10/19/2021 0439   MONOABS 1.2 (H) 10/28/2014 1755  EOSABS 0.0 10/19/2021 0439   EOSABS 0.2 07/29/2021 1522   EOSABS 0.4 10/28/2014 1755   BASOSABS 0.0 10/19/2021 0439   BASOSABS 0.1 07/29/2021 1522   BASOSABS 0.1 10/28/2014 1755    CMP Latest Ref Rng & Units 10/21/2021 10/20/2021 10/19/2021  Glucose 70 - 99 mg/dL 107(H) 135(H) 100(H)  BUN 8 - 23 mg/dL 59(H) 67(H) 57(H)  Creatinine 0.44 - 1.00 mg/dL 3.90(H) 3.78(H) 2.96(H)  Sodium 135 - 145 mmol/L 134(L) 133(L) 134(L)  Potassium 3.5 - 5.1 mmol/L 3.4(L) 4.2 3.6  Chloride 98 - 111 mmol/L 105 103 103  CO2 22 - 32 mmol/L 21(L) 20(L) 20(L)  Calcium 8.9 - 10.3 mg/dL 8.2(L) 8.1(L) 7.6(L)  Total Protein 6.5 - 8.1 g/dL - - 5.5(L)  Total Bilirubin 0.3 - 1.2 mg/dL - - 0.8  Alkaline Phos 38 - 126 U/L - - 69  AST 15 - 41 U/L - - 380(H)  ALT 0 - 44 U/L - - 135(H)      Microbiology: Recent Results (from the past 240 hour(s))  Culture, blood (routine x 2)     Status: Abnormal   Collection Time: 10/18/21  9:37 AM   Specimen: BLOOD  Result Value Ref Range Status   Specimen Description   Final    BLOOD  RFOA Performed at [facility], [address] [place], [place] [address]    Special Requests   Final    BOTTLES DRAWN AEROBIC AND ANAEROBIC  BCAV Performed at [facility], [address], [place], [place] [address]    Culture  Setup Time   Final    GRAM NEGATIVE RODS IN BOTH AEROBIC AND ANAEROBIC BOTTLES CRITICAL VALUE NOTED.  VALUE IS CONSISTENT WITH PREVIOUSLY REPORTED AND CALLED VALUE. Performed at [facility], [address] [place], [place] [address]    Culture (A)  Final    PROTEUS MIRABILIS SUSCEPTIBILITIES PERFORMED ON PREVIOUS CULTURE WITHIN THE LAST 5 DAYS. Performed at [facility], [place] [address], [place], [place] [address]    Report Status 10/21/2021 FINAL  Final  Culture, blood (routine x 2)     Status: Abnormal   Collection Time: 10/18/21  9:37 AM   Specimen: BLOOD  Result Value Ref Range Status   Specimen Description   Final    BLOOD  LAC Performed at [facility], [address], [place], [place] [address]    Special Requests   Final    BOTTLES DRAWN AEROBIC AND ANAEROBIC Blood Culture adequate volume Performed at [facility], [place] [address], [place], [place] [address]    Culture  Setup Time   Final    Organism ID to follow GRAM NEGATIVE RODS IN BOTH AEROBIC AND ANAEROBIC BOTTLES CRITICAL RESULT CALLED TO, READ BACK BY AND VERIFIED WITH: Lillia Abed @0031  ON 10/19/21 SKL Performed at [facility], [address] [place],  [address]    Culture PROTEUS MIRABILIS (A)  Final   Report Status 10/21/2021 FINAL  Final   Organism ID, Bacteria PROTEUS MIRABILIS  Final      Susceptibility   Proteus mirabilis - MIC*    AMPICILLIN <=2 SENSITIVE Sensitive     CEFAZOLIN <=4 SENSITIVE Sensitive     CEFEPIME <=0.12 SENSITIVE Sensitive     CEFTAZIDIME <=1 SENSITIVE Sensitive     CEFTRIAXONE <=0.25 SENSITIVE Sensitive     CIPROFLOXACIN <=0.25 SENSITIVE Sensitive     GENTAMICIN <=1 SENSITIVE Sensitive     IMIPENEM 2 SENSITIVE Sensitive  TRIMETH/SULFA <=20 SENSITIVE Sensitive     AMPICILLIN/SULBACTAM <=2 SENSITIVE Sensitive     PIP/TAZO <=4 SENSITIVE Sensitive     * PROTEUS MIRABILIS  Blood  Culture ID Panel (Reflexed)     Status: Abnormal   Collection Time: 10/18/21  9:37 AM  Result Value Ref Range Status   Enterococcus faecalis NOT DETECTED NOT DETECTED Final   Enterococcus Faecium NOT DETECTED NOT DETECTED Final   Listeria monocytogenes NOT DETECTED NOT DETECTED Final   Staphylococcus species NOT DETECTED NOT DETECTED Final   Staphylococcus aureus (BCID) NOT DETECTED NOT DETECTED Final   Staphylococcus epidermidis NOT DETECTED NOT DETECTED Final   Staphylococcus lugdunensis NOT DETECTED NOT DETECTED Final   Streptococcus species NOT DETECTED NOT DETECTED Final   Streptococcus agalactiae NOT DETECTED NOT DETECTED Final   Streptococcus pneumoniae NOT DETECTED NOT DETECTED Final   Streptococcus pyogenes NOT DETECTED NOT DETECTED Final   A.calcoaceticus-baumannii NOT DETECTED NOT DETECTED Final   Bacteroides fragilis NOT DETECTED NOT DETECTED Final   Enterobacterales DETECTED (A) NOT DETECTED Final    Comment: Enterobacterales represent a large order of gram negative bacteria, not a single organism. CRITICAL RESULT CALLED TO, READ BACK BY AND VERIFIED WITH: Lillia Abed @0031  ON 10/19/21 SKL    Enterobacter cloacae complex NOT DETECTED NOT DETECTED Final   Escherichia coli NOT DETECTED NOT DETECTED Final   Klebsiella aerogenes NOT DETECTED NOT DETECTED Final   Klebsiella oxytoca NOT DETECTED NOT DETECTED Final   Klebsiella pneumoniae NOT DETECTED NOT DETECTED Final   Proteus species DETECTED (A) NOT DETECTED Final    Comment: CRITICAL RESULT CALLED TO, READ BACK BY AND VERIFIED WITH: Lillia Abed @0031  ON 10/19/21 SKL    Salmonella species NOT DETECTED NOT DETECTED Final   Serratia marcescens NOT DETECTED NOT DETECTED Final   Haemophilus influenzae NOT DETECTED NOT DETECTED Final   Neisseria meningitidis NOT DETECTED NOT DETECTED Final   Pseudomonas aeruginosa NOT DETECTED NOT DETECTED Final   Stenotrophomonas maltophilia NOT DETECTED NOT DETECTED Final   Candida  albicans NOT DETECTED NOT DETECTED Final   Candida auris NOT DETECTED NOT DETECTED Final   Candida glabrata NOT DETECTED NOT DETECTED Final   Candida krusei NOT DETECTED NOT DETECTED Final   Candida parapsilosis NOT DETECTED NOT DETECTED Final   Candida tropicalis NOT DETECTED NOT DETECTED Final   Cryptococcus neoformans/gattii NOT DETECTED NOT DETECTED Final   CTX-M ESBL NOT DETECTED NOT DETECTED Final   Carbapenem resistance IMP NOT DETECTED NOT DETECTED Final   Carbapenem resistance KPC NOT DETECTED NOT DETECTED Final   Carbapenem resistance NDM NOT DETECTED NOT DETECTED Final   Carbapenem resist OXA 48 LIKE NOT DETECTED NOT DETECTED Final   Carbapenem resistance VIM NOT DETECTED NOT DETECTED Final    Comment: Performed at [facility], [address] [place], [place] [address]  Resp Panel by RT-PCR (Flu A&B, Covid) Nasopharyngeal Swab     Status: None   Collection Time: 10/18/21 11:15 AM   Specimen: Nasopharyngeal Swab; Nasopharyngeal(NP) swabs in vial transport medium  Result Value Ref Range Status   SARS Coronavirus 2 by RT PCR NEGATIVE NEGATIVE Final    Comment: (NOTE) SARS-CoV-2 target nucleic acids are NOT DETECTED.  The SARS-CoV-2 RNA is generally detectable in upper respiratory specimens during the acute phase of infection. The lowest concentration of SARS-CoV-2 viral copies this assay can detect is 138 copies/mL. A negative result does not preclude SARS-Cov-2 infection and should not be used as the sole basis for treatment or other patient  management decisions. A negative result may occur with  improper specimen collection/handling, submission of specimen other than nasopharyngeal swab, presence of viral mutation(s) within the areas targeted by this assay, and inadequate number of viral copies(<138 copies/mL). A negative result must be combined with clinical observations, patient history, and epidemiological information. The expected result is Negative.  Fact  Sheet for Patients:  EntrepreneurPulse.com.au  Fact Sheet for Healthcare Providers:  IncredibleEmployment.be  This test is no t yet approved or cleared by the [place] FDA and  has been authorized for detection and/or diagnosis of SARS-CoV-2 by FDA under an Emergency Use Authorization (EUA). This EUA will remain  in effect (meaning this test can be used) for the duration of the COVID-19 declaration under Section 564(b)(1) of the Act, 21 U.S.C.section 360bbb-3(b)(1), unless the authorization is terminated  or revoked sooner.       Influenza A by PCR NEGATIVE NEGATIVE Final   Influenza B by PCR NEGATIVE NEGATIVE Final    Comment: (NOTE) The Xpert Xpress SARS-CoV-2/FLU/RSV plus assay is intended as an aid in the diagnosis of influenza from Nasopharyngeal swab specimens and should not be used as a sole basis for treatment. Nasal washings and aspirates are unacceptable for Xpert Xpress SARS-CoV-2/FLU/RSV testing.  Fact Sheet for Patients: EntrepreneurPulse.com.au  Fact Sheet for Healthcare Providers: IncredibleEmployment.be  This test is not yet approved or cleared by the [place] FDA and has been authorized for detection and/or diagnosis of SARS-CoV-2 by FDA under an Emergency Use Authorization (EUA). This EUA will remain in effect (meaning this test can be used) for the duration of the COVID-19 declaration under Section 564(b)(1) of the Act, 21 U.S.C. section 360bbb-3(b)(1), unless the authorization is terminated or revoked.  Performed at [facility], [address] [place], [place] [address]   MRSA Next Gen by PCR, Nasal     Status: None   Collection Time: 10/18/21 12:56 PM   Specimen: Nasal Mucosa; Nasal Swab  Result Value Ref Range Status   MRSA by PCR Next Gen NOT DETECTED NOT DETECTED Final    Comment: (NOTE) The GeneXpert MRSA Assay (FDA approved for NASAL specimens  only), is one component of a comprehensive MRSA colonization surveillance program. It is not intended to diagnose MRSA infection nor to guide or monitor treatment for MRSA infections. Test performance is not FDA approved in patients less than 45 years old. Performed at [facility], [address] [place], [place] [address]   Urine Culture     Status: None   Collection Time: 10/19/21 11:13 AM   Specimen: Urine, Random  Result Value Ref Range Status   Specimen Description   Final    URINE, RANDOM Performed at [facility], [address], [place], [place] [address]    Special Requests   Final    Normal Performed at [facility], [address] [place], [place] [address]    Culture   Final    NO GROWTH Performed at [facility], [place] [address], [place], [place] [address]    Report Status 10/20/2021 FINAL  Final    IMAGING RESULTS:  I have personally reviewed the films ?Asymmetrically inflamed left kidney. But mild if any associated left hydronephrosis and hydroureter, and no obstructing calculus or lesion identified. Consider Left Pyelonephritis and/or Ascending UTI. CT Abdomen with IV contrast would best evaluate for pyelonephritis.  2. Large 8.5 cm cystic mass in the left hemipelvis appears to be ovarian in origin. Simple-appearing cyst, but not  adequately characterized on this noncontrast CT  Impression/Recommendation ? Acute encephalopathy- metabolic- has much improved  Proteus bacteremia with complicated UTI  left pyelonephritis as per imaging Also has renal calculi left kidney. No obstruction but ? Mild hydronephrosis Has a left 9 cm adnexal cyst- is this causing any amount of pressure on the bladder or ureter? ? Pelvic ultrasound Rt knee TKA- does not look infected even though pain could be from falling and being in bed. Some swelling- observe closely for any worsening and need for aspiration Continue ceftriaxone- the proteus is  pan sensitive and may change to cefazolin in a day or so  Urinary incontinence - followed by urologist and has bladder stimulator Check post void bladder scan  Fall with rhabdomyolysis  AKI - ?secondary to Infection, rhabdomyolysis worsening  Htn on amlodipine  Depression disorder On buspar, celexa, trazadone and lorezapam PRN for anxiety ___________________________________________________ Discussed with patient, and her son and requesting provider Note:  This document was prepared using Dragon voice recognition software and may include unintentional dictation errors.

## 2021-10-21 NOTE — Progress Notes (Signed)
Subjective:  Patient seen in her hospital room.  She is up out of bed to a chair eating lunch.  Patient states her knee feels better today.  She states she was able to get up and move around in the room today without any significant knee pain.  She states her knee feels better when she is up moving around.    Objective:   VITALS:   Vitals:   10/21/21 0043 10/21/21 0448 10/21/21 0757 10/21/21 1143  BP: (!) 161/78 (!) 165/85 (!) 158/80 (!) 155/69  Pulse: 94 95 88 82  Resp: 18 18 18 15   Temp: 98.1 F (36.7 C) 98.7 F (37.1 C) 98.9 F (37.2 C) 98.1 F (36.7 C)  TempSrc:   Oral   SpO2: 93% 97% 98% 95%  Weight:      Height:        PHYSICAL EXAM: Right lower extremity: Patient's right total knee incision remains well-healed.  There is no erythema ecchymosis or effusion.  Patient has mild swelling and 2 small areas of faint erythema.  She is neurovascular intact and tolerates better the range of motion today.   LABS  Results for orders placed or performed during the hospital encounter of 10/18/21 (from the past 24 hour(s))  Basic metabolic panel     Status: Abnormal   Collection Time: 10/21/21  7:15 AM  Result Value Ref Range   Sodium 134 (L) 135 - 145 mmol/L   Potassium 3.4 (L) 3.5 - 5.1 mmol/L   Chloride 105 98 - 111 mmol/L   CO2 21 (L) 22 - 32 mmol/L   Glucose, Bld 107 (H) 70 - 99 mg/dL   BUN 59 (H) 8 - 23 mg/dL   Creatinine, Ser 3.90 (H) 0.44 - 1.00 mg/dL   Calcium 8.2 (L) 8.9 - 10.3 mg/dL   GFR, Estimated 11 (L) >60 mL/min   Anion gap 8 5 - 15  CK     Status: Abnormal   Collection Time: 10/21/21  7:15 AM  Result Value Ref Range   Total CK 874 (H) 38 - 234 U/L    US RENAL  Result Date: 10/19/2021 CLINICAL DATA:  Acute kidney injury. EXAM: RENAL / URINARY TRACT ULTRASOUND COMPLETE COMPARISON:  CT abdomen and pelvis 10/18/2021 FINDINGS: Right Kidney: Renal measurements: 12.1 x 5.9 x 5.8 cm = volume: 219 mL. Echogenicity within normal limits. No mass or hydronephrosis  visualized. Left Kidney: Renal measurements: 11.7 x 5.7 x 5.4 cm = volume: 188 mL. Echogenicity within normal limits. No mass or hydronephrosis visualized. 7 mm shadowing stone in the lower pole. Bladder: Appears normal for degree of bladder distention. Other: None. IMPRESSION: 7 mm left renal calculus. No hydronephrosis. Electronically Signed   By: Logan Bores M.D.   On: 10/19/2021 13:45    Assessment/Plan:     Principal Problem:   Sepsis due to Proteus species ([place]) Active Problems:   Anemia, iron deficiency   Weakness   Benign essential tremor   Anxiety, generalized   HLD (hyperlipidemia)   Depression, major, recurrent, in partial remission (HCC)   B12 deficiency   Mild cognitive impairment with memory loss   Postural dizziness with presyncope   S/P TKR (total knee replacement) using cement, right   Rhabdomyolysis   Acute kidney injury (HCC)   Leukocytosis   Acute pain of right knee   Elevated LFTs  Patient's knee continues to improve.  There is no clinical evidence on exam of a septic right knee joint.  Patient feels better  when she is up moving around which is not consistent with a septic knee joint.  Patient may continue physical therapy.  She may weight-bear as tolerated on the right lower extremity.  Continue IV antibiotics per the hospitalist service for treatment of urosepsis.    Thornton Park , MD 10/21/2021, 12:45 PM

## 2021-10-21 NOTE — TOC Initial Note (Signed)
Transition of Care [facility]) - Initial/Assessment Note    Patient Details  Name: Nancy Blackburn MRN: [id number] Date of Birth: 03/30/1946  [facility] CM/SW Contact:    Pete Pelt, RN Phone Number: 10/21/2021, 1:47 PM  Clinical Narrative:    Patient lives at home with grandson, has no concerns about returning home after discharge.  Patient either drives herself to appointments and to the pharmacy or has friends/family to assist.  Patient uses a pill box and a note pad to assist her with taking medications as directed.  Patient has a walker, bsc and shower chair at home, does not feel she requires further DME at this time.  Recommendation is for Home Health PT.  Patient states she attended PT at [facility] after her knee and would rather have outpatient PT at their facility.  [facility] contacted for PT.     TOC contact information provided, TOC to follow to discharge.               Expected Discharge Plan: [place] Barriers to Discharge: Continued Medical Work up   Patient Goals and CMS Choice        Expected Discharge Plan and Services Expected Discharge Plan: [place] In-house Referral: NA Discharge Planning Services: CM Consult, Follow-up appt scheduled ([facility] outpatient PT) Post Acute Care Choice: NA (patient has walker, shower chair and bsc) Living arrangements for the past 2 months: [place]                 DME Arranged:  (none required)         HH Arranged:  (Patient chooses outpatient PT)          Prior Living Arrangements/Services Living arrangements for the past 2 months: Single Family Home Lives with:: Adult Children Patient language and need for interpreter reviewed:: Yes (no interpreter required) Do you feel safe going back to the place where you live?: Yes      Need for Family Participation in Patient Care: Yes (Comment) Care giver support system in place?: Yes  (comment) Current home services: DME, Other (comment) (Outpatient PT) Criminal Activity/Legal Involvement Pertinent to Current Situation/Hospitalization: No - Comment as needed  Activities of Daily Living Home Assistive Devices/Equipment: Environmental consultant (specify type), Shower chair with back ADL Screening (condition at time of admission) Patient's cognitive ability adequate to safely complete daily activities?: Yes Is the patient deaf or have difficulty hearing?: No Does the patient have difficulty seeing, even when wearing glasses/contacts?: No Does the patient have difficulty concentrating, remembering, or making decisions?: No Patient able to express need for assistance with ADLs?: Yes Does the patient have difficulty dressing or bathing?: Yes Independently performs ADLs?: No Communication: Independent Dressing (OT): Independent Grooming: Independent Feeding: Independent Bathing: Independent with device (comment) Toileting: Independent In/Out Bed: Independent with device (comment) Walks in Home: Independent with device (comment) Does the patient have difficulty walking or climbing stairs?: Yes Weakness of Legs: Both Weakness of Arms/Hands: None  Permission Sought/Granted   Permission granted to share information with : Yes, Verbal Permission Granted     Permission granted to share info w AGENCY: [facility]        Emotional Assessment Appearance:: Appears stated age Attitude/Demeanor/Rapport: Engaged Affect (typically observed): Appropriate, Pleasant Orientation: : Oriented to Self, Oriented to Place, Oriented to  Time, Oriented to Situation Alcohol / Substance Use: Not Applicable Psych Involvement: No (comment)  Admission diagnosis:  Acute kidney injury ([place]) [  N17.9] Severe sepsis with acute organ dysfunction (HCC) [A41.9, R65.20] Non-traumatic rhabdomyolysis [M62.82] Altered mental status, unspecified altered mental status type [R41.82] Sepsis with acute renal failure  without septic shock, due to unspecified organism, unspecified acute renal failure type ([place]) [A41.9, R65.20, N17.9] Patient Active Problem List   Diagnosis Date Noted   Acute pain of right knee    Elevated LFTs    Acute metabolic encephalopathy    Sepsis due to Proteus species ([place]) 10/18/2021   Rhabdomyolysis 10/18/2021   Acute kidney injury ([place]) 10/18/2021   Leukocytosis 10/18/2021   Asthma 07/02/2021   S/P TKR (total knee replacement) using cement, right 03/09/2021   Bradycardia 07/03/2020   Postural dizziness with presyncope 07/01/2020   Family history of colon cancer in mother 02/22/2018   Cervical spondylosis 05/04/2017   Cervical radiculopathy 05/04/2017   Family history of [place] disease 01/12/2017   B12 deficiency 12/18/2016   Mild cognitive impairment with memory loss 12/18/2016   Parkinsonian features 12/18/2016   Vitamin D deficiency 12/18/2016   Chalazion, bilateral 06/08/2016   Absolute anemia 07/30/2015   Edema extremities 07/30/2015   Benign essential tremor 07/30/2015   Anxiety, generalized 07/30/2015   Gastro-esophageal reflux disease without esophagitis 07/30/2015   HLD (hyperlipidemia) 07/30/2015   Cannot sleep 07/30/2015   Lumbar radiculopathy 07/30/2015   Depression, major, recurrent, in partial remission ([place]) 07/30/2015   Arthritis, degenerative 07/30/2015   Allergic rhinitis 07/30/2015   Gastroduodenal ulcer 07/30/2015   Calcium blood increased 07/30/2015   Pre-syncope 05/17/2015   Dyspnea 03/31/2015   Bleeding stomach ulcer 10/14/2013   Anemia, iron deficiency 10/14/2013   Weakness 10/14/2013   Ejection fraction    Pericardial effusion    Palpitations    Low back pain    Normal nuclear stress test    PCP:  Jerrol Banana., MD Pharmacy:   [facility] [place] [address] Phone: [phone] Fax: [facility] [place], [place] [address] Phone: [phone] Fax: [phone]  [place] [address] Phone: [phone] Fax: [phone]     Social Determinants of Health (SDOH) Interventions    Readmission Risk Interventions No flowsheet data found.

## 2021-10-21 NOTE — Progress Notes (Signed)
[facility]  ROUNDING NOTE   Subjective:   Patient seen sitting up in chair, family friend at bedside Tolerating meals without nausea vomiting States she feels much better today.  Creatinine continues to increase but appears to be plateauing.  Objective:  Vital signs in last 24 hours:  Temp:  [98.1 F (36.7 C)-98.9 F (37.2 C)] 98.1 F (36.7 C) (11/10 1143) Pulse Rate:  [80-95] 82 (11/10 1143) Resp:  [15-18] 15 (11/10 1143) BP: (142-165)/(69-85) 155/69 (11/10 1143) SpO2:  [93 %-98 %] 95 % (11/10 1143)  Weight change:  Filed Weights   10/18/21 0853 10/19/21 1500  Weight: 72.6 kg 86.2 kg    Intake/Output: I/O last 3 completed shifts: In: 3454.1 [P.O.:800; I.V.:2539.8; IV Piggyback:114.3] Out: 800 [Urine:800]   Intake/Output this shift:  Total I/O In: 240 [P.O.:240] Out: -   Physical Exam: General: NAD, sitting in chair  Head: Normocephalic, atraumatic. Moist oral mucosal membranes  Eyes: Anicteric  Lungs:  Clear to auscultation, normal effort  Heart: Regular rate and rhythm  Abdomen:  Soft, nontender, nondistended  Extremities:  no peripheral edema.  Neurologic: Nonfocal, moving all four extremities  Skin: No lesions       Basic Metabolic Panel: Recent Labs  Lab 10/18/21 0903 10/18/21 1256 10/19/21 0439 10/20/21 0257 10/21/21 0715  NA 134*  --  134* 133* 134*  K 3.4*  --  3.6 4.2 3.4*  CL 101  --  103 103 105  CO2 21*  --  20* 20* 21*  GLUCOSE 171*  --  100* 135* 107*  BUN 48*  --  57* 67* 59*  CREATININE 2.18*  --  2.96* 3.78* 3.90*  CALCIUM 8.3*  --  7.6* 8.1* 8.2*  MG  --  1.9  --   --   --   PHOS  --  3.9  --   --   --      Liver Function Tests: Recent Labs  Lab 10/18/21 0903 10/19/21 0439  AST 636* 380*  ALT 162* 135*  ALKPHOS 80 69  BILITOT 0.9 0.8  PROT 6.1* 5.5*  ALBUMIN 3.1* 2.4*    No results for input(s): LIPASE, AMYLASE in the last 168 hours. Recent Labs  Lab 10/18/21 1557  AMMONIA 16     CBC: Recent  Labs  Lab 10/18/21 0903 10/19/21 0439 10/20/21 0257  WBC 12.9* 9.9 9.5  NEUTROABS  --  7.9*  --   HGB 14.3 12.5 11.6*  HCT 42.1 38.4 34.2*  MCV 88.3 91.6 89.3  PLT 131* 106* 98*     Cardiac Enzymes: Recent Labs  Lab 10/18/21 0903 10/18/21 1557 10/19/21 0439 10/20/21 0257 10/21/21 0715  CKTOTAL 14,929* 15,867* 10,710* 3,332* 874*     BNP: Invalid input(s): POCBNP  CBG: No results for input(s): GLUCAP in the last 168 hours.  Microbiology: Results for orders placed or performed during the hospital encounter of 10/18/21  Culture, blood (routine x 2)     Status: Abnormal   Collection Time: 10/18/21  9:37 AM   Specimen: BLOOD  Result Value Ref Range Status   Specimen Description   Final    BLOOD  RFOA Performed at [facility], [address] [place], [place] [address]    Special Requests   Final    BOTTLES DRAWN AEROBIC AND ANAEROBIC  BCAV Performed at [facility], [address] [place], [place] [address]    Culture  Setup Time   Final    GRAM NEGATIVE RODS IN  BOTH AEROBIC AND ANAEROBIC BOTTLES CRITICAL VALUE NOTED.  VALUE IS CONSISTENT WITH PREVIOUSLY REPORTED AND CALLED VALUE. Performed at [facility], [address] [place], [place] [address]    Culture (A)  Final    PROTEUS MIRABILIS SUSCEPTIBILITIES PERFORMED ON PREVIOUS CULTURE WITHIN THE LAST 5 DAYS. Performed at [facility], [place] [address], [place], [place] [address]    Report Status 10/21/2021 FINAL  Final  Culture, blood (routine x 2)     Status: Abnormal   Collection Time: 10/18/21  9:37 AM   Specimen: BLOOD  Result Value Ref Range Status   Specimen Description   Final    BLOOD  LAC Performed at [facility], [address] [place], [place] [address]    Special Requests   Final    BOTTLES DRAWN AEROBIC AND ANAEROBIC Blood Culture adequate volume Performed at [facility], [place] [address] [place], [place] [address]    Culture  Setup  Time   Final    Organism ID to follow GRAM NEGATIVE RODS IN BOTH AEROBIC AND ANAEROBIC BOTTLES CRITICAL RESULT CALLED TO, READ BACK BY AND VERIFIED WITH: Lillia Abed @0031  ON 10/19/21 SKL Performed at [facility], [address] [place], [place] [address]    Culture PROTEUS MIRABILIS (A)  Final   Report Status 10/21/2021 FINAL  Final   Organism ID, Bacteria PROTEUS MIRABILIS  Final      Susceptibility   Proteus mirabilis - MIC*    AMPICILLIN <=2 SENSITIVE Sensitive     CEFAZOLIN <=4 SENSITIVE Sensitive     CEFEPIME <=0.12 SENSITIVE Sensitive     CEFTAZIDIME <=1 SENSITIVE Sensitive     CEFTRIAXONE <=0.25 SENSITIVE Sensitive     CIPROFLOXACIN <=0.25 SENSITIVE Sensitive     GENTAMICIN <=1 SENSITIVE Sensitive     IMIPENEM 2 SENSITIVE Sensitive     TRIMETH/SULFA <=20 SENSITIVE Sensitive     AMPICILLIN/SULBACTAM <=2 SENSITIVE Sensitive     PIP/TAZO <=4 SENSITIVE Sensitive     * PROTEUS MIRABILIS  Blood Culture ID Panel (Reflexed)     Status: Abnormal   Collection Time: 10/18/21  9:37 AM  Result Value Ref Range Status   Enterococcus faecalis NOT DETECTED NOT DETECTED Final   Enterococcus Faecium NOT DETECTED NOT DETECTED Final   Listeria monocytogenes NOT DETECTED NOT DETECTED Final   Staphylococcus species NOT DETECTED NOT DETECTED Final   Staphylococcus aureus (BCID) NOT DETECTED NOT DETECTED Final   Staphylococcus epidermidis NOT DETECTED NOT DETECTED Final   Staphylococcus lugdunensis NOT DETECTED NOT DETECTED Final   Streptococcus species NOT DETECTED NOT DETECTED Final   Streptococcus agalactiae NOT DETECTED NOT DETECTED Final   Streptococcus pneumoniae NOT DETECTED NOT DETECTED Final   Streptococcus pyogenes NOT DETECTED NOT DETECTED Final   A.calcoaceticus-baumannii NOT DETECTED NOT DETECTED Final   Bacteroides fragilis NOT DETECTED NOT DETECTED Final   Enterobacterales DETECTED (A) NOT DETECTED Final    Comment: Enterobacterales represent a large order of gram  negative bacteria, not a single organism. CRITICAL RESULT CALLED TO, READ BACK BY AND VERIFIED WITH: Lillia Abed @0031  ON 10/19/21 SKL    Enterobacter cloacae complex NOT DETECTED NOT DETECTED Final   Escherichia coli NOT DETECTED NOT DETECTED Final   Klebsiella aerogenes NOT DETECTED NOT DETECTED Final   Klebsiella oxytoca NOT DETECTED NOT DETECTED Final   Klebsiella pneumoniae NOT DETECTED NOT DETECTED Final   Proteus species DETECTED (A) NOT DETECTED Final    Comment: CRITICAL RESULT CALLED TO, READ BACK  BY AND VERIFIED WITH: Lillia Abed @0031  ON 10/19/21 SKL    Salmonella species NOT DETECTED NOT DETECTED Final   Serratia marcescens NOT DETECTED NOT DETECTED Final   Haemophilus influenzae NOT DETECTED NOT DETECTED Final   Neisseria meningitidis NOT DETECTED NOT DETECTED Final   Pseudomonas aeruginosa NOT DETECTED NOT DETECTED Final   Stenotrophomonas maltophilia NOT DETECTED NOT DETECTED Final   Candida albicans NOT DETECTED NOT DETECTED Final   Candida auris NOT DETECTED NOT DETECTED Final   Candida glabrata NOT DETECTED NOT DETECTED Final   Candida krusei NOT DETECTED NOT DETECTED Final   Candida parapsilosis NOT DETECTED NOT DETECTED Final   Candida tropicalis NOT DETECTED NOT DETECTED Final   Cryptococcus neoformans/gattii NOT DETECTED NOT DETECTED Final   CTX-M ESBL NOT DETECTED NOT DETECTED Final   Carbapenem resistance IMP NOT DETECTED NOT DETECTED Final   Carbapenem resistance KPC NOT DETECTED NOT DETECTED Final   Carbapenem resistance NDM NOT DETECTED NOT DETECTED Final   Carbapenem resist OXA 48 LIKE NOT DETECTED NOT DETECTED Final   Carbapenem resistance VIM NOT DETECTED NOT DETECTED Final    Comment: Performed at [facility], [address] [place], [place] [address]  Resp Panel by RT-PCR (Flu A&B, Covid) Nasopharyngeal Swab     Status: None   Collection Time: 10/18/21 11:15 AM   Specimen: Nasopharyngeal Swab; Nasopharyngeal(NP) swabs in vial transport  medium  Result Value Ref Range Status   SARS Coronavirus 2 by RT PCR NEGATIVE NEGATIVE Final    Comment: (NOTE) SARS-CoV-2 target nucleic acids are NOT DETECTED.  The SARS-CoV-2 RNA is generally detectable in upper respiratory specimens during the acute phase of infection. The lowest concentration of SARS-CoV-2 viral copies this assay can detect is 138 copies/mL. A negative result does not preclude SARS-Cov-2 infection and should not be used as the sole basis for treatment or other patient management decisions. A negative result may occur with  improper specimen collection/handling, submission of specimen other than nasopharyngeal swab, presence of viral mutation(s) within the areas targeted by this assay, and inadequate number of viral copies(<138 copies/mL). A negative result must be combined with clinical observations, patient history, and epidemiological information. The expected result is Negative.  Fact Sheet for Patients:  EntrepreneurPulse.com.au  Fact Sheet for Healthcare Providers:  IncredibleEmployment.be  This test is no t yet approved or cleared by the [place] FDA and  has been authorized for detection and/or diagnosis of SARS-CoV-2 by FDA under an Emergency Use Authorization (EUA). This EUA will remain  in effect (meaning this test can be used) for the duration of the COVID-19 declaration under Section 564(b)(1) of the Act, 21 U.S.C.section 360bbb-3(b)(1), unless the authorization is terminated  or revoked sooner.       Influenza A by PCR NEGATIVE NEGATIVE Final   Influenza B by PCR NEGATIVE NEGATIVE Final    Comment: (NOTE) The Xpert Xpress SARS-CoV-2/FLU/RSV plus assay is intended as an aid in the diagnosis of influenza from Nasopharyngeal swab specimens and should not be used as a sole basis for treatment. Nasal washings and aspirates are unacceptable for Xpert Xpress SARS-CoV-2/FLU/RSV testing.  Fact Sheet for  Patients: EntrepreneurPulse.com.au  Fact Sheet for Healthcare Providers: IncredibleEmployment.be  This test is not yet approved or cleared by the [place] FDA and has been authorized for detection and/or diagnosis of SARS-CoV-2 by FDA under an Emergency Use Authorization (EUA). This EUA will remain in effect (meaning this test can be used) for the duration of the COVID-19 declaration under  Section 564(b)(1) of the Act, 21 U.S.C. section 360bbb-3(b)(1), unless the authorization is terminated or revoked.  Performed at [facility], [address] [place], [place] [address]   MRSA Next Gen by PCR, Nasal     Status: None   Collection Time: 10/18/21 12:56 PM   Specimen: Nasal Mucosa; Nasal Swab  Result Value Ref Range Status   MRSA by PCR Next Gen NOT DETECTED NOT DETECTED Final    Comment: (NOTE) The GeneXpert MRSA Assay (FDA approved for NASAL specimens only), is one component of a comprehensive MRSA colonization surveillance program. It is not intended to diagnose MRSA infection nor to guide or monitor treatment for MRSA infections. Test performance is not FDA approved in patients less than 14 years old. Performed at [facility], [address] [place], [place] [address]   Urine Culture     Status: None   Collection Time: 10/19/21 11:13 AM   Specimen: Urine, Random  Result Value Ref Range Status   Specimen Description   Final    URINE, RANDOM Performed at [facility], [address] [place], [place] [address]    Special Requests   Final    Normal Performed at [facility], [address] [place], [place] [address]    Culture   Final    NO GROWTH Performed at [facility], [place] [address], [place], [place] [address]    Report Status 10/20/2021 FINAL  Final    Coagulation Studies: Recent Labs    10/19/21 0439  LABPROT 12.9  INR 1.0     Urinalysis: Recent Labs     10/19/21 1113  COLORURINE AMBER*  LABSPEC 1.015  PHURINE 5.0  GLUCOSEU 50*  HGBUR LARGE*  BILIRUBINUR NEGATIVE  KETONESUR NEGATIVE  PROTEINUR 100*  NITRITE NEGATIVE  LEUKOCYTESUR LARGE*       Imaging: US RENAL  Result Date: 10/19/2021 CLINICAL DATA:  Acute kidney injury. EXAM: RENAL / URINARY TRACT ULTRASOUND COMPLETE COMPARISON:  CT abdomen and pelvis 10/18/2021 FINDINGS: Right Kidney: Renal measurements: 12.1 x 5.9 x 5.8 cm = volume: 219 mL. Echogenicity within normal limits. No mass or hydronephrosis visualized. Left Kidney: Renal measurements: 11.7 x 5.7 x 5.4 cm = volume: 188 mL. Echogenicity within normal limits. No mass or hydronephrosis visualized. 7 mm shadowing stone in the lower pole. Bladder: Appears normal for degree of bladder distention. Other: None. IMPRESSION: 7 mm left renal calculus. No hydronephrosis. Electronically Signed   By: Logan Bores M.D.   On: 10/19/2021 13:45     Medications:    cefTRIAXone (ROCEPHIN)  IV 2 g (10/21/21 1220)   lactated ringers 75 mL/hr at 10/20/21 0531    amLODipine  2.5 mg Oral Daily   busPIRone  10 mg Oral BID   cholecalciferol  1,000 Units Oral Daily   citalopram  40 mg Oral Daily   fluticasone furoate-vilanterol  1 puff Inhalation Daily   And   umeclidinium bromide  1 puff Inhalation Daily   heparin  5,000 Units Subcutaneous Q8H   lidocaine  1 patch Transdermal Q24H   loratadine  10 mg Oral Daily   traZODone  100 mg Oral QHS   vitamin B-12  1,000 mcg Oral Daily   acetaminophen **OR** acetaminophen, albuterol, LORazepam, ondansetron **OR** ondansetron (ZOFRAN) IV, oxyCODONE  Assessment/ Plan:  [name] is a 75 y.o.  female with past medical conditions including hypertension, hyperlipidemia, anxiety and depression, pulmonary hypertension, and asthma, who was admitted to [facility] on  10/18/2021 for Acute kidney injury ([place]) [N17.9] Severe sepsis with acute organ dysfunction ([place]) [A41.9, R65.20] Non-traumatic  rhabdomyolysis [M62.82] Altered mental status, unspecified altered mental status type [R41.82] Sepsis with acute renal failure without septic shock, due to unspecified organism, unspecified acute renal failure type ([place]) [A41.9, R65.20, N17.9]   Acute Kidney Injury with hematuria and proteinuria. Baseline creatinine 0.84 on 07/29/21.  Acute kidney injury secondary to rhabdomyolysis Renal ultrasound negative for obstruction, but positive for a 7 mm left renal calculus.  No IV contrast exposure.  No indication for dialysis at this time.  SPEP, UPEP, ANCA, ANA pending. Complements are normal.  Creatinine slightly increased from yesterday which is still not uncommon.  Expect creatinine to begin to plateau possibly tomorrow.  CK greatly decreased today.  Continue IV fluids and patient encouraged to maintain oral intake.  No acute indication for dialysis at this time but will monitor closely.  Lab Results  Component Value Date   CREATININE 3.90 (H) 10/21/2021   CREATININE 3.78 (H) 10/20/2021   CREATININE 2.96 (H) 10/19/2021    Intake/Output Summary (Last 24 hours) at 10/21/2021 1225 Last data filed at 10/21/2021 1017 Gross per 24 hour  Intake 2347.58 ml  Output 500 ml  Net 1847.58 ml    2. Hypertension Home regimen includes Amlodipine, currently prescribed and patient.  BP 155/69   3. Hyponatremia likely due to kidney injury.  Slightly improved to 134  4. Severe Sepsis with hypotension and acute metabolic encephalopathy. Blood cultures growing proteus.  Receiving Rocephin  Awaiting ID consult   LOS: 3   11/10/202212:25 PM

## 2021-10-21 NOTE — Progress Notes (Signed)
Patient ID: Nancy [place], female   DOB: [birth year], 75 y.o.   MRN: [id number] [facility] PROGRESS NOTE  [place] PAIZ RWE:[id number] DOB: 1946/02/27 DOA: 10/18/2021 PCP: Jerrol Banana., MD  HPI/Subjective: Patient feeling better than when she came in.  Still little stiff and still having pain in her right knee.  Appetite okay.  Urinating well.  Objective: Vitals:   10/21/21 0757 10/21/21 1143  BP: (!) 158/80 (!) 155/69  Pulse: 88 82  Resp: 18 15  Temp: 98.9 F (37.2 C) 98.1 F (36.7 C)  SpO2: 98% 95%    Intake/Output Summary (Last 24 hours) at 10/21/2021 1459 Last data filed at 10/21/2021 1412 Gross per 24 hour  Intake 2347.58 ml  Output 500 ml  Net 1847.58 ml   Filed Weights   10/18/21 0853 10/19/21 1500  Weight: 72.6 kg 86.2 kg    ROS: Review of Systems  Respiratory:  Negative for cough and shortness of breath.   Cardiovascular:  Negative for chest pain.  Gastrointestinal:  Negative for abdominal pain, nausea and vomiting.  Musculoskeletal:  Positive for joint pain.  Exam: Physical Exam HENT:     Head: Normocephalic.     Mouth/Throat:     Pharynx: No oropharyngeal exudate.  Eyes:     General: Lids are normal.     Conjunctiva/sclera: Conjunctivae normal.  Cardiovascular:     Rate and Rhythm: Normal rate and regular rhythm.     Heart sounds: Normal heart sounds, S1 normal and S2 normal.  Pulmonary:     Breath sounds: No decreased breath sounds, wheezing, rhonchi or rales.  Abdominal:     Palpations: Abdomen is soft.     Tenderness: There is no abdominal tenderness.  Musculoskeletal:     Right knee: Decreased range of motion.     Right ankle: No swelling.     Left ankle: No swelling.  Skin:    General: Skin is warm.     Findings: No rash.  Neurological:     Mental Status: She is alert and oriented to person, place, and time.      Scheduled Meds:  amLODipine  2.5 mg Oral Daily   busPIRone  10 mg Oral BID    cholecalciferol  1,000 Units Oral Daily   citalopram  40 mg Oral Daily   fluticasone furoate-vilanterol  1 puff Inhalation Daily   And   umeclidinium bromide  1 puff Inhalation Daily   heparin  5,000 Units Subcutaneous Q8H   lidocaine  1 patch Transdermal Q24H   loratadine  10 mg Oral Daily   traZODone  100 mg Oral QHS   vitamin B-12  1,000 mcg Oral Daily   Continuous Infusions:  cefTRIAXone (ROCEPHIN)  IV 2 g (10/21/21 1220)   lactated ringers 75 mL/hr at 10/20/21 0531    Assessment/Plan:  Acute kidney injury.  Continue IV fluid hydration.  Creatinine worse again today at 3.9.  Likely secondary to rhabdomyolysis and initial hypotension with sepsis.  Need to see creatinine peak and started to improve prior to any disposition. Severe sepsis, present on admission with acute kidney injury, hypotension, acute metabolic encephalopathy.  Proteus growing out of the blood cultures.  Currently on Rocephin.  Initially patient had tachypnea, tachycardia and leukocytosis.  Initially I thought this was from a urine source but urine culture came back negative but this was drawn the day after admission with a straight cath.  Infectious disease consultation. Right knee pain with history of right knee replacement.  Likely from fall and rhabdomyolysis.  Case discussed with orthopedic surgery and they do not believe that this is a septic joint. Acute metabolic encephalopathy from sepsis.  This has resolved. Rhabdomyolysis.  CPK trending better from 15,867 down to 874 today.  Continue IV fluids Elevated liver function test secondary to sepsis Hyperlipidemia unspecified.  Continue to hold Crestor at this time with rhabdomyolysis Elevated troponin secondary to rhabdomyolysis Asthma.  Continue inhaler Parkinson's disease Weakness.  Physical therapy recommends home with home health        Code Status:     Code Status Orders  (From admission, onward)           Start     Ordered   10/18/21 1220   Full code  Continuous        10/18/21 1223           Code Status History     Date Active Date Inactive Code Status Order ID Comments User Context   03/09/2021 1232 03/12/2021 2121 Full Code [id number]  Thornton Park, MD Inpatient   07/01/2020 2328 07/03/2020 2053 Full Code [id number]  Mansy, Arvella Merles, MD ED      Advance Directive Documentation    Flowsheet Row Most Recent Value  Type of Advance Directive Healthcare Power of Attorney, Living will  Pre-existing out of facility DNR order (yellow form or pink MOST form) --  "MOST" Form in Place? --      Family Communication: Family at bedside this morning Disposition Plan: Status is: Inpatient  Consultants: Nephrology Orthopedic surgery Infectious disease  Antibiotics: Rocephin  Time spent: 26 minutes  [place]

## 2021-10-22 ENCOUNTER — Inpatient Hospital Stay: Payer: Medicare Other

## 2021-10-22 DIAGNOSIS — G9341 Metabolic encephalopathy: Secondary | ICD-10-CM | POA: Diagnosis not present

## 2021-10-22 DIAGNOSIS — R7881 Bacteremia: Secondary | ICD-10-CM | POA: Diagnosis not present

## 2021-10-22 DIAGNOSIS — A419 Sepsis, unspecified organism: Secondary | ICD-10-CM

## 2021-10-22 DIAGNOSIS — N179 Acute kidney failure, unspecified: Secondary | ICD-10-CM | POA: Diagnosis not present

## 2021-10-22 DIAGNOSIS — N12 Tubulo-interstitial nephritis, not specified as acute or chronic: Secondary | ICD-10-CM

## 2021-10-22 DIAGNOSIS — R6520 Severe sepsis without septic shock: Secondary | ICD-10-CM

## 2021-10-22 DIAGNOSIS — A4159 Other Gram-negative sepsis: Secondary | ICD-10-CM | POA: Diagnosis not present

## 2021-10-22 DIAGNOSIS — M25561 Pain in right knee: Secondary | ICD-10-CM | POA: Diagnosis not present

## 2021-10-22 LAB — BASIC METABOLIC PANEL
Anion gap: 10 (ref 5–15)
BUN: 54 mg/dL — ABNORMAL HIGH (ref 8–23)
CO2: 21 mmol/L — ABNORMAL LOW (ref 22–32)
Calcium: 8.6 mg/dL — ABNORMAL LOW (ref 8.9–10.3)
Chloride: 106 mmol/L (ref 98–111)
Creatinine, Ser: 3.48 mg/dL — ABNORMAL HIGH (ref 0.44–1.00)
GFR, Estimated: 13 mL/min — ABNORMAL LOW (ref >60)
Glucose, Bld: 98 mg/dL (ref 70–99)
Potassium: 4 mmol/L (ref 3.5–5.1)
Sodium: 137 mmol/L (ref 135–145)

## 2021-10-22 LAB — PROTEIN ELECTRO, RANDOM URINE
Albumin ELP, Urine: 52.3 %
Alpha-1-Globulin, U: 1.4 %
Alpha-2-Globulin, U: 8.3 %
Beta Globulin, U: 21.4 %
Gamma Globulin, U: 16.6 %
Total Protein, Urine: 154.5 mg/dL

## 2021-10-22 LAB — CK: Total CK: 613 U/L — ABNORMAL HIGH (ref 38–234)

## 2021-10-22 MED ORDER — AMLODIPINE BESYLATE 5 MG PO TABS
5.0000 mg | ORAL_TABLET | Freq: Once | ORAL | Status: AC
  Administered 2021-10-22: 17:00:00 5 mg via ORAL
  Filled 2021-10-22: qty 1

## 2021-10-22 MED ORDER — CEFAZOLIN SODIUM-DEXTROSE 1-4 GM/50ML-% IV SOLN
1.0000 g | Freq: Two times a day (BID) | INTRAVENOUS | Status: AC
  Administered 2021-10-23 – 2021-10-25 (×5): 1 g via INTRAVENOUS
  Filled 2021-10-22 (×6): qty 50

## 2021-10-22 MED ORDER — AMLODIPINE BESYLATE 10 MG PO TABS
10.0000 mg | ORAL_TABLET | Freq: Every day | ORAL | Status: DC
  Administered 2021-10-23 – 2021-10-26 (×4): 10 mg via ORAL
  Filled 2021-10-22 (×4): qty 1

## 2021-10-22 MED ORDER — AMLODIPINE BESYLATE 5 MG PO TABS
5.0000 mg | ORAL_TABLET | Freq: Every day | ORAL | Status: DC
  Administered 2021-10-22: 08:00:00 5 mg via ORAL
  Filled 2021-10-22: qty 1

## 2021-10-22 NOTE — Progress Notes (Signed)
PT Cancellation Note  Patient Details Name: Nancy [place] MRN: [id number] DOB: [birth year]   Cancelled Treatment:     PT attempt. Pt leaving floor for [place]. Will return later this date and continue to follow per current POC.    Willette Pa 10/22/2021, 11:13 AM

## 2021-10-22 NOTE — Progress Notes (Addendum)
Patient ID: Nancy Blackburn, female   DOB: 07/01/1946, 75 y.o.   MRN: [id number] [facility] PROGRESS NOTE  VIANA SLEEP BZJ:[id number] DOB: 05/05/1946 DOA: 10/18/2021 PCP: Jerrol Banana., MD  HPI/Subjective: Patient still has some right knee pain.  Urinating well.  Breathing okay.  Admitted with severe Proteus sepsis.  Objective: Vitals:   10/22/21 1223 10/22/21 1550  BP: (!) 161/67 (!) 171/62  Pulse: 94 94  Resp: 17 18  Temp: 99.5 F (37.5 C) 97.7 F (36.5 C)  SpO2: 95% 97%    Intake/Output Summary (Last 24 hours) at 10/22/2021 1553 Last data filed at 10/22/2021 1020 Gross per 24 hour  Intake 1779 ml  Output 650 ml  Net 1129 ml   Filed Weights   10/18/21 0853 10/19/21 1500  Weight: 72.6 kg 86.2 kg    ROS: Review of Systems  Respiratory:  Negative for shortness of breath.   Cardiovascular:  Negative for chest pain.  Gastrointestinal:  Negative for abdominal pain, nausea and vomiting.  Musculoskeletal:  Positive for joint pain.  Exam: Physical Exam HENT:     Head: Normocephalic.     Mouth/Throat:     Pharynx: No oropharyngeal exudate.  Eyes:     General: Lids are normal.     Conjunctiva/sclera: Conjunctivae normal.  Cardiovascular:     Rate and Rhythm: Normal rate and regular rhythm.     Heart sounds: Normal heart sounds, S1 normal and S2 normal.  Pulmonary:     Breath sounds: No decreased breath sounds, wheezing, rhonchi or rales.  Abdominal:     Palpations: Abdomen is soft.     Tenderness: There is no abdominal tenderness.  Musculoskeletal:     Right lower leg: No swelling.     Left lower leg: No swelling.  Skin:    General: Skin is warm.     Findings: No rash.  Neurological:     Mental Status: She is alert and oriented to person, place, and time.      Scheduled Meds:  amLODipine  5 mg Oral Daily   busPIRone  10 mg Oral BID   cholecalciferol  1,000 Units Oral Daily   citalopram  40 mg Oral Daily   fluticasone  furoate-vilanterol  1 puff Inhalation Daily   And   umeclidinium bromide  1 puff Inhalation Daily   heparin  5,000 Units Subcutaneous Q8H   lidocaine  1 patch Transdermal Q24H   loratadine  10 mg Oral Daily   traZODone  100 mg Oral QHS   vitamin B-12  1,000 mcg Oral Daily   Continuous Infusions:  [START ON 10/23/2021]  ceFAZolin (ANCEF) IV     lactated ringers 50 mL/hr at 10/22/21 1046    Assessment/Plan:  Acute kidney injury.  Creatinine peaked yesterday at 3.9 and has come down to 3.48 with IV fluid hydration.  This was secondary to initial rhabdomyolysis and hypotension with sepsis on presentation. Severe sepsis with Proteus growing out of blood cultures, present on admission.  Patient had acute kidney injury hypotension acute metabolic encephalopathy on presentation.  Initially had tachypnea tachycardia and leukocytosis.  Likely still urine source even though urine culture came back negative.  Switched to Ancef. Right knee pain with history of right knee replacement.  Likely from fall and rhabdomyolysis.  Seen by orthopedic surgery. Acute metabolic encephalopathy from sepsis this is resolved Acute rhabdomyolysis from fall.  CPK trended from 15 867 down to 613.  On IV fluids.  Discontinue Crestor. Elevated liver function  test secondary to sepsis Hyperlipidemia unspecified.  Continue to hold Crestor at this time with rhabdomyolysis Elevated troponin secondary to rhabdomyolysis (not cardiac) Asthma.  Continue inhaler Parkinson's disease Essential hypertension.  Increase Norvasc to 10 mg daily and give a dose of 5 now. Weakness.  Physical therapy recommends home health Left adnexa with mildly complicated cystic lesion.  Case discussed with gynecology Dr. Gilman Schmidt.  She recommended outpatient follow-up and sending off a [place] blood test.        Code Status:     Code Status Orders  (From admission, onward)           Start     Ordered   10/18/21 1220  Full code  Continuous         10/18/21 1223           Code Status History     Date Active Date Inactive Code Status Order ID Comments User Context   03/09/2021 1232 03/12/2021 2121 Full Code [id number]  Thornton Park, MD Inpatient   07/01/2020 2328 07/03/2020 2053 Full Code [id number]  Mansy, Arvella Merles, MD ED      Advance Directive Documentation    Flowsheet Row Most Recent Value  Type of Advance Directive Healthcare Power of Attorney, Living will  Pre-existing out of facility DNR order (yellow form or pink MOST form) --  "MOST" Form in Place? --      Disposition Plan: Status is: Inpatient  Consultants: Nephrology Infectious disease Orthopedic surgery  Antibiotics: Ancef  Time spent: 27 minutes.  Case discussed with gynecology and nephrology  [place]  [facility]

## 2021-10-22 NOTE — Progress Notes (Signed)
ID: Nancy Blackburn is a 75 y.o. female  Principal Problem:   Sepsis due to Proteus species ([place]) Active Problems:   Anemia, iron deficiency   Weakness   Benign essential tremor   Anxiety, generalized   HLD (hyperlipidemia)   Depression, major, recurrent, in partial remission (HCC)   B12 deficiency   Mild cognitive impairment with memory loss   Postural dizziness with presyncope   S/P TKR (total knee replacement) using cement, right   Rhabdomyolysis   Acute kidney injury (HCC)   Leukocytosis   Acute pain of right knee   Elevated LFTs    Subjective: Pt says she has generalized body ache and rt knee pain- she thinks this is due to lying in bed She is able to walk to the bathroom with a walker under observation Sits in recliner  Medications:   amLODipine  5 mg Oral Daily   busPIRone  10 mg Oral BID   cholecalciferol  1,000 Units Oral Daily   citalopram  40 mg Oral Daily   fluticasone furoate-vilanterol  1 puff Inhalation Daily   And   umeclidinium bromide  1 puff Inhalation Daily   heparin  5,000 Units Subcutaneous Q8H   lidocaine  1 patch Transdermal Q24H   loratadine  10 mg Oral Daily   traZODone  100 mg Oral QHS   vitamin B-12  1,000 mcg Oral Daily    Objective: Vital signs in last 24 hours: Temp:  [98 F (36.7 C)-98.7 F (37.1 C)] 98.2 F (36.8 C) (11/11 0801) Pulse Rate:  [82-93] 93 (11/11 0801) Resp:  [15-18] 18 (11/11 0801) BP: (155-172)/(64-81) 157/69 (11/11 0801) SpO2:  [94 %-97 %] 94 % (11/11 0801)  PHYSICAL EXAM:  General: Alert, cooperative,  Head: Normocephalic, without obvious abnormality, atraumatic. Eyes: Conjunctivae clear, anicteric sclerae. Pupils are equal ENT Nares normal. No drainage or sinus tenderness. Lips, mucosa, and tongue normal. No Thrush Neck: Supple, symmetrical, no adenopathy, thyroid: non tender no carotid bruit and no JVD. Back: No CVA tenderness. Lungs: Clear to auscultation bilaterally. No Wheezing or Rhonchi.  No rales. Heart: Regular rate and rhythm, no murmur, rub or gallop. Abdomen: Soft, non-tender,not distended. Bowel sounds normal. No masses Extremities: rt knee surgical scar- no erythema or warmth- mild swelling Flexion restricted Skin: No rashes or lesions. Or bruising Lymph: Cervical, supraclavicular normal. Neurologic: involuntary tremors  Grossly non-focal  Lab Results Recent Labs    10/20/21 0257 10/21/21 0715 10/22/21 0547  WBC 9.5  --   --   HGB 11.6*  --   --   HCT 34.2*  --   --   NA 133* 134* 137  K 4.2 3.4* 4.0  CL 103 105 106  CO2 20* 21* 21*  BUN 67* 59* 54*  CREATININE 3.78* 3.90* 3.48*     Microbiology:  Studies/Results: No results found.   Assessment/Plan: Acute encephalopathy- metabolic- has much improved   Proteus bacteremia with complicated UTI  left pyelonephritis as per imaging Also has renal calculi left kidney. No obstruction but ? Mild hydronephrosis Has a left 9 cm adnexal cyst- is this causing any amount of pressure on the bladder or ureter? Pelvic ultrasound done today Rt knee TKA- has pain which could be from falling, but no bruises or signs of external injury  Some swelling, but no erythema or tenderness or warmth- observe closely for any worsening and need for aspiration On ceftriaxone- the proteus is pan sensitive and can be changed to cefazolin. She needs antibiotic (  can be oral on discharge) until 10/28/21-   Urinary incontinence - followed by urologist and has bladder stimulator post void bladder scan no residue   Rhabdomyolysis - improving CK   AKI - ?secondary to Infection, rhabdomyolysis Improving today   Htn on amlodipine   Depression disorder On buspar, celexa, trazadone and lorezapam PRN for anxiety  ID will follow her peripherally this weekend- call if needed

## 2021-10-22 NOTE — Progress Notes (Signed)
[facility]  ROUNDING NOTE   Subjective:   Patient appears to be in good spirits today. Creatinine down to 3.48. CK also down.  Objective:  Vital signs in last 24 hours:  Temp:  [98 F (36.7 C)-98.7 F (37.1 C)] 98.2 F (36.8 C) (11/11 0801) Pulse Rate:  [82-93] 93 (11/11 0801) Resp:  [15-18] 18 (11/11 0801) BP: (155-172)/(64-81) 157/69 (11/11 0801) SpO2:  [94 %-97 %] 94 % (11/11 0801)  Weight change:  Filed Weights   10/18/21 0853 10/19/21 1500  Weight: 72.6 kg 86.2 kg    Intake/Output: I/O last 3 completed shifts: In: 3646.6 [P.O.:1020; I.V.:2512.3; IV Piggyback:114.3] Out: 950 [Urine:950]   Intake/Output this shift:  Total I/O In: -  Out: 200 [Urine:200]  Physical Exam: General: NAD, sitting in chair  Head: Normocephalic, atraumatic. Moist oral mucosal membranes  Eyes: Anicteric  Lungs:  Clear to auscultation, normal effort  Heart: Regular rate and rhythm  Abdomen:  Soft, nontender, nondistended  Extremities: No peripheral edema.  Neurologic: Nonfocal, moving all four extremities  Skin: No lesions       Basic Metabolic Panel: Recent Labs  Lab 10/18/21 0903 10/18/21 1256 10/19/21 0439 10/20/21 0257 10/21/21 0715 10/22/21 0547  NA 134*  --  134* 133* 134* 137  K 3.4*  --  3.6 4.2 3.4* 4.0  CL 101  --  103 103 105 106  CO2 21*  --  20* 20* 21* 21*  GLUCOSE 171*  --  100* 135* 107* 98  BUN 48*  --  57* 67* 59* 54*  CREATININE 2.18*  --  2.96* 3.78* 3.90* 3.48*  CALCIUM 8.3*  --  7.6* 8.1* 8.2* 8.6*  MG  --  1.9  --   --   --   --   PHOS  --  3.9  --   --   --   --      Liver Function Tests: Recent Labs  Lab 10/18/21 0903 10/19/21 0439  AST 636* 380*  ALT 162* 135*  ALKPHOS 80 69  BILITOT 0.9 0.8  PROT 6.1* 5.5*  ALBUMIN 3.1* 2.4*    No results for input(s): LIPASE, AMYLASE in the last 168 hours. Recent Labs  Lab 10/18/21 1557  AMMONIA 16     CBC: Recent Labs  Lab 10/18/21 0903 10/19/21 0439 10/20/21 0257   WBC 12.9* 9.9 9.5  NEUTROABS  --  7.9*  --   HGB 14.3 12.5 11.6*  HCT 42.1 38.4 34.2*  MCV 88.3 91.6 89.3  PLT 131* 106* 98*     Cardiac Enzymes: Recent Labs  Lab 10/18/21 1557 10/19/21 0439 10/20/21 0257 10/21/21 0715 10/22/21 0547  CKTOTAL 15,867* 10,710* 3,332* 874* 613*     BNP: Invalid input(s): POCBNP  CBG: No results for input(s): GLUCAP in the last 168 hours.  Microbiology: Results for orders placed or performed during the hospital encounter of 10/18/21  Culture, blood (routine x 2)     Status: Abnormal   Collection Time: 10/18/21  9:37 AM   Specimen: BLOOD  Result Value Ref Range Status   Specimen Description   Final    BLOOD  RFOA Performed at [facility], [address] [place], [place] [address]    Special Requests   Final    BOTTLES DRAWN AEROBIC AND ANAEROBIC  BCAV Performed at [facility], [address], [place],  [address]    Culture  Setup Time   Final    GRAM NEGATIVE RODS IN BOTH  AEROBIC AND ANAEROBIC BOTTLES CRITICAL VALUE NOTED.  VALUE IS CONSISTENT WITH PREVIOUSLY REPORTED AND CALLED VALUE. Performed at [facility], [address] [place], [place] [address]    Culture (A)  Final    PROTEUS MIRABILIS SUSCEPTIBILITIES PERFORMED ON PREVIOUS CULTURE WITHIN THE LAST 5 DAYS. Performed at [facility], [place] [address], [place], [place] [address]    Report Status 10/21/2021 FINAL  Final  Culture, blood (routine x 2)     Status: Abnormal   Collection Time: 10/18/21  9:37 AM   Specimen: BLOOD  Result Value Ref Range Status   Specimen Description   Final    BLOOD  LAC Performed at [facility], [address], [place], [place] [address]    Special Requests   Final    BOTTLES DRAWN AEROBIC AND ANAEROBIC Blood Culture adequate volume Performed at [facility], [place] [address] [place], [place] [address]    Culture  Setup Time   Final    Organism ID to follow GRAM NEGATIVE  RODS IN BOTH AEROBIC AND ANAEROBIC BOTTLES CRITICAL RESULT CALLED TO, READ BACK BY AND VERIFIED WITH: Lillia Abed @0031  ON 10/19/21 SKL Performed at [facility], [address] [place], [place] [address]    Culture PROTEUS MIRABILIS (A)  Final   Report Status 10/21/2021 FINAL  Final   Organism ID, Bacteria PROTEUS MIRABILIS  Final      Susceptibility   Proteus mirabilis - MIC*    AMPICILLIN <=2 SENSITIVE Sensitive     CEFAZOLIN <=4 SENSITIVE Sensitive     CEFEPIME <=0.12 SENSITIVE Sensitive     CEFTAZIDIME <=1 SENSITIVE Sensitive     CEFTRIAXONE <=0.25 SENSITIVE Sensitive     CIPROFLOXACIN <=0.25 SENSITIVE Sensitive     GENTAMICIN <=1 SENSITIVE Sensitive     IMIPENEM 2 SENSITIVE Sensitive     TRIMETH/SULFA <=20 SENSITIVE Sensitive     AMPICILLIN/SULBACTAM <=2 SENSITIVE Sensitive     PIP/TAZO <=4 SENSITIVE Sensitive     * PROTEUS MIRABILIS  Blood Culture ID Panel (Reflexed)     Status: Abnormal   Collection Time: 10/18/21  9:37 AM  Result Value Ref Range Status   Enterococcus faecalis NOT DETECTED NOT DETECTED Final   Enterococcus Faecium NOT DETECTED NOT DETECTED Final   Listeria monocytogenes NOT DETECTED NOT DETECTED Final   Staphylococcus species NOT DETECTED NOT DETECTED Final   Staphylococcus aureus (BCID) NOT DETECTED NOT DETECTED Final   Staphylococcus epidermidis NOT DETECTED NOT DETECTED Final   Staphylococcus lugdunensis NOT DETECTED NOT DETECTED Final   Streptococcus species NOT DETECTED NOT DETECTED Final   Streptococcus agalactiae NOT DETECTED NOT DETECTED Final   Streptococcus pneumoniae NOT DETECTED NOT DETECTED Final   Streptococcus pyogenes NOT DETECTED NOT DETECTED Final   A.calcoaceticus-baumannii NOT DETECTED NOT DETECTED Final   Bacteroides fragilis NOT DETECTED NOT DETECTED Final   Enterobacterales DETECTED (A) NOT DETECTED Final    Comment: Enterobacterales represent a large order of gram negative bacteria, not a single organism. CRITICAL  RESULT CALLED TO, READ BACK BY AND VERIFIED WITH: Lillia Abed @0031  ON 10/19/21 SKL    Enterobacter cloacae complex NOT DETECTED NOT DETECTED Final   Escherichia coli NOT DETECTED NOT DETECTED Final   Klebsiella aerogenes NOT DETECTED NOT DETECTED Final   Klebsiella oxytoca NOT DETECTED NOT DETECTED Final   Klebsiella pneumoniae NOT DETECTED NOT DETECTED Final   Proteus species DETECTED (A) NOT DETECTED Final    Comment: CRITICAL RESULT CALLED TO, READ BACK BY  AND VERIFIED WITH: Lillia Abed @0031  ON 10/19/21 SKL    Salmonella species NOT DETECTED NOT DETECTED Final   Serratia marcescens NOT DETECTED NOT DETECTED Final   Haemophilus influenzae NOT DETECTED NOT DETECTED Final   Neisseria meningitidis NOT DETECTED NOT DETECTED Final   Pseudomonas aeruginosa NOT DETECTED NOT DETECTED Final   Stenotrophomonas maltophilia NOT DETECTED NOT DETECTED Final   Candida albicans NOT DETECTED NOT DETECTED Final   Candida auris NOT DETECTED NOT DETECTED Final   Candida glabrata NOT DETECTED NOT DETECTED Final   Candida krusei NOT DETECTED NOT DETECTED Final   Candida parapsilosis NOT DETECTED NOT DETECTED Final   Candida tropicalis NOT DETECTED NOT DETECTED Final   Cryptococcus neoformans/gattii NOT DETECTED NOT DETECTED Final   CTX-M ESBL NOT DETECTED NOT DETECTED Final   Carbapenem resistance IMP NOT DETECTED NOT DETECTED Final   Carbapenem resistance KPC NOT DETECTED NOT DETECTED Final   Carbapenem resistance NDM NOT DETECTED NOT DETECTED Final   Carbapenem resist OXA 48 LIKE NOT DETECTED NOT DETECTED Final   Carbapenem resistance VIM NOT DETECTED NOT DETECTED Final    Comment: Performed at [facility], [address]  [place], [place] [address]  Resp Panel by RT-PCR (Flu A&B, Covid) Nasopharyngeal Swab     Status: None   Collection Time: 10/18/21 11:15 AM   Specimen: Nasopharyngeal Swab; Nasopharyngeal(NP) swabs in vial transport medium  Result Value Ref Range Status   SARS  Coronavirus 2 by RT PCR NEGATIVE NEGATIVE Final    Comment: (NOTE) SARS-CoV-2 target nucleic acids are NOT DETECTED.  The SARS-CoV-2 RNA is generally detectable in upper respiratory specimens during the acute phase of infection. The lowest concentration of SARS-CoV-2 viral copies this assay can detect is 138 copies/mL. A negative result does not preclude SARS-Cov-2 infection and should not be used as the sole basis for treatment or other patient management decisions. A negative result may occur with  improper specimen collection/handling, submission of specimen other than nasopharyngeal swab, presence of viral mutation(s) within the areas targeted by this assay, and inadequate number of viral copies(<138 copies/mL). A negative result must be combined with clinical observations, patient history, and epidemiological information. The expected result is Negative.  Fact Sheet for Patients:  EntrepreneurPulse.com.au  Fact Sheet for Healthcare Providers:  IncredibleEmployment.be  This test is no t yet approved or cleared by the [place] FDA and  has been authorized for detection and/or diagnosis of SARS-CoV-2 by FDA under an Emergency Use Authorization (EUA). This EUA will remain  in effect (meaning this test can be used) for the duration of the COVID-19 declaration under Section 564(b)(1) of the Act, 21 U.S.C.section 360bbb-3(b)(1), unless the authorization is terminated  or revoked sooner.       Influenza A by PCR NEGATIVE NEGATIVE Final   Influenza B by PCR NEGATIVE NEGATIVE Final    Comment: (NOTE) The Xpert Xpress SARS-CoV-2/FLU/RSV plus assay is intended as an aid in the diagnosis of influenza from Nasopharyngeal swab specimens and should not be used as a sole basis for treatment. Nasal washings and aspirates are unacceptable for Xpert Xpress SARS-CoV-2/FLU/RSV testing.  Fact Sheet for  Patients: EntrepreneurPulse.com.au  Fact Sheet for Healthcare Providers: IncredibleEmployment.be  This test is not yet approved or cleared by the [place] FDA and has been authorized for detection and/or diagnosis of SARS-CoV-2 by FDA under an Emergency Use Authorization (EUA). This EUA will remain in effect (meaning this test can be used) for the duration of the COVID-19 declaration under Section  564(b)(1) of the Act, 21 U.S.C. section 360bbb-3(b)(1), unless the authorization is terminated or revoked.  Performed at [facility], [address] [place], [place] [address]   MRSA Next Gen by PCR, Nasal     Status: None   Collection Time: 10/18/21 12:56 PM   Specimen: Nasal Mucosa; Nasal Swab  Result Value Ref Range Status   MRSA by PCR Next Gen NOT DETECTED NOT DETECTED Final    Comment: (NOTE) The GeneXpert MRSA Assay (FDA approved for NASAL specimens only), is one component of a comprehensive MRSA colonization surveillance program. It is not intended to diagnose MRSA infection nor to guide or monitor treatment for MRSA infections. Test performance is not FDA approved in patients less than 64 years old. Performed at [facility], [address], [place], [place] [address]   Urine Culture     Status: None   Collection Time: 10/19/21 11:13 AM   Specimen: Urine, Random  Result Value Ref Range Status   Specimen Description   Final    URINE, RANDOM Performed at [facility], [address], [place], [place] [address]    Special Requests   Final    Normal Performed at [facility], [address] [place], [place] [address]    Culture   Final    NO GROWTH Performed at [facility], [place] [address] [place], [place] [address]    Report Status 10/20/2021 FINAL  Final    Coagulation Studies: No results for input(s): LABPROT, INR in the last 72 hours.   Urinalysis: Recent Labs     10/19/21 1113  COLORURINE AMBER*  LABSPEC 1.015  PHURINE 5.0  GLUCOSEU 50*  HGBUR LARGE*  BILIRUBINUR NEGATIVE  KETONESUR NEGATIVE  PROTEINUR 100*  NITRITE NEGATIVE  LEUKOCYTESUR LARGE*       Imaging: No results found.   Medications:    cefTRIAXone (ROCEPHIN)  IV 2 g (10/21/21 1220)   lactated ringers 75 mL/hr at 10/22/21 0857    amLODipine  5 mg Oral Daily   busPIRone  10 mg Oral BID   cholecalciferol  1,000 Units Oral Daily   citalopram  40 mg Oral Daily   fluticasone furoate-vilanterol  1 puff Inhalation Daily   And   umeclidinium bromide  1 puff Inhalation Daily   heparin  5,000 Units Subcutaneous Q8H   lidocaine  1 patch Transdermal Q24H   loratadine  10 mg Oral Daily   traZODone  100 mg Oral QHS   vitamin B-12  1,000 mcg Oral Daily   acetaminophen **OR** acetaminophen, albuterol, LORazepam, ondansetron **OR** ondansetron (ZOFRAN) IV, oxyCODONE  Assessment/ Plan:  [name] is a 75 y.o.  female with past medical conditions including hypertension, hyperlipidemia, anxiety and depression, pulmonary hypertension, and asthma, who was admitted to [facility] on 10/18/2021 for Acute kidney injury ([place]) [N17.9] Severe sepsis with acute organ dysfunction ([place]) [A41.9, R65.20] Non-traumatic rhabdomyolysis [M62.82] Altered mental status, unspecified altered mental status type [R41.82] Sepsis with acute renal failure without septic shock, due to unspecified organism, unspecified acute renal failure type ([place]) [A41.9, R65.20, N17.9]   Acute Kidney Injury secondary to rhabdomyolysis. Baseline creatinine 0.84 on 07/29/21.  Acute kidney injury secondary to rhabdomyolysis Renal ultrasound negative for obstruction, but positive for a 7 mm left renal calculus.  No IV contrast exposure.  ANCA and ANA negative.  Complements are normal.  Renal function slowly improving.  Creatinine down to 3.48.  No immediate need for dialysis.  Continue hydration for  now.  Lab Results   Component Value Date   CREATININE 3.48 (H) 10/22/2021   CREATININE 3.90 (H) 10/21/2021   CREATININE 3.78 (H) 10/20/2021    Intake/Output Summary (Last 24 hours) at 10/22/2021 1020 Last data filed at 10/22/2021 0800 Gross per 24 hour  Intake 1659 ml  Output 650 ml  Net 1009 ml    2. Hypertension continue amlodipine.   3. Hyponatremia.  Improved.  Serum sodium up to 137.  4. Severe Sepsis with hypotension and acute metabolic encephalopathy. Blood cultures growing proteus.  Receiving Rocephin  Awaiting ID consult   LOS: 4 Vitaliy Eisenhour 11/11/202210:20 AM

## 2021-10-23 DIAGNOSIS — N179 Acute kidney failure, unspecified: Secondary | ICD-10-CM | POA: Diagnosis not present

## 2021-10-23 DIAGNOSIS — M25561 Pain in right knee: Secondary | ICD-10-CM | POA: Diagnosis not present

## 2021-10-23 DIAGNOSIS — R531 Weakness: Secondary | ICD-10-CM | POA: Diagnosis not present

## 2021-10-23 DIAGNOSIS — A4159 Other Gram-negative sepsis: Secondary | ICD-10-CM | POA: Diagnosis not present

## 2021-10-23 DIAGNOSIS — N83202 Unspecified ovarian cyst, left side: Secondary | ICD-10-CM

## 2021-10-23 LAB — COMPREHENSIVE METABOLIC PANEL
ALT: 71 U/L — ABNORMAL HIGH (ref 0–44)
AST: 53 U/L — ABNORMAL HIGH (ref 15–41)
Albumin: 1.9 g/dL — ABNORMAL LOW (ref 3.5–5.0)
Alkaline Phosphatase: 163 U/L — ABNORMAL HIGH (ref 38–126)
Anion gap: 7 (ref 5–15)
BUN: 46 mg/dL — ABNORMAL HIGH (ref 8–23)
CO2: 23 mmol/L (ref 22–32)
Calcium: 8.3 mg/dL — ABNORMAL LOW (ref 8.9–10.3)
Chloride: 108 mmol/L (ref 98–111)
Creatinine, Ser: 2.63 mg/dL — ABNORMAL HIGH (ref 0.44–1.00)
GFR, Estimated: 18 mL/min — ABNORMAL LOW (ref >60)
Glucose, Bld: 112 mg/dL — ABNORMAL HIGH (ref 70–99)
Potassium: 3.7 mmol/L (ref 3.5–5.1)
Sodium: 138 mmol/L (ref 135–145)
Total Bilirubin: 0.4 mg/dL (ref 0.3–1.2)
Total Protein: 4.7 g/dL — ABNORMAL LOW (ref 6.5–8.1)

## 2021-10-23 LAB — CBC
HCT: 32.7 % — ABNORMAL LOW (ref 36.0–46.0)
Hemoglobin: 10.8 g/dL — ABNORMAL LOW (ref 12.0–15.0)
MCH: 29.8 pg (ref 26.0–34.0)
MCHC: 33 g/dL (ref 30.0–36.0)
MCV: 90.3 fL (ref 80.0–100.0)
Platelets: 180 10*3/uL (ref 150–400)
RBC: 3.62 MIL/uL — ABNORMAL LOW (ref 3.87–5.11)
RDW: 13.7 % (ref 11.5–15.5)
WBC: 7.8 10*3/uL (ref 4.0–10.5)
nRBC: 0 % (ref 0.0–0.2)

## 2021-10-23 NOTE — TOC Progression Note (Signed)
Transition of Care [facility]) - Progression Note    Patient Details  Name: Nancy Blackburn MRN: [id number] Date of Birth: 05-25-1946  [facility] CM/SW Contact  Izola Price, RN Phone Number: 10/23/2021, 11:07 AM  Clinical Narrative: Disposition changed to STR/SNF. Spoke with patient who is A&O and has been at both [facility] and [facility]. First choice is [facility], [facility], [facility] [facility]. [facility] is closer to her family. FL2/PASRR. CSW referral sent with broad bed search with those preferences noted. Discussed broad bed search in case but will bring choices/acceptance to patient when received. Patient had no further questions or concerns at this time. Simmie Davies RN CM     Expected Discharge Plan: [place] Barriers to Discharge: Continued Medical Work up  Expected Discharge Plan and Services Expected Discharge Plan: [place] In-house Referral: NA Discharge Planning Services: CM Consult, Follow-up appt scheduled ([facility] outpatient PT) Post Acute Care Choice: NA (patient has walker, shower chair and bsc) Living arrangements for the past 2 months: Single Family Home                 DME Arranged:  (none required)         HH Arranged:  (Patient chooses outpatient PT)           Social Determinants of Health (SDOH) Interventions    Readmission Risk Interventions No flowsheet data found.

## 2021-10-23 NOTE — Progress Notes (Signed)
Patient ID: [name], female   DOB: 20-Jan-1946, 75 y.o.   MRN: [id number] [facility] PROGRESS NOTE  Nancy Blackburn QVZ:[id number] DOB: [birth year] DOA: 10/18/2021 PCP: Jerrol Banana., MD  HPI/Subjective: Patient feels okay.  Still having some discomfort with her knee and she is concerned about going home because she will not have anybody there.  Objective: Vitals:   10/23/21 0745 10/23/21 1147  BP: (!) 165/76 (!) 144/73  Pulse: 95 85  Resp: 18 16  Temp: 98 F (36.7 C) 98.1 F (36.7 C)  SpO2: 94% 95%    Intake/Output Summary (Last 24 hours) at 10/23/2021 1314 Last data filed at 10/23/2021 0544 Gross per 24 hour  Intake 1653.41 ml  Output 1100 ml  Net 553.41 ml   Filed Weights   10/18/21 0853 10/19/21 1500  Weight: 72.6 kg 86.2 kg    ROS: Review of Systems  Respiratory:  Negative for cough and shortness of breath.   Cardiovascular:  Negative for chest pain.  Gastrointestinal:  Negative for abdominal pain.  Musculoskeletal:  Positive for joint pain.  Exam: Physical Exam HENT:     Head: Normocephalic.     Mouth/Throat:     Pharynx: No oropharyngeal exudate.  Eyes:     General: Lids are normal.     Conjunctiva/sclera: Conjunctivae normal.  Cardiovascular:     Rate and Rhythm: Normal rate and regular rhythm.     Heart sounds: Normal heart sounds, S1 normal and S2 normal.  Pulmonary:     Breath sounds: Normal breath sounds. No decreased breath sounds, wheezing, rhonchi or rales.  Abdominal:     Palpations: Abdomen is soft.     Tenderness: There is no abdominal tenderness.  Musculoskeletal:     Right lower leg: No swelling.     Left lower leg: No swelling.  Skin:    General: Skin is warm.     Findings: No rash.  Neurological:     Mental Status: She is alert and oriented to person, place, and time.      Scheduled Meds:  amLODipine  10 mg Oral Daily   busPIRone  10 mg Oral BID   cholecalciferol  1,000 Units Oral Daily    citalopram  40 mg Oral Daily   fluticasone furoate-vilanterol  1 puff Inhalation Daily   And   umeclidinium bromide  1 puff Inhalation Daily   heparin  5,000 Units Subcutaneous Q8H   lidocaine  1 patch Transdermal Q24H   loratadine  10 mg Oral Daily   traZODone  100 mg Oral QHS   vitamin B-12  1,000 mcg Oral Daily   Continuous Infusions:   ceFAZolin (ANCEF) IV 1 g (10/23/21 1027)    Assessment/Plan:  Weakness and knee pain.  Physical therapy now recommending rehab.  Right knee pain worse likely from fall and rhabdomyolysis rather than infection.  Transitional care team looking into rehabs. Acute kidney injury from rhabdomyolysis and initial hypotension from sepsis.  Creatinine peaked at 3.9 and now down to 2.63.  Discontinue IV fluids Severe sepsis with Proteus growing out of blood cultures, present on admission.  Had acute kidney injury hypotension and acute metabolic encephalopathy on presentation.  Also had initial tachypnea, tachycardia and leukocytosis.  Patient currently on Ancef and can be switched to oral upon disposition antibiotics to 10/28/2021. Acute rhabdomyolysis from fall.  CPK trended better from 15,867 down to 613.  Discontinue fluids. Elevated liver function test secondary to sepsis.  LFTs trending better.  AST 53  and ALT 71. Acute metabolic encephalopathy improved Asthma on inhalers Parkinson's disease Essential hypertension on Norvasc Left adnexa with mildly complex cystic lesion.  Follow-up with gynecology as outpatient.  Follow-up on [place] blood test.        Code Status:     Code Status Orders  (From admission, onward)           Start     Ordered   10/18/21 1220  Full code  Continuous        10/18/21 1223           Code Status History     Date Active Date Inactive Code Status Order ID Comments User Context   03/09/2021 1232 03/12/2021 2121 Full Code [id number]  Thornton Park, MD Inpatient   07/01/2020 2328 07/03/2020 2053 Full Code [id number]   Mansy, Arvella Merles, MD ED      Advance Directive Documentation    Flowsheet Row Most Recent Value  Type of Advance Directive Healthcare Power of Attorney, Living will  Pre-existing out of facility DNR order (yellow form or pink MOST form) --  "MOST" Form in Place? --      Family Communication: Updated daughter-in-law on the phone [phone] Disposition Plan: Status is: Inpatient.  Stable to for discharge out to rehab once bed obtained  Antibiotics: Ancef  Time spent: 27 minutes  [place]

## 2021-10-23 NOTE — Progress Notes (Signed)
Physical Therapy Treatment Patient Details Name: Nancy Blackburn MRN: [id number] DOB: [birth year] Today's Date: 10/23/2021   History of Present Illness Patient is alert and awake.  She came in not feeling well.  Started on Sunday night.  Was found on Monday morning.  She was on the floor for an undetermined amount of time.  Admitted with sepsis, acute kidney injury and rhabdomyolysis. Patient with recent knee replacement and is having severe pain in right knee.    PT Comments    Pt was asleep in long sitting upon arriving. She easily awakes and is cooperative and pleasant throughout. Does have slight tremors/shakes throughout that she reports is present at baseline. Pt required min assist to exit bed due to R knee pain. HOB was elevated + increased time to perform. Sat EOB x several minutes prior to standing to RW. She has RW at home from previous TKA surgery. Pt was unable to stand form lowest bed height however with slightly elevated bed height, stood to RW with CGA for safety. She safely ambulated into BR for BM. Was able to Independently perform hygiene care afterwards. Session proceeded to ambulation to recliner( ~ 30 ft) without LOB however pt does endorse slight fatigue. Overall pt does well when standing with RW. Lengthy discussion about DC disposition. Pt/pt's son concerned about DC home from acute rehab due to limited assistance throughout the day. Pt lives with grandson however he will not be present during daytime hours. Reached out to MD to report pt's desire for rehab at DC. She would greatly benefit from more aggressive PT post acute hospitalization. DC recs changed to rehab. Acute PT will continue to progress pt per current POC.     Recommendations for follow up therapy are one component of a multi-disciplinary discharge planning process, led by the attending physician.  Recommendations may be updated based on patient status, additional functional criteria and insurance  authorization.  Follow Up Recommendations  Skilled nursing-short term rehab (<3 hours/day)     Assistance Recommended at Discharge Intermittent Supervision/Assistance  Equipment Recommendations  None recommended by PT       Precautions / Restrictions Precautions Precautions: Fall Restrictions Weight Bearing Restrictions: No     Mobility  Bed Mobility Overal bed mobility: Needs Assistance Bed Mobility: Supine to Sit     Supine to sit: Min assist     General bed mobility comments: Pt did require min assist to exit R sid eof bed with increased time required + vcs for improved sequencing. HOB elevated. increased time to perform due to R knee pain. Ice was on R knee pre/post session.    Transfers Overall transfer level: Needs assistance Equipment used: Rolling walker (2 wheels) Transfers: Sit to/from Stand Sit to Stand: Min guard           General transfer comment: CGA from elevated bed height. pt struggles to stand from lowest bed height.    Ambulation/Gait Ambulation/Gait assistance: Supervision Gait Distance (Feet): 30 Feet Assistive device: Rolling walker (2 wheels) Gait Pattern/deviations: Step-through pattern;Decreased step length - right;Decreased step length - left Gait velocity: decreased     General Gait Details: pt ambulated to BR, had successful BM, prior to standing and ambulating to recliner. She has no LOB or unsteadiness but did fatigue quickly. Pt has small tremors/shakes throughout session that she reports she has at baseline.   Stairs Stairs:  (has a ramp entry to home.)          Balance Overall balance assessment: Needs assistance  Sitting-balance support: Feet supported Sitting balance-Leahy Scale: Good     Standing balance support: Bilateral upper extremity supported;During functional activity Standing balance-Leahy Scale: Fair Standing balance comment: patient is able to stand at commode with no UE support to don pull ups, min guard  for safety, otherwise needing B UE support with mobiltiy       Cognition Arousal/Alertness: Awake/alert Behavior During Therapy: WFL for tasks assessed/performed Overall Cognitive Status: Within Functional Limits for tasks assessed      General Comments: Pt is A and O x 4               Pertinent Vitals/Pain Pain Assessment: 0-10 Pain Score: 4  Breathing: normal Negative Vocalization: none Facial Expression: smiling or inexpressive Body Language: relaxed Consolability: no need to console PAINAD Score: 0 Pain Location: R knee Pain Descriptors / Indicators: Discomfort;Sore;Grimacing;Guarding Pain Intervention(s): Limited activity within patient's tolerance;Monitored during session;Repositioned;Ice applied (Ice applied to R knee pre/post session)     PT Goals (current goals can now be found in the care plan section) Acute Rehab PT Goals Patient Stated Goal: rehab then home Progress towards PT goals: Progressing toward goals    Frequency    Min 2X/week      PT Plan Discharge plan needs to be updated;Other (comment) (pt and son concerned about pt being home independently throughout the day. Requesting rehab at DC for safety and increased PT post acute hospitalization.)       AM-PAC PT "6 Clicks" Mobility   Outcome Measure  Help needed turning from your back to your side while in a flat bed without using bedrails?: A Little Help needed moving from lying on your back to sitting on the side of a flat bed without using bedrails?: A Little Help needed moving to and from a bed to a chair (including a wheelchair)?: A Little Help needed standing up from a chair using your arms (e.g., wheelchair or bedside chair)?: A Little Help needed to walk in hospital room?: A Little Help needed climbing 3-5 steps with a railing? : A Little 6 Click Score: 18    End of Session   Activity Tolerance: Patient tolerated treatment well;Patient limited by fatigue Patient left: in chair;with  call bell/phone within reach;with chair alarm set;with family/visitor present Nurse Communication: Mobility status PT Visit Diagnosis: Unsteadiness on feet (R26.81);Other abnormalities of gait and mobility (R26.89);Pain Pain - Right/Left: Right Pain - part of body: Knee     Time: 0727-0751 PT Time Calculation (min) (ACUTE ONLY): 24 min  Charges:  $Gait Training: 8-22 mins $Therapeutic Activity: 8-22 mins                     Julaine Fusi PTA 10/23/21, 8:01 AM

## 2021-10-23 NOTE — NC FL2 (Signed)
[place] LEVEL OF CARE SCREENING TOOL     IDENTIFICATION  Patient Name: Nancy Blackburn Birthdate: [birth year] Sex: female Admission Date (Current Location): 10/18/2021  [facility] and [place] Number:  Engineering geologist and Address:  [facility], [address] [place],  [address]      Provider Number: [id number]  Attending Physician Name and Address:  Loletha Grayer, MD  Relative Name and Phone Number:  Coralie Common)   [phone] [facility])    Current Level of Care: Hospital Recommended Level of Care: [place] Prior Approval Number:    Date Approved/Denied: 10/08/13 PASRR Number: [id number]  Discharge Plan: SNF    Current Diagnoses: Patient Active Problem List   Diagnosis Date Noted   Acute pain of right knee    Elevated LFTs    Acute metabolic encephalopathy    Sepsis due to Proteus species ([place]) 10/18/2021   Rhabdomyolysis 10/18/2021   Acute kidney injury ([place]) 10/18/2021   Leukocytosis 10/18/2021   Asthma 07/02/2021   S/P TKR (total knee replacement) using cement, right 03/09/2021   Bradycardia 07/03/2020   Postural dizziness with presyncope 07/01/2020   Family history of colon cancer in mother 02/22/2018   Cervical spondylosis 05/04/2017   Cervical radiculopathy 05/04/2017   Family history of Cushing disease 01/12/2017   B12 deficiency 12/18/2016   Mild cognitive impairment with memory loss 12/18/2016   Parkinsonian features 12/18/2016   Vitamin D deficiency 12/18/2016   Chalazion, bilateral 06/08/2016   Absolute anemia 07/30/2015   Edema extremities 07/30/2015   Benign essential tremor 07/30/2015   Anxiety, generalized 07/30/2015   Gastro-esophageal reflux disease without esophagitis 07/30/2015   HLD (hyperlipidemia) 07/30/2015   Cannot sleep 07/30/2015   Lumbar radiculopathy 07/30/2015   Depression, major, recurrent, in partial remission ([place]) 07/30/2015   Arthritis,  degenerative 07/30/2015   Allergic rhinitis 07/30/2015   Gastroduodenal ulcer 07/30/2015   Calcium blood increased 07/30/2015   Pre-syncope 05/17/2015   Dyspnea 03/31/2015   Bleeding stomach ulcer 10/14/2013   Anemia, iron deficiency 10/14/2013   Weakness 10/14/2013   Ejection fraction    Pericardial effusion    Palpitations    Low back pain    Normal nuclear stress test     Orientation RESPIRATION BLADDER Height & Weight     Self, Time, Situation, Place  Normal Continent Weight: 86.2 kg Height:  5\' 7"  (170.2 cm)  BEHAVIORAL SYMPTOMS/MOOD NEUROLOGICAL BOWEL NUTRITION STATUS      Continent Diet  AMBULATORY STATUS COMMUNICATION OF NEEDS Skin   Limited Assist (Admitted due to fall, high fall risk, balance issues.) Verbally Normal, Other (Comment) (Left elbow bruising documented under skin care.)                       Personal Care Assistance Level of Assistance  Bathing, Dressing Bathing Assistance: Limited assistance   Dressing Assistance: Limited assistance     Functional Limitations Info             SPECIAL CARE FACTORS FREQUENCY  PT (By licensed PT), OT (By licensed OT)     PT Frequency: 5x/week OT Frequency: 5x/week            Contractures Contractures Info: Not present    Additional Factors Info  Code Status, Allergies Code Status Info: Full Code Status Allergies Info: 7 medications/See chart for details.Cymbalta (Duloxetine Hcl) Cymbalta (Duloxetine Hcl)  Other (See Comments) High Allergy 03/31/2015   REACTION: Urinary retention  Deletion Reason:  Donepezil Hcl Donepezil Hcl  Other (See Comments) Not Specified  07/02/2020   Significant bradycardia causing syncope  Deletion Reason:   Nsaids Nsaids   Not Specified  05/18/2015   GI BLEED  Deletion Reason:   Ranitidine Ranitidine   Not Specified  05/18/2015   severe diarrhea  Deletion Reason:   Venlafaxine Venlafaxine   Not Specified  05/18/2015   severe depression  Deletion Reason:   Adverse Reactions/Drug  Intolerances     Anti-inflammatory Enzyme (Nutritional Supplements) Anti-inflammatory Enzyme (Nutritional Supplements)  Other (See Comments) High Intolerance 03/31/2015   REACTION: Due to bleeding ulcer  Deletion Reason:   Clarithromycin Clarithromycin  Other (See Comments) Medium Intolerance 11/11/2011           Current Medications (10/23/2021):  This is the current hospital active medication list Current Facility-Administered Medications  Medication Dose Route Frequency Provider Last Rate Last Admin   acetaminophen (TYLENOL) tablet 650 mg  650 mg Oral Q6H PRN Cox, Amy N, DO   650 mg at 10/23/21 9379   Or   acetaminophen (TYLENOL) suppository 650 mg  650 mg Rectal Q6H PRN Cox, Amy N, DO       albuterol (PROVENTIL) (2.5 MG/3ML) 0.083% nebulizer solution 3 mL  3 mL Inhalation Q6H PRN Cox, Amy N, DO       amLODipine (NORVASC) tablet 10 mg  10 mg Oral Daily Loletha Grayer, MD   10 mg at 10/23/21 0900   busPIRone (BUSPAR) tablet 10 mg  10 mg Oral BID Loletha Grayer, MD   10 mg at 10/23/21 0240   ceFAZolin (ANCEF) IVPB 1 g/50 mL premix  1 g Intravenous Q12H Tsosie Billing, MD       cholecalciferol (VITAMIN D3) tablet 1,000 Units  1,000 Units Oral Daily Loletha Grayer, MD   1,000 Units at 10/23/21 0900   citalopram (CELEXA) tablet 40 mg  40 mg Oral Daily Loletha Grayer, MD   40 mg at 10/23/21 0900   fluticasone furoate-vilanterol (BREO ELLIPTA) 200-25 MCG/ACT 1 puff  1 puff Inhalation Daily Benita Gutter, RPH   1 puff at 10/23/21 0859   And   umeclidinium bromide (INCRUSE ELLIPTA) 62.5 MCG/ACT 1 puff  1 puff Inhalation Daily Benita Gutter, RPH   1 puff at 10/23/21 0859   heparin injection 5,000 Units  5,000 Units Subcutaneous Q8H Cox, Amy N, DO   5,000 Units at 10/23/21 0528   lidocaine (LIDODERM) 5 % 1 patch  1 patch Transdermal Q24H Loletha Grayer, MD   1 patch at 10/22/21 1250   loratadine (CLARITIN) tablet 10 mg  10 mg Oral Daily Loletha Grayer, MD   10 mg at 10/23/21  0900   LORazepam (ATIVAN) tablet 0.5 mg  0.5 mg Oral TID PRN Loletha Grayer, MD   0.5 mg at 10/23/21 0900   ondansetron (ZOFRAN) tablet 4 mg  4 mg Oral Q6H PRN Cox, Amy N, DO       Or   ondansetron (ZOFRAN) injection 4 mg  4 mg Intravenous Q6H PRN Cox, Amy N, DO       oxyCODONE (Oxy IR/ROXICODONE) immediate release tablet 2.5 mg  2.5 mg Oral Q4H PRN Loletha Grayer, MD   2.5 mg at 10/23/21 0900   traZODone (DESYREL) tablet 100 mg  100 mg Oral QHS Loletha Grayer, MD   100 mg at 10/22/21 2100   vitamin B-12 (CYANOCOBALAMIN) tablet 1,000 mcg  1,000 mcg Oral Daily Loletha Grayer, MD   1,000 mcg at 10/23/21 0900  Discharge Medications: Please see discharge summary for a list of discharge medications.  Relevant Imaging Results:  Relevant Lab Results:   Additional Information    Izola Price, RN

## 2021-10-23 NOTE — Progress Notes (Signed)
[facility]  PROGRESS NOTE   Subjective:   Patient is out of bed to chair.  Creatinine levels have improved to 2.63. He is much better today. Has been urinating well.  Objective:  Vital signs in last 24 hours:  Temp:  [97.7 F (36.5 C)-99.5 F (37.5 C)] 98.1 F (36.7 C) (11/12 1147) Pulse Rate:  [85-95] 85 (11/12 1147) Resp:  [16-20] 16 (11/12 1147) BP: (144-171)/(62-76) 144/73 (11/12 1147) SpO2:  [90 %-97 %] 95 % (11/12 1147)  Weight change:  Filed Weights   10/18/21 0853 10/19/21 1500  Weight: 72.6 kg 86.2 kg    Intake/Output: I/O last 3 completed shifts: In: 3192.4 [P.O.:700; I.V.:2492.4] Out: 1550 [CWCBJ:6283]   Intake/Output this shift:  No intake/output data recorded.  Physical Exam: General:  No acute distress  Head:  Normocephalic, atraumatic. Moist oral mucosal membranes  Eyes:  Anicteric  Neck:  Supple  Lungs:   Clear to auscultation, normal effort  Heart:  S1S2 no rubs  Abdomen:   Soft, nontender, bowel sounds present  Extremities:  peripheral edema.  Neurologic:  Awake, alert, following commands  Skin:  No lesions  Access:     Basic Metabolic Panel: Recent Labs  Lab 10/18/21 1256 10/19/21 0439 10/20/21 0257 10/21/21 0715 10/22/21 0547 10/23/21 0405  NA  --  134* 133* 134* 137 138  K  --  3.6 4.2 3.4* 4.0 3.7  CL  --  103 103 105 106 108  CO2  --  20* 20* 21* 21* 23  GLUCOSE  --  100* 135* 107* 98 112*  BUN  --  57* 67* 59* 54* 46*  CREATININE  --  2.96* 3.78* 3.90* 3.48* 2.63*  CALCIUM  --  7.6* 8.1* 8.2* 8.6* 8.3*  MG 1.9  --   --   --   --   --   PHOS 3.9  --   --   --   --   --     CBC: Recent Labs  Lab 10/18/21 0903 10/19/21 0439 10/20/21 0257 10/23/21 0405  WBC 12.9* 9.9 9.5 7.8  NEUTROABS  --  7.9*  --   --   HGB 14.3 12.5 11.6* 10.8*  HCT 42.1 38.4 34.2* 32.7*  MCV 88.3 91.6 89.3 90.3  PLT 131* 106* 98* 180     Urinalysis: No results for input(s): COLORURINE, LABSPEC, PHURINE, GLUCOSEU, HGBUR,  BILIRUBINUR, KETONESUR, PROTEINUR, UROBILINOGEN, NITRITE, LEUKOCYTESUR in the last 72 hours.  Invalid input(s): APPERANCEUR    Imaging: US PELVIC COMPLETE WITH TRANSVAGINAL  Result Date: 10/22/2021 CLINICAL DATA:  Ovarian cyst on CT, postmenopausal EXAM: TRANSABDOMINAL AND TRANSVAGINAL ULTRASOUND OF PELVIS TECHNIQUE: Both transabdominal and transvaginal ultrasound examinations of the pelvis were performed. Transabdominal technique was performed for global imaging of the pelvis including uterus, ovaries, adnexal regions, and pelvic cul-de-sac. It was necessary to proceed with endovaginal exam following the transabdominal exam to visualize the RIGHT ovary, and to characterize a LEFT ovarian cystic lesion. COMPARISON:  CT abdomen and pelvis 10/18/2021, ultrasound pelvis 08/07/2015 FINDINGS: Uterus Surgically absent Endometrium Surgically absent Right ovary Not visualized, likely obscured by bowel Left ovary No normal appearing ovary visualized, see below Other findings No free pelvic fluid. Large LEFT adnexal cyst potentially of ovarian origin, 8.0 x 8.2 x 7.8 cm in size. Imaging demonstrates a partial septation and question debris dependently. No other complicating features. No abnormal blood flow on color Doppler imaging. IMPRESSION: Surgical absence of uterus with nonvisualization of RIGHT ovary. 8.0 cm diameter mildly complicated  cystic lesion of the LEFT adnexa potentially of ovarian origin, corresponding to CT finding. Lesion is concerning for a low-grade cystic LEFT ovarian neoplasm. Gynecological evaluation is recommended. Electronically Signed   By: Lavonia Dana M.D.   On: 10/22/2021 12:33     Medications:     ceFAZolin (ANCEF) IV 1 g (10/23/21 1027)    amLODipine  10 mg Oral Daily   busPIRone  10 mg Oral BID   cholecalciferol  1,000 Units Oral Daily   citalopram  40 mg Oral Daily   fluticasone furoate-vilanterol  1 puff Inhalation Daily   And   umeclidinium bromide  1 puff Inhalation  Daily   heparin  5,000 Units Subcutaneous Q8H   lidocaine  1 patch Transdermal Q24H   loratadine  10 mg Oral Daily   traZODone  100 mg Oral QHS   vitamin B-12  1,000 mcg Oral Daily    Assessment/ Plan:     Principal Problem:   Sepsis due to Proteus species (HCC) Active Problems:   Anemia, iron deficiency   Weakness   Benign essential tremor   Anxiety, generalized   HLD (hyperlipidemia)   Depression, major, recurrent, in partial remission (H[facility])   B12 deficiency   Mild cognitive impairment with memory loss   Postural dizziness with presyncope   S/P TKR (total knee replacement) using cement, right   Rhabdomyolysis   Acute kidney injury [facility])   She is a 75 year old female with history of hypertension, hyperlipidemia, anxiety/depression, pulmonary hypertension, asthma now admitted with history of acute mental status changes, rhabdomyolysis, sepsis and acute kidney injury.  #1: Acute kidney injury: Renal indicis have been improving somewhat.  The acute kidney injury is most likely secondary to acute tubular necrosis secondary to rhabdomyolysis versus septic ATN.  18 even the renal indicis are slightly better today with good urine output.  We will continue to monitor closely.  #2: Hypertension: Continue amlodipine at the present doses.  #3: Hyponatremia: Has improved significantly.  #4: Sepsis: Has significantly improved.  Continue cefazolin.  #5: Rhabdomyolysis: CPK has improved significantly from 14,000 to 600.  We will continue to monitor closely during the hospitalization.    LOS: Ghol[place], [facility] 11/12/202212:15 PM

## 2021-10-24 DIAGNOSIS — R531 Weakness: Secondary | ICD-10-CM | POA: Diagnosis not present

## 2021-10-24 DIAGNOSIS — A4159 Other Gram-negative sepsis: Secondary | ICD-10-CM | POA: Diagnosis not present

## 2021-10-24 DIAGNOSIS — N179 Acute kidney failure, unspecified: Secondary | ICD-10-CM | POA: Diagnosis not present

## 2021-10-24 DIAGNOSIS — M25561 Pain in right knee: Secondary | ICD-10-CM | POA: Diagnosis not present

## 2021-10-24 LAB — BASIC METABOLIC PANEL
Anion gap: 6 (ref 5–15)
BUN: 42 mg/dL — ABNORMAL HIGH (ref 8–23)
CO2: 25 mmol/L (ref 22–32)
Calcium: 8 mg/dL — ABNORMAL LOW (ref 8.9–10.3)
Chloride: 110 mmol/L (ref 98–111)
Creatinine, Ser: 2.13 mg/dL — ABNORMAL HIGH (ref 0.44–1.00)
GFR, Estimated: 24 mL/min — ABNORMAL LOW (ref >60)
Glucose, Bld: 120 mg/dL — ABNORMAL HIGH (ref 70–99)
Potassium: 3.2 mmol/L — ABNORMAL LOW (ref 3.5–5.1)
Sodium: 141 mmol/L (ref 135–145)

## 2021-10-24 MED ORDER — POTASSIUM CHLORIDE CRYS ER 20 MEQ PO TBCR
40.0000 meq | EXTENDED_RELEASE_TABLET | Freq: Once | ORAL | Status: AC
  Administered 2021-10-24: 09:00:00 40 meq via ORAL
  Filled 2021-10-24: qty 2

## 2021-10-24 MED ORDER — METOPROLOL SUCCINATE ER 25 MG PO TB24
25.0000 mg | ORAL_TABLET | Freq: Every day | ORAL | Status: DC
  Administered 2021-10-24 – 2021-10-25 (×2): 25 mg via ORAL
  Filled 2021-10-24 (×2): qty 1

## 2021-10-24 MED ORDER — LABETALOL HCL 5 MG/ML IV SOLN
10.0000 mg | Freq: Once | INTRAVENOUS | Status: AC
  Administered 2021-10-24: 03:00:00 10 mg via INTRAVENOUS
  Filled 2021-10-24: qty 4

## 2021-10-24 NOTE — Plan of Care (Signed)

## 2021-10-24 NOTE — Progress Notes (Signed)
[facility]  PROGRESS NOTE   Subjective:   Patient is out of bed to chair. Feels much better today. Appetite improved.  Objective:  Vital signs in last 24 hours:  Temp:  [97.5 F (36.4 C)-99.2 F (37.3 C)] 98.8 F (37.1 C) (11/13 1128) Pulse Rate:  [69-95] 80 (11/13 1128) Resp:  [16-20] 16 (11/13 1128) BP: (134-181)/(57-79) 150/71 (11/13 1128) SpO2:  [90 %-97 %] 94 % (11/13 1128)  Weight change:  Filed Weights   10/18/21 0853 10/19/21 1500  Weight: 72.6 kg 86.2 kg    Intake/Output: I/O last 3 completed shifts: In: 1703.4 [P.O.:240; I.V.:1413.4; IV Piggyback:50] Out: 300 [Urine:300]   Intake/Output this shift:  No intake/output data recorded.  Physical Exam: General:  No acute distress  Head:  Normocephalic, atraumatic. Moist oral mucosal membranes  Eyes:  Anicteric  Neck:  Supple  Lungs:   Clear to auscultation, normal effort  Heart:  S1S2 no rubs  Abdomen:   Soft, nontender, bowel sounds present  Extremities:  peripheral edema.  Neurologic:  Awake, alert, following commands  Skin:  No lesions  Access:     Basic Metabolic Panel: Recent Labs  Lab 10/18/21 1256 10/19/21 0439 10/20/21 0257 10/21/21 0715 10/22/21 0547 10/23/21 0405 10/24/21 0356  NA  --    < > 133* 134* 137 138 141  K  --    < > 4.2 3.4* 4.0 3.7 3.2*  CL  --    < > 103 105 106 108 110  CO2  --    < > 20* 21* 21* 23 25  GLUCOSE  --    < > 135* 107* 98 112* 120*  BUN  --    < > 67* 59* 54* 46* 42*  CREATININE  --    < > 3.78* 3.90* 3.48* 2.63* 2.13*  CALCIUM  --    < > 8.1* 8.2* 8.6* 8.3* 8.0*  MG 1.9  --   --   --   --   --   --   PHOS 3.9  --   --   --   --   --   --    < > = values in this interval not displayed.    CBC: Recent Labs  Lab 10/18/21 0903 10/19/21 0439 10/20/21 0257 10/23/21 0405  WBC 12.9* 9.9 9.5 7.8  NEUTROABS  --  7.9*  --   --   HGB 14.3 12.5 11.6* 10.8*  HCT 42.1 38.4 34.2* 32.7*  MCV 88.3 91.6 89.3 90.3  PLT 131* 106* 98* 180      Urinalysis: No results for input(s): COLORURINE, LABSPEC, PHURINE, GLUCOSEU, HGBUR, BILIRUBINUR, KETONESUR, PROTEINUR, UROBILINOGEN, NITRITE, LEUKOCYTESUR in the last 72 hours.  Invalid input(s): APPERANCEUR    Imaging: No results found.   Medications:     ceFAZolin (ANCEF) IV 1 g (10/24/21 0856)    amLODipine  10 mg Oral Daily   busPIRone  10 mg Oral BID   cholecalciferol  1,000 Units Oral Daily   citalopram  40 mg Oral Daily   fluticasone furoate-vilanterol  1 puff Inhalation Daily   And   umeclidinium bromide  1 puff Inhalation Daily   heparin  5,000 Units Subcutaneous Q8H   lidocaine  1 patch Transdermal Q24H   loratadine  10 mg Oral Daily   traZODone  100 mg Oral QHS   vitamin B-12  1,000 mcg Oral Daily    Assessment/ Plan:     Principal Problem:   Sepsis due to  Proteus species ([place]) Active Problems:   Anemia, iron deficiency   Weakness   Benign essential tremor   Anxiety, generalized   HLD (hyperlipidemia)   Depression, major, recurrent, in partial remission (HCC)   B12 deficiency   Mild cognitive impairment with memory loss   Postural dizziness with presyncope   S/P TKR (total knee replacement) using cement, right   Rhabdomyolysis   Acute kidney injury (HCC)   Leukocytosis   Acute pain of right knee   Elevated LFTs   Cyst of left ovary  She is a 75 year old female with history of hypertension, hyperlipidemia, anxiety/depression, pulmonary hypertension, asthma now admitted with history of acute mental status changes, rhabdomyolysis, sepsis and acute kidney injury.   #1: Acute kidney injury: Renal indicis have been improving somewhat.  The acute kidney injury is most likely secondary to acute tubular necrosis secondary to rhabdomyolysis versus septic ATN.  We will continue to monitor closely.   #2: Hypertension: Continue amlodipine at the present doses.   #3: Hyponatremia: Has improved significantly.   #4: Sepsis: Has significantly improved.   Continue cefazolin.   #5: Rhabdomyolysis: CPK has improved significantly from 14,000 to 600.  #6: Hypokalemia: We will supplement today.   We will continue to monitor closely during the hospitalization.   LOS: [place], [facility] 11/13/202212:37 PM

## 2021-10-24 NOTE — Progress Notes (Signed)
Patient ID: Nancy Blackburn, female   DOB: [birth year], 75 y.o.   MRN: [id number] [facility] PROGRESS NOTE  PEGI MILAZZO OHY:[id number] DOB: 1946-03-21 DOA: 10/18/2021 PCP: Jerrol Banana., MD  HPI/Subjective: Patient still having some right knee pain.  Still having a little difficulty getting around.  Feeling okay.  No shortness of breath.  Kidney function improving.  Admitted with sepsis.  Objective: Vitals:   10/24/21 0417 10/24/21 0814  BP: (!) 134/57 (!) 162/79  Pulse: 69 84  Resp: 16 16  Temp: (!) 97.5 F (36.4 C) 98 F (36.7 C)  SpO2: 90% 93%     Filed Weights   10/18/21 0853 10/19/21 1500  Weight: 72.6 kg 86.2 kg    ROS: Review of Systems  Respiratory:  Negative for shortness of breath.   Cardiovascular:  Negative for chest pain.  Gastrointestinal:  Negative for abdominal pain.  Musculoskeletal:  Positive for joint pain.  Exam: Physical Exam HENT:     Head: Normocephalic.     Mouth/Throat:     Pharynx: No oropharyngeal exudate.  Eyes:     General: Lids are normal.     Conjunctiva/sclera: Conjunctivae normal.  Cardiovascular:     Rate and Rhythm: Normal rate and regular rhythm.     Heart sounds: Normal heart sounds, S1 normal and S2 normal.  Pulmonary:     Breath sounds: No decreased breath sounds, wheezing, rhonchi or rales.  Abdominal:     Palpations: Abdomen is soft.     Tenderness: There is no abdominal tenderness.  Musculoskeletal:     Right lower leg: No swelling.     Left lower leg: No swelling.     Comments: Better passive range of motion right knee today.  Skin:    General: Skin is warm.     Findings: No rash.  Neurological:     Mental Status: She is alert and oriented to person, place, and time.      Scheduled Meds:  amLODipine  10 mg Oral Daily   busPIRone  10 mg Oral BID   cholecalciferol  1,000 Units Oral Daily   citalopram  40 mg Oral Daily   fluticasone furoate-vilanterol  1 puff Inhalation Daily   And    umeclidinium bromide  1 puff Inhalation Daily   heparin  5,000 Units Subcutaneous Q8H   lidocaine  1 patch Transdermal Q24H   loratadine  10 mg Oral Daily   traZODone  100 mg Oral QHS   vitamin B-12  1,000 mcg Oral Daily   Continuous Infusions:   ceFAZolin (ANCEF) IV 1 g (10/24/21 0856)    Assessment/Plan:  Weakness.  Right knee pain from fall.  Physical therapy recommending rehab.  Hopeful to get a bed tomorrow to go out to rehab. Severe sepsis with Proteus growing out of blood cultures.  Present on admission.  Also had acute kidney injury, hypotension acute metabolic encephalopathy.  Initially had tachycardia, Tachypnea and leukocytosis.  Currently on IV Ancef and will switch over to oral antibiotics upon disposition through 10/28/2021. Acute kidney injury from rhabdomyolysis and initial hypotension from sepsis.  Creatinine peaked at 3.9.  Creatinine down to 2.13 today. Acute rhabdomyolysis from fall.  CPK peaked at 15,867 and down to 613 the other day. Elevated liver function test secondary to sepsis.  LFTs trending better.  Yesterday AST 53 and ALT 71 Acute metabolic encephalopathy on presentation.  Mental status much improved at this point Parkinson's disease Essential hypertension on increased dose of Norvasc 10  mg Asthma on inhalers Left adnexa with mildly complex cystic lesion.  Follow-up with gynecology as outpatient.  Follow-up on [place] blood test.        Code Status:     Code Status Orders  (From admission, onward)           Start     Ordered   10/18/21 1220  Full code  Continuous        10/18/21 1223           Code Status History     Date Active Date Inactive Code Status Order ID Comments User Context   03/09/2021 1232 03/12/2021 2121 Full Code [id number]  Thornton Park, MD Inpatient   07/01/2020 2328 07/03/2020 2053 Full Code [id number]  Mansy, Arvella Merles, MD ED      Advance Directive Documentation    Flowsheet Row Most Recent Value  Type of Advance  Directive Healthcare Power of Attorney, Living will  Pre-existing out of facility DNR order (yellow form or pink MOST form) --  "MOST" Form in Place? --      Family Communication: Updated family yesterday Disposition Plan: Status is: Inpatient  Antibiotics: Ancef  Time spent: 26 minutes  [place]

## 2021-10-25 DIAGNOSIS — N12 Tubulo-interstitial nephritis, not specified as acute or chronic: Secondary | ICD-10-CM | POA: Diagnosis not present

## 2021-10-25 DIAGNOSIS — A4159 Other Gram-negative sepsis: Secondary | ICD-10-CM | POA: Diagnosis not present

## 2021-10-25 DIAGNOSIS — N179 Acute kidney failure, unspecified: Secondary | ICD-10-CM | POA: Diagnosis not present

## 2021-10-25 DIAGNOSIS — M25561 Pain in right knee: Secondary | ICD-10-CM | POA: Diagnosis not present

## 2021-10-25 DIAGNOSIS — R7881 Bacteremia: Secondary | ICD-10-CM | POA: Diagnosis not present

## 2021-10-25 DIAGNOSIS — R531 Weakness: Secondary | ICD-10-CM | POA: Diagnosis not present

## 2021-10-25 LAB — BASIC METABOLIC PANEL
Anion gap: 8 (ref 5–15)
BUN: 31 mg/dL — ABNORMAL HIGH (ref 8–23)
CO2: 27 mmol/L (ref 22–32)
Calcium: 8.2 mg/dL — ABNORMAL LOW (ref 8.9–10.3)
Chloride: 108 mmol/L (ref 98–111)
Creatinine, Ser: 1.6 mg/dL — ABNORMAL HIGH (ref 0.44–1.00)
GFR, Estimated: 33 mL/min — ABNORMAL LOW (ref >60)
Glucose, Bld: 106 mg/dL — ABNORMAL HIGH (ref 70–99)
Potassium: 3.7 mmol/L (ref 3.5–5.1)
Sodium: 143 mmol/L (ref 135–145)

## 2021-10-25 LAB — RESP PANEL BY RT-PCR (FLU A&B, COVID) ARPGX2
Influenza A by PCR: NEGATIVE
Influenza B by PCR: NEGATIVE
SARS Coronavirus 2 by RT PCR: NEGATIVE

## 2021-10-25 LAB — CK: Total CK: 181 U/L (ref 38–234)

## 2021-10-25 MED ORDER — CEFAZOLIN SODIUM-DEXTROSE 2-4 GM/100ML-% IV SOLN
2.0000 g | Freq: Three times a day (TID) | INTRAVENOUS | Status: DC
  Administered 2021-10-25 – 2021-10-26 (×3): 2 g via INTRAVENOUS
  Filled 2021-10-25 (×4): qty 100

## 2021-10-25 MED ORDER — CEFAZOLIN SODIUM-DEXTROSE 1-4 GM/50ML-% IV SOLN
1.0000 g | Freq: Once | INTRAVENOUS | Status: DC
  Filled 2021-10-25: qty 50

## 2021-10-25 MED ORDER — METOPROLOL SUCCINATE ER 25 MG PO TB24: 12.5000 mg | ORAL_TABLET | Freq: Every day | ORAL | Status: DC

## 2021-10-25 NOTE — Care Management Important Message (Signed)
Important Message  Patient Details  Name: Nancy Blackburn MRN: [id number] Date of Birth: 05/03/1946   Medicare Important Message Given:  Yes     [name] 10/25/2021, 3:11 PM

## 2021-10-25 NOTE — TOC Progression Note (Signed)
Transition of Care [facility]) - Progression Note    Patient Details  Name: Nancy Blackburn MRN: [id number] Date of Birth: 08-07-1946  Transition of Care [facility]) CM/SW [place], RN Phone Number: 10/25/2021, 1:40 PM  Clinical Narrative:   [facility] contacted for admission today.  They do not have patient on their list.  Plan for discharge to [facility] tomorrow.  TOC to follow.    Expected Discharge Plan: [place] Barriers to Discharge: Continued Medical Work up  Expected Discharge Plan and Services Expected Discharge Plan: [place] In-house Referral: NA Discharge Planning Services: CM Consult, Follow-up appt scheduled ([facility] outpatient PT) Post Acute Care Choice: NA (patient has walker, shower chair and bsc) Living arrangements for the past 2 months: Single Family Home                 DME Arranged:  (none required)         HH Arranged:  (Patient chooses outpatient PT)           Social Determinants of Health (SDOH) Interventions    Readmission Risk Interventions No flowsheet data found.

## 2021-10-25 NOTE — Progress Notes (Signed)
ID Pt in chair Says she is feeling better Rt knee is painful and stiff and she thinks it is due to her being not mobile like before No fever   Objective BP (!) 120/108 (BP Location: Left Arm)   Pulse 87   Temp 98.6 F (37 C)   Resp 18   Ht 5\' 7"  (1.702 m)   Wt 86.2 kg   SpO2 93%   BMI 29.76 kg/m    Awake and alert and oriented Chest CTA Hss1s2 CNS non focal Rt knee mild swelling No erythema or warmth Flexion restricted Surgical scar rt  healthy Rt leg above ankle medially- I cannot feel the bladder stimulator   Labs CBC Latest Ref Rng & Units 10/23/2021 10/20/2021 10/19/2021  WBC 4.0 - 10.5 K/uL 7.8 9.5 9.9  Hemoglobin 12.0 - 15.0 g/dL 10.8(L) 11.6(L) 12.5  Hematocrit 36.0 - 46.0 % 32.7(L) 34.2(L) 38.4  Platelets 150 - 400 K/uL 180 98(L) 106(L)     CMP Latest Ref Rng & Units 10/25/2021 10/24/2021 10/23/2021  Glucose 70 - 99 mg/dL 106(H) 120(H) 112(H)  BUN 8 - 23 mg/dL 31(H) 42(H) 46(H)  Creatinine 0.44 - 1.00 mg/dL 1.60(H) 2.13(H) 2.63(H)  Sodium 135 - 145 mmol/L 143 141 138  Potassium 3.5 - 5.1 mmol/L 3.7 3.2(L) 3.7  Chloride 98 - 111 mmol/L 108 110 108  CO2 22 - 32 mmol/L 27 25 23   Calcium 8.9 - 10.3 mg/dL 8.2(L) 8.0(L) 8.3(L)  Total Protein 6.5 - 8.1 g/dL - - 4.7(L)  Total Bilirubin 0.3 - 1.2 mg/dL - - 0.4  Alkaline Phos 38 - 126 U/L - - 163(H)  AST 15 - 41 U/L - - 53(H)  ALT 0 - 44 U/L - - 71(H)      Micro 10/18/21- BC- proteus 10/19/21 urine culture NG   Assemment/plan  Acute encephalopathy- metabolic- resolved  Proteus bacteremia with complicated UTI- Left pyelonephritis in the imaging Also has non obstructing left renal calculi Was on ceftriaxone  - which was changed to cefazolin- antibiotic until 10/28/21- on discharge can be switched to cefadroxil  Rt knee TKA- no clear evidence of septic PJI- pain from fall and not being active- minimal swelling with no other features of infection.Seen by Dr.Krasinski who did not think aspiration was  needed once she completes antibiotic  She will have to follow up with Dr.Krasinski  AKI secondary to infection/rhabdomyolysis AKI much improved crcl 33  Rhabdomyolysis due to fall- much improved  Urinary incontinence - has bladder stim Followed by urologist   HTN on amlodipine  Depression -On buspar, celexa, trazadone and lorezapam PRN for anxiety  Discussed the management with the patient  ID will sign off- call if needed

## 2021-10-25 NOTE — Progress Notes (Signed)
[facility]  PROGRESS NOTE   Subjective:   Creatinine 1.6 (2.13).   No complaints. Seated in chair.    Objective:  Vital signs in last 24 hours:  Temp:  [98 F (36.7 C)-98.7 F (37.1 C)] 98 F (36.7 C) (11/14 1100) Pulse Rate:  [68-88] 68 (11/14 1100) Resp:  [16-20] 18 (11/14 1100) BP: (140-179)/(69-83) 140/69 (11/14 1100) SpO2:  [91 %-98 %] 98 % (11/14 1100)  Weight change:  Filed Weights   10/18/21 0853 10/19/21 1500  Weight: 72.6 kg 86.2 kg    Intake/Output: I/O last 3 completed shifts: In: 480 [P.O.:480] Out: -    Intake/Output this shift:  Total I/O In: 120 [P.O.:120] Out: -   Physical Exam: General:  No acute distress, sitting in chair  Head:  Normocephalic, atraumatic. Moist oral mucosal membranes  Eyes:  Anicteric  Neck:  Supple  Lungs:   Clear to auscultation, normal effort  Heart:  regular  Abdomen:   Soft, nontender, bowel sounds present  Extremities:  No peripheral edema.  Neurologic:  Awake, alert, following commands  Skin:  No lesions        Basic Metabolic Panel: Recent Labs  Lab 10/21/21 0715 10/22/21 0547 10/23/21 0405 10/24/21 0356 10/25/21 0403  NA 134* 137 138 141 143  K 3.4* 4.0 3.7 3.2* 3.7  CL 105 106 108 110 108  CO2 21* 21* 23 25 27   GLUCOSE 107* 98 112* 120* 106*  BUN 59* 54* 46* 42* 31*  CREATININE 3.90* 3.48* 2.63* 2.13* 1.60*  CALCIUM 8.2* 8.6* 8.3* 8.0* 8.2*     CBC: Recent Labs  Lab 10/19/21 0439 10/20/21 0257 10/23/21 0405  WBC 9.9 9.5 7.8  NEUTROABS 7.9*  --   --   HGB 12.5 11.6* 10.8*  HCT 38.4 34.2* 32.7*  MCV 91.6 89.3 90.3  PLT 106* 98* 180      Urinalysis: No results for input(s): COLORURINE, LABSPEC, PHURINE, GLUCOSEU, HGBUR, BILIRUBINUR, KETONESUR, PROTEINUR, UROBILINOGEN, NITRITE, LEUKOCYTESUR in the last 72 hours.  Invalid input(s): APPERANCEUR    Imaging: No results found.   Medications:     ceFAZolin (ANCEF) IV     Followed by    ceFAZolin (ANCEF) IV       amLODipine  10 mg Oral Daily   busPIRone  10 mg Oral BID   cholecalciferol  1,000 Units Oral Daily   citalopram  40 mg Oral Daily   fluticasone furoate-vilanterol  1 puff Inhalation Daily   And   umeclidinium bromide  1 puff Inhalation Daily   heparin  5,000 Units Subcutaneous Q8H   lidocaine  1 patch Transdermal Q24H   loratadine  10 mg Oral Daily   metoprolol succinate  25 mg Oral Daily   traZODone  100 mg Oral QHS   vitamin B-12  1,000 mcg Oral Daily    Assessment/ Plan:     Principal Problem:   Sepsis due to Proteus species (HCC) Active Problems:   Anemia, iron deficiency   Weakness   Benign essential tremor   Anxiety, generalized   HLD (hyperlipidemia)   Depression, major, recurrent, in partial remission (HCC)   B12 deficiency   Mild cognitive impairment with memory loss   Postural dizziness with presyncope   S/P TKR (total knee replacement) using cement, right   Rhabdomyolysis   Acute kidney injury ([place])   Leukocytosis   Acute pain of right knee   Elevated LFTs   Cyst of left ovary  Nancy Blackburn is a  75 y.o. white female with hypertension, hyperlipidemia, anxiety/depression, pulmonary hypertension, asthma admitted to [facility] on 10/18/2021 for Acute kidney injury ([place]) [N17.9] Severe sepsis with acute organ dysfunction ([place]) [A41.9, R65.20] Non-traumatic rhabdomyolysis [M62.82] Altered mental status, unspecified altered mental status type [R41.82] Sepsis with acute renal failure without septic shock, due to unspecified organism, unspecified acute renal failure type ([place]) [A41.9, R65.20, N17.9]   #1: Acute kidney injury: baseline creatinine of 0.84 with normal GFR on 07/29/21. History of bland urine.  Acute kidney injury secondary to ATN, sepsis and rhabdomyolysis.    #2: Hypertension: 143/69.  Continue amlodipine 5mg  daily which his her home dose.    #3: Hyponatremia: improved, sodium of 143 today.    #4: Sepsis: blood culture proteus mirabilis 11/7.   Cefazolin.    #5: Rhabdomyolysis: CPK has improved significantly.  Nephrology will sign off. Patient will need outpatient follow up with nephrology in 1-2 months   LOS: [place], [facility] 11/14/20221:29 PM

## 2021-10-25 NOTE — Progress Notes (Signed)
Occupational Therapy Treatment Patient Details Name: Nancy Blackburn MRN: [id number] DOB: 1946-06-06 Today's Date: 10/25/2021   History of present illness Patient is alert and awake.  She came in not feeling well.  Started on Sunday night.  Was found on Monday morning.  She was on the floor for an undetermined amount of time.  Admitted with sepsis, acute kidney injury and rhabdomyolysis. Patient with recent knee replacement and is having severe pain in right knee.   OT comments  Pt seen for brief OT tx. Pt endorsing nausea and requesting RN to address. OT notified RN who came at end of session. Pt politely declined EOB/OOB ADL 2/2 nausea. Agreeable to education/training in ECS given that she endorses feeling fatigued more quickly than before. Pt instructed in AE/DME and energy conservation strategies to support bathing, dressing, laundry/cleaning, and meal prep at home. Pt verbalized understanding and plan to use shower chair for seated showering upon return home initially. Pt continues to progress towards goals, but continues to be limited by impairments and would benefit from additional skilled OT services.    Recommendations for follow up therapy are one component of a multi-disciplinary discharge planning process, led by the attending physician.  Recommendations may be updated based on patient status, additional functional criteria and insurance authorization.    Follow Up Recommendations  Home health OT    Assistance Recommended at Discharge Intermittent Supervision/Assistance  Equipment Recommendations  None recommended by OT    Recommendations for Other Services      Precautions / Restrictions Precautions Precautions: Fall Restrictions Weight Bearing Restrictions: No       Mobility Bed Mobility      General bed mobility comments: pt declines, states she has been up several times this morning and endorses significant nausea, RN notified                      Balance                            ADL either performed or assessed with clinical judgement   ADL                                              Extremity/Trunk Assessment              Vision       Perception     Praxis      Cognition Arousal/Alertness: Awake/alert Behavior During Therapy: WFL for tasks assessed/performed Overall Cognitive Status: Within Functional Limits for tasks assessed                                            Exercises Other Exercises Other Exercises: Pt instructed in AE/DME and energy conservation strategies to support bathing, dressing, laundry/cleaning, and meal prep at home   Shoulder Instructions       General Comments      Pertinent Vitals/ Pain       Pain Assessment: No/denies pain  Home Living  Prior Functioning/Environment              Frequency  Min 2X/week        Progress Toward Goals  OT Goals(current goals can now be found in the care plan section)  Progress towards OT goals: Progressing toward goals  Acute Rehab OT Goals Patient Stated Goal: to get stronger OT Goal Formulation: With patient Time For Goal Achievement: 11/03/21 Potential to Achieve Goals: Good  Plan Discharge plan remains appropriate;Frequency remains appropriate    Co-evaluation                 AM-PAC OT "6 Clicks" Daily Activity     Outcome Measure   Help from another person eating meals?: None Help from another person taking care of personal grooming?: A Little Help from another person toileting, which includes using toliet, bedpan, or urinal?: A Little Help from another person bathing (including washing, rinsing, drying)?: A Little Help from another person to put on and taking off regular upper body clothing?: A Little Help from another person to put on and taking off regular lower body clothing?: A Little 6 Click  Score: 19    End of Session    OT Visit Diagnosis: Unsteadiness on feet (R26.81);Muscle weakness (generalized) (M62.81)   Activity Tolerance Other (comment) (nausea)   Patient Left in bed;with call bell/phone within reach;with nursing/sitter in room   Nurse Communication Other (comment) (nausea)        Time: 5883-2549 OT Time Calculation (min): 9 min  Charges: OT General Charges $OT Visit: 1 Visit OT Treatments $Self Care/Home Management : 8-22 mins  Ardeth Perfect., MPH, MS, OTR/L ascom [phone] 10/25/21, 1:22 PM

## 2021-10-25 NOTE — Progress Notes (Signed)
Physical Therapy Treatment Patient Details Name: Nancy Blackburn MRN: [id number] DOB: 23-Mar-1946 Today's Date: 10/25/2021   History of Present Illness Patient is alert and awake.  She came in not feeling well.  Started on Sunday night.  Was found on Monday morning.  She was on the floor for an undetermined amount of time.  Admitted with sepsis, acute kidney injury and rhabdomyolysis. Patient with recent knee replacement and is having severe pain in right knee.    PT Comments    Pt ready for session.  Stated she was able to walk lap on unit with nursing staff yesterday.  Participated in exercises as described below.`  She fatigues quickly with ex only doing about 5 reps each before too fatigued to continue on her own. OOB with rail and min guard.  She is able to stand, walk to bathroom to void and complete self care then walk 100' in hallway with RW and min guard.  She fatigues from activity and does not feel as though she can complete lap today.  While no LOB or buckling, gait remains generally slow and "weak" limited by fatigue.  Discussed discharge plan with pt.  She has limited assist during the day and given continued weakness and poor tolerance for activity, she would struggle with self care tasks during they day.  SNF remains appropriate to increase overall strength and independence for a successful discharge home.  Pt in agreement.   Recommendations for follow up therapy are one component of a multi-disciplinary discharge planning process, led by the attending physician.  Recommendations may be updated based on patient status, additional functional criteria and insurance authorization.  Follow Up Recommendations  Skilled nursing-short term rehab (<3 hours/day)     Assistance Recommended at Discharge Intermittent Supervision/Assistance  Equipment Recommendations  None recommended by PT    Recommendations for Other Services       Precautions / Restrictions  Precautions Precautions: Fall Restrictions Weight Bearing Restrictions: No     Mobility  Bed Mobility Overal bed mobility: Needs Assistance Bed Mobility: Supine to Sit     Supine to sit: Min guard     General bed mobility comments: with rails    Transfers Overall transfer level: Needs assistance Equipment used: Rolling walker (2 wheels) Transfers: Sit to/from Stand Sit to Stand: Min guard                Ambulation/Gait Ambulation/Gait assistance: Min guard Gait Distance (Feet): 100 Feet Assistive device: Rolling walker (2 wheels) Gait Pattern/deviations: Step-through pattern;Decreased step length - right;Decreased step length - left Gait velocity: decreased         Stairs             Wheelchair Mobility    Modified Rankin (Stroke Patients Only)       Balance Overall balance assessment: Needs assistance Sitting-balance support: Feet supported Sitting balance-Leahy Scale: Good     Standing balance support: Bilateral upper extremity supported;During functional activity Standing balance-Leahy Scale: Fair                              Cognition Arousal/Alertness: Awake/alert Behavior During Therapy: WFL for tasks assessed/performed Overall Cognitive Status: Within Functional Limits for tasks assessed  Exercises Other Exercises Other Exercises: supine ex x 5  - fatigues quickly with attempts    General Comments        Pertinent Vitals/Pain Pain Assessment: No/denies pain    Home Living                          Prior Function            PT Goals (current goals can now be found in the care plan section) Progress towards PT goals: Progressing toward goals    Frequency    Min 2X/week      PT Plan Current plan remains appropriate    Co-evaluation              AM-PAC PT "6 Clicks" Mobility   Outcome Measure  Help needed turning from your  back to your side while in a flat bed without using bedrails?: A Little Help needed moving from lying on your back to sitting on the side of a flat bed without using bedrails?: A Little Help needed moving to and from a bed to a chair (including a wheelchair)?: A Little Help needed standing up from a chair using your arms (e.g., wheelchair or bedside chair)?: A Little Help needed to walk in hospital room?: A Little Help needed climbing 3-5 steps with a railing? : A Little 6 Click Score: 18    End of Session Equipment Utilized During Treatment: Gait belt Activity Tolerance: Patient tolerated treatment well;Patient limited by fatigue Patient left: in chair;with call bell/phone within reach;with chair alarm set Nurse Communication: Mobility status PT Visit Diagnosis: Unsteadiness on feet (R26.81);Other abnormalities of gait and mobility (R26.89);Pain Pain - Right/Left: Right Pain - part of body: Knee     Time: 3536-1443 PT Time Calculation (min) (ACUTE ONLY): 24 min  Charges:  $Gait Training: 8-22 mins $Therapeutic Activity: 8-22 mins                    Chesley Noon, PTA 10/25/21, 10:34 AM

## 2021-10-25 NOTE — Progress Notes (Signed)
PHARMACY NOTE:  ANTIMICROBIAL RENAL DOSAGE ADJUSTMENT  Current antimicrobial regimen includes a mismatch between antimicrobial dosage and estimated renal function.  As per policy approved by the Pharmacy & Therapeutics and Medical Executive Committees, the antimicrobial dosage will be adjusted accordingly.  Current antimicrobial dosage:  cefazolin 1 gram every 12 hours  Indication: bacteremia (Proteus mirabilis)  Renal Function: Improved AKI, CrCl now > 30.  Estimated Creatinine Clearance: 33.7 mL/min (A) (by C-G formula based on SCr of 1.6 mg/dL (H)). []      On intermittent HD, scheduled: []      On CRRT    Antimicrobial dosage has been changed to:  cefazolin 2 grams every 8 hours  Additional comments:   Thank you for allowing pharmacy to be a part of this patient's care.  Wynelle Cleveland, PharmD Pharmacy Resident  10/25/2021 1:13 PM

## 2021-10-25 NOTE — Progress Notes (Signed)
Patient ID: Nancy Blackburn, female   DOB: 1946-02-17, 75 y.o.   MRN: [id number] [facility] PROGRESS NOTE  Nancy Blackburn OVF:[id number] DOB: February 09, 1946 DOA: 10/18/2021 PCP: Jerrol Banana., MD  HPI/Subjective: Patient still feeling weak.  Moving her right knee around a little bit better.  Unfortunately rehab unable to take today.  Initially admitted with sepsis.  Objective: Vitals:   10/25/21 0752 10/25/21 1100  BP: (!) 179/83 140/69  Pulse: 86 68  Resp: 18 18  Temp: 98.7 F (37.1 C) 98 F (36.7 C)  SpO2: 91% 98%    Intake/Output Summary (Last 24 hours) at 10/25/2021 1337 Last data filed at 10/25/2021 1025 Gross per 24 hour  Intake 600 ml  Output --  Net 600 ml   Filed Weights   10/18/21 0853 10/19/21 1500  Weight: 72.6 kg 86.2 kg    ROS: Review of Systems  Respiratory:  Negative for shortness of breath.   Cardiovascular:  Negative for chest pain.  Gastrointestinal:  Negative for abdominal pain, nausea and vomiting.  Musculoskeletal:  Positive for joint pain.  Exam: Physical Exam HENT:     Head: Normocephalic.     Mouth/Throat:     Pharynx: No oropharyngeal exudate.  Eyes:     General: Lids are normal.     Conjunctiva/sclera: Conjunctivae normal.  Cardiovascular:     Rate and Rhythm: Normal rate and regular rhythm.     Heart sounds: Normal heart sounds, S1 normal and S2 normal.  Pulmonary:     Breath sounds: Normal breath sounds. No decreased breath sounds, wheezing, rhonchi or rales.  Abdominal:     Palpations: Abdomen is soft.     Tenderness: There is no abdominal tenderness.  Musculoskeletal:     Right lower leg: Swelling present.     Left lower leg: Swelling present.     Comments: Passive range of motion right knee still limited but better range of motion today than a few days ago.  Skin:    General: Skin is warm.     Findings: No rash.  Neurological:     Mental Status: She is alert and oriented to person, place, and time.       Scheduled Meds:  amLODipine  10 mg Oral Daily   busPIRone  10 mg Oral BID   cholecalciferol  1,000 Units Oral Daily   citalopram  40 mg Oral Daily   fluticasone furoate-vilanterol  1 puff Inhalation Daily   And   umeclidinium bromide  1 puff Inhalation Daily   heparin  5,000 Units Subcutaneous Q8H   lidocaine  1 patch Transdermal Q24H   loratadine  10 mg Oral Daily   metoprolol succinate  25 mg Oral Daily   traZODone  100 mg Oral QHS   vitamin B-12  1,000 mcg Oral Daily   Continuous Infusions:   ceFAZolin (ANCEF) IV     Followed by    ceFAZolin (ANCEF) IV      Assessment/Plan:  Weakness and right knee pain from fall.  Physical therapy still recommending rehab.  Unfortunately rehab facility will be able to take till tomorrow.  Repeat COVID test negative. Severe sepsis with Proteus growing out of blood cultures.  Present on admission.  On presentation also had acute kidney injury, hypotension, acute metabolic encephalopathy, tachycardia, tachypnea and leukocytosis.  Currently on IV Ancef and will be switched over to oral Keflex upon discharge. Acute kidney injury from rhabdomyolysis and initial hypotension from sepsis.  Creatinine peaked at 3.9.  Down to 1.6 today. Elevated liver function test secondary to sepsis Acute metabolic encephalopathy on presentation.  Much improved.  Mental status back to baseline Essential hypertension.  On increased dose of Norvasc.  I did add low-dose Toprol-XL but have to be careful since she did have bradycardia with beta-blocker in the past.  We will decrease dose of Toprol-XL even further.  If kidney function improves further may be able to use an ACE or an ARB instead. Left adnexa with mildly complex cystic lesion.  Follow-up abnormal blood test.  Follow-up with gynecology as outpatient. Parkinson's disease        Code Status:     Code Status Orders  (From admission, onward)           Start     Ordered   10/18/21 1220  Full  code  Continuous        10/18/21 1223           Code Status History     Date Active Date Inactive Code Status Order ID Comments User Context   03/09/2021 1232 03/12/2021 2121 Full Code [id number]  Thornton [place], MD Inpatient   07/01/2020 2328 07/03/2020 2053 Full Code [id number]  Mansy, Arvella Merles, MD ED      Advance Directive Documentation    Flowsheet Row Most Recent Value  Type of Advance Directive Healthcare Power of Attorney, Living will  Pre-existing out of facility DNR order (yellow form or pink MOST form) --  "MOST" Form in Place? --      Family Communication: Left message for patient's daughter-in-law Disposition Plan: Status is: Inpatient  Antibiotics: Ancef  Time spent: 27 minutes  [place]

## 2021-10-26 DIAGNOSIS — F03A0 Unspecified dementia, mild, without behavioral disturbance, psychotic disturbance, mood disturbance, and anxiety: Secondary | ICD-10-CM | POA: Diagnosis not present

## 2021-10-26 DIAGNOSIS — F32A Depression, unspecified: Secondary | ICD-10-CM | POA: Diagnosis not present

## 2021-10-26 DIAGNOSIS — S37009A Unspecified injury of unspecified kidney, initial encounter: Secondary | ICD-10-CM | POA: Diagnosis not present

## 2021-10-26 DIAGNOSIS — A419 Sepsis, unspecified organism: Secondary | ICD-10-CM | POA: Diagnosis not present

## 2021-10-26 DIAGNOSIS — D519 Vitamin B12 deficiency anemia, unspecified: Secondary | ICD-10-CM | POA: Diagnosis not present

## 2021-10-26 DIAGNOSIS — R7989 Other specified abnormal findings of blood chemistry: Secondary | ICD-10-CM | POA: Diagnosis not present

## 2021-10-26 DIAGNOSIS — A4159 Other Gram-negative sepsis: Secondary | ICD-10-CM | POA: Diagnosis not present

## 2021-10-26 DIAGNOSIS — R5381 Other malaise: Secondary | ICD-10-CM | POA: Diagnosis not present

## 2021-10-26 DIAGNOSIS — N83202 Unspecified ovarian cyst, left side: Secondary | ICD-10-CM | POA: Diagnosis not present

## 2021-10-26 DIAGNOSIS — R6520 Severe sepsis without septic shock: Secondary | ICD-10-CM | POA: Diagnosis not present

## 2021-10-26 DIAGNOSIS — G9341 Metabolic encephalopathy: Secondary | ICD-10-CM | POA: Diagnosis not present

## 2021-10-26 DIAGNOSIS — M25561 Pain in right knee: Secondary | ICD-10-CM | POA: Diagnosis not present

## 2021-10-26 DIAGNOSIS — Z743 Need for continuous supervision: Secondary | ICD-10-CM | POA: Diagnosis not present

## 2021-10-26 DIAGNOSIS — D72829 Elevated white blood cell count, unspecified: Secondary | ICD-10-CM | POA: Diagnosis not present

## 2021-10-26 DIAGNOSIS — R2681 Unsteadiness on feet: Secondary | ICD-10-CM | POA: Diagnosis not present

## 2021-10-26 DIAGNOSIS — I10 Essential (primary) hypertension: Secondary | ICD-10-CM | POA: Diagnosis not present

## 2021-10-26 DIAGNOSIS — F419 Anxiety disorder, unspecified: Secondary | ICD-10-CM | POA: Diagnosis not present

## 2021-10-26 DIAGNOSIS — R2689 Other abnormalities of gait and mobility: Secondary | ICD-10-CM | POA: Diagnosis not present

## 2021-10-26 DIAGNOSIS — R112 Nausea with vomiting, unspecified: Secondary | ICD-10-CM | POA: Diagnosis not present

## 2021-10-26 DIAGNOSIS — D649 Anemia, unspecified: Secondary | ICD-10-CM | POA: Diagnosis not present

## 2021-10-26 DIAGNOSIS — R531 Weakness: Secondary | ICD-10-CM | POA: Diagnosis not present

## 2021-10-26 DIAGNOSIS — N179 Acute kidney failure, unspecified: Secondary | ICD-10-CM | POA: Diagnosis not present

## 2021-10-26 DIAGNOSIS — R4182 Altered mental status, unspecified: Secondary | ICD-10-CM | POA: Diagnosis not present

## 2021-10-26 DIAGNOSIS — M6282 Rhabdomyolysis: Secondary | ICD-10-CM | POA: Diagnosis not present

## 2021-10-26 DIAGNOSIS — M6281 Muscle weakness (generalized): Secondary | ICD-10-CM | POA: Diagnosis not present

## 2021-10-26 DIAGNOSIS — E785 Hyperlipidemia, unspecified: Secondary | ICD-10-CM | POA: Diagnosis not present

## 2021-10-26 LAB — BASIC METABOLIC PANEL
Anion gap: 6 (ref 5–15)
BUN: 24 mg/dL — ABNORMAL HIGH (ref 8–23)
CO2: 29 mmol/L (ref 22–32)
Calcium: 8.2 mg/dL — ABNORMAL LOW (ref 8.9–10.3)
Chloride: 108 mmol/L (ref 98–111)
Creatinine, Ser: 1.3 mg/dL — ABNORMAL HIGH (ref 0.44–1.00)
GFR, Estimated: 43 mL/min — ABNORMAL LOW (ref >60)
Glucose, Bld: 110 mg/dL — ABNORMAL HIGH (ref 70–99)
Potassium: 3.5 mmol/L (ref 3.5–5.1)
Sodium: 143 mmol/L (ref 135–145)

## 2021-10-26 LAB — C-REACTIVE PROTEIN: CRP: 7.9 mg/dL — ABNORMAL HIGH (ref <1.0)

## 2021-10-26 LAB — SEDIMENTATION RATE: Sed Rate: 84 mm/hr — ABNORMAL HIGH (ref 0–30)

## 2021-10-26 MED ORDER — LORAZEPAM 0.5 MG PO TABS: 0.5000 mg | ORAL_TABLET | Freq: Three times a day (TID) | ORAL | 0 refills | Status: DC | PRN

## 2021-10-26 MED ORDER — BUSPIRONE HCL 10 MG PO TABS: 10.0000 mg | ORAL_TABLET | Freq: Two times a day (BID) | ORAL | 0 refills | Status: DC

## 2021-10-26 MED ORDER — HYDROCHLOROTHIAZIDE 12.5 MG PO TABS: 12.5000 mg | ORAL_TABLET | Freq: Every day | ORAL | 0 refills | Status: DC

## 2021-10-26 MED ORDER — VITAMIN D3 25 MCG PO TABS: 1000.0000 [IU] | ORAL_TABLET | Freq: Every day | ORAL | 0 refills | Status: DC

## 2021-10-26 MED ORDER — CEFADROXIL 500 MG PO CAPS: ORAL_CAPSULE | ORAL | 0 refills | Status: AC

## 2021-10-26 MED ORDER — AMLODIPINE BESYLATE 10 MG PO TABS: 10.0000 mg | ORAL_TABLET | Freq: Every day | ORAL | 0 refills | Status: DC

## 2021-10-26 MED ORDER — LIDOCAINE 5 % EX PTCH: 1.0000 | MEDICATED_PATCH | CUTANEOUS | 0 refills | Status: DC

## 2021-10-26 MED ORDER — HYDROCHLOROTHIAZIDE 12.5 MG PO TABS
12.5000 mg | ORAL_TABLET | Freq: Every day | ORAL | Status: DC
  Administered 2021-10-26: 12.5 mg via ORAL
  Filled 2021-10-26: qty 1

## 2021-10-26 MED ORDER — OXYCODONE HCL 5 MG PO TABS: 2.5000 mg | ORAL_TABLET | Freq: Four times a day (QID) | ORAL | 0 refills | Status: AC | PRN

## 2021-10-26 NOTE — TOC Progression Note (Signed)
Transition of Care [facility]) - Progression Note    Patient Details  Name: SIA GABRIELSEN MRN: [id number] Date of Birth: 10/17/1946  Transition [facility] CM/SW [place], RN Phone Number: 10/26/2021, 11:23 AM  Clinical Narrative:   Patient being discharged to [facility], [place], [place], room [address]A today as per [place] Patient will go via [facility], next on list.      Expected Discharge Plan: [place] Barriers to Discharge: Continued Medical Work up  Expected Discharge Plan and Services Expected Discharge Plan: [place] In-house Referral: NA Discharge Planning Services: CM Consult, Follow-up appt scheduled ([facility] outpatient PT) Post Acute Care Choice: NA (patient has walker, shower chair and bsc) Living arrangements for the past 2 months: [place] Expected Discharge Date: 10/26/21               DME Arranged:  (none required)         HH Arranged:  (Patient chooses outpatient PT)           Social Determinants of Health (SDOH) Interventions    Readmission Risk Interventions No flowsheet data found.

## 2021-10-26 NOTE — Progress Notes (Signed)
Tried to call report to [facility] rehab , 3 times called them , call forward to nurse, no answer. left msg to receptionist with work ph number

## 2021-10-26 NOTE — Discharge Summary (Signed)
[facility] [place] NAME: Nancy Blackburn    MR#:  [id number]  DATE OF BIRTH:  1946/09/14  DATE OF ADMISSION:  10/18/2021 ADMITTING PHYSICIAN: Amy N Cox, DO  DATE OF DISCHARGE: 10/26/2021  PRIMARY CARE PHYSICIAN: Jerrol Banana., MD    ADMISSION DIAGNOSIS:  Acute kidney injury ([place]) [N17.9] Severe sepsis with acute organ dysfunction ([place]) [A41.9, R65.20] Non-traumatic rhabdomyolysis [M62.82] Altered mental status, unspecified altered mental status type [R41.82] Sepsis with acute renal failure without septic shock, due to unspecified organism, unspecified acute renal failure type ([place]) [A41.9, R65.20, N17.9]  DISCHARGE DIAGNOSIS:  Principal Problem:   Sepsis due to Proteus species ([place]) Active Problems:   Anemia, iron deficiency   Weakness   Benign essential tremor   Anxiety, generalized   HLD (hyperlipidemia)   Depression, major, recurrent, in partial remission ([place])   B12 deficiency   Mild cognitive impairment with memory loss   Postural dizziness with presyncope   S/P TKR (total knee replacement) using cement, right   Rhabdomyolysis   Acute kidney injury ([place])   Leukocytosis   Acute pain of right knee   Elevated LFTs   Cyst of left ovary   SECONDARY DIAGNOSIS:   Past Medical History:  Diagnosis Date   Asthma    Bradycardia    Cervical cancer (HCC) 1978   Chronic sinus infection    Ejection fraction    a. EF 55-65%, echo, mild LVH, June, 2013, small pericardial effusion   Fracture, sacrum/coccyx ([place])    History of kidney stones    Hyperlipidemia    a. takes Simvastatin   Hypertension    a. takes Metoprolol, Lisinopril, and amlodipine.   Low back pain    Mild dementia    Nasal polyps    Normal nuclear stress test    a. 07/2005 Nl nuc stress test.   Osteopenia    Osteoporosis    Palpitations    a. June 2013:  Event recorder normal sinus rhythm with rare PVCs - ? symptomatic PVC's.   Parkinson  disease ([place])    Pericardial effusion    a. Small, echo, June, 2013   PONV (postoperative nausea and vomiting)     HOSPITAL COURSE:   Severe sepsis with Proteus growing out of the blood cultures.  Sepsis present on admission.  Patient also had acute kidney injury, hypotension, acute metabolic encephalopathy, tachycardia, tachypnea and leukocytosis.  Patient initially was on aggressive antibiotics and then switched over to Rocephin and then Ancef.  Patient will be on Duricef 500 mg 2 tablets twice a day through 10/28/2021.  Case discussed with Dr. Steva Ready and okay to discharge. Acute kidney injury from rhabdomyolysis and omeprazole hypotension from sepsis.  Creatinine 3.9 and improved to 1.3 upon discharge Weakness and right knee pain from fall.  Physical therapy recommending rehab.  Patient seen by orthopedic surgery and does not feel that this is a septic knee. Rhabdomyolysis.  CPK 15,867 at its peak and came down to 181.  Discontinued atorvastatin.  Would continue to hold statin at this point. Elevated liver function test secondary to sepsis.  LFTs trended better.  Last AST 53 and last ALT 71. Acute metabolic encephalopathy on presentation improved quickly and has resolved Essential hypertension on increased dose of Norvasc.  We will also start low-dose hydrochlorothiazide.  Recommend checking a CMP in 1 week Left adnexa with mildly complex cystic lesion.  Follow-up with gynecology as outpatient.  Follow-up on Roma blood test Parkinson's  disease Asthma.  Continue inhaled  DISCHARGE CONDITIONS:   Satisfactory  CONSULTS OBTAINED:  Treatment Team:  Thornton Park, MD Tsosie Billing, MD Nephrology  DRUG ALLERGIES:   Allergies  Allergen Reactions   Anti-Inflammatory Enzyme [Nutritional Supplements] Other (See Comments)    REACTION: Due to bleeding ulcer   Cymbalta [Duloxetine Hcl] Other (See Comments)    REACTION: Urinary retention   Clarithromycin Other (See Comments)     Upsets stomach   Donepezil Hcl Other (See Comments)    Significant bradycardia causing syncope   Nsaids     GI BLEED   Ranitidine     severe diarrhea   Venlafaxine     severe depression    DISCHARGE MEDICATIONS:   Allergies as of 10/26/2021       Reactions   Anti-inflammatory Enzyme [nutritional Supplements] Other (See Comments)   REACTION: Due to bleeding ulcer   Cymbalta [duloxetine Hcl] Other (See Comments)   REACTION: Urinary retention   Clarithromycin Other (See Comments)   Upsets stomach   Donepezil Hcl Other (See Comments)   Significant bradycardia causing syncope   Nsaids    GI BLEED   Ranitidine    severe diarrhea   Venlafaxine    severe depression        Medication List     STOP taking these medications    rosuvastatin 10 MG tablet Commonly known as: CRESTOR       TAKE these medications    acetaminophen 325 MG tablet Commonly known as: TYLENOL Take 1-2 tablets (325-650 mg total) by mouth every 6 (six) hours as needed for mild pain (pain score 1-3 or temp > 100.5).   albuterol 108 (90 Base) MCG/ACT inhaler Commonly known as: VENTOLIN HFA Inhale 1-2 puffs into the lungs every 6 (six) hours as needed for wheezing or shortness of breath.   amLODipine 10 MG tablet Commonly known as: NORVASC Take 1 tablet (10 mg total) by mouth daily. What changed:  medication strength how much to take   Breztri Aerosphere 160-9-4.8 MCG/ACT Aero Generic drug: Budeson-Glycopyrrol-Formoterol Inhale 160 mcg into the lungs in the morning and at bedtime.   busPIRone 10 MG tablet Commonly known as: BUSPAR Take 1 tablet (10 mg total) by mouth 2 (two) times daily.   Calcium 600+D3 600-20 MG-MCG Tabs Generic drug: Calcium Carb-Cholecalciferol Take 1 tablet by mouth daily.   cefadroxil 500 MG capsule Commonly known as: DURICEF Two tabs po twice a day for five more doses   citalopram 40 MG tablet Commonly known as: CELEXA Take 40 mg by mouth daily.    fexofenadine 180 MG tablet Commonly known as: ALLEGRA Take 180 mg by mouth daily.   hydrochlorothiazide 12.5 MG tablet Commonly known as: HYDRODIURIL Take 1 tablet (12.5 mg total) by mouth daily.   lidocaine 5 % Commonly known as: LIDODERM Place 1 patch onto the skin daily. Remove & Discard patch within 12 hours or as directed by MD   LORazepam 0.5 MG tablet Commonly known as: ATIVAN Take 1 tablet (0.5 mg total) by mouth 3 (three) times daily as needed for anxiety.   oxyCODONE 5 MG immediate release tablet Commonly known as: Oxy IR/ROXICODONE Take 0.5 tablets (2.5 mg total) by mouth every 6 (six) hours as needed for up to 4 days for severe pain.   traZODone 100 MG tablet Commonly known as: DESYREL Take 100 mg by mouth at bedtime.   vitamin B-12 1000 MCG tablet Commonly known as: CYANOCOBALAMIN Take 1,000 mcg by mouth daily.  Vitamin D3 25 MCG tablet Commonly known as: Vitamin D Take 1 tablet (1,000 Units total) by mouth daily.         DISCHARGE INSTRUCTIONS:   Follow-up team at rehab 1 day Follow-up gynecology once out of rehab  If you experience worsening of your admission symptoms, develop shortness of breath, life threatening emergency, suicidal or homicidal thoughts you must seek medical attention immediately by calling 911 or calling your MD immediately  if symptoms less severe.  You Must read complete instructions/literature along with all the possible adverse reactions/side effects for all the Medicines you take and that have been prescribed to you. Take any new Medicines after you have completely understood and accept all the possible adverse reactions/side effects.   Please note  You were cared for by a hospitalist during your hospital stay. If you have any questions about your discharge medications or the care you received while you were in the hospital after you are discharged, you can call the unit and asked to speak with the hospitalist on call if the  hospitalist that took care of you is not available. Once you are discharged, your primary care physician will handle any further medical issues. Please note that NO REFILLS for any discharge medications will be authorized once you are discharged, as it is imperative that you return to your primary care physician (or establish a relationship with a primary care physician if you do not have one) for your aftercare needs so that they can reassess your need for medications and monitor your lab values.    Today   CHIEF COMPLAINT:   Chief Complaint  Patient presents with   Fall   Altered Mental Status    HISTORY OF PRESENT ILLNESS:  Nancy Blackburn  is a 75 y.o. female admitted with fall and altered mental status   VITAL SIGNS:  Blood pressure (!) 147/70, pulse 81, temperature 98.6 F (37 C), resp. rate 19, height 5\' 7"  (1.702 m), weight 86.2 kg, SpO2 92 %.  I/O:   Intake/Output Summary (Last 24 hours) at 10/26/2021 0900 Last data filed at 10/25/2021 2041 Gross per 24 hour  Intake 580 ml  Output --  Net 580 ml    PHYSICAL EXAMINATION:  GENERAL:  75 y.o.-year-old patient lying in the bed with no acute distress.  EYES: Pupils equal, round, reactive to light and accommodation. No scleral icterus.   HEENT: Head atraumatic, normocephalic. Oropharynx and nasopharynx clear.  LUNGS: Normal breath sounds bilaterally, no wheezing, rales,rhonchi or crepitation. No use of accessory muscles of respiration.  CARDIOVASCULAR: S1, S2 normal. No murmurs, rubs, or gallops.  ABDOMEN: Soft, non-tender, non-distended.  EXTREMITIES: No pedal edema, cyanosis, or clubbing.  NEUROLOGIC: Cranial nerves II through XII are intact. Muscle strength 5/5 in all extremities. Sensation intact. Gait not checked.  PSYCHIATRIC: The patient is alert and oriented x 3.  SKIN: No obvious rash, lesion, or ulcer.   DATA REVIEW:   CBC Recent Labs  Lab 10/23/21 0405  WBC 7.8  HGB 10.8*  HCT 32.7*  PLT 180     Chemistries  Recent Labs  Lab 10/23/21 0405 10/24/21 0356 10/26/21 0534  NA 138   < > 143  K 3.7   < > 3.5  CL 108   < > 108  CO2 23   < > 29  GLUCOSE 112*   < > 110*  BUN 46*   < > 24*  CREATININE 2.63*   < > 1.30*  CALCIUM 8.3*   < >  8.2*  AST 53*  --   --   ALT 71*  --   --   ALKPHOS 163*  --   --   BILITOT 0.4  --   --    < > = values in this interval not displayed.     Microbiology Results  Results for orders placed or performed during the hospital encounter of 10/18/21  Culture, blood (routine x 2)     Status: Abnormal   Collection Time: 10/18/21  9:37 AM   Specimen: BLOOD  Result Value Ref Range Status   Specimen Description   Final    BLOOD  RFOA Performed at [facility], [address] [place], [place] [address]    Special Requests   Final    BOTTLES DRAWN AEROBIC AND ANAEROBIC  BCAV Performed at [facility], [address], [place], [place] [address]    Culture  Setup Time   Final    GRAM NEGATIVE RODS IN BOTH AEROBIC AND ANAEROBIC BOTTLES CRITICAL VALUE NOTED.  VALUE IS CONSISTENT WITH PREVIOUSLY REPORTED AND CALLED VALUE. Performed at [facility], [address] [place], [place] [address]    Culture (A)  Final    PROTEUS MIRABILIS SUSCEPTIBILITIES PERFORMED ON PREVIOUS CULTURE WITHIN THE LAST 5 DAYS. Performed at [facility], [place] [address] [place], [place] [address]    Report Status 10/21/2021 FINAL  Final  Culture, blood (routine x 2)     Status: Abnormal   Collection Time: 10/18/21  9:37 AM   Specimen: BLOOD  Result Value Ref Range Status   Specimen Description   Final    BLOOD  LAC Performed at [facility], [address], [place], [place] [address]    Special Requests   Final    BOTTLES DRAWN AEROBIC AND ANAEROBIC Blood Culture adequate volume Performed at [facility], [place] [address], [place], [place] [address]    Culture  Setup Time   Final    Organism ID to follow GRAM  NEGATIVE RODS IN BOTH AEROBIC AND ANAEROBIC BOTTLES CRITICAL RESULT CALLED TO, READ BACK BY AND VERIFIED WITH: Lillia Abed @0031  ON 10/19/21 SKL Performed at [facility], [address] [place], [place] [address]    Culture PROTEUS MIRABILIS (A)  Final   Report Status 10/21/2021 FINAL  Final   Organism ID, Bacteria PROTEUS MIRABILIS  Final      Susceptibility   Proteus mirabilis - MIC*    AMPICILLIN <=2 SENSITIVE Sensitive     CEFAZOLIN <=4 SENSITIVE Sensitive     CEFEPIME <=0.12 SENSITIVE Sensitive     CEFTAZIDIME <=1 SENSITIVE Sensitive     CEFTRIAXONE <=0.25 SENSITIVE Sensitive     CIPROFLOXACIN <=0.25 SENSITIVE Sensitive     GENTAMICIN <=1 SENSITIVE Sensitive     IMIPENEM 2 SENSITIVE Sensitive     TRIMETH/SULFA <=20 SENSITIVE Sensitive     AMPICILLIN/SULBACTAM <=2 SENSITIVE Sensitive     PIP/TAZO <=4 SENSITIVE Sensitive     * PROTEUS MIRABILIS  Blood Culture ID Panel (Reflexed)     Status: Abnormal   Collection Time: 10/18/21  9:37 AM  Result Value Ref Range Status   Enterococcus faecalis NOT DETECTED NOT DETECTED Final   Enterococcus Faecium NOT DETECTED NOT DETECTED Final   Listeria monocytogenes NOT DETECTED NOT DETECTED Final   Staphylococcus species NOT DETECTED NOT DETECTED Final   Staphylococcus aureus (BCID) NOT DETECTED NOT DETECTED Final   Staphylococcus epidermidis NOT DETECTED NOT DETECTED Final  Staphylococcus lugdunensis NOT DETECTED NOT DETECTED Final   Streptococcus species NOT DETECTED NOT DETECTED Final   Streptococcus agalactiae NOT DETECTED NOT DETECTED Final   Streptococcus pneumoniae NOT DETECTED NOT DETECTED Final   Streptococcus pyogenes NOT DETECTED NOT DETECTED Final   A.calcoaceticus-baumannii NOT DETECTED NOT DETECTED Final   Bacteroides fragilis NOT DETECTED NOT DETECTED Final   Enterobacterales DETECTED (A) NOT DETECTED Final    Comment: Enterobacterales represent a large order of gram negative bacteria, not a single  organism. CRITICAL RESULT CALLED TO, READ BACK BY AND VERIFIED WITH: Lillia Abed @0031  ON 10/19/21 SKL    Enterobacter cloacae complex NOT DETECTED NOT DETECTED Final   Escherichia coli NOT DETECTED NOT DETECTED Final   Klebsiella aerogenes NOT DETECTED NOT DETECTED Final   Klebsiella oxytoca NOT DETECTED NOT DETECTED Final   Klebsiella pneumoniae NOT DETECTED NOT DETECTED Final   Proteus species DETECTED (A) NOT DETECTED Final    Comment: CRITICAL RESULT CALLED TO, READ BACK BY AND VERIFIED WITH: Lillia Abed @0031  ON 10/19/21 SKL    Salmonella species NOT DETECTED NOT DETECTED Final   Serratia marcescens NOT DETECTED NOT DETECTED Final   Haemophilus influenzae NOT DETECTED NOT DETECTED Final   Neisseria meningitidis NOT DETECTED NOT DETECTED Final   Pseudomonas aeruginosa NOT DETECTED NOT DETECTED Final   Stenotrophomonas maltophilia NOT DETECTED NOT DETECTED Final   Candida albicans NOT DETECTED NOT DETECTED Final   Candida auris NOT DETECTED NOT DETECTED Final   Candida glabrata NOT DETECTED NOT DETECTED Final   Candida krusei NOT DETECTED NOT DETECTED Final   Candida parapsilosis NOT DETECTED NOT DETECTED Final   Candida tropicalis NOT DETECTED NOT DETECTED Final   Cryptococcus neoformans/gattii NOT DETECTED NOT DETECTED Final   CTX-M ESBL NOT DETECTED NOT DETECTED Final   Carbapenem resistance IMP NOT DETECTED NOT DETECTED Final   Carbapenem resistance KPC NOT DETECTED NOT DETECTED Final   Carbapenem resistance NDM NOT DETECTED NOT DETECTED Final   Carbapenem resist OXA 48 LIKE NOT DETECTED NOT DETECTED Final   Carbapenem resistance VIM NOT DETECTED NOT DETECTED Final    Comment: Performed at [facility], [address] [place], [place] [address]  Resp Panel by RT-PCR (Flu A&B, Covid) Nasopharyngeal Swab     Status: None   Collection Time: 10/18/21 11:15 AM   Specimen: Nasopharyngeal Swab; Nasopharyngeal(NP) swabs in vial transport medium  Result Value Ref Range  Status   SARS Coronavirus 2 by RT PCR NEGATIVE NEGATIVE Final    Comment: (NOTE) SARS-CoV-2 target nucleic acids are NOT DETECTED.  The SARS-CoV-2 RNA is generally detectable in upper respiratory specimens during the acute phase of infection. The lowest concentration of SARS-CoV-2 viral copies this assay can detect is 138 copies/mL. A negative result does not preclude SARS-Cov-2 infection and should not be used as the sole basis for treatment or other patient management decisions. A negative result may occur with  improper specimen collection/handling, submission of specimen other than nasopharyngeal swab, presence of viral mutation(s) within the areas targeted by this assay, and inadequate number of viral copies(<138 copies/mL). A negative result must be combined with clinical observations, patient history, and epidemiological information. The expected result is Negative.  Fact Sheet for Patients:  EntrepreneurPulse.com.au  Fact Sheet for Healthcare Providers:  IncredibleEmployment.be  This test is no t yet approved or cleared by the [place] FDA and  has been authorized for detection and/or diagnosis of SARS-CoV-2 by FDA under an Emergency Use Authorization (EUA). This EUA will remain  in effect (meaning this test can be used) for the duration of the COVID-19 declaration under Section 564(b)(1) of the Act, 21 U.S.C.section 360bbb-3(b)(1), unless the authorization is terminated  or revoked sooner.       Influenza A by PCR NEGATIVE NEGATIVE Final   Influenza B by PCR NEGATIVE NEGATIVE Final    Comment: (NOTE) The Xpert Xpress SARS-CoV-2/FLU/RSV plus assay is intended as an aid in the diagnosis of influenza from Nasopharyngeal swab specimens and should not be used as a sole basis for treatment. Nasal washings and aspirates are unacceptable for Xpert Xpress SARS-CoV-2/FLU/RSV testing.  Fact Sheet for  Patients: EntrepreneurPulse.com.au  Fact Sheet for Healthcare Providers: IncredibleEmployment.be  This test is not yet approved or cleared by the [place] FDA and has been authorized for detection and/or diagnosis of SARS-CoV-2 by FDA under an Emergency Use Authorization (EUA). This EUA will remain in effect (meaning this test can be used) for the duration of the COVID-19 declaration under Section 564(b)(1) of the Act, 21 U.S.C. section 360bbb-3(b)(1), unless the authorization is terminated or revoked.  Performed at [facility], [address] [place], [place] [address]   MRSA Next Gen by PCR, Nasal     Status: None   Collection Time: 10/18/21 12:56 PM   Specimen: Nasal Mucosa; Nasal Swab  Result Value Ref Range Status   MRSA by PCR Next Gen NOT DETECTED NOT DETECTED Final    Comment: (NOTE) The GeneXpert MRSA Assay (FDA approved for NASAL specimens only), is one component of a comprehensive MRSA colonization surveillance program. It is not intended to diagnose MRSA infection nor to guide or monitor treatment for MRSA infections. Test performance is not FDA approved in patients less than 66 years old. Performed at [facility], [address], [place], [place] [address]   Urine Culture     Status: None   Collection Time: 10/19/21 11:13 AM   Specimen: Urine, Random  Result Value Ref Range Status   Specimen Description   Final    URINE, RANDOM Performed at [facility], [address] [place], [place] [address]    Special Requests   Final    Normal Performed at [facility], [address] [place], [place] [address]    Culture   Final    NO GROWTH Performed at [facility], [place] [address] [place], [place] [address]    Report Status 10/20/2021 FINAL  Final  Resp Panel by RT-PCR (Flu A&B, Covid) Nasopharyngeal Swab     Status: None   Collection Time: 10/25/21 10:28 AM   Specimen:  Nasopharyngeal Swab; Nasopharyngeal(NP) swabs in vial transport medium  Result Value Ref Range Status   SARS Coronavirus 2 by RT PCR NEGATIVE NEGATIVE Final    Comment: (NOTE) SARS-CoV-2 target nucleic acids are NOT DETECTED.  The SARS-CoV-2 RNA is generally detectable in upper respiratory specimens during the acute phase of infection. The lowest concentration of SARS-CoV-2 viral copies this assay can detect is 138 copies/mL. A negative result does not preclude SARS-Cov-2 infection and should not be used as the sole basis for treatment or other patient management decisions. A negative result may occur with  improper specimen collection/handling, submission of specimen other than nasopharyngeal swab, presence of viral mutation(s) within the areas targeted by this assay, and inadequate number of viral copies(<138 copies/mL). A negative result must be combined with clinical observations, patient history, and epidemiological information. The expected result is Negative.  Fact Sheet for Patients:  EntrepreneurPulse.com.au  Fact Sheet for Healthcare Providers:  IncredibleEmployment.be  This test is no t yet approved or cleared by the [place] FDA and  has been authorized for detection and/or diagnosis of SARS-CoV-2 by FDA under an Emergency Use Authorization (EUA). This EUA will remain  in effect (meaning this test can be used) for the duration of the COVID-19 declaration under Section 564(b)(1) of the Act, 21 U.S.C.section 360bbb-3(b)(1), unless the authorization is terminated  or revoked sooner.       Influenza A by PCR NEGATIVE NEGATIVE Final   Influenza B by PCR NEGATIVE NEGATIVE Final    Comment: (NOTE) The Xpert Xpress SARS-CoV-2/FLU/RSV plus assay is intended as an aid in the diagnosis of influenza from Nasopharyngeal swab specimens and should not be used as a sole basis for treatment. Nasal washings and aspirates are unacceptable for  Xpert Xpress SARS-CoV-2/FLU/RSV testing.  Fact Sheet for Patients: EntrepreneurPulse.com.au  Fact Sheet for Healthcare Providers: IncredibleEmployment.be  This test is not yet approved or cleared by the [place] FDA and has been authorized for detection and/or diagnosis of SARS-CoV-2 by FDA under an Emergency Use Authorization (EUA). This EUA will remain in effect (meaning this test can be used) for the duration of the COVID-19 declaration under Section 564(b)(1) of the Act, 21 U.S.C. section 360bbb-3(b)(1), unless the authorization is terminated or revoked.  Performed at [facility], [address] [place], [place] [address]       Management plans discussed with the patient, family and they are in agreement.  CODE STATUS:     Code Status Orders  (From admission, onward)           Start     Ordered   10/18/21 1220  Full code  Continuous        10/18/21 1223           Code Status History     Date Active Date Inactive Code Status Order ID Comments User Context   03/09/2021 1232 03/12/2021 2121 Full Code [id number]  Thornton Park, MD Inpatient   07/01/2020 2328 07/03/2020 2053 Full Code [id number]  Mansy, Arvella Merles, MD ED      Advance Directive Documentation    Flowsheet Row Most Recent Value  Type of Advance Directive Healthcare Power of Attorney, Living will  Pre-existing out of facility DNR order (yellow form or pink MOST form) --  "MOST" Form in Place? --       TOTAL TIME TAKING CARE OF THIS PATIENT: 32 minutes.    Loletha Grayer M.D on 10/26/2021 at 9:00 AM    [facility]  CC: Primary care physician; Jerrol Banana., MD

## 2021-10-27 DIAGNOSIS — F419 Anxiety disorder, unspecified: Secondary | ICD-10-CM | POA: Diagnosis not present

## 2021-10-27 DIAGNOSIS — R4182 Altered mental status, unspecified: Secondary | ICD-10-CM | POA: Diagnosis not present

## 2021-10-27 DIAGNOSIS — D649 Anemia, unspecified: Secondary | ICD-10-CM | POA: Diagnosis not present

## 2021-10-27 DIAGNOSIS — R112 Nausea with vomiting, unspecified: Secondary | ICD-10-CM | POA: Diagnosis not present

## 2021-10-27 DIAGNOSIS — E785 Hyperlipidemia, unspecified: Secondary | ICD-10-CM | POA: Diagnosis not present

## 2021-10-27 DIAGNOSIS — M6282 Rhabdomyolysis: Secondary | ICD-10-CM | POA: Diagnosis not present

## 2021-10-27 DIAGNOSIS — D519 Vitamin B12 deficiency anemia, unspecified: Secondary | ICD-10-CM | POA: Diagnosis not present

## 2021-10-27 DIAGNOSIS — F32A Depression, unspecified: Secondary | ICD-10-CM | POA: Diagnosis not present

## 2021-10-27 DIAGNOSIS — R6520 Severe sepsis without septic shock: Secondary | ICD-10-CM | POA: Diagnosis not present

## 2021-10-27 DIAGNOSIS — I10 Essential (primary) hypertension: Secondary | ICD-10-CM | POA: Diagnosis not present

## 2021-10-27 DIAGNOSIS — S37009A Unspecified injury of unspecified kidney, initial encounter: Secondary | ICD-10-CM | POA: Diagnosis not present

## 2021-10-27 DIAGNOSIS — D72829 Elevated white blood cell count, unspecified: Secondary | ICD-10-CM | POA: Diagnosis not present

## 2021-10-28 ENCOUNTER — Ambulatory Visit: Payer: Self-pay | Admitting: Family Medicine

## 2021-10-28 DIAGNOSIS — M6282 Rhabdomyolysis: Secondary | ICD-10-CM | POA: Diagnosis not present

## 2021-10-28 DIAGNOSIS — D519 Vitamin B12 deficiency anemia, unspecified: Secondary | ICD-10-CM | POA: Diagnosis not present

## 2021-10-28 DIAGNOSIS — I10 Essential (primary) hypertension: Secondary | ICD-10-CM | POA: Diagnosis not present

## 2021-10-28 DIAGNOSIS — R4182 Altered mental status, unspecified: Secondary | ICD-10-CM | POA: Diagnosis not present

## 2021-10-28 DIAGNOSIS — R6520 Severe sepsis without septic shock: Secondary | ICD-10-CM | POA: Diagnosis not present

## 2021-10-28 DIAGNOSIS — D649 Anemia, unspecified: Secondary | ICD-10-CM | POA: Diagnosis not present

## 2021-10-28 DIAGNOSIS — F03A0 Unspecified dementia, mild, without behavioral disturbance, psychotic disturbance, mood disturbance, and anxiety: Secondary | ICD-10-CM | POA: Diagnosis not present

## 2021-10-28 DIAGNOSIS — F419 Anxiety disorder, unspecified: Secondary | ICD-10-CM | POA: Diagnosis not present

## 2021-10-28 DIAGNOSIS — F32A Depression, unspecified: Secondary | ICD-10-CM | POA: Diagnosis not present

## 2021-10-28 DIAGNOSIS — S37009A Unspecified injury of unspecified kidney, initial encounter: Secondary | ICD-10-CM | POA: Diagnosis not present

## 2021-10-31 LAB — CULTURE, BLOOD (ROUTINE X 2)
Culture: NO GROWTH
Culture: NO GROWTH
Special Requests: ADEQUATE
Special Requests: ADEQUATE

## 2021-11-01 ENCOUNTER — Telehealth: Payer: Self-pay

## 2021-11-01 DIAGNOSIS — R4182 Altered mental status, unspecified: Secondary | ICD-10-CM | POA: Diagnosis not present

## 2021-11-01 DIAGNOSIS — M6282 Rhabdomyolysis: Secondary | ICD-10-CM | POA: Diagnosis not present

## 2021-11-01 DIAGNOSIS — F419 Anxiety disorder, unspecified: Secondary | ICD-10-CM | POA: Diagnosis not present

## 2021-11-01 DIAGNOSIS — F32A Depression, unspecified: Secondary | ICD-10-CM | POA: Diagnosis not present

## 2021-11-01 DIAGNOSIS — R112 Nausea with vomiting, unspecified: Secondary | ICD-10-CM | POA: Diagnosis not present

## 2021-11-01 DIAGNOSIS — D72829 Elevated white blood cell count, unspecified: Secondary | ICD-10-CM | POA: Diagnosis not present

## 2021-11-01 DIAGNOSIS — E785 Hyperlipidemia, unspecified: Secondary | ICD-10-CM | POA: Diagnosis not present

## 2021-11-01 DIAGNOSIS — D649 Anemia, unspecified: Secondary | ICD-10-CM | POA: Diagnosis not present

## 2021-11-01 DIAGNOSIS — I10 Essential (primary) hypertension: Secondary | ICD-10-CM | POA: Diagnosis not present

## 2021-11-01 DIAGNOSIS — D519 Vitamin B12 deficiency anemia, unspecified: Secondary | ICD-10-CM | POA: Diagnosis not present

## 2021-11-01 DIAGNOSIS — R6520 Severe sepsis without septic shock: Secondary | ICD-10-CM | POA: Diagnosis not present

## 2021-11-01 DIAGNOSIS — S37009A Unspecified injury of unspecified kidney, initial encounter: Secondary | ICD-10-CM | POA: Diagnosis not present

## 2021-11-01 LAB — OVARIAN MALIGNANCY RISK-ROMA
Cancer Antigen (CA) 125: 13.2 U/mL (ref 0.0–38.1)
HE4: 868 pmol/L — ABNORMAL HIGH (ref 0.0–96.9)
Postmenopausal ROMA: 6.98 — ABNORMAL HIGH
Premenopausal ROMA: 9.86 — ABNORMAL HIGH

## 2021-11-01 LAB — PREMENOPAUSAL INTERP: HIGH

## 2021-11-01 LAB — POSTMENOPAUSAL INTERP: HIGH

## 2021-11-01 NOTE — Telephone Encounter (Signed)
Contacted patient' daughter ( on [place] ). Patient to scheduled ER follow up with [facility] for postmenopausal bleeding. Called and left voicemail for patient's daughter  to call back to be scheduled.

## 2021-11-01 NOTE — Progress Notes (Signed)
Patient ID: Nancy Blackburn, female   DOB: 1946/12/11, 75 y.o.   MRN: [id number]  Postmenopausal Roma came back elevated at 6.98.  H E4 came back elevated at 868.0.  CA125 normal range at 13.2.  Tried to reach the patient on her cell phone and left a message.  I also messaged gynecology Dr. Gilman Schmidt about these results and she will have her office set up a follow-up appointment.  Dr. Loletha Grayer

## 2021-11-02 NOTE — Telephone Encounter (Signed)
Patient is scheduled for 11/22/21 with [facility]S

## 2021-11-09 ENCOUNTER — Other Ambulatory Visit: Payer: Self-pay | Admitting: *Deleted

## 2021-11-09 DIAGNOSIS — I10 Essential (primary) hypertension: Secondary | ICD-10-CM

## 2021-11-09 NOTE — Patient Outreach (Signed)
Verified in [place] that Nancy Blackburn transitioned from [facility] on 11/02/21. Screened for potential [facility] care coordination needs.   Telephone call made to Nancy Blackburn [phone]. Patient identifiers confirmed. Nancy Blackburn confirms she was in SNF for a week and returned home on 11/02/21. States she was told by SNF to follow up with outpatient therapy when she returned home. However, Nancy Blackburn states she thinks she is too weak for outpatient therapy. States she prefers home health for now. Nancy Blackburn to discuss this with PCP tomorrow during her appointment.  Nancy Blackburn confirms that she lives with her grandson but he works during the day. States friends and church members check on her during the day and bring her food. Endorses transportation to MD appointment tomorrow will be provided by her friend. Denies having any transportation needs.   Nancy Blackburn confirms that she has equipment in the home such as walker, cane, bedside commode.   Nancy Blackburn states she is not sure who she is supposed to follow up with for GYN and nephrology. States she plans on discussing this with her PCP. States she did not receive this information from SNF.   Nancy Blackburn also states she wants to be sure her blood pressures are okay since she had some medication changes. She states she does have a bp cuff but has not taken her blood pressure as of yet. Encouraged her to do so and track her bp readings in order to discuss with PCP.   Discussed Probation officer making referral to [facility] embedded care coordination team with PCP office. Nancy Blackburn is agreeable. Confirmed contact numbers on file are correct.  Will make referral to [facility] embedded team for RN care coordination.  Marthenia Rolling, MSN, RN,BSN [place] Acute Care Coordinator [phone] [facility]) [phone]  (Toll free office)

## 2021-11-10 ENCOUNTER — Ambulatory Visit (INDEPENDENT_AMBULATORY_CARE_PROVIDER_SITE_OTHER): Payer: Medicare Other | Admitting: Family Medicine

## 2021-11-10 ENCOUNTER — Telehealth: Payer: Self-pay | Admitting: *Deleted

## 2021-11-10 ENCOUNTER — Other Ambulatory Visit: Payer: Self-pay

## 2021-11-10 ENCOUNTER — Encounter: Payer: Self-pay | Admitting: Family Medicine

## 2021-11-10 VITALS — BP 123/79 | HR 89 | Temp 99.4°F | Wt 174.4 lb

## 2021-11-10 DIAGNOSIS — I10 Essential (primary) hypertension: Secondary | ICD-10-CM | POA: Diagnosis not present

## 2021-11-10 DIAGNOSIS — E538 Deficiency of other specified B group vitamins: Secondary | ICD-10-CM | POA: Diagnosis not present

## 2021-11-10 DIAGNOSIS — N83209 Unspecified ovarian cyst, unspecified side: Secondary | ICD-10-CM

## 2021-11-10 DIAGNOSIS — J301 Allergic rhinitis due to pollen: Secondary | ICD-10-CM | POA: Diagnosis not present

## 2021-11-10 DIAGNOSIS — G3184 Mild cognitive impairment, so stated: Secondary | ICD-10-CM

## 2021-11-10 DIAGNOSIS — J4521 Mild intermittent asthma with (acute) exacerbation: Secondary | ICD-10-CM

## 2021-11-10 DIAGNOSIS — N83202 Unspecified ovarian cyst, left side: Secondary | ICD-10-CM | POA: Diagnosis not present

## 2021-11-10 DIAGNOSIS — G20 Parkinson's disease: Secondary | ICD-10-CM | POA: Diagnosis not present

## 2021-11-10 DIAGNOSIS — N179 Acute kidney failure, unspecified: Secondary | ICD-10-CM | POA: Diagnosis not present

## 2021-11-10 DIAGNOSIS — A419 Sepsis, unspecified organism: Secondary | ICD-10-CM | POA: Diagnosis not present

## 2021-11-10 DIAGNOSIS — F411 Generalized anxiety disorder: Secondary | ICD-10-CM | POA: Diagnosis not present

## 2021-11-10 MED ORDER — HYDROCHLOROTHIAZIDE 12.5 MG PO TABS: 12.5000 mg | ORAL_TABLET | ORAL | 1 refills | Status: DC

## 2021-11-10 NOTE — Chronic Care Management (AMB) (Signed)
  Chronic Care Management   Note  11/10/2021 Name: Nancy Blackburn MRN: [id number] DOB: 1946-03-02  Nancy Blackburn is a 75 y.o. year old female who is a primary care patient of Jerrol Banana., MD. I reached out to [name] by phone today in response to a referral sent by [name]'s PCP.  [name] was given information about Chronic Care Management services today including:  CCM service includes personalized support from designated clinical staff supervised by her physician, including individualized plan of care and coordination with other care providers 24/7 contact phone numbers for assistance for urgent and routine care needs. Service will only be billed when office clinical staff spend 20 minutes or more in a month to coordinate care. Only one practitioner may furnish and bill the service in a calendar month. The patient may stop CCM services at any time (effective at the end of the month) by phone call to the office staff. The patient is responsible for co-pay (up to 20% after annual deductible is met) if co-pay is required by the individual health plan.   Patient agreed to services and verbal consent obtained.   Follow up plan: Telephone appointment with care management team member scheduled for: 11/19/2021  Julian Hy, [place] Management  Direct Dial: [phone]

## 2021-11-10 NOTE — Progress Notes (Signed)
Established Patient Office Visit  Subjective:  Patient ID: Nancy Blackburn, female    DOB: 1946/10/24  Age: 75 y.o. MRN: [id number]    HPI Follow up Hospitalization  Patient was admitted to on [facility] on 10/18/21  and discharged on 10/26/2021. She was treated for Acute kidney injury Severe sepsis with acute organ dysfunction  Severe sepsis with acute organ dysfunction Non-traumatic rhabdomyolysis  Altered mental status, unspecified altered mental status type  Sepsis with acute renal failure without septic shock, due to unspecified organism, unspecified acute renal failure type. Treatment for this included Follow-up team at rehab 1 day Follow-up gynecology once out of rehab   Patient is now out of rehab and living at home with her grandsons looking after her.  She walks with a walker.  She has had no recent falls.  She has a son in [place] and her daughter is in [place] but unfortunately has been in the ICU in [place] for 3 weeks following a bleeding ulcer following total hip replacement.  Patient is obviously distressed about this but coping well. She is feeling better from her admission and getting her strength back.  She has no specific complaints. She has appointment in December with pulmonary, neurology, gynecology. She reports fair compliance with treatment. She reports this condition is improved. She is now on Breztri and HCTZ as new medications. ----------------------------------------------------------------------------------------- -    Past Medical History:  Diagnosis Date   Asthma    Bradycardia    Cervical cancer ([place]) 1978   Chronic sinus infection    Ejection fraction    a. EF 55-65%, echo, mild LVH, June, 2013, small pericardial effusion   Fracture, sacrum/coccyx ([place])    History of kidney stones    Hyperlipidemia    a. takes Simvastatin   Hypertension    a. takes Metoprolol, Lisinopril, and amlodipine.   Low back pain    Mild  dementia    Nasal polyps    Normal nuclear stress test    a. 07/2005 Nl nuc stress test.   Osteopenia    Osteoporosis    Palpitations    a. June 2013:  Event recorder normal sinus rhythm with rare PVCs - ? symptomatic PVC's.   Parkinson disease ([place])    Pericardial effusion    a. Small, echo, June, 2013   PONV (postoperative nausea and vomiting)     Past Surgical History:  Procedure Laterality Date   BACK SURGERY  08/2010   BLADDER SUSPENSION  2003   bone spur  2009   removed from right collar bone area and partial collar bone removed   CATARACT EXTRACTION W/ INTRAOCULAR LENS  IMPLANT, BILATERAL     EYE SURGERY     LUMBAR FUSION  2011, 2012   SHOULDER ARTHROSCOPY Right 2012   THYROIDECTOMY, PARTIAL  1976   TONSILLECTOMY     as child   TOTAL KNEE ARTHROPLASTY Right 03/09/2021   Procedure: Right TOTAL KNEE ARTHROPLASTY;  Surgeon: Thornton Park, MD;  Location: [facility] ORS;  Service: Orthopedics;  Laterality: Right;   TUBAL LIGATION  1977   VAGINAL HYSTERECTOMY  1978   d/t cervical cancer    Family History  Problem Relation Age of Onset   Colon cancer Mother    Heart murmur Mother    Heart disease Mother    Angina Mother    Heart disease Father    COPD Father    Atrial fibrillation Brother    Cushing syndrome Daughter  Cancer Daughter        thyroid   Breast cancer Cousin    Hypotension Neg Hx    Malignant hyperthermia Neg Hx    Pseudochol deficiency Neg Hx     Social History   Socioeconomic History   Marital status: Divorced    Spouse name: Not on file   Number of children: 2   Years of education: Not on file   Highest education level: 12th grade  Occupational History   Occupation: retired  Tobacco Use   Smoking status: Never   Smokeless tobacco: Never  Vaping Use   Vaping Use: Never used  Substance and Sexual Activity   Alcohol use: Yes    Alcohol/week: 0.0 standard drinks    Comment: rarely   Drug use: No   Sexual activity: Never  Other Topics  Concern   Not on file  Social History Narrative   Not on file   Social Determinants of Health   Financial Resource Strain: Not on file  Food Insecurity: No Food Insecurity   Worried About Running Out of Food in the Last Year: Never true   Ran Out of Food in the Last Year: Never true  Transportation Needs: No Transportation Needs   Lack of Transportation (Medical): No   Lack of Transportation (Non-Medical): No  Physical Activity: Not on file  Stress: Not on file  Social Connections: Not on file  Intimate Partner Violence: Not on file    Outpatient Medications Prior to Visit  Medication Sig Dispense Refill   acetaminophen (TYLENOL) 325 MG tablet Take 1-2 tablets (325-650 mg total) by mouth every 6 (six) hours as needed for mild pain (pain score 1-3 or temp > 100.5). 60 tablet 1   albuterol (VENTOLIN HFA) 108 (90 Base) MCG/ACT inhaler Inhale 1-2 puffs into the lungs every 6 (six) hours as needed for wheezing or shortness of breath. 8 g 2   amLODipine (NORVASC) 10 MG tablet Take 1 tablet (10 mg total) by mouth daily. 30 tablet 0   Budeson-Glycopyrrol-Formoterol (BREZTRI AEROSPHERE) 160-9-4.8 MCG/ACT AERO Inhale 160 mcg into the lungs in the morning and at bedtime. 5.9 g 0   busPIRone (BUSPAR) 10 MG tablet Take 1 tablet (10 mg total) by mouth 2 (two) times daily. 60 tablet 0   Calcium Carb-Cholecalciferol (CALCIUM 600+D3) 600-800 MG-UNIT TABS Take 1 tablet by mouth daily.     cholecalciferol (VITAMIN D) 25 MCG tablet Take 1 tablet (1,000 Units total) by mouth daily. 30 tablet 0   citalopram (CELEXA) 40 MG tablet Take 40 mg by mouth daily.     fexofenadine (ALLEGRA) 180 MG tablet Take 180 mg by mouth daily.     LORazepam (ATIVAN) 0.5 MG tablet Take 1 tablet (0.5 mg total) by mouth 3 (three) times daily as needed for anxiety. 30 tablet 0   traZODone (DESYREL) 100 MG tablet Take 100 mg by mouth at bedtime.     vitamin B-12 (CYANOCOBALAMIN) 1000 MCG tablet Take 1,000 mcg by mouth daily.      lidocaine (LIDODERM) 5 % Place 1 patch onto the skin daily. Remove & Discard patch within 12 hours or as directed by MD (Patient not taking: Reported on 11/10/2021) 30 patch 0   hydrochlorothiazide (HYDRODIURIL) 12.5 MG tablet Take 1 tablet (12.5 mg total) by mouth daily. (Patient not taking: Reported on 11/10/2021) 30 tablet 0   No facility-administered medications prior to visit.    Allergies  Allergen Reactions   Anti-Inflammatory Enzyme [Nutritional Supplements] Other (See Comments)  REACTION: Due to bleeding ulcer   Cymbalta [Duloxetine Hcl] Other (See Comments)    REACTION: Urinary retention   Clarithromycin Other (See Comments)    Upsets stomach   Donepezil Hcl Other (See Comments)    Significant bradycardia causing syncope   Nsaids     GI BLEED   Ranitidine     severe diarrhea   Venlafaxine     severe depression    ROS Review of Systems    Objective:    Physical Exam Vitals reviewed.  Constitutional:      Appearance: Normal appearance. She is well-developed.  HENT:     Head: Normocephalic and atraumatic.     Right Ear: External ear normal.     Left Ear: External ear normal.     Nose: Nose normal.  Eyes:     General: No scleral icterus.    Conjunctiva/sclera: Conjunctivae normal.  Neck:     Thyroid: No thyromegaly.  Cardiovascular:     Rate and Rhythm: Normal rate and regular rhythm.     Heart sounds: Normal heart sounds.  Pulmonary:     Effort: Pulmonary effort is normal.     Breath sounds: Normal breath sounds.  Chest:  Breasts:    Breasts are symmetrical.     Right: Normal.     Left: Normal.  Abdominal:     Palpations: Abdomen is soft.  Skin:    General: Skin is warm and dry.  Neurological:     Mental Status: She is alert and oriented to person, place, and time. Mental status is at baseline.  Psychiatric:        Mood and Affect: Mood normal.        Behavior: Behavior normal.        Thought Content: Thought content normal.        Judgment:  Judgment normal.    BP 123/79 (BP Location: Right Arm, Patient Position: Sitting, Cuff Size: Normal)   Pulse 89   Temp 99.4 F (37.4 C) (Temporal)   Wt 174 lb 6.4 oz (79.1 kg)   SpO2 95%   BMI 27.31 kg/m  Wt Readings from Last 3 Encounters:  11/10/21 174 lb 6.4 oz (79.1 kg)  10/19/21 190 lb 0.6 oz (86.2 kg)  10/08/21 177 lb (80.3 kg)     Health Maintenance Due  Topic Date Due   Zoster Vaccines- Shingrix (1 of 2) Never done   COVID-19 Vaccine (4 - Booster for Pfizer series) 01/06/2021    There are no preventive care reminders to display for this patient.  Lab Results  Component Value Date   TSH 1.620 04/14/2021   Lab Results  Component Value Date   WBC 7.8 10/23/2021   HGB 10.8 (L) 10/23/2021   HCT 32.7 (L) 10/23/2021   MCV 90.3 10/23/2021   PLT 180 10/23/2021   Lab Results  Component Value Date   NA 143 10/26/2021   K 3.5 10/26/2021   CO2 29 10/26/2021   GLUCOSE 110 (H) 10/26/2021   BUN 24 (H) 10/26/2021   CREATININE 1.30 (H) 10/26/2021   BILITOT 0.4 10/23/2021   ALKPHOS 163 (H) 10/23/2021   AST 53 (H) 10/23/2021   ALT 71 (H) 10/23/2021   PROT 4.7 (L) 10/23/2021   ALBUMIN 1.9 (L) 10/23/2021   CALCIUM 8.2 (L) 10/26/2021   ANIONGAP 6 10/26/2021   EGFR 72 07/29/2021   Lab Results  Component Value Date   CHOL 200 (H) 04/12/2019   Lab Results  Component Value Date  HDL 59 04/12/2019   Lab Results  Component Value Date   LDLCALC 110 (H) 04/12/2019   Lab Results  Component Value Date   TRIG 154 (H) 04/12/2019   Lab Results  Component Value Date   CHOLHDL 3.4 04/12/2019   Lab Results  Component Value Date   HGBA1C 5.4 02/26/2021      Assessment & Plan:   Problem List Items Addressed This Visit   None Visit Diagnoses     Essential hypertension    -  Primary   Relevant Medications   hydrochlorothiazide (HYDRODIURIL) 12.5 MG tablet   Other Relevant Orders   CBC w/Diff/Platelet   Comprehensive Metabolic Panel (CMET)   Sed Rate (ESR)    Sepsis, due to unspecified organism, unspecified whether acute organ dysfunction present (HCC)       Relevant Orders   CBC w/Diff/Platelet   Comprehensive Metabolic Panel (CMET)   Sed Rate (ESR)   Cyst of ovary, unspecified laterality       Relevant Orders   CBC w/Diff/Platelet   Comprehensive Metabolic Panel (CMET)   Sed Rate (ESR)   AKI (acute kidney injury) ([place])       Relevant Orders   CBC w/Diff/Platelet   Comprehensive Metabolic Panel (CMET)   Sed Rate (ESR)       Meds ordered this encounter  Medications   hydrochlorothiazide (HYDRODIURIL) 12.5 MG tablet    Sig: Take 1 tablet (12.5 mg total) by mouth every morning.    Dispense:  90 tablet    Refill:  1  1. Sepsis, due to unspecified organism, unspecified whether acute organ dysfunction present [facility]) Patient is recovered from sepsis.  Sed rate was 84 during admission I will recheck.  Should  be down. - CBC w/Diff/Platelet - Comprehensive Metabolic Panel (CMET) - Sed Rate (ESR) - AMB Referral to [place]  2. Essential hypertension Add HCTZ 12.5 to her regimen - CBC w/Diff/Platelet - Comprehensive Metabolic Panel (CMET) - Sed Rate (ESR) - AMB Referral to [place]  3. Cyst of ovary, unspecified laterality Has GYN appointment with Dr. Nechama Guard in 2 weeks.  Possible neoplasm in the cyst. - CBC w/Diff/Platelet - Comprehensive Metabolic Panel (CMET) - Sed Rate (ESR) - AMB Referral to [place]  4. AKI (acute kidney injury) ([place]) Stay hydrated.  Follow renal function.  Determine whether to follow-up with nephrology depending on renal function.  Patient is feeling overwhelmed with seeing specialist - CBC w/Diff/Platelet - Comprehensive Metabolic Panel (CMET) - Sed Rate (ESR) - AMB Referral to [place]  5. Parkinson's disease [facility]) Doing well and seen neurology next month - AMB Referral to [place]  6. Mild cognitive  impairment with memory loss  - AMB Referral to [place]  7. Primary hypertension   8. Cyst of left ovary Sees gynecology in 2-week  9. Mild intermittent asthma with acute exacerbation Per pulmonary in 1 week.  She is improving on breast 3  10. Allergic rhinitis due to pollen, unspecified seasonality   11. Anxiety, generalized Stable on citalopram 40  12. B12 deficiency    Follow-up: Return in about 3 months (around 02/08/2022).   I, Wilhemena Durie, MD, have reviewed all documentation for this visit. The documentation on 11/10/21 for the exam, diagnosis, procedures, and orders are all accurate and complete.  [name], [place]

## 2021-11-11 ENCOUNTER — Telehealth: Payer: Self-pay | Admitting: *Deleted

## 2021-11-11 LAB — COMPREHENSIVE METABOLIC PANEL
ALT: 6 IU/L (ref 0–32)
AST: 13 IU/L (ref 0–40)
Albumin/Globulin Ratio: 1.3 (ref 1.2–2.2)
Albumin: 3.9 g/dL (ref 3.7–4.7)
Alkaline Phosphatase: 139 IU/L — ABNORMAL HIGH (ref 44–121)
BUN/Creatinine Ratio: 9 — ABNORMAL LOW (ref 12–28)
BUN: 10 mg/dL (ref 8–27)
Bilirubin Total: 0.3 mg/dL (ref 0.0–1.2)
CO2: 26 mmol/L (ref 20–29)
Calcium: 9.3 mg/dL (ref 8.7–10.3)
Chloride: 102 mmol/L (ref 96–106)
Creatinine, Ser: 1.07 mg/dL — ABNORMAL HIGH (ref 0.57–1.00)
Globulin, Total: 2.9 g/dL (ref 1.5–4.5)
Glucose: 102 mg/dL — ABNORMAL HIGH (ref 70–99)
Potassium: 3.7 mmol/L (ref 3.5–5.2)
Sodium: 143 mmol/L (ref 134–144)
Total Protein: 6.8 g/dL (ref 6.0–8.5)
eGFR: 54 mL/min/{1.73_m2} — ABNORMAL LOW (ref >59)

## 2021-11-11 LAB — CBC WITH DIFFERENTIAL/PLATELET
Basophils Absolute: 0.1 10*3/uL (ref 0.0–0.2)
Basos: 1 %
EOS (ABSOLUTE): 0.2 10*3/uL (ref 0.0–0.4)
Eos: 2 %
Hematocrit: 37.7 % (ref 34.0–46.6)
Hemoglobin: 12.4 g/dL (ref 11.1–15.9)
Immature Grans (Abs): 0 10*3/uL (ref 0.0–0.1)
Immature Granulocytes: 0 %
Lymphocytes Absolute: 1.9 10*3/uL (ref 0.7–3.1)
Lymphs: 25 %
MCH: 29 pg (ref 26.6–33.0)
MCHC: 32.9 g/dL (ref 31.5–35.7)
MCV: 88 fL (ref 79–97)
Monocytes Absolute: 1 10*3/uL — ABNORMAL HIGH (ref 0.1–0.9)
Monocytes: 13 %
Neutrophils Absolute: 4.4 10*3/uL (ref 1.4–7.0)
Neutrophils: 59 %
Platelets: 430 10*3/uL (ref 150–450)
RBC: 4.27 x10E6/uL (ref 3.77–5.28)
RDW: 12.3 % (ref 11.7–15.4)
WBC: 7.5 10*3/uL (ref 3.4–10.8)

## 2021-11-11 LAB — SEDIMENTATION RATE: Sed Rate: 53 mm/hr — ABNORMAL HIGH (ref 0–40)

## 2021-11-11 NOTE — Chronic Care Management (AMB) (Signed)
  Chronic Care Management   Note  11/11/2021 Name: Nancy Blackburn MRN: [id number] DOB: [birth year]  Nancy Blackburn is a 75 y.o. year old female who is a primary care patient of Jerrol Banana., MD. Nancy Blackburn is currently enrolled in care management services. An additional referral for Pharm D was placed.   Follow up plan: Unsuccessful telephone outreach attempt made. A HIPAA compliant phone message was left for the patient providing contact information and requesting a return call.   Julian Hy, [place] Management  Direct Dial: [phone]

## 2021-11-15 ENCOUNTER — Telehealth: Payer: Self-pay

## 2021-11-15 DIAGNOSIS — N179 Acute kidney failure, unspecified: Secondary | ICD-10-CM

## 2021-11-15 NOTE — Telephone Encounter (Signed)
Patient aware. Lab ordered and place up front.

## 2021-11-15 NOTE — Telephone Encounter (Signed)
-----  Message from Mikey Kirschner, PA-C sent at 11/15/2021 11:33 AM EST ----- Per dr gilbert's last note ESR is down, good. Creat is improved from hospitalization but not at her normal, ~0.9. Pt wants to avoid specialists. Recommend repeat CMP in 4 weeks. Encourage fluids and avoid NSAIDs

## 2021-11-16 ENCOUNTER — Other Ambulatory Visit: Payer: Self-pay

## 2021-11-16 ENCOUNTER — Encounter: Payer: Self-pay | Admitting: Pulmonary Disease

## 2021-11-16 ENCOUNTER — Ambulatory Visit (INDEPENDENT_AMBULATORY_CARE_PROVIDER_SITE_OTHER): Payer: Medicare Other | Admitting: Pulmonary Disease

## 2021-11-16 VITALS — BP 120/60 | HR 86 | Temp 98.6°F | Ht 67.0 in | Wt 176.2 lb

## 2021-11-16 DIAGNOSIS — I272 Pulmonary hypertension, unspecified: Secondary | ICD-10-CM | POA: Diagnosis not present

## 2021-11-16 DIAGNOSIS — R0602 Shortness of breath: Secondary | ICD-10-CM

## 2021-11-16 DIAGNOSIS — A4159 Other Gram-negative sepsis: Secondary | ICD-10-CM

## 2021-11-16 DIAGNOSIS — J4530 Mild persistent asthma, uncomplicated: Secondary | ICD-10-CM | POA: Diagnosis not present

## 2021-11-16 NOTE — Patient Instructions (Addendum)
Continue Breztri 2 puffs twice a day  We will see you in follow-up in 4 to 6 weeks time you may see me or the nurse practitioner at that time just want to make sure that you are doing well.  Call sooner should any new difficulties arise.

## 2021-11-16 NOTE — Progress Notes (Signed)
Subjective:    Patient ID: Nancy Blackburn, female    DOB: March 12, 1946, 75 y.o.   MRN: [id number] Chief Complaint  Patient presents with   Follow-up    Recent admission 10/18/2021--sob with exertion.    Requesting MD/Service: Nancy Blackburn Date of initial consultation: 06/10/16 by Dr. Merton Blackburn Reason for consultation: Dyspnea   PT PROFILE: 75 y.o. F, lifelong never smoker, referred for evaluation of unexplained exertional dyspnea.    DATA: 04/14/16 Echocardiogram : LVEF 22-02%, grade 2 diastolic dysfunction, LA mildly dilated 04/15/15 Myoview : no evidence of ischemia 07/14/16 PFTs : Normal spirometry, mild restriction, normal DLCO 07/14/16 6 MWT : 336 meters. Limited by arthritic pain in knees > dyspnea 07/03/2020 echocardiogram: EF 60 to 65%, grade 1 DD, there was evidence of increased right ventricular pressure 09/15/2020 long-term monitor: Frequent PVCs/ventricular ectopy, symptomatic 08/26/2021 PFTs: Essentially normal 09/28/2021 echocardiogram: LVEF 60 to 65% LVH grade 1 DD normal RV function, mildly elevated RV systolic pressure, mild MR, TR, aortic sclerosis.     Interim:  Patient has increasing shortness of breath, previously followed by Dr. Merton Blackburn, first visit with me on 24 August 2021 by me on 30 September 2021.  This is a follow-up visit, in the interim has had episode of sepsis due to Proteus mirabilis and admitted to [facility] from 19 October 2019 due to 26 October 2021.  She had acute renal failure and rhabdomyolysis during that admission.  Discharged to short-term rehab after that.  HPI Nancy Blackburn is a 75 year old lifelong never smoker who presents for follow-up on the issue of dyspnea on exertion.  She has mild persistent asthma with an element of asthmatic bronchitis.  She had been given a sample by her primary care physician previously of [place] and this helped her.  She has been maintained on this as her insurance company covers it.  She continues to  note benefit from the medication.  Since her discharge from the hospital however she has noted fatigue however she is woefully deconditioned.  She had significant rhabdomyolysis and acute kidney injury and is still recovering from this.  She did get discharged to short-term rehab.  She has now been discharged from there.  From the respiratory standpoint she does not note any new issues.  No cough, sputum production, hemoptysis or any other issues.  No chest pain.  No calf tenderness or lower extremity edema.  She remains as noted, very debilitated after her recent admission and now requires a walker to assist her during ambulation.  With regards to [facility] she has been on this medication because of the inability to get Symbicort.  She is actually doing well on this is not experiencing any urinary retention issues.   Review of Systems A 10 point review of systems was performed and it is as noted above otherwise negative.  Patient Active Problem List   Diagnosis Date Noted   Cyst of left ovary    Acute pain of right knee    Elevated LFTs    Acute metabolic encephalopathy    Sepsis due to Proteus species ([place]) 10/18/2021   Rhabdomyolysis 10/18/2021   Acute kidney injury ([place]) 10/18/2021   Leukocytosis 10/18/2021   Asthma 07/02/2021   S/P TKR (total knee replacement) using cement, right 03/09/2021   Bradycardia 07/03/2020   Postural dizziness with presyncope 07/01/2020   Family history of colon cancer in mother 02/22/2018   Cervical spondylosis 05/04/2017   Cervical radiculopathy 05/04/2017   Family history of [place] disease 01/12/2017  B12 deficiency 12/18/2016   Mild cognitive impairment with memory loss 12/18/2016   Parkinsonian features 12/18/2016   Vitamin D deficiency 12/18/2016   Chalazion, bilateral 06/08/2016   Absolute anemia 07/30/2015   Edema extremities 07/30/2015   Benign essential tremor 07/30/2015   Anxiety, generalized 07/30/2015   Gastro-esophageal reflux disease  without esophagitis 07/30/2015   HLD (hyperlipidemia) 07/30/2015   Cannot sleep 07/30/2015   Lumbar radiculopathy 07/30/2015   Depression, major, recurrent, in partial remission ([place]) 07/30/2015   Arthritis, degenerative 07/30/2015   Allergic rhinitis 07/30/2015   Gastroduodenal ulcer 07/30/2015   Calcium blood increased 07/30/2015   Pre-syncope 05/17/2015   Dyspnea 03/31/2015   Bleeding stomach ulcer 10/14/2013   Anemia, iron deficiency 10/14/2013   Weakness 10/14/2013   Ejection fraction    Pericardial effusion    Palpitations    Low back pain    Normal nuclear stress test    Social History   Tobacco Use   Smoking status: Never   Smokeless tobacco: Never  Substance Use Topics   Alcohol use: Yes    Alcohol/week: 0.0 standard drinks    Comment: rarely   Allergies  Allergen Reactions   Anti-Inflammatory Enzyme [Nutritional Supplements] Other (See Comments)    REACTION: Due to bleeding ulcer   Cymbalta [Duloxetine Hcl] Other (See Comments)    REACTION: Urinary retention   Clarithromycin Other (See Comments)    Upsets stomach   Donepezil Hcl Other (See Comments)    Significant bradycardia causing syncope   Nsaids     GI BLEED   Ranitidine     severe diarrhea   Venlafaxine     severe depression   Current Meds  Medication Sig   acetaminophen (TYLENOL) 325 MG tablet Take 1-2 tablets (325-650 mg total) by mouth every 6 (six) hours as needed for mild pain (pain score 1-3 or temp > 100.5).   albuterol (VENTOLIN HFA) 108 (90 Base) MCG/ACT inhaler Inhale 1-2 puffs into the lungs every 6 (six) hours as needed for wheezing or shortness of breath.   amLODipine (NORVASC) 10 MG tablet Take 1 tablet (10 mg total) by mouth daily.   Budeson-Glycopyrrol-Formoterol (BREZTRI AEROSPHERE) 160-9-4.8 MCG/ACT AERO Inhale 160 mcg into the lungs in the morning and at bedtime.   busPIRone (BUSPAR) 10 MG tablet Take 1 tablet (10 mg total) by mouth 2 (two) times daily.   Calcium  Carb-Cholecalciferol (CALCIUM 600+D3) 600-800 MG-UNIT TABS Take 1 tablet by mouth daily.   cholecalciferol (VITAMIN D) 25 MCG tablet Take 1 tablet (1,000 Units total) by mouth daily.   citalopram (CELEXA) 40 MG tablet Take 40 mg by mouth daily.   fexofenadine (ALLEGRA) 180 MG tablet Take 180 mg by mouth daily.   hydrochlorothiazide (HYDRODIURIL) 12.5 MG tablet Take 1 tablet (12.5 mg total) by mouth every morning.   lidocaine (LIDODERM) 5 % Place 1 patch onto the skin daily. Remove & Discard patch within 12 hours or as directed by MD   LORazepam (ATIVAN) 0.5 MG tablet Take 1 tablet (0.5 mg total) by mouth 3 (three) times daily as needed for anxiety.   traZODone (DESYREL) 100 MG tablet Take 100 mg by mouth at bedtime.   vitamin B-12 (CYANOCOBALAMIN) 1000 MCG tablet Take 1,000 mcg by mouth daily.   Immunization History  Administered Date(s) Administered   Fluad Quad(high Dose 65+) 10/21/2019, 10/14/2020, 08/25/2021   Influenza Inj Mdck Quad Pf 10/27/2016   Influenza Split 10/05/2011, 10/10/2012   Influenza, High Dose Seasonal PF 09/04/2014, 09/01/2015, 10/05/2017, 10/10/2018  Influenza-Unspecified 10/12/2013, 08/12/2014, 09/04/2014   PFIZER(Purple Top)SARS-COV-2 Vaccination 02/06/2020, 03/03/2020, 11/11/2020   Pneumococcal Conjugate-13 03/26/2015   Pneumococcal Polysaccharide-23 10/11/1999, 04/10/2012   Td 08/23/1999   Tdap 05/20/2019   Tetanus 05/28/2019   Zoster, Live 09/26/2008       Objective:   Physical Exam BP 120/60 (BP Location: Left Arm, Cuff Size: Normal)   Pulse 86   Temp 98.6 F (37 C) (Oral)   Ht 5\' 7"  (1.702 m)   Wt 176 lb 3.2 oz (79.9 kg)   SpO2 96%   BMI 27.60 kg/m  GENERAL: Well-developed, well-nourished woman, chronically ill-appearing, no acute distress, no overt respiratory distress.  No conversational dyspnea per se.  Ambulating with assistance of walker. HEAD: Normocephalic, atraumatic.  EYES: Pupils equal, round, reactive to light.  No scleral icterus.   Mild periorbital edema MOUTH: Nose/mouth/throat not examined due to masking requirements for COVID 19. NECK: Supple. No thyromegaly. Trachea midline. No JVD.  No adenopathy. PULMONARY: Good air entry bilaterally.  No adventitious sounds. CARDIOVASCULAR: S1 and S2. Regular rate and rhythm.  Grade 2/6 holosystolic murmur, left sternal Blackburn. ABDOMEN: Mild truncal obesity otherwise benign. MUSCULOSKELETAL: No joint deformity, no clubbing, no edema.  NEUROLOGIC: Mild resting tremor, no overt focal deficit, walks with assistance of a walker SKIN: Intact,warm,dry. PSYCH: Normal mood, normal behavior.     Assessment & Plan:     ICD-10-CM   1. Mild persistent asthma without complication  M01.02    Has an element of asthmatic bronchitis as well Continue Breztri 2 puffs twice a day Patient notes improvement on the medication No urinary retention noted    2. Pulmonary hypertension (HCC)  I27.20    Follow-up with 2D echo May benefit from right heart cath    3. Shortness of breath  R06.02    Currently mostly fatigue Deconditioning a big issue right now Needs more home directed physical therapy Defer to primary care    4. Sepsis due to Proteus species ([place])  A41.59    Recent admission due to UTI and sepsis Continue to monitor     Pulmonary standpoint she is doing well.  We will see her in follow-up in 4 to 6 weeks time with either me or the nurse practitioner at that time.  She is to contact us prior to that time should any respiratory issues arise in the interim.  Renold Don, MD Advanced Bronchoscopy PCCM [facility]    *This note was dictated using voice recognition software/Dragon.  Despite best efforts to proofread, errors can occur which can change the meaning.  Any change was purely unintentional.

## 2021-11-19 ENCOUNTER — Telehealth: Payer: Medicare Other

## 2021-11-19 ENCOUNTER — Ambulatory Visit: Payer: Self-pay

## 2021-11-19 DIAGNOSIS — J4521 Mild intermittent asthma with (acute) exacerbation: Secondary | ICD-10-CM

## 2021-11-19 NOTE — Chronic Care Management (AMB) (Signed)
  Chronic Care Management   CCM RN Visit Note  11/19/2021 Name: BENEDETTA SUNDSTROM MRN: [id number] DOB: 03-21-1946  Subjective: Nancy Blackburn is a 75 y.o. year old female who is a primary care patient of Jerrol Banana., MD. The care management team was consulted for assistance with disease management and care coordination needs.    Mrs. Lobato anticipates being available for telephonic outreach on 11/25/21. RN Care Manager contact information provided. Agreed to call with questions or concerns if needed prior to the outreach.    PLAN Will follow up on 11/25/21.   [name] Care Management [facility] [phone]

## 2021-11-22 ENCOUNTER — Encounter: Payer: Self-pay | Admitting: Obstetrics and Gynecology

## 2021-11-22 ENCOUNTER — Other Ambulatory Visit: Payer: Self-pay

## 2021-11-22 ENCOUNTER — Ambulatory Visit (INDEPENDENT_AMBULATORY_CARE_PROVIDER_SITE_OTHER): Payer: Medicare Other | Admitting: Obstetrics and Gynecology

## 2021-11-22 VITALS — BP 124/71 | Ht 67.0 in | Wt 174.6 lb

## 2021-11-22 DIAGNOSIS — N83202 Unspecified ovarian cyst, left side: Secondary | ICD-10-CM | POA: Diagnosis not present

## 2021-11-22 DIAGNOSIS — F331 Major depressive disorder, recurrent, moderate: Secondary | ICD-10-CM | POA: Diagnosis not present

## 2021-11-22 DIAGNOSIS — F411 Generalized anxiety disorder: Secondary | ICD-10-CM | POA: Diagnosis not present

## 2021-11-22 DIAGNOSIS — F5105 Insomnia due to other mental disorder: Secondary | ICD-10-CM | POA: Diagnosis not present

## 2021-11-22 NOTE — Patient Instructions (Signed)
Bilateral Salpingo-Oophorectomy Bilateral salpingo-oophorectomy is the surgical removal of both fallopian tubes and both ovaries. The ovaries are reproductive organs that produce eggs in females. The fallopian tubes allow eggs to move from the ovaries to the uterus. You may need this procedure if you: Have had your uterus removed. This procedure is usually done after the uterus is removed. Have cancer of the fallopian tubes or ovaries. Have a high risk of cancer of the fallopian tubes or ovaries. There are three techniques that can be used for this procedure: Open. One large incision will be made in your abdomen. Laparoscopic. A thin, lighted tube with a small camera (laparoscope) is used to help perform the procedure. The laparoscope allows your surgeon to make several small incisions in the abdomen instead of one large incision. Robot-assisted. A computer is used to control surgical instruments that are attached to robotic arms. A laparoscope may also be used with this technique. After this procedure, you will not be able to become pregnant (will be sterile), and you will go into menopause. Menopause is when you no longer have menstrual periods. Symptoms of menopause can include hot flashes, night sweats, and mood changes. Your sex drive may also be affected. Tell a health care provider about: Any allergies you have. All medicines you are taking, including vitamins, herbs, eye drops, creams, and over-the-counter medicines. Any problems you or family members have had with anesthetic medicines. Any blood disorders you have. Any surgeries you have had. Any medical conditions you have. Whether you are pregnant or may be pregnant. What are the risks? Generally, this is a safe procedure. However, problems may occur, including: Infection. Bleeding. Allergic reactions to medicines. Damage to nearby structures or organs. Blood clots in the legs or lungs. What happens before the procedure? Staying  hydrated Follow instructions from your health care provider about hydration, which may include: Up to 2 hours before the procedure - you may continue to drink clear liquids, such as water, clear fruit juice, black coffee, and plain tea.  Eating and drinking restrictions Follow instructions from your health care provider about eating and drinking, which may include: 8 hours before the procedure - stop eating heavy meals or foods, such as meat, fried foods, or fatty foods. 6 hours before the procedure - stop eating light meals or foods, such as toast or cereal. 6 hours before the procedure - stop drinking milk or drinks that contain milk. 2 hours before the procedure - stop drinking clear liquids. Medicines Ask your health care provider about: Changing or stopping your regular medicines. This is especially important if you are taking diabetes medicines or blood thinners. Taking medicines such as aspirin and ibuprofen. These medicines can thin your blood. Do not take these medicines unless your health care provider tells you to take them. Taking over-the-counter medicines, vitamins, herbs, and supplements. General instructions Do not use any products that contain nicotine or tobacco for at least 4 weeks before the procedure. These products include cigarettes, chewing tobacco, and vaping devices, such as e-cigarettes. If you need help quitting, ask your health care provider. You may have an exam or tests, such as a blood or urine test. Ask your health care provider: How your surgery site will be marked. What steps will be taken to help prevent infection. These steps may include: Removing hair at the surgery site. Washing skin with a germ-killing soap. Taking antibiotic medicine. Plan to have a responsible adult take you home from the hospital or clinic. If you will  be going home right after the procedure, plan to have a responsible adult care for you for the time you are told. This is  important. What happens during the procedure? An IV will be inserted into one of your veins. You will be given one or both of the following: A medicine to help you relax (sedative). A medicine to make you fall asleep (general anesthetic). A small, thin tube (catheter) will be inserted through your urethra and into your bladder. The catheter drains urine during your procedure. Depending on the type of surgery you are having, one incision or several small incisions will be made in your abdomen. Your fallopian tubes and ovaries will be cut away from the uterus and removed. Your blood vessels will be clamped and tied to prevent too much bleeding. The incision or incisions in your abdomen will be closed with stitches (sutures), staples, or skin glue. A bandage (dressing) may be placed over your incision or incisions. The procedure may vary among health care providers and hospitals. What happens after the procedure? Your blood pressure, heart rate, breathing rate, and blood oxygen level will be monitored until you leave the hospital or clinic. You may continue to receive fluids and medicines through an IV. You may continue to have a catheter draining your urine. You may have to wear compression stockings. These stockings help to prevent blood clots and reduce swelling in your legs. You will be given pain medicine as needed. If you were given a sedative during the procedure, it can affect you for several hours. Do not drive or operate machinery until your health care provider says that it is safe. Summary Bilateral salpingo-oophorectomy is a procedure to remove both fallopian tubes and both ovaries. There are three different techniques that can be used for this procedure: open, laparoscopic, and robotic. Talk with your health care provider about how your procedure will be done. After this procedure, you will not be able to become pregnant (will be sterile). You will go into menopause. This is when  you no longer have a menstrual period. Plan to have a responsible adult take you home from the hospital or clinic. This information is not intended to replace advice given to you by your health care provider. Make sure you discuss any questions you have with your health care provider. Document Revised: 10/20/2020 Document Reviewed: 10/20/2020 Elsevier Patient Education  2022 [employer].

## 2021-11-22 NOTE — Progress Notes (Signed)
Patient ID: Nancy Blackburn, female   DOB: 08/28/1946, 75 y.o.   MRN: [id number]  Reason for Consult: Follow-up   Referred by Jerrol Banana.,*  Subjective:     HPI:  Nancy Blackburn is a 75 y.o. female she is here to follow-up after a recent hospital admission showed a left ovarian cyst.  Patient reports that she was hospitalized recently for urosepsis and kidney failure, she was added outpatient facility recovering for about a week after her admission.  She has not had symptoms of a pelvic mass.  She denies any changes in appetite.  She denies any unexplained weight loss.  She denies early satiety.  She denies changes in bowel movements.  She reports that she had a history of cervical cancer in her 59s.  She had a cervical cone that did not remove all of the cancer and for that reason a hysterectomy was recommended.  She reports that she retained her ovaries.  She denies having any chemo or radiation at the time of her cervical cancer diagnosis.     She reports that her last pelvic exam was 10 years ago or so.  She had had a ultrasound which had shown a ovarian cyst but follow-up was not performed because the ovarian was not felt at the time of exam.  Gynecological History  No LMP recorded. Patient has had a hysterectomy. Menarche: 12  History of fibroids, polyps, or ovarian cysts? : remote history of ovarian cyst  History of PCOS? no Hstory of Endometriosis? no History of abnormal pap smears? yes Have you had any sexually transmitted infections in the past? no  Last Pap: unknown  She identifies as a female. She is not sexually active.   Last Mammogram 02/2021: BIRAD 1 Last colonoscopy 2019: Diverticulosis- 5 yr follow up recommended because of family history  Obstetrical History OB History  Gravida Para Term Preterm AB Living  3 2 2   1 2   SAB IAB Ectopic Multiple Live Births          2    # Outcome Date GA Lbr Len/2nd Weight Sex Delivery Anes PTL Lv   3 Term 06/12/73    M Vag-Spont   LIV  2 Term 08/26/66    F Vag-Spont   LIV  1 AB 1966             Past Medical History:  Diagnosis Date   Asthma    Bradycardia    Cervical cancer ([place]) 1978   Chronic sinus infection    Ejection fraction    a. EF 55-65%, echo, mild LVH, June, 2013, small pericardial effusion   Fracture, sacrum/coccyx ([place])    History of kidney stones    Hyperlipidemia    a. takes Simvastatin   Hypertension    a. takes Metoprolol, Lisinopril, and amlodipine.   Low back pain    Mild dementia    Nasal polyps    Normal nuclear stress test    a. 07/2005 Nl nuc stress test.   Osteopenia    Osteoporosis    Palpitations    a. June 2013:  Event recorder normal sinus rhythm with rare PVCs - ? symptomatic PVC's.   Parkinson disease ([place])    Pericardial effusion    a. Small, echo, June, 2013   PONV (postoperative nausea and vomiting)    Family History  Problem Relation Age of Onset   Colon cancer Mother    Heart murmur Mother    Heart  disease Mother    Angina Mother    Heart disease Father    COPD Father    Atrial fibrillation Brother    Cushing syndrome Daughter    Cancer Daughter        thyroid   Breast cancer Cousin    Hypotension Neg Hx    Malignant hyperthermia Neg Hx    Pseudochol deficiency Neg Hx    Past Surgical History:  Procedure Laterality Date   BACK SURGERY  08/2010   BLADDER SUSPENSION  2003   bone spur  2009   removed from right collar bone area and partial collar bone removed   CATARACT EXTRACTION W/ INTRAOCULAR LENS  IMPLANT, BILATERAL     EYE SURGERY     LUMBAR FUSION  2011, 2012   SHOULDER ARTHROSCOPY Right 2012   THYROIDECTOMY, PARTIAL  1976   TONSILLECTOMY     as child   TOTAL KNEE ARTHROPLASTY Right 03/09/2021   Procedure: Right TOTAL KNEE ARTHROPLASTY;  Surgeon: Thornton Park, MD;  Location: [facility] ORS;  Service: Orthopedics;  Laterality: Right;   TUBAL LIGATION  1977   VAGINAL HYSTERECTOMY  1978   d/t cervical cancer     Short Social History:  Social History   Tobacco Use   Smoking status: Never   Smokeless tobacco: Never  Substance Use Topics   Alcohol use: Yes    Alcohol/week: 0.0 standard drinks    Comment: rarely    Allergies  Allergen Reactions   Anti-Inflammatory Enzyme [Nutritional Supplements] Other (See Comments)    REACTION: Due to bleeding ulcer   Cymbalta [Duloxetine Hcl] Other (See Comments)    REACTION: Urinary retention   Clarithromycin Other (See Comments)    Upsets stomach   Donepezil Hcl Other (See Comments)    Significant bradycardia causing syncope   Nsaids     GI BLEED   Ranitidine     severe diarrhea   Venlafaxine     severe depression    Current Outpatient Medications  Medication Sig Dispense Refill   acetaminophen (TYLENOL) 325 MG tablet Take 1-2 tablets (325-650 mg total) by mouth every 6 (six) hours as needed for mild pain (pain score 1-3 or temp > 100.5). 60 tablet 1   albuterol (VENTOLIN HFA) 108 (90 Base) MCG/ACT inhaler Inhale 1-2 puffs into the lungs every 6 (six) hours as needed for wheezing or shortness of breath. 8 g 2   amLODipine (NORVASC) 10 MG tablet Take 1 tablet (10 mg total) by mouth daily. 30 tablet 0   Budeson-Glycopyrrol-Formoterol (BREZTRI AEROSPHERE) 160-9-4.8 MCG/ACT AERO Inhale 160 mcg into the lungs in the morning and at bedtime. 5.9 g 0   busPIRone (BUSPAR) 10 MG tablet Take 1 tablet (10 mg total) by mouth 2 (two) times daily. 60 tablet 0   Calcium Carb-Cholecalciferol (CALCIUM 600+D3) 600-800 MG-UNIT TABS Take 1 tablet by mouth daily.     cholecalciferol (VITAMIN D) 25 MCG tablet Take 1 tablet (1,000 Units total) by mouth daily. 30 tablet 0   citalopram (CELEXA) 40 MG tablet Take 40 mg by mouth daily.     fexofenadine (ALLEGRA) 180 MG tablet Take 180 mg by mouth daily.     hydrochlorothiazide (HYDRODIURIL) 12.5 MG tablet Take 1 tablet (12.5 mg total) by mouth every morning. 90 tablet 1   lidocaine (LIDODERM) 5 % Place 1 patch onto the  skin daily. Remove & Discard patch within 12 hours or as directed by MD 30 patch 0   LORazepam (ATIVAN) 0.5 MG tablet Take  1 tablet (0.5 mg total) by mouth 3 (three) times daily as needed for anxiety. 30 tablet 0   oxyCODONE (OXY IR/ROXICODONE) 5 MG immediate release tablet Take 2.5 mg by mouth every 6 (six) hours as needed.     predniSONE (DELTASONE) 10 MG tablet Take by mouth.     traZODone (DESYREL) 100 MG tablet Take 100 mg by mouth at bedtime.     vitamin B-12 (CYANOCOBALAMIN) 1000 MCG tablet Take 1,000 mcg by mouth daily.     No current facility-administered medications for this visit.    Review of Systems  Constitutional: Positive for fatigue. Negative for chills, fever and unexpected weight change.  HENT: Negative for trouble swallowing.  Eyes: Negative for loss of vision.  Respiratory: Positive for shortness of breath. Negative for cough and wheezing.  Cardiovascular: Negative for chest pain, leg swelling, palpitations and syncope.  GI: Negative for abdominal pain, blood in stool, diarrhea, nausea and vomiting.  GU: Positive for frequency. Negative for difficulty urinating, dysuria and hematuria.  Musculoskeletal: Negative for back pain, leg pain and joint pain.  Skin: Negative for rash.  Neurological: Negative for dizziness, headaches, light-headedness, numbness and seizures.  Psychiatric: Positive for depressed mood. Negative for behavioral problem, confusion and sleep disturbance.       Objective:  Objective   Vitals:   11/22/21 1440  BP: 124/71  Weight: 174 lb 9.6 oz (79.2 kg)  Height: 5\' 7"  (1.702 m)   Body mass index is 27.35 kg/m.  Physical Exam Vitals and nursing note reviewed. Exam conducted with a chaperone present.  Constitutional:      Appearance: Normal appearance. She is well-developed.  HENT:     Head: Normocephalic and atraumatic.  Eyes:     Extraocular Movements: Extraocular movements intact.     Pupils: Pupils are equal, round, and reactive to  light.  Cardiovascular:     Rate and Rhythm: Normal rate and regular rhythm.  Pulmonary:     Effort: Pulmonary effort is normal. No respiratory distress.     Breath sounds: Normal breath sounds.  Abdominal:     General: Abdomen is flat.     Palpations: Abdomen is soft.  Genitourinary:    Comments: External: Normal appearing vulva. No lesions noted.  Speculum examination: Normal vaginal cuff Bimanual examination: Uterus absent. No adnexal masses appreciated. No adnexal tenderness. Pelvis not fixed.  Breast exam: exam not performed Musculoskeletal:        General: No signs of injury.  Skin:    General: Skin is warm and dry.  Neurological:     Mental Status: She is alert and oriented to person, place, and time.  Psychiatric:        Behavior: Behavior normal.        Thought Content: Thought content normal.        Judgment: Judgment normal.    Assessment/Plan:     75 yo with left ovarian cyst and elevated ROMA. Reports a remote history of cervical cancer. Discussed risk of malignancy. Will refer to gynecological oncology for consultation and surgical planning.   More than 20 minutes were spent face to face with the patient in the room, reviewing the medical record, labs and images, and coordinating care for the patient. The plan of management was discussed in detail and counseling was provided.   Adrian Prows MD [facility], [facility] 11/22/2021 2:44 PM

## 2021-11-25 ENCOUNTER — Ambulatory Visit (INDEPENDENT_AMBULATORY_CARE_PROVIDER_SITE_OTHER): Payer: Medicare Other

## 2021-11-25 DIAGNOSIS — I10 Essential (primary) hypertension: Secondary | ICD-10-CM

## 2021-11-25 DIAGNOSIS — J4521 Mild intermittent asthma with (acute) exacerbation: Secondary | ICD-10-CM

## 2021-11-25 DIAGNOSIS — Z9181 History of falling: Secondary | ICD-10-CM

## 2021-11-25 DIAGNOSIS — E782 Mixed hyperlipidemia: Secondary | ICD-10-CM

## 2021-11-25 NOTE — Chronic Care Management (AMB) (Signed)
Chronic Care Management   CCM RN Visit Note  11/25/2021 Name: Nancy Blackburn MRN: [id number] DOB: 1946/10/11  Subjective: JAKYRA KENEALY is a 75 y.o. year old female who is a primary care patient of Jerrol Banana., MD. The care management team was consulted for assistance with disease management and care coordination needs.    Engaged with patient by telephone for initial visit in response to provider referral for case management and care coordination services.   Consent to Services:  The patient was given the following information about Chronic Care Management services 1. CCM service includes personalized support from designated clinical staff supervised by the primary care provider, including individualized plan of care and coordination with other care providers 2. 24/7 contact phone numbers for assistance for urgent and routine care needs. 3. Service will only be billed when office clinical staff spend 20 minutes or more in a month to coordinate care. 4. Only one practitioner may furnish and bill the service in a calendar month. 5.The patient may stop CCM services at any time (effective at the end of the month) by phone call to the office staff. 6. The patient will be responsible for cost sharing (co-pay) of up to 20% of the service fee (after annual deductible is met). Patient agreed to services and consent obtained.   Assessment: Review of patient past medical history, allergies, medications, health status, including review of consultants reports, laboratory and other test data, was performed as part of comprehensive evaluation and provision of chronic care management services.   SDOH (Social Determinants of Health) assessments and interventions performed:  SDOH Interventions    Flowsheet Row Most Recent Value  SDOH Interventions   Food Insecurity Interventions Intervention Not Indicated  Transportation Interventions Intervention Not Indicated        CCM Care  Plan  Allergies  Allergen Reactions   Anti-Inflammatory Enzyme [Nutritional Supplements] Other (See Comments)    REACTION: Due to bleeding ulcer   Cymbalta [Duloxetine Hcl] Other (See Comments)    REACTION: Urinary retention   Clarithromycin Other (See Comments)    Upsets stomach   Donepezil Hcl Other (See Comments)    Significant bradycardia causing syncope   Nsaids     GI BLEED   Ranitidine     severe diarrhea   Venlafaxine     severe depression    Outpatient Encounter Medications as of 11/25/2021  Medication Sig Note   acetaminophen (TYLENOL) 325 MG tablet Take 1-2 tablets (325-650 mg total) by mouth every 6 (six) hours as needed for mild pain (pain score 1-3 or temp > 100.5).    albuterol (VENTOLIN HFA) 108 (90 Base) MCG/ACT inhaler Inhale 1-2 puffs into the lungs every 6 (six) hours as needed for wheezing or shortness of breath.    amLODipine (NORVASC) 10 MG tablet Take 1 tablet (10 mg total) by mouth daily.    Budeson-Glycopyrrol-Formoterol (BREZTRI AEROSPHERE) 160-9-4.8 MCG/ACT AERO Inhale 160 mcg into the lungs in the morning and at bedtime.    busPIRone (BUSPAR) 10 MG tablet Take 1 tablet (10 mg total) by mouth 2 (two) times daily.    Calcium Carb-Cholecalciferol (CALCIUM 600+D3) 600-800 MG-UNIT TABS Take 1 tablet by mouth daily.    cholecalciferol (VITAMIN D) 25 MCG tablet Take 1 tablet (1,000 Units total) by mouth daily.    citalopram (CELEXA) 40 MG tablet Take 40 mg by mouth daily.    fexofenadine (ALLEGRA) 180 MG tablet Take 180 mg by mouth daily.    hydrochlorothiazide (HYDRODIURIL)  12.5 MG tablet Take 1 tablet (12.5 mg total) by mouth every morning.    LORazepam (ATIVAN) 0.5 MG tablet Take 1 tablet (0.5 mg total) by mouth 3 (three) times daily as needed for anxiety.    vitamin B-12 (CYANOCOBALAMIN) 1000 MCG tablet Take 1,000 mcg by mouth daily.    lidocaine (LIDODERM) 5 % Place 1 patch onto the skin daily. Remove & Discard patch within 12 hours or as directed by MD  (Patient not taking: Reported on 11/25/2021)    oxyCODONE (OXY IR/ROXICODONE) 5 MG immediate release tablet Take 2.5 mg by mouth every 6 (six) hours as needed. (Patient not taking: Reported on 11/25/2021)    predniSONE (DELTASONE) 10 MG tablet Take by mouth. (Patient not taking: Reported on 11/25/2021)    traZODone (DESYREL) 100 MG tablet Take 100 mg by mouth at bedtime. 11/25/2021: Reports being instructed to take 2 tablets at night.   No facility-administered encounter medications on file as of 11/25/2021.    Patient Active Problem List   Diagnosis Date Noted   Cyst of left ovary    Acute pain of right knee    Elevated LFTs    Acute metabolic encephalopathy    Sepsis due to Proteus species ([place]) 10/18/2021   Rhabdomyolysis 10/18/2021   Acute kidney injury ([place]) 10/18/2021   Leukocytosis 10/18/2021   Asthma 07/02/2021   S/P TKR (total knee replacement) using cement, right 03/09/2021   Bradycardia 07/03/2020   Postural dizziness with presyncope 07/01/2020   Family history of colon cancer in mother 02/22/2018   Cervical spondylosis 05/04/2017   Cervical radiculopathy 05/04/2017   Family history of Cushing disease 01/12/2017   B12 deficiency 12/18/2016   Mild cognitive impairment with memory loss 12/18/2016   Parkinsonian features 12/18/2016   Vitamin D deficiency 12/18/2016   Chalazion, bilateral 06/08/2016   Absolute anemia 07/30/2015   Edema extremities 07/30/2015   Benign essential tremor 07/30/2015   Anxiety, generalized 07/30/2015   Gastro-esophageal reflux disease without esophagitis 07/30/2015   HLD (hyperlipidemia) 07/30/2015   Cannot sleep 07/30/2015   Lumbar radiculopathy 07/30/2015   Depression, major, recurrent, in partial remission ([place]) 07/30/2015   Arthritis, degenerative 07/30/2015   Allergic rhinitis 07/30/2015   Gastroduodenal ulcer 07/30/2015   Calcium blood increased 07/30/2015   Pre-syncope 05/17/2015   Dyspnea 03/31/2015   Bleeding stomach ulcer  10/14/2013   Anemia, iron deficiency 10/14/2013   Weakness 10/14/2013   Ejection fraction    Pericardial effusion    Palpitations    Low back pain    Normal nuclear stress test    Patient Care Plan: RN Care Management Plan of Care     Problem Identified: Asthma, HTN, HLD and Fall Risk      Long-Range Goal: Disease Progression Prevented or Minimized   Start Date: 11/25/2021  Expected End Date: 02/23/2022  Priority: High  Note:   Current Barriers:  Chronic Disease Management support and education needs related to HTN, HLD, Asthma, and Fall Risk  RNCM Clinical Goal(s):  Patient will demonstrate adherence to prescribed treatment plan for HTN, HLD, Asthma, and Fall Risk through collaboration with the provider, RN Care manager and the care management team.  Interventions: 1:1 collaboration with primary care provider regarding development and update of comprehensive plan of care as evidenced by provider attestation and co-signature Inter-disciplinary care team collaboration (see longitudinal plan of care) Evaluation of current treatment plan related to  self management and patient's adherence to plan as established by provider   Asthma: (Status:New goal.) Long  Term Goal Discussed medications and plan regarding Asthma management and exacerbation prevention. Advised to monitor symptoms and manage Asthma triggers Discussed effectiveness of current inhalers. Advised to continue using as prescribed and notify the care management team with concerns regarding prescription cost. Advised to notify a provider if experiencing moderate symptoms for 48 hours without improvement Advised to utilize infection prevention strategies to reduce risk of respiratory infection Discussed the importance of adequate rest and management of fatigue with Asthma   Hyperlipidemia Interventions:  (Status:  New goal.) Long Term Goal Lab Results  Component Value Date   CHOL 200 (H) 04/12/2019   HDL 59 04/12/2019    LDLCALC 110 (H) 04/12/2019   TRIG 154 (H) 04/12/2019   CHOLHDL 3.4 04/12/2019    Medications reviewed Reviewed established cholesterol goals  Discussed importance of regular laboratory monitoring as prescribed Reviewed importance of limiting foods high in cholesterol  Hypertension Interventions:  (Status:  New goal.) Long Term Goal Last practice recorded BP readings:  BP Readings from Last 3 Encounters:  11/22/21 124/71  11/16/21 120/60  11/10/21 123/79  Most recent eGFR/CrCl:  Lab Results  Component Value Date   EGFR 54 (L) 11/10/2021    No components found for: CRCL  Reviewed medications. Encouraged to continue taking as prescribed and notify provider if unable to tolerate prescribed regimen.  Provided information regarding established blood pressure parameters along with indications for notifying a provider. Encouraged to continue monitoring and recording reading. Discussed compliance with recommended cardiac prudent diet. Encouraged to continue monitoring sodium intake. Encouraged to continue reading nutrition labels and avoid highly processed foods when possible. Reviewed s/sx of heart attack, stroke and worsening symptoms that require immediate medical attention.   Patient Goals/Self-Care Activities: Take all medications as prescribed Attend all scheduled provider appointments Call pharmacy for medication refills 3-7 days in advance of running out of medications Perform all self care activities independently  Call provider office for new concerns or questions    Follow Up Plan:   Will follow up next month    PLAN A member of the care management team will follow up with Mrs. Argo next month.   [name] [facility] Care Management [facility] [phone]

## 2021-11-25 NOTE — Patient Instructions (Addendum)
Visit Information   Thank you for allowing the Chronic Care Management team to participate in your care. It was great speaking with you today! Please don't hesitate to contact me if I can be of assistance to you before our next scheduled telephone appointment.  Our next appointment is  on January 07, 2022 at 1 pm.     Following is a copy of your full care plan:  Patient Care Plan: RN Care Management Plan of Care     Problem Identified: Asthma, HTN, HLD and Fall Risk      Long-Range Goal: Disease Progression Prevented or Minimized   Start Date: 11/25/2021  Expected End Date: 02/23/2022  Priority: High  Note:   Current Barriers:  Chronic Disease Management support and education needs related to HTN, HLD, Asthma, and Fall Risk  RNCM Clinical Goal(s):  Patient will demonstrate adherence to prescribed treatment plan for HTN, HLD, Asthma, and Fall Risk through collaboration with the provider, RN Care manager and the care management team.  Interventions: 1:1 collaboration with primary care provider regarding development and update of comprehensive plan of care as evidenced by provider attestation and co-signature Inter-disciplinary care team collaboration (see longitudinal plan of care) Evaluation of current treatment plan related to  self management and patient's adherence to plan as established by provider   Asthma: (Status:New goal.) Long Term Goal Discussed medications and plan regarding Asthma management and exacerbation prevention. Advised to monitor symptoms and manage Asthma triggers Discussed effectiveness of current inhalers. Advised to continue using as prescribed and notify the care management team with concerns regarding prescription cost. Advised to notify a provider if experiencing moderate symptoms for 48 hours without improvement Advised to utilize infection prevention strategies to reduce risk of respiratory infection Discussed the importance of adequate rest and  management of fatigue with Asthma   Hyperlipidemia Interventions:  (Status:  New goal.) Long Term Goal Lab Results  Component Value Date   CHOL 200 (H) 04/12/2019   HDL 59 04/12/2019   LDLCALC 110 (H) 04/12/2019   TRIG 154 (H) 04/12/2019   CHOLHDL 3.4 04/12/2019    Medications reviewed Reviewed established cholesterol goals  Discussed importance of regular laboratory monitoring as prescribed Reviewed importance of limiting foods high in cholesterol  Hypertension Interventions:  (Status:  New goal.) Long Term Goal Last practice recorded BP readings:  BP Readings from Last 3 Encounters:  11/22/21 124/71  11/16/21 120/60  11/10/21 123/79  Most recent eGFR/CrCl:  Lab Results  Component Value Date   EGFR 54 (L) 11/10/2021    No components found for: CRCL  Reviewed medications. Encouraged to continue taking as prescribed and notify provider if unable to tolerate prescribed regimen.  Provided information regarding established blood pressure parameters along with indications for notifying a provider. Encouraged to continue monitoring and recording reading. Discussed compliance with recommended cardiac prudent diet. Encouraged to continue monitoring sodium intake. Encouraged to continue reading nutrition labels and avoid highly processed foods when possible. Reviewed s/sx of heart attack, stroke and worsening symptoms that require immediate medical attention.   Patient Goals/Self-Care Activities: Take all medications as prescribed Attend all scheduled provider appointments Call pharmacy for medication refills 3-7 days in advance of running out of medications Perform all self care activities independently  Call provider office for new concerns or questions    Follow Up Plan:   Will follow up next month    Consent to CCM Services: Nancy Blackburn was given information about Chronic Care Management services including:  CCM service includes  personalized support from designated  clinical staff supervised by her physician, including individualized plan of care and coordination with other care providers 24/7 contact phone numbers for assistance for urgent and routine care needs. Service will only be billed when office clinical staff spend 20 minutes or more in a month to coordinate care. Only one practitioner may furnish and bill the service in a calendar month. The patient may stop CCM services at any time (effective at the end of the month) by phone call to the office staff. The patient will be responsible for cost sharing (co-pay) of up to 20% of the service fee (after annual deductible is met).  Patient agreed to services and verbal consent obtained.   Nancy Blackburn verbalized understanding of the information discussed during the telephonic outreach. Declined need for mailed/printed instructions. A member of the care management team will follow up next month.   [name] Care Management [facility] [phone]

## 2021-12-01 ENCOUNTER — Inpatient Hospital Stay: Payer: Medicare Other

## 2021-12-01 ENCOUNTER — Other Ambulatory Visit: Payer: Self-pay

## 2021-12-01 ENCOUNTER — Inpatient Hospital Stay: Payer: Medicare Other | Attending: Obstetrics and Gynecology | Admitting: Obstetrics and Gynecology

## 2021-12-01 VITALS — BP 160/68 | HR 96 | Temp 97.3°F | Resp 18 | Wt 175.4 lb

## 2021-12-01 DIAGNOSIS — Z7952 Long term (current) use of systemic steroids: Secondary | ICD-10-CM | POA: Insufficient documentation

## 2021-12-01 DIAGNOSIS — G9341 Metabolic encephalopathy: Secondary | ICD-10-CM | POA: Insufficient documentation

## 2021-12-01 DIAGNOSIS — Z90710 Acquired absence of both cervix and uterus: Secondary | ICD-10-CM | POA: Insufficient documentation

## 2021-12-01 DIAGNOSIS — M5416 Radiculopathy, lumbar region: Secondary | ICD-10-CM | POA: Insufficient documentation

## 2021-12-01 DIAGNOSIS — I129 Hypertensive chronic kidney disease with stage 1 through stage 4 chronic kidney disease, or unspecified chronic kidney disease: Secondary | ICD-10-CM | POA: Insufficient documentation

## 2021-12-01 DIAGNOSIS — Z803 Family history of malignant neoplasm of breast: Secondary | ICD-10-CM

## 2021-12-01 DIAGNOSIS — M4722 Other spondylosis with radiculopathy, cervical region: Secondary | ICD-10-CM | POA: Insufficient documentation

## 2021-12-01 DIAGNOSIS — E538 Deficiency of other specified B group vitamins: Secondary | ICD-10-CM | POA: Diagnosis not present

## 2021-12-01 DIAGNOSIS — J45909 Unspecified asthma, uncomplicated: Secondary | ICD-10-CM | POA: Diagnosis not present

## 2021-12-01 DIAGNOSIS — E785 Hyperlipidemia, unspecified: Secondary | ICD-10-CM | POA: Diagnosis not present

## 2021-12-01 DIAGNOSIS — F324 Major depressive disorder, single episode, in partial remission: Secondary | ICD-10-CM | POA: Insufficient documentation

## 2021-12-01 DIAGNOSIS — Z808 Family history of malignant neoplasm of other organs or systems: Secondary | ICD-10-CM

## 2021-12-01 DIAGNOSIS — F17200 Nicotine dependence, unspecified, uncomplicated: Secondary | ICD-10-CM | POA: Diagnosis not present

## 2021-12-01 DIAGNOSIS — I3139 Other pericardial effusion (noninflammatory): Secondary | ICD-10-CM | POA: Insufficient documentation

## 2021-12-01 DIAGNOSIS — M6282 Rhabdomyolysis: Secondary | ICD-10-CM | POA: Insufficient documentation

## 2021-12-01 DIAGNOSIS — N83202 Unspecified ovarian cyst, left side: Secondary | ICD-10-CM

## 2021-12-01 DIAGNOSIS — Z800 Family history of malignant neoplasm of digestive organs: Secondary | ICD-10-CM | POA: Diagnosis not present

## 2021-12-01 DIAGNOSIS — F02A0 Dementia in other diseases classified elsewhere, mild, without behavioral disturbance, psychotic disturbance, mood disturbance, and anxiety: Secondary | ICD-10-CM | POA: Insufficient documentation

## 2021-12-01 DIAGNOSIS — K219 Gastro-esophageal reflux disease without esophagitis: Secondary | ICD-10-CM | POA: Insufficient documentation

## 2021-12-01 DIAGNOSIS — D72819 Decreased white blood cell count, unspecified: Secondary | ICD-10-CM

## 2021-12-01 DIAGNOSIS — Z836 Family history of other diseases of the respiratory system: Secondary | ICD-10-CM | POA: Diagnosis not present

## 2021-12-01 DIAGNOSIS — G20 Parkinson's disease: Secondary | ICD-10-CM | POA: Diagnosis not present

## 2021-12-01 DIAGNOSIS — N179 Acute kidney failure, unspecified: Secondary | ICD-10-CM | POA: Diagnosis not present

## 2021-12-01 DIAGNOSIS — G250 Essential tremor: Secondary | ICD-10-CM | POA: Diagnosis not present

## 2021-12-01 DIAGNOSIS — Z79899 Other long term (current) drug therapy: Secondary | ICD-10-CM | POA: Insufficient documentation

## 2021-12-01 DIAGNOSIS — Z8249 Family history of ischemic heart disease and other diseases of the circulatory system: Secondary | ICD-10-CM | POA: Diagnosis not present

## 2021-12-01 DIAGNOSIS — D509 Iron deficiency anemia, unspecified: Secondary | ICD-10-CM | POA: Diagnosis not present

## 2021-12-01 DIAGNOSIS — Z8541 Personal history of malignant neoplasm of cervix uteri: Secondary | ICD-10-CM | POA: Diagnosis not present

## 2021-12-01 DIAGNOSIS — E559 Vitamin D deficiency, unspecified: Secondary | ICD-10-CM | POA: Diagnosis not present

## 2021-12-01 NOTE — Progress Notes (Signed)
Gynecologic Oncology Consult Visit   Referring Provider: Dr Gardiner Rhyme  Chief Concern: left ovarian cystic mass, 8 cm  Subjective:  Nancy Blackburn is a 75 y.o. female who is seen in consultation from Dr. Gilman Schmidt for left ovarian cyst.  Seen by Dr Gardiner Rhyme after hospital admission showed a left ovarian cystic mass, 8 cm.  Patient reports that she was hospitalized recently 10/17/21 for urosepsis and kidney failure, she was at an outpatient facility recovering for about a week after her admission.  She has not had symptoms of a pelvic mass.  She denies any changes in appetite.  She denies any unexplained weight loss.  She denies early satiety.  She denies changes in bowel movements.   She reports that she had a history of cervical cancer in her 55s.  She had a cervical cone that did not remove all of the cancer and for that reason a hysterectomy was recommended.  She reports that she retained her ovaries.  She denies having any chemo or radiation at the time of her cervical cancer diagnosis.     She reports that her last pelvic exam was 10 years ago or so.  She had had a ultrasound which had shown a ovarian cyst but follow-up was not performed because the ovarian was not felt at the time of exam.   She has some dsypnea, urinary incontinence and frequency, night sweats, chronic memory loss and accompanied by a friend. Joint pain.   Pelvic US 10/22/21 FINDINGS: Uterus Surgically absent   Endometrium Surgically absent   Right ovary Not visualized, likely obscured by bowel   Left ovary No normal appearing ovary visualized, see Blackburn  Other findings No free pelvic fluid. Large LEFT adnexal cyst potentially of ovarian origin, 8.0 x 8.2 x 7.8 cm in size. Imaging demonstrates a partial septation and question debris dependently. No other complicating features. No abnormal blood flow on color Doppler imaging.   IMPRESSION: Surgical absence of uterus with nonvisualization of RIGHT ovary.    8.0 cm diameter mildly complicated cystic lesion of the LEFT adnexa potentially of ovarian origin, corresponding to CT finding. Lesion is concerning for a low-grade cystic LEFT ovarian neoplasm. Gynecological evaluation is recommended.  10/23/21 Creatinine 2.63 that day.  Component Ref Range & Units 1 mo ago   Cancer Antigen (CA) 125 0.0 - 38.1 U/mL 13.2   Comment: (NOTE)  Roche [employer] Electrochemiluminescence Immunoassay (ECLIA)  Values obtained with different assay methods or kits cannot be  used interchangeably.  Results cannot be interpreted as absolute  evidence of the presence or absence of malignant disease.   HE4 0.0 - 96.9 pmol/L 868.0 High    Comment: (NOTE)  [employer] Electrochemiluminescence Immunoassay (ECLIA)  Values obtained with different assay methods or kits cannot be  used interchangeably.  Results cannot be interpreted as absolute  evidence of the presence or absence of malignant disease.   Premenopausal ROMA See Blackburn 9.86 High  VC   Postmenopausal ROMA See Blackburn 6.98 High  VC   Comment  Comment VC   Comment: (NOTE)     8/16 pelvic US for RLQ IMPRESSION: 1. Hysterectomy.   2. Right ovary is not definitely identified. 3.4 x 2.4 x 2.4 cm right adnexal cyst. This could represent ovarian cyst. This is almost certainly benign, but follow up ultrasound is recommended in 1 year according to the Society of Radiologists in [place] Statement Gordy Levan et al. Management of Asymptomatic Ovarian and Other Adnexal Cysts Imaged at [place]:  Society of Radiologists in [place] 2010. Radiology 256 (Sept 2010): 756-433.). If further evaluation of this cyst is deemed clinically necessary at this time MRI of the pelvis can be obtained.   3.  Left ovary not identified.  Problem List: Patient Active Problem List   Diagnosis Date Noted   Cyst of left ovary    Acute pain of right knee    Elevated LFTs     Acute metabolic encephalopathy    Sepsis due to Proteus species ([place]) 10/18/2021   Rhabdomyolysis 10/18/2021   Acute kidney injury ([place]) 10/18/2021   Leukocytosis 10/18/2021   Asthma 07/02/2021   S/P TKR (total knee replacement) using cement, right 03/09/2021   Bradycardia 07/03/2020   Postural dizziness with presyncope 07/01/2020   Family history of colon cancer in mother 02/22/2018   Cervical spondylosis 05/04/2017   Cervical radiculopathy 05/04/2017   Family history of Cushing disease 01/12/2017   B12 deficiency 12/18/2016   Mild cognitive impairment with memory loss 12/18/2016   Parkinsonian features 12/18/2016   Vitamin D deficiency 12/18/2016   Chalazion, bilateral 06/08/2016   Absolute anemia 07/30/2015   Edema extremities 07/30/2015   Benign essential tremor 07/30/2015   Anxiety, generalized 07/30/2015   Gastro-esophageal reflux disease without esophagitis 07/30/2015   HLD (hyperlipidemia) 07/30/2015   Cannot sleep 07/30/2015   Lumbar radiculopathy 07/30/2015   Depression, major, recurrent, in partial remission ([place]) 07/30/2015   Arthritis, degenerative 07/30/2015   Allergic rhinitis 07/30/2015   Gastroduodenal ulcer 07/30/2015   Calcium blood increased 07/30/2015   Pre-syncope 05/17/2015   Dyspnea 03/31/2015   Bleeding stomach ulcer 10/14/2013   Anemia, iron deficiency 10/14/2013   Weakness 10/14/2013   Ejection fraction    Pericardial effusion    Palpitations    Low back pain    Normal nuclear stress test     Past Medical History: Past Medical History:  Diagnosis Date   Asthma    Bradycardia    Cervical cancer ([place]) 1978   Chronic sinus infection    Ejection fraction    a. EF 55-65%, echo, mild LVH, June, 2013, small pericardial effusion   Fracture, sacrum/coccyx ([place])    History of kidney stones    Hyperlipidemia    a. takes Simvastatin   Hypertension    a. takes Metoprolol, Lisinopril, and amlodipine.   Low back pain    Mild dementia    Nasal  polyps    Normal nuclear stress test    a. 07/2005 Nl nuc stress test.   Osteopenia    Osteoporosis    Palpitations    a. June 2013:  Event recorder normal sinus rhythm with rare PVCs - ? symptomatic PVC's.   Parkinson disease ([place])    Pericardial effusion    a. Small, echo, June, 2013   PONV (postoperative nausea and vomiting)     Past Surgical History: Past Surgical History:  Procedure Laterality Date   BACK SURGERY  08/2010   BLADDER SUSPENSION  2003   bone spur  2009   removed from right collar bone area and partial collar bone removed   CATARACT EXTRACTION W/ INTRAOCULAR LENS  IMPLANT, BILATERAL     EYE SURGERY     LUMBAR FUSION  2011, 2012   SHOULDER ARTHROSCOPY Right 2012   THYROIDECTOMY, PARTIAL  1976   TONSILLECTOMY     as child   TOTAL KNEE ARTHROPLASTY Right 03/09/2021   Procedure: Right TOTAL KNEE ARTHROPLASTY;  Surgeon: Thornton Park, MD;  Location: [facility] ORS;  Service: Orthopedics;  Laterality: Right;   TUBAL LIGATION  1977   VAGINAL HYSTERECTOMY  1978   d/t cervical cancer      OB History:  OB History  Gravida Para Term Preterm AB Living  3 2 2   1 2   SAB IAB Ectopic Multiple Live Births          2    # Outcome Date GA Lbr Len/2nd Weight Sex Delivery Anes PTL Lv  3 Term 06/12/73    M Vag-Spont   LIV  2 Term 08/26/66    F Vag-Spont   LIV  1 AB 1966            Family History: Family History  Problem Relation Age of Onset   Colon cancer Mother    Heart murmur Mother    Heart disease Mother    Angina Mother    Heart disease Father    COPD Father    Atrial fibrillation Brother    Cushing syndrome Daughter    Cancer Daughter        thyroid   Breast cancer Cousin    Hypotension Neg Hx    Malignant hyperthermia Neg Hx    Pseudochol deficiency Neg Hx     Social History: Social History   Socioeconomic History   Marital status: Divorced    Spouse name: Not on file   Number of children: 2   Years of education: Not on file   Highest  education level: 12th grade  Occupational History   Occupation: retired  Tobacco Use   Smoking status: Never   Smokeless tobacco: Never  Vaping Use   Vaping Use: Never used  Substance and Sexual Activity   Alcohol use: Yes    Alcohol/week: 0.0 standard drinks    Comment: rarely   Drug use: No   Sexual activity: Never  Other Topics Concern   Not on file  Social History Narrative   Not on file   Social Determinants of Health   Financial Resource Strain: Not on file  Food Insecurity: No Food Insecurity   Worried About Running Out of Food in the Last Year: Never true   Ran Out of Food in the Last Year: Never true  Transportation Needs: No Transportation Needs   Lack of Transportation (Medical): No   Lack of Transportation (Non-Medical): No  Physical Activity: Not on file  Stress: Not on file  Social Connections: Not on file  Intimate Partner Violence: Not on file    Allergies: Allergies  Allergen Reactions   Anti-Inflammatory Enzyme [Nutritional Supplements] Other (See Comments)    REACTION: Due to bleeding ulcer   Cymbalta [Duloxetine Hcl] Other (See Comments)    REACTION: Urinary retention   Clarithromycin Other (See Comments)    Upsets stomach   Donepezil Hcl Other (See Comments)    Significant bradycardia causing syncope   Nsaids     GI BLEED   Ranitidine     severe diarrhea   Venlafaxine     severe depression    Current Medications: Current Outpatient Medications  Medication Sig Dispense Refill   acetaminophen (TYLENOL) 325 MG tablet Take 1-2 tablets (325-650 mg total) by mouth every 6 (six) hours as needed for mild pain (pain score 1-3 or temp > 100.5). 60 tablet 1   albuterol (VENTOLIN HFA) 108 (90 Base) MCG/ACT inhaler Inhale 1-2 puffs into the lungs every 6 (six) hours as needed for wheezing or shortness of breath. 8 g 2   amLODipine (NORVASC)  10 MG tablet Take 1 tablet (10 mg total) by mouth daily. 30 tablet 0   Budeson-Glycopyrrol-Formoterol (BREZTRI  AEROSPHERE) 160-9-4.8 MCG/ACT AERO Inhale 160 mcg into the lungs in the morning and at bedtime. 5.9 g 0   busPIRone (BUSPAR) 10 MG tablet Take 1 tablet (10 mg total) by mouth 2 (two) times daily. 60 tablet 0   Calcium Carb-Cholecalciferol (CALCIUM 600+D3) 600-800 MG-UNIT TABS Take 1 tablet by mouth daily.     cholecalciferol (VITAMIN D) 25 MCG tablet Take 1 tablet (1,000 Units total) by mouth daily. 30 tablet 0   citalopram (CELEXA) 40 MG tablet Take 40 mg by mouth daily.     fexofenadine (ALLEGRA) 180 MG tablet Take 180 mg by mouth daily.     hydrochlorothiazide (HYDRODIURIL) 12.5 MG tablet Take 1 tablet (12.5 mg total) by mouth every morning. 90 tablet 1   lidocaine (LIDODERM) 5 % Place 1 patch onto the skin daily. Remove & Discard patch within 12 hours or as directed by MD (Patient not taking: Reported on 11/25/2021) 30 patch 0   LORazepam (ATIVAN) 0.5 MG tablet Take 1 tablet (0.5 mg total) by mouth 3 (three) times daily as needed for anxiety. 30 tablet 0   oxyCODONE (OXY IR/ROXICODONE) 5 MG immediate release tablet Take 2.5 mg by mouth every 6 (six) hours as needed. (Patient not taking: Reported on 11/25/2021)     predniSONE (DELTASONE) 10 MG tablet Take by mouth. (Patient not taking: Reported on 11/25/2021)     traZODone (DESYREL) 100 MG tablet Take 100 mg by mouth at bedtime.     vitamin B-12 (CYANOCOBALAMIN) 1000 MCG tablet Take 1,000 mcg by mouth daily.     No current facility-administered medications for this visit.    Review of Systems General: negative for, fevers, chills, fatigue, changes in sleep, changes in weight or appetite Skin: negative for changes in color, texture, moles or lesions Eyes: negative for, changes in vision, pain, diplopia HEENT: negative for, change in hearing, pain, discharge, tinnitus, vertigo, voice changes, sore throat, neck masses Pulmonary: negative for, orthopnea, productive cough Cardiac: negative for, palpitations, syncope, pain, discomfort,  pressure Gastrointestinal: negative for, dysphagia, nausea, vomiting, jaundice, pain, constipation, diarrhea, hematemesis, hematochezia Genitourinary/Sexual: negative for, dysuria, discharge, hesitancy, nocturia, retention, stones, infections, STD's Ob/Gyn: negative for, irregular bleeding, pain Musculoskeletal: negative for, pain, stiffness, swelling, range of motion limitation Hematology: negative for, easy bruising, bleeding Neurologic/Psych: negative for, headaches, seizures, paralysis, weakness, tremor, change in gait, change in sensation, mood swings, depression, anxiety, change in memory  Objective:  Physical Examination:  BP (!) 160/68    Pulse 96    Temp (!) 97.3 F (36.3 C)    Resp 18    Wt 175 lb 6.4 oz (79.6 kg)    SpO2 97%    BMI 27.47 kg/m    ECOG Performance Status: 1 - Symptomatic but completely ambulatory  General appearance: alert, cooperative, and appears stated age HEENT:PERRLA and thyroid without masses Lymph node survey: non-palpable, axillary, inguinal, supraclavicular Cardiovascular: regular rate and rhythm, no murmurs or gallops Respiratory: normal air entry, lungs clear to auscultation Abdomen: soft, non-tender, without masses or organomegaly, no hernias, and well healed incision Back: inspection of back is normal Extremities: extremities normal, atraumatic, no cyanosis or edema Skin exam - normal coloration and turgor, no rashes, no suspicious skin lesions noted. Neurological exam reveals alert, oriented, normal speech, no focal findings or movement disorder noted.  Pelvic: exam chaperoned by nurse, EGBUS within normal limits;  Vulva: normal appearing vulva  with no masses, tenderness or lesions; Vagina: normal vagina; Adnexa: cystic mass palpable above vagina, non tender Rectal: confirms   Lab Review Lab Results  Component Value Date   WBC 7.5 11/10/2021   HGB 12.4 11/10/2021   HCT 37.7 11/10/2021   MCV 88 11/10/2021   PLT 430 11/10/2021     Chemistry       Component Value Date/Time   NA 143 11/10/2021 1508   NA 136 10/28/2014 1755   K 3.7 11/10/2021 1508   K 4.1 10/28/2014 1755   CL 102 11/10/2021 1508   CL 102 10/28/2014 1755   CO2 26 11/10/2021 1508   CO2 34 (H) 10/28/2014 1755   BUN 10 11/10/2021 1508   BUN 16 10/28/2014 1755   CREATININE 1.07 (H) 11/10/2021 1508   CREATININE 1.02 (H) 11/23/2017 1435      Component Value Date/Time   CALCIUM 9.3 11/10/2021 1508   CALCIUM 9.1 10/28/2014 1755   ALKPHOS 139 (H) 11/10/2021 1508   ALKPHOS 73 10/05/2013 0553   AST 13 11/10/2021 1508   AST 13 (L) 10/05/2013 0553   ALT 6 11/10/2021 1508   ALT 8 (L) 10/05/2013 0553   BILITOT 0.3 11/10/2021 1508   BILITOT 0.2 10/05/2013 0553         Assessment:  Nancy Blackburn is a 75 y.o. female diagnosed with 8 cm left ovarian cyst with septation.  No solid areas or doppler flow.  Found on imaging in hospital 10/18/21 for renal failure and urosepsis. Elevated HE4, but had acute on chronic renal failure. Had RLQ discomfort in 2016 and US showed 3.4 cm right ovarian cyst.   Prior hysterectomy for CIN.   Medical co-morbidities complicating care: memory loss, recent urosepsis admission with elevated Cr.  Plan:   Problem List Items Addressed This Visit       Endocrine   Cyst of left ovary - Primary   Relevant Orders   Human Epididymis Prot 4,Serial    We discussed options for management including repeating the HE4 to see if it has gone down now that her creatinine is much lower, as this can elevate HE4 levels dramatically.  Normal CA125 is reassuring.  Discussed with Dr Gilman Schmidt and patient/friend that although an 8 cm cystic ovarian mass could be removed with LS, she is asymptomatic and the [place] is reassuring that this is benign.  So in view of her overall health being marginal including significant memory loss, would recommend repeat US in 3 months with Dr Gardiner Rhyme if HE4 declining appropriately with a plan for expectant mangement.  If HE4  does not come down or mass grows then LS surgery would be advised. .   She is in agreement with this plan and her friend also participated in the conversation. I discussed with Dr Gardiner Rhyme.  The patient's diagnosis, an outline of the further diagnostic and laboratory studies which will be required, the recommendation, and alternatives were discussed.  All questions were answered to the patient's satisfaction.  A total of 60 minutes were spent with the patient/family today; 40% was spent in education, counseling and coordination of care for left ovarian cystic mass.    Mellody Drown, MD  CC:  [address], [address],  [place] [address] [phone]

## 2021-12-03 LAB — HUMAN EPIDIDYMIS PROT 4,SERIAL: HE4: 146 pmol/L — ABNORMAL HIGH (ref 0.0–96.9)

## 2021-12-11 DIAGNOSIS — J45909 Unspecified asthma, uncomplicated: Secondary | ICD-10-CM

## 2021-12-11 DIAGNOSIS — I10 Essential (primary) hypertension: Secondary | ICD-10-CM

## 2021-12-11 DIAGNOSIS — E785 Hyperlipidemia, unspecified: Secondary | ICD-10-CM

## 2021-12-14 DIAGNOSIS — N179 Acute kidney failure, unspecified: Secondary | ICD-10-CM | POA: Diagnosis not present

## 2021-12-15 LAB — COMPREHENSIVE METABOLIC PANEL
ALT: 8 IU/L (ref 0–32)
AST: 14 IU/L (ref 0–40)
Albumin/Globulin Ratio: 1.4 (ref 1.2–2.2)
Albumin: 4 g/dL (ref 3.7–4.7)
Alkaline Phosphatase: 125 IU/L — ABNORMAL HIGH (ref 44–121)
BUN/Creatinine Ratio: 14 (ref 12–28)
BUN: 14 mg/dL (ref 8–27)
Bilirubin Total: 0.3 mg/dL (ref 0.0–1.2)
CO2: 24 mmol/L (ref 20–29)
Calcium: 9.5 mg/dL (ref 8.7–10.3)
Chloride: 103 mmol/L (ref 96–106)
Creatinine, Ser: 1.01 mg/dL — ABNORMAL HIGH (ref 0.57–1.00)
Globulin, Total: 2.9 g/dL (ref 1.5–4.5)
Glucose: 123 mg/dL — ABNORMAL HIGH (ref 70–99)
Potassium: 3.7 mmol/L (ref 3.5–5.2)
Sodium: 144 mmol/L (ref 134–144)
Total Protein: 6.9 g/dL (ref 6.0–8.5)
eGFR: 58 mL/min/{1.73_m2} — ABNORMAL LOW (ref >59)

## 2021-12-20 ENCOUNTER — Other Ambulatory Visit: Payer: Self-pay | Admitting: Family Medicine

## 2021-12-20 NOTE — Telephone Encounter (Signed)
Please advise refill?  LOV: 11/10/2021 NOV: 03/09/2022 Last refill: 10/26/2021 #30 w/0 refills

## 2021-12-21 ENCOUNTER — Other Ambulatory Visit: Payer: Self-pay

## 2021-12-21 ENCOUNTER — Encounter: Payer: Self-pay | Admitting: Pulmonary Disease

## 2021-12-21 ENCOUNTER — Ambulatory Visit (INDEPENDENT_AMBULATORY_CARE_PROVIDER_SITE_OTHER): Payer: Medicare Other | Admitting: Pulmonary Disease

## 2021-12-21 VITALS — BP 140/89 | HR 89 | Temp 97.1°F | Ht 67.0 in | Wt 171.0 lb

## 2021-12-21 DIAGNOSIS — I2510 Atherosclerotic heart disease of native coronary artery without angina pectoris: Secondary | ICD-10-CM | POA: Diagnosis not present

## 2021-12-21 DIAGNOSIS — I2584 Coronary atherosclerosis due to calcified coronary lesion: Secondary | ICD-10-CM

## 2021-12-21 DIAGNOSIS — R0602 Shortness of breath: Secondary | ICD-10-CM

## 2021-12-21 DIAGNOSIS — J4530 Mild persistent asthma, uncomplicated: Secondary | ICD-10-CM

## 2021-12-21 NOTE — Progress Notes (Signed)
Subjective:    Patient ID: Nancy Blackburn, female    DOB: [birth year], 76 y.o.   MRN: [id number]  Requesting MD/Service: Miguel Aschoff Date of initial consultation: 06/10/16 by Dr. Merton Border Reason for consultation: Dyspnea   PT PROFILE: 76 y.o. F, lifelong never smoker, referred for evaluation of unexplained exertional dyspnea.    DATA: 04/14/16 Echocardiogram : LVEF 14-78%, grade 2 diastolic dysfunction, LA mildly dilated 04/15/15 Myoview : no evidence of ischemia 07/14/16 PFTs : Normal spirometry, mild restriction, normal DLCO 07/14/16 6 MWT : 336 meters. Limited by arthritic pain in knees > dyspnea 07/03/2020 echocardiogram: EF 60 to 65%, grade 1 DD, there was evidence of increased right ventricular pressure 09/15/2020 long-term monitor: Frequent PVCs/ventricular ectopy, symptomatic 08/26/2021 PFTs: Essentially normal 09/28/2021 echocardiogram: LVEF 60 to 65% LVH grade 1 DD normal RV function, mildly elevated RV systolic pressure, mild MR, TR, aortic sclerosis.     Interim:  Prior visit was 16 November 2021 at that time she noted that she had had episode of sepsis due to Proteus mirabilis and admitted to [facility] from 19 October 2019  to 26 October 2021.  She had acute renal failure and rhabdomyolysis during that admission.  In the interim however no other new issues.  HPI Nancy Blackburn is a 76 year old lifelong never smoker who presents for follow-up on the issue of dyspnea on exertion.  She has mild persistent asthma with an element of asthmatic bronchitis.  She has been on Breztri first provided by her primary care physician.  Notes that this medication helps her.  She has been maintained on this medication as it is covered by her insurance.  She does note some dyspnea on heavy exertion this continues to be an issue.  This is out of proportion to her PFT findings which are mild.  She has had increasing stressors in her life due to her daughter being hospitalized for the last 2  months and recently having had to put down her dog for 15 years.  She notes that the stressors have fatigue her.  She has not had any cough, sputum production, hemoptysis or any other issues.  No chest pain.  No calf tenderness nor lower extremity edema.  At her last visit she was requiring a walker for ambulation but she has been able to progress from this.  She does not note any other symptomatology.  Of note on a prior CT angio chest in April 2022 she had significant coronary artery calcifications however this was not a gated study.   Review of Systems A 10 point review of systems was performed and it is as noted above otherwise negative.   Patient Active Problem List   Diagnosis Date Noted   Cyst of left ovary    Acute pain of right knee    Elevated LFTs    Acute metabolic encephalopathy    Sepsis due to Proteus species ([place]) 10/18/2021   Rhabdomyolysis 10/18/2021   Acute kidney injury ([place]) 10/18/2021   Leukocytosis 10/18/2021   Asthma 07/02/2021   S/P TKR (total knee replacement) using cement, right 03/09/2021   Bradycardia 07/03/2020   Postural dizziness with presyncope 07/01/2020   Family history of colon cancer in mother 02/22/2018   Cervical spondylosis 05/04/2017   Cervical radiculopathy 05/04/2017   Family history of Cushing disease 01/12/2017   B12 deficiency 12/18/2016   Mild cognitive impairment with memory loss 12/18/2016   Parkinsonian features 12/18/2016   Vitamin D deficiency 12/18/2016   Chalazion, bilateral 06/08/2016  Absolute anemia 07/30/2015   Edema extremities 07/30/2015   Benign essential tremor 07/30/2015   Anxiety, generalized 07/30/2015   Gastro-esophageal reflux disease without esophagitis 07/30/2015   HLD (hyperlipidemia) 07/30/2015   Cannot sleep 07/30/2015   Lumbar radiculopathy 07/30/2015   Depression, major, recurrent, in partial remission ([place]) 07/30/2015   Arthritis, degenerative 07/30/2015   Allergic rhinitis 07/30/2015    Gastroduodenal ulcer 07/30/2015   Calcium blood increased 07/30/2015   Pre-syncope 05/17/2015   Dyspnea 03/31/2015   Bleeding stomach ulcer 10/14/2013   Anemia, iron deficiency 10/14/2013   Weakness 10/14/2013   Ejection fraction    Pericardial effusion    Palpitations    Low back pain    Normal nuclear stress test    Social History   Tobacco Use   Smoking status: Never   Smokeless tobacco: Never  Substance Use Topics   Alcohol use: Yes    Alcohol/week: 0.0 standard drinks    Comment: rarely   Allergies  Allergen Reactions   Anti-Inflammatory Enzyme [Nutritional Supplements] Other (See Comments)    REACTION: Due to bleeding ulcer   Cymbalta [Duloxetine Hcl] Other (See Comments)    REACTION: Urinary retention   Clarithromycin Other (See Comments)    Upsets stomach   Donepezil Hcl Other (See Comments)    Significant bradycardia causing syncope   Nsaids     GI BLEED   Ranitidine     severe diarrhea   Venlafaxine     severe depression   Current Meds  Medication Sig   acetaminophen (TYLENOL) 325 MG tablet Take 1-2 tablets (325-650 mg total) by mouth every 6 (six) hours as needed for mild pain (pain score 1-3 or temp > 100.5).   albuterol (VENTOLIN HFA) 108 (90 Base) MCG/ACT inhaler Inhale 1-2 puffs into the lungs every 6 (six) hours as needed for wheezing or shortness of breath.   amLODipine (NORVASC) 10 MG tablet Take 1 tablet (10 mg total) by mouth daily.   Budeson-Glycopyrrol-Formoterol (BREZTRI AEROSPHERE) 160-9-4.8 MCG/ACT AERO Inhale 160 mcg into the lungs in the morning and at bedtime.   busPIRone (BUSPAR) 10 MG tablet Take 1 tablet (10 mg total) by mouth 2 (two) times daily.   Calcium Carb-Cholecalciferol (CALCIUM 600+D3) 600-800 MG-UNIT TABS Take 1 tablet by mouth daily.   cholecalciferol (VITAMIN D) 25 MCG tablet Take 1 tablet (1,000 Units total) by mouth daily.   citalopram (CELEXA) 40 MG tablet Take 40 mg by mouth daily.   fexofenadine (ALLEGRA) 180 MG tablet  Take 180 mg by mouth daily.   hydrochlorothiazide (HYDRODIURIL) 12.5 MG tablet Take 1 tablet (12.5 mg total) by mouth every morning.   LORazepam (ATIVAN) 0.5 MG tablet TAKE 1 TABLET BY MOUTH 3 TIMES DAILY AS NEEDED FOR ANXIETY   traZODone (DESYREL) 100 MG tablet Take 100 mg by mouth at bedtime.   vitamin B-12 (CYANOCOBALAMIN) 1000 MCG tablet Take 1,000 mcg by mouth daily.   Immunization History  Administered Date(s) Administered   Fluad Quad(high Dose 65+) 10/21/2019, 10/14/2020, 08/25/2021   Influenza Inj Mdck Quad Pf 10/27/2016   Influenza Split 10/05/2011, 10/10/2012   Influenza, High Dose Seasonal PF 09/04/2014, 09/01/2015, 10/05/2017, 10/10/2018   Influenza-Unspecified 10/12/2013, 08/12/2014, 09/04/2014   PFIZER(Purple Top)SARS-COV-2 Vaccination 02/06/2020, 03/03/2020, 11/11/2020   Pneumococcal Conjugate-13 03/26/2015   Pneumococcal Polysaccharide-23 10/11/1999, 04/10/2012   Td 08/23/1999   Tdap 05/20/2019   Tetanus 05/28/2019   Zoster, Live 09/26/2008        Objective:   Physical Exam BP 140/89 (BP Location: Left Arm, Patient  Position: Sitting, Cuff Size: Normal)    Pulse 89    Temp (!) 97.1 F (36.2 C) (Oral)    Ht 5\' 7"  (1.702 m)    Wt 171 lb (77.6 kg)    SpO2 97%    BMI 26.78 kg/m  GENERAL: Well-developed, well-nourished woman, chronically ill-appearing, no acute distress, no overt respiratory distress.  No conversational dyspnea per se.  Fully ambulatory without assistance.   HEAD: Normocephalic, atraumatic.  EYES: Pupils equal, round, reactive to light.  No scleral icterus.  Mild periorbital edema MOUTH: Nose/mouth/throat not examined due to masking requirements for COVID 19. NECK: Supple. No thyromegaly. Trachea midline. No JVD.  No adenopathy. PULMONARY: Good air entry bilaterally.  No adventitious sounds. CARDIOVASCULAR: S1 and S2. Regular rate and rhythm.  Grade 2/6 holosystolic murmur, left sternal border. ABDOMEN: Mild truncal obesity otherwise  benign. MUSCULOSKELETAL: No joint deformity, no clubbing, no edema.  NEUROLOGIC: Mild resting tremor, no overt focal deficit, gait normal.  SKIN: Intact,warm,dry. PSYCH: Normal mood, normal behavior.     Assessment & Plan:     ICD-10-CM   1. Mild persistent asthma without complication  Z61.09    Continue Breztri 2 puffs twice a day Continue as needed albuterol From this standpoint patient is well compensated    2. Shortness of breath  R06.02    Query cardiac component Patient does have coronary artery calcifications on on gated CT chest Revisit with cardiology Dr. Aaron Edelman Agbor-Etang    3. Coronary artery calcification  I25.10 Ambulatory referral to Cardiology   I25.84    Revisit with cardiology query CAD     Orders Placed This Encounter  Procedures   Ambulatory referral to Cardiology    Referral Priority:   Routine    Referral Type:   Consultation    Referral Reason:   Specialty Services Required    Referred to Provider:   Kate Sable, MD    Requested Specialty:   Cardiology    Number of Visits Requested:   1   Patient is to continue her medications as they are.  We will refer her back to cardiology to revisit the issue of coronary artery calcifications query significant CAD.  Her symptoms are out of proportion to her very mild findings on PFTs.  Patient will be seen in follow-up in 3 months time call sooner should any new problems arise.  Renold Don, MD Advanced Bronchoscopy PCCM  [facility]    *This note was dictated using voice recognition software/Dragon.  Despite best efforts to proofread, errors can occur which can change the meaning. Any transcriptional errors that result from this process are unintentional and may not be fully corrected at the time of dictation.

## 2021-12-21 NOTE — Patient Instructions (Signed)
We have requested reevaluation by Dr.Agbor (cardiologist)  Continue using Breztri and as needed albuterol.  We will see him in follow-up in 3 months time call sooner should any new problems arise.

## 2021-12-23 NOTE — Chronic Care Management (AMB) (Signed)
°  Chronic Care Management   Note  12/23/2021 Name: Nancy Blackburn MRN: [id number] DOB: 1946-10-06  Nancy Blackburn is a 76 y.o. year old female who is a primary care patient of Jerrol Banana., MD. Nancy Blackburn is currently enrolled in care management services. An additional referral for Pharm D was placed.   Follow up plan: Telephone appointment with care management team member scheduled for: 12/28/2021  Julian Hy, [place] [facility]  Direct Dial: [phone]

## 2021-12-24 DIAGNOSIS — M25561 Pain in right knee: Secondary | ICD-10-CM | POA: Diagnosis not present

## 2021-12-27 ENCOUNTER — Telehealth: Payer: Medicare Other

## 2021-12-27 ENCOUNTER — Telehealth: Payer: Self-pay

## 2021-12-27 NOTE — Telephone Encounter (Signed)
°  Care Management   Follow Up Note   12/27/2021 Name: Nancy Blackburn MRN: [id number] DOB: 03/15/1946   Primary Care Provider: Jerrol Banana., MD Reason for referral : Chronic Care Management   An unsuccessful telephone outreach was attempted today. The patient was referred to the case management team for assistance with care management and care coordination.    Follow Up Plan:  A HIPAA compliant voice message was left today requesting a return call.    [name] Care Management [facility] [phone]

## 2021-12-27 NOTE — Progress Notes (Signed)
Chronic Care Management Pharmacy Assistant   Name: Nancy Blackburn  MRN: [id number] DOB: Aug 13, 1946  Chart Review for Clinical pharmacist on 12/28/2021 at 9:30 am.  Conditions to be addressed/monitored: HLD, Anxiety, Depression, GERD, Asthma, Allergic Rhinitis, and Pericardial effusion, Cervical radiculopathy, Anemia, B12 deficiency, Parkinsonian deficiency, Vitamin D deficiency  Primary concerns for visit include: Affordability issues with Breo Inhaler   Recent office visits:  11/25/2021 Neldon Labella RN (CCM) No Medication Changes noted 11/19/2021 Neldon Labella RN (CCM) No Medication Changes noted 11/10/2021 Dr. Rosanna Randy MD (PCP) No Medication Changes noted, AMB Referral to [facility], Return in about 3 months  07/29/2021 Dr. Rosanna Randy MD (PCP) start prednisone 20 mg for 5 days, start  doxycycline 100 mg for 5 days, methyiprednisololne acetate 40 mg injection given,  07/21/2021 Dr. Rosanna Randy MD (PCP) No Medication Changes noted   Recent consult visits:  12/21/2021 Dr. Patsey Berthold MD (Pulmonology) No Medication changes noted, Ambulatory referral to cardiology, follow-up in 3 months  12/01/2021 Dr. Chilton Si MD (Oncology) No Medication Changes noted 11/22/2021 Dr. Gilman Schmidt MD (OBGYN) No Medication Changes noted, Ambulatory referral to Hematology/Oncology  11/16/2021 Dr. Patsey Berthold MD (Pulmonology) No Medication Changes noted, Follow up on 4-6 weeks. 10/08/2021 Dr. Sharol Harness MD (Cardiology) No Medication changes noted, Follow up in 6 months. 09/30/2021 Dr. Patsey Berthold MD (Pulmonology) Restart Judithann Sauger 160-9-4.8 MCG/ACT 2 puffs twice a day, Follow-up in 2 months time 08/24/2021 Dr. Patsey Berthold MD (Pulmonology) No Medication Changes noted, follow up in 4-6 weeks. 07/06/2021 Tammy Parrett NP (Pulmonary)  Change  fluticasone furoate-vilanterol  to Advair 100-50 MCG/ACT 2 times daily due to coverage.  07/02/2021 Tammy Parrett NP (Pulmonary) Start albuterol 108 MCG/ACT PRN, start  fluticasone furoate-vilanterol 100-25 MCG/INH for one day, Follow up in 6-8 weeks.  Hospital visits:  Medication Reconciliation was completed by comparing discharge summary, patients EMR and Pharmacy list, and upon discussion with patient.  Admitted to the hospital on 10/18/2021 due to [place] due to Proteus species. Discharge date was 10/26/2021. Discharged from [place]?Medications Started at [facility] Discharge:?? -started Duricef 500 mg 2 tablets twice a day through 10/28/2021   Medication Changes at Hospital Discharge: -Changed amlodipine 10 mg daily  Medications Discontinued at Hospital Discharge: -Stopped rosuvastatin  Medications that remain the same after Hospital Discharge:??  -All other medications will remain the same.    Have you seen any other providers since your last visit?   Patient denies seeing any other providers since her last visit.  Any changes in your medications or health?   Patient denies any changes in her medications or health.  Any side effects from any medications?   Patient denies any side effects from her current medication.  Do you have an symptoms or problems not managed by your medications?   Patient denies any issue or problems at this time.  Any concerns about your health right now?   Patient denies any concerns at this time.  Has your provider asked that you check blood pressure, blood sugar, or follow special diet at home?   Patient states she was checking her blood pressure, but she needs to changes the batteries in her meter.   Do you get any type of exercise on a regular basis?   Patient states she was exercising, but has not been able to because her personal trainer is recovering from a injury.   Can you think of a goal you would like to reach for your health?   Patient will inform the clinical  pharmacist.  Do you have any problems getting your medications?   Patient states she is having affordability  issues with her Breo inhaler.  Is there anything that you would like to discuss during the appointment?   Affordability issues with Breo Inhaler  Please bring medications and supplements to appointment  Patient is aware to have all medications and supplements at her appointment.   Medications: Outpatient Encounter Medications as of 12/27/2021  Medication Sig Note   acetaminophen (TYLENOL) 325 MG tablet Take 1-2 tablets (325-650 mg total) by mouth every 6 (six) hours as needed for mild pain (pain score 1-3 or temp > 100.5).    albuterol (VENTOLIN HFA) 108 (90 Base) MCG/ACT inhaler Inhale 1-2 puffs into the lungs every 6 (six) hours as needed for wheezing or shortness of breath.    amLODipine (NORVASC) 10 MG tablet Take 1 tablet (10 mg total) by mouth daily.    Budeson-Glycopyrrol-Formoterol (BREZTRI AEROSPHERE) 160-9-4.8 MCG/ACT AERO Inhale 160 mcg into the lungs in the morning and at bedtime.    busPIRone (BUSPAR) 10 MG tablet Take 1 tablet (10 mg total) by mouth 2 (two) times daily.    Calcium Carb-Cholecalciferol (CALCIUM 600+D3) 600-800 MG-UNIT TABS Take 1 tablet by mouth daily.    cholecalciferol (VITAMIN D) 25 MCG tablet Take 1 tablet (1,000 Units total) by mouth daily.    citalopram (CELEXA) 40 MG tablet Take 40 mg by mouth daily.    fexofenadine (ALLEGRA) 180 MG tablet Take 180 mg by mouth daily.    hydrochlorothiazide (HYDRODIURIL) 12.5 MG tablet Take 1 tablet (12.5 mg total) by mouth every morning.    LORazepam (ATIVAN) 0.5 MG tablet TAKE 1 TABLET BY MOUTH 3 TIMES DAILY AS NEEDED FOR ANXIETY    traZODone (DESYREL) 100 MG tablet Take 100 mg by mouth at bedtime. 11/25/2021: Reports being instructed to take 2 tablets at night.   vitamin B-12 (CYANOCOBALAMIN) 1000 MCG tablet Take 1,000 mcg by mouth daily.    No facility-administered encounter medications on file as of 12/27/2021.    Care Gaps: Shingrix Vaccine  Star Rating Drugs: None ID   Medication Fill Gaps: Amlodipine  10 MG  last filled 11/01/2021 30 day supply Buspirone 10 MG last filled 11/01/2021 30 day supply Citalopram 40 MG last filled 11/01/2021 30 day supply Hydrochlorothiazide 12.5 MG last filled 11/01/2021 30 day supply  [place] Pharmacist Assistant [phone]

## 2021-12-28 ENCOUNTER — Ambulatory Visit (INDEPENDENT_AMBULATORY_CARE_PROVIDER_SITE_OTHER): Payer: Medicare Other

## 2021-12-28 DIAGNOSIS — I10 Essential (primary) hypertension: Secondary | ICD-10-CM

## 2021-12-28 DIAGNOSIS — E782 Mixed hyperlipidemia: Secondary | ICD-10-CM

## 2021-12-28 DIAGNOSIS — F3341 Major depressive disorder, recurrent, in partial remission: Secondary | ICD-10-CM

## 2021-12-28 DIAGNOSIS — F411 Generalized anxiety disorder: Secondary | ICD-10-CM

## 2021-12-28 DIAGNOSIS — J4521 Mild intermittent asthma with (acute) exacerbation: Secondary | ICD-10-CM

## 2021-12-28 MED ORDER — AMLODIPINE BESYLATE 5 MG PO TABS: 5.0000 mg | ORAL_TABLET | Freq: Every day | ORAL | Status: DC

## 2021-12-28 MED ORDER — BREZTRI AEROSPHERE 160-9-4.8 MCG/ACT IN AERO: 2.0000 | INHALATION_SPRAY | Freq: Two times a day (BID) | RESPIRATORY_TRACT | 3 refills | Status: DC

## 2021-12-28 NOTE — Progress Notes (Signed)
Chronic Care Management Pharmacy Note  12/28/2021 Name:  Nancy Blackburn MRN:  [id number] DOB:  06-03-1946  Summary: Patient presents for initial CCM consult. She continues to have shortness of breath and fatigue since her hospitalization. -Patient continued on amlodipine 5 mg since hospitalization.  -Patient kidney function has not fully recovered since hospitalization. -Patient restarted rosuvastatin after hospitalization. Sedimentation rate decreased at PCP follow-up, but still elevated. No repeat CK.   Recommendations/Changes made from today's visit: -STOP rosuvastatin until PCP can recheck CK levels.   -DECREASE Amlodipine down to 5 mg daily as blood pressures controlled on this dose.  -Recommended decreasing citalopram to 20 mg daily due to risk of Qtc prolongation. Patient to discuss at psychiatry follow-up in March.  -Recommend rechecking BMP, fasting lipid panel, CK.  -If kidney function still in 3a range, STOP HCTZ, start lisinopril 10 mg daily.  -START PAP for Breztri  Plan: CPP follow-up 3 months   Recommended Problem List Changes:  Add: Essential Hypertension Osteoporosis  CKD Stage 3a  Insomnia  Subjective: Nancy Blackburn is an 76 y.o. year old female who is a primary patient of Jerrol Banana., MD.  The CCM team was consulted for assistance with disease management and care coordination needs.    Engaged with patient by telephone for initial visit in response to provider referral for pharmacy case management and/or care coordination services.   Consent to Services:  The patient was given the following information about Chronic Care Management services today, agreed to services, and gave verbal consent: 1. CCM service includes personalized support from designated clinical staff supervised by the primary care provider, including individualized plan of care and coordination with other care providers 2. 24/7 contact phone numbers for assistance for  urgent and routine care needs. 3. Service will only be billed when office clinical staff spend 20 minutes or more in a month to coordinate care. 4. Only one practitioner may furnish and bill the service in a calendar month. 5.The patient may stop CCM services at any time (effective at the end of the month) by phone call to the office staff. 6. The patient will be responsible for cost sharing (co-pay) of up to 20% of the service fee (after annual deductible is met). Patient agreed to services and consent obtained.  Patient Care Team: Jerrol Banana., MD as PCP - General (Unknown Physician Specialty) Kate Sable, MD as PCP - Cardiology (Cardiology) Marlyn Corporal Clearnce Sorrel, PA-C as Physician Assistant (Family Medicine) Birder Robson, MD as Referring Physician (Ophthalmology) Vladimir Crofts, MD as Consulting Physician (Neurology) Bjorn Loser, MD as Consulting Physician (Urology) Newman Pies, MD as Consulting Physician (Neurosurgery) Chauncey Mann, MD as Referring Physician (Psychiatry) Richmond Campbell, MD as Consulting Physician (Gastroenterology) Neldon Labella, RN as Case Manager Clent Jacks, RN as Oncology Nurse Navigator Germaine Pomfret, [facility] (Pharmacist)  Recent office visits: 11/10/2021 Dr. Rosanna Randy MD (PCP) No Medication Changes noted, AMB Referral to [facility], Return in about 3 months   07/29/2021 Dr. Rosanna Randy MD (PCP) start prednisone 20 mg for 5 days, start  doxycycline 100 mg for 5 days, methyiprednisololne acetate 40 mg injection given,   07/21/2021 Dr. Rosanna Randy MD (PCP) No Medication Changes noted   Recent consult visits: 12/21/2021 Dr. Patsey Berthold MD (Pulmonology) No Medication changes noted, Ambulatory referral to cardiology, follow-up in 3 months   12/01/2021 Dr. Chilton Si MD (Oncology) No Medication Changes noted  11/22/2021 Dr. Gilman Schmidt MD (OBGYN) No Medication Changes noted, Ambulatory referral to  Hematology/Oncology    11/16/2021 Dr. Patsey Berthold MD (Pulmonology) No Medication Changes noted, Follow up on 4-6 weeks.  10/08/2021 Dr. Sharol Harness MD (Cardiology) No Medication changes noted, Follow up in 6 months.  09/30/2021 Dr. Patsey Berthold MD (Pulmonology) Restart Judithann Sauger 160-9-4.8 MCG/ACT 2 puffs twice a day, Follow-up in 2 months time 08/24/2021 Dr. Patsey Berthold MD (Pulmonology) No Medication Changes noted, follow up in 4-6 weeks.  07/06/2021 Tammy Parrett NP (Pulmonary)  Change  fluticasone furoate-vilanterol  to Advair 100-50 MCG/ACT 2 times daily due to coverage.   07/02/2021 Tammy Parrett NP (Pulmonary) Start albuterol 108 MCG/ACT PRN, start fluticasone furoate-vilanterol 100-25 MCG/INH for one day, Follow up in 6-8 weeks.    Hospital visits: Admitted to the hospital on 10/18/2021 due to sepsis. Discharge date was 10/26/2021. Discharged from [place]?Medications Started at [facility] Discharge:?? -started Duricef 500 mg 2 tablets twice a day through 10/28/2021    Medication Changes at Hospital Discharge: -Changed amlodipine 10 mg daily   Medications Discontinued at Hospital Discharge: -Stopped rosuvastatin   Medications that remain the same after Hospital Discharge:??  -All other medications will remain the same.    Objective:  Lab Results  Component Value Date   CREATININE 1.01 (H) 12/14/2021   BUN 14 12/14/2021   GFRNONAA 43 (L) 10/26/2021   GFRAA 72 08/27/2020   NA 144 12/14/2021   K 3.7 12/14/2021   CALCIUM 9.5 12/14/2021   CO2 24 12/14/2021   GLUCOSE 123 (H) 12/14/2021    Lab Results  Component Value Date/Time   HGBA1C 5.4 02/26/2021 09:07 AM   HGBA1C 5.6 05/27/2016 10:15 AM    Last diabetic Eye exam: No results found for: HMDIABEYEEXA  Last diabetic Foot exam: No results found for: HMDIABFOOTEX   Lab Results  Component Value Date   CHOL 200 (H) 04/12/2019   HDL 59 04/12/2019   LDLCALC 110 (H) 04/12/2019   TRIG 154 (H) 04/12/2019   CHOLHDL 3.4  04/12/2019    Hepatic Function Latest Ref Rng & Units 12/14/2021 11/10/2021 10/23/2021  Total Protein 6.0 - 8.5 g/dL 6.9 6.8 4.7(L)  Albumin 3.7 - 4.7 g/dL 4.0 3.9 1.9(L)  AST 0 - 40 IU/L 14 13 53(H)  ALT 0 - 32 IU/L 8 6 71(H)  Alk Phosphatase 44 - 121 IU/L 125(H) 139(H) 163(H)  Total Bilirubin 0.0 - 1.2 mg/dL 0.3 0.3 0.4  Bilirubin, Direct 0.0 - 0.2 mg/dL - - -    Lab Results  Component Value Date/Time   TSH 1.620 04/14/2021 09:37 AM   TSH 1.330 08/27/2020 03:02 PM    CBC Latest Ref Rng & Units 11/10/2021 10/23/2021 10/20/2021  WBC 3.4 - 10.8 x10E3/uL 7.5 7.8 9.5  Hemoglobin 11.1 - 15.9 g/dL 12.4 10.8(L) 11.6(L)  Hematocrit 34.0 - 46.6 % 37.7 32.7(L) 34.2(L)  Platelets 150 - 450 x10E3/uL 430 180 98(L)    Lab Results  Component Value Date/Time   VD25OH 18.50 (L) 10/18/2021 12:56 PM    Clinical ASCVD: No  The 10-year ASCVD risk score (Arnett DK, et al., 2019) is: 27.3%   Values used to calculate the score:     Age: 51 years     Sex: Female     Is Non-Hispanic African American: No     Diabetic: No     Tobacco smoker: No     Systolic Blood Pressure: 474 mmHg     Is BP treated: Yes     HDL Cholesterol: 59 mg/dL     Total Cholesterol: 200  mg/dL    Depression screen [facility] 2/9 11/22/2021 02/16/2021 12/17/2019  Decreased Interest 0 0 0  Down, Depressed, Hopeless 1 0 0  PHQ - 2 Score 1 0 0  Altered sleeping 0 0 3  Tired, decreased energy 1 0 1  Change in appetite 0 0 0  Feeling bad or failure about yourself  0 0 0  Trouble concentrating 0 0 0  Moving slowly or fidgety/restless 0 0 0  Suicidal thoughts 0 0 0  PHQ-9 Score 2 0 4  Difficult doing work/chores Not difficult at all Not difficult at all Somewhat difficult    Social History   Tobacco Use  Smoking Status Never  Smokeless Tobacco Never   BP Readings from Last 3 Encounters:  12/21/21 140/89  12/01/21 (!) 160/68  11/22/21 124/71   Pulse Readings from Last 3 Encounters:  12/21/21 89  12/01/21 96  11/16/21 86    Wt Readings from Last 3 Encounters:  12/21/21 171 lb (77.6 kg)  12/01/21 175 lb 6.4 oz (79.6 kg)  11/22/21 174 lb 9.6 oz (79.2 kg)   BMI Readings from Last 3 Encounters:  12/21/21 26.78 kg/m  12/01/21 27.47 kg/m  11/22/21 27.35 kg/m    Assessment/Interventions: Review of patient past medical history, allergies, medications, health status, including review of consultants reports, laboratory and other test data, was performed as part of comprehensive evaluation and provision of chronic care management services.   SDOH:  (Social Determinants of Health) assessments and interventions performed: Yes SDOH Interventions    Flowsheet Row Most Recent Value  SDOH Interventions   Financial Strain Interventions Other (Comment)  [PAP]      SDOH Screenings   Alcohol Screen: Low Risk    Last Alcohol Screening Score (AUDIT): 1  Depression (PHQ2-9): Low Risk    PHQ-2 Score: 2  Financial Resource Strain: High Risk   Difficulty of Paying Living Expenses: Hard  Food Insecurity: No Food Insecurity   Worried About Charity fundraiser in the Last Year: Never true   Ran Out of Food in the Last Year: Never true  Housing: Not on file  Physical Activity: Not on file  Social Connections: Not on file  Stress: Not on file  Tobacco Use: Low Risk    Smoking Tobacco Use: Never   Smokeless Tobacco Use: Never   Passive Exposure: Not on file  Transportation Needs: No Transportation Needs   Lack of Transportation (Medical): No   Lack of Transportation (Non-Medical): No    CCM Care Plan  Allergies  Allergen Reactions   Anti-Inflammatory Enzyme [Nutritional Supplements] Other (See Comments)    REACTION: Due to bleeding ulcer   Cymbalta [Duloxetine Hcl] Other (See Comments)    REACTION: Urinary retention   Clarithromycin Other (See Comments)    Upsets stomach   Donepezil Hcl Other (See Comments)    Significant bradycardia causing syncope   Nsaids     GI BLEED   Ranitidine     severe  diarrhea   Venlafaxine     severe depression    Medications Reviewed Today     Reviewed by Germaine Pomfret, [facility] (Pharmacist) on 12/28/21 at 1013  Med List Status: <None>   Medication Order Taking? Sig Documenting Provider Last Dose Status Informant  acetaminophen (TYLENOL) 325 MG tablet [id number]  Take 1-2 tablets (325-650 mg total) by mouth every 6 (six) hours as needed for mild pain (pain score 1-3 or temp > 100.5). Thornton Park, MD  Active Self  acetaminophen (TYLENOL) 500  MG tablet [id number] Yes Take 500 mg by mouth every 6 (six) hours as needed. [provider]  Active   albuterol (VENTOLIN HFA) 108 (90 Base) MCG/ACT inhaler [id number] Yes Inhale 1-2 puffs into the lungs every 6 (six) hours as needed for wheezing or shortness of breath. Parrett, Fonnie Mu, NP Taking Active Self  amLODipine (NORVASC) 10 MG tablet [id number] Yes Take 1 tablet (10 mg total) by mouth daily. Loletha Grayer, MD Taking Active   Budeson-Glycopyrrol-Formoterol (BREZTRI AEROSPHERE) 160-9-4.8 MCG/ACT Hollie Salk [id number] Yes Inhale 160 mcg into the lungs in the morning and at bedtime. Tyler Pita, MD Taking Active Self  busPIRone (BUSPAR) 10 MG tablet [id number] Yes Take 1 tablet (10 mg total) by mouth 2 (two) times daily. Loletha Grayer, MD Taking Active   Calcium Carb-Cholecalciferol (CALCIUM 600+D3) 600-800 MG-UNIT TABS [id number] Yes Take 1 tablet by mouth daily. [provider] Taking Active Self  cholecalciferol (VITAMIN D) 25 MCG tablet [id number] Yes Take 1 tablet (1,000 Units total) by mouth daily. Loletha Grayer, MD Taking Active   citalopram (CELEXA) 40 MG tablet [id number] Yes Take 40 mg by mouth daily. [provider] Taking Active Self           Med Note (NEWCOMER Graylin Shiver   Mon Aug 31, 2020  4:02 PM)    fexofenadine (ALLEGRA) 180 MG tablet [id number] Yes Take 180 mg by mouth at bedtime. [provider] Taking Active Self  hydrochlorothiazide  (HYDRODIURIL) 12.5 MG tablet [id number] Yes Take 1 tablet (12.5 mg total) by mouth every morning. Jerrol Banana., MD Taking Active   LORazepam (ATIVAN) 0.5 MG tablet [id number] Yes TAKE 1 TABLET BY MOUTH 3 TIMES DAILY AS NEEDED FOR ANXIETY Myles Gip, DO Taking Active   primidone (MYSOLINE) 50 MG tablet [id number] Yes Take 50 mg by mouth at bedtime. [provider]  Active   traZODone (DESYREL) 100 MG tablet [id number] Yes Take 300 mg by mouth at bedtime. [provider] Taking Active Self           Med Note Michaelle Birks, Cathe Mons A   Tue Dec 28, 2021 10:01 AM)    vitamin B-12 (CYANOCOBALAMIN) 1000 MCG tablet [id number] Yes Take 1,000 mcg by mouth daily. [provider] Taking Active Self            Patient Active Problem List   Diagnosis Date Noted   Cyst of left ovary    Acute pain of right knee    Elevated LFTs    Acute metabolic encephalopathy    Sepsis due to Proteus species ([place]) 10/18/2021   Rhabdomyolysis 10/18/2021   Acute kidney injury ([place]) 10/18/2021   Leukocytosis 10/18/2021   Asthma 07/02/2021   S/P TKR (total knee replacement) using cement, right 03/09/2021   Bradycardia 07/03/2020   Postural dizziness with presyncope 07/01/2020   Family history of colon cancer in mother 02/22/2018   Cervical spondylosis 05/04/2017   Cervical radiculopathy 05/04/2017   Family history of Cushing disease 01/12/2017   B12 deficiency 12/18/2016   Mild cognitive impairment with memory loss 12/18/2016   Parkinsonian features 12/18/2016   Vitamin D deficiency 12/18/2016   Chalazion, bilateral 06/08/2016   Absolute anemia 07/30/2015   Edema extremities 07/30/2015   Benign essential tremor 07/30/2015   Anxiety, generalized 07/30/2015   Gastro-esophageal reflux disease without esophagitis 07/30/2015   HLD (hyperlipidemia) 07/30/2015   Cannot sleep 07/30/2015   Lumbar radiculopathy 07/30/2015   Depression, major, recurrent, in partial remission ([place])  07/30/2015  Arthritis, degenerative 07/30/2015   Allergic rhinitis 07/30/2015   Gastroduodenal ulcer 07/30/2015   Calcium blood increased 07/30/2015   Pre-syncope 05/17/2015   Dyspnea 03/31/2015   Bleeding stomach ulcer 10/14/2013   Anemia, iron deficiency 10/14/2013   Weakness 10/14/2013   Ejection fraction    Pericardial effusion    Palpitations    Low back pain    Normal nuclear stress test     Immunization History  Administered Date(s) Administered   Fluad Quad(high Dose 65+) 10/21/2019, 10/14/2020, 08/25/2021   Influenza Inj Mdck Quad Pf 10/27/2016   Influenza Split 10/05/2011, 10/10/2012   Influenza, High Dose Seasonal PF 09/04/2014, 09/01/2015, 10/05/2017, 10/10/2018   Influenza-Unspecified 10/12/2013, 08/12/2014, 09/04/2014   PFIZER(Purple Top)SARS-COV-2 Vaccination 02/06/2020, 03/03/2020, 11/11/2020   Pneumococcal Conjugate-13 03/26/2015   Pneumococcal Polysaccharide-23 10/11/1999, 04/10/2012   Td 08/23/1999   Tdap 05/20/2019   Tetanus 05/28/2019   Zoster, Live 09/26/2008    Conditions to be addressed/monitored:  Hypertension, Hyperlipidemia, GERD, Asthma, Chronic Kidney Disease, Anxiety, and Osteoporosis  Care Plan : General Pharmacy (Adult)  Updates made by Germaine Pomfret, RPH since 12/28/2021 12:00 AM     Problem: Hypertension, Hyperlipidemia, GERD, Asthma, Chronic Kidney Disease, Anxiety, and Osteoporosis   Priority: High     Long-Range Goal: Patient-Specific Goal   Start Date: 12/28/2021  Expected End Date: 12/28/2022  This Visit's Progress: On track  Priority: High  Note:   Current Barriers:  Unable to independently afford treatment regimen Unable to achieve control of cholesterol  Suboptimal therapeutic regimen for HTN, Osteoporosis, depression  Pharmacist Clinical Goal(s):  Patient will verbalize ability to afford treatment regimen achieve control of hypertension as evidenced by BP less than 140/90 adhere to plan to optimize therapeutic  regimen for HLD, osteoporosis as evidenced by report of adherence to recommended medication management changes through collaboration with PharmD and provider.   Interventions: 1:1 collaboration with Jerrol Banana., MD regarding development and update of comprehensive plan of care as evidenced by provider attestation and co-signature Inter-disciplinary care team collaboration (see longitudinal plan of care) Comprehensive medication review performed; medication list updated in electronic medical record  Hypertension (BP goal <140/90) -Not ideally controlled -Current treatment: Amlodipine 5 mg daily (Started Apr 2022): Query Appropriate HCTZ 12.5 mg daily (Started Nov 2022): : Query Appropriate -Medications previously tried: Metoprolol (bradycardia)   -Current home readings: Has not been monitoring at home due to needing new batteries. -Denies hypotensive/hypertensive symptoms -Patient continued on amlodipine 5 mg since hospitalization.  -Patient kidney function has not fully recovered since hospitalization. If kidney function remains in CKD stage 3a, patient would be candidate for ACEi or ARB to prevent worsening kidney disease.  -DECREASE Amlodipine down to 5 mg daily as blood pressures controlled on this dose.  -Recommend rechecking BMP. If kidney function still in 3a range, STOP HCTZ, start lisinopril 10 mg daily.   Hyperlipidemia: (LDL goal < 100) -Uncontrolled -Current treatment: None -Medications previously tried: Rosuvastatin -Patient with rhabdomyolysis (CPK peak 15,867) during hospitalization in Nov 2022. Patient restarted rosuvastatin after hospitalization. Sedimentation rate decreased at PCP follow-up, but still elevated. No repeat CK.  -STOP rosuvastatin until PCP can recheck CK levels.   Asthma (Goal: control symptoms and prevent exacerbations) -Controlled -Current treatment  Ventolin HFA 1-2 puffs every 6 hours as needed: Appropriate, Effective, Safe,  Accessible Breztri 1 puff twice daily: Appropriate, Effective, Safe, Query accessible -Current allergy treatment  Fexofenadine 180 mg nightly: Appropriate, Effective, Safe, Accessible -Medications previously tried: NA  -Currently has 3 inhalers supply.  -  Increased shortness of breath with exertion (grocery store),  -Pulmonary function testing: FEV1/FVC 81% (2017) -Exacerbations requiring treatment in last 6 months: None -Patient reports consistent use of maintenance inhaler -Frequency of rescue inhaler use: Used once in past week, but sometimes  -Counseled on Proper inhaler technique; -Recommended to continue current medication -START PAP for Breztri  Anxiety (Goal: Minimize anxiety symtoms) -Controlled -managed by Dr. Nicolasa Ducking  -Current treatment: Buspirone 10 mg twice daily: Appropriate, Effective, Safe, Accessible Citalopram 40 mg daily: Appropriate, Effective, Query Safe Lorazepam 0.5 mg three times daily as needed: Appropriate, Effective, Safe, Accessible -Medications previously tried/failed: Cymbalta, Effexor -More anxiety related to daughter's hospitalization and now using lorazepam daily  -Recommended decreasing citalopram to 20 mg daily due to risk of Qtc prolongation. Patient to discuss at psychiatry follow-up in March.   Insomnia (Goal: Improve sleep quality) -Uncontrolled -Current treatment  Trazodone 100 mg 3 tablets nightly: Appropriate, Query effective  -Medications previously tried: Melatonin -Difficulty falling asleep. In bed by 9:30 PM, won't fall asleep until 2-3 am. Wakes up 3 times nightly to use restroom. Normally wakes up around 11 am.   -Counseled on proper sleep hygiene.  -Recommended to continue current medication  Osteoporosis (Goal Prevent bone fractures) -Not ideally controlled -History of fracture of sacrum/coccyx -Last DEXA Scan: 02/07/18   T-Score femoral neck: NA  T-Score total hip: -2.4  T-Score lumbar spine: NA  T-Score forearm radius:  -1.4 -Patient is a candidate for pharmacologic treatment due to T-Score < -2.5 in lumbar spine -Current treatment  Calcium 600 mg daily: Appropriate, Effective, Safe, Accessible Vitamin D 1000 units daily: Appropriate, Query effective  -Medications previously tried: NA  -Patient likely candidate for treatment given fracture history. Did not discuss with patient today.  -Recommend rechecking DEXA.  -Recommend starting ergocalciferol 50,000 units weekly  Patient Goals/Self-Care Activities Patient will:  - check blood pressure 2-3 times weekly, document, and provide at future appointments  Follow Up Plan: Telephone follow up appointment with care management team member scheduled for:  03/28/2022 at 3:00 PM      Medication Assistance: Application for Breztri  medication assistance program. in process.  Anticipated assistance start date TBD.  See plan of care for additional detail.  Compliance/Adherence/Medication fill history: Care Gaps: Shingrix  Star-Rating Drugs: None ID  Patient's preferred pharmacy is:  [facility], [address] [place] [address] Phone: [phone] Fax: [phone]  [facility] - [place], [address] [place] [address] Phone: [phone] Fax: [phone]  Uses pill box? Yes Pt endorses 100% compliance  We discussed: Current pharmacy is preferred with insurance plan and patient is satisfied with pharmacy services Patient decided to: Continue current medication management strategy  Care Plan and Follow Up Patient Decision:  Patient agrees to Care Plan and Follow-up.  Plan: Telephone follow up appointment with care management team member scheduled for:  03/28/2022 at 3:00 PM  Junius Argyle, PharmD, [place], [place] Pharmacist Practitioner  [facility] [phone]

## 2021-12-28 NOTE — Patient Instructions (Addendum)
Visit Information It was great speaking with you today!  Please let me know if you have any questions about our visit.   Goals Addressed             This Visit's Progress    Track and Manage My Blood Pressure-Hypertension       Timeframe:  Long-Range Goal Priority:  High Start Date: 12/28/2021                            Expected End Date: 12/28/2022                      Follow Up within 90 days   - check blood pressure 3 times per week    Why is this important?   You won't feel high blood pressure, but it can still hurt your blood vessels.  High blood pressure can cause heart or kidney problems. It can also cause a stroke.  Making lifestyle changes like losing a little weight or eating less salt will help.  Checking your blood pressure at home and at different times of the day can help to control blood pressure.  If the doctor prescribes medicine remember to take it the way the doctor ordered.  Call the office if you cannot afford the medicine or if there are questions about it.     Notes:        Patient Care Plan: General Pharmacy (Adult)     Problem Identified: Hypertension, Hyperlipidemia, GERD, Asthma, Chronic Kidney Disease, Anxiety, and Osteoporosis   Priority: High     Long-Range Goal: Patient-Specific Goal   Start Date: 12/28/2021  Expected End Date: 12/28/2022  This Visit's Progress: On track  Priority: High  Note:   Current Barriers:  Unable to independently afford treatment regimen Unable to achieve control of cholesterol  Suboptimal therapeutic regimen for HTN, Osteoporosis, depression  Pharmacist Clinical Goal(s):  Patient will verbalize ability to afford treatment regimen achieve control of hypertension as evidenced by BP less than 140/90 adhere to plan to optimize therapeutic regimen for HLD, osteoporosis as evidenced by report of adherence to recommended medication management changes through collaboration with PharmD and provider.    Interventions: 1:1 collaboration with Jerrol Banana., MD regarding development and update of comprehensive plan of care as evidenced by provider attestation and co-signature Inter-disciplinary care team collaboration (see longitudinal plan of care) Comprehensive medication review performed; medication list updated in electronic medical record  Hypertension (BP goal <140/90) -Not ideally controlled -Current treatment: Amlodipine 5 mg daily (Started Apr 2022): Query Appropriate HCTZ 12.5 mg daily (Started Nov 2022): : Query Appropriate -Medications previously tried: Metoprolol (bradycardia)   -Current home readings: Has not been monitoring at home due to needing new batteries. -Denies hypotensive/hypertensive symptoms -Patient continued on amlodipine 5 mg since hospitalization.  -Patient kidney function has not fully recovered since hospitalization. If kidney function remains in CKD stage 3a, patient would be candidate for ACEi or ARB to prevent worsening kidney disease.  -DECREASE Amlodipine down to 5 mg daily as blood pressures controlled on this dose.  -Recommend rechecking BMP. If kidney function still in 3a range, STOP HCTZ, start lisinopril 10 mg daily.   Hyperlipidemia: (LDL goal < 100) -Uncontrolled -Current treatment: None -Medications previously tried: Rosuvastatin -Patient with rhabdomyolysis (CPK peak 15,867) during hospitalization in Nov 2022. Patient restarted rosuvastatin after hospitalization. Sedimentation rate decreased at PCP follow-up, but still elevated. No repeat CK.  -STOP  rosuvastatin until PCP can recheck CK levels.   Asthma (Goal: control symptoms and prevent exacerbations) -Controlled -Current treatment  Ventolin HFA 1-2 puffs every 6 hours as needed: Appropriate, Effective, Safe, Accessible Breztri 1 puff twice daily: Appropriate, Effective, Safe, Query accessible -Current allergy treatment  Fexofenadine 180 mg nightly: Appropriate, Effective,  Safe, Accessible -Medications previously tried: NA  -Currently has 3 inhalers supply.  -Increased shortness of breath with exertion (grocery store),  -Pulmonary function testing: FEV1/FVC 81% (2017) -Exacerbations requiring treatment in last 6 months: None -Patient reports consistent use of maintenance inhaler -Frequency of rescue inhaler use: Used once in past week, but sometimes  -Counseled on Proper inhaler technique; -Recommended to continue current medication -START PAP for Breztri  Anxiety (Goal: Minimize anxiety symtoms) -Controlled -managed by Dr. Nicolasa Ducking  -Current treatment: Buspirone 10 mg twice daily: Appropriate, Effective, Safe, Accessible Citalopram 40 mg daily: Appropriate, Effective, Query Safe Lorazepam 0.5 mg three times daily as needed: Appropriate, Effective, Safe, Accessible -Medications previously tried/failed: Cymbalta, Effexor -More anxiety related to daughter's hospitalization and now using lorazepam daily  -Recommended decreasing citalopram to 20 mg daily due to risk of Qtc prolongation. Patient to discuss at psychiatry follow-up in March.   Insomnia (Goal: Improve sleep quality) -Uncontrolled -Current treatment  Trazodone 100 mg 3 tablets nightly: Appropriate, Query effective  -Medications previously tried: Melatonin -Difficulty falling asleep. In bed by 9:30 PM, won't fall asleep until 2-3 am. Wakes up 3 times nightly to use restroom. Normally wakes up around 11 am.   -Counseled on proper sleep hygiene.  -Recommended to continue current medication  Osteoporosis (Goal Prevent bone fractures) -Not ideally controlled -History of fracture of sacrum/coccyx -Last DEXA Scan: 02/07/18   T-Score femoral neck: NA  T-Score total hip: -2.4  T-Score lumbar spine: NA  T-Score forearm radius: -1.4 -Patient is a candidate for pharmacologic treatment due to T-Score < -2.5 in lumbar spine -Current treatment  Calcium 600 mg daily: Appropriate, Effective, Safe,  Accessible Vitamin D 1000 units daily: Appropriate, Query effective  -Medications previously tried: NA  -Patient likely candidate for treatment given fracture history. Did not discuss with patient today.  -Recommend rechecking DEXA.  -Recommend starting ergocalciferol 50,000 units weekly  Patient Goals/Self-Care Activities Patient will:  - check blood pressure 2-3 times weekly, document, and provide at future appointments  Follow Up Plan: Telephone follow up appointment with care management team member scheduled for:  03/28/2022 at 3:00 PM     Ms. Bearman was given information about Chronic Care Management services today including:  CCM service includes personalized support from designated clinical staff supervised by her physician, including individualized plan of care and coordination with other care providers 24/7 contact phone numbers for assistance for urgent and routine care needs. Standard insurance, coinsurance, copays and deductibles apply for chronic care management only during months in which we provide at least 20 minutes of these services. Most insurances cover these services at 100%, however patients may be responsible for any copay, coinsurance and/or deductible if applicable. This service may help you avoid the need for more expensive face-to-face services. Only one practitioner may furnish and bill the service in a calendar month. The patient may stop CCM services at any time (effective at the end of the month) by phone call to the office staff.  Patient agreed to services and verbal consent obtained.   Patient verbalizes understanding of instructions and care plan provided today and agrees to view in [place]. Active MyChart status confirmed with patient.    Alex  Michaelle Birks, PharmD, Para [place], [place]  Clinical Pharmacist Practitioner  [facility] [phone]

## 2021-12-29 ENCOUNTER — Ambulatory Visit: Payer: Medicare Other | Admitting: Obstetrics and Gynecology

## 2021-12-31 ENCOUNTER — Other Ambulatory Visit: Payer: Self-pay

## 2021-12-31 ENCOUNTER — Ambulatory Visit (INDEPENDENT_AMBULATORY_CARE_PROVIDER_SITE_OTHER): Payer: Medicare Other | Admitting: Obstetrics and Gynecology

## 2021-12-31 ENCOUNTER — Encounter: Payer: Self-pay | Admitting: Obstetrics and Gynecology

## 2021-12-31 VITALS — BP 120/70 | Ht 67.0 in | Wt 168.2 lb

## 2021-12-31 DIAGNOSIS — N83202 Unspecified ovarian cyst, left side: Secondary | ICD-10-CM | POA: Diagnosis not present

## 2021-12-31 NOTE — Progress Notes (Signed)
Patient ID: Nancy Blackburn, female   DOB: 1946-07-27, 76 y.o.   MRN: [id number]  Reason for Consult: Gynecologic Exam   Referred by Jerrol Banana.,*  Subjective:     HPI:  Nancy Blackburn is a 76 y.o. female. She is here today for follow-up after consultation with Dr. Fransisca Connors on 12/01/2021.  She reports today that she is having shortness of breath which is new in onset.  She reports that she sees a pulmonologist.  She took an inhaler today but has not had improvement.  She had some shortness of breath while she is hospitalized as well.  And they decided that it was largely secondary to anxiety.  Patient reports that she has been stressed with taking care of her daughter who is ill at her house.  Her daughter went through a gastric bypass surgery but now has ulcers in her stomach.  She reports that the surgeons want to revise and reverse her daughter surgery but her daughter is too sick for that at this time.  She has a lorazepam with her and is considering taking this medication.  Dr. Jarold Song had recommended monitoring with follow-up EGD 4 and ultrasound in 3 months.  However the patient is interested in having her ovarian cyst removed since she is having secondary issues such as weight loss.  Gynecological History  No LMP recorded. Patient has had a hysterectomy.  Past Medical History:  Diagnosis Date   Arthritis    Asthma    Bradycardia    Cervical cancer ([place]) 1978   Chronic sinus infection    Ejection fraction    a. EF 55-65%, echo, mild LVH, June, 2013, small pericardial effusion   Fracture, sacrum/coccyx ([place])    History of kidney stones    Hyperlipidemia    a. takes Simvastatin   Hypertension    a. takes Metoprolol, Lisinopril, and amlodipine.   Low back pain    Mild dementia    Nasal polyps    Normal nuclear stress test    a. 07/2005 Nl nuc stress test.   Osteopenia    Osteoporosis    Palpitations    a. June 2013:  Event recorder normal sinus  rhythm with rare PVCs - ? symptomatic PVC's.   Parkinson disease ([place])    Pericardial effusion    a. Small, echo, June, 2013   PONV (postoperative nausea and vomiting)    Family History  Problem Relation Age of Onset   Colon cancer Mother    Heart murmur Mother    Heart disease Mother    Angina Mother    Heart disease Father    COPD Father    Atrial fibrillation Brother    Cushing syndrome Daughter    Cancer Daughter        thyroid   Breast cancer Cousin    Hypotension Neg Hx    Malignant hyperthermia Neg Hx    Pseudochol deficiency Neg Hx    Past Surgical History:  Procedure Laterality Date   BACK SURGERY  08/2010   BLADDER SUSPENSION  2003   bone spur  2009   removed from right collar bone area and partial collar bone removed   CATARACT EXTRACTION W/ INTRAOCULAR LENS  IMPLANT, BILATERAL     EYE SURGERY     LUMBAR FUSION  2011, 2012   SHOULDER ARTHROSCOPY Right 2012   THYROIDECTOMY, PARTIAL  1976   TONSILLECTOMY     as child   TOTAL KNEE ARTHROPLASTY Right 03/09/2021  Procedure: Right TOTAL KNEE ARTHROPLASTY;  Surgeon: Thornton Park, MD;  Location: [facility] ORS;  Service: Orthopedics;  Laterality: Right;   TUBAL LIGATION  1977   VAGINAL HYSTERECTOMY  1978   d/t cervical cancer    Short Social History:  Social History   Tobacco Use   Smoking status: Never   Smokeless tobacco: Never  Substance Use Topics   Alcohol use: Yes    Alcohol/week: 0.0 standard drinks    Comment: rarely    Allergies  Allergen Reactions   Anti-Inflammatory Enzyme [Nutritional Supplements] Other (See Comments)    REACTION: Due to bleeding ulcer   Cymbalta [Duloxetine Hcl] Other (See Comments)    REACTION: Urinary retention   Clarithromycin Other (See Comments)    Upsets stomach   Donepezil Hcl Other (See Comments)    Significant bradycardia causing syncope   Nsaids     GI BLEED   Ranitidine     severe diarrhea   Venlafaxine     severe depression    Current Outpatient  Medications  Medication Sig Dispense Refill   acetaminophen (TYLENOL) 500 MG tablet Take 500 mg by mouth every 6 (six) hours as needed.     albuterol (VENTOLIN HFA) 108 (90 Base) MCG/ACT inhaler Inhale 1-2 puffs into the lungs every 6 (six) hours as needed for wheezing or shortness of breath. 8 g 2   amLODipine (NORVASC) 5 MG tablet Take 1 tablet (5 mg total) by mouth daily.     Budeson-Glycopyrrol-Formoterol (BREZTRI AEROSPHERE) 160-9-4.8 MCG/ACT AERO Inhale 2 puffs into the lungs in the morning and at bedtime. Through [place] Patient Assistance 32.1 g 3   busPIRone (BUSPAR) 10 MG tablet Take 1 tablet (10 mg total) by mouth 2 (two) times daily. 60 tablet 0   Calcium Carb-Cholecalciferol (CALCIUM 600+D3) 600-800 MG-UNIT TABS Take 1 tablet by mouth daily.     cholecalciferol (VITAMIN D) 25 MCG tablet Take 1 tablet (1,000 Units total) by mouth daily. 30 tablet 0   citalopram (CELEXA) 40 MG tablet Take 40 mg by mouth daily.     fexofenadine (ALLEGRA) 180 MG tablet Take 180 mg by mouth at bedtime.     hydrochlorothiazide (HYDRODIURIL) 12.5 MG tablet Take 1 tablet (12.5 mg total) by mouth every morning. 90 tablet 1   LORazepam (ATIVAN) 0.5 MG tablet TAKE 1 TABLET BY MOUTH 3 TIMES DAILY AS NEEDED FOR ANXIETY 90 tablet 0   primidone (MYSOLINE) 50 MG tablet Take 50 mg by mouth at bedtime.     traZODone (DESYREL) 100 MG tablet Take 300 mg by mouth at bedtime.     vitamin B-12 (CYANOCOBALAMIN) 1000 MCG tablet Take 1,000 mcg by mouth daily.     No current facility-administered medications for this visit.    Review of Systems  Constitutional: Negative for chills, fatigue, fever and unexpected weight change.  HENT: Negative for trouble swallowing.  Eyes: Negative for loss of vision.  Respiratory: Negative for cough, shortness of breath and wheezing.  Cardiovascular: Positive for dyspnea with exertion. Negative for chest pain, leg swelling, palpitations and syncope.  GI: Negative for abdominal pain, blood  in stool, diarrhea, nausea and vomiting.  GU: Negative for difficulty urinating, dysuria, frequency and hematuria.  Musculoskeletal: Negative for back pain, leg pain and joint pain.  Skin: Negative for rash.  Neurological: Negative for dizziness, headaches, light-headedness, numbness and seizures.  Psychiatric: Negative for behavioral problem, confusion, depressed mood and sleep disturbance.       Objective:  Objective   Vitals:  12/31/21 1113  BP: 120/70  Weight: 168 lb 3.2 oz (76.3 kg)  Height: 5\' 7"  (1.702 m)   Body mass index is 26.34 kg/m.  Oxygen saturation 96% on room air Heart rate 77 bpm Respiratory rate 40 breaths per min  Physical Exam Vitals and nursing note reviewed. Exam conducted with a chaperone present.  Constitutional:      Appearance: Normal appearance.  HENT:     Head: Normocephalic and atraumatic.  Eyes:     Extraocular Movements: Extraocular movements intact.     Pupils: Pupils are equal, round, and reactive to light.  Cardiovascular:     Rate and Rhythm: Normal rate and regular rhythm.  Pulmonary:     Effort: Respiratory distress present.     Breath sounds: Normal breath sounds.  Abdominal:     General: Abdomen is flat.     Palpations: Abdomen is soft.  Musculoskeletal:     Cervical back: Normal range of motion.  Skin:    General: Skin is warm and dry.  Neurological:     General: No focal deficit present.     Mental Status: She is alert and oriented to person, place, and time.  Psychiatric:        Behavior: Behavior normal.        Thought Content: Thought content normal.        Judgment: Judgment normal.    Assessment/Plan:     76 yo with left ovarian cyst Discussed options for follow-up versus surgical management Will plan follow up US for next month Given her acute symptoms of shortness of breath I encouraged the patient to seek evaluation in the ER for possible pulmonary embolism.  Patient reported she would like to take her  anxiety medication first and if this did not improve her shortness of breath she would consider ER evaluation. She is interested in surgical management, but would need surgical clearance. Social factors seem to be a concern as well. Will plan phone follow up with the patient next week.   More than 20 minutes were spent face to face with the patient in the room, reviewing the medical record, labs and images, and coordinating care for the patient. The plan of management was discussed in detail and counseling was provided.    Adrian Prows MD [facility] 12/31/2021 5:06 PM

## 2022-01-03 ENCOUNTER — Telehealth: Payer: Self-pay

## 2022-01-03 NOTE — Progress Notes (Signed)
Chronic Care Management Pharmacy Assistant   Name: KARLEE STAFF  MRN: [id number] DOB: 1946-04-03  Reason for Encounter: Medication Review/Patient assistance application for [employer].   Recent office visits:  No recent office visit  Recent consult visits:  01/20/203 Dr. Gilman Schmidt MD (OBGYN) No Medication Changes noted.  Hospital visits:  None in previous 6 months  Medications: Outpatient Encounter Medications as of 01/03/2022  Medication Sig   acetaminophen (TYLENOL) 500 MG tablet Take 500 mg by mouth every 6 (six) hours as needed.   albuterol (VENTOLIN HFA) 108 (90 Base) MCG/ACT inhaler Inhale 1-2 puffs into the lungs every 6 (six) hours as needed for wheezing or shortness of breath.   amLODipine (NORVASC) 5 MG tablet Take 1 tablet (5 mg total) by mouth daily.   Budeson-Glycopyrrol-Formoterol (BREZTRI AEROSPHERE) 160-9-4.8 MCG/ACT AERO Inhale 2 puffs into the lungs in the morning and at bedtime. Through [place]&[employer] Patient Assistance   busPIRone (BUSPAR) 10 MG tablet Take 1 tablet (10 mg total) by mouth 2 (two) times daily.   Calcium Carb-Cholecalciferol (CALCIUM 600+D3) 600-800 MG-UNIT TABS Take 1 tablet by mouth daily.   cholecalciferol (VITAMIN D) 25 MCG tablet Take 1 tablet (1,000 Units total) by mouth daily.   citalopram (CELEXA) 40 MG tablet Take 40 mg by mouth daily.   fexofenadine (ALLEGRA) 180 MG tablet Take 180 mg by mouth at bedtime.   hydrochlorothiazide (HYDRODIURIL) 12.5 MG tablet Take 1 tablet (12.5 mg total) by mouth every morning.   LORazepam (ATIVAN) 0.5 MG tablet TAKE 1 TABLET BY MOUTH 3 TIMES DAILY AS NEEDED FOR ANXIETY   primidone (MYSOLINE) 50 MG tablet Take 50 mg by mouth at bedtime.   traZODone (DESYREL) 100 MG tablet Take 300 mg by mouth at bedtime.   vitamin B-12 (CYANOCOBALAMIN) 1000 MCG tablet Take 1,000 mcg by mouth daily.   No facility-administered encounter medications on file as of 01/03/2022.   Care Gaps: Shingrix Vaccine   Star Rating  Drugs: None ID    Medication Fill Gaps: Amlodipine  10 MG last filled 11/01/2021 30 day supply Buspirone 10 MG last filled 11/01/2021 30 day supply Citalopram 40 MG last filled 11/01/2021 30 day supply Hydrochlorothiazide 12.5 MG last filled 11/01/2021 30 day supply  01/04/2022 I received a task from Junius Argyle, CPP requesting that I start the application for patient assistance on the medication Breztri inhaler.    Patient gave consent to complete the patient assistance application online with me over the phone at [place] [employer] for [employer] inhaler. Per AZ[employer]ME Web site patient was denied. I will reach out to AZ[employer]ME on 01/05/2022 to confirmed the reason why.  01/05/2022:  I reach out to AZ&ME to confirm the reason why the patient was denied.Per AZ&ME, when entering patient medicare ID information online it will pulled patient income information through to help process the application.Per AZ&ME, patient was denied due to  her income is over the allow limit which is  above 300% of the Poverty level for 2023 (only can be at 300% or below to be eligable). Patient can appeal this by providing proof of her income, or she can provide documents showing her medical expense is 10% more then her household income.When patient fax over her proof of income she should provide this ID information to be able to attach to her application faster: [id number].Notified Clinical pharmacist and patient.  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  Patient Verbalized understanding.  [place] Pharmacist Assistant [phone]

## 2022-01-07 ENCOUNTER — Telehealth: Payer: Medicare Other

## 2022-01-11 ENCOUNTER — Telehealth: Payer: Self-pay

## 2022-01-11 DIAGNOSIS — F3341 Major depressive disorder, recurrent, in partial remission: Secondary | ICD-10-CM

## 2022-01-11 DIAGNOSIS — E782 Mixed hyperlipidemia: Secondary | ICD-10-CM

## 2022-01-11 DIAGNOSIS — I10 Essential (primary) hypertension: Secondary | ICD-10-CM | POA: Diagnosis not present

## 2022-01-11 DIAGNOSIS — J4521 Mild intermittent asthma with (acute) exacerbation: Secondary | ICD-10-CM

## 2022-01-11 NOTE — Telephone Encounter (Signed)
Please advise 

## 2022-01-11 NOTE — Telephone Encounter (Signed)
Patient sees Dr. Matilde Sprang, urologist, and he wants to have Candie's comprehensive metabolic panel repeated.  He wants PCP to order this. She is going to have a implant for urinary incontinence placed and needs this done .  Can you please place an order for this?

## 2022-01-12 ENCOUNTER — Ambulatory Visit (INDEPENDENT_AMBULATORY_CARE_PROVIDER_SITE_OTHER): Payer: Medicare Other | Admitting: Obstetrics and Gynecology

## 2022-01-12 ENCOUNTER — Encounter: Payer: Self-pay | Admitting: Obstetrics and Gynecology

## 2022-01-12 ENCOUNTER — Other Ambulatory Visit: Payer: Self-pay | Admitting: Family Medicine

## 2022-01-12 DIAGNOSIS — N83202 Unspecified ovarian cyst, left side: Secondary | ICD-10-CM

## 2022-01-12 DIAGNOSIS — K254 Chronic or unspecified gastric ulcer with hemorrhage: Secondary | ICD-10-CM

## 2022-01-12 DIAGNOSIS — A4159 Other Gram-negative sepsis: Secondary | ICD-10-CM

## 2022-01-12 NOTE — Telephone Encounter (Signed)
Dr. Rosanna Randy, can we get this ordered for Nancy Blackburn?

## 2022-01-12 NOTE — Progress Notes (Signed)
Virtual Visit via Telephone Note  I connected with Nancy Blackburn on 01/12/22 at  2:15 PM EST by telephone and verified that I am speaking with the correct person using two identifiers.   I discussed the limitations, risks, security and privacy concerns of performing an evaluation and management service by telephone and the availability of in person appointments. I also discussed with the patient that there may be a patient responsible charge related to this service. The patient expressed understanding and agreed to proceed.  The patient was at home I spoke with the patient from my  office The names of people involved in this encounter were: Dr. Gilman Schmidt and Loreta Ave  History of Present Illness: She is following up today to discuss her ovarian cyst.  She has a follow-up ultrasound planned for early next week.  She has a strong desire for surgery however it is not clear if she would be cleared by her cardiologist for the surgery.  She has been battling shortness of breath.  When she was here last week in the office she has shortness of breath but she reports that it resolved with using anxiolytic medicine at home.  Unfortunately her daughter is in the hospital again for pneumonia and her health has been poor.     Observations/Objective:   Physical Exam could not be performed. Because of the COVID-19 outbreak this visit was performed over the phone and not in person.   Assessment and Plan: 76 year old with left ovarian cyst Follow-up pelvic ultrasound is planned for next week. She has a strong desire to consider removal of this although her health with tolerating the surgery has been uncertain.  She is approaching visit with her cardiologist and will need to discuss cardiac clearance with them.  Follow Up Instructions: We will plan to talk on the phone after her pelvic ultrasound and she will let me know if her cardiac physician will give her clearance for surgery.   I  discussed the assessment and treatment plan with the patient. The patient was provided an opportunity to ask questions and all were answered. The patient agreed with the plan and demonstrated an understanding of the instructions.   The patient was advised to call back or seek an in-person evaluation if the symptoms worsen or if the condition fails to improve as anticipated.  I provided 10 minutes of non-face-to-face time during this encounter.  Adrian Prows MD [facility] 01/12/2022 3:16 PM

## 2022-01-13 ENCOUNTER — Ambulatory Visit (INDEPENDENT_AMBULATORY_CARE_PROVIDER_SITE_OTHER): Payer: Medicare Other

## 2022-01-13 DIAGNOSIS — Z0000 Encounter for general adult medical examination without abnormal findings: Secondary | ICD-10-CM | POA: Diagnosis not present

## 2022-01-13 NOTE — Progress Notes (Signed)
Virtual Visit via Telephone Note  I connected with  [name] on 01/13/22 at  2:20 PM EST by telephone and verified that I am speaking with the correct person using two identifiers.  Location: Patient: home Provider: [facility] Persons participating in the virtual visit: [place]   I discussed the limitations, risks, security and privacy concerns of performing an evaluation and management service by telephone and the availability of in person appointments. The patient expressed understanding and agreed to proceed.  Interactive audio and video telecommunications were attempted between this nurse and patient, however failed, due to patient having technical difficulties OR patient did not have access to video capability.  We continued and completed visit with audio only.  Some vital signs may be absent or patient reported.   Dionisio David, LPN  Subjective:   Nancy Blackburn is a 76 y.o. female who presents for Medicare Annual (Subsequent) preventive examination.  Review of Systems           Objective:    There were no vitals filed for this visit. There is no height or weight on file to calculate BMI.  Advanced Directives 12/01/2021 10/19/2021 04/04/2021 03/09/2021 02/26/2021 07/02/2020 07/01/2020  Does Patient Have a Medical Advance Directive? Yes Yes Yes Yes Yes - No  Type of Advance Directive Living will;Healthcare Power of [place];Living will Living will [place];Living will [place];Living will - -  Does patient want to make changes to medical advance directive? Yes (ED - Information included in AVS) No - Patient declined - No - Patient declined No - Patient declined - -  Copy of [place] in Chart? - No - copy requested - No - copy requested Yes - validated most recent copy scanned in chart (See row information) - -  Would patient like information on creating a  medical advance directive? - - No - Patient declined - - No - Patient declined -  Pre-existing out of facility DNR order (yellow form or pink MOST form) - - - - - - -    Current Medications (verified) Outpatient Encounter Medications as of 01/13/2022  Medication Sig   acetaminophen (TYLENOL) 500 MG tablet Take 500 mg by mouth every 6 (six) hours as needed.   albuterol (VENTOLIN HFA) 108 (90 Base) MCG/ACT inhaler Inhale 1-2 puffs into the lungs every 6 (six) hours as needed for wheezing or shortness of breath.   amLODipine (NORVASC) 5 MG tablet Take 1 tablet (5 mg total) by mouth daily.   Budeson-Glycopyrrol-Formoterol (BREZTRI AEROSPHERE) 160-9-4.8 MCG/ACT AERO Inhale 2 puffs into the lungs in the morning and at bedtime. Through [place] Patient Assistance   busPIRone (BUSPAR) 10 MG tablet Take 1 tablet (10 mg total) by mouth 2 (two) times daily.   Calcium Carb-Cholecalciferol (CALCIUM 600+D3) 600-800 MG-UNIT TABS Take 1 tablet by mouth daily.   cholecalciferol (VITAMIN D) 25 MCG tablet Take 1 tablet (1,000 Units total) by mouth daily.   citalopram (CELEXA) 40 MG tablet Take 40 mg by mouth daily.   fexofenadine (ALLEGRA) 180 MG tablet Take 180 mg by mouth at bedtime.   hydrochlorothiazide (HYDRODIURIL) 12.5 MG tablet Take 1 tablet (12.5 mg total) by mouth every morning.   LORazepam (ATIVAN) 0.5 MG tablet TAKE 1 TABLET BY MOUTH 3 TIMES DAILY AS NEEDED FOR ANXIETY   primidone (MYSOLINE) 50 MG tablet Take 50 mg by mouth at bedtime.   traZODone (DESYREL) 100 MG tablet Take 300  mg by mouth at bedtime.   vitamin B-12 (CYANOCOBALAMIN) 1000 MCG tablet Take 1,000 mcg by mouth daily.   No facility-administered encounter medications on file as of 01/13/2022.    Allergies (verified) Anti-inflammatory enzyme [nutritional supplements], Cymbalta [duloxetine hcl], Clarithromycin, Donepezil hcl, Nsaids, Ranitidine, and Venlafaxine   History: Past Medical History:  Diagnosis Date   Arthritis    Asthma     Bradycardia    Cervical cancer ([place]) 1978   Chronic sinus infection    Ejection fraction    a. EF 55-65%, echo, mild LVH, June, 2013, small pericardial effusion   Fracture, sacrum/coccyx ([place])    History of kidney stones    Hyperlipidemia    a. takes Simvastatin   Hypertension    a. takes Metoprolol, Lisinopril, and amlodipine.   Low back pain    Mild dementia    Nasal polyps    Normal nuclear stress test    a. 07/2005 Nl nuc stress test.   Osteopenia    Osteoporosis    Palpitations    a. June 2013:  Event recorder normal sinus rhythm with rare PVCs - ? symptomatic PVC's.   Parkinson disease ([place])    Pericardial effusion    a. Small, echo, June, 2013   PONV (postoperative nausea and vomiting)    Past Surgical History:  Procedure Laterality Date   BACK SURGERY  08/2010   BLADDER SUSPENSION  2003   bone spur  2009   removed from right collar bone area and partial collar bone removed   CATARACT EXTRACTION W/ INTRAOCULAR LENS  IMPLANT, BILATERAL     EYE SURGERY     LUMBAR FUSION  2011, 2012   SHOULDER ARTHROSCOPY Right 2012   THYROIDECTOMY, PARTIAL  1976   TONSILLECTOMY     as child   TOTAL KNEE ARTHROPLASTY Right 03/09/2021   Procedure: Right TOTAL KNEE ARTHROPLASTY;  Surgeon: Thornton Park, MD;  Location: [facility] ORS;  Service: Orthopedics;  Laterality: Right;   TUBAL LIGATION  1977   VAGINAL HYSTERECTOMY  1978   d/t cervical cancer   Family History  Problem Relation Age of Onset   Colon cancer Mother    Heart murmur Mother    Heart disease Mother    Angina Mother    Heart disease Father    COPD Father    Atrial fibrillation Brother    Cushing syndrome Daughter    Cancer Daughter        thyroid   Breast cancer Cousin    Hypotension Neg Hx    Malignant hyperthermia Neg Hx    Pseudochol deficiency Neg Hx    Social History   Socioeconomic History   Marital status: Divorced    Spouse name: Not on file   Number of children: 2   Years of education: Not on file    Highest education level: 12th grade  Occupational History   Occupation: retired  Tobacco Use   Smoking status: Never   Smokeless tobacco: Never  Vaping Use   Vaping Use: Never used  Substance and Sexual Activity   Alcohol use: Yes    Alcohol/week: 0.0 standard drinks    Comment: rarely   Drug use: No   Sexual activity: Never  Other Topics Concern   Not on file  Social History Narrative   Not on file   Social Determinants of Health   Financial Resource Strain: High Risk   Difficulty of Paying Living Expenses: Hard  Food Insecurity: No Food Insecurity   Worried  About Running Out of Food in the Last Year: Never true   Ran Out of Food in the Last Year: Never true  Transportation Needs: No Transportation Needs   Lack of Transportation (Medical): No   Lack of Transportation (Non-Medical): No  Physical Activity: Not on file  Stress: Not on file  Social Connections: Not on file    Tobacco Counseling Counseling given: Not Answered   Clinical Intake:  Pre-visit preparation completed: Yes  Pain : No/denies pain     Nutritional Risks: None Diabetes: No  How often do you need to have someone help you when you read instructions, pamphlets, or other written materials from your doctor or pharmacy?: 1 - Never  Diabetic?no  Interpreter Needed?: No  Information entered by :: Kirke Shaggy, LPN   Activities of Daily Living In your present state of health, do you have any difficulty performing the following activities: 10/19/2021 03/09/2021  Hearing? N -  Vision? N -  Difficulty concentrating or making decisions? N -  Walking or climbing stairs? Y -  Dressing or bathing? Y -  Doing errands, shopping? Y N  Some recent data might be hidden    Patient Care Team: Jerrol Banana., MD as PCP - General (Unknown Physician Specialty) Kate Sable, MD as PCP - Cardiology (Cardiology) Marlyn Corporal Clearnce Sorrel, PA-C as Physician Assistant (Family Medicine) Birder Robson, MD as Referring Physician (Ophthalmology) Vladimir Crofts, MD as Consulting Physician (Neurology) Bjorn Loser, MD as Consulting Physician (Urology) Newman Pies, MD as Consulting Physician (Neurosurgery) Chauncey Mann, MD as Referring Physician (Psychiatry) Richmond Campbell, MD as Consulting Physician (Gastroenterology) Neldon Labella, RN as Case Manager Clent Jacks, RN as Oncology Nurse Navigator Germaine Pomfret, [facility] (Pharmacist)  Indicate any recent Medical Services you may have received from other than [facility] providers in the past year (date may be approximate).     Assessment:   This is a routine wellness examination for [place].  Hearing/Vision screen No results found.  Dietary issues and exercise activities discussed:     Goals Addressed   None    Depression Screen PHQ 2/9 Scores 11/22/2021 02/16/2021 12/17/2019 04/10/2019 04/10/2019 03/12/2019 02/28/2019  PHQ - 2 Score 1 0 0 1 1 0 0  PHQ- 9 Score 2 0 4 3 3  - -    Fall Risk Fall Risk  11/25/2021 04/14/2020 12/17/2019 04/18/2019 04/10/2019  Falls in the past year? 1 0 0 0 0  Number falls in past yr: 1 0 0 - -  Injury with Fall? 1 0 0 - -  Risk for fall due to : History of fall(s);Impaired balance/gait - - - -  Follow up Falls prevention discussed - - - -    FALL RISK PREVENTION PERTAINING TO THE HOME:  Any stairs in or around the home? Yes  If so, are there any without handrails? No  Home free of loose throw rugs in walkways, pet beds, electrical cords, etc? Yes  Adequate lighting in your home to reduce risk of falls? Yes   ASSISTIVE DEVICES UTILIZED TO PREVENT FALLS:  Life alert? Yes  Use of a cane, walker or w/c? Yes  Grab bars in the bathroom? Yes  Shower chair or bench in shower? No  Elevated toilet seat or a handicapped toilet? Yes    Cognitive Function: MMSE - Mini Mental State Exam 08/01/2016  Orientation to time 5  Orientation to Place 5  Registration 3  Attention/ Calculation 5   Recall 2  Language-  name 2 objects 2  Language- repeat 1  Language- follow 3 step command 3  Language- read & follow direction 1  Write a sentence 1  Copy design 1  Total score 29     6CIT Screen 02/28/2019 01/04/2017  What Year? 0 points 0 points  What month? 0 points 0 points  What time? 0 points 3 points  Count back from 20 0 points 0 points  Months in reverse 0 points 0 points  Repeat phrase 0 points 0 points  Total Score 0 3    Immunizations Immunization History  Administered Date(s) Administered   Fluad Quad(high Dose 65+) 10/21/2019, 10/14/2020, 08/25/2021   Influenza Inj Mdck Quad Pf 10/27/2016   Influenza Split 10/05/2011, 10/10/2012   Influenza, High Dose Seasonal PF 09/04/2014, 09/01/2015, 10/05/2017, 10/10/2018   Influenza-Unspecified 10/12/2013, 08/12/2014, 09/04/2014   PFIZER(Purple Top)SARS-COV-2 Vaccination 02/06/2020, 03/03/2020, 11/11/2020   Pneumococcal Conjugate-13 03/26/2015   Pneumococcal Polysaccharide-23 10/11/1999, 04/10/2012   Td 08/23/1999   Tdap 05/20/2019   Tetanus 05/28/2019   Zoster, Live 09/26/2008    TDAP status: Up to date  Flu Vaccine status: Up to date  Pneumococcal vaccine status: Up to date  Covid-19 vaccine status: Completed vaccines  Qualifies for Shingles Vaccine? Yes   Zostavax completed Yes   Shingrix Completed?: No.    Education has been provided regarding the importance of this vaccine. Patient has been advised to call insurance company to determine out of pocket expense if they have not yet received this vaccine. Advised may also receive vaccine at local pharmacy or Health Dept. Verbalized acceptance and understanding.  Screening Tests Health Maintenance  Topic Date Due   Zoster Vaccines- Shingrix (1 of 2) Never done   COVID-19 Vaccine (4 - Booster for Pfizer series) 01/06/2021   DEXA SCAN  02/07/2023   COLONOSCOPY (Pts 45-36yrs Insurance coverage will need to be confirmed)  02/23/2023   TETANUS/TDAP  05/27/2029    Pneumonia Vaccine 67+ Years old  Completed   INFLUENZA VACCINE  Completed   Hepatitis C Screening  Completed   HPV VACCINES  Aged Out    Health Maintenance  Health Maintenance Due  Topic Date Due   Zoster Vaccines- Shingrix (1 of 2) Never done   COVID-19 Vaccine (4 - Booster for Pfizer series) 01/06/2021    Colorectal cancer screening: Type of screening: Colonoscopy. Completed 02/22/18. Repeat every 5 years  Mammogram status: Completed 03/01/21. Repeat every year  Bone Density status: Completed 02/07/18. Results reflect: Bone density results: NORMAL. Repeat every 5 years.  Lung Cancer Screening: (Low Dose CT Chest recommended if Age 57-80 years, 30 pack-year currently smoking OR have quit w/in 15years.) does not qualify.   Additional Screening:  Hepatitis C Screening: does qualify; Completed 01/09/17  Vision Screening: Recommended annual ophthalmology exams for early detection of glaucoma and other disorders of the eye. Is the patient up to date with their annual eye exam?  Yes  Who is the provider or what is the name of the office in which the patient attends annual eye exams? [facility] If pt is not established with a provider, would they like to be referred to a provider to establish care? No .   Dental Screening: Recommended annual dental exams for proper oral hygiene  Community Resource Referral / Chronic Care Management: CRR required this visit?  No   CCM required this visit?  No      Plan:     I have personally reviewed and noted the following in the  patients chart:   Medical and social history Use of alcohol, tobacco or illicit drugs  Current medications and supplements including opioid prescriptions.  Functional ability and status Nutritional status Physical activity Advanced directives List of other physicians Hospitalizations, surgeries, and ER visits in previous 12 months Vitals Screenings to include cognitive, depression, and falls Referrals  and appointments  In addition, I have reviewed and discussed with patient certain preventive protocols, quality metrics, and best practice recommendations. A written personalized care plan for preventive services as well as general preventive health recommendations were provided to patient.     Dionisio David, LPN   [birth year]   Nurse Notes: none

## 2022-01-13 NOTE — Patient Instructions (Signed)
[name] , Thank you for taking time to come for your Medicare Wellness Visit. I appreciate your ongoing commitment to your health goals. Please review the following plan we discussed and let me know if I can assist you in the future.   Screening recommendations/referrals: Colonoscopy: 02/22/18 Mammogram: 03/01/21 Bone Density: 02/07/18 Recommended yearly ophthalmology/optometry visit for glaucoma screening and checkup Recommended yearly dental visit for hygiene and checkup  Vaccinations: Influenza vaccine: 08/25/21 Pneumococcal vaccine: 03/26/15 Tdap vaccine: 05/28/19 Shingles vaccine: Zostavax 09/26/08   Covid-19:02/06/20, 03/03/20, 11/11/20  Advanced directives: no  Conditions/risks identified: none  Next appointment: Follow up in one year for your annual wellness visit 01/17/23 @ 2:20pm by phone   Preventive Care 65 Years and Older, Female Preventive care refers to lifestyle choices and visits with your health care provider that can promote health and wellness. What does preventive care include? A yearly physical exam. This is also called an annual well check. Dental exams once or twice a year. Routine eye exams. Ask your health care provider how often you should have your eyes checked. Personal lifestyle choices, including: Daily care of your teeth and gums. Regular physical activity. Eating a healthy diet. Avoiding tobacco and drug use. Limiting alcohol use. Practicing safe sex. Taking low-dose aspirin every day. Taking vitamin and mineral supplements as recommended by your health care provider. What happens during an annual well check? The services and screenings done by your health care provider during your annual well check will depend on your age, overall health, lifestyle risk factors, and family history of disease. Counseling  Your health care provider may ask you questions about your: Alcohol use. Tobacco use. Drug use. Emotional well-being. Home and  relationship well-being. Sexual activity. Eating habits. History of falls. Memory and ability to understand (cognition). Work and work Statistician. Reproductive health. Screening  You may have the following tests or measurements: Height, weight, and BMI. Blood pressure. Lipid and cholesterol levels. These may be checked every 5 years, or more frequently if you are over 67 years old. Skin check. Lung cancer screening. You may have this screening every year starting at age 63 if you have a 30-pack-year history of smoking and currently smoke or have quit within the past 15 years. Fecal occult blood test (FOBT) of the stool. You may have this test every year starting at age 49. Flexible sigmoidoscopy or colonoscopy. You may have a sigmoidoscopy every 5 years or a colonoscopy every 10 years starting at age 72. Hepatitis C blood test. Hepatitis B blood test. Sexually transmitted disease (STD) testing. Diabetes screening. This is done by checking your blood sugar (glucose) after you have not eaten for a while (fasting). You may have this done every 1-3 years. Bone density scan. This is done to screen for osteoporosis. You may have this done starting at age 75. Mammogram. This may be done every 1-2 years. Talk to your health care provider about how often you should have regular mammograms. Talk with your health care provider about your test results, treatment options, and if necessary, the need for more tests. Vaccines  Your health care provider may recommend certain vaccines, such as: Influenza vaccine. This is recommended every year. Tetanus, diphtheria, and acellular pertussis (Tdap, Td) vaccine. You may need a Td booster every 10 years. Zoster vaccine. You may need this after age 63. Pneumococcal 13-valent conjugate (PCV13) vaccine. One dose is recommended after age 57. Pneumococcal polysaccharide (PPSV23) vaccine. One dose is recommended after age 71. Talk to your health care  provider  about which screenings and vaccines you need and how often you need them. This information is not intended to replace advice given to you by your health care provider. Make sure you discuss any questions you have with your health care provider. Document Released: 12/25/2015 Document Revised: 08/17/2016 Document Reviewed: 09/29/2015 Elsevier Interactive Patient Education  2017 [place] Prevention in the Home Falls can cause injuries. They can happen to people of all ages. There are many things you can do to make your home safe and to help prevent falls. What can I do on the outside of my home? Regularly fix the edges of walkways and driveways and fix any cracks. Remove anything that might make you trip as you walk through a door, such as a raised step or threshold. Trim any bushes or trees on the path to your home. Use bright outdoor lighting. Clear any walking paths of anything that might make someone trip, such as rocks or tools. Regularly check to see if handrails are loose or broken. Make sure that both sides of any steps have handrails. Any raised decks and porches should have guardrails on the edges. Have any leaves, snow, or ice cleared regularly. Use sand or salt on walking paths during winter. Clean up any spills in your garage right away. This includes oil or grease spills. What can I do in the bathroom? Use night lights. Install grab bars by the toilet and in the tub and shower. Do not use towel bars as grab bars. Use non-skid mats or decals in the tub or shower. If you need to sit down in the shower, use a plastic, non-slip stool. Keep the floor dry. Clean up any water that spills on the floor as soon as it happens. Remove soap buildup in the tub or shower regularly. Attach bath mats securely with double-sided non-slip rug tape. Do not have throw rugs and other things on the floor that can make you trip. What can I do in the bedroom? Use night lights. Make sure  that you have a light by your bed that is easy to reach. Do not use any sheets or blankets that are too big for your bed. They should not hang down onto the floor. Have a firm chair that has side arms. You can use this for support while you get dressed. Do not have throw rugs and other things on the floor that can make you trip. What can I do in the kitchen? Clean up any spills right away. Avoid walking on wet floors. Keep items that you use a lot in easy-to-reach places. If you need to reach something above you, use a strong step stool that has a grab bar. Keep electrical cords out of the way. Do not use floor polish or wax that makes floors slippery. If you must use wax, use non-skid floor wax. Do not have throw rugs and other things on the floor that can make you trip. What can I do with my stairs? Do not leave any items on the stairs. Make sure that there are handrails on both sides of the stairs and use them. Fix handrails that are broken or loose. Make sure that handrails are as long as the stairways. Check any carpeting to make sure that it is firmly attached to the stairs. Fix any carpet that is loose or worn. Avoid having throw rugs at the top or bottom of the stairs. If you do have throw rugs, attach them to the  floor with carpet tape. Make sure that you have a light switch at the top of the stairs and the bottom of the stairs. If you do not have them, ask someone to add them for you. What else can I do to help prevent falls? Wear shoes that: Do not have high heels. Have rubber bottoms. Are comfortable and fit you well. Are closed at the toe. Do not wear sandals. If you use a stepladder: Make sure that it is fully opened. Do not climb a closed stepladder. Make sure that both sides of the stepladder are locked into place. Ask someone to hold it for you, if possible. Clearly mark and make sure that you can see: Any grab bars or handrails. First and last steps. Where the edge of  each step is. Use tools that help you move around (mobility aids) if they are needed. These include: Canes. Walkers. Scooters. Crutches. Turn on the lights when you go into a dark area. Replace any light bulbs as soon as they burn out. Set up your furniture so you have a clear path. Avoid moving your furniture around. If any of your floors are uneven, fix them. If there are any pets around you, be aware of where they are. Review your medicines with your doctor. Some medicines can make you feel dizzy. This can increase your chance of falling. Ask your doctor what other things that you can do to help prevent falls. This information is not intended to replace advice given to you by your health care provider. Make sure you discuss any questions you have with your health care provider. Document Released: 09/24/2009 Document Revised: 05/05/2016 Document Reviewed: 01/02/2015 Elsevier Interactive Patient Education  2017 [employer].

## 2022-01-14 DIAGNOSIS — A4159 Other Gram-negative sepsis: Secondary | ICD-10-CM | POA: Diagnosis not present

## 2022-01-14 DIAGNOSIS — K254 Chronic or unspecified gastric ulcer with hemorrhage: Secondary | ICD-10-CM | POA: Diagnosis not present

## 2022-01-15 LAB — COMPREHENSIVE METABOLIC PANEL
ALT: 10 IU/L (ref 0–32)
AST: 11 IU/L (ref 0–40)
Albumin/Globulin Ratio: 1.6 (ref 1.2–2.2)
Albumin: 3.9 g/dL (ref 3.7–4.7)
Alkaline Phosphatase: 119 IU/L (ref 44–121)
BUN/Creatinine Ratio: 15 (ref 12–28)
BUN: 17 mg/dL (ref 8–27)
Bilirubin Total: 0.3 mg/dL (ref 0.0–1.2)
CO2: 28 mmol/L (ref 20–29)
Calcium: 9.2 mg/dL (ref 8.7–10.3)
Chloride: 101 mmol/L (ref 96–106)
Creatinine, Ser: 1.12 mg/dL — ABNORMAL HIGH (ref 0.57–1.00)
Globulin, Total: 2.5 g/dL (ref 1.5–4.5)
Glucose: 110 mg/dL — ABNORMAL HIGH (ref 70–99)
Potassium: 4 mmol/L (ref 3.5–5.2)
Sodium: 140 mmol/L (ref 134–144)
Total Protein: 6.4 g/dL (ref 6.0–8.5)
eGFR: 51 mL/min/{1.73_m2} — ABNORMAL LOW (ref >59)

## 2022-01-17 ENCOUNTER — Other Ambulatory Visit: Payer: Self-pay

## 2022-01-17 ENCOUNTER — Ambulatory Visit
Admission: RE | Admit: 2022-01-17 | Discharge: 2022-01-17 | Disposition: A | Discharge status: Home / Self Care | Payer: Medicare Other | Source: Ambulatory Visit | Attending: Obstetrics and Gynecology | Admitting: Obstetrics and Gynecology

## 2022-01-17 DIAGNOSIS — N83202 Unspecified ovarian cyst, left side: Secondary | ICD-10-CM | POA: Diagnosis not present

## 2022-01-17 DIAGNOSIS — Z90710 Acquired absence of both cervix and uterus: Secondary | ICD-10-CM | POA: Diagnosis not present

## 2022-01-18 ENCOUNTER — Ambulatory Visit (INDEPENDENT_AMBULATORY_CARE_PROVIDER_SITE_OTHER): Payer: Medicare Other | Admitting: Nurse Practitioner

## 2022-01-18 ENCOUNTER — Encounter: Payer: Self-pay | Admitting: Physician Assistant

## 2022-01-18 VITALS — BP 128/82 | HR 80 | Ht 67.0 in | Wt 172.0 lb

## 2022-01-18 DIAGNOSIS — R001 Bradycardia, unspecified: Secondary | ICD-10-CM

## 2022-01-18 DIAGNOSIS — I10 Essential (primary) hypertension: Secondary | ICD-10-CM | POA: Diagnosis not present

## 2022-01-18 DIAGNOSIS — R072 Precordial pain: Secondary | ICD-10-CM | POA: Diagnosis not present

## 2022-01-18 DIAGNOSIS — I700 Atherosclerosis of aorta: Secondary | ICD-10-CM

## 2022-01-18 DIAGNOSIS — E7800 Pure hypercholesterolemia, unspecified: Secondary | ICD-10-CM | POA: Diagnosis not present

## 2022-01-18 DIAGNOSIS — E785 Hyperlipidemia, unspecified: Secondary | ICD-10-CM

## 2022-01-18 DIAGNOSIS — R0602 Shortness of breath: Secondary | ICD-10-CM | POA: Diagnosis not present

## 2022-01-18 NOTE — Progress Notes (Signed)
Cardiology Office Note:    Date:  01/18/2022   ID:  Nancy, Blackburn [birth year], MRN [id number]  PCP:  Jerrol Banana., MD   [facility] Providers Cardiologist:  Kate Sable, MD     Referring MD: Tyler Pita, MD   Chief Complaint: follow-up atherosclerosis seen on CT  History of Present Illness:    Nancy Blackburn is a 76 y.o. female with a hx of coronary calcification, dizziness, pre-syncope, hyperlipidemia, and Parkinson's.   Initially referred to cardiology by PCP for bradycardia and syncope.  She was hospitalized 7/21-7/23/21 due to dizziness, presyncope, and bradycardia.  She was having episodes of dizziness and passing out. Saw her PCP on day of admission noted to be bradycardic with heart rate of 38 and hypertensive at the time she was taking propanolol and Aricept.  Echo on 07/03/2020 showed normal EF 60-65%, impaired relaxation.  Heart rates improved with subsequent EKGs showing sinus bradycardia at 55 bpm, then increasing to 70s following the discontinuation of Aricept and propranolol. At office visit 08/31/20, she reported no further episodes of dizziness or syncope but reported her heart rate was "all over the place" with occasional lows in the 40s and highs in the 110s associated with diaphoresis. No evidence of orthostatic vital signs int the office hat day. Cardiac monitor revealed frequent PVCs/ventricular ectopy, minimum HR 55 bpm.  At 10/05/2020 office visit with reported improvement in symptoms of dizziness and syncope since stopping beta-blocker and Aricept.  Recommendation to continue to hold beta-blockers despite ventricular ectopy.  Plan for follow-up in 1 year.  At office visit 10/08/21, she reported increased shortness of breath since having the surgery March 2022.  Chest CT did not reveal any evidence for PE.  Repeat echocardiogram revealed LVEF 60 to 65%, no RWMA, mild concentric LVH, G1DD, normal RV, mild MR, mild TR. she had no  further episodes of bradycardia.  Her shortness of breath was felt to be likely due to deconditioning following the surgery. Graduated exercising was advised with plan for follow-up in 6 months.  She was hospitalized 11/7-11/15/22 after becoming disoriented and suffering a fall.  Her grandson had reported several days of weakness and generalized malaise and then he found her on the floor in the bathroom. Was found to have acute kidney injury, sepsis with acute renal failure, nontraumatic rhabdomyolysis, hypotension and altered mental status.  Creatinine initially was 3.9 with improvement to 1.3 upon discharge. CPK was 15,867 at its peak with improvement to 181; statin was discontinued in this setting.  She was transferred to rehabilitation after hospital discharge.   Today, she is here alone for follow-up of coronary artery calcifications on gated chest CT. She reports occasional occasional SOB and a feeling in chest that I cannot describe."  She has been working out with a trainer and has noted chest heaviness that she initially attributed to weightlifting.  States on one occasion the pain radiated down left arm. Pain occurs each time she works out and resolves with rest.  She is using a walker for long distances.  She denies palpitations, flutters. She denies lower extremity edema, fatigue, melena, hematuria, hemoptysis, diaphoresis, weakness, presyncope, syncope, orthopnea, and PND. Potential oophorectomy for ovarian cyst the size of a lemon and a questionable nodule, but gyn surgeon wanted her to see [place] prior to making any decisions about surgery.    Past Medical History:  Diagnosis Date   Arthritis    Asthma    Bradycardia    Cervical  cancer [facility]) 1978   Chronic sinus infection    Ejection fraction    a. EF 55-65%, echo, mild LVH, June, 2013, small pericardial effusion   Fracture, sacrum/coccyx ([place])    History of kidney stones    Hyperlipidemia    a. takes Simvastatin   Hypertension    a.  takes Metoprolol, Lisinopril, and amlodipine.   Low back pain    Mild dementia    Nasal polyps    Normal nuclear stress test    a. 07/2005 Nl nuc stress test.   Osteopenia    Osteoporosis    Palpitations    a. June 2013:  Event recorder normal sinus rhythm with rare PVCs - ? symptomatic PVC's.   Parkinson disease ([place])    Pericardial effusion    a. Small, echo, June, 2013   PONV (postoperative nausea and vomiting)     Past Surgical History:  Procedure Laterality Date   BACK SURGERY  08/2010   BLADDER SUSPENSION  2003   bone spur  2009   removed from right collar bone area and partial collar bone removed   CATARACT EXTRACTION W/ INTRAOCULAR LENS  IMPLANT, BILATERAL     EYE SURGERY     LUMBAR FUSION  2011, 2012   SHOULDER ARTHROSCOPY Right 2012   THYROIDECTOMY, PARTIAL  1976   TONSILLECTOMY     as child   TOTAL KNEE ARTHROPLASTY Right 03/09/2021   Procedure: Right TOTAL KNEE ARTHROPLASTY;  Surgeon: Thornton Park, MD;  Location: [facility] ORS;  Service: Orthopedics;  Laterality: Right;   TUBAL LIGATION  1977   VAGINAL HYSTERECTOMY  1978   d/t cervical cancer    Current Medications: Current Meds  Medication Sig   acetaminophen (TYLENOL) 500 MG tablet Take 500 mg by mouth every 6 (six) hours as needed.   albuterol (VENTOLIN HFA) 108 (90 Base) MCG/ACT inhaler Inhale 1-2 puffs into the lungs every 6 (six) hours as needed for wheezing or shortness of breath.   amLODipine (NORVASC) 5 MG tablet Take 1 tablet (5 mg total) by mouth daily.   Budeson-Glycopyrrol-Formoterol (BREZTRI AEROSPHERE) 160-9-4.8 MCG/ACT AERO Inhale 2 puffs into the lungs in the morning and at bedtime. Through [place] Patient Assistance   busPIRone (BUSPAR) 10 MG tablet Take 1 tablet (10 mg total) by mouth 2 (two) times daily.   Calcium Carb-Cholecalciferol (CALCIUM 600+D3) 600-800 MG-UNIT TABS Take 1 tablet by mouth daily.   cholecalciferol (VITAMIN D) 25 MCG tablet Take 1 tablet (1,000 Units total) by mouth daily.    citalopram (CELEXA) 40 MG tablet Take 40 mg by mouth daily.   fexofenadine (ALLEGRA) 180 MG tablet Take 180 mg by mouth at bedtime.   hydrochlorothiazide (HYDRODIURIL) 12.5 MG tablet Take 1 tablet (12.5 mg total) by mouth every morning.   LORazepam (ATIVAN) 0.5 MG tablet TAKE 1 TABLET BY MOUTH 3 TIMES DAILY AS NEEDED FOR ANXIETY   primidone (MYSOLINE) 50 MG tablet Take 50 mg by mouth at bedtime.   traZODone (DESYREL) 100 MG tablet Take 300 mg by mouth at bedtime.   vitamin B-12 (CYANOCOBALAMIN) 1000 MCG tablet Take 1,000 mcg by mouth daily.     Allergies:   Anti-inflammatory enzyme [nutritional supplements], Cymbalta [duloxetine hcl], Clarithromycin, Donepezil hcl, Nsaids, Ranitidine, and Venlafaxine   Social History   Socioeconomic History   Marital status: Divorced    Spouse name: Not on file   Number of children: 2   Years of education: Not on file   Highest education level: 12th grade  Occupational  History   Occupation: retired  Tobacco Use   Smoking status: Never   Smokeless tobacco: Never  Vaping Use   Vaping Use: Never used  Substance and Sexual Activity   Alcohol use: Yes    Comment: 1 glass of wine or mixed drink every 2-3 months   Drug use: No   Sexual activity: Never  Other Topics Concern   Not on file  Social History Narrative   Not on file   Social Determinants of Health   Financial Resource Strain: Low Risk    Difficulty of Paying Living Expenses: Not very hard  Food Insecurity: No Food Insecurity   Worried About Charity fundraiser in the Last Year: Never true   Ran Out of Food in the Last Year: Never true  Transportation Needs: No Transportation Needs   Lack of Transportation (Medical): No   Lack of Transportation (Non-Medical): No  Physical Activity: Insufficiently Active   Days of Exercise per Week: 2 days   Minutes of Exercise per Session: 60 min  Stress: Stress Concern Present   Feeling of Stress : Rather much  Social Connections: Moderately  Isolated   Frequency of Communication with Friends and Family: More than three times a week   Frequency of Social Gatherings with Friends and Family: Once a week   Attends Religious Services: More than 4 times per year   Active Member of [employer] or Organizations: No   Attends Music therapist: Never   Marital Status: Divorced     Family History: The patient's family history includes Angina in her mother; Atrial fibrillation in her brother; Breast cancer in her cousin; COPD in her father; Cancer in her daughter; Colon cancer in her mother; Cushing syndrome in her daughter; Heart disease in her father and mother; Heart murmur in her mother. There is no history of Hypotension, Malignant hyperthermia, or Pseudochol deficiency.  ROS:   Please see the history of present illness.   + dyspnea on exertion + chest pain All other systems reviewed and are negative.  Labs/Other Studies Reviewed:    The following studies were reviewed today:  Echo 10/22  Left Ventricle: Left ventricular ejection fraction, by estimation, is 60  to 65%. The left ventricle has normal function. The left ventricle has no  regional wall motion abnormalities. The average left ventricular global  longitudinal strain is -18.1 %.  The left ventricular internal cavity size was normal in size. There is  mild concentric left ventricular hypertrophy of the basal-septal segment.  Left ventricular diastolic parameters are consistent with Grade I  diastolic dysfunction (impaired relaxation).  Right Ventricle: The right ventricular size is normal. No increase in  right ventricular wall thickness. Right ventricular systolic function is  normal. There is normal pulmonary artery systolic pressure. The tricuspid  regurgitant velocity is 2.62 m/s, and  with an assumed right atrial pressure of 5 mmHg, the estimated right  ventricular systolic pressure is 62.8 mmHg.  Left Atrium: Left atrial size was normal in size.   Right Atrium: Right atrial size was normal in size.  Pericardium: A small pericardial effusion is present.  Mitral Valve: The mitral valve is normal in structure. Mild mitral valve  regurgitation. No evidence of mitral valve stenosis. MV peak gradient, 3.9  mmHg. The mean mitral valve gradient is 2.0 mmHg.  Tricuspid Valve: The tricuspid valve is normal in structure. Tricuspid  valve regurgitation is mild . No evidence of tricuspid stenosis.  Aortic Valve: The aortic valve is normal  in structure. Aortic valve  regurgitation is not visualized. Mild aortic valve sclerosis is present,  with no evidence of aortic valve stenosis. Aortic valve mean gradient  measures 5.0 mmHg. Aortic valve peak gradient measures 8.5 mmHg. Aortic valve area, by VTI measures 2.63 cm.  Pulmonic Valve: The pulmonic valve was normal in structure. Pulmonic valve  regurgitation is mild. No evidence of pulmonic stenosis.  Aorta: The aortic root is normal in size and structure.  Venous: The inferior vena cava is normal in size with greater than 50%  respiratory variability, suggesting right atrial pressure of 3 mmHg.  IAS/Shunts: No atrial level shunt detected by color flow Doppler.   CTA Chest PE 4/22 IMPRESSION: 1. No evidence of pulmonary embolism or other acute intrathoracic process. 2. Enlarged pulmonary trunk, which can be seen with pulmonary arterial hypertension. 3. Small hiatal hernia. 4. Aortic atherosclerosis.   Aortic Atherosclerosis (ICD10-I70.0).   Cardiac monitor 10/21  No significant arrhythmias noted. Patient triggered events were associated with ventricular ectopies. Isolated ventricular ectopies with frequent 25%.  Lexiscan Myoview 5/16  Low risk myocardial perfusion study with a small in size, mild in intensity fixed defect in the inferolateral wall consistent with breast attenuation artifact. No ischemia noted. Normal LVF.     Recent Labs: 04/14/2021: TSH 1.620 07/29/2021: NT-Pro BNP  293 10/18/2021: B Natriuretic Peptide 229.4; Magnesium 1.9 11/10/2021: Hemoglobin 12.4; Platelets 430 01/14/2022: ALT 10; BUN 17; Creatinine, Ser 1.12; Potassium 4.0; Sodium 140  Recent Lipid Panel    Component Value Date/Time   CHOL 200 (H) 04/12/2019 1116   TRIG 154 (H) 04/12/2019 1116   HDL 59 04/12/2019 1116   CHOLHDL 3.4 04/12/2019 1116   LDLCALC 110 (H) 04/12/2019 1116    Risk Assessment/Calculations:      Physical Exam:    VS:  BP 128/82 (BP Location: Left Arm, Patient Position: Sitting, Cuff Size: Large)    Pulse 80    Ht 5\' 7"  (1.702 m)    Wt 172 lb (78 kg)    SpO2 97%    BMI 26.94 kg/m     Wt Readings from Last 3 Encounters:  01/18/22 172 lb (78 kg)  12/31/21 168 lb 3.2 oz (76.3 kg)  12/21/21 171 lb (77.6 kg)     GEN:  Well nourished, well developed in no acute distress HEENT: Normal NECK: No JVD; No carotid bruits CARDIAC: RRR, no murmurs, rubs, gallops RESPIRATORY:  Clear to auscultation without rales, wheezing or rhonchi  ABDOMEN: Soft, non-tender, non-distended MUSCULOSKELETAL:  No edema; No deformity. 2+ pedal pulses, equal bilaterally SKIN: Warm and dry NEUROLOGIC:  Alert and oriented x 3 PSYCHIATRIC:  Normal affect   EKG:  EKG is ordered today.  The ekg ordered today demonstrates NSR at 83 bpm, LVH with early repolarization, LAD, no acute change from previous  Diagnoses:    1. Bradycardia   2. Shortness of breath   3. Precordial pain   4. Aortic atherosclerosis ([place])   5. Hyperlipidemia LDL goal <70    Assessment and Plan:    Chest pain: Having chest pain that is concerning for angina. No pain at present.  She also has dyspnea and uses a walker for long distances. Recently returned to the gym to work out with a Physiological scientist. We will get  lexiscan myoview for evaluation of ischemia.  Encouraged her not to exercise until evaluation is complete.  Continue amlodipine.   Dyspnea on exertion: LVEF 60-65%, G1DD, LVH by echo 10/22. No edema, orthopnea,  PND.  Will get myoview for evaluation of ischemia. No ectopy by EKG today but could consider repeat echocardiogram and cardiac monitor for evaluation of PVCs that may be contributing.   Bradycardia: EKG today reveals sinus rhythm at 83 bpm.  She denies weakness, dizziness, lightheadedness, presyncope, syncope.  No recent episodes of bradycardia.   PVCs: Quiescent for now. Consider repeat monitor at follow-up to qualify burden.   Aortic atherosclerosis/Hyperlipidemia LDL goal < 70: Images personally reviewed by me. Multivessel calcification noted in area of LAD and RCA in addition to aortic atherosclerosis noted by radiologist.  Statin was stopped in the setting of rhabdomyolysis during hospitalization. We will recheck lipid today and plan for aggressive lipid management due to calcification.   Essential hypertension: BP well controlled today. Has not been monitoring on a regular basis at home.  No recent reports of elevated blood pressure.  Continue amlodipine, chlorothiazide.    Dispositon: 4 week f/u with Dr. Garen Lah or APP   Shared Decision Making/Informed Consent The risks [chest pain, shortness of breath, cardiac arrhythmias, dizziness, blood pressure fluctuations, myocardial infarction, stroke/transient ischemic attack, nausea, vomiting, allergic reaction, radiation exposure, metallic taste sensation and life-threatening complications (estimated to be 1 in 10,000)], benefits (risk stratification, diagnosing coronary artery disease, treatment guidance) and alternatives of a nuclear stress test were discussed in detail with Ms. Audi and she agrees to proceed.    Medication Adjustments/Labs and Tests Ordered: Current medicines are reviewed at length with the patient today.  Concerns regarding medicines are outlined above.  Orders Placed This Encounter  Procedures   NM Myocar Multi W/Spect W/Wall Motion / EF   Lipid panel   Direct LDL   Cardiac Stress Test: Informed Consent  Details: Physician/Practitioner Attestation; Transcribe to consent form and obtain patient signature   EKG 12-Lead   No orders of the defined types were placed in this encounter.   Patient Instructions  Medication Instructions:  No changes at this time.  *If you need a refill on your cardiac medications before your next appointment, please call your pharmacy*   Lab Work: Lipid and Direct LDL today  If you have labs (blood work) drawn today and your tests are completely normal, you will receive your results only by: [place] (if you have MyChart) OR A paper copy in the mail If you have any lab test that is abnormal or we need to change your treatment, we will call you to review the results.   Testing/Procedures: [place]  Your caregiver has ordered a Stress Test with nuclear imaging. The purpose of this test is to evaluate the blood supply to your heart muscle. This procedure is referred to as a "Non-Invasive Stress Test." This is because other than having an IV started in your vein, nothing is inserted or "invades" your body. Cardiac stress tests are done to find areas of poor blood flow to the heart by determining the extent of coronary artery disease (CAD). Some patients exercise on a treadmill, which naturally increases the blood flow to your heart, while others who are  unable to walk on a treadmill due to physical limitations have a pharmacologic/chemical stress agent called Lexiscan . This medicine will mimic walking on a treadmill by temporarily increasing your coronary blood flow.   Please note: these test may take anywhere between 2-4 hours to complete  PLEASE REPORT TO [place] AT THE FIRST DESK WILL DIRECT YOU WHERE TO GO  Date of Procedure:____________  Arrival Time for Procedure:_____________  Instructions regarding medication:    _XX___:  Hold Hydrochlorothiazide the day of your test. You may take this after  testing.  PLEASE NOTIFY THE OFFICE AT LEAST 15 HOURS IN ADVANCE IF YOU ARE UNABLE TO KEEP YOUR APPOINTMENT.  [phone] AND  PLEASE NOTIFY NUCLEAR MEDICINE AT [facility] AT LEAST 24 HOURS IN ADVANCE IF YOU ARE UNABLE TO KEEP YOUR APPOINTMENT. [phone]  How to prepare for your Myoview test:  Do not eat or drink after midnight No caffeine for 24 hours prior to test No smoking 24 hours prior to test. Your medication may be taken with water.  If your doctor stopped a medication because of this test, do not take that medication. Ladies, please do not wear dresses.  Skirts or pants are appropriate. Please wear a short sleeve shirt. No perfume, cologne or lotion. Wear comfortable walking shoes. No heels!    Follow-Up: At [facility], you and your health needs are our priority.  As part of our continuing mission to provide you with exceptional heart care, we have created designated Provider Care Teams.  These Care Teams include your primary Cardiologist (physician) and Advanced Practice Providers (APPs -  Physician Assistants and Nurse Practitioners) who all work together to provide you with the care you need, when you need it.   Your next appointment:   1 month(s)  The format for your next appointment:   In Person  Provider:   Kate Sable, MD or Christell Faith, PA-C     Signed, Moya Duan, Lanice Schwab, NP  01/18/2022 4:24 PM    [place]

## 2022-01-18 NOTE — Patient Instructions (Signed)
Medication Instructions:  No changes at this time.  *If you need a refill on your cardiac medications before your next appointment, please call your pharmacy*   Lab Work: Lipid and Direct LDL today  If you have labs (blood work) drawn today and your tests are completely normal, you will receive your results only by: [place] (if you have MyChart) OR A paper copy in the mail If you have any lab test that is abnormal or we need to change your treatment, we will call you to review the results.   Testing/Procedures: [place]  Your caregiver has ordered a Stress Test with nuclear imaging. The purpose of this test is to evaluate the blood supply to your heart muscle. This procedure is referred to as a "Non-Invasive Stress Test." This is because other than having an IV started in your vein, nothing is inserted or "invades" your body. Cardiac stress tests are done to find areas of poor blood flow to the heart by determining the extent of coronary artery disease (CAD). Some patients exercise on a treadmill, which naturally increases the blood flow to your heart, while others who are  unable to walk on a treadmill due to physical limitations have a pharmacologic/chemical stress agent called Lexiscan . This medicine will mimic walking on a treadmill by temporarily increasing your coronary blood flow.   Please note: these test may take anywhere between 2-4 hours to complete  PLEASE REPORT TO [place] AT THE FIRST DESK WILL DIRECT YOU WHERE TO GO  Date of Procedure:____________  Arrival Time for Procedure:_____________  Instructions regarding medication:    _XX___:  Hold Hydrochlorothiazide the day of your test. You may take this after testing.  PLEASE NOTIFY THE OFFICE AT LEAST 44 HOURS IN ADVANCE IF YOU ARE UNABLE TO KEEP YOUR APPOINTMENT.  [phone] AND  PLEASE NOTIFY NUCLEAR MEDICINE AT [facility] AT LEAST 24 HOURS IN ADVANCE IF YOU ARE UNABLE TO  KEEP YOUR APPOINTMENT. [phone]  How to prepare for your Myoview test:  Do not eat or drink after midnight No caffeine for 24 hours prior to test No smoking 24 hours prior to test. Your medication may be taken with water.  If your doctor stopped a medication because of this test, do not take that medication. Ladies, please do not wear dresses.  Skirts or pants are appropriate. Please wear a short sleeve shirt. No perfume, cologne or lotion. Wear comfortable walking shoes. No heels!    Follow-Up: At [facility], you and your health needs are our priority.  As part of our continuing mission to provide you with exceptional heart care, we have created designated Provider Care Teams.  These Care Teams include your primary Cardiologist (physician) and Advanced Practice Providers (APPs -  Physician Assistants and Nurse Practitioners) who all work together to provide you with the care you need, when you need it.   Your next appointment:   1 month(s)  The format for your next appointment:   In Person  Provider:   Kate Sable, MD or Christell Faith, PA-C

## 2022-01-19 ENCOUNTER — Telehealth: Payer: Self-pay | Admitting: Obstetrics and Gynecology

## 2022-01-19 ENCOUNTER — Telehealth: Payer: Self-pay | Admitting: Emergency Medicine

## 2022-01-19 DIAGNOSIS — E785 Hyperlipidemia, unspecified: Secondary | ICD-10-CM

## 2022-01-19 LAB — LIPID PANEL
Chol/HDL Ratio: 6.2 ratio — ABNORMAL HIGH (ref 0.0–4.4)
Cholesterol, Total: 280 mg/dL — ABNORMAL HIGH (ref 100–199)
HDL: 45 mg/dL (ref >39)
LDL Chol Calc (NIH): 183 mg/dL — ABNORMAL HIGH (ref 0–99)
Triglycerides: 268 mg/dL — ABNORMAL HIGH (ref 0–149)
VLDL Cholesterol Cal: 52 mg/dL — ABNORMAL HIGH (ref 5–40)

## 2022-01-19 LAB — LDL CHOLESTEROL, DIRECT: LDL Direct: 186 mg/dL — ABNORMAL HIGH (ref 0–99)

## 2022-01-19 MED ORDER — ROSUVASTATIN CALCIUM 20 MG PO TABS: 20.0000 mg | ORAL_TABLET | Freq: Every day | ORAL | 0 refills | Status: DC

## 2022-01-19 NOTE — Telephone Encounter (Signed)
Pt called, results and recommendations gone over. Pt verbalized understanding and questions, if any, answered.   Crestor 20 mg daily sent in to patient's preferred pharmacy.   Repeat labs scheduled for 3/22

## 2022-01-19 NOTE — Telephone Encounter (Signed)
-----   Message from Emmaline Life, NP sent at 01/19/2022 10:38 AM EST ----- Cholesterol and triglycerides are very elevated. Her statin was stopped during hospitalization 11/22 due to rhabdomyolysis. Her liver function returned to normal at last check. Please have her start Crestor 20 mg and check a lipid panel and LFTs in 6 weeks. If she develops any muscle or joint aches or has other concerns once starting Crestor, stop it and give [place] a call.

## 2022-01-19 NOTE — Telephone Encounter (Signed)
Pt called in.  She has to be cleared by cardiology before she can have the needed surgery.  She is having a stress test on 2/10.  She wanted you to know that she will call back to schedule the surgery when she is cleared.  She wants you to do the surgery soon so that she can also follow up with you.

## 2022-01-20 ENCOUNTER — Other Ambulatory Visit: Payer: Self-pay

## 2022-01-20 NOTE — Telephone Encounter (Signed)
Patient is needing refills on Lorazepam 0.5 mg.  sent to [facility]. She is going to have some upcoming surgery and needs this for her knees.

## 2022-01-21 ENCOUNTER — Other Ambulatory Visit: Payer: Self-pay | Admitting: Family Medicine

## 2022-01-21 ENCOUNTER — Other Ambulatory Visit: Payer: Self-pay

## 2022-01-21 ENCOUNTER — Encounter
Admission: RE | Admit: 2022-01-21 | Discharge: 2022-01-21 | Disposition: A | Discharge status: Home / Self Care | Payer: Medicare Other | Source: Ambulatory Visit | Attending: Nurse Practitioner | Admitting: Nurse Practitioner

## 2022-01-21 DIAGNOSIS — R072 Precordial pain: Secondary | ICD-10-CM | POA: Insufficient documentation

## 2022-01-21 LAB — NM MYOCAR MULTI W/SPECT W/WALL MOTION / EF
LV dias vol: 41 mL (ref 46–106)
LV sys vol: 13 mL
Nuc Stress EF: 68 %
Peak HR: 89 {beats}/min
Percent HR: 61 %
Rest HR: 64 {beats}/min
Rest Nuclear Isotope Dose: 10.5 mCi
SDS: 1
SRS: 7
SSS: 1
ST Depression (mm): 0 mm
Stress Nuclear Isotope Dose: 32.2 mCi
TID: 0.96

## 2022-01-21 MED ORDER — REGADENOSON 0.4 MG/5ML IV SOLN
0.4000 mg | Freq: Once | INTRAVENOUS | Status: AC
  Administered 2022-01-21: 0.4 mg via INTRAVENOUS

## 2022-01-21 MED ORDER — TECHNETIUM TC 99M TETROFOSMIN IV KIT
30.0000 | PACK | Freq: Once | INTRAVENOUS | Status: AC | PRN
  Administered 2022-01-21: 32.2 via INTRAVENOUS

## 2022-01-21 MED ORDER — TECHNETIUM TC 99M TETROFOSMIN IV KIT
10.0000 | PACK | Freq: Once | INTRAVENOUS | Status: AC | PRN
  Administered 2022-01-21: 10.51 via INTRAVENOUS

## 2022-01-25 ENCOUNTER — Telehealth: Payer: Self-pay

## 2022-01-25 NOTE — Telephone Encounter (Signed)
Pt called in to advise that she had stress test done and had been seen by cardiology per [place]'s request. This was requested prior to having surgery. She said that CRS discussed an salpingo-oophorectomy.   If CRS would like to have a medical clearance form filled out I will do so, have her sign and fax to cardio. I also request from [place] that a surgery request be sent to me to schedule patient.

## 2022-01-28 NOTE — Telephone Encounter (Signed)
Called patient to schedule robot assisted bilateral salpingo-oophorectomy w Gilman Schmidt ([place] to assist)  DOS 03/02/22  H&P 3/15 @ 10:35   Pre-admit phone call appointment to be requested - date and time will be included on H&P paper work. Also all appointments will be updated on pt MyChart. Explained that this appointment has a call window. Based on the time scheduled will indicate if the call will be received within a 4 hour window before 1:00 or after.  Advised that pt may also receive calls from the hospital pharmacy and pre-service center.  Confirmed pt has BCBS as Chartered certified accountant. No secondary insurance.

## 2022-01-28 NOTE — Telephone Encounter (Signed)
Patient called asking if she needs to wait to have surgery since she knows that [facility] is leaving in [place]. I advised that Gilman Schmidt has been in the OR and out of the office since she called on 2/14. I told her that she is in clinic today and I will speak with her and follow back up with a phone call. She requested that I call her cell phone [phone].

## 2022-01-28 NOTE — Telephone Encounter (Signed)
Surgery Booking Request Patient Full Name:  Nancy Blackburn  MRN: [id number]  DOB: [birth year]  Surgeon: Homero Fellers, MD  Requested Surgery Date and Time: march 2023 Primary Diagnosis AND Code: left ovarian cyst Secondary Diagnosis and Code:  Surgical Procedure: Robot assisted BSO RNFA Requested?: No L&D Notification: No Admission Status: same day surgery Length of Surgery: 100 min Special Case Needs: No H&P: Yes Phone Interview???:  Yes Interpreter: No Medical Clearance:  Yes- Cardia and medical clearance Special Scheduling Instructions: Yes Any known health/anesthesia issues, diabetes, sleep apnea, latex allergy, defibrillator/pacemaker?: No Acuity: P2   (P1 highest, P2 delay may cause harm, P3 low, elective gyn, P4 lowest) Post op follow up visits: 1 week and 4 week post op

## 2022-01-31 ENCOUNTER — Telehealth: Payer: Self-pay

## 2022-01-31 NOTE — Progress Notes (Signed)
Chronic Care Management Pharmacy Assistant   Name: ADELEI SCOBEY  MRN: [id number] DOB: [birth year]  Reason for Encounter: Hypertension Disease State Call.   Recent office visits:  01/13/2022 Kirke Shaggy LPN (PCP Office) Medicare Wellness Completed  Recent consult visits:  01/19/2022 Christen Bame NP (Cardiology)  start Rosuvastatin 20 mg daily 01/18/2022 Christen Bame NP (Cardiology) No Medication Changes noted, follow up in 4 weeks 01/12/2022 Dr. Gilman Schmidt MD (OBGYN) No Medication changes noted  Hospital visits:  None in previous 6 months  Medications: Outpatient Encounter Medications as of 01/31/2022  Medication Sig   acetaminophen (TYLENOL) 500 MG tablet Take 500 mg by mouth every 6 (six) hours as needed.   albuterol (VENTOLIN HFA) 108 (90 Base) MCG/ACT inhaler Inhale 1-2 puffs into the lungs every 6 (six) hours as needed for wheezing or shortness of breath.   amLODipine (NORVASC) 5 MG tablet Take 1 tablet (5 mg total) by mouth daily.   Budeson-Glycopyrrol-Formoterol (BREZTRI AEROSPHERE) 160-9-4.8 MCG/ACT AERO Inhale 2 puffs into the lungs in the morning and at bedtime. Through [place] Patient Assistance   busPIRone (BUSPAR) 10 MG tablet Take 1 tablet (10 mg total) by mouth 2 (two) times daily.   Calcium Carb-Cholecalciferol (CALCIUM 600+D3) 600-800 MG-UNIT TABS Take 1 tablet by mouth daily.   cholecalciferol (VITAMIN D) 25 MCG tablet Take 1 tablet (1,000 Units total) by mouth daily.   citalopram (CELEXA) 40 MG tablet Take 40 mg by mouth daily.   fexofenadine (ALLEGRA) 180 MG tablet Take 180 mg by mouth at bedtime.   hydrochlorothiazide (HYDRODIURIL) 12.5 MG tablet Take 1 tablet (12.5 mg total) by mouth every morning.   LORazepam (ATIVAN) 0.5 MG tablet TAKE 1 TABLET BY MOUTH 3 TIMES DAILY AS NEEDED FOR ANXIETY   primidone (MYSOLINE) 50 MG tablet Take 50 mg by mouth at bedtime.   rosuvastatin (CRESTOR) 20 MG tablet Take 1 tablet (20 mg total) by mouth daily.    traZODone (DESYREL) 100 MG tablet Take 300 mg by mouth at bedtime.   vitamin B-12 (CYANOCOBALAMIN) 1000 MCG tablet Take 1,000 mcg by mouth daily.   No facility-administered encounter medications on file as of 01/31/2022.    Care Gaps: Shingrix Vaccine COVID-19 Vaccine   Star Rating Drugs: Rosuvastatin 20 mg last filled 01/19/2022 30 day supply at [facility].   Medication Fill Gaps: None ID   Reviewed chart prior to disease state call. Spoke with patient regarding BP  Recent Office Vitals: BP Readings from Last 3 Encounters:  01/18/22 128/82  12/31/21 120/70  12/21/21 140/89   Pulse Readings from Last 3 Encounters:  01/18/22 80  12/21/21 89  12/01/21 96    Wt Readings from Last 3 Encounters:  01/18/22 172 lb (78 kg)  12/31/21 168 lb 3.2 oz (76.3 kg)  12/21/21 171 lb (77.6 kg)     Kidney Function Lab Results  Component Value Date/Time   CREATININE 1.12 (H) 01/14/2022 11:16 AM   CREATININE 1.01 (H) 12/14/2021 09:41 AM   CREATININE 1.02 (H) 11/23/2017 02:35 PM   CREATININE 1.28 10/28/2014 05:55 PM   CREATININE 0.80 10/06/2013 05:18 AM   GFRNONAA 43 (L) 10/26/2021 05:34 AM   GFRNONAA 55 (L) 11/23/2017 02:35 PM   GFRAA 72 08/27/2020 03:02 PM   GFRAA 64 11/23/2017 02:35 PM    BMP Latest Ref Rng & Units 01/14/2022 12/14/2021 11/10/2021  Glucose 70 - 99 mg/dL 110(H) 123(H) 102(H)  BUN 8 - 27 mg/dL 17 14 10   Creatinine 0.57 - 1.00 mg/dL 1.12(H) 1.01(H)  1.07(H)  BUN/Creat Ratio 12 - 28 15 14  9(L)  Sodium 134 - 144 mmol/L 140 144 143  Potassium 3.5 - 5.2 mmol/L 4.0 3.7 3.7  Chloride 96 - 106 mmol/L 101 103 102  CO2 20 - 29 mmol/L 28 24 26   Calcium 8.7 - 10.3 mg/dL 9.2 9.5 9.3    Current antihypertensive regimen:  Amlodipine 5 mg daily  HCTZ 12.5 mg daily   Patient denies any headaches,lightheadedness or dizziness.  How often are you checking your Blood Pressure? infrequently Current home BP readings:  Patient states she does not remember what her blood  pressure ranges, but reports her blood pressure has been "good".  What recent interventions/DTPs have been made by any provider to improve Blood Pressure control since last CPP Visit:  None ID   Any recent hospitalizations or ED visits since last visit with CPP? No  What diet changes have been made to improve Blood Pressure Control?  Patient states she limits salt she and use more pepper to her diet.  What exercise is being done to improve your Blood Pressure Control?  Patient reports she was going to the  gym 2 times a week, but her cardiologist inform her to hold off on exercising until her follow up appointment.   Patient denies any questions/concerns for the clinical pharmacist  Adherence Review: Is the patient currently on ACE/ARB medication? No Does the patient have >5 day gap between last estimated fill dates? No  Telephone follow up appointment with care management team member scheduled for:  03/28/2022 at 3:00 PM   [place] Pharmacist Assistant [phone]

## 2022-02-01 ENCOUNTER — Telehealth: Payer: Self-pay | Admitting: Cardiology

## 2022-02-01 NOTE — Telephone Encounter (Signed)
° °  Name:  Nancy Blackburn  DOB:  1946/02/24  MRN:  [id number]   Primary Cardiologist: Kate Sable, MD  Chart reviewed as part of pre-operative protocol coverage. The patient has an upcoming visit scheduled with Dr. Garen Lah on 02/18/22 at which time clearance can be addressed in case there are any issues that would impact surgical recommendations.   Robot assisted bilateral salpingo - oophorectomy is not scheduled until 03/02/22 as below. I added pre-op FYI to appointment notes so that provider is aware to address at time of outpatient visit. Per office protocol the cardiology provider should forward their finalized clearance decision and recommendations regarding antiplatelet therapy to requesting party below.   I will route this message as FYI to requesting party and remove this message from the pre-op box as separate pre-op APP input not needed at this time. Please call with any questions.     Lenna Sciara, NP 02/01/2022, 2:05 PM

## 2022-02-01 NOTE — Telephone Encounter (Signed)
° °  Pre-operative Risk Assessment    Patient Name: Nancy Blackburn  DOB: [birth year] MRN: [id number]      Request for Surgical Clearance    Procedure:   Robot assisted bilateral salpingo - oophorectomy   Date of Surgery:  Clearance 03/02/22                                 Surgeon:  Wayne Both  Surgeon's Group or Practice Name:  [facility] Phone number:  [phone] Fax number:  [phone]   Type of Clearance Requested:   Medical   Type of Anesthesia:  Not Indicated   Additional requests/questions:    Manfred Arch   02/01/2022, 10:14 AM

## 2022-02-04 ENCOUNTER — Ambulatory Visit: Payer: Medicare Other | Admitting: Cardiology

## 2022-02-15 ENCOUNTER — Other Ambulatory Visit: Payer: Self-pay | Admitting: Nurse Practitioner

## 2022-02-15 DIAGNOSIS — E785 Hyperlipidemia, unspecified: Secondary | ICD-10-CM

## 2022-02-17 DIAGNOSIS — F331 Major depressive disorder, recurrent, moderate: Secondary | ICD-10-CM | POA: Diagnosis not present

## 2022-02-17 DIAGNOSIS — F411 Generalized anxiety disorder: Secondary | ICD-10-CM | POA: Diagnosis not present

## 2022-02-17 DIAGNOSIS — F5105 Insomnia due to other mental disorder: Secondary | ICD-10-CM | POA: Diagnosis not present

## 2022-02-17 NOTE — Telephone Encounter (Signed)
Orders placed.

## 2022-02-18 ENCOUNTER — Encounter: Payer: Self-pay | Admitting: Cardiology

## 2022-02-18 ENCOUNTER — Ambulatory Visit (INDEPENDENT_AMBULATORY_CARE_PROVIDER_SITE_OTHER): Payer: Medicare Other | Admitting: Cardiology

## 2022-02-18 ENCOUNTER — Other Ambulatory Visit: Payer: Self-pay

## 2022-02-18 VITALS — BP 130/64 | HR 93 | Ht 67.0 in | Wt 172.0 lb

## 2022-02-18 DIAGNOSIS — Z01810 Encounter for preprocedural cardiovascular examination: Secondary | ICD-10-CM

## 2022-02-18 DIAGNOSIS — I2510 Atherosclerotic heart disease of native coronary artery without angina pectoris: Secondary | ICD-10-CM | POA: Diagnosis not present

## 2022-02-18 DIAGNOSIS — M25561 Pain in right knee: Secondary | ICD-10-CM | POA: Diagnosis not present

## 2022-02-18 DIAGNOSIS — E7800 Pure hypercholesterolemia, unspecified: Secondary | ICD-10-CM | POA: Diagnosis not present

## 2022-02-18 NOTE — Progress Notes (Signed)
Cardiology Office Note:    Date:  02/18/2022   ID:  Nancy, Blackburn Dec 23, 1945, MRN [id number]  PCP:  Jerrol Banana., MD  [facility] Cardiologist:  Kate Sable, MD  [place] Electrophysiologist:  None   Referring MD: Jerrol Banana.,*   Chief Complaint  Patient presents with   Other    Pre op clearance -- March 22nd. Meds reviewed verbally with patient.     History of Present Illness:    Nancy Blackburn is a 76 y.o. female with a hx of CAD (cor ca2+ on chest CT), hyperlipidemia, Parkinson's disease, mild cognitive impairment, who presents for preop evaluation.  Patient recently had an abdominal imaging observed showing an ovarian cyst.  Blood work did reveal elevated levels concerning for ovarian cancer.  Salpingo-oophorectomy is being planned.  Previously seen for bradycardia or shortness of breath.  Beta-blocker and ARB's.  Improvements in heart rate.  Echocardiogram showed normal ejection fraction.  Has coronary calcifications on prior chest CT.    Underwent Lexiscan Myoview with no significant ischemia.  She feels well, denies chest pain or shortness of breath.  Undergoing a lot of stress due to daughter in her 54s having abdominal abscess as a complication from previous gastric bypass surgery.  Recently started on Crestor, unsure why it was stopped.  Has been on Crestor for about 2 months now uninterrupted.   Prior notes Lexiscan Myoview 09/2021 no significant ischemia. Echocardiogram 09/2021 EF 60 to 65%, impaired relaxation. She has a history of bleeding ulcers, asa, nsaids avoidance recommended by GI Patient was evaluated in the hospital from 7/21-7/23 due to dizziness, presyncope and bradycardia.  Prior to admission, she notes episodes of dizziness and passing out.  Saw her primary care on day of admission and was noted to be bradycardic with heart rate 38.  EKG in the hospital on 7/21 showed marked bradycardia heart rate 37.   Patient was also hypotensive with BP 91/58 upon chart review.  She used to be on propanolol and Aricept which were discontinued.  Echo on 07/03/2020 showed normal systolic function, EF 60 to 65%, impaired relaxation.   Her heart rates improved with subsequent EKG showing sinus bradycardia heart rate 55, and subsequently stayed consistently in the 70s after discontinuation of propanolol and Aricept.   Past Medical History:  Diagnosis Date   Arthritis    Asthma    Bradycardia    Cervical cancer ([place]) 1978   Chronic sinus infection    Ejection fraction    a. EF 55-65%, echo, mild LVH, June, 2013, small pericardial effusion   Fracture, sacrum/coccyx ([place])    History of kidney stones    Hyperlipidemia    a. takes Simvastatin   Hypertension    a. takes Metoprolol, Lisinopril, and amlodipine.   Low back pain    Mild dementia    Nasal polyps    Normal nuclear stress test    a. 07/2005 Nl nuc stress test.   Osteopenia    Osteoporosis    Palpitations    a. June 2013:  Event recorder normal sinus rhythm with rare PVCs - ? symptomatic PVC's.   Parkinson disease ([place])    Pericardial effusion    a. Small, echo, June, 2013   PONV (postoperative nausea and vomiting)     Past Surgical History:  Procedure Laterality Date   BACK SURGERY  08/2010   BLADDER SUSPENSION  2003   bone spur  2009   removed from right collar  bone area and partial collar bone removed   CATARACT EXTRACTION W/ INTRAOCULAR LENS  IMPLANT, BILATERAL     EYE SURGERY     LUMBAR FUSION  2011, 2012   SHOULDER ARTHROSCOPY Right 2012   THYROIDECTOMY, PARTIAL  1976   TONSILLECTOMY     as child   TOTAL KNEE ARTHROPLASTY Right 03/09/2021   Procedure: Right TOTAL KNEE ARTHROPLASTY;  Surgeon: Thornton Park, MD;  Location: [facility] ORS;  Service: Orthopedics;  Laterality: Right;   TUBAL LIGATION  1977   VAGINAL HYSTERECTOMY  1978   d/t cervical cancer    Current Medications: Current Meds  Medication Sig   acetaminophen  (TYLENOL) 500 MG tablet Take 500 mg by mouth every 6 (six) hours as needed for moderate pain.   albuterol (VENTOLIN HFA) 108 (90 Base) MCG/ACT inhaler Inhale 1-2 puffs into the lungs every 6 (six) hours as needed for wheezing or shortness of breath.   amLODipine (NORVASC) 5 MG tablet Take 1 tablet (5 mg total) by mouth daily.   Budeson-Glycopyrrol-Formoterol (BREZTRI AEROSPHERE) 160-9-4.8 MCG/ACT AERO Inhale 2 puffs into the lungs in the morning and at bedtime. Through [place] Patient Assistance   busPIRone (BUSPAR) 15 MG tablet Take 15 mg by mouth 2 (two) times daily.   Calcium Carb-Cholecalciferol (CALCIUM 600+D3) 600-800 MG-UNIT TABS Take 1 tablet by mouth daily.   cholecalciferol (VITAMIN D) 25 MCG tablet Take 1 tablet (1,000 Units total) by mouth daily.   citalopram (CELEXA) 40 MG tablet Take 40 mg by mouth daily.   fexofenadine (ALLEGRA) 180 MG tablet Take 180 mg by mouth at bedtime.   hydrochlorothiazide (HYDRODIURIL) 12.5 MG tablet Take 1 tablet (12.5 mg total) by mouth every morning.   LORazepam (ATIVAN) 0.5 MG tablet TAKE 1 TABLET BY MOUTH 3 TIMES DAILY AS NEEDED FOR ANXIETY   Menthol, Topical Analgesic, (BIOFREEZE EX) Apply 1 application. topically daily as needed (pain).   primidone (MYSOLINE) 50 MG tablet Take 50 mg by mouth at bedtime.   rosuvastatin (CRESTOR) 20 MG tablet TAKE 1 TABLET BY MOUTH ONCE A DAY   traZODone (DESYREL) 100 MG tablet Take 300 mg by mouth at bedtime.   vitamin B-12 (CYANOCOBALAMIN) 1000 MCG tablet Take 1,000 mcg by mouth daily.     Allergies:   Anti-inflammatory enzyme [nutritional supplements], Cymbalta [duloxetine hcl], Clarithromycin, Donepezil hcl, Nsaids, Ranitidine, and Venlafaxine   Social History   Socioeconomic History   Marital status: Divorced    Spouse name: Not on file   Number of children: 2   Years of education: Not on file   Highest education level: 12th grade  Occupational History   Occupation: retired  Tobacco Use   Smoking status:  Never   Smokeless tobacco: Never  Vaping Use   Vaping Use: Never used  Substance and Sexual Activity   Alcohol use: Yes    Comment: 1 glass of wine or mixed drink every 2-3 months   Drug use: No   Sexual activity: Never  Other Topics Concern   Not on file  Social History Narrative   Not on file   Social Determinants of Health   Financial Resource Strain: Low Risk    Difficulty of Paying Living Expenses: Not very hard  Food Insecurity: No Food Insecurity   Worried About Charity fundraiser in the Last Year: Never true   [place] in the Last Year: Never true  Transportation Needs: No Transportation Needs   Lack of Transportation (Medical): No   Lack of  Transportation (Non-Medical): No  Physical Activity: Insufficiently Active   Days of Exercise per Week: 2 days   Minutes of Exercise per Session: 60 min  Stress: Stress Concern Present   Feeling of Stress : Rather much  Social Connections: Moderately Isolated   Frequency of Communication with Friends and Family: More than three times a week   Frequency of Social Gatherings with Friends and Family: Once a week   Attends Religious Services: More than 4 times per year   Active Member of [employer] or Organizations: No   Attends Music therapist: Never   Marital Status: Divorced     Family History: The patient's family history includes Angina in her mother; Atrial fibrillation in her brother; Breast cancer in her cousin; COPD in her father; Cancer in her daughter; Colon cancer in her mother; Cushing syndrome in her daughter; Heart disease in her father and mother; Heart murmur in her mother. There is no history of Hypotension, Malignant hyperthermia, or Pseudochol deficiency.  ROS:   Please see the history of present illness.     All other systems reviewed and are negative.  EKGs/Labs/Other Studies Reviewed:    The following studies were reviewed today:   EKG:  EKG not ordered today.    Recent  Labs: 04/14/2021: TSH 1.620 07/29/2021: NT-Pro BNP 293 10/18/2021: B Natriuretic Peptide 229.4; Magnesium 1.9 11/10/2021: Hemoglobin 12.4; Platelets 430 01/14/2022: ALT 10; BUN 17; Creatinine, Ser 1.12; Potassium 4.0; Sodium 140  Recent Lipid Panel    Component Value Date/Time   CHOL 280 (H) 01/18/2022 1552   TRIG 268 (H) 01/18/2022 1552   HDL 45 01/18/2022 1552   CHOLHDL 6.2 (H) 01/18/2022 1552   LDLCALC 183 (H) 01/18/2022 1552   LDLDIRECT 186 (H) 01/18/2022 1552    Physical Exam:    VS:  BP 130/64 (BP Location: Left Arm, Patient Position: Sitting, Cuff Size: Normal)    Pulse 93    Ht '5\' 7"'$  (1.702 m)    Wt 172 lb (78 kg)    SpO2 96%    BMI 26.94 kg/m     Wt Readings from Last 3 Encounters:  02/18/22 172 lb (78 kg)  01/18/22 172 lb (78 kg)  12/31/21 168 lb 3.2 oz (76.3 kg)     GEN:  Well nourished, well developed in no acute distress, occasional tremors HEENT: Normal NECK: No JVD; No carotid bruits LYMPHATICS: No lymphadenopathy CARDIAC: RRR, no murmurs, rubs, gallops RESPIRATORY:  Clear to auscultation without rales, wheezing or rhonchi  ABDOMEN: Soft, non-tender, non-distended MUSCULOSKELETAL:  No edema; No deformity  SKIN: Warm and dry NEUROLOGIC:  Alert and oriented x 3, resting tremors noted PSYCHIATRIC:  Normal affect   ASSESSMENT:    1. Pre-operative cardiovascular examination   2. Pure hypercholesterolemia   3. Coronary artery disease involving native heart, unspecified vessel or lesion type, unspecified whether angina present      PLAN:    In order of problems listed above:  Preop cardiovascular exam, salpingo-oophorectomy being planned.  Shortness of breath, Lexiscan 01/2022, no evidence for ischemia, low risk study.  Echocardiogram 09/2021 EF 60 to 65%.  Patient can proceed with procedure from a cardiac perspective. History of hyperlipidemia, continue Crestor, obtain fasting lipid profile, titrate Crestor. Coronary artery calcification, history of bleeding  ulcers.  NSAIDs, aspirin not recommended by GI.  Lipitor as above.  Follow-up in 3 months   Medication Adjustments/Labs and Tests Ordered: Current medicines are reviewed at length with the patient today.  Concerns regarding medicines  are outlined above.  Orders Placed This Encounter  Procedures   Lipid panel    No orders of the defined types were placed in this encounter.    Patient Instructions  Medication Instructions:   Your physician recommends that you continue on your current medications as directed. Please refer to the Current Medication list given to you today.  *If you need a refill on your cardiac medications before your next appointment, please call your pharmacy*   Lab Work:  Your physician recommends that you return for a FASTING lipid profile: Next week  - You will need to be fasting. Please do not have anything to eat or drink after midnight the morning you have the lab work. You may only have water or black coffee with no cream or sugar.     Testing/Procedures: None ordered   Follow-Up: At [facility], you and your health needs are our priority.  As part of our continuing mission to provide you with exceptional heart care, we have created designated Provider Care Teams.  These Care Teams include your primary Cardiologist (physician) and Advanced Practice Providers (APPs -  Physician Assistants and Nurse Practitioners) who all work together to provide you with the care you need, when you need it.  We recommend signing up for the patient portal called "MyChart".  Sign up information is provided on this After Visit Summary.  MyChart is used to connect with patients for Virtual Visits (Telemedicine).  Patients are able to view lab/test results, encounter notes, upcoming appointments, etc.  Non-urgent messages can be sent to your provider as well.   To learn more about what you can do with MyChart, go to [email or web address].    Your next appointment:   3  month(s)  The format for your next appointment:   In Person  Provider:   You may see Kate Sable, MD or one of the following Advanced Practice Providers on your designated Care Team:   Murray Hodgkins, NP Christell Faith, PA-C Cadence Kathlen Mody, [place]    Other Instructions     Signed, Kate Sable, MD  02/18/2022 3:00 PM    [place]

## 2022-02-18 NOTE — Patient Instructions (Signed)
Medication Instructions:  ? ?Your physician recommends that you continue on your current medications as directed. Please refer to the Current Medication list given to you today. ? ?*If you need a refill on your cardiac medications before your next appointment, please call your pharmacy* ? ? ?Lab Work: ? ?Your physician recommends that you return for a FASTING lipid profile: Next week ? ?- You will need to be fasting. Please do not have anything to eat or drink after midnight the morning you have the lab work. You may only have water or black coffee with no cream or sugar.  ? ? ? ?Testing/Procedures: ?None ordered ? ? ?Follow-Up: ?At [facility], you and your health needs are our priority.  As part of our continuing mission to provide you with exceptional heart care, we have created designated Provider Care Teams.  These Care Teams include your primary Cardiologist (physician) and Advanced Practice Providers (APPs -  Physician Assistants and Nurse Practitioners) who all work together to provide you with the care you need, when you need it. ? ?We recommend signing up for the patient portal called "MyChart".  Sign up information is provided on this After Visit Summary.  MyChart is used to connect with patients for Virtual Visits (Telemedicine).  Patients are able to view lab/test results, encounter notes, upcoming appointments, etc.  Non-urgent messages can be sent to your provider as well.   ?To learn more about what you can do with MyChart, go to [email or web address].   ? ?Your next appointment:   ?3 month(s) ? ?The format for your next appointment:   ?In Person ? ?Provider:   ?You may see Kate Sable, MD or one of the following Advanced Practice Providers on your designated Care Team:   ?Murray Hodgkins, NP ?Christell Faith, PA-C ?Cadence Kathlen Mody, PA-C  ? ? ?Other Instructions ? ? ?

## 2022-02-22 ENCOUNTER — Telehealth: Payer: Self-pay | Admitting: Cardiology

## 2022-02-22 ENCOUNTER — Encounter: Payer: Self-pay | Admitting: Obstetrics and Gynecology

## 2022-02-22 ENCOUNTER — Encounter
Admission: RE | Admit: 2022-02-22 | Discharge: 2022-02-22 | Disposition: A | Discharge status: Home / Self Care | Payer: Medicare Other | Source: Ambulatory Visit | Attending: Obstetrics and Gynecology | Admitting: Obstetrics and Gynecology

## 2022-02-22 ENCOUNTER — Other Ambulatory Visit: Payer: Self-pay

## 2022-02-22 VITALS — Ht 67.0 in | Wt 172.0 lb

## 2022-02-22 DIAGNOSIS — G250 Essential tremor: Secondary | ICD-10-CM | POA: Diagnosis not present

## 2022-02-22 DIAGNOSIS — G4733 Obstructive sleep apnea (adult) (pediatric): Secondary | ICD-10-CM | POA: Diagnosis not present

## 2022-02-22 DIAGNOSIS — Z8659 Personal history of other mental and behavioral disorders: Secondary | ICD-10-CM | POA: Diagnosis not present

## 2022-02-22 DIAGNOSIS — R2681 Unsteadiness on feet: Secondary | ICD-10-CM | POA: Diagnosis not present

## 2022-02-22 DIAGNOSIS — R259 Unspecified abnormal involuntary movements: Secondary | ICD-10-CM | POA: Diagnosis not present

## 2022-02-22 DIAGNOSIS — R7989 Other specified abnormal findings of blood chemistry: Secondary | ICD-10-CM

## 2022-02-22 DIAGNOSIS — E7849 Other hyperlipidemia: Secondary | ICD-10-CM

## 2022-02-22 HISTORY — DX: Atherosclerotic heart disease of native coronary artery without angina pectoris: I25.10

## 2022-02-22 HISTORY — DX: Nontoxic single thyroid nodule: E04.1

## 2022-02-22 HISTORY — DX: Pneumonia, unspecified organism: J18.9

## 2022-02-22 HISTORY — DX: Chronic or unspecified gastric ulcer with hemorrhage: K25.4

## 2022-02-22 NOTE — Telephone Encounter (Signed)
Preadmit will do ordered cardiology labs same day as previously scheduled visit .  ?

## 2022-02-22 NOTE — Patient Instructions (Addendum)
Your procedure is scheduled on: Wednesday, March 22 ?Report to the Registration Desk on the 1st floor of the [facility]. ?To find out your arrival time, please call [phone] between 1PM - 3PM on: Tuesday, March 21 ? ?REMEMBER: ?Instructions that are not followed completely may result in serious medical risk, up to and including death; or upon the discretion of your surgeon and anesthesiologist your surgery may need to be rescheduled. ? ?Do not eat food after midnight the night before surgery.  ?No gum chewing, lozengers or hard candies. ? ?You may however, drink CLEAR liquids up to 2 hours before you are scheduled to arrive for your surgery. Do not drink anything within 2 hours of your scheduled arrival time. ? ?Clear liquids include: ?- water  ?- apple juice without pulp ?- gatorade (not RED colors) ?- black coffee or tea (Do NOT add milk or creamers to the coffee or tea) ?Do NOT drink anything that is not on this list. ? ?In addition, your doctor has ordered for you to drink the provided  ?Ensure Pre-Surgery Clear Carbohydrate Drink  ?Drinking this carbohydrate drink up to two hours before surgery helps to reduce insulin resistance and improve patient outcomes. Please complete drinking 2 hours prior to scheduled arrival time. ? ?TAKE THESE MEDICATIONS THE MORNING OF SURGERY WITH A SIP OF WATER: ? ?Amlodipine ?Breztri inhaler ?Buspirone ?Citalopram ? ?Use inhalers on the day of surgery and bring your albuterol inhaler to the hospital. ? ?One week prior to surgery: starting March 15 ?Stop Anti-inflammatories (NSAIDS) such as Advil, Aleve, Ibuprofen, Motrin, Naproxen, Naprosyn and Aspirin based products such as Excedrin, Goodys Powder, BC Powder. ?Stop ANY OVER THE COUNTER supplements until after surgery. Stop vitamin D, calcium ?You may however, continue to take Tylenol if needed for pain up until the day of surgery. ? ?No Alcohol for 24 hours before or after surgery. ? ?No Smoking including e-cigarettes for  24 hours prior to surgery.  ?No chewable tobacco products for at least 6 hours prior to surgery.  ?No nicotine patches on the day of surgery. ? ?Do not use any "recreational" drugs for at least a week prior to your surgery.  ?Please be advised that the combination of cocaine and anesthesia may have negative outcomes, up to and including death. ?If you test positive for cocaine, your surgery will be cancelled. ? ?On the morning of surgery brush your teeth with toothpaste and water, you may rinse your mouth with mouthwash if you wish. ?Do not swallow any toothpaste or mouthwash. ? ?Use CHG Soap as directed on instruction sheet. ? ?Do not wear jewelry, make-up, hairpins, clips or nail polish. ? ?Do not wear lotions, powders, or perfumes.  ? ?Do not shave body from the neck down 48 hours prior to surgery just in case you cut yourself which could leave a site for infection.  ?Also, freshly shaved skin may become irritated if using the CHG soap. ? ?Contact lenses, hearing aids and dentures may not be worn into surgery. ? ?Do not bring valuables to the hospital. [facility] is not responsible for any missing/lost belongings or valuables.  ? ?Notify your doctor if there is any change in your medical condition (cold, fever, infection). ? ?Wear comfortable clothing (specific to your surgery type) to the hospital. ? ?After surgery, you can help prevent lung complications by doing breathing exercises.  ?Take deep breaths and cough every 1-2 hours. Your doctor may order a device called an Incentive Spirometer to help you take  deep breaths. ?When coughing or sneezing, hold a pillow firmly against your incision with both hands. This is called ?splinting.? Doing this helps protect your incision. It also decreases belly discomfort. ? ?If you are being discharged the day of surgery, you will not be allowed to drive home. ?You will need a responsible adult (18 years or older) to drive you home and stay with you that night.  ? ?If you  are taking public transportation, you will need to have a responsible adult (18 years or older) with you. ?Please confirm with your physician that it is acceptable to use public transportation.  ? ?Please call the [facility] Dept. at [phone] if you have any questions about these instructions. ? ?Surgery Visitation Policy: ? ?Patients undergoing a surgery or procedure may have one family member or support person with them as long as that person is not COVID-19 positive or experiencing its symptoms.  ?That person may remain in the waiting area during the procedure and may rotate out with other people. ?

## 2022-02-23 ENCOUNTER — Encounter: Payer: Self-pay | Admitting: Obstetrics and Gynecology

## 2022-02-23 ENCOUNTER — Encounter
Admission: RE | Admit: 2022-02-23 | Discharge: 2022-02-23 | Disposition: A | Discharge status: Home / Self Care | Payer: Medicare Other | Source: Ambulatory Visit | Attending: Obstetrics and Gynecology | Admitting: Obstetrics and Gynecology

## 2022-02-23 ENCOUNTER — Other Ambulatory Visit: Payer: Medicare Other

## 2022-02-23 ENCOUNTER — Other Ambulatory Visit: Payer: Self-pay | Admitting: Family Medicine

## 2022-02-23 ENCOUNTER — Ambulatory Visit (INDEPENDENT_AMBULATORY_CARE_PROVIDER_SITE_OTHER): Payer: Medicare Other | Admitting: Obstetrics and Gynecology

## 2022-02-23 VITALS — BP 120/80 | Ht 67.0 in | Wt 172.0 lb

## 2022-02-23 DIAGNOSIS — R7989 Other specified abnormal findings of blood chemistry: Secondary | ICD-10-CM

## 2022-02-23 DIAGNOSIS — D509 Iron deficiency anemia, unspecified: Secondary | ICD-10-CM | POA: Insufficient documentation

## 2022-02-23 DIAGNOSIS — E7800 Pure hypercholesterolemia, unspecified: Secondary | ICD-10-CM | POA: Insufficient documentation

## 2022-02-23 DIAGNOSIS — Z01812 Encounter for preprocedural laboratory examination: Secondary | ICD-10-CM | POA: Diagnosis not present

## 2022-02-23 DIAGNOSIS — N83202 Unspecified ovarian cyst, left side: Secondary | ICD-10-CM | POA: Insufficient documentation

## 2022-02-23 DIAGNOSIS — E7849 Other hyperlipidemia: Secondary | ICD-10-CM | POA: Diagnosis not present

## 2022-02-23 LAB — CBC
HCT: 44.6 % (ref 36.0–46.0)
Hemoglobin: 13.9 g/dL (ref 12.0–15.0)
MCH: 27.9 pg (ref 26.0–34.0)
MCHC: 31.2 g/dL (ref 30.0–36.0)
MCV: 89.4 fL (ref 80.0–100.0)
Platelets: 287 10*3/uL (ref 150–400)
RBC: 4.99 MIL/uL (ref 3.87–5.11)
RDW: 13.1 % (ref 11.5–15.5)
WBC: 8.8 10*3/uL (ref 4.0–10.5)
nRBC: 0 % (ref 0.0–0.2)

## 2022-02-23 LAB — COMPREHENSIVE METABOLIC PANEL
ALT: 11 U/L (ref 0–44)
AST: 15 U/L (ref 15–41)
Albumin: 3.5 g/dL (ref 3.5–5.0)
Alkaline Phosphatase: 82 U/L (ref 38–126)
Anion gap: 5 (ref 5–15)
BUN: 17 mg/dL (ref 8–23)
CO2: 31 mmol/L (ref 22–32)
Calcium: 8.9 mg/dL (ref 8.9–10.3)
Chloride: 104 mmol/L (ref 98–111)
Creatinine, Ser: 0.97 mg/dL (ref 0.44–1.00)
GFR, Estimated: >60 mL/min (ref >60)
Glucose, Bld: 97 mg/dL (ref 70–99)
Potassium: 3.3 mmol/L — ABNORMAL LOW (ref 3.5–5.1)
Sodium: 140 mmol/L (ref 135–145)
Total Bilirubin: 0.6 mg/dL (ref 0.3–1.2)
Total Protein: 6.5 g/dL (ref 6.5–8.1)

## 2022-02-23 LAB — LIPID PANEL
Cholesterol: 172 mg/dL (ref 0–200)
HDL: 50 mg/dL (ref >40)
LDL Cholesterol: 83 mg/dL (ref 0–99)
Total CHOL/HDL Ratio: 3.4 RATIO
Triglycerides: 197 mg/dL — ABNORMAL HIGH (ref <150)
VLDL: 39 mg/dL (ref 0–40)

## 2022-02-23 LAB — TYPE AND SCREEN
ABO/RH(D): O NEG
Antibody Screen: NEGATIVE

## 2022-02-23 NOTE — H&P (View-Only) (Signed)
? ?Patient ID: Nancy Blackburn, female   DOB: Dec 19, 1945, 76 y.o.   MRN: [id number] ? ?Reason for Consult: Pre-op Exam ?  ?Referred by Jerrol Banana.,* ? ?Subjective:  ?   ?HPI: ? ?Nancy Blackburn is a 76 y.o. female she is here today for preoperative visit.  She has a left adnexal cyst which was measured 8 x 8.2 x 7.8 in size.  It has had a partial septation and questionable debris.  She has had some pelvic discomfort from this ovarian cyst.  She would like to have it removed.  She would also like to have her right ovary and bilateral fallopian tubes removed at the same time.  She has previously met with oncology who has felt like risk of malignancy is low and encouraged observation however the patient desires to proceed with surgery.  She has received clearance from her cardiologist.  She is following up with her primary care physician shortly.  She has plans for friends and family members to stay with her at home as she recovers after the surgery.   ? ?Gynecological History ? ?No LMP recorded. Patient has had a hysterectomy. ? ?Past Medical History:  ?Diagnosis Date  ? Adnexal cyst 10/18/2021  ? a.) CT abdomen: large cystic mass in the LEFT hemipelvis measuring 8.5 cm. b.) Pelvis US 10/22/2021: adnexal cyst measuring 8.0 x 8.2 x 7.9 (volume 270 cm?). c.) Pelvis US 01/17/2022: cystic mass measured 8.2 x 8.0 x 8.3 (volume 290 cm?).  ? Anxiety   ? a.) on BZO (lorazepam) therapy PRN  ? Arthritis   ? Asthma   ? B12 deficiency   ? Benign essential tremor   ? Bleeding gastric ulcer   ? Bradycardia   ? Cervical cancer ([place]) 1978  ? Chronic sinus infection   ? Coronary artery disease   ? a.) CT chest 04/02/2021: coronary calcifications. b.) Lexi 01/21/2022: calcifications near [facility].  ? Depression   ? Diastolic dysfunction   ? a.) TTE 05/24/2012: EF 55-65%; triv MR, mild PR; G1DD. b.) TTE 05/24/2012: EF 55-65%; mild LVH; triv MR, mild TR/PR; G1DD. c.) TTE 04/15/2015: EF 60-65%; mild LVH; mild LA dilation;  mild PR, triv TR; G2DD. d.) TTE 07/03/20 and 09/28/2021: EF 60-65%; mild LVH; G1DD.  ? Fracture, sacrum/coccyx ([place])   ? GERD (gastroesophageal reflux disease)   ? Hiatal hernia   ? History of kidney stones   ? Hyperlipidemia   ? Hypertension   ? Low back pain   ? Mild dementia   ? Nasal polyps   ? Normal nuclear stress test   ? a. 07/2005 Nl nuc stress test.  ? Osteopenia   ? Osteoporosis   ? Palpitations   ? a. June 2013:  Event recorder normal sinus rhythm with rare PVCs - ? symptomatic PVC's.  ? Parkinson disease ([place])   ? Pericardial effusion   ? a.) TTE 05/24/2012: small. b.) TTE 04/15/2015: trivial. c.) TTE 09/28/2021: small.  ? Pneumonia   ? PONV (postoperative nausea and vomiting)   ? Thyroid nodule   ? Vitamin D deficiency   ? ?Family History  ?Problem Relation Age of Onset  ? Colon cancer Mother   ? Heart murmur Mother   ? Heart disease Mother   ? Angina Mother   ? Heart disease Father   ? COPD Father   ? Atrial fibrillation Brother   ? Cushing syndrome Daughter   ? Cancer Daughter   ?     thyroid  ?  Breast cancer Cousin   ? Hypotension Neg Hx   ? Malignant hyperthermia Neg Hx   ? Pseudochol deficiency Neg Hx   ? ?Past Surgical History:  ?Procedure Laterality Date  ? BACK SURGERY  08/2010  ? BLADDER SUSPENSION  2003  ? bone spur  2009  ? removed from right collar bone area and partial collar bone removed  ? CATARACT EXTRACTION W/ INTRAOCULAR LENS  IMPLANT, BILATERAL    ? EYE SURGERY    ? LUMBAR FUSION  2011, 2012  ? SHOULDER ARTHROSCOPY Right 2012  ? THYROIDECTOMY, PARTIAL  1976  ? TONSILLECTOMY    ? as child  ? TOTAL KNEE ARTHROPLASTY Right 03/09/2021  ? Procedure: Right TOTAL KNEE ARTHROPLASTY;  Surgeon: Thornton Park, MD;  Location: [facility] ORS;  Service: Orthopedics;  Laterality: Right;  ? TUBAL LIGATION  1977  ? VAGINAL HYSTERECTOMY  1978  ? d/t cervical cancer  ? ? ?Short Social History:  ?Social History  ? ?Tobacco Use  ? Smoking status: Never  ? Smokeless tobacco: Never  ?Substance Use Topics  ?  Alcohol use: Yes  ?  Comment: 1 glass of wine or mixed drink every 2-3 months  ? ? ?Allergies  ?Allergen Reactions  ? Anti-Inflammatory Enzyme [Nutritional Supplements] Other (See Comments)  ?  REACTION: Due to bleeding ulcer  ? Cymbalta [Duloxetine Hcl] Other (See Comments)  ?  REACTION: Urinary retention  ? Clarithromycin Other (See Comments)  ?  Upsets stomach  ? Donepezil Hcl Other (See Comments)  ?  Significant bradycardia causing syncope  ? Nsaids   ?  GI BLEED  ? Ranitidine Diarrhea  ?  severe diarrhea  ? Venlafaxine   ?  severe depression  ? ? ?Current Outpatient Medications  ?Medication Sig Dispense Refill  ? acetaminophen (TYLENOL) 500 MG tablet Take 500 mg by mouth every 6 (six) hours as needed for moderate pain.    ? albuterol (VENTOLIN HFA) 108 (90 Base) MCG/ACT inhaler Inhale 1-2 puffs into the lungs every 6 (six) hours as needed for wheezing or shortness of breath. 8 g 2  ? amLODipine (NORVASC) 5 MG tablet Take 1 tablet (5 mg total) by mouth daily.    ? Budeson-Glycopyrrol-Formoterol (BREZTRI AEROSPHERE) 160-9-4.8 MCG/ACT AERO Inhale 2 puffs into the lungs in the morning and at bedtime. Through [place] Patient Assistance 32.1 g 3  ? busPIRone (BUSPAR) 15 MG tablet Take 15 mg by mouth 2 (two) times daily.    ? Calcium Carb-Cholecalciferol (CALCIUM 600+D3) 600-800 MG-UNIT TABS Take 1 tablet by mouth daily.    ? cholecalciferol (VITAMIN D) 25 MCG tablet Take 1 tablet (1,000 Units total) by mouth daily. 30 tablet 0  ? citalopram (CELEXA) 40 MG tablet Take 40 mg by mouth daily.    ? fexofenadine (ALLEGRA) 180 MG tablet Take 180 mg by mouth at bedtime.    ? hydrochlorothiazide (HYDRODIURIL) 12.5 MG tablet Take 1 tablet (12.5 mg total) by mouth every morning. 90 tablet 1  ? LORazepam (ATIVAN) 0.5 MG tablet TAKE 1 TABLET BY MOUTH 3 TIMES DAILY AS NEEDED FOR ANXIETY 90 tablet 1  ? Menthol, Topical Analgesic, (BIOFREEZE EX) Apply 1 application. topically daily as needed (pain).    ? primidone (MYSOLINE) 50 MG  tablet Take 50 mg by mouth at bedtime.    ? rosuvastatin (CRESTOR) 20 MG tablet TAKE 1 TABLET BY MOUTH ONCE A DAY (Patient taking differently: Take 20 mg by mouth at bedtime.) 30 tablet 0  ? traZODone (DESYREL) 100 MG tablet  Take 300 mg by mouth at bedtime.    ? vitamin B-12 (CYANOCOBALAMIN) 1000 MCG tablet Take 1,000 mcg by mouth daily.    ? ?No current facility-administered medications for this visit.  ? ? ?Review of Systems  ?Constitutional: Negative for chills, fatigue, fever and unexpected weight change.  ?HENT: Negative for trouble swallowing.  ?Eyes: Negative for loss of vision.  ?Respiratory: Negative for cough, shortness of breath and wheezing.  ?Cardiovascular: Negative for chest pain, leg swelling, palpitations and syncope.  ?GI: Negative for abdominal pain, blood in stool, diarrhea, nausea and vomiting.  ?GU: Negative for difficulty urinating, dysuria, frequency and hematuria.  ?Musculoskeletal: Negative for back pain, leg pain and joint pain.  ?Skin: Negative for rash.  ?Neurological: Negative for dizziness, headaches, light-headedness, numbness and seizures.  ?Psychiatric: Negative for behavioral problem, confusion, depressed mood and sleep disturbance.   ? ?   ?Objective:  ?Objective  ? ?Vitals:  ? 02/23/22 1003  ?BP: 120/80  ?Weight: 172 lb (78 kg)  ?Height: _0  (1.702 m)  ? ?Body mass index is 26.94 kg/m?. ? ?Physical Exam ?Vitals and nursing note reviewed. Exam conducted with a chaperone present.  ?Constitutional:   ?   Appearance: Normal appearance.  ?HENT:  ?   Head: Normocephalic and atraumatic.  ?Eyes:  ?   Extraocular Movements: Extraocular movements intact.  ?   Pupils: Pupils are equal, round, and reactive to light.  ?Cardiovascular:  ?   Rate and Rhythm: Normal rate and regular rhythm.  ?Pulmonary:  ?   Effort: Pulmonary effort is normal.  ?   Breath sounds: Normal breath sounds.  ?Abdominal:  ?   General: Abdomen is flat.  ?   Palpations: Abdomen is soft.  ?Musculoskeletal:  ?   Cervical  back: Normal range of motion.  ?Skin: ?   General: Skin is warm and dry.  ?Neurological:  ?   General: No focal deficit present.  ?   Mental Status: She is alert and oriented to person, place, and time.  ?

## 2022-02-23 NOTE — Progress Notes (Signed)
?Perioperative Services ? ?Pre-Admission/Anesthesia Testing Clinical Review ? ?Date: 02/25/22 ? ?Patient Demographics:  ?Name: Nancy Blackburn ?DOB:   05-Jan-1946 ?MRN:   [id number] ? ?Planned Surgical Procedure(s):  ? ? Case: [id number] Date/Time: 03/02/22 0715  ? Procedure: XI ROBOTIC ASSISTED SALPINGO OOPHORECTOMY (Bilateral)  ? Anesthesia type: Choice  ? Pre-op diagnosis: left ovarian cyst  ? Location: [facility] OR ROOM 04 / [facility] ORS FOR ANESTHESIA GROUP  ? Surgeons: Homero Fellers, MD  ? ?NOTE: Available PAT nursing documentation and vital signs have been reviewed. Clinical nursing staff has updated patient's PMH/PSHx, current medication list, and drug allergies/intolerances to ensure comprehensive history available to assist in medical decision making as it pertains to the aforementioned surgical procedure and anticipated anesthetic course. Extensive review of available clinical information performed. [place] PMH and PSHx updated with any diagnoses/procedures that  may have been inadvertently omitted during her intake with the pre-admission testing department's nursing staff. ? ?Clinical Discussion:  ?Nancy Blackburn is a 76 y.o. female who is submitted for pre-surgical anesthesia review and clearance prior to her undergoing the above procedure. Patient has never been a smoker. Pertinent PMH includes: CAD, palpitations, diastolic dysfunction, pericardial effusion, bradycardia, HTN, HLD, asthma, GERD (no daily Tx), remote cervical cancer, adnexal cystic mass, resting essential tremors, Parkinsonian features, mild dementia, OA, depression, anxiety (on BZO), insomnia.  ? ?Patient is followed by cardiology Garen Lah, MD). She was last seen in the cardiology clinic on 02/18/2022; notes reviewed.  At the time of her clinic visit, patient doing well overall from a cardiovascular perspective.  She denied any episodes of chest pain, shortness breath, PND, orthopnea, palpitations, significant peripheral  edema, vertiginous symptoms, or presyncope/syncope.  Patient with past medical history significant for cardiovascular diagnoses. ? ?Most recent TTE performed on 09/28/2021 revealed a normal left ventricular systolic function with mild concentric LVH; LVEF 60-65%.  GLS -18.1%.  Diastolic Doppler parameters consistent with impaired relaxation (G1DD).  There was a small pericardial effusion noted.  There was mild mitral valve regurgitation.  There was no significant transvalvular gradient to suggest aortic or mitral valve regurgitation. ? ?Myocardial perfusion imaging study performed on 01/21/2022 revealed a normal left ventricular systolic function with an EF of >65%.  There were calcifications near the ostium of the RCA as well as aortic atherosclerosis noted.  Scan demonstrated a small pericardial effusion.  There was no evidence of stress-induced myocardial ischemia or arrhythmia.  Study determined to be normal and low risk. ? ?Blood pressure reasonably controlled at 130/64 on currently prescribed CCB and diuretic therapies.  Patient is currently on statin therapy for her HLD diagnosis and further ASCVD prevention.  She is not diabetic. Functional capacity, as defined by DASI, is documented as being >/= 4 METS.  No changes were made to her medication regimen.  Patient to follow-up with outpatient cardiology in 3 months or sooner if needed. ? ?IA LEEB found to have a large (8.5 cm) cystic mass in the LEFT hemipelvis on CT imaging of the abdomen and pelvis performed on 10/18/2021.  Most recent pelvic ultrasound revealed an 8.3 x 8.0 x 8.3 adnexal cyst (volume 290 cm?).  Patient is scheduled for a BILATERAL ROBOTIC ASSISTED SALPINGO OOPHORECTOMY on 03/02/2022 with Dr. Adrian Prows, MD.  Given patient's past medical history significant for cardiovascular and multiple comorbid diagnoses, presurgical clearances were sought from patient's primary care provider and primary cardiologist.  Specialty  clearances were obtained as follows. ? ?Per internal/family medicine, "patient is medically optimized for her surgery  and may proceed at an overall ACCEPTABLE risk of potential perioperative complications". ? ?Per cardiology, "Lexiscan performed in 01/2022 revealed no evidence of ischemia; low risk study.  Echocardiogram and 09/2021 revealed an EF of 60 to 65%.  Patient can proceed with planned surgical procedure from a cardiac perspective and an overall ACCEPTABLE risk".  In review of her medication reconciliation, it is noted the patient is not currently taking any type of anticoagulation or antiplatelet therapies that would need to be held during the perioperative course. ? ?Patient reports previous perioperative complications with anesthesia in the past. Patient has a PMH (+) for PONV.  Order placed by PAT for patient to receive a prophylactic preoperative dose of aprepitant. In review of the available records, it is noted that patient underwent a neuraxial anesthetic course here (ASA II ) in 02/2021 without documented complications.  ? ?Vitals with BMI 02/24/2022 02/23/2022 02/22/2022  ?Height - '5\' 7"'$  '5\' 7"'$   ?Weight 172 lbs 172 lbs 172 lbs  ?BMI 26.93 26.93 26.93  ?Systolic 782 956 -  ?Diastolic 69 80 -  ?Pulse 72 - -  ? ? ?Providers/Specialists:  ? ?NOTE: Primary physician provider listed below. Patient may have been seen by APP or partner within same practice.  ? ?PROVIDER ROLE / SPECIALTY LAST OV  ?Homero Fellers, MD OB/GYN (Surgeon) 02/23/2022  ?Jerrol Banana., MD Primary Care Provider 02/24/2022  ?Kate Sable, MD Cardiology 02/18/2022  ? ?Allergies:  ?Anti-inflammatory enzyme [nutritional supplements], Cymbalta [duloxetine hcl], Clarithromycin, Donepezil hcl, Nsaids, Ranitidine, and Venlafaxine ? ?Current Home Medications:  ? ?No current facility-administered medications for this encounter.  ? ? acetaminophen (TYLENOL) 500 MG tablet  ? albuterol (VENTOLIN HFA) 108 (90 Base) MCG/ACT  inhaler  ? amLODipine (NORVASC) 5 MG tablet  ? Budeson-Glycopyrrol-Formoterol (BREZTRI AEROSPHERE) 160-9-4.8 MCG/ACT AERO  ? Calcium Carb-Cholecalciferol (CALCIUM 600+D3) 600-800 MG-UNIT TABS  ? cholecalciferol (VITAMIN D) 25 MCG tablet  ? citalopram (CELEXA) 40 MG tablet  ? fexofenadine (ALLEGRA) 180 MG tablet  ? LORazepam (ATIVAN) 0.5 MG tablet  ? Menthol, Topical Analgesic, (BIOFREEZE EX)  ? primidone (MYSOLINE) 50 MG tablet  ? rosuvastatin (CRESTOR) 20 MG tablet  ? traZODone (DESYREL) 100 MG tablet  ? vitamin B-12 (CYANOCOBALAMIN) 1000 MCG tablet  ? busPIRone (BUSPAR) 15 MG tablet  ? hydrochlorothiazide (MICROZIDE) 12.5 MG capsule  ? ?History:  ? ?Past Medical History:  ?Diagnosis Date  ? Adnexal cyst 10/18/2021  ? a.) CT abdomen: large cystic mass in the LEFT hemipelvis measuring 8.5 cm. b.) Pelvis US 10/22/2021: adnexal cyst measuring 8.0 x 8.2 x 7.9 (volume 270 cm?). c.) Pelvis US 01/17/2022: cystic mass measured 8.2 x 8.0 x 8.3 (volume 290 cm?).  ? Anxiety   ? a.) on BZO (lorazepam) therapy PRN  ? Arthritis   ? Asthma   ? B12 deficiency   ? Benign essential tremor   ? Bleeding gastric ulcer   ? Bradycardia   ? Cervical cancer ([place]) 1978  ? Chronic sinus infection   ? Coronary artery disease   ? a.) CT chest 04/02/2021: coronary calcifications. b.) Lexi 01/21/2022: calcifications near [facility].  ? Depression   ? Diastolic dysfunction   ? a.) TTE 05/24/2012: EF 55-65%; triv MR, mild PR; G1DD. b.) TTE 05/24/2012: EF 55-65%; mild LVH; triv MR, mild TR/PR; G1DD. c.) TTE 04/15/2015: EF 60-65%; mild LVH; mild LA dilation; mild PR, triv TR; G2DD. d.) TTE 07/03/20 and 09/28/2021: EF 60-65%; mild LVH; G1DD.  ? Fracture, sacrum/coccyx ([place])   ? GERD (gastroesophageal  reflux disease)   ? Hiatal hernia   ? History of kidney stones   ? Hyperlipidemia   ? Hypertension   ? Insomnia   ? a.) takes trazodone PRN  ? Low back pain   ? Mild dementia   ? Nasal polyps   ? Normal nuclear stress test   ? a. 07/2005 Nl nuc stress test.  ?  Osteopenia   ? Osteoporosis   ? Palpitations   ? a. June 2013:  Event recorder normal sinus rhythm with rare PVCs - ? symptomatic PVC's.  ? Parkinsonian features   ? a.) resting tremors; LEFT hemibody Parkinsonism.

## 2022-02-23 NOTE — Progress Notes (Signed)
? ?Patient ID: Nancy Blackburn, female   DOB: Dec 19, 1945, 76 y.o.   MRN: [id number] ? ?Reason for Consult: Pre-op Exam ?  ?Referred by Jerrol Banana.,* ? ?Subjective:  ?   ?HPI: ? ?Nancy Blackburn is a 76 y.o. female she is here today for preoperative visit.  She has a left adnexal cyst which was measured 8 x 8.2 x 7.8 in size.  It has had a partial septation and questionable debris.  She has had some pelvic discomfort from this ovarian cyst.  She would like to have it removed.  She would also like to have her right ovary and bilateral fallopian tubes removed at the same time.  She has previously met with oncology who has felt like risk of malignancy is low and encouraged observation however the patient desires to proceed with surgery.  She has received clearance from her cardiologist.  She is following up with her primary care physician shortly.  She has plans for friends and family members to stay with her at home as she recovers after the surgery.   ? ?Gynecological History ? ?No LMP recorded. Patient has had a hysterectomy. ? ?Past Medical History:  ?Diagnosis Date  ? Adnexal cyst 10/18/2021  ? a.) CT abdomen: large cystic mass in the LEFT hemipelvis measuring 8.5 cm. b.) Pelvis US 10/22/2021: adnexal cyst measuring 8.0 x 8.2 x 7.9 (volume 270 cm?). c.) Pelvis US 01/17/2022: cystic mass measured 8.2 x 8.0 x 8.3 (volume 290 cm?).  ? Anxiety   ? a.) on BZO (lorazepam) therapy PRN  ? Arthritis   ? Asthma   ? B12 deficiency   ? Benign essential tremor   ? Bleeding gastric ulcer   ? Bradycardia   ? Cervical cancer ([place]) 1978  ? Chronic sinus infection   ? Coronary artery disease   ? a.) CT chest 04/02/2021: coronary calcifications. b.) Lexi 01/21/2022: calcifications near [facility].  ? Depression   ? Diastolic dysfunction   ? a.) TTE 05/24/2012: EF 55-65%; triv MR, mild PR; G1DD. b.) TTE 05/24/2012: EF 55-65%; mild LVH; triv MR, mild TR/PR; G1DD. c.) TTE 04/15/2015: EF 60-65%; mild LVH; mild LA dilation;  mild PR, triv TR; G2DD. d.) TTE 07/03/20 and 09/28/2021: EF 60-65%; mild LVH; G1DD.  ? Fracture, sacrum/coccyx ([place])   ? GERD (gastroesophageal reflux disease)   ? Hiatal hernia   ? History of kidney stones   ? Hyperlipidemia   ? Hypertension   ? Low back pain   ? Mild dementia   ? Nasal polyps   ? Normal nuclear stress test   ? a. 07/2005 Nl nuc stress test.  ? Osteopenia   ? Osteoporosis   ? Palpitations   ? a. June 2013:  Event recorder normal sinus rhythm with rare PVCs - ? symptomatic PVC's.  ? Parkinson disease ([place])   ? Pericardial effusion   ? a.) TTE 05/24/2012: small. b.) TTE 04/15/2015: trivial. c.) TTE 09/28/2021: small.  ? Pneumonia   ? PONV (postoperative nausea and vomiting)   ? Thyroid nodule   ? Vitamin D deficiency   ? ?Family History  ?Problem Relation Age of Onset  ? Colon cancer Mother   ? Heart murmur Mother   ? Heart disease Mother   ? Angina Mother   ? Heart disease Father   ? COPD Father   ? Atrial fibrillation Brother   ? Cushing syndrome Daughter   ? Cancer Daughter   ?     thyroid  ?  Breast cancer Cousin   ? Hypotension Neg Hx   ? Malignant hyperthermia Neg Hx   ? Pseudochol deficiency Neg Hx   ? ?Past Surgical History:  ?Procedure Laterality Date  ? BACK SURGERY  08/2010  ? BLADDER SUSPENSION  2003  ? bone spur  2009  ? removed from right collar bone area and partial collar bone removed  ? CATARACT EXTRACTION W/ INTRAOCULAR LENS  IMPLANT, BILATERAL    ? EYE SURGERY    ? LUMBAR FUSION  2011, 2012  ? SHOULDER ARTHROSCOPY Right 2012  ? THYROIDECTOMY, PARTIAL  1976  ? TONSILLECTOMY    ? as child  ? TOTAL KNEE ARTHROPLASTY Right 03/09/2021  ? Procedure: Right TOTAL KNEE ARTHROPLASTY;  Surgeon: Thornton Park, MD;  Location: [facility] ORS;  Service: Orthopedics;  Laterality: Right;  ? TUBAL LIGATION  1977  ? VAGINAL HYSTERECTOMY  1978  ? d/t cervical cancer  ? ? ?Short Social History:  ?Social History  ? ?Tobacco Use  ? Smoking status: Never  ? Smokeless tobacco: Never  ?Substance Use Topics  ?  Alcohol use: Yes  ?  Comment: 1 glass of wine or mixed drink every 2-3 months  ? ? ?Allergies  ?Allergen Reactions  ? Anti-Inflammatory Enzyme [Nutritional Supplements] Other (See Comments)  ?  REACTION: Due to bleeding ulcer  ? Cymbalta [Duloxetine Hcl] Other (See Comments)  ?  REACTION: Urinary retention  ? Clarithromycin Other (See Comments)  ?  Upsets stomach  ? Donepezil Hcl Other (See Comments)  ?  Significant bradycardia causing syncope  ? Nsaids   ?  GI BLEED  ? Ranitidine Diarrhea  ?  severe diarrhea  ? Venlafaxine   ?  severe depression  ? ? ?Current Outpatient Medications  ?Medication Sig Dispense Refill  ? acetaminophen (TYLENOL) 500 MG tablet Take 500 mg by mouth every 6 (six) hours as needed for moderate pain.    ? albuterol (VENTOLIN HFA) 108 (90 Base) MCG/ACT inhaler Inhale 1-2 puffs into the lungs every 6 (six) hours as needed for wheezing or shortness of breath. 8 g 2  ? amLODipine (NORVASC) 5 MG tablet Take 1 tablet (5 mg total) by mouth daily.    ? Budeson-Glycopyrrol-Formoterol (BREZTRI AEROSPHERE) 160-9-4.8 MCG/ACT AERO Inhale 2 puffs into the lungs in the morning and at bedtime. Through [place] Patient Assistance 32.1 g 3  ? busPIRone (BUSPAR) 15 MG tablet Take 15 mg by mouth 2 (two) times daily.    ? Calcium Carb-Cholecalciferol (CALCIUM 600+D3) 600-800 MG-UNIT TABS Take 1 tablet by mouth daily.    ? cholecalciferol (VITAMIN D) 25 MCG tablet Take 1 tablet (1,000 Units total) by mouth daily. 30 tablet 0  ? citalopram (CELEXA) 40 MG tablet Take 40 mg by mouth daily.    ? fexofenadine (ALLEGRA) 180 MG tablet Take 180 mg by mouth at bedtime.    ? hydrochlorothiazide (HYDRODIURIL) 12.5 MG tablet Take 1 tablet (12.5 mg total) by mouth every morning. 90 tablet 1  ? LORazepam (ATIVAN) 0.5 MG tablet TAKE 1 TABLET BY MOUTH 3 TIMES DAILY AS NEEDED FOR ANXIETY 90 tablet 1  ? Menthol, Topical Analgesic, (BIOFREEZE EX) Apply 1 application. topically daily as needed (pain).    ? primidone (MYSOLINE) 50 MG  tablet Take 50 mg by mouth at bedtime.    ? rosuvastatin (CRESTOR) 20 MG tablet TAKE 1 TABLET BY MOUTH ONCE A DAY (Patient taking differently: Take 20 mg by mouth at bedtime.) 30 tablet 0  ? traZODone (DESYREL) 100 MG tablet  Take 300 mg by mouth at bedtime.    ? vitamin B-12 (CYANOCOBALAMIN) 1000 MCG tablet Take 1,000 mcg by mouth daily.    ? ?No current facility-administered medications for this visit.  ? ? ?Review of Systems  ?Constitutional: Negative for chills, fatigue, fever and unexpected weight change.  ?HENT: Negative for trouble swallowing.  ?Eyes: Negative for loss of vision.  ?Respiratory: Negative for cough, shortness of breath and wheezing.  ?Cardiovascular: Negative for chest pain, leg swelling, palpitations and syncope.  ?GI: Negative for abdominal pain, blood in stool, diarrhea, nausea and vomiting.  ?GU: Negative for difficulty urinating, dysuria, frequency and hematuria.  ?Musculoskeletal: Negative for back pain, leg pain and joint pain.  ?Skin: Negative for rash.  ?Neurological: Negative for dizziness, headaches, light-headedness, numbness and seizures.  ?Psychiatric: Negative for behavioral problem, confusion, depressed mood and sleep disturbance.   ? ?   ?Objective:  ?Objective  ? ?Vitals:  ? 02/23/22 1003  ?BP: 120/80  ?Weight: 172 lb (78 kg)  ?Height: _0  (1.702 m)  ? ?Body mass index is 26.94 kg/m?. ? ?Physical Exam ?Vitals and nursing note reviewed. Exam conducted with a chaperone present.  ?Constitutional:   ?   Appearance: Normal appearance.  ?HENT:  ?   Head: Normocephalic and atraumatic.  ?Eyes:  ?   Extraocular Movements: Extraocular movements intact.  ?   Pupils: Pupils are equal, round, and reactive to light.  ?Cardiovascular:  ?   Rate and Rhythm: Normal rate and regular rhythm.  ?Pulmonary:  ?   Effort: Pulmonary effort is normal.  ?   Breath sounds: Normal breath sounds.  ?Abdominal:  ?   General: Abdomen is flat.  ?   Palpations: Abdomen is soft.  ?Musculoskeletal:  ?   Cervical  back: Normal range of motion.  ?Skin: ?   General: Skin is warm and dry.  ?Neurological:  ?   General: No focal deficit present.  ?   Mental Status: She is alert and oriented to person, place, and time.  ?

## 2022-02-23 NOTE — Telephone Encounter (Signed)
Requested medication (s) are due for refill today:no ? ?Requested medication (s) are on the active medication list: yes   ? ?Last refill: 11/10/21  #90  1 refill ? ?Future visit scheduled yes  ? ?Notes to clinic:Pt has appt tomorrow. Please review. Thank you. ? ?Requested Prescriptions  ?Pending Prescriptions Disp Refills  ? hydrochlorothiazide (MICROZIDE) 12.5 MG capsule [Pharmacy Med Name: HYDROCHLOROTHIAZIDE 12.5 MG CAP] 90 capsule   ?  Sig: TAKE 1 CAPSULE BY MOUTH EVERY MORNING  ?  ? Cardiovascular: Diuretics - Thiazide Failed - 02/23/2022 12:00 PM  ?  ?  Failed - Cr in normal range and within 180 days  ?  Creat  ?Date Value Ref Range Status  ?11/23/2017 1.02 (H) 0.60 - 0.93 mg/dL Final  ?  Comment:  ?  For patients >37 years of age, the reference limit ?for Creatinine is approximately 13% higher for people ?identified as African-American. ?. ?  ? ?Creatinine, Ser  ?Date Value Ref Range Status  ?02/23/2022 0.97 0.44 - 1.00 mg/dL Final  ?  ?  ?  ?  Passed - K in normal range and within 180 days  ?  Potassium  ?Date Value Ref Range Status  ?02/23/2022 3.3 (L) 3.5 - 5.1 mmol/L Final  ?10/28/2014 4.1 3.5 - 5.1 mmol/L Final  ?  ?  ?  ?  Passed - Na in normal range and within 180 days  ?  Sodium  ?Date Value Ref Range Status  ?02/23/2022 140 135 - 145 mmol/L Final  ?01/14/2022 140 134 - 144 mmol/L Final  ?10/28/2014 136 136 - 145 mmol/L Final  ?  ?  ?  ?  Passed - Last BP in normal range  ?  BP Readings from Last 1 Encounters:  ?02/23/22 120/80  ?  ?  ?  ?  Passed - Valid encounter within last 6 months  ?  Recent Outpatient Visits   ? ?      ? 3 months ago Sepsis, due to unspecified organism, unspecified whether acute organ dysfunction present [facility])  ? [facility] Jerrol Banana., MD  ? 6 months ago Chest pain, unspecified type  ? [facility] Jerrol Banana., MD  ? 7 months ago Mild intermittent asthma with acute exacerbation  ? [facility] Jerrol Banana., MD  ? 9 months ago COVID-19  ? [facility] Jerrol Banana., MD  ? 10 months ago Shortness of breath  ? [facility] Jerrol Banana., MD  ? ?  ?  ?Future Appointments   ? ?        ? Tomorrow Jerrol Banana., MD [facility]  ? In 2 weeks Jerrol Banana., MD [facility]  ? ?  ? ?  ?  ?  ? ? ? ? ?

## 2022-02-24 ENCOUNTER — Encounter: Payer: Self-pay | Admitting: Obstetrics and Gynecology

## 2022-02-24 ENCOUNTER — Other Ambulatory Visit: Payer: Self-pay

## 2022-02-24 ENCOUNTER — Ambulatory Visit (INDEPENDENT_AMBULATORY_CARE_PROVIDER_SITE_OTHER): Payer: Medicare Other | Admitting: Family Medicine

## 2022-02-24 ENCOUNTER — Encounter: Payer: Self-pay | Admitting: Family Medicine

## 2022-02-24 VITALS — BP 123/69 | HR 72 | Temp 98.1°F | Resp 16 | Wt 172.0 lb

## 2022-02-24 DIAGNOSIS — F3341 Major depressive disorder, recurrent, in partial remission: Secondary | ICD-10-CM

## 2022-02-24 DIAGNOSIS — I2510 Atherosclerotic heart disease of native coronary artery without angina pectoris: Secondary | ICD-10-CM

## 2022-02-24 DIAGNOSIS — I2584 Coronary atherosclerosis due to calcified coronary lesion: Secondary | ICD-10-CM

## 2022-02-24 DIAGNOSIS — R259 Unspecified abnormal involuntary movements: Secondary | ICD-10-CM | POA: Diagnosis not present

## 2022-02-24 DIAGNOSIS — N83202 Unspecified ovarian cyst, left side: Secondary | ICD-10-CM

## 2022-02-24 NOTE — Progress Notes (Signed)
?  ? ? ?Established patient visit ? ?I,[name],acting as a scribe for Wilhemena Durie, MD.,have documented all relevant documentation on the behalf of Wilhemena Durie, MD,as directed by  Wilhemena Durie, MD while in the presence of Wilhemena Durie, MD. ? ? ?[name]   DOB: 09/14/1946   76 y.o. Female  MRN: [id number] ?Visit Date: 02/24/2022 ? ?Today's healthcare provider: Wilhemena Durie, MD  ? ?Chief Complaint  ?Patient presents with  ? Pre-op Exam  ? ?Subjective  ?  ?HPI  ?Patient is here for pre-op exam. Patient is having left ovarian cyst procedure, planning xi robot assisted laparoscopic bilateral salpingo-oophorectomy on 03/02/2022. ?She has been cleared by cardiology. ?Physically she is ready to have her surgery. ?Daughter just moved into [facility] for rehab from multiple medical issues ?Medications: ?Outpatient Medications Prior to Visit  ?Medication Sig  ? acetaminophen (TYLENOL) 500 MG tablet Take 500 mg by mouth every 6 (six) hours as needed for moderate pain.  ? albuterol (VENTOLIN HFA) 108 (90 Base) MCG/ACT inhaler Inhale 1-2 puffs into the lungs every 6 (six) hours as needed for wheezing or shortness of breath.  ? amLODipine (NORVASC) 5 MG tablet Take 1 tablet (5 mg total) by mouth daily.  ? Budeson-Glycopyrrol-Formoterol (BREZTRI AEROSPHERE) 160-9-4.8 MCG/ACT AERO Inhale 2 puffs into the lungs in the morning and at bedtime. Through [place] Patient Assistance  ? busPIRone (BUSPAR) 15 MG tablet Take 15 mg by mouth 2 (two) times daily.  ? Calcium Carb-Cholecalciferol (CALCIUM 600+D3) 600-800 MG-UNIT TABS Take 1 tablet by mouth daily.  ? cholecalciferol (VITAMIN D) 25 MCG tablet Take 1 tablet (1,000 Units total) by mouth daily.  ? citalopram (CELEXA) 40 MG tablet Take 40 mg by mouth daily.  ? fexofenadine (ALLEGRA) 180 MG tablet Take 180 mg by mouth at bedtime.  ? hydrochlorothiazide (HYDRODIURIL) 12.5 MG tablet Take 1 tablet (12.5 mg total) by mouth every  morning.  ? LORazepam (ATIVAN) 0.5 MG tablet TAKE 1 TABLET BY MOUTH 3 TIMES DAILY AS NEEDED FOR ANXIETY  ? Menthol, Topical Analgesic, (BIOFREEZE EX) Apply 1 application. topically daily as needed (pain).  ? primidone (MYSOLINE) 50 MG tablet Take 50 mg by mouth at bedtime.  ? rosuvastatin (CRESTOR) 20 MG tablet TAKE 1 TABLET BY MOUTH ONCE A DAY (Patient taking differently: Take 20 mg by mouth at bedtime.)  ? traZODone (DESYREL) 100 MG tablet Take 300 mg by mouth at bedtime.  ? vitamin B-12 (CYANOCOBALAMIN) 1000 MCG tablet Take 1,000 mcg by mouth daily.  ? ?No facility-administered medications prior to visit.  ? ? ?Review of Systems  ?Constitutional:  Negative for appetite change, chills, fatigue and fever.  ?Respiratory:  Negative for chest tightness and shortness of breath.   ?Cardiovascular:  Negative for chest pain and palpitations.  ?Gastrointestinal:  Negative for abdominal pain, nausea and vomiting.  ?Neurological:  Negative for dizziness and weakness.  ? ?  ?  Objective  ?  ?BP 123/69 (BP Location: Right Arm, Patient Position: Sitting, Cuff Size: Normal)   Pulse 72   Temp 98.1 ?F (36.7 ?C) (Temporal)   Resp 16   Wt 172 lb (78 kg)   SpO2 96%   BMI 26.94 kg/m?  ?BP Readings from Last 3 Encounters:  ?02/24/22 123/69  ?02/23/22 120/80  ?02/18/22 130/64  ? ?Wt Readings from Last 3 Encounters:  ?02/24/22 172 lb (78 kg)  ?02/23/22 172 lb (78 kg)  ?02/22/22 172 lb (78 kg)  ? ?  ? ?  Physical Exam ?Vitals reviewed.  ?Constitutional:   ?   Appearance: Normal appearance. She is well-developed.  ?HENT:  ?   Head: Normocephalic and atraumatic.  ?   Right Ear: External ear normal.  ?   Left Ear: External ear normal.  ?   Nose: Nose normal.  ?Eyes:  ?   General: No scleral icterus. ?   Conjunctiva/sclera: Conjunctivae normal.  ?Neck:  ?   Thyroid: No thyromegaly.  ?Cardiovascular:  ?   Rate and Rhythm: Normal rate and regular rhythm.  ?   Heart sounds: Normal heart sounds.  ?Pulmonary:  ?   Effort: Pulmonary effort is  normal.  ?   Breath sounds: Normal breath sounds.  ?Chest:  ?Breasts: ?   Breasts are symmetrical.  ?   Right: Normal.  ?   Left: Normal.  ?Abdominal:  ?   Palpations: Abdomen is soft.  ?Skin: ?   General: Skin is warm and dry.  ?Neurological:  ?   Mental Status: She is alert and oriented to person, place, and time. Mental status is at baseline.  ?Psychiatric:     ?   Mood and Affect: Mood normal.     ?   Behavior: Behavior normal.     ?   Thought Content: Thought content normal.     ?   Judgment: Judgment normal.  ?  ? ? ?No results found for any visits on 02/24/22. ? Assessment & Plan  ?  ? ?1. Cyst of left ovary ?Patient for surgery next week.  She is medically cleared.  She has been cleared by cardiology ? ?2. Parkinsonian features ? ? ?3. Depression, major, recurrent, in partial remission ([place]) ?In partial remission.  Doing well ? ? ?No follow-ups on file.  ?   ? ?I, Wilhemena Durie, MD, have reviewed all documentation for this visit. The documentation on 02/26/22 for the exam, diagnosis, procedures, and orders are all accurate and complete. ? ? ? ?Donell Tomkins Cranford Mon, MD  ?[facility] ?[phone] (phone) ?[phone] (fax) ? ?[facility] ?

## 2022-02-28 ENCOUNTER — Telehealth: Payer: Self-pay

## 2022-02-28 DIAGNOSIS — E785 Hyperlipidemia, unspecified: Secondary | ICD-10-CM

## 2022-02-28 MED ORDER — ROSUVASTATIN CALCIUM 40 MG PO TABS: 40.0000 mg | ORAL_TABLET | Freq: Every day | ORAL | 1 refills | Status: DC

## 2022-02-28 NOTE — Telephone Encounter (Signed)
-----   Message from Kate Sable, MD sent at 02/25/2022  5:10 PM EDT ----- ?Triglycerides still elevated.  Increase Crestor to 40 mg daily. ?

## 2022-02-28 NOTE — Telephone Encounter (Signed)
Called patient and reviewed Lipid result note as documented below. Patient verbalized understanding and agreed with plan. Prescription sent into [facility] as requested. ?

## 2022-02-28 NOTE — Telephone Encounter (Signed)
Medical clearance rec'd via fax from Dr Rosanna Randy. ? ?Cardiology clearance given in my chart from visit on 02/18/22. ?

## 2022-03-01 NOTE — Anesthesia Preprocedure Evaluation (Addendum)
Anesthesia Evaluation  ?Patient identified by MRN, date of birth, ID band ?Patient awake ? ? ? ?Reviewed: ?Allergy & Precautions, NPO status , Patient's Chart, lab work & pertinent test results ? ?History of Anesthesia Complications ?(+) PONV and history of anesthetic complications ? ?Airway ?Mallampati: II ? ?TM Distance: >3 FB ?Neck ROM: Full ? ? ? Dental ?no notable dental hx. ?(+) Teeth Intact ?  ?Pulmonary ?shortness of breath and with exertion, asthma , neg sleep apnea, neg COPD, Patient abstained from smoking.Not current smoker,  ?Well controlled asthma, takes daily meds (not taken today). Uses rescue inhaler maybe once every three months. ?  ?Pulmonary exam normal ?breath sounds clear to auscultation ? ? ? ? ? ? Cardiovascular ?Exercise Tolerance: Good ?METShypertension, + CAD  ?(-) Past MI (-) dysrhythmias  ?Rhythm:Regular Rate:Normal ?- Systolic murmurs ?MYOCARDIAL PERFUSION IMAGING STUDY ([facility]) performed on 01/21/2022 ?1. LVEF >65% ?2. Calcification near the ostium of the RCA as well as aortic atherosclerosis noted ?3. Small pericardial effusion ?4. No evidence of stress-induced myocardial ischemia or arrhythmia; no scar ?5. Low risk study ?? ?TRANSTHORACIC ECHOCARDIOGRAM performed on 09/28/2021 ?1. Left ventricular ejection fraction, by estimation, is 60 to 65%. The left ventricle has normal function. The left ventricle has no regional wall motion abnormalities. There is mild concentric left ventricular hypertrophy with moderate thickening of the basal-septal segment. Left ventricular diastolic parameters are consistent with Grade I diastolic dysfunction (impaired relaxation). No significant LVOT gradient measured.  ?2. Right ventricular systolic function is normal. The right ventricular size is normal. There is normal pulmonary artery systolic pressure. The estimated right ventricular systolic pressure is 85.0 mmHg.  ?3. A small pericardial effusion is present.   ?4. The mitral valve is normal in structure. Mild mitral valve regurgitation. No evidence of mitral stenosis.  ?5. The aortic valve is normal in structure. Aortic valve regurgitation is not visualized. Mild aortic valve sclerosis is present, with no evidence of aortic valve stenosis. ?6. The inferior vena cava is normal in size with greater than 50% respiratory variability, suggesting right atrial pressure of 3 mmHg.  ?  ?Neuro/Psych ?PSYCHIATRIC DISORDERS Anxiety Depression Dementia Parkinson's. AOx3negative neurological ROS ?   ? GI/Hepatic ?PUD, GERD  Controlled,(+)  ?  ? (-) substance abuse ? ,   ?Endo/Other  ?neg diabetes ? Renal/GU ?negative Renal ROS  ? ?  ?Musculoskeletal ? ? Abdominal ?  ?Peds ? Hematology ?  ?Anesthesia Other Findings ?Past Medical History: ?10/18/2021: Adnexal cyst ?    Comment:  a.) CT abdomen: large cystic mass in the LEFT hemipelvis ?             measuring 8.5 cm. b.) Pelvis US 10/22/2021: adnexal cyst  ?             measuring 8.0 x 8.2 x 7.9 (volume 270 cm?). c.) Pelvis US ?             01/17/2022: cystic mass measured 8.2 x 8.0 x 8.3 (volume  ?             290 cm?). ?No date: Anxiety ?    Comment:  a.) on BZO (lorazepam) therapy PRN ?No date: Arthritis ?No date: Asthma ?No date: B12 deficiency ?No date: Benign essential tremor ?No date: Bleeding gastric ulcer ?No date: Bradycardia ?1978: Cervical cancer ([place]) ?No date: Chronic sinus infection ?No date: Coronary artery disease ?    Comment:  a.) CT chest 04/02/2021: coronary calcifications. b.)  ?  [place] 01/21/2022: calcifications near [facility]. ?No date: Depression ?No date: Diastolic dysfunction ?    Comment:  a.) TTE 05/24/2012: EF 55-65%; triv MR, mild PR; G1DD.  ?             b.) TTE 05/24/2012: EF 55-65%; mild LVH; triv MR, mild  ?             TR/PR; G1DD. c.) TTE 04/15/2015: EF 60-65%; mild LVH;  ?             mild LA dilation; mild PR, triv TR; G2DD. d.) TTE  ?             07/03/20 and 09/28/2021: EF 60-65%; mild LVH;  G1DD. ?No date: Fracture, sacrum/coccyx ([place]) ?No date: GERD (gastroesophageal reflux disease) ?No date: Hiatal hernia ?No date: History of kidney stones ?No date: Hyperlipidemia ?No date: Hypertension ?No date: Insomnia ?    Comment:  a.) takes trazodone PRN ?No date: Low back pain ?No date: Mild dementia ?No date: Nasal polyps ?No date: Normal nuclear stress test ?    Comment:  a. 07/2005 Nl nuc stress test. ?No date: Osteopenia ?No date: Osteoporosis ?No date: Palpitations ?    Comment:  a. June 2013:  Event recorder normal sinus rhythm with  ?             rare PVCs - ? symptomatic PVC's. ?No date: Parkinsonian features ?    Comment:  a.) resting tremors; LEFT hemibody Parkinsonism. b.)  ?             takes primidone ?No date: Pericardial effusion ?    Comment:  a.) TTE 05/24/2012: small. b.) TTE 04/15/2015: trivial.  ?             c.) TTE 09/28/2021: small. ?No date: Pneumonia ?No date: PONV (postoperative nausea and vomiting) ?No date: Thyroid nodule ?No date: Vitamin D deficiency ? Reproductive/Obstetrics ? ?  ? ? ? ? ? ? ? ? ? ? ? ? ? ?  ?  ? ? ? ? ? ? ?                                  Anesthesia Evaluation  ?Patient identified by MRN, date of birth, ID band ?Patient awake ? ? ? ?Reviewed: ?Allergy & Precautions, H&P , NPO status , Patient's Chart, lab work & pertinent test results ? ?History of Anesthesia Complications ?(+) PONVNegative for: history of anesthetic complications ? ?Airway ?Mallampati: II ? ?TM Distance: >3 FB ? ? ? ? Dental ?  ?Pulmonary ?shortness of breath, asthma , neg sleep apnea, neg COPD,  ?  ?breath sounds clear to auscultation ? ? ? ? ? ? Cardiovascular ?hypertension, (-) angina(-) Past MI and (-) Cardiac Stents (-) dysrhythmias  ?Rhythm:regular Rate:Normal ? ? ?  ?Neuro/Psych ?PSYCHIATRIC DISORDERS Anxiety Depression H/o L4-5 lumbar fusion ?negative neurological ROS ?   ? GI/Hepatic ?Neg liver ROS, PUD, GERD  Controlled,  ?Endo/Other  ?negative endocrine ROS ? Renal/GU ?  ? ?   ?Musculoskeletal ? ? Abdominal ?  ?Peds ? Hematology ?negative hematology ROS ?(+)   ?Anesthesia Other Findings ?Past Medical History: ?No date: Asthma ?No date: Bradycardia ?1978: Cervical cancer ([place]) ?No date: Chronic sinus infection ?No date: Ejection fraction ?    Comment:  a. EF 55-65%, echo, mild LVH, June, 2013, small  ?  pericardial effusion ?No date: Fracture, sacrum/coccyx ([place]) ?No date: History of kidney stones ?No date: Hyperlipidemia ?    Comment:  a. takes Simvastatin ?No date: Hypertension ?    Comment:  a. takes Metoprolol, Lisinopril, and amlodipine. ?No date: Low back pain ?No date: Mild dementia ([place]) ?No date: Nasal polyps ?No date: Normal nuclear stress test ?    Comment:  a. 07/2005 Nl nuc stress test. ?No date: Osteopenia ?No date: Osteoporosis ?No date: Palpitations ?    Comment:  a. June 2013:  Event recorder normal sinus rhythm with  ?             rare PVCs - ? symptomatic PVC's. ?No date: Parkinson disease ([place]) ?No date: Pericardial effusion ?    Comment:  a. Small, echo, June, 2013 ?No date: PONV (postoperative nausea and vomiting) ? ?Past Surgical History: ?08/2010: BACK SURGERY ?2003: BLADDER SUSPENSION ?2009: bone spur ?    Comment:  removed from right collar bone area and partial collar  ?             bone removed ?No date: CATARACT EXTRACTION W/ INTRAOCULAR LENS  IMPLANT, BILATERAL ?No date: EYE SURGERY ?2011, 2012: LUMBAR FUSION ?2012: SHOULDER ARTHROSCOPY; Right ?1976: THYROIDECTOMY, PARTIAL ?No date: TONSILLECTOMY ?    Comment:  as child ?1977: TUBAL LIGATION ?1978: VAGINAL HYSTERECTOMY ?    Comment:  d/t cervical cancer ? ?BMI   ? Body Mass Index: 28.35 kg/m?  ?  ? ? Reproductive/Obstetrics ?negative OB ROS ? ?  ? ? ? ? ? ? ? ? ? ? ? ? ? ?  ?  ? ? ? ? ? ? ? ?Anesthesia Physical ?Anesthesia Plan ? ?ASA: II ? ?Anesthesia Plan: Spinal  ? ?Post-op Pain Management:   ? ?Induction:  ? ?PONV Risk Score and Plan: Propofol infusion and Ondansetron ? ?Airway Management  Planned: Simple Face Mask ? ?Additional Equipment:  ? ?Intra-op Plan:  ? ?Post-operative Plan:  ? ?Informed Consent: I have reviewed the patients History and Physical, chart, labs and discussed the procedure including th

## 2022-03-02 ENCOUNTER — Ambulatory Visit: Payer: Medicare Other | Admitting: Urgent Care

## 2022-03-02 ENCOUNTER — Observation Stay
Admission: RE | Admit: 2022-03-02 | Discharge: 2022-03-03 | Disposition: A | Discharge status: Home / Self Care | Payer: Medicare Other | Attending: Obstetrics and Gynecology | Admitting: Obstetrics and Gynecology

## 2022-03-02 ENCOUNTER — Other Ambulatory Visit: Payer: Self-pay

## 2022-03-02 ENCOUNTER — Encounter
Admission: RE | Disposition: A | Discharge status: Home / Self Care | Payer: Self-pay | Source: Home / Self Care | Attending: Obstetrics and Gynecology

## 2022-03-02 ENCOUNTER — Other Ambulatory Visit: Payer: Medicare Other

## 2022-03-02 ENCOUNTER — Encounter: Payer: Self-pay | Admitting: Obstetrics and Gynecology

## 2022-03-02 DIAGNOSIS — D509 Iron deficiency anemia, unspecified: Secondary | ICD-10-CM | POA: Diagnosis not present

## 2022-03-02 DIAGNOSIS — Z90710 Acquired absence of both cervix and uterus: Secondary | ICD-10-CM | POA: Insufficient documentation

## 2022-03-02 DIAGNOSIS — Z8541 Personal history of malignant neoplasm of cervix uteri: Secondary | ICD-10-CM | POA: Diagnosis not present

## 2022-03-02 DIAGNOSIS — R001 Bradycardia, unspecified: Secondary | ICD-10-CM | POA: Insufficient documentation

## 2022-03-02 DIAGNOSIS — N83202 Unspecified ovarian cyst, left side: Secondary | ICD-10-CM | POA: Diagnosis not present

## 2022-03-02 DIAGNOSIS — Z8249 Family history of ischemic heart disease and other diseases of the circulatory system: Secondary | ICD-10-CM | POA: Insufficient documentation

## 2022-03-02 DIAGNOSIS — D271 Benign neoplasm of left ovary: Secondary | ICD-10-CM | POA: Diagnosis not present

## 2022-03-02 DIAGNOSIS — J45909 Unspecified asthma, uncomplicated: Secondary | ICD-10-CM | POA: Insufficient documentation

## 2022-03-02 DIAGNOSIS — F03A0 Unspecified dementia, mild, without behavioral disturbance, psychotic disturbance, mood disturbance, and anxiety: Secondary | ICD-10-CM | POA: Diagnosis not present

## 2022-03-02 DIAGNOSIS — F32A Depression, unspecified: Secondary | ICD-10-CM | POA: Insufficient documentation

## 2022-03-02 DIAGNOSIS — N83201 Unspecified ovarian cyst, right side: Secondary | ICD-10-CM | POA: Diagnosis not present

## 2022-03-02 DIAGNOSIS — F419 Anxiety disorder, unspecified: Secondary | ICD-10-CM | POA: Diagnosis not present

## 2022-03-02 DIAGNOSIS — I10 Essential (primary) hypertension: Secondary | ICD-10-CM | POA: Insufficient documentation

## 2022-03-02 DIAGNOSIS — Z79899 Other long term (current) drug therapy: Secondary | ICD-10-CM | POA: Insufficient documentation

## 2022-03-02 DIAGNOSIS — Z96651 Presence of right artificial knee joint: Secondary | ICD-10-CM | POA: Insufficient documentation

## 2022-03-02 DIAGNOSIS — Z8719 Personal history of other diseases of the digestive system: Secondary | ICD-10-CM | POA: Insufficient documentation

## 2022-03-02 DIAGNOSIS — I119 Hypertensive heart disease without heart failure: Secondary | ICD-10-CM | POA: Diagnosis not present

## 2022-03-02 DIAGNOSIS — M1990 Unspecified osteoarthritis, unspecified site: Secondary | ICD-10-CM | POA: Diagnosis not present

## 2022-03-02 DIAGNOSIS — I3139 Other pericardial effusion (noninflammatory): Secondary | ICD-10-CM | POA: Diagnosis not present

## 2022-03-02 DIAGNOSIS — I2510 Atherosclerotic heart disease of native coronary artery without angina pectoris: Secondary | ICD-10-CM | POA: Diagnosis not present

## 2022-03-02 DIAGNOSIS — G4700 Insomnia, unspecified: Secondary | ICD-10-CM | POA: Diagnosis not present

## 2022-03-02 DIAGNOSIS — E785 Hyperlipidemia, unspecified: Secondary | ICD-10-CM | POA: Insufficient documentation

## 2022-03-02 DIAGNOSIS — G20 Parkinson's disease: Secondary | ICD-10-CM | POA: Insufficient documentation

## 2022-03-02 DIAGNOSIS — G250 Essential tremor: Secondary | ICD-10-CM | POA: Diagnosis not present

## 2022-03-02 DIAGNOSIS — R002 Palpitations: Secondary | ICD-10-CM | POA: Insufficient documentation

## 2022-03-02 DIAGNOSIS — F0390 Unspecified dementia without behavioral disturbance: Secondary | ICD-10-CM | POA: Insufficient documentation

## 2022-03-02 DIAGNOSIS — K219 Gastro-esophageal reflux disease without esophagitis: Secondary | ICD-10-CM | POA: Insufficient documentation

## 2022-03-02 DIAGNOSIS — N83291 Other ovarian cyst, right side: Secondary | ICD-10-CM | POA: Diagnosis not present

## 2022-03-02 HISTORY — DX: Diaphragmatic hernia without obstruction or gangrene: K44.9

## 2022-03-02 HISTORY — PX: ROBOTIC ASSISTED SALPINGO OOPHERECTOMY: SHX6082

## 2022-03-02 HISTORY — DX: Gastro-esophageal reflux disease without esophagitis: K21.9

## 2022-03-02 HISTORY — DX: Anxiety disorder, unspecified: F41.9

## 2022-03-02 HISTORY — DX: Depression, unspecified: F32.A

## 2022-03-02 HISTORY — DX: Vitamin D deficiency, unspecified: E55.9

## 2022-03-02 HISTORY — DX: Other ill-defined heart diseases: I51.89

## 2022-03-02 HISTORY — DX: Deficiency of other specified B group vitamins: E53.8

## 2022-03-02 HISTORY — DX: Other symptoms and signs involving the nervous system: R29.818

## 2022-03-02 HISTORY — DX: Unspecified abnormal involuntary movements: R25.9

## 2022-03-02 HISTORY — DX: Insomnia, unspecified: G47.00

## 2022-03-02 HISTORY — DX: Essential tremor: G25.0

## 2022-03-02 SURGERY — SALPINGO-OOPHORECTOMY, ROBOT-ASSISTED
Anesthesia: General | Laterality: Bilateral

## 2022-03-02 MED ORDER — HYDROMORPHONE HCL 1 MG/ML IJ SOLN
INTRAMUSCULAR | Status: AC
Start: 2022-03-02 — End: ?
  Filled 2022-03-02: qty 1

## 2022-03-02 MED ORDER — ROCURONIUM BROMIDE 100 MG/10ML IV SOLN
INTRAVENOUS | Status: DC | PRN
Start: 2022-03-02 — End: 2022-03-02
  Administered 2022-03-02: 10 mg via INTRAVENOUS
  Administered 2022-03-02: 40 mg via INTRAVENOUS
  Administered 2022-03-02: 20 mg via INTRAVENOUS

## 2022-03-02 MED ORDER — HYDROMORPHONE HCL 1 MG/ML IJ SOLN
INTRAMUSCULAR | Status: DC | PRN
  Administered 2022-03-02: 1 mg via INTRAVENOUS

## 2022-03-02 MED ORDER — SIMETHICONE 80 MG PO CHEW
80.0000 mg | CHEWABLE_TABLET | Freq: Four times a day (QID) | ORAL | Status: DC
  Administered 2022-03-02 – 2022-03-03 (×4): 80 mg via ORAL
  Filled 2022-03-02 (×4): qty 1

## 2022-03-02 MED ORDER — MOMETASONE FURO-FORMOTEROL FUM 100-5 MCG/ACT IN AERO
2.0000 | INHALATION_SPRAY | Freq: Two times a day (BID) | RESPIRATORY_TRACT | Status: DC
  Administered 2022-03-02 – 2022-03-03 (×3): 2 via RESPIRATORY_TRACT
  Filled 2022-03-02: qty 8.8

## 2022-03-02 MED ORDER — LIDOCAINE HCL (CARDIAC) PF 100 MG/5ML IV SOSY
PREFILLED_SYRINGE | INTRAVENOUS | Status: DC | PRN
  Administered 2022-03-02: 80 mg via INTRAVENOUS

## 2022-03-02 MED ORDER — ROSUVASTATIN CALCIUM 20 MG PO TABS
40.0000 mg | ORAL_TABLET | Freq: Every day | ORAL | Status: DC
  Administered 2022-03-02: 40 mg via ORAL
  Filled 2022-03-02: qty 2

## 2022-03-02 MED ORDER — CHLORHEXIDINE GLUCONATE 0.12 % MT SOLN: 15.0000 mL | Freq: Once | OROMUCOSAL | Status: AC

## 2022-03-02 MED ORDER — BUPIVACAINE LIPOSOME 1.3 % IJ SUSP
INTRAMUSCULAR | Status: AC
  Filled 2022-03-02: qty 20

## 2022-03-02 MED ORDER — BUSPIRONE HCL 15 MG PO TABS
15.0000 mg | ORAL_TABLET | Freq: Two times a day (BID) | ORAL | Status: DC
  Filled 2022-03-02: qty 1

## 2022-03-02 MED ORDER — FAMOTIDINE 20 MG PO TABS
ORAL_TABLET | ORAL | Status: AC
  Administered 2022-03-02: 20 mg via ORAL
  Filled 2022-03-02: qty 1

## 2022-03-02 MED ORDER — GLYCOPYRROLATE 0.2 MG/ML IJ SOLN
INTRAMUSCULAR | Status: DC | PRN
  Administered 2022-03-02: .2 mg via INTRAVENOUS

## 2022-03-02 MED ORDER — VASOPRESSIN 20 UNIT/ML IV SOLN
INTRAVENOUS | Status: DC | PRN
  Administered 2022-03-02: 2 [IU] via INTRAVENOUS

## 2022-03-02 MED ORDER — MORPHINE SULFATE (PF) 2 MG/ML IV SOLN: 1.0000 mg | INTRAVENOUS | Status: DC | PRN

## 2022-03-02 MED ORDER — ONDANSETRON HCL 4 MG/2ML IJ SOLN: 4.0000 mg | Freq: Once | INTRAMUSCULAR | Status: DC | PRN

## 2022-03-02 MED ORDER — ACETAMINOPHEN 10 MG/ML IV SOLN: 1000.0000 mg | Freq: Once | INTRAVENOUS | Status: DC | PRN

## 2022-03-02 MED ORDER — AMLODIPINE BESYLATE 5 MG PO TABS
5.0000 mg | ORAL_TABLET | Freq: Every day | ORAL | Status: DC
  Administered 2022-03-03: 5 mg via ORAL
  Filled 2022-03-02: qty 1

## 2022-03-02 MED ORDER — FAMOTIDINE 20 MG PO TABS: 20.0000 mg | ORAL_TABLET | Freq: Once | ORAL | Status: AC

## 2022-03-02 MED ORDER — AMLODIPINE BESYLATE 5 MG PO TABS
5.0000 mg | ORAL_TABLET | Freq: Every day | ORAL | Status: DC
  Filled 2022-03-02: qty 1

## 2022-03-02 MED ORDER — CITALOPRAM HYDROBROMIDE 20 MG PO TABS
40.0000 mg | ORAL_TABLET | Freq: Every day | ORAL | Status: DC
  Administered 2022-03-03: 40 mg via ORAL
  Filled 2022-03-02: qty 2

## 2022-03-02 MED ORDER — ACETAMINOPHEN 500 MG PO TABS
1000.0000 mg | ORAL_TABLET | Freq: Four times a day (QID) | ORAL | Status: DC
  Administered 2022-03-02 – 2022-03-03 (×5): 1000 mg via ORAL
  Filled 2022-03-02 (×5): qty 2

## 2022-03-02 MED ORDER — FENTANYL CITRATE (PF) 100 MCG/2ML IJ SOLN
25.0000 ug | INTRAMUSCULAR | Status: DC | PRN
  Administered 2022-03-02 (×3): 25 ug via INTRAVENOUS

## 2022-03-02 MED ORDER — LACTATED RINGERS IV SOLN: INTRAVENOUS | Status: DC

## 2022-03-02 MED ORDER — HYDROMORPHONE HCL 1 MG/ML IJ SOLN: 0.5000 mg | INTRAMUSCULAR | Status: DC | PRN

## 2022-03-02 MED ORDER — ALBUTEROL SULFATE HFA 108 (90 BASE) MCG/ACT IN AERS
INHALATION_SPRAY | RESPIRATORY_TRACT | Status: DC | PRN
  Administered 2022-03-02 (×2): 2 via RESPIRATORY_TRACT

## 2022-03-02 MED ORDER — ORAL CARE MOUTH RINSE: 15.0000 mL | Freq: Once | OROMUCOSAL | Status: AC

## 2022-03-02 MED ORDER — ALBUTEROL SULFATE (2.5 MG/3ML) 0.083% IN NEBU: 3.0000 mL | INHALATION_SOLUTION | Freq: Four times a day (QID) | RESPIRATORY_TRACT | Status: DC | PRN

## 2022-03-02 MED ORDER — POVIDONE-IODINE 10 % EX SWAB
2.0000 "application " | Freq: Once | CUTANEOUS | Status: AC
  Administered 2022-03-02: 2 via TOPICAL

## 2022-03-02 MED ORDER — BISACODYL 10 MG RE SUPP: 10.0000 mg | Freq: Every day | RECTAL | Status: DC | PRN

## 2022-03-02 MED ORDER — PROPOFOL 10 MG/ML IV BOLUS
INTRAVENOUS | Status: DC | PRN
  Administered 2022-03-02: 100 mg via INTRAVENOUS
  Administered 2022-03-02: 20 mg via INTRAVENOUS

## 2022-03-02 MED ORDER — DOCUSATE SODIUM 100 MG PO CAPS
100.0000 mg | ORAL_CAPSULE | Freq: Two times a day (BID) | ORAL | Status: DC
  Administered 2022-03-02 – 2022-03-03 (×3): 100 mg via ORAL
  Filled 2022-03-02 (×3): qty 1

## 2022-03-02 MED ORDER — HYDROCHLOROTHIAZIDE 12.5 MG PO TABS
12.5000 mg | ORAL_TABLET | Freq: Every morning | ORAL | Status: DC
  Administered 2022-03-02 – 2022-03-03 (×2): 12.5 mg via ORAL
  Filled 2022-03-02 (×3): qty 1

## 2022-03-02 MED ORDER — OXYCODONE HCL 5 MG PO TABS
ORAL_TABLET | ORAL | Status: AC
  Filled 2022-03-02: qty 1

## 2022-03-02 MED ORDER — LORATADINE 10 MG PO TABS
10.0000 mg | ORAL_TABLET | Freq: Every day | ORAL | Status: DC
  Administered 2022-03-02 – 2022-03-03 (×2): 10 mg via ORAL
  Filled 2022-03-02 (×2): qty 1

## 2022-03-02 MED ORDER — DEXAMETHASONE SODIUM PHOSPHATE 10 MG/ML IJ SOLN
INTRAMUSCULAR | Status: DC | PRN
  Administered 2022-03-02: 10 mg via INTRAVENOUS

## 2022-03-02 MED ORDER — MIDAZOLAM HCL 2 MG/2ML IJ SOLN
INTRAMUSCULAR | Status: DC | PRN
  Administered 2022-03-02: 2 mg via INTRAVENOUS

## 2022-03-02 MED ORDER — DEXMEDETOMIDINE (PRECEDEX) IN NS 20 MCG/5ML (4 MCG/ML) IV SYRINGE
PREFILLED_SYRINGE | INTRAVENOUS | Status: DC | PRN
Start: 2022-03-02 — End: 2022-03-02
  Administered 2022-03-02: 8 ug via INTRAVENOUS

## 2022-03-02 MED ORDER — EPHEDRINE SULFATE (PRESSORS) 50 MG/ML IJ SOLN
INTRAMUSCULAR | Status: DC | PRN
  Administered 2022-03-02: 10 mg via INTRAVENOUS

## 2022-03-02 MED ORDER — SUGAMMADEX SODIUM 200 MG/2ML IV SOLN
INTRAVENOUS | Status: DC | PRN
  Administered 2022-03-02: 300 mg via INTRAVENOUS

## 2022-03-02 MED ORDER — LORAZEPAM 1 MG PO TABS: 0.5000 mg | ORAL_TABLET | Freq: Three times a day (TID) | ORAL | Status: DC | PRN

## 2022-03-02 MED ORDER — TRAZODONE HCL 100 MG PO TABS
300.0000 mg | ORAL_TABLET | Freq: Every day | ORAL | Status: DC
  Administered 2022-03-02: 300 mg via ORAL
  Filled 2022-03-02: qty 3

## 2022-03-02 MED ORDER — OXYCODONE HCL 5 MG PO TABS
5.0000 mg | ORAL_TABLET | Freq: Once | ORAL | Status: AC | PRN
  Administered 2022-03-02: 5 mg via ORAL

## 2022-03-02 MED ORDER — APREPITANT 40 MG PO CAPS: 40.0000 mg | ORAL_CAPSULE | Freq: Once | ORAL | Status: AC

## 2022-03-02 MED ORDER — MIDAZOLAM HCL 2 MG/2ML IJ SOLN
INTRAMUSCULAR | Status: AC
  Filled 2022-03-02: qty 2

## 2022-03-02 MED ORDER — UMECLIDINIUM BROMIDE 62.5 MCG/ACT IN AEPB
1.0000 | INHALATION_SPRAY | Freq: Every day | RESPIRATORY_TRACT | Status: DC
  Administered 2022-03-02 – 2022-03-03 (×2): 1 via RESPIRATORY_TRACT
  Filled 2022-03-02: qty 7

## 2022-03-02 MED ORDER — SODIUM CHLORIDE 0.9 % IR SOLN
Status: DC | PRN
  Administered 2022-03-02: 100 mL

## 2022-03-02 MED ORDER — FENTANYL CITRATE (PF) 100 MCG/2ML IJ SOLN
INTRAMUSCULAR | Status: AC
  Filled 2022-03-02: qty 2

## 2022-03-02 MED ORDER — BUSPIRONE HCL 15 MG PO TABS
15.0000 mg | ORAL_TABLET | Freq: Two times a day (BID) | ORAL | Status: DC
  Administered 2022-03-02 – 2022-03-03 (×2): 15 mg via ORAL
  Filled 2022-03-02 (×2): qty 1

## 2022-03-02 MED ORDER — FENTANYL CITRATE (PF) 100 MCG/2ML IJ SOLN
INTRAMUSCULAR | Status: DC | PRN
  Administered 2022-03-02 (×2): 50 ug via INTRAVENOUS

## 2022-03-02 MED ORDER — POLYETHYLENE GLYCOL 3350 17 G PO PACK
17.0000 g | PACK | Freq: Every day | ORAL | Status: DC | PRN
  Filled 2022-03-02: qty 1

## 2022-03-02 MED ORDER — 0.9 % SODIUM CHLORIDE (POUR BTL) OPTIME
TOPICAL | Status: DC | PRN
  Administered 2022-03-02: 100 mL

## 2022-03-02 MED ORDER — PRIMIDONE 50 MG PO TABS
50.0000 mg | ORAL_TABLET | Freq: Every day | ORAL | Status: DC
  Administered 2022-03-02: 50 mg via ORAL
  Filled 2022-03-02: qty 1

## 2022-03-02 MED ORDER — APREPITANT 40 MG PO CAPS
ORAL_CAPSULE | ORAL | Status: AC
  Administered 2022-03-02: 40 mg via ORAL
  Filled 2022-03-02: qty 1

## 2022-03-02 MED ORDER — CHLORHEXIDINE GLUCONATE 0.12 % MT SOLN
OROMUCOSAL | Status: AC
  Administered 2022-03-02: 15 mL via OROMUCOSAL
  Filled 2022-03-02: qty 15

## 2022-03-02 MED ORDER — OXYCODONE HCL 5 MG/5ML PO SOLN: 5.0000 mg | Freq: Once | ORAL | Status: AC | PRN

## 2022-03-02 MED ORDER — PROPOFOL 10 MG/ML IV BOLUS
INTRAVENOUS | Status: AC
  Filled 2022-03-02: qty 20

## 2022-03-02 MED ORDER — BUPIVACAINE LIPOSOME 1.3 % IJ SUSP
INTRAMUSCULAR | Status: DC | PRN
  Administered 2022-03-02 (×2): 10 mL

## 2022-03-02 MED ORDER — OXYCODONE HCL 5 MG PO TABS
5.0000 mg | ORAL_TABLET | ORAL | Status: DC | PRN
  Administered 2022-03-02 – 2022-03-03 (×4): 5 mg via ORAL
  Filled 2022-03-02 (×5): qty 1

## 2022-03-02 MED ORDER — ONDANSETRON HCL 4 MG/2ML IJ SOLN
INTRAMUSCULAR | Status: DC | PRN
  Administered 2022-03-02 (×2): 4 mg via INTRAVENOUS

## 2022-03-02 MED ORDER — ACETAMINOPHEN 10 MG/ML IV SOLN: INTRAVENOUS | Status: DC | PRN

## 2022-03-02 MED ORDER — ACETAMINOPHEN 10 MG/ML IV SOLN
INTRAVENOUS | Status: AC
  Filled 2022-03-02: qty 100

## 2022-03-02 MED ORDER — BUDESON-GLYCOPYRROL-FORMOTEROL 160-9-4.8 MCG/ACT IN AERO: 2.0000 | INHALATION_SPRAY | Freq: Two times a day (BID) | RESPIRATORY_TRACT | Status: DC

## 2022-03-02 SURGICAL SUPPLY — 68 items
BACTOSHIELD CHG 4% 4OZ (MISCELLANEOUS) ×1
BAG DRN RND TRDRP ANRFLXCHMBR (UROLOGICAL SUPPLIES) ×1
BAG LAPAROSCOPIC 12 15 PORT 16 (BASKET) IMPLANT
BAG RETRIEVAL 10 (BASKET) ×1
BAG RETRIEVAL 12/15 (BASKET) ×2
BAG URINE DRAIN 2000ML AR STRL (UROLOGICAL SUPPLIES) ×2 IMPLANT
BASIN GRAD PLASTIC 32OZ STRL (MISCELLANEOUS) ×2 IMPLANT
BLADE SURG SZ11 CARB STEEL (BLADE) ×2 IMPLANT
CANNULA REDUC XI 12-8 STAPL (CANNULA) ×2
CANNULA REDUCER 12-8 DVNC XI (CANNULA) IMPLANT
CATH FOLEY SIL 2WAY 14FR5CC (CATHETERS) ×2 IMPLANT
COVER MAYO STAND REUSABLE (DRAPES) ×2 IMPLANT
COVER TIP SHEARS 8 DVNC (MISCELLANEOUS) IMPLANT
COVER TIP SHEARS 8MM DA VINCI (MISCELLANEOUS) ×2
COVER WAND RF STERILE (DRAPES) ×1 IMPLANT
DERMABOND ADVANCED (GAUZE/BANDAGES/DRESSINGS) ×1
DERMABOND ADVANCED .7 DNX12 (GAUZE/BANDAGES/DRESSINGS) ×1 IMPLANT
DRAPE ARM DVNC X/XI (DISPOSABLE) ×3 IMPLANT
DRAPE COLUMN DVNC XI (DISPOSABLE) ×1 IMPLANT
DRAPE DA VINCI XI ARM (DISPOSABLE) ×8
DRAPE DA VINCI XI COLUMN (DISPOSABLE) ×2
DRSG TEGADERM 2-3/8X2-3/4 SM (GAUZE/BANDAGES/DRESSINGS) ×7 IMPLANT
ELECT REM PT RETURN 9FT ADLT (ELECTROSURGICAL) ×2
ELECTRODE REM PT RTRN 9FT ADLT (ELECTROSURGICAL) ×1 IMPLANT
GAUZE 4X4 16PLY ~~LOC~~+RFID DBL (SPONGE) ×2 IMPLANT
GLOVE SURG SYN 6.5 ES PF (GLOVE) ×6 IMPLANT
GLOVE SURG SYN 6.5 PF PI (GLOVE) ×3 IMPLANT
GLOVE SURG UNDER POLY LF SZ6.5 (GLOVE) ×6 IMPLANT
GOWN STRL REUS W/ TWL LRG LVL3 (GOWN DISPOSABLE) ×3 IMPLANT
GOWN STRL REUS W/TWL LRG LVL3 (GOWN DISPOSABLE) ×6
GRASPER SUT TROCAR 14GX15 (MISCELLANEOUS) ×2 IMPLANT
IRRIGATION STRYKERFLOW (MISCELLANEOUS) IMPLANT
IRRIGATOR STRYKERFLOW (MISCELLANEOUS) ×2
IV NS 1000ML (IV SOLUTION) ×2
IV NS 1000ML BAXH (IV SOLUTION) IMPLANT
KIT PINK PAD W/HEAD ARE REST (MISCELLANEOUS) ×2
KIT PINK PAD W/HEAD ARM REST (MISCELLANEOUS) ×1 IMPLANT
LABEL OR SOLS (LABEL) ×2 IMPLANT
MANIFOLD NEPTUNE II (INSTRUMENTS) ×2 IMPLANT
NEEDLE HYPO 22GX1.5 SAFETY (NEEDLE) ×2 IMPLANT
NS IRRIG 1000ML POUR BTL (IV SOLUTION) ×3 IMPLANT
OBTURATOR OPTICAL STANDARD 8MM (TROCAR) ×2
OBTURATOR OPTICAL STND 8 DVNC (TROCAR) ×1
OBTURATOR OPTICALSTD 8 DVNC (TROCAR) ×1 IMPLANT
PACK GYN LAPAROSCOPIC (MISCELLANEOUS) ×2 IMPLANT
PAD ARMBOARD 7.5X6 YLW CONV (MISCELLANEOUS) ×2 IMPLANT
PAD OB MATERNITY 4.3X12.25 (PERSONAL CARE ITEMS) ×2 IMPLANT
PAD PREP 24X41 OB/GYN DISP (PERSONAL CARE ITEMS) ×2 IMPLANT
SCRUB CHG 4% DYNA-HEX 4OZ (MISCELLANEOUS) ×1 IMPLANT
SEAL CANN UNIV 5-8 DVNC XI (MISCELLANEOUS) ×3 IMPLANT
SEAL XI 5MM-8MM UNIVERSAL (MISCELLANEOUS) ×6
SEALER VESSEL DA VINCI XI (MISCELLANEOUS) ×2
SEALER VESSEL EXT DVNC XI (MISCELLANEOUS) ×1 IMPLANT
SET TUBE SMOKE EVAC HIGH FLOW (TUBING) ×2 IMPLANT
SOLUTION ELECTROLUBE (MISCELLANEOUS) ×1 IMPLANT
SPONGE GAUZE 2X2 8PLY STRL LF (GAUZE/BANDAGES/DRESSINGS) ×5 IMPLANT
SURGILUBE 2OZ TUBE FLIPTOP (MISCELLANEOUS) ×2 IMPLANT
SUT MNCRL 4-0 (SUTURE) ×2
SUT MNCRL 4-0 27XMFL (SUTURE) ×1
SUT VICRYL 0 AB UR-6 (SUTURE) ×1 IMPLANT
SUTURE MNCRL 4-0 27XMF (SUTURE) ×1 IMPLANT
SYR 10ML LL (SYRINGE) ×2 IMPLANT
SYR 50ML LL SCALE MARK (SYRINGE) ×1 IMPLANT
SYS BAG RETRIEVAL 10MM (BASKET) ×1
SYSTEM BAG RETRIEVAL 10MM (BASKET) IMPLANT
TAPE TRANSPORE STRL 2 31045 (GAUZE/BANDAGES/DRESSINGS) ×1 IMPLANT
TRAP SPECIMEN MUCUS 40CC (MISCELLANEOUS) ×1 IMPLANT
WATER STERILE IRR 500ML POUR (IV SOLUTION) ×2 IMPLANT

## 2022-03-02 NOTE — Anesthesia Procedure Notes (Signed)
Procedure Name: Intubation ?Date/Time: 03/02/2022 7:36 AM ?Performed by: Kelton Pillar, CRNA ?Pre-anesthesia Checklist: Patient identified, Emergency Drugs available, Suction available and Patient being monitored ?Patient Re-evaluated:Patient Re-evaluated prior to induction ?Oxygen Delivery Method: Circle system utilized ?Preoxygenation: Pre-oxygenation with 100% oxygen ?Induction Type: IV induction ?Ventilation: Mask ventilation without difficulty ?Laryngoscope Size: McGraph and 3 ?Grade View: Grade I ?Tube type: Oral ?Tube size: 6.5 mm ?Number of attempts: 1 ?Airway Equipment and Method: Stylet and Oral airway ?Placement Confirmation: ETT inserted through vocal cords under direct vision, positive ETCO2, breath sounds checked- equal and bilateral and CO2 detector ?Secured at: 21 cm ?Tube secured with: Tape ?Dental Injury: Teeth and Oropharynx as per pre-operative assessment  ? ? ? ? ?

## 2022-03-02 NOTE — Interval H&P Note (Signed)
History and Physical Interval Note: ? ?03/02/2022 ?7:20 AM ? ?Nancy Blackburn  has presented today for surgery, with the diagnosis of left ovarian cyst.  The various methods of treatment have been discussed with the patient and family. After consideration of risks, benefits and other options for treatment, the patient has consented to  Procedure(s): ?XI ROBOTIC [place] (Bilateral) as a surgical intervention.  The patient's history has been reviewed, patient examined, no change in status, stable for surgery.  I have reviewed the patient's chart and labs.  Questions were answered to the patient's satisfaction.   ? ? ?[place] ? ? ?

## 2022-03-02 NOTE — Discharge Instructions (Addendum)
Postoperative Instructions ? ?Take Tylenol 1000 mg every 6 hours for pain control.   I recommend taking this medication consistently for at least 1 week after your surgery. ? ?Take Roxicodone 5 mg as needed every 6 hours for severe pain.  Some people will take this before bed to make it easier to sleep.  Do not take this pain medicine longer than 7 days after your surgery. ? ?Please take small walks around your living space every 2-3 hours after your surgery.  You can start this the day of your surgery or the day after your surgery.  Continue this for the first 7 days after surgery. After the first 7 days you can slowly increase you activity. ? ?Do not lift heavy objects for 6 weeks after the surgery.   ? ?It can be a good idea to take an over-the-counter stool softener after your surgery.  You can take Colace 100 mg twice a day.  Try to drink 60 to 90 ounces of water a day to keep her stools soft. ? ?After the surgery your diet should consist of foods that are easy on your stomach.  Soups, sandwiches, crackers, rice, applesauce, popsicles, mashed potatoes, cooked vegetables or canned fruits are best.  After you have a normal bowel movement you can return to your regular diet. ? ?You should have a bowel movement within 3 days of the surgery.  If you not able to have a bowel movement please contact her office so we can review over-the-counter options to help have a bowel movement.  Walking regularly will help with having a bowel movement. ? ?If you have a bandage covering your incisions he could remove it the day after your surgery.  Your incisions are closed with dissolvable suture and a topical glue.  The topical glue you can peel off gently in the shower 2 weeks after your surgery.  You should shower every day after the surgery.  You can let soapy water gently run over the incisions to keep them clean.  Please contact us if your incisions appear infected or if you have any drainage of fluid from the incisions.  I  would like to know about these symptoms same day since wound infections can get bad fast. ? ? ?AMBULATORY SURGERY  ?DISCHARGE INSTRUCTIONS ? ? ?The drugs that you were given will stay in your system until tomorrow so for the next 24 hours you should not: ? ?Drive an automobile ?Make any legal decisions ?Drink any alcoholic beverage ? ? ?You may resume regular meals tomorrow.  Today it is better to start with liquids and gradually work up to solid foods. ? ?You may eat anything you prefer, but it is better to start with liquids, then soup and crackers, and gradually work up to solid foods. ? ? ?Please notify your doctor immediately if you have any unusual bleeding, trouble breathing, redness and pain at the surgery site, drainage, fever, or pain not relieved by medication. ? ? ? ?Additional Instructions: ? ? ? ? ? ? ? ?Please contact your physician with any problems or Same Day Surgery at [phone], Monday through Friday 6 am to 4 pm, or [place] at [facility] number at [phone].  ? ? ? ?

## 2022-03-02 NOTE — Op Note (Signed)
Operative Note  ? ?PRE-OP DIAGNOSIS: Left ovarian cyst ?  ?POST-OP DIAGNOSIS: Right ovarian cyst ? ?SURGEON: Adrian Prows MD ? ?ASSISTANT:  Santiago Glad MD  ? ?ANESTHESIA: General ? ?PROCEDURE: Procedure(s):Robotic assisted laparoscopic bilateral salpingo-oophorectomy   ? ?ESTIMATED BLOOD LOSS: 5 cc ? ?DRAINS: Foley ? ?SPECIMENS: Bilateral fallopian tubes and ovaries  ? ?COMPLICATIONS: None ? ?DISPOSITION: PACU ? ?CONDITION: Stable ? ?INDICATIONS: Enlarged right ovarian cyst ? ?FINDINGS: Exam under anesthesia revealed an absent uterus. The adnexa showed an enlarged right ovary and a normal left ovary.  The upper abdomen was  normal including omentum, bowel, liver, stomach, and diaphragmatic surfaces. There was no evidence of grossly enlarged pelvic or right para-aortic lymph nodes.  ? ?PROCEDURE IN DETAIL: After informed consent was obtained, the patient was taken to the operating room where anesthesia was obtained without difficulty. The patient was positioned in the dorsal lithotomy position in [place] and her arms were carefully tucked at her sides and the usual precautions were taken.  She was prepped and draped in normal sterile fashion.  Time-out was performed. A foley catheter was placed.  ? ?Laparoscopic entry was obtained via a supra-umbilical incision with by the direct entry technique. The pelvis visualized with noted findings above.   The 3 additional 8 mm robotic ports were placed in a horizontal line across the upper abdomen. The umbilical port was exchanged for a 12 mm port. The patient was placed in Trendelenburg and the bowel was displaced up into the upper abdomen. Robotic docking was performed.  The right and left fallopian tubes and ovaries were identified.  The monopolar scissor was used to create a window in the peritoneum and isolate the right infundibular pelvic ligament. The vessel sealer was used to seal and ligate the right infundibular pelvic ligament.  The right uterine  ovarian ligament was sealed and ligated to excise the right fallopian tube and ovary. The ovary and tube were then placed into the pelvis. The procedure was repeated in the same fashion on the left.  Excellent hemostasis was noted. The specimens were placed into laparoscopic bags. The left ovary was easily removed.A second bag was used for the right ovary which was enlarged with a cyst. The laparoscopic bag was brought to the surface of the incision.  The robot was then undocked. ? ?At the bedside, the specimen was delivered thru the umbilical incision by rupturing the cyst inside the bag and suctioning the fluid inside the bag. The right ovary and fallopian tube were then without difficulty. Care was taken to prevent spillage of specimen into the abdomen.  ? ?The right ovary and fallopian tube specimen was sent for frozen section. Pathology reported that the specimen appeared benign and would prepare it for permanent section. The left fallopian tube and ovary was also send to pathology for permanent section.  ?   ?The umbilical fascial incisions were closed with the PMI device and by direct suturing with an 0-vicryl suture. All  instruments were removed from the abdomen. The air was expelled from the abdomen. The trocars were removed.  The skin incisions were closed with 4-0 monocryl with a subcuticular stitch and Indermil glue.  The patient tolerated the procedure well.  Sponge, lap and needle counts were correct x2.  The patient was taken to recovery room in excellent condition. ? ?The foley was removed from the bladder. ? ?Dr. Theora Gianotti assisted with this case. This was a high level case requiring a Physicist, medical. No other assistant was readily  available. She assisted with placement of ports, removal of the ovary through the umbilical incision and closure of the fascia during the case.  ? ?Adrian Prows MD, [place] ?[facility] ?03/02/2022 ?9:43 AM ? ?

## 2022-03-02 NOTE — Plan of Care (Signed)

## 2022-03-02 NOTE — Transfer of Care (Signed)
Immediate Anesthesia Transfer of Care Note ? ?[name] ? ?Procedure(s) Performed: XI ROBOTIC ASSISTED SALPINGO OOPHORECTOMY (Bilateral) ? ?Patient Location: PACU ? ?Anesthesia Type:General ? ?Level of Consciousness: awake, drowsy and patient cooperative ? ?Airway & Oxygen Therapy: Patient Spontanous Breathing and Patient connected to face mask oxygen ? ?Post-op Assessment: Report given to RN and Post -op Vital signs reviewed and stable ? ?Post vital signs: Reviewed and stable ? ?Last Vitals:  ?Vitals Value Taken Time  ?BP    ?Temp    ?Pulse 81 03/02/22 0941  ?Resp 17 03/02/22 0941  ?SpO2 96 % 03/02/22 0941  ?Vitals shown include unvalidated device data. ? ?Last Pain:  ?Vitals:  ? 03/02/22 0627  ?TempSrc: Temporal  ?PainSc: 4   ?   ? ?  ? ?Complications: No notable events documented. ?

## 2022-03-03 ENCOUNTER — Telehealth: Payer: Self-pay

## 2022-03-03 DIAGNOSIS — J45909 Unspecified asthma, uncomplicated: Secondary | ICD-10-CM | POA: Diagnosis not present

## 2022-03-03 DIAGNOSIS — Z8541 Personal history of malignant neoplasm of cervix uteri: Secondary | ICD-10-CM | POA: Diagnosis not present

## 2022-03-03 DIAGNOSIS — Z90710 Acquired absence of both cervix and uterus: Secondary | ICD-10-CM | POA: Diagnosis not present

## 2022-03-03 DIAGNOSIS — I2510 Atherosclerotic heart disease of native coronary artery without angina pectoris: Secondary | ICD-10-CM | POA: Diagnosis not present

## 2022-03-03 DIAGNOSIS — N83202 Unspecified ovarian cyst, left side: Secondary | ICD-10-CM | POA: Diagnosis not present

## 2022-03-03 DIAGNOSIS — N83201 Unspecified ovarian cyst, right side: Secondary | ICD-10-CM | POA: Diagnosis not present

## 2022-03-03 LAB — CBC
HCT: 37.4 % (ref 36.0–46.0)
Hemoglobin: 12 g/dL (ref 12.0–15.0)
MCH: 27.8 pg (ref 26.0–34.0)
MCHC: 32.1 g/dL (ref 30.0–36.0)
MCV: 86.8 fL (ref 80.0–100.0)
Platelets: 245 10*3/uL (ref 150–400)
RBC: 4.31 MIL/uL (ref 3.87–5.11)
RDW: 13.3 % (ref 11.5–15.5)
WBC: 12.2 10*3/uL — ABNORMAL HIGH (ref 4.0–10.5)
nRBC: 0 % (ref 0.0–0.2)

## 2022-03-03 LAB — COMPREHENSIVE METABOLIC PANEL
ALT: 9 U/L (ref 0–44)
AST: 17 U/L (ref 15–41)
Albumin: 2.9 g/dL — ABNORMAL LOW (ref 3.5–5.0)
Alkaline Phosphatase: 68 U/L (ref 38–126)
Anion gap: 7 (ref 5–15)
BUN: 15 mg/dL (ref 8–23)
CO2: 30 mmol/L (ref 22–32)
Calcium: 8.9 mg/dL (ref 8.9–10.3)
Chloride: 101 mmol/L (ref 98–111)
Creatinine, Ser: 0.89 mg/dL (ref 0.44–1.00)
GFR, Estimated: >60 mL/min (ref >60)
Glucose, Bld: 139 mg/dL — ABNORMAL HIGH (ref 70–99)
Potassium: 3.9 mmol/L (ref 3.5–5.1)
Sodium: 138 mmol/L (ref 135–145)
Total Bilirubin: 0.5 mg/dL (ref 0.3–1.2)
Total Protein: 6 g/dL — ABNORMAL LOW (ref 6.5–8.1)

## 2022-03-03 LAB — SURGICAL PATHOLOGY

## 2022-03-03 LAB — CYTOLOGY - NON PAP

## 2022-03-03 MED ORDER — OXYCODONE HCL 5 MG PO TABS: 5.0000 mg | ORAL_TABLET | Freq: Three times a day (TID) | ORAL | 0 refills | Status: DC | PRN

## 2022-03-03 MED ORDER — DOCUSATE SODIUM 100 MG PO CAPS
100.0000 mg | ORAL_CAPSULE | Freq: Two times a day (BID) | ORAL | 0 refills | Status: DC
Start: 2022-03-03 — End: 2022-07-06

## 2022-03-03 MED ORDER — ACETAMINOPHEN 500 MG PO TABS: 1000.0000 mg | ORAL_TABLET | Freq: Four times a day (QID) | ORAL | 0 refills | Status: AC

## 2022-03-03 NOTE — Progress Notes (Signed)
?  Subjective:  ? ?She reports she has been feeling well. Her pain is controlled. She is ambulating. She is tolerating a regular diet. She has passed flatus. She is urinating normally. She desires discharge home ? ?Objective:  ?Blood pressure (!) 142/74, pulse 85, temperature 98 ?F (36.7 ?C), temperature source Oral, resp. rate 20, height '5\' 7"'$  (1.702 m), weight 78 kg, SpO2 97 %. ? ?General: NAD ?Pulmonary: no increased work of breathing ?Abdomen: non-distended, non-tender ?Incision: clean, dry, intact, no erythema, no induration ?Extremities: no edema, no erythema, no tenderness ? ?Results for orders placed or performed during the hospital encounter of 03/02/22 (from the past 72 hour(s))  ?CBC     Status: Abnormal  ? Collection Time: 03/03/22  5:24 AM  ?Result Value Ref Range  ? WBC 12.2 (H) 4.0 - 10.5 K/uL  ? RBC 4.31 3.87 - 5.11 MIL/uL  ? Hemoglobin 12.0 12.0 - 15.0 g/dL  ? HCT 37.4 36.0 - 46.0 %  ? MCV 86.8 80.0 - 100.0 fL  ? MCH 27.8 26.0 - 34.0 pg  ? MCHC 32.1 30.0 - 36.0 g/dL  ? RDW 13.3 11.5 - 15.5 %  ? Platelets 245 150 - 400 K/uL  ? nRBC 0.0 0.0 - 0.2 %  ?  Comment: Performed at [facility], [address] [place], [place] [address]  ?Comprehensive metabolic panel     Status: Abnormal  ? Collection Time: 03/03/22  5:24 AM  ?Result Value Ref Range  ? Sodium 138 135 - 145 mmol/L  ? Potassium 3.9 3.5 - 5.1 mmol/L  ? Chloride 101 98 - 111 mmol/L  ? CO2 30 22 - 32 mmol/L  ? Glucose, Bld 139 (H) 70 - 99 mg/dL  ?  Comment: Glucose reference range applies only to samples taken after fasting for at least 8 hours.  ? BUN 15 8 - 23 mg/dL  ? Creatinine, Ser 0.89 0.44 - 1.00 mg/dL  ? Calcium 8.9 8.9 - 10.3 mg/dL  ? Total Protein 6.0 (L) 6.5 - 8.1 g/dL  ? Albumin 2.9 (L) 3.5 - 5.0 g/dL  ? AST 17 15 - 41 U/L  ? ALT 9 0 - 44 U/L  ? Alkaline Phosphatase 68 38 - 126 U/L  ? Total Bilirubin 0.5 0.3 - 1.2 mg/dL  ? GFR, Estimated >60 >60 mL/min  ?  Comment: (NOTE) ?Calculated using the CKD-EPI Creatinine Equation  (2021) ?  ? Anion gap 7 5 - 15  ?  Comment: Performed at [facility], [address] [place], [place] [address]  ? ? ? ?Assessment:  ? 76 y.o. s/p xi robot assisted laparoscopic bilateral salpingo-oophorectomy- POD #1 ? ? ?Plan:  ?1) Meeting postoperative goals, will discharge home today. Follow up Monday in office as planned.  ? ?Adrian Prows MD, [place] ?[facility] ?03/03/2022 ?7:59 AM ? ?

## 2022-03-03 NOTE — Discharge Summary (Signed)
Physician Discharge Summary  ?Patient ID: ?Nancy Blackburn ?MRN: [id number] ?DOB/AGE: Dec 20, 1945 76 y.o. ? ?Admit date: 03/02/2022 ?Discharge date: 03/03/2022 ? ?Admission Diagnoses: ? ?Discharge Diagnoses:  ?Principal Problem: ?  Right ovarian cyst ?Active Problems: ?  Cyst of right ovary ? ? ?Discharged Condition: good ? ?Hospital Course: She was admitted for observation after her surgery. She tolerated a regular diet, ambulated and met all goal prior to discharge. She was discharged home in stable condition ? ?Consults: None ? ?Significant Diagnostic Studies: labs: see EPIC ? ?Treatments: surgery: xi robot assisted laparoscopic bilateral salpingo-oophorectomy ? ?Discharge Exam: ?Blood pressure (!) 142/74, pulse 85, temperature 98 ?F (36.7 ?C), temperature source Oral, resp. rate 20, height 5' 7" (1.702 m), weight 78 kg, SpO2 97 %. ?General appearance: alert, cooperative, and appears stated age ?Resp: clear to auscultation bilaterally ?Extremities: extremities normal, atraumatic, no cyanosis or edema ?Skin: Skin color, texture, turgor normal. No rashes or lesions ?Incision/Wound: clean dry and intact, healing well. ? ?Disposition: Discharge disposition: 01-Home or Self Care ? ? ? ? ? ? ?Discharge Instructions   ? ? Call MD for:  difficulty breathing, headache or visual disturbances   Complete by: As directed ?  ? Call MD for:  extreme fatigue   Complete by: As directed ?  ? Call MD for:  hives   Complete by: As directed ?  ? Call MD for:  persistant dizziness or light-headedness   Complete by: As directed ?  ? Call MD for:  persistant nausea and vomiting   Complete by: As directed ?  ? Call MD for:  redness, tenderness, or signs of infection (pain, swelling, redness, odor or green/yellow discharge around incision site)   Complete by: As directed ?  ? Call MD for:  severe uncontrolled pain   Complete by: As directed ?  ? Call MD for:  temperature >100.4   Complete by: As directed ?  ? Diet general   Complete by:  As directed ?  ? Driving Restrictions   Complete by: As directed ?  ? Do not drive while taking narcotic medications  ? Increase activity slowly   Complete by: As directed ?  ? May shower / Bathe   Complete by: As directed ?  ? Remove dressing in 24 hours   Complete by: As directed ?  ? ?  ? ?Allergies as of 03/03/2022   ? ?   Reactions  ? Anti-inflammatory Enzyme [nutritional Supplements] Other (See Comments)  ? REACTION: Due to bleeding ulcer  ? Cymbalta [duloxetine Hcl] Other (See Comments)  ? REACTION: Urinary retention  ? Clarithromycin Other (See Comments)  ? Upsets stomach  ? Donepezil Hcl Other (See Comments)  ? Significant bradycardia causing syncope  ? Nsaids   ? GI BLEED  ? Ranitidine Diarrhea  ? severe diarrhea  ? Venlafaxine   ? severe depression  ? ?  ? ?  ?Medication List  ?  ? ?TAKE these medications   ? ?acetaminophen 500 MG tablet ?Commonly known as: TYLENOL ?Take 500 mg by mouth every 6 (six) hours as needed for moderate pain. ?What changed: Another medication with the same name was added. Make sure you understand how and when to take each. ?  ?acetaminophen 500 MG tablet ?Commonly known as: TYLENOL ?Take 2 tablets (1,000 mg total) by mouth every 6 (six) hours. ?What changed: You were already taking a medication with the same name, and this prescription was added. Make sure you understand how and when to take  each. ?  ?albuterol 108 (90 Base) MCG/ACT inhaler ?Commonly known as: VENTOLIN HFA ?Inhale 1-2 puffs into the lungs every 6 (six) hours as needed for wheezing or shortness of breath. ?  ?amLODipine 5 MG tablet ?Commonly known as: NORVASC ?Take 1 tablet (5 mg total) by mouth daily. ?  ?BIOFREEZE EX ?Apply 1 application. topically daily as needed (pain). ?  ?Breztri Aerosphere 160-9-4.8 MCG/ACT Aero ?Generic drug: Budeson-Glycopyrrol-Formoterol ?Inhale 2 puffs into the lungs in the morning and at bedtime. Through [place] Patient Assistance ?  ?busPIRone 15 MG tablet ?Commonly known as:  BUSPAR ?Take 15 mg by mouth 2 (two) times daily. ?  ?Calcium 600+D3 600-20 MG-MCG Tabs ?Generic drug: Calcium Carb-Cholecalciferol ?Take 1 tablet by mouth daily. ?  ?citalopram 40 MG tablet ?Commonly known as: CELEXA ?Take 40 mg by mouth daily. ?  ?docusate sodium 100 MG capsule ?Commonly known as: COLACE ?Take 1 capsule (100 mg total) by mouth 2 (two) times daily. ?  ?fexofenadine 180 MG tablet ?Commonly known as: ALLEGRA ?Take 180 mg by mouth at bedtime. ?  ?hydrochlorothiazide 12.5 MG capsule ?Commonly known as: MICROZIDE ?TAKE 1 CAPSULE BY MOUTH EVERY MORNING ?  ?LORazepam 0.5 MG tablet ?Commonly known as: ATIVAN ?TAKE 1 TABLET BY MOUTH 3 TIMES DAILY AS NEEDED FOR ANXIETY ?  ?oxyCODONE 5 MG immediate release tablet ?Commonly known as: Roxicodone ?Take 1 tablet (5 mg total) by mouth every 8 (eight) hours as needed. ?  ?primidone 50 MG tablet ?Commonly known as: MYSOLINE ?Take 50 mg by mouth at bedtime. ?  ?rosuvastatin 40 MG tablet ?Commonly known as: CRESTOR ?Take 1 tablet (40 mg total) by mouth at bedtime. ?  ?traZODone 100 MG tablet ?Commonly known as: DESYREL ?Take 300 mg by mouth at bedtime. ?  ?vitamin B-12 1000 MCG tablet ?Commonly known as: CYANOCOBALAMIN ?Take 1,000 mcg by mouth daily. ?  ?Vitamin D3 25 MCG tablet ?Commonly known as: Vitamin D ?Take 1 tablet (1,000 Units total) by mouth daily. ?  ? ?  ? ? Follow-up Information   ? ? Homero Fellers, MD. Go in 1 week(s).   ?Specialty: Obstetrics and Gynecology ?Why: For incision check - follow-up appointment on  March 27th at 10:35 am ?Contact information: ?[address] ?[place] [address] ?[phone] ? ? ?  ?  ? ?  ?  ? ?  ? ? ?Signed: ?Ammon Muscatello R Izaak Sahr ?03/03/2022, 8:06 AM ? ? ?

## 2022-03-03 NOTE — Plan of Care (Signed)
Patient Appropriate for discharge  ?

## 2022-03-03 NOTE — Telephone Encounter (Signed)
Transition Care Management Follow-up Telephone Call ?Date of discharge and from where: [facility] 3/23 ?How have you been since you were released from the hospital? [place] ?Any questions or concerns? No ? ?Items Reviewed: ?Did the pt receive and understand the discharge instructions provided? Yes  ?Medications obtained and verified? Yes  ?Other? No  ?Any new allergies since your discharge? No  ?Dietary orders reviewed? No ?Do you have support at home? Yes  ? ?Home Care and Equipment/Supplies: ? ? ?Functional Questionnaire: (I = Independent and D = Dependent) ?ADLs: I ? ?Bathing/Dressing- I ? ?Meal Prep- I ? ?Eating- I ? ?Maintaining continence- I ? ?Transferring/Ambulation- I ? ?Managing Meds- I ? ?Follow up appointments reviewed: ? ?PCP Hospital f/u appt confirmed? Yes  Scheduled to see Dr.Gilbert on 3/29 @ 1:40. ?[facility] f/u appt confirmed? Yes  Scheduled to see Dr. Gilman Schmidt on 3/27 @ 10:35. ?Are transportation arrangements needed? No  ?If their condition worsens, is the pt aware to call PCP or go to the Emergency Dept.? Yes ?Was the patient provided with contact information for the PCP's office or ED? Yes ?Was to pt encouraged to call back with questions or concerns? Yes  ?

## 2022-03-07 ENCOUNTER — Encounter: Payer: Self-pay | Admitting: Obstetrics and Gynecology

## 2022-03-07 ENCOUNTER — Ambulatory Visit (INDEPENDENT_AMBULATORY_CARE_PROVIDER_SITE_OTHER): Payer: Medicare Other | Admitting: Obstetrics and Gynecology

## 2022-03-07 ENCOUNTER — Other Ambulatory Visit: Payer: Self-pay

## 2022-03-07 VITALS — BP 122/82 | Ht 67.0 in | Wt 169.0 lb

## 2022-03-07 DIAGNOSIS — Z4889 Encounter for other specified surgical aftercare: Secondary | ICD-10-CM

## 2022-03-07 NOTE — Progress Notes (Signed)
?  Postoperative Follow-up ?Patient presents post op from  [facility] Robot-assisted Laparoscopic Bilateral Salpingo-oophorectomy  for  right ovarian cyst , 1 week ago. ? ?Subjective: ?She reports that since the surgery she has been doing very well at home. She is eating normally. She has had two normal bowel movements. Pain is controlled with current analgesics. Medications being used: narcotic analgesics including oxycodone (Oxycontin, Oxyir).  She reports generally only taking this medication once before sleeping at night ?Activity: normal activities of daily living.  ? ?Objective: ?BP 122/82   Ht '5\' 7"'$  (1.702 m)   Wt 169 lb (76.7 kg)   BMI 26.47 kg/m?  ?Physical Exam ?Constitutional:   ?   Appearance: Normal appearance. She is well-developed.  ?HENT:  ?   Head: Normocephalic and atraumatic.  ?Eyes:  ?   Extraocular Movements: Extraocular movements intact.  ?   Pupils: Pupils are equal, round, and reactive to light.  ?Neck:  ?   Thyroid: No thyromegaly.  ?Cardiovascular:  ?   Rate and Rhythm: Normal rate and regular rhythm.  ?   Heart sounds: Normal heart sounds.  ?Pulmonary:  ?   Effort: Pulmonary effort is normal.  ?   Breath sounds: Normal breath sounds.  ?Abdominal:  ?   General: Bowel sounds are normal. There is no distension.  ?   Palpations: Abdomen is soft. There is no mass.  ?   Comments: Incisions are clean dry and intact  ?Musculoskeletal:  ?   Cervical back: Neck supple.  ?Neurological:  ?   Mental Status: She is alert and oriented to person, place, and time.  ?Skin: ?   General: Skin is warm and dry.  ?Psychiatric:     ?   Behavior: Behavior normal.     ?   Thought Content: Thought content normal.     ?   Judgment: Judgment normal.  ?Vitals reviewed.  ? ? ?Assessment: ?s/p :   Xi Robot-assisted Laparoscopic Bilateral Salpingo-oophorectomy  stable ? ?Plan: ? ?Patient has done well after surgery with no apparent complications.  ?  ?Pathology result reviewed,  ? ?I have discussed the post-operative course to  date, and the expected progress moving forward.  The patient understands what complications to be concerned about.  I will see the patient in routine follow up, or sooner if needed.   ? ?Activity plan: No heavy lifting.  ? ? ?Adrian Prows MD, ?[facility] ?03/07/2022 ?11:00 AM ? ? ? ?

## 2022-03-08 NOTE — Progress Notes (Deleted)
?  ? ? ?  Established patient visit ? ? ?Patient: Nancy HUEBERT   DOB: 03-21-1946   76 y.o. Female  MRN: [id number] ?Visit Date: 03/09/2022 ? ?Today's healthcare provider: Wilhemena Durie, MD  ? ?No chief complaint on file. ? ?Subjective  ?  ?HPI  ?*** ? ?Medications: ?Outpatient Medications Prior to Visit  ?Medication Sig  ? acetaminophen (TYLENOL) 500 MG tablet Take 500 mg by mouth every 6 (six) hours as needed for moderate pain.  ? acetaminophen (TYLENOL) 500 MG tablet Take 2 tablets (1,000 mg total) by mouth every 6 (six) hours.  ? albuterol (VENTOLIN HFA) 108 (90 Base) MCG/ACT inhaler Inhale 1-2 puffs into the lungs every 6 (six) hours as needed for wheezing or shortness of breath.  ? amLODipine (NORVASC) 5 MG tablet Take 1 tablet (5 mg total) by mouth daily.  ? Budeson-Glycopyrrol-Formoterol (BREZTRI AEROSPHERE) 160-9-4.8 MCG/ACT AERO Inhale 2 puffs into the lungs in the morning and at bedtime. Through [employer] Patient Assistance  ? busPIRone (BUSPAR) 15 MG tablet Take 15 mg by mouth 2 (two) times daily.  ? Calcium Carb-Cholecalciferol (CALCIUM 600+D3) 600-800 MG-UNIT TABS Take 1 tablet by mouth daily.  ? cholecalciferol (VITAMIN D) 25 MCG tablet Take 1 tablet (1,000 Units total) by mouth daily.  ? citalopram (CELEXA) 40 MG tablet Take 40 mg by mouth daily.  ? docusate sodium (COLACE) 100 MG capsule Take 1 capsule (100 mg total) by mouth 2 (two) times daily.  ? fexofenadine (ALLEGRA) 180 MG tablet Take 180 mg by mouth at bedtime.  ? hydrochlorothiazide (MICROZIDE) 12.5 MG capsule TAKE 1 CAPSULE BY MOUTH EVERY MORNING  ? LORazepam (ATIVAN) 0.5 MG tablet TAKE 1 TABLET BY MOUTH 3 TIMES DAILY AS NEEDED FOR ANXIETY  ? Menthol, Topical Analgesic, (BIOFREEZE EX) Apply 1 application. topically daily as needed (pain).  ? oxyCODONE (ROXICODONE) 5 MG immediate release tablet Take 1 tablet (5 mg total) by mouth every 8 (eight) hours as needed.  ? primidone (MYSOLINE) 50 MG tablet Take 50 mg by mouth at bedtime.  ?  rosuvastatin (CRESTOR) 40 MG tablet Take 1 tablet (40 mg total) by mouth at bedtime.  ? traZODone (DESYREL) 100 MG tablet Take 300 mg by mouth at bedtime.  ? vitamin B-12 (CYANOCOBALAMIN) 1000 MCG tablet Take 1,000 mcg by mouth daily.  ? ?No facility-administered medications prior to visit.  ? ? ?Review of Systems  ?Constitutional:  Negative for appetite change, chills, fatigue and fever.  ?Respiratory:  Negative for chest tightness and shortness of breath.   ?Cardiovascular:  Negative for chest pain and palpitations.  ?Gastrointestinal:  Negative for abdominal pain, nausea and vomiting.  ?Neurological:  Negative for dizziness and weakness.  ? ?{Labs  Heme  Chem  Endocrine  Serology  Results Review (optional):23779} ?  Objective  ?  ?There were no vitals taken for this visit. ?{Show previous vital signs (optional):23777} ? ?Physical Exam  ?*** ? ?No results found for any visits on 03/09/22. ? Assessment & Plan  ?  ? ?*** ? ?No follow-ups on file.  ?   ? ?{provider attestation***:1} ? ? ?Richard Cranford Mon, MD  ?[facility] ?[phone] (phone) ?[phone] (fax) ? ?[facility] ?

## 2022-03-08 NOTE — Anesthesia Postprocedure Evaluation (Signed)
Anesthesia Post Note ? ?[name] ? ?Procedure(s) Performed: XI ROBOTIC ASSISTED SALPINGO OOPHORECTOMY (Bilateral) ? ?Patient location during evaluation: PACU ?Anesthesia Type: General ?Level of consciousness: awake and alert ?Pain management: pain level controlled ?Vital Signs Assessment: post-procedure vital signs reviewed and stable ?Respiratory status: spontaneous breathing, nonlabored ventilation, respiratory function stable and patient connected to nasal cannula oxygen ?Cardiovascular status: blood pressure returned to baseline and stable ?Postop Assessment: no apparent nausea or vomiting ?Anesthetic complications: no ? ? ?No notable events documented. ? ? ?Last Vitals:  ?Vitals:  ? 03/03/22 0432 03/03/22 0905  ?BP: (!) 142/74 (!) 142/65  ?Pulse: 85 77  ?Resp: 20 20  ?Temp: 36.7 ?C 36.9 ?C  ?SpO2: 97% 98%  ?  ?Last Pain:  ?Vitals:  ? 03/03/22 1121  ?TempSrc:   ?PainSc: 8   ? ? ?  ?  ?  ?  ?  ?  ? ?Molli Barrows ? ? ? ? ?

## 2022-03-09 ENCOUNTER — Ambulatory Visit: Payer: Medicare Other | Admitting: Family Medicine

## 2022-03-25 ENCOUNTER — Telehealth: Payer: Self-pay

## 2022-03-25 NOTE — Progress Notes (Signed)
Chronic Care Management  APPOINTMENT REMINDER ? ? ?Called EVALIE HARGRAVES, No answer, left message of appointment on 03/28/2022 at 3:00 pm via telephone visit with Junius Argyle , Pharm D. Notified to have all medications, supplements, blood pressure and/or blood sugar logs available during appointment and to return call if need to reschedule. ? ? ? ?Bessie Kellihan,CPA ?Clinical Pharmacist Assistant ?[phone]  ? ?

## 2022-03-28 ENCOUNTER — Encounter: Payer: Self-pay | Admitting: Obstetrics and Gynecology

## 2022-03-28 ENCOUNTER — Ambulatory Visit (INDEPENDENT_AMBULATORY_CARE_PROVIDER_SITE_OTHER): Payer: Medicare Other | Admitting: Obstetrics and Gynecology

## 2022-03-28 ENCOUNTER — Ambulatory Visit (INDEPENDENT_AMBULATORY_CARE_PROVIDER_SITE_OTHER): Payer: Medicare Other

## 2022-03-28 VITALS — BP 120/80 | Ht 67.0 in | Wt 168.0 lb

## 2022-03-28 DIAGNOSIS — Z4889 Encounter for other specified surgical aftercare: Secondary | ICD-10-CM

## 2022-03-28 DIAGNOSIS — J4520 Mild intermittent asthma, uncomplicated: Secondary | ICD-10-CM

## 2022-03-28 DIAGNOSIS — E785 Hyperlipidemia, unspecified: Secondary | ICD-10-CM

## 2022-03-28 NOTE — Progress Notes (Signed)
Chronic Care Management Pharmacy Note  04/08/2022 Name:  Nancy Blackburn MRN:  [id number] DOB:  10-05-1946  Summary: Patient presents for CCM follow-up.   Recommendations/Changes made from today's visit: Continue current medications  Plan: CPP follow-up 3 months   Recommended Problem List Changes:  Add: Essential Hypertension Osteoporosis  CKD Stage 3a  Insomnia  Subjective: Nancy Blackburn is an 76 y.o. year old female who is a primary patient of Jerrol Banana., MD.  The CCM team was consulted for assistance with disease management and care coordination needs.    Engaged with patient by telephone for initial visit in response to provider referral for pharmacy case management and/or care coordination services.   Consent to Services:  The patient was given the following information about Chronic Care Management services today, agreed to services, and gave verbal consent: 1. CCM service includes personalized support from designated clinical staff supervised by the primary care provider, including individualized plan of care and coordination with other care providers 2. 24/7 contact phone numbers for assistance for urgent and routine care needs. 3. Service will only be billed when office clinical staff spend 20 minutes or more in a month to coordinate care. 4. Only one practitioner may furnish and bill the service in a calendar month. 5.The patient may stop CCM services at any time (effective at the end of the month) by phone call to the office staff. 6. The patient will be responsible for cost sharing (co-pay) of up to 20% of the service fee (after annual deductible is met). Patient agreed to services and consent obtained.  Patient Care Team: Jerrol Banana., MD as PCP - General (Unknown Physician Specialty) Kate Sable, MD as PCP - Cardiology (Cardiology) Marlyn Corporal Clearnce Sorrel, PA-C as Physician Assistant (Family Medicine) Birder Robson, MD as  Referring Physician (Ophthalmology) Vladimir Crofts, MD as Consulting Physician (Neurology) Bjorn Loser, MD as Consulting Physician (Urology) Newman Pies, MD as Consulting Physician (Neurosurgery) Chauncey Mann, MD as Referring Physician (Psychiatry) Richmond Campbell, MD as Consulting Physician (Gastroenterology) Neldon Labella, RN as Case Manager Clent Jacks, RN as Oncology Nurse Navigator Germaine Pomfret, [facility] (Pharmacist)  Recent office visits: 11/10/2021 Dr. Rosanna Randy MD (PCP) No Medication Changes noted, AMB Referral to [facility], Return in about 3 months   07/29/2021 Dr. Rosanna Randy MD (PCP) start prednisone 20 mg for 5 days, start  doxycycline 100 mg for 5 days, methyiprednisololne acetate 40 mg injection given,   07/21/2021 Dr. Rosanna Randy MD (PCP) No Medication Changes noted   Recent consult visits:    03/28/22: Patient presented to Dr. Gilman Schmidt (OBGYN).   02/28/22: Dr. Garen Lah increased rosuvastatin to 40 mg daily   12/21/2021 Dr. Patsey Berthold MD (Pulmonology) No Medication changes noted, Ambulatory referral to cardiology, follow-up in 3 months   12/01/2021 Dr. Chilton Si MD (Oncology) No Medication Changes noted  11/22/2021 Dr. Gilman Schmidt MD (OBGYN) No Medication Changes noted, Ambulatory referral to Hematology/Oncology   11/16/2021 Dr. Patsey Berthold MD (Pulmonology) No Medication Changes noted, Follow up on 4-6 weeks.  10/08/2021 Dr. Sharol Harness MD (Cardiology) No Medication changes noted, Follow up in 6 months.    Hospital visits: Admitted to the hospital on 10/18/2021 due to sepsis. Discharge date was 10/26/2021. Discharged from [place]?Medications Started at [facility] Discharge:?? -started Duricef 500 mg 2 tablets twice a day through 10/28/2021    Medication Changes at Hospital Discharge: -Changed amlodipine 10 mg daily   Medications Discontinued at Hospital Discharge: -Stopped  rosuvastatin   Medications that  remain the same after Hospital Discharge:??  -All other medications will remain the same.    Objective:  Lab Results  Component Value Date   CREATININE 0.89 03/03/2022   BUN 15 03/03/2022   GFRNONAA >60 03/03/2022   GFRAA 72 08/27/2020   NA 138 03/03/2022   K 3.9 03/03/2022   CALCIUM 8.9 03/03/2022   CO2 30 03/03/2022   GLUCOSE 139 (H) 03/03/2022    Lab Results  Component Value Date/Time   HGBA1C 5.4 02/26/2021 09:07 AM   HGBA1C 5.6 05/27/2016 10:15 AM    Last diabetic Eye exam: No results found for: HMDIABEYEEXA  Last diabetic Foot exam: No results found for: HMDIABFOOTEX   Lab Results  Component Value Date   CHOL 172 02/23/2022   HDL 50 02/23/2022   LDLCALC 83 02/23/2022   LDLDIRECT 186 (H) 01/18/2022   TRIG 197 (H) 02/23/2022   CHOLHDL 3.4 02/23/2022       Latest Ref Rng & Units 03/03/2022    5:24 AM 02/23/2022    9:40 AM 01/14/2022   11:16 AM  Hepatic Function  Total Protein 6.5 - 8.1 g/dL 6.0   6.5   6.4    Albumin 3.5 - 5.0 g/dL 2.9   3.5   3.9    AST 15 - 41 U/L _0 ALT 0 - 44 U/L _1 Alk Phosphatase 38 - 126 U/L 68   82   119    Total Bilirubin 0.3 - 1.2 mg/dL 0.5   0.6   0.3      Lab Results  Component Value Date/Time   TSH 1.620 04/14/2021 09:37 AM   TSH 1.330 08/27/2020 03:02 PM       Latest Ref Rng & Units 03/03/2022    5:24 AM 02/23/2022    9:40 AM 11/10/2021    3:08 PM  CBC  WBC 4.0 - 10.5 K/uL 12.2   8.8   7.5    Hemoglobin 12.0 - 15.0 g/dL 12.0   13.9   12.4    Hematocrit 36.0 - 46.0 % 37.4   44.6   37.7    Platelets 150 - 400 K/uL 245   287   430      Lab Results  Component Value Date/Time   VD25OH 18.50 (L) 10/18/2021 12:56 PM    Clinical ASCVD: No  The 10-year ASCVD risk score (Arnett DK, et al., 2019) is: 20.5%   Values used to calculate the score:     Age: 45 years     Sex: Female     Is Non-Hispanic African American: No     Diabetic: No     Tobacco smoker: No     Systolic Blood Pressure: 802  mmHg     Is BP treated: Yes     HDL Cholesterol: 50 mg/dL     Total Cholesterol: 172 mg/dL       01/13/2022    2:24 PM 11/22/2021    2:44 PM 02/16/2021   11:28 AM  Depression screen PHQ 2/9  Decreased Interest 0 0 0  Down, Depressed, Hopeless 0 1 0  PHQ - 2 Score 0 1 0  Altered sleeping  0 0  Tired, decreased energy  1 0  Change in appetite  0 0  Feeling bad or failure about yourself   0 0  Trouble concentrating  0 0  Moving slowly or fidgety/restless  0 0  Suicidal thoughts  0 0  PHQ-9 Score  2 0  Difficult doing work/chores  Not difficult at all Not difficult at all    Social History   Tobacco Use  Smoking Status Never  Smokeless Tobacco Never   BP Readings from Last 3 Encounters:  03/28/22 120/80  03/07/22 122/82  03/03/22 (!) 142/65   Pulse Readings from Last 3 Encounters:  03/03/22 77  02/24/22 72  02/18/22 93   Wt Readings from Last 3 Encounters:  03/28/22 168 lb (76.2 kg)  03/07/22 169 lb (76.7 kg)  03/02/22 172 lb (78 kg)   BMI Readings from Last 3 Encounters:  03/28/22 26.31 kg/m  03/07/22 26.47 kg/m  03/02/22 26.94 kg/m    Assessment/Interventions: Review of patient past medical history, allergies, medications, health status, including review of consultants reports, laboratory and other test data, was performed as part of comprehensive evaluation and provision of chronic care management services.   SDOH:  (Social Determinants of Health) assessments and interventions performed: Yes   SDOH Screenings   Alcohol Screen: Low Risk    Last Alcohol Screening Score (AUDIT): 1  Depression (PHQ2-9): Low Risk    PHQ-2 Score: 0  Financial Resource Strain: Low Risk    Difficulty of Paying Living Expenses: Not very hard  Food Insecurity: No Food Insecurity   Worried About Charity fundraiser in the Last Year: Never true   Ran Out of Food in the Last Year: Never true  Housing: Low Risk    Last Housing Risk Score: 0  Physical Activity: Insufficiently  Active   Days of Exercise per Week: 2 days   Minutes of Exercise per Session: 60 min  Social Connections: Moderately Isolated   Frequency of Communication with Friends and Family: More than three times a week   Frequency of Social Gatherings with Friends and Family: Once a week   Attends Religious Services: More than 4 times per year   Active Member of [employer] or Organizations: No   Attends Archivist Meetings: Never   Marital Status: Divorced  Stress: Stress Concern Present   Feeling of Stress : Rather much  Tobacco Use: Low Risk    Smoking Tobacco Use: Never   Smokeless Tobacco Use: Never   Passive Exposure: Not on file  Transportation Needs: No Transportation Needs   Lack of Transportation (Medical): No   Lack of Transportation (Non-Medical): No    CCM Care Plan  Allergies  Allergen Reactions   Anti-Inflammatory Enzyme [Nutritional Supplements] Other (See Comments)    REACTION: Due to bleeding ulcer   Cymbalta [Duloxetine Hcl] Other (See Comments)    REACTION: Urinary retention   Clarithromycin Other (See Comments)    Upsets stomach   Donepezil Hcl Other (See Comments)    Significant bradycardia causing syncope   Nsaids     GI BLEED   Ranitidine Diarrhea    severe diarrhea   Venlafaxine     severe depression    Medications Reviewed Today     Reviewed by Homero Fellers, MD (Physician) on 03/28/22 at [place] List Status: <None>   Medication Order Taking? Sig Documenting Provider Last Dose Status Informant  acetaminophen (TYLENOL) 500 MG tablet [id number] Yes Take 500 mg by mouth every 6 (six) hours as needed for moderate pain. [provider] Taking Active Self  acetaminophen (TYLENOL) 500 MG tablet [id number] Yes Take 2 tablets (1,000 mg total) by mouth every 6 (six) hours.  Homero Fellers, MD Taking Active   albuterol (VENTOLIN HFA) 108 (90 Base) MCG/ACT inhaler [id number] Yes Inhale 1-2 puffs into the lungs every 6 (six) hours as needed  for wheezing or shortness of breath. Parrett, Fonnie Mu, NP Taking Active Self  amLODipine (NORVASC) 5 MG tablet [id number] Yes Take 1 tablet (5 mg total) by mouth daily. Jerrol Banana., MD Taking Active Self  Budeson-Glycopyrrol-Formoterol [facility] AEROSPHERE) 160-9-4.8 MCG/ACT Hollie Salk [id number] Yes Inhale 2 puffs into the lungs in the morning and at bedtime. Through [place] Patient Assistance Jerrol Banana., MD Taking Active Self  busPIRone (BUSPAR) 15 MG tablet [id number] Yes Take 15 mg by mouth 2 (two) times daily. [provider] Taking Active   Calcium Carb-Cholecalciferol (CALCIUM 600+D3) 600-800 MG-UNIT TABS [id number] Yes Take 1 tablet by mouth daily. [provider] Taking Active Self  cholecalciferol (VITAMIN D) 25 MCG tablet [id number] Yes Take 1 tablet (1,000 Units total) by mouth daily. Loletha Grayer, MD Taking Active Self  citalopram (CELEXA) 40 MG tablet [id number] Yes Take 40 mg by mouth daily. [provider] Taking Active Self           Med Note (NEWCOMER Algernon Huxley, BRANDY L   Mon Aug 31, 2020  4:02 PM)    docusate sodium (COLACE) 100 MG capsule [id number] Yes Take 1 capsule (100 mg total) by mouth 2 (two) times daily. Homero Fellers, MD Taking Active   fexofenadine (ALLEGRA) 180 MG tablet [id number] Yes Take 180 mg by mouth at bedtime. [provider] Taking Active Self  hydrochlorothiazide (MICROZIDE) 12.5 MG capsule [id number] Yes TAKE 1 CAPSULE BY MOUTH EVERY MORNING Jerrol Banana., MD Taking Active   LORazepam (ATIVAN) 0.5 MG tablet [id number] Yes TAKE 1 TABLET BY MOUTH 3 TIMES DAILY AS NEEDED FOR ANXIETY Jerrol Banana., MD Taking Active Self  Menthol, Topical Analgesic, [facility] EX) [id number] Yes Apply 1 application. topically daily as needed (pain). [provider] Taking Active Self  oxyCODONE (ROXICODONE) 5 MG immediate release tablet [id number] No Take 1 tablet (5 mg total) by mouth every 8 (eight) hours  as needed.  Patient not taking: Reported on 03/28/2022   Homero Fellers, MD Not Taking Active   primidone (MYSOLINE) 50 MG tablet [id number] Yes Take 50 mg by mouth at bedtime. [provider] Taking Active Self  rosuvastatin (CRESTOR) 40 MG tablet [id number] Yes Take 1 tablet (40 mg total) by mouth at bedtime. Kate Sable, MD Taking Active   traZODone (DESYREL) 100 MG tablet [id number] Yes Take 300 mg by mouth at bedtime. [provider] Taking Active Self           Med Note Michaelle Birks, Cathe Mons A   Tue Dec 28, 2021 10:01 AM)    vitamin B-12 (CYANOCOBALAMIN) 1000 MCG tablet [id number] Yes Take 1,000 mcg by mouth daily. [provider] Taking Active Self            Patient Active Problem List   Diagnosis Date Noted   Right ovarian cyst 03/02/2022   Cyst of right ovary    Cyst of left ovary    Acute pain of right knee    Elevated LFTs    Acute metabolic encephalopathy    Sepsis due to Proteus species ([place]) 10/18/2021   Rhabdomyolysis 10/18/2021   Acute kidney injury ([place]) 10/18/2021   Leukocytosis 10/18/2021   Asthma 07/02/2021   S/P TKR (total knee replacement) using cement, right 03/09/2021   Bradycardia 07/03/2020   Postural dizziness  with presyncope 07/01/2020   Family history of colon cancer in mother 02/22/2018   Cervical spondylosis 05/04/2017   Cervical radiculopathy 05/04/2017   Family history of Cushing disease 01/12/2017   B12 deficiency 12/18/2016   Mild cognitive impairment with memory loss 12/18/2016   Parkinsonian features 12/18/2016   Vitamin D deficiency 12/18/2016   Chalazion, bilateral 06/08/2016   Absolute anemia 07/30/2015   Edema extremities 07/30/2015   Benign essential tremor 07/30/2015   Anxiety, generalized 07/30/2015   Gastro-esophageal reflux disease without esophagitis 07/30/2015   HLD (hyperlipidemia) 07/30/2015   Cannot sleep 07/30/2015   Lumbar radiculopathy 07/30/2015   Depression, major, recurrent, in  partial remission ([place]) 07/30/2015   Arthritis, degenerative 07/30/2015   Allergic rhinitis 07/30/2015   Gastroduodenal ulcer 07/30/2015   Calcium blood increased 07/30/2015   Pre-syncope 05/17/2015   Dyspnea 03/31/2015   Bleeding stomach ulcer 10/14/2013   Anemia, iron deficiency 10/14/2013   Weakness 10/14/2013   Ejection fraction    Pericardial effusion    Palpitations    Low back pain    Normal nuclear stress test     Immunization History  Administered Date(s) Administered   Fluad Quad(high Dose 65+) 10/21/2019, 10/14/2020, 08/25/2021   Influenza Inj Mdck Quad Pf 10/27/2016   Influenza Split 10/05/2011, 10/10/2012   Influenza, High Dose Seasonal PF 09/04/2014, 09/01/2015, 10/05/2017, 10/10/2018   Influenza-Unspecified 10/12/2013, 08/12/2014, 09/04/2014   PFIZER(Purple Top)SARS-COV-2 Vaccination 02/06/2020, 03/03/2020, 11/11/2020   Pneumococcal Conjugate-13 03/26/2015   Pneumococcal Polysaccharide-23 10/11/1999, 04/10/2012   Td 08/23/1999   Tdap 05/20/2019   Tetanus 05/28/2019   Zoster, Live 09/26/2008    Conditions to be addressed/monitored:  Hypertension, Hyperlipidemia, GERD, Asthma, Chronic Kidney Disease, Anxiety, and Osteoporosis  Care Plan : General Pharmacy (Adult)  Updates made by Germaine Pomfret, RPH since 04/08/2022 12:00 AM     Problem: Hypertension, Hyperlipidemia, GERD, Asthma, Chronic Kidney Disease, Anxiety, and Osteoporosis   Priority: High     Long-Range Goal: Patient-Specific Goal   Start Date: 12/28/2021  Expected End Date: 12/28/2022  This Visit's Progress: On track  Recent Progress: On track  Priority: High  Note:   Current Barriers:  Unable to independently afford treatment regimen Unable to achieve control of cholesterol  Suboptimal therapeutic regimen for HTN, Osteoporosis, depression  Pharmacist Clinical Goal(s):  Patient will verbalize ability to afford treatment regimen achieve control of hypertension as evidenced by BP less  than 140/90 adhere to plan to optimize therapeutic regimen for HLD, osteoporosis as evidenced by report of adherence to recommended medication management changes through collaboration with PharmD and provider.   Interventions: 1:1 collaboration with Jerrol Banana., MD regarding development and update of comprehensive plan of care as evidenced by provider attestation and co-signature Inter-disciplinary care team collaboration (see longitudinal plan of care) Comprehensive medication review performed; medication list updated in electronic medical record  Hypertension (BP goal <140/90) -Controlled -Current treatment: Amlodipine 5 mg daily (Started Apr 2022) HCTZ 12.5 mg daily (Started Nov 2022) -Medications previously tried: Metoprolol (bradycardia)   -Denies hypotensive/hypertensive symptoms -Continue current medications  Hyperlipidemia: (LDL goal < 100) -Controlled -Current treatment: Rosuvastatin 40 mg daily  -Medications previously tried: Rosuvastatin -Continue current medications   Asthma (Goal: control symptoms and prevent exacerbations) -Controlled -Current treatment  Ventolin HFA 1-2 puffs every 6 hours as needed: Appropriate, Effective, Safe, Accessible Breztri 1 puff twice daily: Appropriate, Effective, Safe, Query accessible -Current allergy treatment  Fexofenadine 180 mg nightly: Appropriate, Effective, Safe, Accessible -Medications previously tried: NA  -Currently has 3 inhalers  supply.  -Increased shortness of breath with exertion (grocery store),  -Pulmonary function testing: FEV1/FVC 81% (2017) -Exacerbations requiring treatment in last 6 months: None -Patient reports consistent use of maintenance inhaler -Frequency of rescue inhaler use: Used once in past week, but sometimes  -Counseled on Proper inhaler technique; -Recommended to continue current medication -START PAP for Breztri  Anxiety (Goal: Minimize anxiety symtoms) -Controlled -managed by Dr.  Nicolasa Ducking  -Current treatment: Buspirone 15 mg twice daily: Appropriate, Effective, Safe, Accessible Citalopram 40 mg daily: Appropriate, Effective, Query Safe Lorazepam 0.5 mg three times daily as needed: Appropriate, Effective, Safe, Accessible -Medications previously tried/failed: Cymbalta, Effexor -More anxiety related to daughter's hospitalization and now using lorazepam daily  -Recommended decreasing citalopram to 20 mg daily due to risk of Qtc prolongation. Patient to discuss at psychiatry follow-up in March.   Insomnia (Goal: Improve sleep quality) -Uncontrolled -Current treatment  Trazodone 100 mg 3 tablets nightly: Appropriate, Query effective  -Medications previously tried: Melatonin -Difficulty falling asleep. In bed by 9:30 PM, won't fall asleep until 2-3 am. Wakes up 3 times nightly to use restroom. Normally wakes up around 11 am.   -Counseled on proper sleep hygiene.  -Recommended to continue current medication  Osteoporosis (Goal Prevent bone fractures) -Not ideally controlled -History of fracture of sacrum/coccyx -Last DEXA Scan: 02/07/18   T-Score femoral neck: NA  T-Score total hip: -2.4  T-Score lumbar spine: NA  T-Score forearm radius: -1.4 -Patient is a candidate for pharmacologic treatment due to history of fracture.  -Current treatment  Calcium 600 mg daily: Appropriate, Effective, Safe, Accessible Vitamin D 1000 units daily: Appropriate, Query effective  -Medications previously tried: NA  -Patient likely candidate for treatment given fracture history. Did not discuss with patient today.  -Recommend rechecking DEXA.  -Recommend starting ergocalciferol 50,000 units weekly  Patient Goals/Self-Care Activities Patient will:  - check blood pressure 2-3 times weekly, document, and provide at future appointments  Follow Up Plan: Telephone follow up appointment with care management team member scheduled for:  06/27/2022 at 3:45 PM    Medication Assistance:  Application for Breztri  medication assistance program. in process.  Anticipated assistance start date TBD.  See plan of care for additional detail.  Compliance/Adherence/Medication fill history: Care Gaps: Shingrix  Star-Rating Drugs: None ID  Patient's preferred pharmacy is:  [facility], [place] [address] Phone: [phone] Fax: [phone] - [place], [address] [place] [address] Phone: [phone] Fax: [phone]  Uses pill box? Yes Pt endorses 100% compliance  We discussed: Current pharmacy is preferred with insurance plan and patient is satisfied with pharmacy services Patient decided to: Continue current medication management strategy  Care Plan and Follow Up Patient Decision:  Patient agrees to Care Plan and Follow-up.  Plan: Telephone follow up appointment with care management team member scheduled for:  06/27/2022 at 3:45 PM  Junius Argyle, PharmD, [place], [place] [phone]

## 2022-03-28 NOTE — Progress Notes (Signed)
?  Postoperative Follow-up ?Patient presents post op from  [facility] Robot-assisted Laparoscopic Bilateral Salpingo-oophorectomy  for  enlarged right ovary , 1 month ago. ? ?Subjective: ?She reports that since the surgery she has been feeling well. Pain is controlled without meditations. She is not having problems with eating, bowel movement or urination. ?Activity: normal activities of daily living.  ? ?Objective: ?BP 120/80   Ht '5\' 7"'$  (1.702 m)   Wt 168 lb (76.2 kg)   BMI 26.31 kg/m?  ?Physical Exam ?Constitutional:   ?   Appearance: Normal appearance. She is well-developed.  ?HENT:  ?   Head: Normocephalic and atraumatic.  ?Eyes:  ?   Extraocular Movements: Extraocular movements intact.  ?   Pupils: Pupils are equal, round, and reactive to light.  ?Neck:  ?   Thyroid: No thyromegaly.  ?Cardiovascular:  ?   Rate and Rhythm: Normal rate and regular rhythm.  ?   Heart sounds: Normal heart sounds.  ?Pulmonary:  ?   Effort: Pulmonary effort is normal.  ?   Breath sounds: Normal breath sounds.  ?Abdominal:  ?   General: Bowel sounds are normal. There is no distension.  ?   Palpations: Abdomen is soft. There is no mass.  ?   Comments: Incisions are well healed, clean dry and intact  ?Musculoskeletal:  ?   Cervical back: Neck supple.  ?Neurological:  ?   Mental Status: She is alert and oriented to person, place, and time.  ?Skin: ?   General: Skin is warm and dry.  ?Psychiatric:     ?   Behavior: Behavior normal.     ?   Thought Content: Thought content normal.     ?   Judgment: Judgment normal.  ?Vitals reviewed.  ? ? ?Assessment: ?s/p :  [facility] Robot-assisted Laparoscopic Bilateral Salpingo-oophorectomy stable ? ?Plan: ? ?Patient has done well after surgery with no apparent complications.  ?  ?I have discussed the post-operative course to date, and the expected progress moving forward.  The patient understands what complications to be concerned about.  I will see the patient in routine follow up, or sooner if needed.    ? ?Activity plan: No restriction.   ? ? ?Adrian Prows MD, ?[facility] ?03/28/2022 ?10:27 AM ? ? ? ?

## 2022-04-05 DIAGNOSIS — N3941 Urge incontinence: Secondary | ICD-10-CM | POA: Diagnosis not present

## 2022-04-08 NOTE — Patient Instructions (Signed)
Visit Information ?It was great speaking with you today!  Please let me know if you have any questions about our visit. ? ? Goals Addressed   ? ?  ?  ?  ?  ? This Visit's Progress  ?  Track and Manage My Blood Pressure-Hypertension   On track  ?  Timeframe:  Long-Range Goal ?Priority:  High ?Start Date: 12/28/2021                            ?Expected End Date: 12/28/2022                     ? ?Follow Up within 90 days ?  ?- check blood pressure 3 times per week  ?  ?Why is this important?   ?You won't feel high blood pressure, but it can still hurt your blood vessels.  ?High blood pressure can cause heart or kidney problems. It can also cause a stroke.  ?Making lifestyle changes like losing a little weight or eating less salt will help.  ?Checking your blood pressure at home and at different times of the day can help to control blood pressure.  ?If the doctor prescribes medicine remember to take it the way the doctor ordered.  ?Call the office if you cannot afford the medicine or if there are questions about it.   ?  ?Notes:  ?  ? ?  ? ? ?Patient Care Plan: RN Care Management Plan of Care  ?  ? ?Problem Identified: Asthma, HTN, HLD and Fall Risk   ?  ? ?Long-Range Goal: Disease Progression Prevented or Minimized   ?Start Date: 11/25/2021  ?Expected End Date: 02/23/2022  ?Priority: High  ?Note:   ?Current Barriers:  ?Chronic Disease Management support and education needs related to HTN, HLD, Asthma, and Fall Risk ? ?RNCM Clinical Goal(s):  ?Patient will demonstrate adherence to prescribed treatment plan for HTN, HLD, Asthma, and Fall Risk through collaboration with the provider, RN Care manager and the care management team. ? ?Interventions: ?1:1 collaboration with primary care provider regarding development and update of comprehensive plan of care as evidenced by provider attestation and co-signature ?Inter-disciplinary care team collaboration (see longitudinal plan of care) ?Evaluation of current treatment plan  related to  self management and patient's adherence to plan as established by provider ? ? ?Asthma: (Status:New goal.) Long Term Goal ?Discussed medications and plan regarding Asthma management and exacerbation prevention. ?Advised to monitor symptoms and manage Asthma triggers ?Discussed effectiveness of current inhalers. Advised to continue using as prescribed and notify the care management team with concerns regarding prescription cost. ?Advised to notify a provider if experiencing moderate symptoms for 48 hours without improvement ?Advised to utilize infection prevention strategies to reduce risk of respiratory infection ?Discussed the importance of adequate rest and management of fatigue with Asthma ? ? ?Hyperlipidemia Interventions:  (Status:  New goal.) Long Term Goal ?Lab Results  ?Component Value Date  ? CHOL 200 (H) 04/12/2019  ? HDL 59 04/12/2019  ? LDLCALC 110 (H) 04/12/2019  ? TRIG 154 (H) 04/12/2019  ? CHOLHDL 3.4 04/12/2019  ?  ?Medications reviewed ?Reviewed established cholesterol goals  ?Discussed importance of regular laboratory monitoring as prescribed ?Reviewed importance of limiting foods high in cholesterol ? ?Hypertension Interventions:  (Status:  New goal.) Long Term Goal ?Last practice recorded BP readings:  ?BP Readings from Last 3 Encounters:  ?11/22/21 124/71  ?11/16/21 120/60  ?11/10/21 123/79  ?Most recent  eGFR/CrCl:  ?Lab Results  ?Component Value Date  ? EGFR 54 (L) 11/10/2021  ?  No components found for: CRCL ? ?Reviewed medications. Encouraged to continue taking as prescribed and notify provider if unable to tolerate prescribed regimen.  ?Provided information regarding established blood pressure parameters along with indications for notifying a provider. Encouraged to continue monitoring and recording reading. ?Discussed compliance with recommended cardiac prudent diet. Encouraged to continue monitoring sodium intake. Encouraged to continue reading nutrition labels and avoid highly  processed foods when possible. ?Reviewed s/sx of heart attack, stroke and worsening symptoms that require immediate medical attention. ? ? ?Patient Goals/Self-Care Activities: ?Take all medications as prescribed ?Attend all scheduled provider appointments ?Call pharmacy for medication refills 3-7 days in advance of running out of medications ?Perform all self care activities independently  ?Call provider office for new concerns or questions  ? ? ?Follow Up Plan:   ?Will follow up next month ?  ? ?Task: Alleviate Barriers to Hypertension Treatment   ?  ? ?Patient Care Plan: General Pharmacy (Adult)  ?  ? ?Problem Identified: Hypertension, Hyperlipidemia, GERD, Asthma, Chronic Kidney Disease, Anxiety, and Osteoporosis   ?Priority: High  ?  ? ?Long-Range Goal: Patient-Specific Goal   ?Start Date: 12/28/2021  ?Expected End Date: 12/28/2022  ?This Visit's Progress: On track  ?Recent Progress: On track  ?Priority: High  ?Note:   ?Current Barriers:  ?Unable to independently afford treatment regimen ?Unable to achieve control of cholesterol  ?Suboptimal therapeutic regimen for HTN, Osteoporosis, depression ? ?Pharmacist Clinical Goal(s):  ?Patient will verbalize ability to afford treatment regimen ?achieve control of hypertension as evidenced by BP less than 140/90 ?adhere to plan to optimize therapeutic regimen for HLD, osteoporosis as evidenced by report of adherence to recommended medication management changes through collaboration with PharmD and provider.  ? ?Interventions: ?1:1 collaboration with Jerrol Banana., MD regarding development and update of comprehensive plan of care as evidenced by provider attestation and co-signature ?Inter-disciplinary care team collaboration (see longitudinal plan of care) ?Comprehensive medication review performed; medication list updated in electronic medical record ? ?Hypertension (BP goal <140/90) ?-Controlled ?-Current treatment: ?Amlodipine 5 mg daily (Started Apr  2022) ?HCTZ 12.5 mg daily (Started Nov 2022) ?-Medications previously tried: Metoprolol (bradycardia)   ?-Denies hypotensive/hypertensive symptoms ?-Continue current medications ? ?Hyperlipidemia: (LDL goal < 100) ?-Controlled ?-Current treatment: ?Rosuvastatin 40 mg daily  ?-Medications previously tried: Rosuvastatin ?-Continue current medications  ? ?Asthma (Goal: control symptoms and prevent exacerbations) ?-Controlled ?-Current treatment  ?Ventolin HFA 1-2 puffs every 6 hours as needed: Appropriate, Effective, Safe, Accessible ?Breztri 1 puff twice daily: Appropriate, Effective, Safe, Query accessible ?-Current allergy treatment  ?Fexofenadine 180 mg nightly: Appropriate, Effective, Safe, Accessible ?-Medications previously tried: NA  ?-Currently has 3 inhalers supply.  ?-Increased shortness of breath with exertion (grocery store),  ?-Pulmonary function testing: FEV1/FVC 81% (2017) ?-Exacerbations requiring treatment in last 6 months: None ?-Patient reports consistent use of maintenance inhaler ?-Frequency of rescue inhaler use: Used once in past week, but sometimes  ?-Counseled on Proper inhaler technique; ?-Recommended to continue current medication ?-START PAP for Breztri ? ?Anxiety (Goal: Minimize anxiety symtoms) ?-Controlled ?-managed by Dr. Nicolasa Ducking  ?-Current treatment: ?Buspirone 15 mg twice daily: Appropriate, Effective, Safe, Accessible ?Citalopram 40 mg daily: Appropriate, Effective, Query Safe ?Lorazepam 0.5 mg three times daily as needed: Appropriate, Effective, Safe, Accessible ?-Medications previously tried/failed: Cymbalta, Effexor ?-More anxiety related to daughter's hospitalization and now using lorazepam daily  ?-Recommended decreasing citalopram to 20 mg daily due to risk  of Qtc prolongation. Patient to discuss at psychiatry follow-up in March.  ? ?Insomnia (Goal: Improve sleep quality) ?-Uncontrolled ?-Current treatment  ?Trazodone 100 mg 3 tablets nightly: Appropriate, Query effective   ?-Medications previously tried: Melatonin ?-Difficulty falling asleep. In bed by 9:30 PM, won't fall asleep until 2-3 am. Wakes up 3 times nightly to use restroom. Normally wakes up around 11 am.   ?-Counseled on proper s

## 2022-04-09 DIAGNOSIS — G4733 Obstructive sleep apnea (adult) (pediatric): Secondary | ICD-10-CM | POA: Diagnosis not present

## 2022-04-10 DIAGNOSIS — J4520 Mild intermittent asthma, uncomplicated: Secondary | ICD-10-CM | POA: Diagnosis not present

## 2022-04-10 DIAGNOSIS — E785 Hyperlipidemia, unspecified: Secondary | ICD-10-CM

## 2022-04-11 DIAGNOSIS — G4733 Obstructive sleep apnea (adult) (pediatric): Secondary | ICD-10-CM | POA: Diagnosis not present

## 2022-04-14 DIAGNOSIS — F331 Major depressive disorder, recurrent, moderate: Secondary | ICD-10-CM | POA: Diagnosis not present

## 2022-04-14 DIAGNOSIS — F411 Generalized anxiety disorder: Secondary | ICD-10-CM | POA: Diagnosis not present

## 2022-04-14 DIAGNOSIS — F5105 Insomnia due to other mental disorder: Secondary | ICD-10-CM | POA: Diagnosis not present

## 2022-04-18 DIAGNOSIS — M25561 Pain in right knee: Secondary | ICD-10-CM | POA: Diagnosis not present

## 2022-04-19 ENCOUNTER — Telehealth: Payer: Self-pay

## 2022-04-19 NOTE — Progress Notes (Signed)
? ? ?Chronic Care Management ?Pharmacy Assistant  ? ?Name: Nancy Blackburn  MRN: [id number] DOB: 11-21-1946 ? ?Reason for Encounter: Hypertension Disease State Call. ? ?Recent office visits:  ?No recent office visit ? ?Recent consult visits:  ?No recent consult visit ? ?Hospital visits:  ?None in previous 6 months ? ?Medications: ?Outpatient Encounter Medications as of 04/19/2022  ?Medication Sig  ? acetaminophen (TYLENOL) 500 MG tablet Take 500 mg by mouth every 6 (six) hours as needed for moderate pain.  ? acetaminophen (TYLENOL) 500 MG tablet Take 2 tablets (1,000 mg total) by mouth every 6 (six) hours.  ? albuterol (VENTOLIN HFA) 108 (90 Base) MCG/ACT inhaler Inhale 1-2 puffs into the lungs every 6 (six) hours as needed for wheezing or shortness of breath.  ? amLODipine (NORVASC) 5 MG tablet Take 1 tablet (5 mg total) by mouth daily.  ? Budeson-Glycopyrrol-Formoterol (BREZTRI AEROSPHERE) 160-9-4.8 MCG/ACT AERO Inhale 2 puffs into the lungs in the morning and at bedtime. Through [place] Patient Assistance  ? busPIRone (BUSPAR) 15 MG tablet Take 15 mg by mouth 2 (two) times daily.  ? Calcium Carb-Cholecalciferol (CALCIUM 600+D3) 600-800 MG-UNIT TABS Take 1 tablet by mouth daily.  ? cholecalciferol (VITAMIN D) 25 MCG tablet Take 1 tablet (1,000 Units total) by mouth daily.  ? citalopram (CELEXA) 40 MG tablet Take 40 mg by mouth daily.  ? docusate sodium (COLACE) 100 MG capsule Take 1 capsule (100 mg total) by mouth 2 (two) times daily.  ? fexofenadine (ALLEGRA) 180 MG tablet Take 180 mg by mouth at bedtime.  ? hydrochlorothiazide (MICROZIDE) 12.5 MG capsule TAKE 1 CAPSULE BY MOUTH EVERY MORNING  ? LORazepam (ATIVAN) 0.5 MG tablet TAKE 1 TABLET BY MOUTH 3 TIMES DAILY AS NEEDED FOR ANXIETY  ? Menthol, Topical Analgesic, (BIOFREEZE EX) Apply 1 application. topically daily as needed (pain).  ? oxyCODONE (ROXICODONE) 5 MG immediate release tablet Take 1 tablet (5 mg total) by mouth every 8 (eight) hours as needed.  (Patient not taking: Reported on 03/28/2022)  ? primidone (MYSOLINE) 50 MG tablet Take 50 mg by mouth at bedtime.  ? rosuvastatin (CRESTOR) 40 MG tablet Take 1 tablet (40 mg total) by mouth at bedtime.  ? traZODone (DESYREL) 100 MG tablet Take 300 mg by mouth at bedtime.  ? vitamin B-12 (CYANOCOBALAMIN) 1000 MCG tablet Take 1,000 mcg by mouth daily.  ? ?No facility-administered encounter medications on file as of 04/19/2022.  ? ? ?Care Gaps: ?Shingrix Vaccine ?COVID-19 Vaccine ?  ?Star Rating Drugs: ?Rosuvastatin 20 mg last filled 02/28/2022 90 day supply at [facility]. ?  ?Medication Fill Gaps: ?None ID  ? ?Reviewed chart prior to disease state call. Spoke with patient regarding BP ? ?Recent Office Vitals: ?BP Readings from Last 3 Encounters:  ?03/28/22 120/80  ?03/07/22 122/82  ?03/03/22 (!) 142/65  ? ?Pulse Readings from Last 3 Encounters:  ?03/03/22 77  ?02/24/22 72  ?02/18/22 93  ?  ?Wt Readings from Last 3 Encounters:  ?03/28/22 168 lb (76.2 kg)  ?03/07/22 169 lb (76.7 kg)  ?03/02/22 172 lb (78 kg)  ?  ? ?Kidney Function ?Lab Results  ?Component Value Date/Time  ? CREATININE 0.89 03/03/2022 05:24 AM  ? CREATININE 0.97 02/23/2022 09:40 AM  ? CREATININE 1.02 (H) 11/23/2017 02:35 PM  ? CREATININE 1.28 10/28/2014 05:55 PM  ? CREATININE 0.80 10/06/2013 05:18 AM  ? GFRNONAA >60 03/03/2022 05:24 AM  ? GFRNONAA 55 (L) 11/23/2017 02:35 PM  ? GFRAA 72 08/27/2020 03:02 PM  ? QIWLN 98  11/23/2017 02:35 PM  ? ? ? ?  Latest Ref Rng & Units 03/03/2022  ?  5:24 AM 02/23/2022  ?  9:40 AM 01/14/2022  ? 11:16 AM  ?BMP  ?Glucose 70 - 99 mg/dL 139   97   110    ?BUN 8 - 23 mg/dL '15   17   17    '$ ?Creatinine 0.44 - 1.00 mg/dL 0.89   0.97   1.12    ?BUN/Creat Ratio 12 - 28   15    ?Sodium 135 - 145 mmol/L 138   140   140    ?Potassium 3.5 - 5.1 mmol/L 3.9   3.3   4.0    ?Chloride 98 - 111 mmol/L 101   104   101    ?CO2 22 - 32 mmol/L '30   31   28    '$ ?Calcium 8.9 - 10.3 mg/dL 8.9   8.9   9.2    ? ? ?Current antihypertensive regimen:   ?Amlodipine 5 mg daily  ?HCTZ 12.5 mg daily ? ?Patient denies any headaches,lightheadedness, or dizziness. ? ?How often are you checking your Blood Pressure? infrequently ? ?Current home BP readings:  ?Patient reports she does not check her blood pressure at home that much because every time she has a doctor appointment her blood pressure is "normal". ? ?What recent interventions/DTPs have been made by any provider to improve Blood Pressure control since last CPP Visit: None ID ? ?Any recent hospitalizations or ED visits since last visit with CPP? No ? ?What diet changes have been made to improve Blood Pressure Control?  ?Patient denies any changes to her diet. ? ?What exercise is being done to improve your Blood Pressure Control?  ?Patient denies any changes at this time. ? ?Adherence Review: ?Is the patient currently on ACE/ARB medication? No ?Does the patient have >5 day gap between last estimated fill dates? No ? ?Telephone follow up appointment with care management team member scheduled for:  06/27/2022 at 3:45 PM ? ?Bessie Kellihan,CPA ?Clinical Pharmacist Assistant ?[phone]  ? ? ?

## 2022-05-16 ENCOUNTER — Telehealth: Payer: Self-pay

## 2022-05-16 NOTE — Progress Notes (Signed)
Chronic Care Management Pharmacy Assistant   Name: Nancy Blackburn  MRN: [id number] DOB: 1946/10/07  Reason for Encounter:Hypertension Disease State Call.  Recent office visits:  No recent office visit   Recent consult visits:  No recent consult visit   Hospital visits:  None in previous 6 months  Medications: Outpatient Encounter Medications as of 05/16/2022  Medication Sig   acetaminophen (TYLENOL) 500 MG tablet Take 500 mg by mouth every 6 (six) hours as needed for moderate pain.   acetaminophen (TYLENOL) 500 MG tablet Take 2 tablets (1,000 mg total) by mouth every 6 (six) hours.   albuterol (VENTOLIN HFA) 108 (90 Base) MCG/ACT inhaler Inhale 1-2 puffs into the lungs every 6 (six) hours as needed for wheezing or shortness of breath.   amLODipine (NORVASC) 5 MG tablet Take 1 tablet (5 mg total) by mouth daily.   Budeson-Glycopyrrol-Formoterol (BREZTRI AEROSPHERE) 160-9-4.8 MCG/ACT AERO Inhale 2 puffs into the lungs in the morning and at bedtime. Through [place] Patient Assistance   busPIRone (BUSPAR) 15 MG tablet Take 15 mg by mouth 2 (two) times daily.   Calcium Carb-Cholecalciferol (CALCIUM 600+D3) 600-800 MG-UNIT TABS Take 1 tablet by mouth daily.   cholecalciferol (VITAMIN D) 25 MCG tablet Take 1 tablet (1,000 Units total) by mouth daily.   citalopram (CELEXA) 40 MG tablet Take 40 mg by mouth daily.   docusate sodium (COLACE) 100 MG capsule Take 1 capsule (100 mg total) by mouth 2 (two) times daily.   fexofenadine (ALLEGRA) 180 MG tablet Take 180 mg by mouth at bedtime.   hydrochlorothiazide (MICROZIDE) 12.5 MG capsule TAKE 1 CAPSULE BY MOUTH EVERY MORNING   LORazepam (ATIVAN) 0.5 MG tablet TAKE 1 TABLET BY MOUTH 3 TIMES DAILY AS NEEDED FOR ANXIETY   Menthol, Topical Analgesic, (BIOFREEZE EX) Apply 1 application. topically daily as needed (pain).   oxyCODONE (ROXICODONE) 5 MG immediate release tablet Take 1 tablet (5 mg total) by mouth every 8 (eight) hours as needed.  (Patient not taking: Reported on 03/28/2022)   primidone (MYSOLINE) 50 MG tablet Take 50 mg by mouth at bedtime.   rosuvastatin (CRESTOR) 40 MG tablet Take 1 tablet (40 mg total) by mouth at bedtime.   traZODone (DESYREL) 100 MG tablet Take 300 mg by mouth at bedtime.   vitamin B-12 (CYANOCOBALAMIN) 1000 MCG tablet Take 1,000 mcg by mouth daily.   No facility-administered encounter medications on file as of 05/16/2022.    Care Gaps: Shingrix Vaccine COVID-19 Vaccine   Star Rating Drugs: Rosuvastatin 20 mg last filled 02/28/2022 90 day supply at [facility].   Medication Fill Gaps: None ID   Reviewed chart prior to disease state call. Spoke with patient regarding BP  Recent Office Vitals: BP Readings from Last 3 Encounters:  03/28/22 120/80  03/07/22 122/82  03/03/22 (!) 142/65   Pulse Readings from Last 3 Encounters:  03/03/22 77  02/24/22 72  02/18/22 93    Wt Readings from Last 3 Encounters:  03/28/22 168 lb (76.2 kg)  03/07/22 169 lb (76.7 kg)  03/02/22 172 lb (78 kg)     Kidney Function Lab Results  Component Value Date/Time   CREATININE 0.89 03/03/2022 05:24 AM   CREATININE 0.97 02/23/2022 09:40 AM   CREATININE 1.02 (H) 11/23/2017 02:35 PM   CREATININE 1.28 10/28/2014 05:55 PM   CREATININE 0.80 10/06/2013 05:18 AM   GFRNONAA >60 03/03/2022 05:24 AM   GFRNONAA 55 (L) 11/23/2017 02:35 PM   GFRAA 72 08/27/2020 03:02 PM   GFRAA  64 11/23/2017 02:35 PM       Latest Ref Rng & Units 03/03/2022    5:24 AM 02/23/2022    9:40 AM 01/14/2022   11:16 AM  BMP  Glucose 70 - 99 mg/dL 139   97   110    BUN 8 - 23 mg/dL '15   17   17    '$ Creatinine 0.44 - 1.00 mg/dL 0.89   0.97   1.12    BUN/Creat Ratio 12 - 28   15    Sodium 135 - 145 mmol/L 138   140   140    Potassium 3.5 - 5.1 mmol/L 3.9   3.3   4.0    Chloride 98 - 111 mmol/L 101   104   101    CO2 22 - 32 mmol/L '30   31   28    '$ Calcium 8.9 - 10.3 mg/dL 8.9   8.9   9.2      Current antihypertensive regimen:   Amlodipine 5 mg daily  HCTZ 12.5 mg daily  Patient denies any headaches,lightheadedness, or dizziness.  How often are you checking your Blood Pressure? infrequently  Current home BP readings:  Patient reports she does not check her blood pressure at home that much because every time she has a doctor appointment her blood pressure is "normal".  What recent interventions/DTPs have been made by any provider to improve Blood Pressure control since last CPP Visit: None ID  Any recent hospitalizations or ED visits since last visit with CPP? No What diet changes have been made to improve Blood Pressure Control?  Patient denies any changes to her diet. What exercise is being done to improve your Blood Pressure Control?  Patient denies any changes at this time.  Patient denies any issue/questions for the clinical pharmacist.Patient denies any needs of refills of her medications at this time.  Adherence Review: Is the patient currently on ACE/ARB medication? No Does the patient have >5 day gap between last estimated fill dates? No   Telephone follow up appointment with care management team member scheduled for:  06/27/2022 at 3:45 PM

## 2022-05-19 DIAGNOSIS — M25561 Pain in right knee: Secondary | ICD-10-CM | POA: Diagnosis not present

## 2022-05-19 DIAGNOSIS — M25661 Stiffness of right knee, not elsewhere classified: Secondary | ICD-10-CM | POA: Diagnosis not present

## 2022-06-07 ENCOUNTER — Other Ambulatory Visit: Payer: Self-pay | Admitting: Family Medicine

## 2022-06-23 ENCOUNTER — Telehealth: Payer: Self-pay

## 2022-06-23 NOTE — Progress Notes (Signed)
Chronic Care Management APPOINTMENT REMINDER   Nancy Blackburn was reminded to have all medications, supplements and any blood glucose and blood pressure readings available for review with Junius Argyle, Pharm. D, at her telephone visit on 06/27/2022 at 3:45 pm.   Patient confirm appointment.  [facility] Pharmacist Assistant [phone]

## 2022-06-27 ENCOUNTER — Ambulatory Visit (INDEPENDENT_AMBULATORY_CARE_PROVIDER_SITE_OTHER): Payer: Medicare Other

## 2022-06-27 DIAGNOSIS — E785 Hyperlipidemia, unspecified: Secondary | ICD-10-CM

## 2022-06-27 DIAGNOSIS — J4520 Mild intermittent asthma, uncomplicated: Secondary | ICD-10-CM

## 2022-06-27 NOTE — Progress Notes (Signed)
Chronic Care Management Pharmacy Note  07/05/2022 Name:  Nancy Blackburn MRN:  [id number] DOB:  Jan 15, 1946  Summary: Patient presents for CCM follow-up.   Recommendations/Changes made from today's visit: Continue current medications  Plan: CPP follow-up 3 months   Recommended Problem List Changes:  Add: Essential Hypertension Osteoporosis  CKD Stage 3a  Insomnia  Subjective: Nancy Blackburn is an 76 y.o. year old female who is a primary patient of Jerrol Banana., MD.  The CCM team was consulted for assistance with disease management and care coordination needs.    Engaged with patient by telephone for follow up visit in response to provider referral for pharmacy case management and/or care coordination services.   Consent to Services:  The patient was given information about Chronic Care Management services, agreed to services, and gave verbal consent prior to initiation of services.  Please see initial visit note for detailed documentation.   Patient Care Team: Jerrol Banana., MD as PCP - General (Unknown Physician Specialty) Kate Sable, MD as PCP - Cardiology (Cardiology) Marlyn Corporal Clearnce Sorrel, PA-C as Physician Assistant (Family Medicine) Birder Robson, MD as Referring Physician (Ophthalmology) Vladimir Crofts, MD as Consulting Physician (Neurology) Bjorn Loser, MD as Consulting Physician (Urology) Newman Pies, MD as Consulting Physician (Neurosurgery) Chauncey Mann, MD as Referring Physician (Psychiatry) Richmond Campbell, MD as Consulting Physician (Gastroenterology) Neldon Labella, RN as Case Manager Clent Jacks, RN as Oncology Nurse Navigator Germaine Pomfret, [facility] (Pharmacist)  Recent office visits: 11/10/2021 Dr. Rosanna Randy MD (PCP) No Medication Changes noted, AMB Referral to [facility], Return in about 3 months   Recent consult visits:  03/28/22: Patient presented to Dr. Gilman Schmidt (OBGYN).    02/28/22: Dr. Garen Lah increased rosuvastatin to 40 mg daily   12/21/2021 Dr. Patsey Berthold MD (Pulmonology) No Medication changes noted, Ambulatory referral to cardiology, follow-up in 3 months   Hospital visits: Admitted to the hospital on 10/18/2021 due to sepsis.   Objective:  Lab Results  Component Value Date   CREATININE 0.89 03/03/2022   BUN 15 03/03/2022   GFRNONAA >60 03/03/2022   GFRAA 72 08/27/2020   NA 138 03/03/2022   K 3.9 03/03/2022   CALCIUM 8.9 03/03/2022   CO2 30 03/03/2022   GLUCOSE 139 (H) 03/03/2022    Lab Results  Component Value Date/Time   HGBA1C 5.4 02/26/2021 09:07 AM   HGBA1C 5.6 05/27/2016 10:15 AM    Last diabetic Eye exam: No results found for: "HMDIABEYEEXA"  Last diabetic Foot exam: No results found for: "HMDIABFOOTEX"   Lab Results  Component Value Date   CHOL 172 02/23/2022   HDL 50 02/23/2022   LDLCALC 83 02/23/2022   LDLDIRECT 186 (H) 01/18/2022   TRIG 197 (H) 02/23/2022   CHOLHDL 3.4 02/23/2022       Latest Ref Rng & Units 03/03/2022    5:24 AM 02/23/2022    9:40 AM 01/14/2022   11:16 AM  Hepatic Function  Total Protein 6.5 - 8.1 g/dL 6.0  6.5  6.4   Albumin 3.5 - 5.0 g/dL 2.9  3.5  3.9   AST 15 - 41 U/L 17  15  11    ALT 0 - 44 U/L 9  11  10    Alk Phosphatase 38 - 126 U/L 68  82  119   Total Bilirubin 0.3 - 1.2 mg/dL 0.5  0.6  0.3     Lab Results  Component Value Date/Time   TSH 1.620 04/14/2021 09:37 AM  TSH 1.330 08/27/2020 03:02 PM       Latest Ref Rng & Units 03/03/2022    5:24 AM 02/23/2022    9:40 AM 11/10/2021    3:08 PM  CBC  WBC 4.0 - 10.5 K/uL 12.2  8.8  7.5   Hemoglobin 12.0 - 15.0 g/dL 12.0  13.9  12.4   Hematocrit 36.0 - 46.0 % 37.4  44.6  37.7   Platelets 150 - 400 K/uL 245  287  430     Lab Results  Component Value Date/Time   VD25OH 18.50 (L) 10/18/2021 12:56 PM    Clinical ASCVD: No  The 10-year ASCVD risk score (Arnett DK, et al., 2019) is: 14.7%   Values used to calculate the score:      Age: 31 years     Sex: Female     Is Non-Hispanic African American: No     Diabetic: No     Tobacco smoker: No     Systolic Blood Pressure: 315 mmHg     Is BP treated: Yes     HDL Cholesterol: 50 mg/dL     Total Cholesterol: 172 mg/dL       01/13/2022    2:24 PM 11/22/2021    2:44 PM 02/16/2021   11:28 AM  Depression screen PHQ 2/9  Decreased Interest 0 0 0  Down, Depressed, Hopeless 0 1 0  PHQ - 2 Score 0 1 0  Altered sleeping  0 0  Tired, decreased energy  1 0  Change in appetite  0 0  Feeling bad or failure about yourself   0 0  Trouble concentrating  0 0  Moving slowly or fidgety/restless  0 0  Suicidal thoughts  0 0  PHQ-9 Score  2 0  Difficult doing work/chores  Not difficult at all Not difficult at all    Social History   Tobacco Use  Smoking Status Never  Smokeless Tobacco Never   BP Readings from Last 3 Encounters:  07/01/22 100/60  03/28/22 120/80  03/07/22 122/82   Pulse Readings from Last 3 Encounters:  07/01/22 65  03/03/22 77  02/24/22 72   Wt Readings from Last 3 Encounters:  07/01/22 171 lb 8 oz (77.8 kg)  03/28/22 168 lb (76.2 kg)  03/07/22 169 lb (76.7 kg)   BMI Readings from Last 3 Encounters:  07/01/22 26.86 kg/m  03/28/22 26.31 kg/m  03/07/22 26.47 kg/m    Assessment/Interventions: Review of patient past medical history, allergies, medications, health status, including review of consultants reports, laboratory and other test data, was performed as part of comprehensive evaluation and provision of chronic care management services.   SDOH:  (Social Determinants of Health) assessments and interventions performed: Yes  -Last DEXA Scan: 02/07/18   T-Score femoral neck: NA  T-Score total hip: -2.4  T-Score lumbar spine: NA  T-Score forearm radius: -1.4  SDOH Screenings   Alcohol Screen: Low Risk  (01/13/2022)   Alcohol Screen    Last Alcohol Screening Score (AUDIT): 1  Depression (PHQ2-9): Low Risk  (01/13/2022)   Depression (PHQ2-9)     PHQ-2 Score: 0  Financial Resource Strain: Low Risk  (01/13/2022)   Overall Financial Resource Strain (CARDIA)    Difficulty of Paying Living Expenses: Not very hard  Recent Concern: Financial Resource Strain - High Risk (12/28/2021)   Overall Financial Resource Strain (CARDIA)    Difficulty of Paying Living Expenses: Hard  Food Insecurity: No Food Insecurity (01/13/2022)   Hunger Vital Sign  Worried About Charity fundraiser in the Last Year: Never true    [place] in the Last Year: Never true  Housing: Low Risk  (01/13/2022)   Housing    Last Housing Risk Score: 0  Physical Activity: Insufficiently Active (01/13/2022)   Exercise Vital Sign    Days of Exercise per Week: 2 days    Minutes of Exercise per Session: 60 min  Social Connections: Moderately Isolated (01/13/2022)   Social Connection and Isolation Panel [NHANES]    Frequency of Communication with Friends and Family: More than three times a week    Frequency of Social Gatherings with Friends and Family: Once a week    Attends Religious Services: More than 4 times per year    Active Member of [employer] or Organizations: No    Attends Archivist Meetings: Never    Marital Status: Divorced  Stress: Stress Concern Present (01/13/2022)   [employer] [place]    Feeling of Stress : Rather much  Tobacco Use: Low Risk  (07/01/2022)   Patient History    Smoking Tobacco Use: Never    Smokeless Tobacco Use: Never    Passive Exposure: Not on file  Transportation Needs: No Transportation Needs (01/13/2022)   PRAPARE - Transportation    Lack of Transportation (Medical): No    Lack of Transportation (Non-Medical): No    CCM Care Plan  Allergies  Allergen Reactions   Anti-Inflammatory Enzyme [Nutritional Supplements] Other (See Comments)    REACTION: Due to bleeding ulcer   Cymbalta [Duloxetine Hcl] Other (See Comments)    REACTION: Urinary retention   Clarithromycin  Other (See Comments)    Upsets stomach   Clarithromycin     GI upset   Donepezil Hcl Other (See Comments)    Significant bradycardia causing syncope   Nsaids     GI BLEED   Ranitidine Diarrhea    severe diarrhea   Venlafaxine     severe depression    Medications Reviewed Today     Reviewed by Kate Sable, MD (Physician) on 07/01/22 at 1125  Med List Status: <None>   Medication Order Taking? Sig Documenting Provider Last Dose Status Informant  acetaminophen (TYLENOL) 500 MG tablet [id number] No Take 500 mg by mouth every 6 (six) hours as needed for moderate pain.  Patient not taking: Reported on 07/01/2022   [provider] Not Taking Active Self  acetaminophen (TYLENOL) 500 MG tablet [id number] Yes Take 2 tablets (1,000 mg total) by mouth every 6 (six) hours. Homero Fellers, MD Taking Active   albuterol (VENTOLIN HFA) 108 (90 Base) MCG/ACT inhaler [id number] Yes Inhale 1-2 puffs into the lungs every 6 (six) hours as needed for wheezing or shortness of breath. Parrett, Fonnie Mu, NP Taking Active Self  amLODipine (NORVASC) 5 MG tablet [id number] Yes Take 1 tablet (5 mg total) by mouth daily. Jerrol Banana., MD Taking Active Self  Budeson-Glycopyrrol-Formoterol [facility] AEROSPHERE) 160-9-4.8 MCG/ACT Hollie Salk [id number] Yes Inhale 2 puffs into the lungs in the morning and at bedtime. Through [place] Patient Assistance Jerrol Banana., MD Taking Active Self  busPIRone (BUSPAR) 15 MG tablet [id number] Yes Take 15 mg by mouth 2 (two) times daily. [provider] Taking Active   Calcium Carb-Cholecalciferol (CALCIUM 600+D3) 600-800 MG-UNIT TABS [id number] Yes Take 1 tablet by mouth daily. [provider] Taking Active Self  cholecalciferol (VITAMIN D) 25 MCG tablet [id number] Yes Take 1 tablet (1,000 Units  total) by mouth daily. Loletha Grayer, MD Taking Active Self  citalopram (CELEXA) 40 MG tablet [id number] Yes Take 40 mg by mouth daily. [provider] Taking Active Self           Med Note (NEWCOMER Algernon Huxley, BRANDY L   Mon Aug 31, 2020  4:02 PM)    docusate sodium (COLACE) 100 MG capsule [id number] No Take 1 capsule (100 mg total) by mouth 2 (two) times daily.  Patient not taking: Reported on 07/01/2022   Homero Fellers, MD Not Taking Active   donepezil (ARICEPT) 5 MG tablet [id number] Yes Take 5 mg by mouth at bedtime. [provider] Taking Active   fexofenadine (ALLEGRA) 180 MG tablet [id number] Yes Take 180 mg by mouth at bedtime. [provider] Taking Active Self  LORazepam (ATIVAN) 0.5 MG tablet [id number] Yes TAKE 1 TABLET BY MOUTH 3 TIMES DAILY AS NEEDED FOR ANXIETY Jerrol Banana., MD Taking Active   Melatonin 5 MG CHEW [id number] Yes Chew 10 mg by mouth at bedtime. [provider] Taking Active   Menthol, Topical Analgesic, (BIOFREEZE EX) [id number] Yes Apply 1 application. topically daily as needed (pain). [provider] Taking Active Self  oxyCODONE (ROXICODONE) 5 MG immediate release tablet [id number] No Take 1 tablet (5 mg total) by mouth every 8 (eight) hours as needed.  Patient not taking: Reported on 03/28/2022   Homero Fellers, MD Not Taking Active   primidone (MYSOLINE) 50 MG tablet [id number] Yes Take 50 mg by mouth at bedtime. [provider] Taking Active Self  rosuvastatin (CRESTOR) 40 MG tablet [id number] Yes Take 1 tablet (40 mg total) by mouth at bedtime. Kate Sable, MD Taking Active   traZODone (DESYREL) 100 MG tablet [id number] No Take 300 mg by mouth at bedtime.  Patient not taking: Reported on 07/01/2022   [provider] Not Taking Active Self           Med Note Michaelle Birks, Cathe Mons A   Tue Dec 28, 2021 10:01 AM)    vitamin B-12 (CYANOCOBALAMIN) 1000 MCG tablet [id number] Yes Take 1,000 mcg by mouth daily. [provider] Taking Active Self            Patient Active Problem List   Diagnosis Date Noted   Right  ovarian cyst 03/02/2022   Cyst of right ovary    Cyst of left ovary    Acute pain of right knee    Elevated LFTs    Acute metabolic encephalopathy    Sepsis due to Proteus species ([place]) 10/18/2021   Rhabdomyolysis 10/18/2021   Acute kidney injury ([place]) 10/18/2021   Leukocytosis 10/18/2021   Asthma 07/02/2021   S/P TKR (total knee replacement) using cement, right 03/09/2021   Bradycardia 07/03/2020   Postural dizziness with presyncope 07/01/2020   Family history of colon cancer in mother 02/22/2018   Cervical spondylosis 05/04/2017   Cervical radiculopathy 05/04/2017   Family history of Cushing disease 01/12/2017   B12 deficiency 12/18/2016   Mild cognitive impairment with memory loss 12/18/2016   Parkinsonian features 12/18/2016   Vitamin D deficiency 12/18/2016   Chalazion, bilateral 06/08/2016   Absolute anemia 07/30/2015   Edema extremities 07/30/2015   Benign essential tremor 07/30/2015   Anxiety, generalized 07/30/2015   Gastro-esophageal reflux disease without esophagitis 07/30/2015   HLD (hyperlipidemia) 07/30/2015   Cannot sleep 07/30/2015   Lumbar radiculopathy 07/30/2015   Depression, major, recurrent, in partial remission ([place]) 07/30/2015   Arthritis, degenerative 07/30/2015  Allergic rhinitis 07/30/2015   Gastroduodenal ulcer 07/30/2015   Calcium blood increased 07/30/2015   Pre-syncope 05/17/2015   Dyspnea 03/31/2015   Bleeding stomach ulcer 10/14/2013   Anemia, iron deficiency 10/14/2013   Weakness 10/14/2013   Ejection fraction    Pericardial effusion    Palpitations    Low back pain    Normal nuclear stress test     Immunization History  Administered Date(s) Administered   Fluad Quad(high Dose 65+) 10/21/2019, 10/14/2020, 08/25/2021   Influenza Inj Mdck Quad Pf 10/27/2016   Influenza Split 10/05/2011, 10/10/2012   Influenza, High Dose Seasonal PF 09/04/2014, 09/01/2015, 10/05/2017, 10/10/2018   Influenza-Unspecified 10/12/2013, 08/12/2014,  09/04/2014   PFIZER(Purple Top)SARS-COV-2 Vaccination 02/06/2020, 03/03/2020, 11/11/2020   Pneumococcal Conjugate-13 03/26/2015   Pneumococcal Polysaccharide-23 10/11/1999, 04/10/2012   Td 08/23/1999   Tdap 05/20/2019   Tetanus 05/28/2019   Zoster, Live 09/26/2008    Conditions to be addressed/monitored:  Hypertension, Hyperlipidemia, GERD, Asthma, Chronic Kidney Disease, Anxiety, and Osteoporosis  Care Plan : General Pharmacy (Adult)  Updates made by Germaine Pomfret, RPH since 07/05/2022 12:00 AM     Problem: Hypertension, Hyperlipidemia, GERD, Asthma, Chronic Kidney Disease, Anxiety, and Osteoporosis   Priority: High     Long-Range Goal: Patient-Specific Goal   Start Date: 12/28/2021  Expected End Date: 12/28/2022  This Visit's Progress: On track  Recent Progress: On track  Priority: High  Note:   Current Barriers:  Unable to independently afford treatment regimen Unable to achieve control of cholesterol  Suboptimal therapeutic regimen for HTN, Osteoporosis, depression  Pharmacist Clinical Goal(s):  Patient will verbalize ability to afford treatment regimen achieve control of hypertension as evidenced by BP less than 140/90 adhere to plan to optimize therapeutic regimen for HLD, osteoporosis as evidenced by report of adherence to recommended medication management changes through collaboration with PharmD and provider.   Interventions: 1:1 collaboration with Jerrol Banana., MD regarding development and update of comprehensive plan of care as evidenced by provider attestation and co-signature Inter-disciplinary care team collaboration (see longitudinal plan of care) Comprehensive medication review performed; medication list updated in electronic medical record  Hypertension (BP goal <140/90) -Controlled -Current treatment: Amlodipine 5 mg daily (Started Apr 2022) HCTZ 12.5 mg daily (Started Nov 2022) -Medications previously tried: Metoprolol (bradycardia)    -Denies hypotensive/hypertensive symptoms -Continue current medications  Hyperlipidemia: (LDL goal < 100) -Controlled -Current treatment: Rosuvastatin 40 mg daily  -Medications previously tried: Rosuvastatin -Continue current medications   Asthma (Goal: control symptoms and prevent exacerbations) -Controlled -Current treatment  Ventolin HFA 1-2 puffs every 6 hours as needed: Appropriate, Effective, Safe, Accessible Breztri 1 puff twice daily: Appropriate, Effective, Safe, Query accessible -Current allergy treatment  Fexofenadine 180 mg nightly: Appropriate, Effective, Safe, Accessible -Medications previously tried: NA  -Currently has 3 inhalers supply.  In the past four weeks, has the patient had:  Daytime asthma symptoms more than twice weekly?   No Any instance of night waking due to asthma?   No Utilized albuterol reliever for symptoms more than twice weekly? No - apx. 1 month  Any activity limitation due to asthma?    No -Increased shortness of breath with exertion (grocery store),  -Pulmonary function testing: FEV1/FVC 81% (2017) -Exacerbations requiring treatment in last 6 months: None -Patient reports consistent use of maintenance inhaler -Continue current medications  Anxiety (Goal: Minimize anxiety symtoms) -Controlled -managed by Dr. Nicolasa Ducking  -Current treatment: Buspirone 15 mg twice daily: Appropriate, Effective, Safe, Accessible Citalopram 40 mg daily: Appropriate, Effective, Query Safe  Lorazepam 0.5 mg three times daily as needed: Appropriate, Effective, Safe, Accessible -Medications previously tried/failed: Cymbalta, Effexor -Continue current medications   Insomnia (Goal: Improve sleep quality) -Uncontrolled -Managed by Dr. Nicolasa Ducking, needs to reschedule follow-up.  -Current treatment  Olly's Sleep 3 gummies nightly (4.5 mg melatonin)  -Medications previously tried: Melatonin, Trazodone  -Difficulty falling asleep. In bed by 9:30 PM, won't fall asleep until 2-3  am. Wakes up 3 times nightly to use restroom. Normally wakes up around 11 am.   -Counseled on proper sleep hygiene.  -Did not tolerate CPAP, hurt nose.  -Recommended to continue current medication  Osteoporosis (Goal Prevent bone fractures) -Not ideally controlled -History of fracture of sacrum/coccyx -Patient is a candidate for pharmacologic treatment due to history of fracture.  -Current treatment  Calcium 600 mg daily: Appropriate, Effective, Safe, Accessible Vitamin D 1000 units daily: Appropriate, Query effective  -Medications previously tried: NA  -Continue current medications   Patient Goals/Self-Care Activities Patient will:  - check blood pressure 2-3 times weekly, document, and provide at future appointments  Follow Up Plan: Telephone follow up appointment with care management team member scheduled for:  10/24/2022 at 3:00 PM     Medication Assistance: Application for Breztri  medication assistance program. in process.  Anticipated assistance start date TBD.  See plan of care for additional detail.  Compliance/Adherence/Medication fill history: Care Gaps: Shingrix  Star-Rating Drugs: None ID  Patient's preferred pharmacy is:  [facility] [place] [address] Phone: [phone] Fax: [phone] - [place], [address] [place] [address] Phone: [phone] Fax: [phone]  Uses pill box? Yes Pt endorses 100% compliance  We discussed: Current pharmacy is preferred with insurance plan and patient is satisfied with pharmacy services Patient decided to: Continue current medication management strategy  Care Plan and Follow Up Patient Decision:  Patient agrees to Care Plan and Follow-up.  Plan: Telephone follow up appointment with care management team member scheduled for:  10/24/2022 at 3:00 PM  Junius Argyle, PharmD,  [place], [place] Pharmacist Practitioner  [facility] [phone]

## 2022-06-29 DIAGNOSIS — H43813 Vitreous degeneration, bilateral: Secondary | ICD-10-CM | POA: Diagnosis not present

## 2022-06-29 DIAGNOSIS — H26492 Other secondary cataract, left eye: Secondary | ICD-10-CM | POA: Diagnosis not present

## 2022-06-29 DIAGNOSIS — D485 Neoplasm of uncertain behavior of skin: Secondary | ICD-10-CM | POA: Diagnosis not present

## 2022-07-01 ENCOUNTER — Encounter: Payer: Self-pay | Admitting: Cardiology

## 2022-07-01 ENCOUNTER — Ambulatory Visit (INDEPENDENT_AMBULATORY_CARE_PROVIDER_SITE_OTHER): Payer: Medicare Other | Admitting: Cardiology

## 2022-07-01 VITALS — BP 100/60 | HR 65 | Ht 67.0 in | Wt 171.5 lb

## 2022-07-01 DIAGNOSIS — I2510 Atherosclerotic heart disease of native coronary artery without angina pectoris: Secondary | ICD-10-CM

## 2022-07-01 DIAGNOSIS — E7800 Pure hypercholesterolemia, unspecified: Secondary | ICD-10-CM

## 2022-07-01 DIAGNOSIS — I10 Essential (primary) hypertension: Secondary | ICD-10-CM | POA: Diagnosis not present

## 2022-07-01 NOTE — Patient Instructions (Signed)
Medication Instructions:   Your physician recommends that you continue on your current medications as directed. Please refer to the Current Medication list given to you today.  *If you need a refill on your cardiac medications before your next appointment, please call your pharmacy*   Lab Work:  Your physician recommends that you return for a FASTING lipid profile: IN 3 MONTHS  - You will need to be fasting. Please do not have anything to eat or drink after midnight the morning you have the lab work. You may only have water or black coffee with no cream or sugar.   - Please go to the [facility]. You will check in at the front desk to the right as you walk into the atrium. Valet Parking is offered if needed. - No appointment needed. You may go any day between 7 am and 6 pm.      Follow-Up: At [facility], you and your health needs are our priority.  As part of our continuing mission to provide you with exceptional heart care, we have created designated Provider Care Teams.  These Care Teams include your primary Cardiologist (physician) and Advanced Practice Providers (APPs -  Physician Assistants and Nurse Practitioners) who all work together to provide you with the care you need, when you need it.  We recommend signing up for the patient portal called "MyChart".  Sign up information is provided on this After Visit Summary.  MyChart is used to connect with patients for Virtual Visits (Telemedicine).  Patients are able to view lab/test results, encounter notes, upcoming appointments, etc.  Non-urgent messages can be sent to your provider as well.   To learn more about what you can do with MyChart, go to [email or web address].    Your next appointment:   6 month(s)  The format for your next appointment:   In Person  Provider:   You may see Kate Sable, MD or one of the following Advanced Practice Providers on your designated Care Team:   Murray Hodgkins, NP Christell Faith, PA-C Cadence Kathlen Mody, [place]    Other Instructions   Important Information About Sugar

## 2022-07-01 NOTE — Progress Notes (Signed)
Cardiology Office Note:    Date:  07/01/2022   ID:  Nancy, Blackburn January 17, 1946, MRN [id number]  PCP:  Jerrol Banana., MD  [facility] Cardiologist:  Kate Sable, MD  [place] Electrophysiologist:  None   Referring MD: Jerrol Banana.,*   Chief Complaint  Patient presents with   3 month follow up     Patient c/o shortness of breath at times. Medications reviewed by the patient verbally.     History of Present Illness:    Nancy Blackburn is a 76 y.o. female with a hx of CAD (cor ca2+ on chest CT), hyperlipidemia, Parkinson's disease, mild cognitive impairment, who presents for follow-up.  Being seen for follow-up of hyperlipidemia, coronary artery calcification.  Recent lipid panel showed elevated cholesterol levels although improved from prior.  Crestor increased to 40 mg daily.  Denies chest pain , occasional shortness of breath, tolerating Crestor with no adverse effects.  Recent Myoview showed no ischemia.  Denies dizziness, syncope or presyncope.  Prior notes Lexiscan Myoview 09/2021 no significant ischemia. Echocardiogram 09/2021 EF 60 to 65%, impaired relaxation. She has a history of bleeding ulcers, asa, nsaids avoidance recommended by GI Patient was evaluated in the hospital from 7/21-7/23 due to dizziness, presyncope and bradycardia.  Prior to admission, she notes episodes of dizziness and passing out.  Saw her primary care on day of admission and was noted to be bradycardic with heart rate 38.  EKG in the hospital on 7/21 showed marked bradycardia heart rate 37.  Patient was also hypotensive with BP 91/58 upon chart review.  She used to be on propanolol and Aricept which were discontinued.  Echo on 07/03/2020 showed normal systolic function, EF 60 to 65%, impaired relaxation.   Her heart rates improved with subsequent EKG showing sinus bradycardia heart rate 55, and subsequently stayed consistently in the 70s after discontinuation of  propanolol and Aricept.   Past Medical History:  Diagnosis Date   Adnexal cyst 10/18/2021   a.) CT abdomen: large cystic mass in the LEFT hemipelvis measuring 8.5 cm. b.) Pelvis US 10/22/2021: adnexal cyst measuring 8.0 x 8.2 x 7.9 (volume 270 cm). c.) Pelvis US 01/17/2022: cystic mass measured 8.2 x 8.0 x 8.3 (volume 290 cm).   Anxiety    a.) on BZO (lorazepam) therapy PRN   Arthritis    Asthma    B12 deficiency    Benign essential tremor    Bleeding gastric ulcer    Bradycardia    Cervical cancer (HCC) 1978   Chronic sinus infection    Coronary artery disease    a.) CT chest 04/02/2021: coronary calcifications. b.) Lexi 01/21/2022: calcifications near [facility].   Depression    Diastolic dysfunction    a.) TTE 05/24/2012: EF 55-65%; triv MR, mild PR; G1DD. b.) TTE 05/24/2012: EF 55-65%; mild LVH; triv MR, mild TR/PR; G1DD. c.) TTE 04/15/2015: EF 60-65%; mild LVH; mild LA dilation; mild PR, triv TR; G2DD. d.) TTE 07/03/20 and 09/28/2021: EF 60-65%; mild LVH; G1DD.   Fracture, sacrum/coccyx ([place])    GERD (gastroesophageal reflux disease)    Hiatal hernia    History of kidney stones    Hyperlipidemia    Hypertension    Insomnia    a.) takes trazodone PRN   Low back pain    Mild dementia ([place])    Nasal polyps    Normal nuclear stress test    a. 07/2005 Nl nuc stress test.   Osteopenia  Osteoporosis    Palpitations    a. June 2013:  Event recorder normal sinus rhythm with rare PVCs - ? symptomatic PVC's.   Parkinsonian features    a.) resting tremors; LEFT hemibody Parkinsonism. b.) takes primidone   Pericardial effusion    a.) TTE 05/24/2012: small. b.) TTE 04/15/2015: trivial. c.) TTE 09/28/2021: small.   Pneumonia    PONV (postoperative nausea and vomiting)    Thyroid nodule    Vitamin D deficiency     Past Surgical History:  Procedure Laterality Date   BACK SURGERY  08/2010   BLADDER SUSPENSION  2003   bone spur  2009   removed from right collar bone area and  partial collar bone removed   CATARACT EXTRACTION W/ INTRAOCULAR LENS  IMPLANT, BILATERAL     EYE SURGERY     LUMBAR FUSION  2011, 2012   ROBOTIC ASSISTED SALPINGO OOPHERECTOMY Bilateral 03/02/2022   Procedure: XI ROBOTIC ASSISTED SALPINGO OOPHORECTOMY;  Surgeon: Homero Fellers, MD;  Location: [facility] ORS;  Service: Gynecology;  Laterality: Bilateral;   SHOULDER ARTHROSCOPY Right 2012   THYROIDECTOMY, PARTIAL  1976   TONSILLECTOMY     as child   TOTAL KNEE ARTHROPLASTY Right 03/09/2021   Procedure: Right TOTAL KNEE ARTHROPLASTY;  Surgeon: Thornton Park, MD;  Location: [facility] ORS;  Service: Orthopedics;  Laterality: Right;   TUBAL LIGATION  1977   VAGINAL HYSTERECTOMY  1978   d/t cervical cancer    Current Medications: Current Meds  Medication Sig   acetaminophen (TYLENOL) 500 MG tablet Take 2 tablets (1,000 mg total) by mouth every 6 (six) hours.   albuterol (VENTOLIN HFA) 108 (90 Base) MCG/ACT inhaler Inhale 1-2 puffs into the lungs every 6 (six) hours as needed for wheezing or shortness of breath.   amLODipine (NORVASC) 5 MG tablet Take 1 tablet (5 mg total) by mouth daily.   Budeson-Glycopyrrol-Formoterol (BREZTRI AEROSPHERE) 160-9-4.8 MCG/ACT AERO Inhale 2 puffs into the lungs in the morning and at bedtime. Through [place] Patient Assistance   busPIRone (BUSPAR) 15 MG tablet Take 15 mg by mouth 2 (two) times daily.   Calcium Carb-Cholecalciferol (CALCIUM 600+D3) 600-800 MG-UNIT TABS Take 1 tablet by mouth daily.   cholecalciferol (VITAMIN D) 25 MCG tablet Take 1 tablet (1,000 Units total) by mouth daily.   citalopram (CELEXA) 40 MG tablet Take 40 mg by mouth daily.   donepezil (ARICEPT) 5 MG tablet Take 5 mg by mouth at bedtime.   fexofenadine (ALLEGRA) 180 MG tablet Take 180 mg by mouth at bedtime.   LORazepam (ATIVAN) 0.5 MG tablet TAKE 1 TABLET BY MOUTH 3 TIMES DAILY AS NEEDED FOR ANXIETY   Melatonin 5 MG CHEW Chew 10 mg by mouth at bedtime.   Menthol, Topical Analgesic,  (BIOFREEZE EX) Apply 1 application. topically daily as needed (pain).   primidone (MYSOLINE) 50 MG tablet Take 50 mg by mouth at bedtime.   rosuvastatin (CRESTOR) 40 MG tablet Take 1 tablet (40 mg total) by mouth at bedtime.   vitamin B-12 (CYANOCOBALAMIN) 1000 MCG tablet Take 1,000 mcg by mouth daily.     Allergies:   Anti-inflammatory enzyme [nutritional supplements], Cymbalta [duloxetine hcl], Clarithromycin, Clarithromycin, Donepezil hcl, Nsaids, Ranitidine, and Venlafaxine   Social History   Socioeconomic History   Marital status: Divorced    Spouse name: Not on file   Number of children: 2   Years of education: Not on file   Highest education level: 12th grade  Occupational History   Occupation: retired  Tobacco  Use   Smoking status: Never   Smokeless tobacco: Never  Vaping Use   Vaping Use: Never used  Substance and Sexual Activity   Alcohol use: Yes    Comment: 1 glass of wine or mixed drink every 2-3 months   Drug use: No   Sexual activity: Never  Other Topics Concern   Not on file  Social History Narrative   Grandson lives with her   Social Determinants of Health   Financial Resource Strain: Low Risk  (01/13/2022)   Overall Financial Resource Strain (CARDIA)    Difficulty of Paying Living Expenses: Not very hard  Recent Concern: Financial Resource Strain - High Risk (12/28/2021)   Overall Financial Resource Strain (CARDIA)    Difficulty of Paying Living Expenses: Hard  Food Insecurity: No Food Insecurity (01/13/2022)   Hunger Vital Sign    Worried About Running Out of Food in the Last Year: Never true    Ran Out of Food in the Last Year: Never true  Transportation Needs: No Transportation Needs (01/13/2022)   PRAPARE - Hydrologist (Medical): No    Lack of Transportation (Non-Medical): No  Physical Activity: Insufficiently Active (01/13/2022)   Exercise Vital Sign    Days of Exercise per Week: 2 days    Minutes of Exercise per Session:  60 min  Stress: Stress Concern Present (01/13/2022)   Amber    Feeling of Stress : Rather much  Social Connections: Moderately Isolated (01/13/2022)   Social Connection and Isolation Panel [NHANES]    Frequency of Communication with Friends and Family: More than three times a week    Frequency of Social Gatherings with Friends and Family: Once a week    Attends Religious Services: More than 4 times per year    Active Member of [employer] or Organizations: No    Attends Music therapist: Never    Marital Status: Divorced     Family History: The patient's family history includes Angina in her mother; Atrial fibrillation in her brother; Breast cancer in her cousin; COPD in her father; Cancer in her daughter; Colon cancer in her mother; Cushing syndrome in her daughter; Heart disease in her father and mother; Heart murmur in her mother. There is no history of Hypotension, Malignant hyperthermia, or Pseudochol deficiency.  ROS:   Please see the history of present illness.     All other systems reviewed and are negative.  EKGs/Labs/Other Studies Reviewed:    The following studies were reviewed today:   EKG:  EKG is ordered today.  EKG shows normal sinus rhythm.  Recent Labs: 07/29/2021: NT-Pro BNP 293 10/18/2021: B Natriuretic Peptide 229.4; Magnesium 1.9 03/03/2022: ALT 9; BUN 15; Creatinine, Ser 0.89; Hemoglobin 12.0; Platelets 245; Potassium 3.9; Sodium 138  Recent Lipid Panel    Component Value Date/Time   CHOL 172 02/23/2022 0940   CHOL 280 (H) 01/18/2022 1552   TRIG 197 (H) 02/23/2022 0940   HDL 50 02/23/2022 0940   HDL 45 01/18/2022 1552   CHOLHDL 3.4 02/23/2022 0940   VLDL 39 02/23/2022 0940   LDLCALC 83 02/23/2022 0940   LDLCALC 183 (H) 01/18/2022 1552   LDLDIRECT 186 (H) 01/18/2022 1552    Physical Exam:    VS:  BP 100/60 (BP Location: Left Arm, Patient Position: Sitting, Cuff Size: Normal)    Pulse 65   Ht '5\' 7"'$  (1.702 m)   Wt 171 lb 8 oz (77.8  kg)   SpO2 96%   BMI 26.86 kg/m     Wt Readings from Last 3 Encounters:  07/01/22 171 lb 8 oz (77.8 kg)  03/28/22 168 lb (76.2 kg)  03/07/22 169 lb (76.7 kg)     GEN:  Well nourished, well developed in no acute distress, occasional tremors HEENT: Normal NECK: No JVD; No carotid bruits CARDIAC: RRR, no murmurs, rubs, gallops RESPIRATORY:  Clear to auscultation without rales, wheezing or rhonchi  ABDOMEN: Soft, non-tender, non-distended MUSCULOSKELETAL:  No edema; No deformity  SKIN: Warm and dry NEUROLOGIC:  Alert and oriented x 3, resting tremors noted PSYCHIATRIC:  Normal affect   ASSESSMENT:    1. Coronary artery disease involving native heart, unspecified vessel or lesion type, unspecified whether angina present   2. Pure hypercholesterolemia   3. Primary hypertension    PLAN:    In order of problems listed above:  Coronary artery calcification, history of bleeding ulcers.  NSAIDs, aspirin not recommended by GI.  EF 60 to 65%, Myoview showed no significant ischemia.  Continue Crestor 40 mg. hyperlipidemia, last lipid panel, improved cholesterol levels, triglycerides still elevated, Crestor increased.  Continue Crestor 40 mg daily.  Repeat lipid panel in 3 months. Blood pressure low normal, denies dizziness or presyncope.  Consider reducing dose of amlodipine or stopping if patient becomes symptomatic or hypotensive  Follow-up in 6 months   Medication Adjustments/Labs and Tests Ordered: Current medicines are reviewed at length with the patient today.  Concerns regarding medicines are outlined above.  Orders Placed This Encounter  Procedures   Lipid panel   EKG 12-Lead    No orders of the defined types were placed in this encounter.    Patient Instructions  Medication Instructions:   Your physician recommends that you continue on your current medications as directed. Please refer to the Current Medication  list given to you today.  *If you need a refill on your cardiac medications before your next appointment, please call your pharmacy*   Lab Work:  Your physician recommends that you return for a FASTING lipid profile: IN 3 MONTHS  - You will need to be fasting. Please do not have anything to eat or drink after midnight the morning you have the lab work. You may only have water or black coffee with no cream or sugar.   - Please go to the [facility]. You will check in at the front desk to the right as you walk into the atrium. Valet Parking is offered if needed. - No appointment needed. You may go any day between 7 am and 6 pm.      Follow-Up: At [facility], you and your health needs are our priority.  As part of our continuing mission to provide you with exceptional heart care, we have created designated Provider Care Teams.  These Care Teams include your primary Cardiologist (physician) and Advanced Practice Providers (APPs -  Physician Assistants and Nurse Practitioners) who all work together to provide you with the care you need, when you need it.  We recommend signing up for the patient portal called "MyChart".  Sign up information is provided on this After Visit Summary.  MyChart is used to connect with patients for Virtual Visits (Telemedicine).  Patients are able to view lab/test results, encounter notes, upcoming appointments, etc.  Non-urgent messages can be sent to your provider as well.   To learn more about what you can do with MyChart, go to [email or web address].    Your  next appointment:   6 month(s)  The format for your next appointment:   In Person  Provider:   You may see Kate Sable, MD or one of the following Advanced Practice Providers on your designated Care Team:   Murray Hodgkins, NP Christell Faith, PA-C Cadence Kathlen Mody, [place]    Other Instructions   Important Information About Sugar         Signed, Kate Sable, MD  07/01/2022  11:46 AM    [place]

## 2022-07-05 NOTE — Patient Instructions (Signed)
Visit Information It was great speaking with you today!  Please let me know if you have any questions about our visit.   Goals Addressed             This Visit's Progress    Track and Manage My Blood Pressure-Hypertension   On track    Timeframe:  Long-Range Goal Priority:  High Start Date: 12/28/2021                            Expected End Date: 12/28/2022                      Follow Up within 90 days   - check blood pressure 3 times per week    Why is this important?   You won't feel high blood pressure, but it can still hurt your blood vessels.  High blood pressure can cause heart or kidney problems. It can also cause a stroke.  Making lifestyle changes like losing a little weight or eating less salt will help.  Checking your blood pressure at home and at different times of the day can help to control blood pressure.  If the doctor prescribes medicine remember to take it the way the doctor ordered.  Call the office if you cannot afford the medicine or if there are questions about it.     Notes:         Patient Care Plan: RN Care Management Plan of Care     Problem Identified: Asthma, HTN, HLD and Fall Risk      Long-Range Goal: Disease Progression Prevented or Minimized   Start Date: 11/25/2021  Expected End Date: 02/23/2022  Priority: High  Note:   Current Barriers:  Chronic Disease Management support and education needs related to HTN, HLD, Asthma, and Fall Risk  RNCM Clinical Goal(s):  Patient will demonstrate adherence to prescribed treatment plan for HTN, HLD, Asthma, and Fall Risk through collaboration with the provider, RN Care manager and the care management team.  Interventions: 1:1 collaboration with primary care provider regarding development and update of comprehensive plan of care as evidenced by provider attestation and co-signature Inter-disciplinary care team collaboration (see longitudinal plan of care) Evaluation of current treatment plan  related to  self management and patient's adherence to plan as established by provider   Asthma: (Status:New goal.) Long Term Goal Discussed medications and plan regarding Asthma management and exacerbation prevention. Advised to monitor symptoms and manage Asthma triggers Discussed effectiveness of current inhalers. Advised to continue using as prescribed and notify the care management team with concerns regarding prescription cost. Advised to notify a provider if experiencing moderate symptoms for 48 hours without improvement Advised to utilize infection prevention strategies to reduce risk of respiratory infection Discussed the importance of adequate rest and management of fatigue with Asthma   Hyperlipidemia Interventions:  (Status:  New goal.) Long Term Goal Lab Results  Component Value Date   CHOL 200 (H) 04/12/2019   HDL 59 04/12/2019   LDLCALC 110 (H) 04/12/2019   TRIG 154 (H) 04/12/2019   CHOLHDL 3.4 04/12/2019    Medications reviewed Reviewed established cholesterol goals  Discussed importance of regular laboratory monitoring as prescribed Reviewed importance of limiting foods high in cholesterol  Hypertension Interventions:  (Status:  New goal.) Long Term Goal Last practice recorded BP readings:  BP Readings from Last 3 Encounters:  11/22/21 124/71  11/16/21 120/60  11/10/21 123/79  Most recent  eGFR/CrCl:  Lab Results  Component Value Date   EGFR 54 (L) 11/10/2021    No components found for: CRCL  Reviewed medications. Encouraged to continue taking as prescribed and notify provider if unable to tolerate prescribed regimen.  Provided information regarding established blood pressure parameters along with indications for notifying a provider. Encouraged to continue monitoring and recording reading. Discussed compliance with recommended cardiac prudent diet. Encouraged to continue monitoring sodium intake. Encouraged to continue reading nutrition labels and avoid highly  processed foods when possible. Reviewed s/sx of heart attack, stroke and worsening symptoms that require immediate medical attention.   Patient Goals/Self-Care Activities: Take all medications as prescribed Attend all scheduled provider appointments Call pharmacy for medication refills 3-7 days in advance of running out of medications Perform all self care activities independently  Call provider office for new concerns or questions    Follow Up Plan:   Will follow up next month    Task: Alleviate Barriers to Hypertension Treatment      Patient Care Plan: General Pharmacy (Adult)     Problem Identified: Hypertension, Hyperlipidemia, GERD, Asthma, Chronic Kidney Disease, Anxiety, and Osteoporosis   Priority: High     Long-Range Goal: Patient-Specific Goal   Start Date: 12/28/2021  Expected End Date: 12/28/2022  This Visit's Progress: On track  Recent Progress: On track  Priority: High  Note:   Current Barriers:  Unable to independently afford treatment regimen Unable to achieve control of cholesterol  Suboptimal therapeutic regimen for HTN, Osteoporosis, depression  Pharmacist Clinical Goal(s):  Patient will verbalize ability to afford treatment regimen achieve control of hypertension as evidenced by BP less than 140/90 adhere to plan to optimize therapeutic regimen for HLD, osteoporosis as evidenced by report of adherence to recommended medication management changes through collaboration with PharmD and provider.   Interventions: 1:1 collaboration with Jerrol Banana., MD regarding development and update of comprehensive plan of care as evidenced by provider attestation and co-signature Inter-disciplinary care team collaboration (see longitudinal plan of care) Comprehensive medication review performed; medication list updated in electronic medical record  Hypertension (BP goal <140/90) -Controlled -Current treatment: Amlodipine 5 mg daily (Started Apr  2022) HCTZ 12.5 mg daily (Started Nov 2022) -Medications previously tried: Metoprolol (bradycardia)   -Denies hypotensive/hypertensive symptoms -Continue current medications  Hyperlipidemia: (LDL goal < 100) -Controlled -Current treatment: Rosuvastatin 40 mg daily  -Medications previously tried: Rosuvastatin -Continue current medications   Asthma (Goal: control symptoms and prevent exacerbations) -Controlled -Current treatment  Ventolin HFA 1-2 puffs every 6 hours as needed: Appropriate, Effective, Safe, Accessible Breztri 1 puff twice daily: Appropriate, Effective, Safe, Query accessible -Current allergy treatment  Fexofenadine 180 mg nightly: Appropriate, Effective, Safe, Accessible -Medications previously tried: NA  -Currently has 3 inhalers supply.  In the past four weeks, has the patient had:  Daytime asthma symptoms more than twice weekly?   No Any instance of night waking due to asthma?   No Utilized albuterol reliever for symptoms more than twice weekly? No - apx. 1 month  Any activity limitation due to asthma?    No -Increased shortness of breath with exertion (grocery store),  -Pulmonary function testing: FEV1/FVC 81% (2017) -Exacerbations requiring treatment in last 6 months: None -Patient reports consistent use of maintenance inhaler -Continue current medications  Anxiety (Goal: Minimize anxiety symtoms) -Controlled -managed by Dr. Nicolasa Ducking  -Current treatment: Buspirone 15 mg twice daily: Appropriate, Effective, Safe, Accessible Citalopram 40 mg daily: Appropriate, Effective, Query Safe Lorazepam 0.5 mg three times daily as  needed: Appropriate, Effective, Safe, Accessible -Medications previously tried/failed: Cymbalta, Effexor -Continue current medications   Insomnia (Goal: Improve sleep quality) -Uncontrolled -Managed by Dr. Nicolasa Ducking, needs to reschedule follow-up.  -Current treatment  Olly's Sleep 3 gummies nightly (4.5 mg melatonin)  -Medications previously  tried: Melatonin, Trazodone  -Difficulty falling asleep. In bed by 9:30 PM, won't fall asleep until 2-3 am. Wakes up 3 times nightly to use restroom. Normally wakes up around 11 am.   -Counseled on proper sleep hygiene.  -Did not tolerate CPAP, hurt nose.  -Recommended to continue current medication  Osteoporosis (Goal Prevent bone fractures) -Not ideally controlled -History of fracture of sacrum/coccyx -Patient is a candidate for pharmacologic treatment due to history of fracture.  -Current treatment  Calcium 600 mg daily: Appropriate, Effective, Safe, Accessible Vitamin D 1000 units daily: Appropriate, Query effective  -Medications previously tried: NA  -Continue current medications   Patient Goals/Self-Care Activities Patient will:  - check blood pressure 2-3 times weekly, document, and provide at future appointments  Follow Up Plan: Telephone follow up appointment with care management team member scheduled for:  10/24/2022 at 3:00 PM    Patient agreed to services and verbal consent obtained.   Patient verbalizes understanding of instructions and care plan provided today and agrees to view in [place]. Active MyChart status and patient understanding of how to access instructions and care plan via MyChart confirmed with patient.     Junius Argyle, PharmD, Para [place], CPP  Clinical Pharmacist Practitioner  [facility] [phone]

## 2022-07-05 NOTE — Progress Notes (Unsigned)
Argentina Ponder [place],acting as a scribe for Wilhemena Durie, MD.,have documented all relevant documentation on the behalf of Wilhemena Durie, MD,as directed by  Wilhemena Durie, MD while in the presence of Wilhemena Durie, MD.     Established patient visit   [name]   DOB: 1946-04-05   75 y.o. Female  MRN: [id number] Visit Date: 07/06/2022  Today's healthcare provider: Wilhemena Durie, MD   No chief complaint on file.  Subjective    HPI  Patient ise a 76 year old female who presents for follow up of Parkinsonian features.  She was last seen in March.  No changes in treatment made at that time.  Patient reports she is doing better.  Tremors are still present but better.   She is also here for follow up of depression.  At her March visit she was doing well and depression was in partial remission.  Patient feels she is doing well with this.  Overall she is feeling well.  She is finishing some physical therapy and getting ready to go back to the gym with her private trainer. she has had no recent falls. Medications: Outpatient Medications Prior to Visit  Medication Sig   acetaminophen (TYLENOL) 500 MG tablet Take 2 tablets (1,000 mg total) by mouth every 6 (six) hours.   albuterol (VENTOLIN HFA) 108 (90 Base) MCG/ACT inhaler Inhale 1-2 puffs into the lungs every 6 (six) hours as needed for wheezing or shortness of breath.   amLODipine (NORVASC) 5 MG tablet Take 1 tablet (5 mg total) by mouth daily.   Budeson-Glycopyrrol-Formoterol (BREZTRI AEROSPHERE) 160-9-4.8 MCG/ACT AERO Inhale 2 puffs into the lungs in the morning and at bedtime. Through [employer] Patient Assistance   busPIRone (BUSPAR) 15 MG tablet Take 15 mg by mouth 2 (two) times daily.   Calcium Carb-Cholecalciferol (CALCIUM 600+D3) 600-800 MG-UNIT TABS Take 1 tablet by mouth daily.   cholecalciferol (VITAMIN D) 25 MCG tablet Take 1 tablet (1,000 Units total) by mouth daily.   citalopram (CELEXA) 40  MG tablet Take 40 mg by mouth daily.   donepezil (ARICEPT) 5 MG tablet Take 5 mg by mouth at bedtime. (Patient not taking: Reported on 07/06/2022)   fexofenadine (ALLEGRA) 180 MG tablet Take 180 mg by mouth at bedtime.   LORazepam (ATIVAN) 0.5 MG tablet TAKE 1 TABLET BY MOUTH 3 TIMES DAILY AS NEEDED FOR ANXIETY   Melatonin 5 MG CHEW Chew 10 mg by mouth at bedtime.   Menthol, Topical Analgesic, (BIOFREEZE EX) Apply 1 application. topically daily as needed (pain).   primidone (MYSOLINE) 50 MG tablet Take 50 mg by mouth at bedtime.   rosuvastatin (CRESTOR) 40 MG tablet Take 1 tablet (40 mg total) by mouth at bedtime.   vitamin B-12 (CYANOCOBALAMIN) 1000 MCG tablet Take 1,000 mcg by mouth daily.   [DISCONTINUED] acetaminophen (TYLENOL) 500 MG tablet Take 500 mg by mouth every 6 (six) hours as needed for moderate pain. (Patient not taking: Reported on 07/01/2022)   [DISCONTINUED] docusate sodium (COLACE) 100 MG capsule Take 1 capsule (100 mg total) by mouth 2 (two) times daily. (Patient not taking: Reported on 07/01/2022)   [DISCONTINUED] oxyCODONE (ROXICODONE) 5 MG immediate release tablet Take 1 tablet (5 mg total) by mouth every 8 (eight) hours as needed. (Patient not taking: Reported on 03/28/2022)   [DISCONTINUED] traZODone (DESYREL) 100 MG tablet Take 300 mg by mouth at bedtime. (Patient not taking: Reported on 07/01/2022)   No facility-administered medications prior to visit.  Review of Systems  Respiratory:  Negative for shortness of breath.   Cardiovascular:  Negative for chest pain, palpitations and leg swelling.    Last thyroid functions Lab Results  Component Value Date   TSH 1.620 04/14/2021       Objective    BP 125/86 (BP Location: Left Arm, Patient Position: Sitting, Cuff Size: Normal)   Pulse 88   Temp 98.1 F (36.7 C) (Oral)   Wt 170 lb (77.1 kg)   SpO2 97%   BMI 26.63 kg/m  BP Readings from Last 3 Encounters:  07/06/22 125/86  07/01/22 100/60  03/28/22 120/80    Wt Readings from Last 3 Encounters:  07/06/22 170 lb (77.1 kg)  07/01/22 171 lb 8 oz (77.8 kg)  03/28/22 168 lb (76.2 kg)      Physical Exam Vitals reviewed.  Constitutional:      General: She is not in acute distress.    Appearance: She is well-developed.  HENT:     Head: Normocephalic and atraumatic.     Right Ear: Hearing normal.     Left Ear: Hearing normal.     Nose: Nose normal.  Eyes:     General: Lids are normal. No scleral icterus.       Right eye: No discharge.        Left eye: No discharge.     Conjunctiva/sclera: Conjunctivae normal.  Cardiovascular:     Rate and Rhythm: Normal rate and regular rhythm.     Heart sounds: Normal heart sounds.  Pulmonary:     Effort: Pulmonary effort is normal. No respiratory distress.  Abdominal:     Palpations: Abdomen is soft.  Skin:    General: Skin is warm and dry.     Findings: No lesion or rash.  Neurological:     General: No focal deficit present.     Mental Status: She is alert and oriented to person, place, and time.  Psychiatric:        Mood and Affect: Mood normal.        Speech: Speech normal.        Behavior: Behavior normal.        Thought Content: Thought content normal.        Judgment: Judgment normal.       No results found for any visits on 07/06/22.  Assessment & Plan     1. Primary hypertension Consider cutting amlodipine 5 to 2.5 mg if she develops any more hypotensive episodes.  2. Allergic rhinitis due to pollen, unspecified seasonality   3. Gastro-esophageal reflux disease without esophagitis   4. Pure hypercholesterolemia   5. Anxiety, generalized   6. Depression, major, recurrent, in partial remission ([place]) Followed by psychiatry  7. Mild cognitive impairment with memory loss Clinically stable presently  8. Parkinsonian features Followed by neurology   No follow-ups on file.      I, Wilhemena Durie, MD, have reviewed all documentation for this visit. The  documentation on 07/06/22 for the exam, diagnosis, procedures, and orders are all accurate and complete.    Desmund Elman Cranford Mon, MD  [facility] [phone] (phone) [phone] (fax)  [place]

## 2022-07-06 ENCOUNTER — Ambulatory Visit (INDEPENDENT_AMBULATORY_CARE_PROVIDER_SITE_OTHER): Payer: Medicare Other | Admitting: Family Medicine

## 2022-07-06 VITALS — BP 125/86 | HR 88 | Temp 98.1°F | Wt 170.0 lb

## 2022-07-06 DIAGNOSIS — I2584 Coronary atherosclerosis due to calcified coronary lesion: Secondary | ICD-10-CM

## 2022-07-06 DIAGNOSIS — J301 Allergic rhinitis due to pollen: Secondary | ICD-10-CM | POA: Diagnosis not present

## 2022-07-06 DIAGNOSIS — I10 Essential (primary) hypertension: Secondary | ICD-10-CM | POA: Diagnosis not present

## 2022-07-06 DIAGNOSIS — I2510 Atherosclerotic heart disease of native coronary artery without angina pectoris: Secondary | ICD-10-CM | POA: Diagnosis not present

## 2022-07-06 DIAGNOSIS — G3184 Mild cognitive impairment, so stated: Secondary | ICD-10-CM | POA: Diagnosis not present

## 2022-07-06 DIAGNOSIS — F411 Generalized anxiety disorder: Secondary | ICD-10-CM | POA: Diagnosis not present

## 2022-07-06 DIAGNOSIS — F3341 Major depressive disorder, recurrent, in partial remission: Secondary | ICD-10-CM

## 2022-07-06 DIAGNOSIS — R259 Unspecified abnormal involuntary movements: Secondary | ICD-10-CM

## 2022-07-06 DIAGNOSIS — K219 Gastro-esophageal reflux disease without esophagitis: Secondary | ICD-10-CM | POA: Diagnosis not present

## 2022-07-06 DIAGNOSIS — E7800 Pure hypercholesterolemia, unspecified: Secondary | ICD-10-CM

## 2022-07-08 ENCOUNTER — Telehealth: Payer: Medicare Other

## 2022-07-08 ENCOUNTER — Telehealth: Payer: Self-pay

## 2022-07-08 DIAGNOSIS — F5105 Insomnia due to other mental disorder: Secondary | ICD-10-CM | POA: Diagnosis not present

## 2022-07-08 DIAGNOSIS — F331 Major depressive disorder, recurrent, moderate: Secondary | ICD-10-CM | POA: Diagnosis not present

## 2022-07-08 DIAGNOSIS — F411 Generalized anxiety disorder: Secondary | ICD-10-CM | POA: Diagnosis not present

## 2022-07-08 NOTE — Telephone Encounter (Signed)
  Care Management   Outreach Note  07/08/2022 Name: Nancy Blackburn MRN: [id number] DOB: September 06, 1946  An unsuccessful telephone outreach was attempted today. The patient was referred to the case management team for assistance with care management and care coordination.   Follow Up Plan:  A HIPAA compliant voice message was left today requesting a return call.  [place] [facility] [phone]

## 2022-07-11 DIAGNOSIS — E785 Hyperlipidemia, unspecified: Secondary | ICD-10-CM

## 2022-07-11 DIAGNOSIS — J4520 Mild intermittent asthma, uncomplicated: Secondary | ICD-10-CM | POA: Diagnosis not present

## 2022-07-12 ENCOUNTER — Telehealth: Payer: Self-pay

## 2022-07-12 NOTE — Progress Notes (Signed)
Chronic Care Management Pharmacy Assistant   Name: Nancy Blackburn  MRN: [id number] DOB: [birth year]  Reason for Encounter: Hypertension Disease State Call.   Recent office visits:  07/06/2022 Dr. Rosanna Randy MD (PCP) Consider cutting amlodipine 5 to 2.5 mg if she develops any more hypotensive episodes  Recent consult visits:  07/01/2022 Dr. Garen Lah MD (Cardiology) Per note Patient not taking Hydrochlorothiazide,  Follow up in 6 months  Hospital visits:  None in previous 6 months  Medications: Outpatient Encounter Medications as of 07/12/2022  Medication Sig   acetaminophen (TYLENOL) 500 MG tablet Take 2 tablets (1,000 mg total) by mouth every 6 (six) hours.   albuterol (VENTOLIN HFA) 108 (90 Base) MCG/ACT inhaler Inhale 1-2 puffs into the lungs every 6 (six) hours as needed for wheezing or shortness of breath.   amLODipine (NORVASC) 5 MG tablet Take 1 tablet (5 mg total) by mouth daily.   Budeson-Glycopyrrol-Formoterol (BREZTRI AEROSPHERE) 160-9-4.8 MCG/ACT AERO Inhale 2 puffs into the lungs in the morning and at bedtime. Through [employer] Patient Assistance   busPIRone (BUSPAR) 15 MG tablet Take 15 mg by mouth 2 (two) times daily.   Calcium Carb-Cholecalciferol (CALCIUM 600+D3) 600-800 MG-UNIT TABS Take 1 tablet by mouth daily.   cholecalciferol (VITAMIN D) 25 MCG tablet Take 1 tablet (1,000 Units total) by mouth daily.   citalopram (CELEXA) 40 MG tablet Take 40 mg by mouth daily.   donepezil (ARICEPT) 5 MG tablet Take 5 mg by mouth at bedtime. (Patient not taking: Reported on 07/06/2022)   fexofenadine (ALLEGRA) 180 MG tablet Take 180 mg by mouth at bedtime.   LORazepam (ATIVAN) 0.5 MG tablet TAKE 1 TABLET BY MOUTH 3 TIMES DAILY AS NEEDED FOR ANXIETY   Melatonin 5 MG CHEW Chew 10 mg by mouth at bedtime.   Menthol, Topical Analgesic, (BIOFREEZE EX) Apply 1 application. topically daily as needed (pain).   primidone (MYSOLINE) 50 MG tablet Take 50 mg by mouth at bedtime.    rosuvastatin (CRESTOR) 40 MG tablet Take 1 tablet (40 mg total) by mouth at bedtime.   vitamin B-12 (CYANOCOBALAMIN) 1000 MCG tablet Take 1,000 mcg by mouth daily.   No facility-administered encounter medications on file as of 07/12/2022.   Care Gaps: Shingrix Vaccine COVID-19 Vaccine Influenza Vaccine    Star Rating Drugs: Rosuvastatin 20 mg last filled 06/29/2022 90 day supply at [facility].   Medication Fill Gaps: None ID   Reviewed chart prior to disease state call. Spoke with patient regarding BP  Recent Office Vitals: BP Readings from Last 3 Encounters:  07/06/22 125/86  07/01/22 100/60  03/28/22 120/80   Pulse Readings from Last 3 Encounters:  07/06/22 88  07/01/22 65  03/03/22 77    Wt Readings from Last 3 Encounters:  07/06/22 170 lb (77.1 kg)  07/01/22 171 lb 8 oz (77.8 kg)  03/28/22 168 lb (76.2 kg)     Kidney Function Lab Results  Component Value Date/Time   CREATININE 0.89 03/03/2022 05:24 AM   CREATININE 0.97 02/23/2022 09:40 AM   CREATININE 1.02 (H) 11/23/2017 02:35 PM   CREATININE 1.28 10/28/2014 05:55 PM   CREATININE 0.80 10/06/2013 05:18 AM   GFRNONAA >60 03/03/2022 05:24 AM   GFRNONAA 55 (L) 11/23/2017 02:35 PM   GFRAA 72 08/27/2020 03:02 PM   GFRAA 64 11/23/2017 02:35 PM       Latest Ref Rng & Units 03/03/2022    5:24 AM 02/23/2022    9:40 AM 01/14/2022   11:16 AM  BMP  Glucose 70 - 99 mg/dL 139  97  110   BUN 8 - 23 mg/dL '15  17  17   '$ Creatinine 0.44 - 1.00 mg/dL 0.89  0.97  1.12   BUN/Creat Ratio 12 - 28   15   Sodium 135 - 145 mmol/L 138  140  140   Potassium 3.5 - 5.1 mmol/L 3.9  3.3  4.0   Chloride 98 - 111 mmol/L 101  104  101   CO2 22 - 32 mmol/L '30  31  28   '$ Calcium 8.9 - 10.3 mg/dL 8.9  8.9  9.2     Current antihypertensive regimen:  Amlodipine 5 mg daily   How often are you checking your Blood Pressure? 1-2x per week  Current home BP readings:  Patient states since she had a low blood pressure reading at her  Cardiology appointment she was inform to start check her blood pressure at home to make sure her blood pressure does not drop again. Patient reports her blood pressure has been okay since then, and she is keeping a extra eye on it.Patient reports her blood pressure has been "okay", but does not have any readings.  What recent interventions/DTPs have been made by any provider to improve Blood Pressure control since last CPP Visit:  07/01/2022 Dr. Garen Lah MD (Cardiology) Per note Patient not taking Hydrochlorothiazide  Any recent hospitalizations or ED visits since last visit with CPP? No  What diet changes have been made to improve Blood Pressure Control?  None ID What exercise is being done to improve your Blood Pressure Control?  Patient reports she is going to restart going to the gym twice a week hopefully by the end of this month.  Adherence Review: Is the patient currently on ACE/ARB medication? No Does the patient have >5 day gap between last estimated fill dates? No  Anderson Malta Clinical Production designer, theatre/television/film [phone]

## 2022-07-19 ENCOUNTER — Ambulatory Visit: Payer: Self-pay

## 2022-07-19 DIAGNOSIS — I10 Essential (primary) hypertension: Secondary | ICD-10-CM

## 2022-07-19 NOTE — Chronic Care Management (AMB) (Signed)
   07/19/2022  Nancy Blackburn [birth year] [id number]  Documentation encounter created to complete case transition. The care management team will follow for care coordination as needed.  [place] [facility] [phone]

## 2022-07-22 DIAGNOSIS — E538 Deficiency of other specified B group vitamins: Secondary | ICD-10-CM | POA: Diagnosis not present

## 2022-07-22 DIAGNOSIS — G4700 Insomnia, unspecified: Secondary | ICD-10-CM | POA: Diagnosis not present

## 2022-07-22 DIAGNOSIS — R259 Unspecified abnormal involuntary movements: Secondary | ICD-10-CM | POA: Diagnosis not present

## 2022-07-22 DIAGNOSIS — G250 Essential tremor: Secondary | ICD-10-CM | POA: Diagnosis not present

## 2022-07-22 DIAGNOSIS — G4733 Obstructive sleep apnea (adult) (pediatric): Secondary | ICD-10-CM | POA: Diagnosis not present

## 2022-08-05 ENCOUNTER — Other Ambulatory Visit: Payer: Self-pay | Admitting: Family Medicine

## 2022-08-05 NOTE — Telephone Encounter (Signed)
Requested medications are due for refill today.  unsure  Requested medications are on the active medications list.  yes  Last refill. 12/28/2021 unknown quantity  Future visit scheduled.   yes  Notes to clinic.  Unclear if medication was prescribed.    Requested Prescriptions  Pending Prescriptions Disp Refills   amLODipine (NORVASC) 5 MG tablet [Pharmacy Med Name: AMLODIPINE BESYLATE 5 MG Tablet] 90 tablet     Sig: TAKE 1 TABLET EVERY DAY     Cardiovascular: Calcium Channel Blockers 2 Passed - 08/05/2022  4:36 PM      Passed - Last BP in normal range    BP Readings from Last 1 Encounters:  07/06/22 125/86         Passed - Last Heart Rate in normal range    Pulse Readings from Last 1 Encounters:  07/06/22 88         Passed - Valid encounter within last 6 months    Recent Outpatient Visits           1 month ago Primary hypertension   [facility] Jerrol Banana., MD   5 months ago Cyst of left ovary   [facility] Jerrol Banana., MD   8 months ago Sepsis, due to unspecified organism, unspecified whether acute organ dysfunction present [facility])   [facility] Jerrol Banana., MD   1 year ago Chest pain, unspecified type   [facility] Jerrol Banana., MD   1 year ago Mild intermittent asthma with acute exacerbation   [facility] Jerrol Banana., MD       Future Appointments             In 2 months Jerrol Banana., MD [facility] [place]

## 2022-08-12 ENCOUNTER — Telehealth: Payer: Self-pay

## 2022-08-12 NOTE — Progress Notes (Signed)
Chronic Care Management Pharmacy Assistant   Name: Nancy Blackburn  MRN: [id number] DOB: 01-25-1946  Reason for Encounter:Hypertension Disease State Call.   Recent office visits:  No recent office visit  Recent consult visits:  07/22/2022 Bethena Roys PA (Neurology) Stop Trazodone , Return in about 1 year   Hospital visits:  None in previous 6 months  Medications: Outpatient Encounter Medications as of 08/12/2022  Medication Sig   acetaminophen (TYLENOL) 500 MG tablet Take 2 tablets (1,000 mg total) by mouth every 6 (six) hours.   albuterol (VENTOLIN HFA) 108 (90 Base) MCG/ACT inhaler Inhale 1-2 puffs into the lungs every 6 (six) hours as needed for wheezing or shortness of breath.   amLODipine (NORVASC) 5 MG tablet TAKE 1 TABLET EVERY DAY   Budeson-Glycopyrrol-Formoterol (BREZTRI AEROSPHERE) 160-9-4.8 MCG/ACT AERO Inhale 2 puffs into the lungs in the morning and at bedtime. Through [employer] Patient Assistance   busPIRone (BUSPAR) 15 MG tablet Take 15 mg by mouth 2 (two) times daily.   Calcium Carb-Cholecalciferol (CALCIUM 600+D3) 600-800 MG-UNIT TABS Take 1 tablet by mouth daily.   cholecalciferol (VITAMIN D) 25 MCG tablet Take 1 tablet (1,000 Units total) by mouth daily.   citalopram (CELEXA) 40 MG tablet Take 40 mg by mouth daily.   donepezil (ARICEPT) 5 MG tablet Take 5 mg by mouth at bedtime. (Patient not taking: Reported on 07/06/2022)   fexofenadine (ALLEGRA) 180 MG tablet Take 180 mg by mouth at bedtime.   LORazepam (ATIVAN) 0.5 MG tablet TAKE 1 TABLET BY MOUTH 3 TIMES DAILY AS NEEDED FOR ANXIETY   Melatonin 5 MG CHEW Chew 10 mg by mouth at bedtime.   Menthol, Topical Analgesic, (BIOFREEZE EX) Apply 1 application. topically daily as needed (pain).   primidone (MYSOLINE) 50 MG tablet Take 50 mg by mouth at bedtime.   rosuvastatin (CRESTOR) 40 MG tablet Take 1 tablet (40 mg total) by mouth at bedtime.   vitamin B-12 (CYANOCOBALAMIN) 1000 MCG tablet Take 1,000 mcg by mouth  daily.   No facility-administered encounter medications on file as of 08/12/2022.    Care Gaps: Shingrix Vaccine COVID-19 Vaccine Influenza Vaccine    Star Rating Drugs: Rosuvastatin 20 mg last filled 06/29/2022 90 day supply at [facility].   Medication Fill Gaps: None ID   Reviewed chart prior to disease state call. Spoke with patient regarding BP  Recent Office Vitals: BP Readings from Last 3 Encounters:  07/06/22 125/86  07/01/22 100/60  03/28/22 120/80   Pulse Readings from Last 3 Encounters:  07/06/22 88  07/01/22 65  03/03/22 77    Wt Readings from Last 3 Encounters:  07/06/22 170 lb (77.1 kg)  07/01/22 171 lb 8 oz (77.8 kg)  03/28/22 168 lb (76.2 kg)     Kidney Function Lab Results  Component Value Date/Time   CREATININE 0.89 03/03/2022 05:24 AM   CREATININE 0.97 02/23/2022 09:40 AM   CREATININE 1.02 (H) 11/23/2017 02:35 PM   CREATININE 1.28 10/28/2014 05:55 PM   CREATININE 0.80 10/06/2013 05:18 AM   GFRNONAA >60 03/03/2022 05:24 AM   GFRNONAA 55 (L) 11/23/2017 02:35 PM   GFRAA 72 08/27/2020 03:02 PM   GFRAA 64 11/23/2017 02:35 PM       Latest Ref Rng & Units 03/03/2022    5:24 AM 02/23/2022    9:40 AM 01/14/2022   11:16 AM  BMP  Glucose 70 - 99 mg/dL 139  97  110   BUN 8 - 23 mg/dL 15  17  17   Creatinine 0.44 - 1.00 mg/dL 0.89  0.97  1.12   BUN/Creat Ratio 12 - 28   15   Sodium 135 - 145 mmol/L 138  140  140   Potassium 3.5 - 5.1 mmol/L 3.9  3.3  4.0   Chloride 98 - 111 mmol/L 101  104  101   CO2 22 - 32 mmol/L '30  31  28   '$ Calcium 8.9 - 10.3 mg/dL 8.9  8.9  9.2     Current antihypertensive regimen:  Amlodipine 5 mg daily  Patient denies headaches,lightheadedness or dizziness.   How often are you checking your Blood Pressure? 1-2x per week  Current home BP readings:  Patient reports she is unsure what her readings are, but reports it has been "normal". Patient states she is going to try to remember to keep a log.  What recent  interventions/DTPs have been made by any provider to improve Blood Pressure control since last CPP Visit: None ID  Any recent hospitalizations or ED visits since last visit with CPP? No  What diet changes have been made to improve Blood Pressure Control?  None ID  What exercise is being done to improve your Blood Pressure Control?  Patient reports she has been going to the gym twice a week.  Adherence Review: Is the patient currently on ACE/ARB medication? No Does the patient have >5 day gap between last estimated fill dates? No   Anderson Malta Clinical Production designer, theatre/television/film [phone]

## 2022-09-28 ENCOUNTER — Ambulatory Visit: Payer: Medicare Other

## 2022-09-28 ENCOUNTER — Other Ambulatory Visit: Payer: Self-pay

## 2022-09-28 DIAGNOSIS — E785 Hyperlipidemia, unspecified: Secondary | ICD-10-CM

## 2022-09-28 DIAGNOSIS — F5105 Insomnia due to other mental disorder: Secondary | ICD-10-CM | POA: Diagnosis not present

## 2022-09-28 DIAGNOSIS — F331 Major depressive disorder, recurrent, moderate: Secondary | ICD-10-CM | POA: Diagnosis not present

## 2022-09-28 DIAGNOSIS — F411 Generalized anxiety disorder: Secondary | ICD-10-CM | POA: Diagnosis not present

## 2022-09-28 MED ORDER — ROSUVASTATIN CALCIUM 40 MG PO TABS: 40.0000 mg | ORAL_TABLET | Freq: Every day | ORAL | 3 refills | Status: DC

## 2022-10-04 DIAGNOSIS — M7542 Impingement syndrome of left shoulder: Secondary | ICD-10-CM | POA: Diagnosis not present

## 2022-10-05 ENCOUNTER — Ambulatory Visit (INDEPENDENT_AMBULATORY_CARE_PROVIDER_SITE_OTHER): Payer: Medicare Other

## 2022-10-05 DIAGNOSIS — Z23 Encounter for immunization: Secondary | ICD-10-CM | POA: Diagnosis not present

## 2022-10-05 DIAGNOSIS — F5105 Insomnia due to other mental disorder: Secondary | ICD-10-CM | POA: Diagnosis not present

## 2022-10-05 DIAGNOSIS — F331 Major depressive disorder, recurrent, moderate: Secondary | ICD-10-CM | POA: Diagnosis not present

## 2022-10-08 IMAGING — DX DG KNEE 1-2V PORT*R*
2 series · 2 of 2 positions shown · non-contrast
Comparison: None.

CLINICAL DATA: Patient status post right knee replacement today.

EXAM:
PORTABLE RIGHT KNEE - 1-2 VIEW

[knee ap]
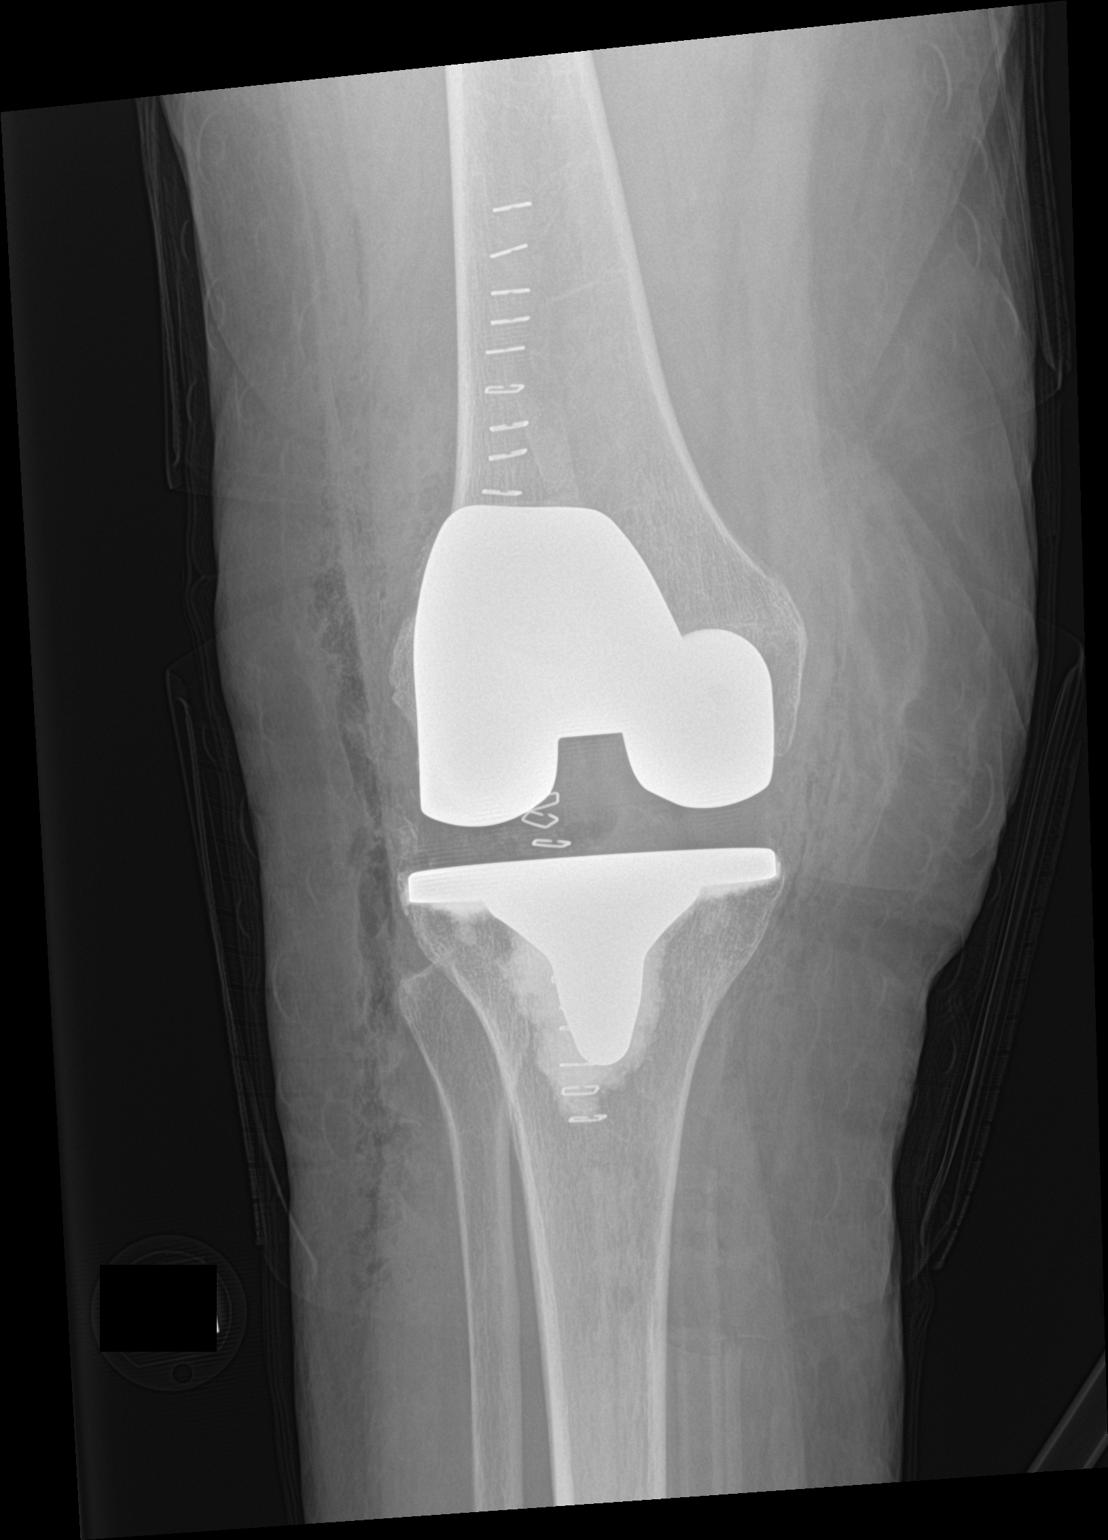

[knee lat]
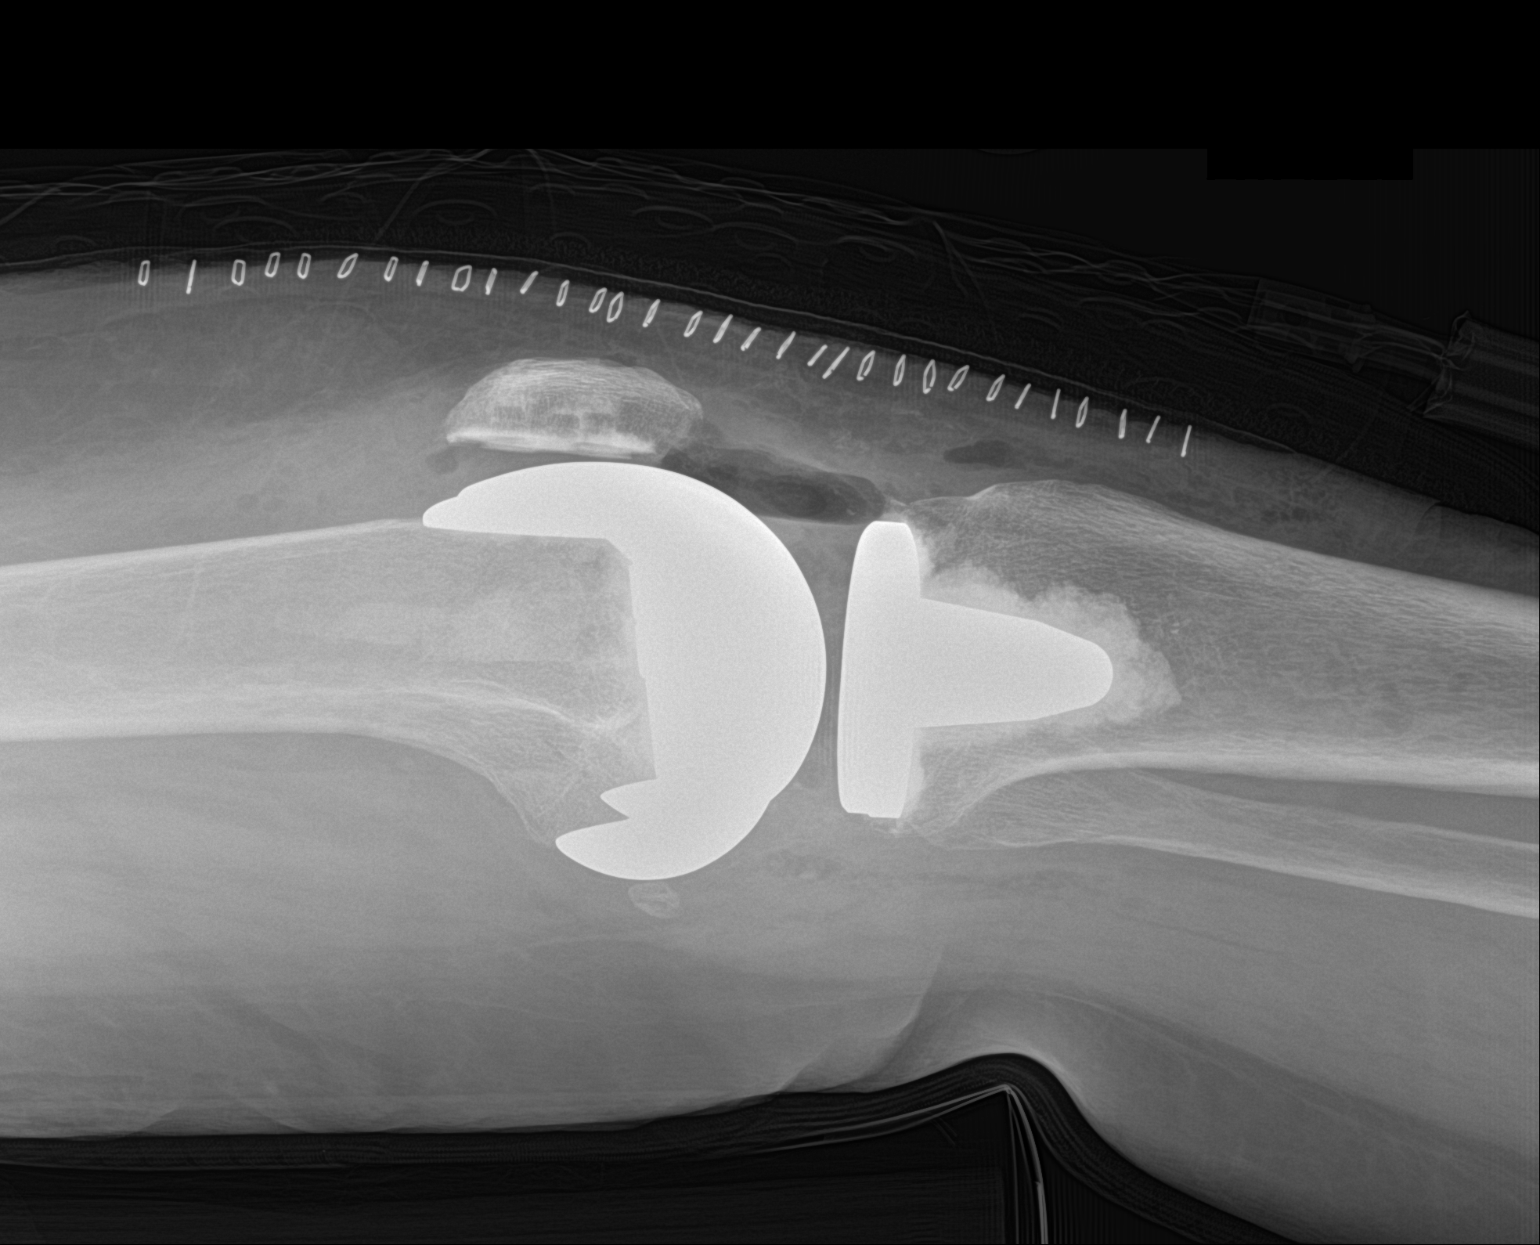

[2 of 2 positions shown; findings below may reference images not displayed]

FINDINGS: Total arthroplasty is in place. No fracture is identified. Hardware
appears normal. Gas in the soft tissues and surgical staples noted.
IMPRESSION: Status post right knee replacement.  No acute abnormality.

## 2022-10-10 ENCOUNTER — Ambulatory Visit: Payer: Medicare Other | Admitting: Family Medicine

## 2022-10-13 DIAGNOSIS — F411 Generalized anxiety disorder: Secondary | ICD-10-CM | POA: Diagnosis not present

## 2022-10-13 DIAGNOSIS — F5105 Insomnia due to other mental disorder: Secondary | ICD-10-CM | POA: Diagnosis not present

## 2022-10-13 DIAGNOSIS — F331 Major depressive disorder, recurrent, moderate: Secondary | ICD-10-CM | POA: Diagnosis not present

## 2022-10-24 ENCOUNTER — Ambulatory Visit (INDEPENDENT_AMBULATORY_CARE_PROVIDER_SITE_OTHER): Payer: Medicare Other

## 2022-10-24 DIAGNOSIS — E7800 Pure hypercholesterolemia, unspecified: Secondary | ICD-10-CM

## 2022-10-24 DIAGNOSIS — J4520 Mild intermittent asthma, uncomplicated: Secondary | ICD-10-CM

## 2022-10-24 NOTE — Progress Notes (Signed)
Chronic Care Management Pharmacy Note  11/02/2022 Name:  Nancy Blackburn MRN:  [id number] DOB:  1946/08/29  Summary: Patient presents for CCM follow-up.   Recommendations/Changes made from today's visit: Continue current medications  Plan: CCM enrollment status changed to "previously enrolled" as per patient request on 10/24/2022 to discontinue enrollment. Case closed to case management services in primary care home.   Recommended Problem List Changes:  Add: Essential Hypertension Osteoporosis  CKD Stage 3a  Insomnia  Subjective: Nancy Blackburn is an 76 y.o. year old female who is a primary patient of Jerrol Banana., MD.  The CCM team was consulted for assistance with disease management and care coordination needs.    Engaged with patient by telephone for follow up visit in response to provider referral for pharmacy case management and/or care coordination services.   Consent to Services:  The patient was given information about Chronic Care Management services, agreed to services, and gave verbal consent prior to initiation of services.  Please see initial visit note for detailed documentation.   Patient Care Team: Jerrol Banana., MD as PCP - General (Unknown Physician Specialty) Kate Sable, MD as PCP - Cardiology (Cardiology) Marlyn Corporal Clearnce Sorrel, PA-C as Physician Assistant (Family Medicine) Birder Robson, MD as Referring Physician (Ophthalmology) Vladimir Crofts, MD as Consulting Physician (Neurology) Bjorn Loser, MD as Consulting Physician (Urology) Newman Pies, MD as Consulting Physician (Neurosurgery) Chauncey Mann, MD as Referring Physician (Psychiatry) Richmond Campbell, MD as Consulting Physician (Gastroenterology) Clent Jacks, RN as Oncology Nurse Navigator Germaine Pomfret, [facility] (Pharmacist)  Recent office visits: 11/10/2021 Dr. Rosanna Randy MD (PCP) No Medication Changes noted, AMB Referral to [facility], Return in about 3 months   Recent consult visits:  03/28/22: Patient presented to Dr. Gilman Schmidt (OBGYN).   02/28/22: Dr. Garen Lah increased rosuvastatin to 40 mg daily   12/21/2021 Dr. Patsey Berthold MD (Pulmonology) No Medication changes noted, Ambulatory referral to cardiology, follow-up in 3 months   Hospital visits: Admitted to the hospital on 10/18/2021 due to sepsis.   Objective:  Lab Results  Component Value Date   CREATININE 0.89 03/03/2022   BUN 15 03/03/2022   GFRNONAA >60 03/03/2022   GFRAA 72 08/27/2020   NA 138 03/03/2022   K 3.9 03/03/2022   CALCIUM 8.9 03/03/2022   CO2 30 03/03/2022   GLUCOSE 139 (H) 03/03/2022    Lab Results  Component Value Date/Time   HGBA1C 5.4 02/26/2021 09:07 AM   HGBA1C 5.6 05/27/2016 10:15 AM    Last diabetic Eye exam: No results found for: "HMDIABEYEEXA"  Last diabetic Foot exam: No results found for: "HMDIABFOOTEX"   Lab Results  Component Value Date   CHOL 172 02/23/2022   HDL 50 02/23/2022   LDLCALC 83 02/23/2022   LDLDIRECT 186 (H) 01/18/2022   TRIG 197 (H) 02/23/2022   CHOLHDL 3.4 02/23/2022       Latest Ref Rng & Units 03/03/2022    5:24 AM 02/23/2022    9:40 AM 01/14/2022   11:16 AM  Hepatic Function  Total Protein 6.5 - 8.1 g/dL 6.0  6.5  6.4   Albumin 3.5 - 5.0 g/dL 2.9  3.5  3.9   AST 15 - 41 U/L _0 ALT 0 - 44 U/L _1 Alk Phosphatase 38 - 126 U/L 68  82  119   Total Bilirubin 0.3 - 1.2 mg/dL 0.5  0.6  0.3  Lab Results  Component Value Date/Time   TSH 1.620 04/14/2021 09:37 AM   TSH 1.330 08/27/2020 03:02 PM       Latest Ref Rng & Units 03/03/2022    5:24 AM 02/23/2022    9:40 AM 11/10/2021    3:08 PM  CBC  WBC 4.0 - 10.5 K/uL 12.2  8.8  7.5   Hemoglobin 12.0 - 15.0 g/dL 12.0  13.9  12.4   Hematocrit 36.0 - 46.0 % 37.4  44.6  37.7   Platelets 150 - 400 K/uL 245  287  430     Lab Results  Component Value Date/Time   VD25OH 18.50 (L) 10/18/2021 12:56 PM     Clinical ASCVD: No  The 10-year ASCVD risk score (Arnett DK, et al., 2019) is: 24%   Values used to calculate the score:     Age: 76 years     Sex: Female     Is Non-Hispanic African American: No     Diabetic: No     Tobacco smoker: No     Systolic Blood Pressure: 408 mmHg     Is BP treated: Yes     HDL Cholesterol: 50 mg/dL     Total Cholesterol: 172 mg/dL       01/13/2022    2:24 PM 11/22/2021    2:44 PM 02/16/2021   11:28 AM  Depression screen PHQ 2/9  Decreased Interest 0 0 0  Down, Depressed, Hopeless 0 1 0  PHQ - 2 Score 0 1 0  Altered sleeping  0 0  Tired, decreased energy  1 0  Change in appetite  0 0  Feeling bad or failure about yourself   0 0  Trouble concentrating  0 0  Moving slowly or fidgety/restless  0 0  Suicidal thoughts  0 0  PHQ-9 Score  2 0  Difficult doing work/chores  Not difficult at all Not difficult at all    Social History   Tobacco Use  Smoking Status Never  Smokeless Tobacco Never   BP Readings from Last 3 Encounters:  07/06/22 125/86  07/01/22 100/60  03/28/22 120/80   Pulse Readings from Last 3 Encounters:  07/06/22 88  07/01/22 65  03/03/22 77   Wt Readings from Last 3 Encounters:  07/06/22 170 lb (77.1 kg)  07/01/22 171 lb 8 oz (77.8 kg)  03/28/22 168 lb (76.2 kg)   BMI Readings from Last 3 Encounters:  07/06/22 26.63 kg/m  07/01/22 26.86 kg/m  03/28/22 26.31 kg/m    Assessment/Interventions: Review of patient past medical history, allergies, medications, health status, including review of consultants reports, laboratory and other test data, was performed as part of comprehensive evaluation and provision of chronic care management services.   SDOH:  (Social Determinants of Health) assessments and interventions performed: Yes SDOH Interventions    Flowsheet Row Clinical Support from 01/13/2022 in [place] Management from 12/28/2021 in [place] Management from  11/25/2021 in [facility] Patient Outreach Telephone from 11/09/2021 in [place] Visit from 12/17/2019 in [place] from 01/18/2018 in [place] Interventions Intervention Not Indicated -- Intervention Not Indicated Intervention Not Indicated -- --  Housing Interventions Intervention Not Indicated -- -- -- -- --  Transportation Interventions Intervention Not Indicated -- Intervention Not Indicated Intervention Not Indicated -- --  Depression Interventions/Treatment  -- -- -- -- Currently  on Treatment Currently on Treatment  Financial Strain Interventions Other (Comment)  [her son helps her financially] Other (Comment)  [PAP] -- -- -- --  Physical Activity Interventions Intervention Not Indicated -- -- -- -- --  Stress Interventions Patient Refused -- -- -- -- --  Social Connections Interventions Intervention Not Indicated -- -- -- -- --      -Last DEXA Scan: 02/07/18   T-Score femoral neck: NA  T-Score total hip: -2.4  T-Score lumbar spine: NA  T-Score forearm radius: -1.4  SDOH Screenings   Food Insecurity: No Food Insecurity (01/13/2022)  Housing: Low Risk  (01/13/2022)  Transportation Needs: No Transportation Needs (01/13/2022)  Alcohol Screen: Low Risk  (01/13/2022)  Depression (PHQ2-9): Low Risk  (01/13/2022)  Financial Resource Strain: Low Risk  (01/13/2022)  Recent Concern: Financial Resource Strain - High Risk (12/28/2021)  Physical Activity: Insufficiently Active (01/13/2022)  Social Connections: Moderately Isolated (01/13/2022)  Stress: Stress Concern Present (01/13/2022)  Tobacco Use: Low Risk  (07/01/2022)    CCM Care Plan  Allergies  Allergen Reactions   Anti-Inflammatory Enzyme [Nutritional Supplements] Other (See Comments)    REACTION: Due to bleeding ulcer   Cymbalta [Duloxetine Hcl] Other (See Comments)    REACTION: Urinary retention   Clarithromycin  Other (See Comments)    Upsets stomach   Clarithromycin     GI upset   Donepezil Hcl Other (See Comments)    Significant bradycardia causing syncope   Nsaids     GI BLEED   Ranitidine Diarrhea    severe diarrhea   Venlafaxine     severe depression    Medications Reviewed Today     Reviewed by Kate Sable, MD (Physician) on 07/01/22 at 1125  Med List Status: <None>   Medication Order Taking? Sig Documenting Provider Last Dose Status Informant  acetaminophen (TYLENOL) 500 MG tablet [id number] No Take 500 mg by mouth every 6 (six) hours as needed for moderate pain.  Patient not taking: Reported on 07/01/2022   [provider] Not Taking Active Self  acetaminophen (TYLENOL) 500 MG tablet [id number] Yes Take 2 tablets (1,000 mg total) by mouth every 6 (six) hours. Homero Fellers, MD Taking Active   albuterol (VENTOLIN HFA) 108 (90 Base) MCG/ACT inhaler [id number] Yes Inhale 1-2 puffs into the lungs every 6 (six) hours as needed for wheezing or shortness of breath. Parrett, Fonnie Mu, NP Taking Active Self  amLODipine (NORVASC) 5 MG tablet [id number] Yes Take 1 tablet (5 mg total) by mouth daily. Jerrol Banana., MD Taking Active Self  Budeson-Glycopyrrol-Formoterol [facility] AEROSPHERE) 160-9-4.8 MCG/ACT Hollie Salk [id number] Yes Inhale 2 puffs into the lungs in the morning and at bedtime. Through [place] Patient Assistance Jerrol Banana., MD Taking Active Self  busPIRone (BUSPAR) 15 MG tablet [id number] Yes Take 15 mg by mouth 2 (two) times daily. [provider] Taking Active   Calcium Carb-Cholecalciferol (CALCIUM 600+D3) 600-800 MG-UNIT TABS [id number] Yes Take 1 tablet by mouth daily. [provider] Taking Active Self  cholecalciferol (VITAMIN D) 25 MCG tablet [id number] Yes Take 1 tablet (1,000 Units total) by mouth daily. Loletha Grayer, MD Taking Active Self  citalopram (CELEXA) 40 MG tablet [id number] Yes Take 40 mg by mouth daily. [provider] Taking Active Self           Med Note (NEWCOMER Maximiano Coss Aug 31, 2020  4:02 PM)    docusate sodium (COLACE) 100 MG capsule [id number] No Take 1 capsule (100  mg total) by mouth 2 (two) times daily.  Patient not taking: Reported on 07/01/2022   Homero Fellers, MD Not Taking Active   donepezil (ARICEPT) 5 MG tablet [id number] Yes Take 5 mg by mouth at bedtime. [provider] Taking Active   fexofenadine (ALLEGRA) 180 MG tablet [id number] Yes Take 180 mg by mouth at bedtime. [provider] Taking Active Self  LORazepam (ATIVAN) 0.5 MG tablet [id number] Yes TAKE 1 TABLET BY MOUTH 3 TIMES DAILY AS NEEDED FOR ANXIETY Jerrol Banana., MD Taking Active   Melatonin 5 MG CHEW [id number] Yes Chew 10 mg by mouth at bedtime. [provider] Taking Active   Menthol, Topical Analgesic, (BIOFREEZE EX) [id number] Yes Apply 1 application. topically daily as needed (pain). [provider] Taking Active Self  oxyCODONE (ROXICODONE) 5 MG immediate release tablet [id number] No Take 1 tablet (5 mg total) by mouth every 8 (eight) hours as needed.  Patient not taking: Reported on 03/28/2022   Homero Fellers, MD Not Taking Active   primidone (MYSOLINE) 50 MG tablet [id number] Yes Take 50 mg by mouth at bedtime. [provider] Taking Active Self  rosuvastatin (CRESTOR) 40 MG tablet [id number] Yes Take 1 tablet (40 mg total) by mouth at bedtime. Kate Sable, MD Taking Active   traZODone (DESYREL) 100 MG tablet [id number] No Take 300 mg by mouth at bedtime.  Patient not taking: Reported on 07/01/2022   [provider] Not Taking Active Self           Med Note Michaelle Birks, Cathe Mons A   Tue Dec 28, 2021 10:01 AM)    vitamin B-12 (CYANOCOBALAMIN) 1000 MCG tablet [id number] Yes Take 1,000 mcg by mouth daily. [provider] Taking Active Self            Patient Active Problem List   Diagnosis Date Noted   Right  ovarian cyst 03/02/2022   Cyst of right ovary    Cyst of left ovary    Acute pain of right knee    Elevated LFTs    Acute metabolic encephalopathy    Sepsis due to Proteus species ([place]) 10/18/2021   Rhabdomyolysis 10/18/2021   Acute kidney injury ([place]) 10/18/2021   Leukocytosis 10/18/2021   Asthma 07/02/2021   S/P TKR (total knee replacement) using cement, right 03/09/2021   Bradycardia 07/03/2020   Postural dizziness with presyncope 07/01/2020   Family history of colon cancer in mother 02/22/2018   Cervical spondylosis 05/04/2017   Cervical radiculopathy 05/04/2017   Family history of Cushing disease 01/12/2017   B12 deficiency 12/18/2016   Mild cognitive impairment with memory loss 12/18/2016   Parkinsonian features 12/18/2016   Vitamin D deficiency 12/18/2016   Chalazion, bilateral 06/08/2016   Absolute anemia 07/30/2015   Edema extremities 07/30/2015   Benign essential tremor 07/30/2015   Anxiety, generalized 07/30/2015   Gastro-esophageal reflux disease without esophagitis 07/30/2015   HLD (hyperlipidemia) 07/30/2015   Cannot sleep 07/30/2015   Lumbar radiculopathy 07/30/2015   Depression, major, recurrent, in partial remission ([place]) 07/30/2015   Arthritis, degenerative 07/30/2015   Allergic rhinitis 07/30/2015   Gastroduodenal ulcer 07/30/2015   Calcium blood increased 07/30/2015   Pre-syncope 05/17/2015   Dyspnea 03/31/2015   Bleeding stomach ulcer 10/14/2013   Anemia, iron deficiency 10/14/2013   Weakness 10/14/2013   Ejection fraction    Pericardial effusion    Palpitations    Low back pain    Normal nuclear stress test     Immunization History  Administered Date(s) Administered   Fluad Quad(high Dose 65+) 10/21/2019, 10/14/2020, 08/25/2021, 10/05/2022   Influenza Inj Mdck Quad Pf 10/27/2016   Influenza Split 10/05/2011, 10/10/2012   Influenza, High Dose Seasonal PF 09/04/2014, 09/01/2015, 10/05/2017, 10/10/2018   Influenza-Unspecified 10/12/2013,  08/12/2014, 09/04/2014   PFIZER(Purple Top)SARS-COV-2 Vaccination 02/06/2020, 03/03/2020, 11/11/2020   Pneumococcal Conjugate-13 03/26/2015   Pneumococcal Polysaccharide-23 10/11/1999, 04/10/2012   Td 08/23/1999   Tdap 05/20/2019   Tetanus 05/28/2019   Zoster, Live 09/26/2008    Conditions to be addressed/monitored:  Hypertension, Hyperlipidemia, GERD, Asthma, Chronic Kidney Disease, Anxiety, and Osteoporosis  Care Plan : General Pharmacy (Adult)  Updates made by Germaine Pomfret, RPH since 11/02/2022 12:00 AM     Problem: Hypertension, Hyperlipidemia, GERD, Asthma, Chronic Kidney Disease, Anxiety, and Osteoporosis   Priority: High     Long-Range Goal: Patient-Specific Goal   Start Date: 12/28/2021  Expected End Date: 11/02/2022  This Visit's Progress: On track  Recent Progress: On track  Priority: High  Note:   Current Barriers:  Unable to independently afford treatment regimen Unable to achieve control of cholesterol  Suboptimal therapeutic regimen for HTN, Osteoporosis, depression  Pharmacist Clinical Goal(s):  Patient will verbalize ability to afford treatment regimen achieve control of hypertension as evidenced by BP less than 140/90 adhere to plan to optimize therapeutic regimen for HLD, osteoporosis as evidenced by report of adherence to recommended medication management changes through collaboration with PharmD and provider.   Interventions: 1:1 collaboration with Jerrol Banana., MD regarding development and update of comprehensive plan of care as evidenced by provider attestation and co-signature Inter-disciplinary care team collaboration (see longitudinal plan of care) Comprehensive medication review performed; medication list updated in electronic medical record  Hypertension (BP goal <140/90) -Controlled -Current treatment: Amlodipine 5 mg daily (Started Apr 2022) HCTZ 12.5 mg daily (Started Nov 2022) -Medications previously tried: Metoprolol  (bradycardia)   -Denies hypotensive/hypertensive symptoms -Current blood pressures: 127/83 (Dr. Nicolasa Ducking)  -Continue current medications  Hyperlipidemia: (LDL goal < 100) -Controlled -Current treatment: Rosuvastatin 40 mg daily  -Medications previously tried: Rosuvastatin -Continue current medications   Asthma (Goal: control symptoms and prevent exacerbations) -Controlled -Current treatment  Ventolin HFA 1-2 puffs every 6 hours as needed: Appropriate, Effective, Safe, Accessible Breztri 1 puff twice daily: Appropriate, Effective, Safe, Query accessible -Current allergy treatment  Fexofenadine 180 mg nightly: Appropriate, Effective, Safe, Accessible -Medications previously tried: NA  -Currently has 3 inhalers supply.  In the past four weeks, has the patient had:  Daytime asthma symptoms more than twice weekly?   No Any instance of night waking due to asthma?   No Utilized albuterol reliever for symptoms more than twice weekly? No - apx. 1 month  Any activity limitation due to asthma?    No -Increased shortness of breath with exertion (grocery store),  -Pulmonary function testing: FEV1/FVC 81% (2017) -Exacerbations requiring treatment in last 6 months: None -Patient reports consistent use of maintenance inhaler -Continue current medications  Anxiety (Goal: Minimize anxiety symtoms) -Controlled -managed by Dr. Nicolasa Ducking  -Current treatment: Buspirone 15 mg twice daily: Appropriate, Effective, Safe, Accessible Citalopram 40 mg daily: Appropriate, Effective, Query Safe Lorazepam 0.5 mg three times daily as needed: Appropriate, Effective, Safe, Accessible -Medications previously tried/failed: Cymbalta, Effexor -Continue current medications   Insomnia (Goal: Improve sleep quality) -Uncontrolled -Managed by Dr. Nicolasa Ducking, needs to reschedule follow-up.  -Current treatment  Olly's Sleep 3 gummies nightly (4.5 mg melatonin)  -Medications previously tried: Melatonin, Trazodone  -Difficulty  falling asleep. In bed by 9:30  PM, won't fall asleep until 2-3 am. Wakes up 3 times nightly to use restroom. Normally wakes up around 11 am.   -Counseled on proper sleep hygiene.  -Did not tolerate CPAP, hurt nose.  -Recommended to continue current medication  Osteoporosis (Goal Prevent bone fractures) -Not ideally controlled -History of fracture of sacrum/coccyx -Patient is a candidate for pharmacologic treatment due to history of fracture.  -Current treatment  Calcium 600 mg daily: Appropriate, Effective, Safe, Accessible Vitamin D 1000 units daily: Appropriate, Query effective  -Medications previously tried: NA  -Continue current medications   Patient Goals/Self-Care Activities Patient will:  - check blood pressure 2-3 times weekly, document, and provide at future appointments  Follow Up Plan: CCM enrollment status changed to "previously enrolled" as per patient request on 10/24/2022 to discontinue enrollment. Case closed to case management services in primary care home.       Medication Assistance: Application for Breztri  medication assistance program. in process.  Anticipated assistance start date TBD.  See plan of care for additional detail.  Compliance/Adherence/Medication fill history: Care Gaps: Shingrix  Star-Rating Drugs: None ID  Patient's preferred pharmacy is:  [facility], [place] [address] Phone: [phone] Fax: [phone] - [place], [address] [place] [address] Phone: [phone] Fax: [phone]  Uses pill box? Yes Pt endorses 100% compliance  We discussed: Current pharmacy is preferred with insurance plan and patient is satisfied with pharmacy services Patient decided to: Continue current medication management strategy  Care Plan and Follow Up Patient Decision:  Patient agrees to Care Plan and  Follow-up.  Plan: CCM enrollment status changed to "previously enrolled" as per patient request on 10/24/2022 to discontinue enrollment. Case closed to case management services in primary care home.   Junius Argyle, PharmD, Para [place], CPP  Clinical Pharmacist Practitioner  [facility] [phone]

## 2022-10-26 DIAGNOSIS — M7542 Impingement syndrome of left shoulder: Secondary | ICD-10-CM | POA: Diagnosis not present

## 2022-11-08 DIAGNOSIS — F331 Major depressive disorder, recurrent, moderate: Secondary | ICD-10-CM | POA: Diagnosis not present

## 2022-11-08 DIAGNOSIS — F5105 Insomnia due to other mental disorder: Secondary | ICD-10-CM | POA: Diagnosis not present

## 2022-11-10 DIAGNOSIS — E7800 Pure hypercholesterolemia, unspecified: Secondary | ICD-10-CM

## 2022-11-10 DIAGNOSIS — J4520 Mild intermittent asthma, uncomplicated: Secondary | ICD-10-CM

## 2022-11-16 DIAGNOSIS — M7542 Impingement syndrome of left shoulder: Secondary | ICD-10-CM | POA: Diagnosis not present

## 2022-11-28 DIAGNOSIS — F5105 Insomnia due to other mental disorder: Secondary | ICD-10-CM | POA: Diagnosis not present

## 2022-11-28 DIAGNOSIS — F331 Major depressive disorder, recurrent, moderate: Secondary | ICD-10-CM | POA: Diagnosis not present

## 2022-12-15 DIAGNOSIS — F5105 Insomnia due to other mental disorder: Secondary | ICD-10-CM | POA: Diagnosis not present

## 2022-12-15 DIAGNOSIS — F331 Major depressive disorder, recurrent, moderate: Secondary | ICD-10-CM | POA: Diagnosis not present

## 2022-12-15 DIAGNOSIS — F411 Generalized anxiety disorder: Secondary | ICD-10-CM | POA: Diagnosis not present

## 2023-01-09 DIAGNOSIS — F331 Major depressive disorder, recurrent, moderate: Secondary | ICD-10-CM | POA: Diagnosis not present

## 2023-01-09 DIAGNOSIS — F5105 Insomnia due to other mental disorder: Secondary | ICD-10-CM | POA: Diagnosis not present

## 2023-01-17 ENCOUNTER — Ambulatory Visit (INDEPENDENT_AMBULATORY_CARE_PROVIDER_SITE_OTHER): Payer: Medicare Other

## 2023-01-17 NOTE — Progress Notes (Signed)
Virtual Visit via Telephone Note  I connected with  Nancy Blackburn on 01/17/23 at  2:00 PM EST by telephone and verified that I am speaking with the correct person using two identifiers.  Location: Patient: home Provider: office Persons participating in the virtual visit: patient/Nurse Health Advisor   I discussed the limitations, risks, security and privacy concerns of performing an evaluation and management service by telephone and the availability of in person appointments. The patient expressed understanding and agreed to proceed.  Interactive audio and video telecommunications were attempted between this nurse and patient, however failed, due to patient having technical difficulties OR patient did not have access to video capability.  We continued and completed visit with audio only.  Some vital signs may be absent or patient reported.   Esau Fridman Moody Bruins, LPN  Subjective:   Nancy Blackburn is a 77 y.o. female who presents for Medicare Annual (Subsequent) preventive examination.  Review of Systems           Objective:    There were no vitals filed for this visit. There is no height or weight on file to calculate BMI.     03/02/2022    6:22 AM 02/22/2022    8:42 AM 01/13/2022    2:27 PM 12/01/2021    1:03 PM 10/19/2021    3:10 PM 04/04/2021    2:40 PM 03/09/2021    6:23 AM  Advanced Directives  Does Patient Have a Medical Advance Directive? Yes Yes No Yes Yes Yes Yes  Type of Advance Directive Living will;Healthcare Power of [place];Living will Living will [place];Living will  Does patient want to make changes to medical advance directive? No - Patient declined   Yes (ED - Information included in AVS) No - Patient declined  No - Patient declined  Copy of [place] in Chart? No - copy requested Yes - validated most recent copy  scanned in chart (See row information)   No - copy requested  No - copy requested  Would patient like information on creating a medical advance directive? No - Patient declined  No - Patient declined   No - Patient declined     Current Medications (verified) Outpatient Encounter Medications as of 01/17/2023  Medication Sig   acetaminophen (TYLENOL) 500 MG tablet Take 2 tablets (1,000 mg total) by mouth every 6 (six) hours.   albuterol (VENTOLIN HFA) 108 (90 Base) MCG/ACT inhaler Inhale 1-2 puffs into the lungs every 6 (six) hours as needed for wheezing or shortness of breath.   amLODipine (NORVASC) 5 MG tablet TAKE 1 TABLET EVERY DAY   Budeson-Glycopyrrol-Formoterol (BREZTRI AEROSPHERE) 160-9-4.8 MCG/ACT AERO Inhale 2 puffs into the lungs in the morning and at bedtime. Through [place] Patient Assistance   busPIRone (BUSPAR) 15 MG tablet Take 15 mg by mouth 2 (two) times daily.   Calcium Carb-Cholecalciferol (CALCIUM 600+D3) 600-800 MG-UNIT TABS Take 1 tablet by mouth daily.   cholecalciferol (VITAMIN D) 25 MCG tablet Take 1 tablet (1,000 Units total) by mouth daily.   citalopram (CELEXA) 40 MG tablet Take 40 mg by mouth daily.   donepezil (ARICEPT) 5 MG tablet Take 5 mg by mouth at bedtime. (Patient not taking: Reported on 07/06/2022)   fexofenadine (ALLEGRA) 180 MG tablet Take 180 mg by mouth at bedtime.   LORazepam (ATIVAN) 0.5 MG tablet TAKE 1 TABLET BY MOUTH 3 TIMES DAILY AS NEEDED  FOR ANXIETY   Melatonin 5 MG CHEW Chew 10 mg by mouth at bedtime.   Menthol, Topical Analgesic, (BIOFREEZE EX) Apply 1 application. topically daily as needed (pain).   primidone (MYSOLINE) 50 MG tablet Take 50 mg by mouth at bedtime.   rosuvastatin (CRESTOR) 40 MG tablet Take 1 tablet (40 mg total) by mouth at bedtime.   vitamin B-12 (CYANOCOBALAMIN) 1000 MCG tablet Take 1,000 mcg by mouth daily.   No facility-administered encounter medications on file as of 01/17/2023.    Allergies (verified) Anti-inflammatory  enzyme [nutritional supplements], Cymbalta [duloxetine hcl], Clarithromycin, Clarithromycin, Donepezil hcl, Nsaids, Ranitidine, and Venlafaxine   History: Past Medical History:  Diagnosis Date   Adnexal cyst 10/18/2021   a.) CT abdomen: large cystic mass in the LEFT hemipelvis measuring 8.5 cm. b.) Pelvis US 10/22/2021: adnexal cyst measuring 8.0 x 8.2 x 7.9 (volume 270 cm). c.) Pelvis US 01/17/2022: cystic mass measured 8.2 x 8.0 x 8.3 (volume 290 cm).   Anxiety    a.) on BZO (lorazepam) therapy PRN   Arthritis    Asthma    B12 deficiency    Benign essential tremor    Bleeding gastric ulcer    Bradycardia    Cervical cancer (HCC) 1978   Chronic sinus infection    Coronary artery disease    a.) CT chest 04/02/2021: coronary calcifications. b.) Lexi 01/21/2022: calcifications near [facility].   Depression    Diastolic dysfunction    a.) TTE 05/24/2012: EF 55-65%; triv MR, mild PR; G1DD. b.) TTE 05/24/2012: EF 55-65%; mild LVH; triv MR, mild TR/PR; G1DD. c.) TTE 04/15/2015: EF 60-65%; mild LVH; mild LA dilation; mild PR, triv TR; G2DD. d.) TTE 07/03/20 and 09/28/2021: EF 60-65%; mild LVH; G1DD.   Fracture, sacrum/coccyx ([place])    GERD (gastroesophageal reflux disease)    Hiatal hernia    History of kidney stones    Hyperlipidemia    Hypertension    Insomnia    a.) takes trazodone PRN   Low back pain    Mild dementia ([place])    Nasal polyps    Normal nuclear stress test    a. 07/2005 Nl nuc stress test.   Osteopenia    Osteoporosis    Palpitations    a. June 2013:  Event recorder normal sinus rhythm with rare PVCs - ? symptomatic PVC's.   Parkinsonian features    a.) resting tremors; LEFT hemibody Parkinsonism. b.) takes primidone   Pericardial effusion    a.) TTE 05/24/2012: small. b.) TTE 04/15/2015: trivial. c.) TTE 09/28/2021: small.   Pneumonia    PONV (postoperative nausea and vomiting)    Thyroid nodule    Vitamin D deficiency    Past Surgical History:  Procedure  Laterality Date   BACK SURGERY  08/2010   BLADDER SUSPENSION  2003   bone spur  2009   removed from right collar bone area and partial collar bone removed   CATARACT EXTRACTION W/ INTRAOCULAR LENS  IMPLANT, BILATERAL     EYE SURGERY     LUMBAR FUSION  2011, 2012   ROBOTIC ASSISTED SALPINGO OOPHERECTOMY Bilateral 03/02/2022   Procedure: XI ROBOTIC ASSISTED SALPINGO OOPHORECTOMY;  Surgeon: Homero Fellers, MD;  Location: [facility] ORS;  Service: Gynecology;  Laterality: Bilateral;   SHOULDER ARTHROSCOPY Right 2012   THYROIDECTOMY, PARTIAL  1976   TONSILLECTOMY     as child   TOTAL KNEE ARTHROPLASTY Right 03/09/2021   Procedure: Right TOTAL KNEE ARTHROPLASTY;  Surgeon: Thornton Park, MD;  Location:  [facility] ORS;  Service: Orthopedics;  Laterality: Right;   TUBAL LIGATION  1977   VAGINAL HYSTERECTOMY  1978   d/t cervical cancer   Family History  Problem Relation Age of Onset   Colon cancer Mother    Heart murmur Mother    Heart disease Mother    Angina Mother    Heart disease Father    COPD Father    Atrial fibrillation Brother    Cushing syndrome Daughter    Cancer Daughter        thyroid   Breast cancer Cousin    Hypotension Neg Hx    Malignant hyperthermia Neg Hx    Pseudochol deficiency Neg Hx    Social History   Socioeconomic History   Marital status: Divorced    Spouse name: Not on file   Number of children: 2   Years of education: Not on file   Highest education level: 12th grade  Occupational History   Occupation: retired  Tobacco Use   Smoking status: Never   Smokeless tobacco: Never  Vaping Use   Vaping Use: Never used  Substance and Sexual Activity   Alcohol use: Yes    Comment: 1 glass of wine or mixed drink every 2-3 months   Drug use: No   Sexual activity: Never  Other Topics Concern   Not on file  Social History Narrative   Grandson lives with her   Social Determinants of Health   Financial Resource Strain: Low Risk  (01/13/2022)   Overall  Financial Resource Strain (CARDIA)    Difficulty of Paying Living Expenses: Not very hard  Recent Concern: Financial Resource Strain - High Risk (12/28/2021)   Overall Financial Resource Strain (CARDIA)    Difficulty of Paying Living Expenses: Hard  Food Insecurity: No Food Insecurity (01/13/2022)   Hunger Vital Sign    Worried About Running Out of Food in the Last Year: Never true    [place] in the Last Year: Never true  Transportation Needs: No Transportation Needs (01/13/2022)   PRAPARE - Hydrologist (Medical): No    Lack of Transportation (Non-Medical): No  Physical Activity: Insufficiently Active (01/13/2022)   Exercise Vital Sign    Days of Exercise per Week: 2 days    Minutes of Exercise per Session: 60 min  Stress: Stress Concern Present (01/13/2022)   [place]    Feeling of Stress : Rather much  Social Connections: Moderately Isolated (01/13/2022)   Social Connection and Isolation Panel [NHANES]    Frequency of Communication with Friends and Family: More than three times a week    Frequency of Social Gatherings with Friends and Family: Once a week    Attends Religious Services: More than 4 times per year    Active Member of [employer] or Organizations: No    Attends Archivist Meetings: Never    Marital Status: Divorced    Tobacco Counseling Counseling given: Not Answered   Clinical Intake:                 Diabetic?         Activities of Daily Living    03/02/2022    3:00 PM 02/22/2022    8:42 AM  In your present state of health, do you have any difficulty performing the following activities:  Hearing? 0 0  Vision? 0 0  Difficulty concentrating or making decisions? 0 0  Walking  or climbing stairs? 0 0  Dressing or bathing? 0 0  Doing errands, shopping? 0 0    Patient Care Team: Eulas Post, MD as PCP - General (Unknown Physician  Specialty) Kate Sable, MD as PCP - Cardiology (Cardiology) Marlyn Corporal Clearnce Sorrel, PA-C as Physician Assistant (Family Medicine) Birder Robson, MD as Referring Physician (Ophthalmology) Vladimir Crofts, MD as Consulting Physician (Neurology) Bjorn Loser, MD as Consulting Physician (Urology) Newman Pies, MD as Consulting Physician (Neurosurgery) Chauncey Mann, MD as Referring Physician (Psychiatry) Richmond Campbell, MD as Consulting Physician (Gastroenterology) Clent Jacks, RN as Oncology Nurse Navigator Germaine Pomfret, [facility] (Pharmacist)  Indicate any recent Medical Services you may have received from other than [facility] providers in the past year (date may be approximate).     Assessment:   This is a routine wellness examination for [place].  Hearing/Vision screen No results found.  Dietary issues and exercise activities discussed:     Goals Addressed   None    Depression Screen    01/13/2022    2:24 PM 11/22/2021    2:44 PM 02/16/2021   11:28 AM 12/17/2019    2:26 PM 04/10/2019    2:01 PM 04/10/2019    2:00 PM 03/12/2019   11:21 AM  PHQ 2/9 Scores  PHQ - 2 Score 0 1 0 0 1 1 0  PHQ- 9 Score  2 0 '4 3 3     '$ Fall Risk    01/13/2022    2:28 PM 11/25/2021    1:15 PM 04/14/2020    1:27 PM 12/17/2019    2:28 PM 04/18/2019    1:13 PM  Fall Risk   Falls in the past year? 1 1 0 0 0  Number falls in past yr: 0 1 0 0   Injury with Fall? 1 1 0 0   Risk for fall due to : History of fall(s) History of fall(s);Impaired balance/gait     Follow up Falls prevention discussed Falls prevention discussed       Cognitive Function:    08/01/2016    2:09 PM  MMSE - Mini Mental State Exam  Orientation to time 5  Orientation to Place 5  Registration 3  Attention/ Calculation 5  Recall 2  Language- name 2 objects 2  Language- repeat 1  Language- follow 3 step command 3  Language- read & follow direction 1  Write a sentence 1  Copy design 1  Total score 29         02/28/2019   11:21 AM 01/04/2017    2:27 PM  6CIT Screen  What Year? 0 points 0 points  What month? 0 points 0 points  What time? 0 points 3 points  Count back from 20 0 points 0 points  Months in reverse 0 points 0 points  Repeat phrase 0 points 0 points  Total Score 0 points 3 points    Immunizations Immunization History  Administered Date(s) Administered   Fluad Quad(high Dose 65+) 10/21/2019, 10/14/2020, 08/25/2021, 10/05/2022   Influenza Inj Mdck Quad Pf 10/27/2016   Influenza Split 10/05/2011, 10/10/2012   Influenza, High Dose Seasonal PF 09/04/2014, 09/01/2015, 10/05/2017, 10/10/2018   Influenza-Unspecified 10/12/2013, 08/12/2014, 09/04/2014   PFIZER(Purple Top)SARS-COV-2 Vaccination 02/06/2020, 03/03/2020, 11/11/2020   Pneumococcal Conjugate-13 03/26/2015   Pneumococcal Polysaccharide-23 10/11/1999, 04/10/2012   Td 08/23/1999   Tdap 05/20/2019   Tetanus 05/28/2019   Zoster, Live 09/26/2008   Screening Tests Health Maintenance  Topic Date Due   Zoster Vaccines- Shingrix (  1 of 2) Never done   COVID-19 Vaccine (4 - 2023-24 season) 08/12/2022   Medicare Annual Wellness (AWV)  01/13/2023   DEXA SCAN  02/07/2023   COLONOSCOPY (Pts 45-44yr Insurance coverage will need to be confirmed)  02/23/2023   DTaP/Tdap/Td (4 - Td or Tdap) 05/27/2029   Pneumonia Vaccine 78 Years old  Completed   INFLUENZA VACCINE  Completed   Hepatitis C Screening  Completed   HPV VACCINES  Aged Out    Health Maintenance  Health Maintenance Due  Topic Date Due   Zoster Vaccines- Shingrix (1 of 2) Never done   COVID-19 Vaccine (4 - 2023-24 season) 08/12/2022   Medicare Annual Wellness (AWV)  01/13/2023       Plan:     Screenings to include cognitive, depression, and falls Referrals and appointments  In addition, I have reviewed and discussed with patient certain preventive protocols, quality metrics, and best practice recommendations. A written personalized care plan for  preventive services as well as general preventive health recommendations were provided to patient.     CLebron Conners LPN   22/0/2334  Nurse Notes: After starting visit [facility] out that [facility]

## 2023-01-26 DIAGNOSIS — F5105 Insomnia due to other mental disorder: Secondary | ICD-10-CM | POA: Diagnosis not present

## 2023-01-26 DIAGNOSIS — F331 Major depressive disorder, recurrent, moderate: Secondary | ICD-10-CM | POA: Diagnosis not present

## 2023-01-26 DIAGNOSIS — F411 Generalized anxiety disorder: Secondary | ICD-10-CM | POA: Diagnosis not present

## 2023-01-31 IMAGING — CR DG CHEST 2V
1 series · 2 of 2 positions shown · non-contrast
Comparison: CT scan of the chest 04/04/2021

CLINICAL DATA: Shortness of breath

EXAM:
CHEST - 2 VIEW

[Series 1: w chest pa · 0.14mm/px · 2 of 2 slices shown]
[im 1/2]
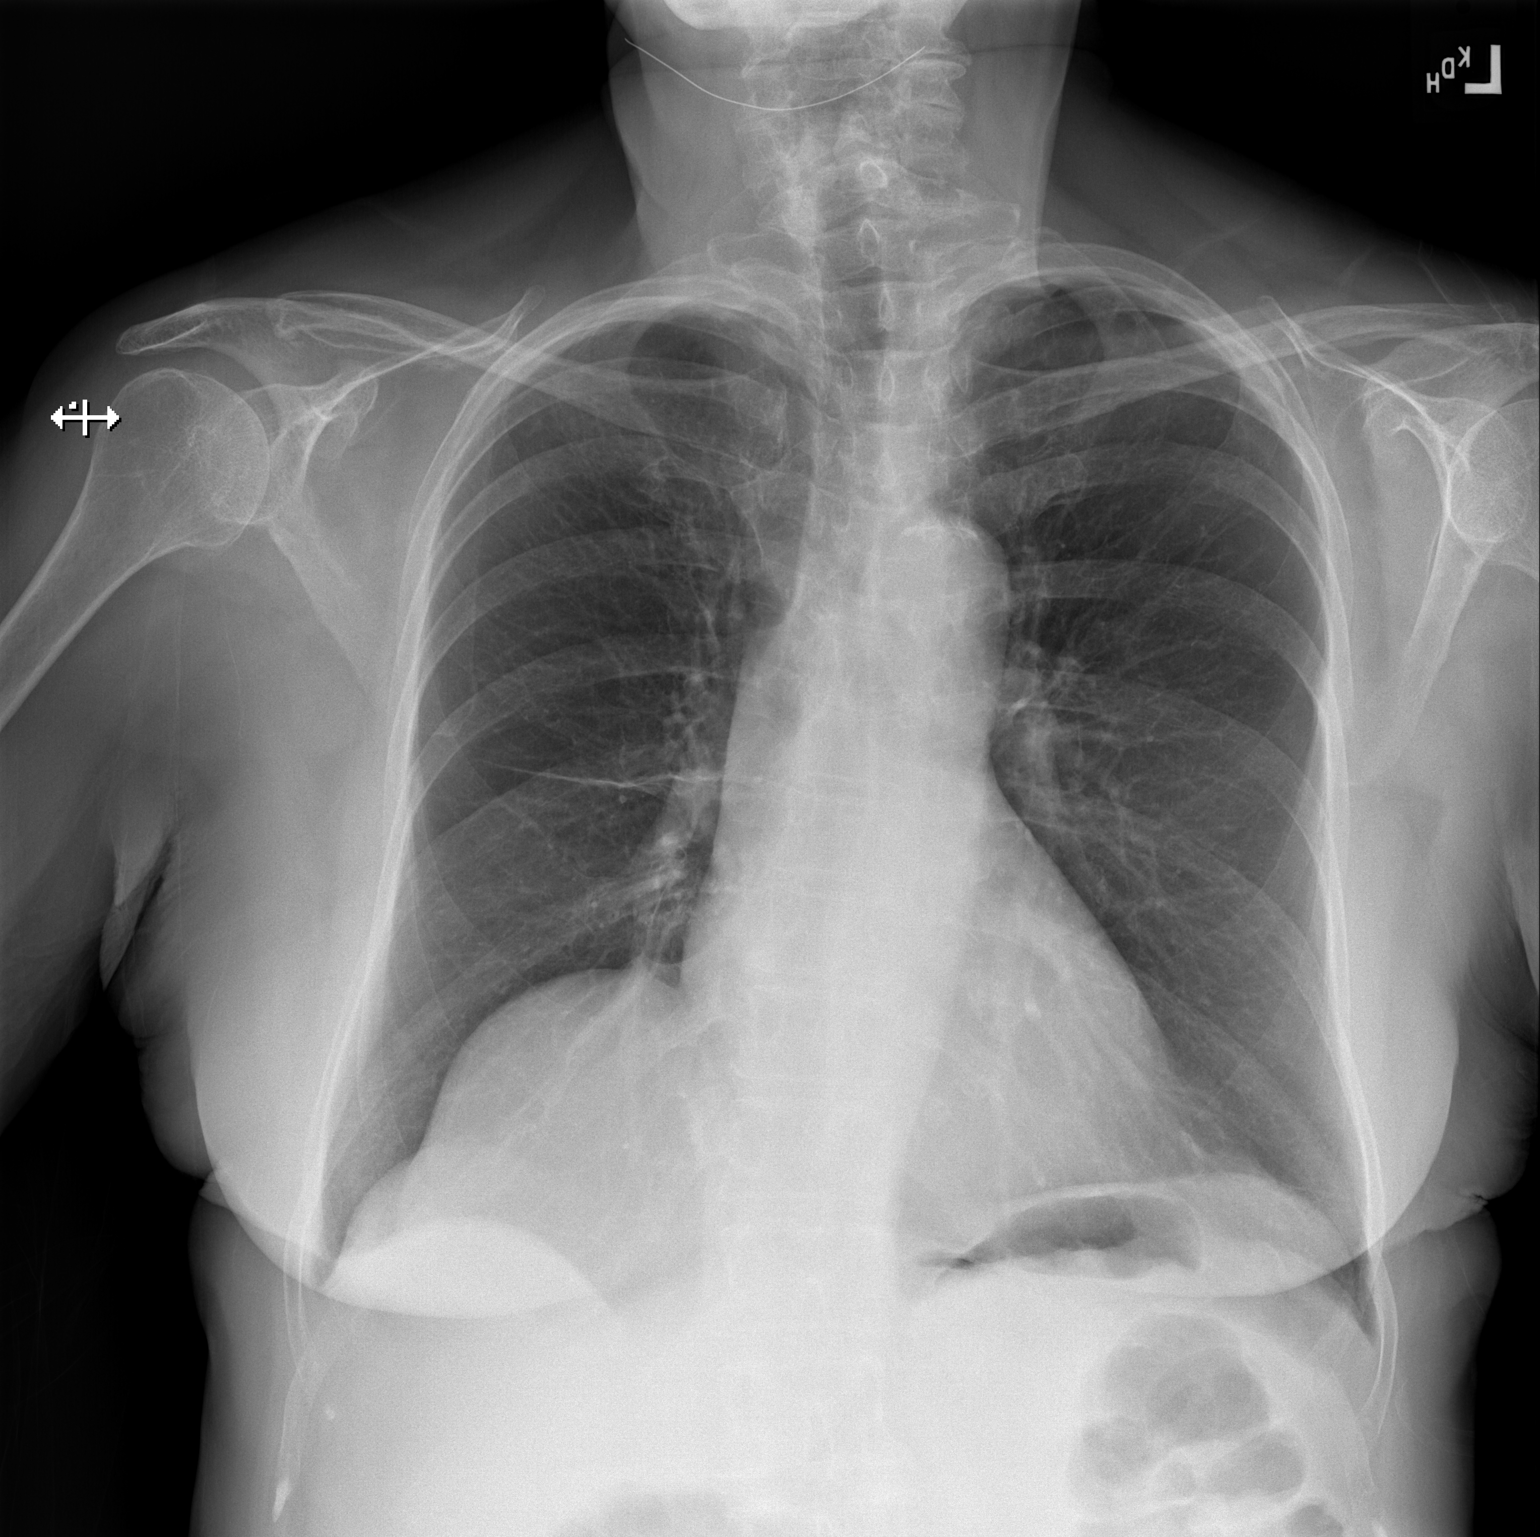
[im 2/2]
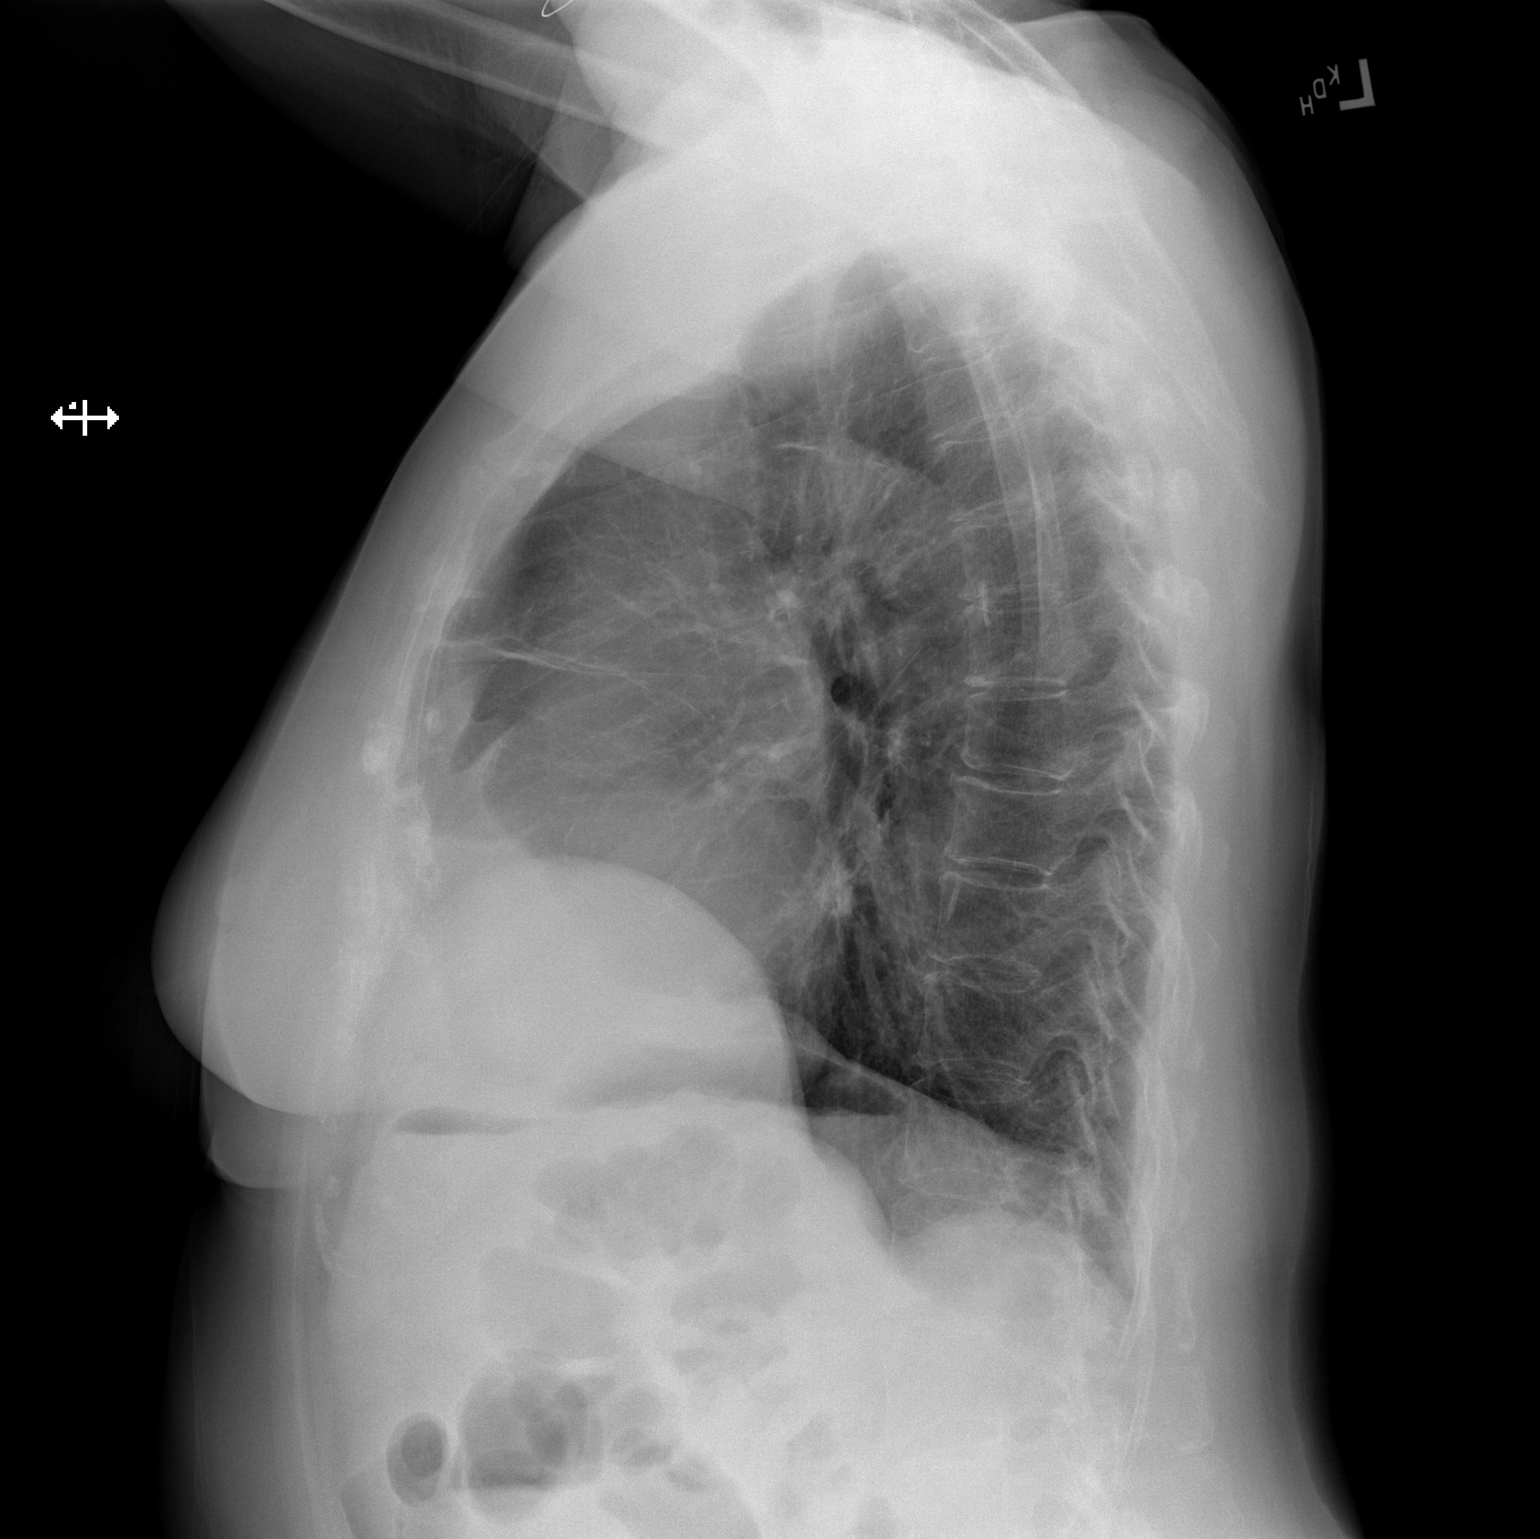

[2 of 2 positions shown; findings below may reference images not displayed]

FINDINGS: The heart size and mediastinal contours are within normal limits.
Linear scar versus in the lingula. Both lungs are clear. The
visualized skeletal structures are unremarkable. Atherosclerotic
calcification present in the transverse aorta.
IMPRESSION: No active cardiopulmonary disease.

## 2023-02-01 ENCOUNTER — Other Ambulatory Visit: Payer: Self-pay | Admitting: Physician Assistant

## 2023-02-01 ENCOUNTER — Ambulatory Visit (INDEPENDENT_AMBULATORY_CARE_PROVIDER_SITE_OTHER): Payer: Medicare Other | Admitting: Physician Assistant

## 2023-02-01 ENCOUNTER — Encounter: Payer: Self-pay | Admitting: Physician Assistant

## 2023-02-01 VITALS — BP 170/85 | HR 80 | Temp 98.4°F | Ht 67.0 in | Wt 183.0 lb

## 2023-02-01 DIAGNOSIS — N3001 Acute cystitis with hematuria: Secondary | ICD-10-CM

## 2023-02-01 DIAGNOSIS — R350 Frequency of micturition: Secondary | ICD-10-CM | POA: Diagnosis not present

## 2023-02-01 LAB — POCT URINALYSIS DIPSTICK
Bilirubin, UA: NEGATIVE
Glucose, UA: NEGATIVE
Nitrite, UA: NEGATIVE
Protein, UA: POSITIVE — AB
Spec Grav, UA: 1.015 (ref 1.010–1.025)
Urobilinogen, UA: 0.2 E.U./dL
pH, UA: 5 (ref 5.0–8.0)

## 2023-02-01 MED ORDER — SULFAMETHOXAZOLE-TRIMETHOPRIM 800-160 MG PO TABS: 1.0000 | ORAL_TABLET | Freq: Two times a day (BID) | ORAL | 0 refills | Status: DC

## 2023-02-01 NOTE — Progress Notes (Signed)
Established patient visit   [name]   DOB: [birth year]   77 y.o. Female  MRN: [id number] Visit Date: 02/01/2023  Today's healthcare provider: Mikey Kirschner, PA-C   Cc. Dysuria, frequency   Subjective    HPI  Pt reports dysuria, urgency ,frequency starting last week. Denies abdominal pain, nausea, vomiting, fever, back pain .    Medications: Outpatient Medications Prior to Visit  Medication Sig   acetaminophen (TYLENOL) 500 MG tablet Take 2 tablets (1,000 mg total) by mouth every 6 (six) hours.   albuterol (VENTOLIN HFA) 108 (90 Base) MCG/ACT inhaler Inhale 1-2 puffs into the lungs every 6 (six) hours as needed for wheezing or shortness of breath.   amLODipine (NORVASC) 5 MG tablet TAKE 1 TABLET EVERY DAY   Budeson-Glycopyrrol-Formoterol (BREZTRI AEROSPHERE) 160-9-4.8 MCG/ACT AERO Inhale 2 puffs into the lungs in the morning and at bedtime. Through [place] Patient Assistance   Calcium Carb-Cholecalciferol (CALCIUM 600+D3) 600-800 MG-UNIT TABS Take 1 tablet by mouth daily.   cholecalciferol (VITAMIN D) 25 MCG tablet Take 1 tablet (1,000 Units total) by mouth daily.   citalopram (CELEXA) 40 MG tablet Take 40 mg by mouth daily.   fexofenadine (ALLEGRA) 180 MG tablet Take 180 mg by mouth at bedtime.   LORazepam (ATIVAN) 0.5 MG tablet TAKE 1 TABLET BY MOUTH 3 TIMES DAILY AS NEEDED FOR ANXIETY   Melatonin 5 MG CHEW Chew 10 mg by mouth at bedtime.   Menthol, Topical Analgesic, (BIOFREEZE EX) Apply 1 application. topically daily as needed (pain).   mirtazapine (REMERON) 30 MG tablet Take 30 mg by mouth at bedtime.   primidone (MYSOLINE) 50 MG tablet Take 50 mg by mouth at bedtime.   rosuvastatin (CRESTOR) 40 MG tablet Take 1 tablet (40 mg total) by mouth at bedtime.   vitamin B-12 (CYANOCOBALAMIN) 1000 MCG tablet Take 1,000 mcg by mouth daily.   [DISCONTINUED] busPIRone (BUSPAR) 15 MG tablet Take 15 mg by mouth 2 (two) times daily. (Patient not taking: Reported  on 01/17/2023)   [DISCONTINUED] donepezil (ARICEPT) 5 MG tablet Take 5 mg by mouth at bedtime. (Patient not taking: Reported on 07/06/2022)   No facility-administered medications prior to visit.    Review of Systems  Constitutional:  Positive for fatigue. Negative for fever.  Respiratory:  Negative for cough and shortness of breath.   Cardiovascular:  Negative for chest pain and leg swelling.  Gastrointestinal:  Negative for abdominal pain.  Genitourinary:  Positive for dysuria, frequency and urgency.  Neurological:  Negative for dizziness and headaches.       Objective    BP (!) 170/85 (BP Location: Right Arm, Patient Position: Sitting, Cuff Size: Normal)   Pulse 80   Temp 98.4 F (36.9 C)   Ht 5' 7"$  (1.702 m)   Wt 183 lb (83 kg)   SpO2 96%   BMI 28.66 kg/m   Physical Exam Constitutional:      General: She is awake.     Appearance: She is well-developed.  HENT:     Head: Normocephalic.  Eyes:     Conjunctiva/sclera: Conjunctivae normal.  Cardiovascular:     Rate and Rhythm: Normal rate and regular rhythm.     Heart sounds: Normal heart sounds.  Pulmonary:     Effort: Pulmonary effort is normal.     Breath sounds: Normal breath sounds.  Abdominal:     Comments: Some left sided thoracic tenderness but no true CVA tenderness  Skin:    General:  Skin is warm.  Neurological:     Mental Status: She is alert and oriented to person, place, and time.  Psychiatric:        Attention and Perception: Attention normal.        Mood and Affect: Mood normal.        Speech: Speech normal.        Behavior: Behavior is cooperative.      No results found for any visits on 02/01/23.  Assessment & Plan     Acute cystitis Increase fluids Sent for culture Rx bactrim pobid x 5 days   Return in about 4 weeks (around 03/01/2023), or if symptoms worsen or fail to improve, for AVW.      I, Mikey Kirschner, PA-C have reviewed all documentation for this visit. The documentation on   02/01/23 for the exam, diagnosis, procedures, and orders are all accurate and complete.  Mikey Kirschner, PA-C [facility] [address] [place], [place], [address] Office: [phone] Fax: [place]

## 2023-02-04 LAB — SPECIMEN STATUS REPORT

## 2023-02-04 LAB — URINE CULTURE

## 2023-02-07 ENCOUNTER — Telehealth: Payer: Self-pay | Admitting: Physician Assistant

## 2023-02-07 ENCOUNTER — Other Ambulatory Visit: Payer: Self-pay | Admitting: Physician Assistant

## 2023-02-07 MED ORDER — CIPROFLOXACIN HCL 500 MG PO TABS: 500.0000 mg | ORAL_TABLET | Freq: Two times a day (BID) | ORAL | 0 refills | Status: AC

## 2023-02-07 NOTE — Telephone Encounter (Signed)
Patient advised. Verbalized understanding

## 2023-02-07 NOTE — Telephone Encounter (Signed)
Patient has taken all of the antibiotic you gave her for UTI. The symptoms have come back.  Wants to know what to do next.    She uses [facility].

## 2023-02-14 ENCOUNTER — Ambulatory Visit (INDEPENDENT_AMBULATORY_CARE_PROVIDER_SITE_OTHER): Payer: Medicare Other | Admitting: Physician Assistant

## 2023-02-14 ENCOUNTER — Other Ambulatory Visit: Payer: Self-pay | Admitting: Physician Assistant

## 2023-02-14 ENCOUNTER — Encounter: Payer: Self-pay | Admitting: Physician Assistant

## 2023-02-14 VITALS — BP 103/67 | HR 94 | Temp 98.6°F | Wt 178.3 lb

## 2023-02-14 DIAGNOSIS — U071 COVID-19: Secondary | ICD-10-CM

## 2023-02-14 DIAGNOSIS — R051 Acute cough: Secondary | ICD-10-CM | POA: Diagnosis not present

## 2023-02-14 LAB — POC COVID19 BINAXNOW: SARS Coronavirus 2 Ag: POSITIVE — AB

## 2023-02-14 MED ORDER — MOLNUPIRAVIR EUA 200MG CAPSULE: 4.0000 | ORAL_CAPSULE | Freq: Two times a day (BID) | ORAL | 0 refills | Status: DC

## 2023-02-14 MED ORDER — NIRMATRELVIR/RITONAVIR (PAXLOVID)TABLET: 3.0000 | ORAL_TABLET | Freq: Two times a day (BID) | ORAL | 0 refills | Status: AC

## 2023-02-14 MED ORDER — NEBULIZER MISC: 0 refills | Status: DC

## 2023-02-14 MED ORDER — IPRATROPIUM-ALBUTEROL 0.5-2.5 (3) MG/3ML IN SOLN: 3.0000 mL | Freq: Four times a day (QID) | RESPIRATORY_TRACT | 0 refills | Status: DC | PRN

## 2023-02-14 MED ORDER — NEBULIZER MASK ADULT MISC: 0 refills | Status: DC

## 2023-02-14 NOTE — Progress Notes (Signed)
I,Sha'taria Tyson,acting as a Education administrator for [employer], PA-C.,have documented all relevant documentation on the behalf of Mikey Kirschner, PA-C,as directed by  Mikey Kirschner, PA-C while in the presence of Mikey Kirschner, PA-C.   Established patient visit   [name]   DOB: Mar 23, 1946   77 y.o. Female  MRN: [id number] Visit Date: 02/14/2023  Today's healthcare provider: Mikey Kirschner, PA-C   Cc. Fatigue, cough, headache  Subjective    HPI   Pt reports x 2 days of fatigue, headache, coughing, SOB, sore throat. She reports taking otc cough/cold medicine. Tmax 101 F.   Medications: Outpatient Medications Prior to Visit  Medication Sig   acetaminophen (TYLENOL) 500 MG tablet Take 2 tablets (1,000 mg total) by mouth every 6 (six) hours.   albuterol (VENTOLIN HFA) 108 (90 Base) MCG/ACT inhaler Inhale 1-2 puffs into the lungs every 6 (six) hours as needed for wheezing or shortness of breath.   amLODipine (NORVASC) 5 MG tablet TAKE 1 TABLET EVERY DAY   Budeson-Glycopyrrol-Formoterol (BREZTRI AEROSPHERE) 160-9-4.8 MCG/ACT AERO Inhale 2 puffs into the lungs in the morning and at bedtime. Through [place] Patient Assistance   Calcium Carb-Cholecalciferol (CALCIUM 600+D3) 600-800 MG-UNIT TABS Take 1 tablet by mouth daily.   cholecalciferol (VITAMIN D) 25 MCG tablet Take 1 tablet (1,000 Units total) by mouth daily.   citalopram (CELEXA) 40 MG tablet Take 40 mg by mouth daily.   fexofenadine (ALLEGRA) 180 MG tablet Take 180 mg by mouth at bedtime.   LORazepam (ATIVAN) 0.5 MG tablet TAKE 1 TABLET BY MOUTH 3 TIMES DAILY AS NEEDED FOR ANXIETY   Melatonin 5 MG CHEW Chew 10 mg by mouth at bedtime.   Menthol, Topical Analgesic, (BIOFREEZE EX) Apply 1 application. topically daily as needed (pain).   mirtazapine (REMERON) 30 MG tablet Take 30 mg by mouth at bedtime.   primidone (MYSOLINE) 50 MG tablet Take 50 mg by mouth at bedtime.   rosuvastatin (CRESTOR) 40 MG tablet Take 1  tablet (40 mg total) by mouth at bedtime.   sulfamethoxazole-trimethoprim (BACTRIM DS) 800-160 MG tablet Take 1 tablet by mouth 2 (two) times daily.   vitamin B-12 (CYANOCOBALAMIN) 1000 MCG tablet Take 1,000 mcg by mouth daily.   No facility-administered medications prior to visit.    Review of Systems  Constitutional:  Positive for fatigue. Negative for fever.  HENT:  Positive for congestion and sore throat.   Respiratory:  Positive for cough, shortness of breath and wheezing.   Cardiovascular:  Negative for chest pain and leg swelling.  Gastrointestinal:  Negative for abdominal pain.  Neurological:  Negative for dizziness and headaches.      Objective    BP 103/67 (BP Location: Left Arm, Patient Position: Sitting, Cuff Size: Normal)   Pulse 94   Temp 98.6 F (37 C) (Oral)   Wt 178 lb 4.8 oz (80.9 kg)   SpO2 97%   BMI 27.93 kg/m   Physical Exam Constitutional:      General: She is awake.     Appearance: She is well-developed.  HENT:     Head: Normocephalic.  Eyes:     Conjunctiva/sclera: Conjunctivae normal.  Cardiovascular:     Rate and Rhythm: Normal rate and regular rhythm.     Heart sounds: Normal heart sounds.  Pulmonary:     Effort: Pulmonary effort is normal.     Breath sounds: Normal breath sounds. No wheezing, rhonchi or rales.  Skin:    General: Skin is warm.  Neurological:     Mental Status: She is alert and oriented to person, place, and time.  Psychiatric:        Attention and Perception: Attention normal.        Mood and Affect: Mood normal.        Speech: Speech normal.        Behavior: Behavior is cooperative.     Results for orders placed or performed in visit on 02/14/23  POC COVID-19  Result Value Ref Range   SARS Coronavirus 2 Ag Positive (A) Negative    Assessment & Plan     COVID 19 Poc covid + Rx antiviral, molnupiravir given multiple drug interactions w/ paxlovid Rx nebulizer and duoneb for help with SOB  Pt was fairly weak and  SOB during the visit despite stable vitals Gave strict ED precautions if nebulizer does not help or any symptoms progress  Return if symptoms worsen or fail to improve.      I, Mikey Kirschner, PA-C have reviewed all documentation for this visit. The documentation on  02/14/23 for the exam, diagnosis, procedures, and orders are all accurate and complete.  Mikey Kirschner, PA-C [facility] [address] [place], [place], [address] Office: [phone] Fax: [place]

## 2023-02-14 NOTE — Patient Instructions (Signed)
Antiviral Assistance:  [phone]

## 2023-02-28 ENCOUNTER — Telehealth: Payer: Self-pay | Admitting: Physician Assistant

## 2023-02-28 NOTE — Telephone Encounter (Signed)
Patient was diagnosed with Covid 2 weeks ago.  She said she is still very weak, breathing is better, does not feel like eating anything, been laying in bed X 2 weeks and has no energy.   Please advise.

## 2023-02-28 NOTE — Telephone Encounter (Signed)
Appointment scheduled.

## 2023-03-01 ENCOUNTER — Encounter: Payer: Self-pay | Admitting: Physician Assistant

## 2023-03-01 ENCOUNTER — Ambulatory Visit
Admission: RE | Admit: 2023-03-01 | Discharge: 2023-03-01 | Disposition: A | Discharge status: Home / Self Care | Payer: Medicare Other | Source: Ambulatory Visit | Attending: Physician Assistant | Admitting: Physician Assistant

## 2023-03-01 ENCOUNTER — Ambulatory Visit
Admission: RE | Admit: 2023-03-01 | Discharge: 2023-03-01 | Disposition: A | Discharge status: Home / Self Care | Payer: Medicare Other | Attending: Physician Assistant | Admitting: Physician Assistant

## 2023-03-01 ENCOUNTER — Ambulatory Visit (INDEPENDENT_AMBULATORY_CARE_PROVIDER_SITE_OTHER): Payer: Medicare Other | Admitting: Physician Assistant

## 2023-03-01 VITALS — BP 97/62 | HR 92 | Temp 98.1°F | Ht 67.0 in | Wt 176.0 lb

## 2023-03-01 DIAGNOSIS — R0602 Shortness of breath: Secondary | ICD-10-CM

## 2023-03-01 DIAGNOSIS — R531 Weakness: Secondary | ICD-10-CM

## 2023-03-01 NOTE — Progress Notes (Signed)
Established patient visit   [name]   DOB: Jul 18, 1946   77 y.o. Female  MRN: [id number] Visit Date: 03/01/2023  Today's healthcare provider: Mikey Kirschner, PA-C   Cc. Fatigue, weakness   Subjective    HPI Pt was dx with COVID 19 02/14/23, she has finished her antiviral therapy, she still attests to weakness, fatigue, headaches, diarrhea. Reports drinking adequate fluids but denies being hungry. Some days does not eat anything. Reports stopping her breztri inhaler, but still doing neb tx once daily.  Overall feels improved but still not well.  Pt reports at home her oxygen is around 93%.   Medications: Outpatient Medications Prior to Visit  Medication Sig   acetaminophen (TYLENOL) 500 MG tablet Take 2 tablets (1,000 mg total) by mouth every 6 (six) hours.   albuterol (VENTOLIN HFA) 108 (90 Base) MCG/ACT inhaler Inhale 1-2 puffs into the lungs every 6 (six) hours as needed for wheezing or shortness of breath.   amLODipine (NORVASC) 5 MG tablet TAKE 1 TABLET EVERY DAY   Budeson-Glycopyrrol-Formoterol (BREZTRI AEROSPHERE) 160-9-4.8 MCG/ACT AERO Inhale 2 puffs into the lungs in the morning and at bedtime. Through [employer] Patient Assistance   Calcium Carb-Cholecalciferol (CALCIUM 600+D3) 600-800 MG-UNIT TABS Take 1 tablet by mouth daily.   cholecalciferol (VITAMIN D) 25 MCG tablet Take 1 tablet (1,000 Units total) by mouth daily.   citalopram (CELEXA) 40 MG tablet Take 40 mg by mouth daily.   fexofenadine (ALLEGRA) 180 MG tablet Take 180 mg by mouth at bedtime.   ipratropium-albuterol (DUONEB) 0.5-2.5 (3) MG/3ML SOLN Take 3 mLs by nebulization every 6 (six) hours as needed.   LORazepam (ATIVAN) 0.5 MG tablet TAKE 1 TABLET BY MOUTH 3 TIMES DAILY AS NEEDED FOR ANXIETY   Melatonin 5 MG CHEW Chew 10 mg by mouth at bedtime.   Menthol, Topical Analgesic, (BIOFREEZE EX) Apply 1 application. topically daily as needed (pain).   mirtazapine (REMERON) 30 MG tablet Take 30  mg by mouth at bedtime.   Nebulizer MISC Use with nebulized medication q 6 hours   primidone (MYSOLINE) 50 MG tablet Take 50 mg by mouth at bedtime.   Respiratory Therapy Supplies (NEBULIZER MASK ADULT) MISC Use with nebulized medication q 6 hours   vitamin B-12 (CYANOCOBALAMIN) 1000 MCG tablet Take 1,000 mcg by mouth daily.   [DISCONTINUED] sulfamethoxazole-trimethoprim (BACTRIM DS) 800-160 MG tablet Take 1 tablet by mouth 2 (two) times daily.   rosuvastatin (CRESTOR) 40 MG tablet Take 1 tablet (40 mg total) by mouth at bedtime. (Patient not taking: Reported on 03/01/2023)   No facility-administered medications prior to visit.    Review of Systems  Constitutional:  Positive for fatigue. Negative for fever.  Respiratory:  Positive for shortness of breath. Negative for cough.   Cardiovascular:  Negative for chest pain and leg swelling.  Gastrointestinal:  Negative for abdominal pain.  Neurological:  Positive for weakness and headaches. Negative for dizziness.      Objective    BP 97/62 (BP Location: Right Arm, Patient Position: Sitting, Cuff Size: Normal)   Pulse 92   Temp 98.1 F (36.7 C)   Ht 5\' 7"  (1.702 m)   Wt 176 lb (79.8 kg)   SpO2 93%   BMI 27.57 kg/m   Physical Exam Constitutional:      General: She is awake.     Appearance: She is well-developed.  HENT:     Head: Normocephalic.  Eyes:     Conjunctiva/sclera: Conjunctivae normal.  Cardiovascular:     Rate and Rhythm: Normal rate and regular rhythm.     Heart sounds: Normal heart sounds.  Pulmonary:     Effort: Pulmonary effort is normal.     Breath sounds: Normal breath sounds. No wheezing, rhonchi or rales.  Skin:    General: Skin is warm.  Neurological:     Mental Status: She is alert and oriented to person, place, and time.  Psychiatric:        Attention and Perception: Attention normal.        Mood and Affect: Mood normal.        Speech: Speech normal.        Behavior: Behavior is cooperative.     No  results found for any visits on 03/01/23.  Assessment & Plan     Weakness, fatigue post COVID 19 Ordered cbc, cmp, chest xray O2 in office 93%  Advised pt would refer to pulm based on chest xray Advised I would expect her O2 to increase by now, to keep an eye at home Soft BP today-- advised if she sees  <100/70 to skip her amlodipine Continue pushing fluids and eat what she can  Return if symptoms worsen or fail to improve.      I, Mikey Kirschner, PA-C have reviewed all documentation for this visit. The documentation on  03/01/23 for the exam, diagnosis, procedures, and orders are all accurate and complete.  Mikey Kirschner, PA-C [facility] [address] [place], [place], [address] Office: [phone] Fax: [place]

## 2023-03-02 LAB — COMPREHENSIVE METABOLIC PANEL
ALT: 8 IU/L (ref 0–32)
AST: 12 IU/L (ref 0–40)
Albumin/Globulin Ratio: 1.6 (ref 1.2–2.2)
Albumin: 3.9 g/dL (ref 3.8–4.8)
Alkaline Phosphatase: 145 IU/L — ABNORMAL HIGH (ref 44–121)
BUN/Creatinine Ratio: 17 (ref 12–28)
BUN: 19 mg/dL (ref 8–27)
Bilirubin Total: 0.3 mg/dL (ref 0.0–1.2)
CO2: 21 mmol/L (ref 20–29)
Calcium: 9.4 mg/dL (ref 8.7–10.3)
Chloride: 107 mmol/L — ABNORMAL HIGH (ref 96–106)
Creatinine, Ser: 1.1 mg/dL — ABNORMAL HIGH (ref 0.57–1.00)
Globulin, Total: 2.5 g/dL (ref 1.5–4.5)
Glucose: 116 mg/dL — ABNORMAL HIGH (ref 70–99)
Potassium: 3.9 mmol/L (ref 3.5–5.2)
Sodium: 144 mmol/L (ref 134–144)
Total Protein: 6.4 g/dL (ref 6.0–8.5)
eGFR: 52 mL/min/{1.73_m2} — ABNORMAL LOW (ref >59)

## 2023-03-02 LAB — CBC WITH DIFFERENTIAL/PLATELET
Basophils Absolute: 0.1 10*3/uL (ref 0.0–0.2)
Basos: 1 %
EOS (ABSOLUTE): 0.2 10*3/uL (ref 0.0–0.4)
Eos: 3 %
Hematocrit: 45.6 % (ref 34.0–46.6)
Hemoglobin: 15.1 g/dL (ref 11.1–15.9)
Immature Grans (Abs): 0 10*3/uL (ref 0.0–0.1)
Immature Granulocytes: 0 %
Lymphocytes Absolute: 2.6 10*3/uL (ref 0.7–3.1)
Lymphs: 33 %
MCH: 29 pg (ref 26.6–33.0)
MCHC: 33.1 g/dL (ref 31.5–35.7)
MCV: 88 fL (ref 79–97)
Monocytes Absolute: 1 10*3/uL — ABNORMAL HIGH (ref 0.1–0.9)
Monocytes: 13 %
Neutrophils Absolute: 4 10*3/uL (ref 1.4–7.0)
Neutrophils: 50 %
Platelets: 290 10*3/uL (ref 150–450)
RBC: 5.2 x10E6/uL (ref 3.77–5.28)
RDW: 12.2 % (ref 11.7–15.4)
WBC: 7.9 10*3/uL (ref 3.4–10.8)

## 2023-03-03 ENCOUNTER — Other Ambulatory Visit: Payer: Self-pay | Admitting: Physician Assistant

## 2023-03-03 DIAGNOSIS — J4540 Moderate persistent asthma, uncomplicated: Secondary | ICD-10-CM

## 2023-03-03 DIAGNOSIS — U071 COVID-19: Secondary | ICD-10-CM

## 2023-03-21 DIAGNOSIS — N3000 Acute cystitis without hematuria: Secondary | ICD-10-CM | POA: Diagnosis not present

## 2023-03-23 ENCOUNTER — Encounter: Payer: Self-pay | Admitting: Pulmonary Disease

## 2023-03-23 ENCOUNTER — Ambulatory Visit (INDEPENDENT_AMBULATORY_CARE_PROVIDER_SITE_OTHER): Payer: Medicare Other | Admitting: Pulmonary Disease

## 2023-03-23 VITALS — BP 122/80 | HR 71 | Temp 97.1°F | Ht 67.0 in | Wt 183.2 lb

## 2023-03-23 DIAGNOSIS — J4489 Other specified chronic obstructive pulmonary disease: Secondary | ICD-10-CM | POA: Diagnosis not present

## 2023-03-23 DIAGNOSIS — J4530 Mild persistent asthma, uncomplicated: Secondary | ICD-10-CM

## 2023-03-23 DIAGNOSIS — R0602 Shortness of breath: Secondary | ICD-10-CM

## 2023-03-23 LAB — NITRIC OXIDE: Nitric Oxide: 21

## 2023-03-23 MED ORDER — BREZTRI AEROSPHERE 160-9-4.8 MCG/ACT IN AERO
2.0000 | INHALATION_SPRAY | Freq: Two times a day (BID) | RESPIRATORY_TRACT | 0 refills | Status: DC
Start: 2023-03-23 — End: 2023-05-24

## 2023-03-23 NOTE — Progress Notes (Signed)
Subjective:    Patient ID: Nancy Blackburn, female    DOB: [birth year], 77 y.o.   MRN: [id number] Patient Care Team: Alfredia Ferguson, PA-C as PCP - General (Physician Assistant) Debbe Odea, MD as PCP - Cardiology (Cardiology) Margaretann Loveless, PA-C as Physician Assistant (Family Medicine) Galen Manila, MD as Referring Physician (Ophthalmology) Lonell Face, MD as Consulting Physician (Neurology) Alfredo Martinez, MD as Consulting Physician (Urology) Tressie Stalker, MD as Consulting Physician (Neurosurgery) Darliss Ridgel, MD as Referring Physician (Psychiatry) Sharrell Ku, MD as Consulting Physician (Gastroenterology) Benita Gutter, RN as Oncology Nurse Navigator Gaspar Cola, [facility] (Pharmacist)  Requesting MD/Service: Julieanne Manson MD, Current PCP Dustin Flock, PA-C Date of initial consultation: 06/10/16 by Dr. Billy Fischer Reason for consultation: Dyspnea   PT PROFILE: 77 y.o. F, lifelong never smoker, referred for evaluation of unexplained exertional dyspnea.    DATA: 04/14/16 Echocardiogram : LVEF 60-65%, grade 2 diastolic dysfunction, LA mildly dilated 04/15/15 Myoview : no evidence of ischemia 07/14/16 PFTs : Normal spirometry, mild restriction, normal DLCO 07/14/16 6 MWT : 336 meters. Limited by arthritic pain in knees > dyspnea 07/03/2020 echocardiogram: EF 60 to 65%, grade 1 DD, there was evidence of increased right ventricular pressure 09/15/2020 long-term monitor: Frequent PVCs/ventricular ectopy, symptomatic 08/26/2021 PFTs: Essentially normal 09/28/2021 echocardiogram: LVEF 60 to 65% LVH grade 1 DD normal RV function, mildly elevated RV systolic pressure, mild MR, TR, aortic sclerosis.     Interim:  Prior visit was 21 December 2021 at that time she was advised to continue Breztri 2 puffs twice a day and as needed albuterol.  She was also to follow-up with cardiology.  Lost to follow-up until today, presents today with chief  complaint as below.  Chief Complaint  Patient presents with   Follow-up    Covid- 02/14/2023. SOB with exertion is worse since Covid. No wheezing or cough.   HPI Patient is a 77 year old lifelong never smoker who has been evaluated here previously for asthma/asthmatic bronchitis.  She also has been evaluated for the issue of dyspnea.  She has been lost to follow-up since January 2023.  She presents today because she had COVID-19 on 14 February 2023 and since then has noted shortness of breath on exertion that has been worse since the infection and sputum.  She has had no wheezing or cough.  No hemoptysis.  She does not endorse any orthopnea or paroxysmal nocturnal dyspnea.  No lower extremity edema.  No calf tenderness.  She had been maintained on Breztri 2 puffs twice a day for her asthma/asthmatic bronchitis.  Inexplicably she discontinued this medication 6 months ago.  During her recent COVID-19 illness she had to be provided with a nebulizer and DuoNeb.  She noted that this does alleviate her symptoms.  She rarely if ever uses albuterol rescue.  She has not had any fevers, chills or sweats since her COVID-19 episode.  She has not had any weight loss or anorexia.  She does not endorse any other symptomatology.  Review of Systems A 10 point review of systems was performed and it is as noted above otherwise negative.  Patient Active Problem List   Diagnosis Date Noted   Right ovarian cyst 03/02/2022   Cyst of right ovary    Cyst of left ovary    Acute pain of right knee    Elevated LFTs    Acute metabolic encephalopathy    Sepsis due to Proteus species 10/18/2021   Rhabdomyolysis 10/18/2021   Acute  kidney injury 10/18/2021   Leukocytosis 10/18/2021   Asthma 07/02/2021   S/P TKR (total knee replacement) using cement, right 03/09/2021   Bradycardia 07/03/2020   Postural dizziness with presyncope 07/01/2020   Family history of colon cancer in mother 02/22/2018   Cervical spondylosis 05/04/2017    Cervical radiculopathy 05/04/2017   Family history of Cushing disease 01/12/2017   B12 deficiency 12/18/2016   Mild cognitive impairment with memory loss 12/18/2016   Parkinsonian features 12/18/2016   Vitamin D deficiency 12/18/2016   Chalazion, bilateral 06/08/2016   Absolute anemia 07/30/2015   Edema extremities 07/30/2015   Benign essential tremor 07/30/2015   Anxiety, generalized 07/30/2015   Gastro-esophageal reflux disease without esophagitis 07/30/2015   HLD (hyperlipidemia) 07/30/2015   Cannot sleep 07/30/2015   Lumbar radiculopathy 07/30/2015   Depression, major, recurrent, in partial remission (HCC) 07/30/2015   Arthritis, degenerative 07/30/2015   Allergic rhinitis 07/30/2015   Gastroduodenal ulcer 07/30/2015   Calcium blood increased 07/30/2015   Pre-syncope 05/17/2015   Dyspnea 03/31/2015   Bleeding stomach ulcer 10/14/2013   Anemia, iron deficiency 10/14/2013   Weakness 10/14/2013   Ejection fraction    Pericardial effusion    Palpitations    Low back pain    Normal nuclear stress test    Social History   Tobacco Use   Smoking status: Never   Smokeless tobacco: Never  Substance Use Topics   Alcohol use: Yes    Comment: 1 glass of wine or mixed drink every 2-3 months   Allergies  Allergen Reactions   Anti-Inflammatory Enzyme [Nutritional Supplements] Other (See Comments)    REACTION: Due to bleeding ulcer   Cymbalta [Duloxetine Hcl] Other (See Comments)    REACTION: Urinary retention   Clarithromycin Other (See Comments)    Upsets stomach   Clarithromycin     GI upset   Donepezil Hcl Other (See Comments)    Significant bradycardia causing syncope   Nsaids     GI BLEED   Ranitidine Diarrhea    severe diarrhea   Venlafaxine     severe depression   Current Meds  Medication Sig   acetaminophen (TYLENOL) 500 MG tablet Take 2 tablets (1,000 mg total) by mouth every 6 (six) hours.   albuterol (VENTOLIN HFA) 108 (90 Base) MCG/ACT inhaler Inhale  1-2 puffs into the lungs every 6 (six) hours as needed for wheezing or shortness of breath.   amLODipine (NORVASC) 5 MG tablet TAKE 1 TABLET EVERY DAY   Calcium Carb-Cholecalciferol (CALCIUM 600+D3) 600-800 MG-UNIT TABS Take 1 tablet by mouth daily.   cholecalciferol (VITAMIN D) 25 MCG tablet Take 1 tablet (1,000 Units total) by mouth daily.   citalopram (CELEXA) 40 MG tablet Take 40 mg by mouth daily.   fexofenadine (ALLEGRA) 180 MG tablet Take 180 mg by mouth at bedtime.   ipratropium-albuterol (DUONEB) 0.5-2.5 (3) MG/3ML SOLN Take 3 mLs by nebulization every 6 (six) hours as needed.   LORazepam (ATIVAN) 0.5 MG tablet TAKE 1 TABLET BY MOUTH 3 TIMES DAILY AS NEEDED FOR ANXIETY   Menthol, Topical Analgesic, (BIOFREEZE EX) Apply 1 application. topically daily as needed (pain).   Nebulizer MISC Use with nebulized medication q 6 hours   primidone (MYSOLINE) 50 MG tablet Take 50 mg by mouth at bedtime.   QUEtiapine (SEROQUEL) 25 MG tablet Take 50 mg by mouth at bedtime.   Respiratory Therapy Supplies (NEBULIZER MASK ADULT) MISC Use with nebulized medication q 6 hours   rosuvastatin (CRESTOR) 40 MG tablet Take 1  tablet (40 mg total) by mouth at bedtime.   vitamin B-12 (CYANOCOBALAMIN) 1000 MCG tablet Take 1,000 mcg by mouth daily.   Immunization History  Administered Date(s) Administered   Fluad Quad(high Dose 65+) 10/21/2019, 10/14/2020, 08/25/2021, 10/05/2022   Influenza Inj Mdck Quad Pf 10/27/2016   Influenza Split 10/05/2011, 10/10/2012   Influenza, High Dose Seasonal PF 09/04/2014, 09/01/2015, 10/05/2017, 10/10/2018   Influenza-Unspecified 10/12/2013, 08/12/2014, 09/04/2014   Moderna Sars-Covid-2 Vaccination 02/06/2020, 03/03/2020, 11/11/2020   PFIZER(Purple Top)SARS-COV-2 Vaccination 02/06/2020, 03/03/2020, 11/11/2020   Pneumococcal Conjugate-13 03/26/2015   Pneumococcal Polysaccharide-23 10/11/1999, 04/10/2012   Td 08/23/1999   Tdap 05/20/2019   Tetanus 05/28/2019   Zoster, Live  09/26/2008       Objective:   Physical Exam BP 122/80 (BP Location: Left Arm, Cuff Size: Normal)   Pulse 71   Temp (!) 97.1 F (36.2 C)   Ht 5\' 7"  (1.702 m)   Wt 183 lb 3.2 oz (83.1 kg)   SpO2 96%   BMI 28.69 kg/m   SpO2: 96 % O2 Device: None (Room air)  GENERAL: Well-developed, well-nourished woman, chronically ill-appearing, no acute distress, no overt respiratory distress.  No conversational dyspnea per se.  Fully ambulatory without assistance.   HEAD: Normocephalic, atraumatic.  EYES: Pupils equal, round, reactive to light.  No scleral icterus.  Mild periorbital edema MOUTH: Dentition intact, oral mucosa moist.  No thrush. NECK: Supple. No thyromegaly. Trachea midline. No JVD.  No adenopathy. PULMONARY: Good air entry bilaterally.  No adventitious sounds. CARDIOVASCULAR: S1 and S2. Regular rate and rhythm.  Grade 2/6 holosystolic murmur, left sternal border. ABDOMEN: Mild truncal obesity otherwise benign. MUSCULOSKELETAL: No joint deformity, no clubbing, no edema.  NEUROLOGIC: Mild resting tremor, no overt focal deficit, gait normal.  SKIN: Intact,warm,dry. PSYCH: Normal mood, normal behavior.  Chest x-ray from 01 March 2023 showing no acute cardiopulmonary disease.  Mild linear scarring on the right lung as prior (unchanged):    Lab Results  Component Value Date   NITRICOXIDE 21 03/23/2023        Assessment & Plan:     ICD-10-CM   1. SOB (shortness of breath)  R06.02 Nitric oxide    Pulmonary Function Test [facility] Only    Nitric oxide   Reassess with PFTs FeNO today without indication of increased type II inflammation    2. Mild persistent asthma without complication  J45.30    Resume Breztri 2 puffs twice a day Continue as needed albuterol    3. Asthmatic bronchitis , chronic  J44.89    Breztri resumed as above     Orders Placed This Encounter  Procedures   Nitric oxide   Pulmonary Function Test [facility] Only    Standing Status:   Future    Standing  Expiration Date:   03/22/2024    Order Specific Question:   Full PFT: includes the following: basic spirometry, spirometry pre & post bronchodilator, diffusion capacity (DLCO), lung volumes    Answer:   Full PFT    Order Specific Question:   This test can only be performed at    Answer:   [facility]   Nitric oxide   Meds ordered this encounter  Medications   Budeson-Glycopyrrol-Formoterol (BREZTRI AEROSPHERE) 160-9-4.8 MCG/ACT AERO    Sig: Inhale 2 puffs into the lungs in the morning and at bedtime.    Dispense:  11.8 g    Refill:  0    Order Specific Question:   Lot Number?    Answer:   [id number]  Order Specific Question:   Expiration Date?    Answer:   08/12/2025    Order Specific Question:   Manufacturer?    Answer:   [employer] [[id number]]    Order Specific Question:   Quantity    Answer:   2   Will see the patient in follow-up in 6 weeks time she is to contact us prior to that time should any new difficulties arise.  Gailen Shelter. Laura Wadie Liew, MD Advanced Bronchoscopy PCCM [facility]    *This note was dictated using voice recognition software/Dragon.  Despite best efforts to proofread, errors can occur which can change the meaning. Any transcriptional errors that result from this process are unintentional and may not be fully corrected at the time of dictation.

## 2023-03-23 NOTE — Patient Instructions (Signed)
Please resume Breztri 2 puffs twice a day.  Make sure you rinse your mouth well after you use it.  We have provided you samples today.  We are ordering breathing tests.  We will see you in follow-up in 4 to 6 weeks time call sooner should any new problems arise.

## 2023-03-30 ENCOUNTER — Other Ambulatory Visit: Payer: Self-pay | Admitting: Physician Assistant

## 2023-03-30 ENCOUNTER — Ambulatory Visit: Payer: Medicare Other | Attending: Pulmonary Disease

## 2023-03-30 ENCOUNTER — Telehealth: Payer: Self-pay | Admitting: Physician Assistant

## 2023-03-30 DIAGNOSIS — F411 Generalized anxiety disorder: Secondary | ICD-10-CM

## 2023-03-30 DIAGNOSIS — R0602 Shortness of breath: Secondary | ICD-10-CM | POA: Diagnosis not present

## 2023-03-30 LAB — PULMONARY FUNCTION TEST ARMC ONLY
DL/VA % pred: 94 %
DL/VA: 3.78 ml/min/mmHg/L
DLCO unc % pred: 78 %
DLCO unc: 16.53 ml/min/mmHg
FEF 25-75 Post: 1.1 L/sec
FEF 25-75 Pre: 1.16 L/sec
FEF2575-%Change-Post: -4 %
FEF2575-%Pred-Post: 63 %
FEF2575-%Pred-Pre: 66 %
FEV1-%Change-Post: 2 %
FEV1-%Pred-Post: 74 %
FEV1-%Pred-Pre: 73 %
FEV1-Post: 1.75 L
FEV1-Pre: 1.7 L
FEV1FVC-%Change-Post: 5 %
FEV1FVC-%Pred-Pre: 90 %
FEV6-%Change-Post: -2 %
FEV6-%Pred-Post: 82 %
FEV6-%Pred-Pre: 84 %
FEV6-Post: 2.46 L
FEV6-Pre: 2.51 L
FEV6FVC-%Change-Post: 0 %
FEV6FVC-%Pred-Post: 105 %
FEV6FVC-%Pred-Pre: 104 %
FVC-%Change-Post: -2 %
FVC-%Pred-Post: 79 %
FVC-%Pred-Pre: 80 %
FVC-Post: 2.46 L
FVC-Pre: 2.52 L
Post FEV1/FVC ratio: 71 %
Post FEV6/FVC ratio: 100 %
Pre FEV1/FVC ratio: 68 %
Pre FEV6/FVC Ratio: 100 %
RV % pred: 117 %
RV: 2.91 L
TLC % pred: 102 %
TLC: 5.66 L

## 2023-03-30 MED ORDER — LORAZEPAM 0.5 MG PO TABS
0.5000 mg | ORAL_TABLET | Freq: Every day | ORAL | 0 refills | Status: DC | PRN
Start: 2023-03-30 — End: 2023-04-26

## 2023-03-30 NOTE — Telephone Encounter (Signed)
Patient requesting medication for anxiety not for shortness of breath. Please advise.

## 2023-03-30 NOTE — Telephone Encounter (Signed)
Patient had a breathing test today and she is kinda short of breath.   She said she gets like this at times and Dr. Sullivan Lone gave her Ativan to take for it but her refills have ran out.   Can you send her in some?

## 2023-03-31 NOTE — Telephone Encounter (Signed)
Patient advised. Verbalized understanding

## 2023-04-04 DIAGNOSIS — R350 Frequency of micturition: Secondary | ICD-10-CM | POA: Diagnosis not present

## 2023-04-12 ENCOUNTER — Encounter: Payer: Self-pay | Admitting: Physician Assistant

## 2023-04-12 ENCOUNTER — Ambulatory Visit (INDEPENDENT_AMBULATORY_CARE_PROVIDER_SITE_OTHER): Payer: Medicare Other | Admitting: Physician Assistant

## 2023-04-12 VITALS — BP 103/60 | HR 97 | Temp 99.6°F | Wt 176.3 lb

## 2023-04-12 DIAGNOSIS — R051 Acute cough: Secondary | ICD-10-CM

## 2023-04-12 DIAGNOSIS — J4489 Other specified chronic obstructive pulmonary disease: Secondary | ICD-10-CM | POA: Diagnosis not present

## 2023-04-12 LAB — POC COVID19 BINAXNOW: SARS Coronavirus 2 Ag: NEGATIVE

## 2023-04-12 MED ORDER — PREDNISONE 20 MG PO TABS
20.0000 mg | ORAL_TABLET | Freq: Every day | ORAL | 0 refills | Status: DC
Start: 2023-04-12 — End: 2024-09-13

## 2023-04-12 NOTE — Progress Notes (Signed)
I,Sha'taria Tyson,acting as a Neurosurgeon for [employer], PA-C.,have documented all relevant documentation on the behalf of Alfredia Ferguson, PA-C,as directed by  Alfredia Ferguson, PA-C while in the presence of Alfredia Ferguson, PA-C.   Established patient visit   [name]   DOB: [birth year]   77 y.o. Female  MRN: [id number] Visit Date: 04/12/2023  Today's healthcare provider: Alfredia Ferguson, PA-C   Cc. Cough, fever, sob  Subjective    Cough This is a new problem. The current episode started in the past 7 days (Monday). The problem has been unchanged. The problem occurs constantly. The cough is Productive of sputum (greenish). Associated symptoms include chills, a fever, myalgias, postnasal drip, a sore throat, shortness of breath, sweats and wheezing. The symptoms are aggravated by cold air. She has tried OTC cough suppressant for the symptoms. The treatment provided mild relief. Her past medical history is significant for asthma and bronchitis.    Patient reports she was treated for a Uti by urology and was given macrobid which she started Friday before her symptoms started. She stopped the abx 2 days ago.  Patient reports taking tylenol around 9:30- 10:00  am.  Medications: Outpatient Medications Prior to Visit  Medication Sig   acetaminophen (TYLENOL) 500 MG tablet Take 2 tablets (1,000 mg total) by mouth every 6 (six) hours.   albuterol (VENTOLIN HFA) 108 (90 Base) MCG/ACT inhaler Inhale 1-2 puffs into the lungs every 6 (six) hours as needed for wheezing or shortness of breath.   amLODipine (NORVASC) 5 MG tablet TAKE 1 TABLET EVERY DAY   Budeson-Glycopyrrol-Formoterol (BREZTRI AEROSPHERE) 160-9-4.8 MCG/ACT AERO Inhale 2 puffs into the lungs in the morning and at bedtime.   Calcium Carb-Cholecalciferol (CALCIUM 600+D3) 600-800 MG-UNIT TABS Take 1 tablet by mouth daily.   cholecalciferol (VITAMIN D) 25 MCG tablet Take 1 tablet (1,000 Units total) by mouth daily.    citalopram (CELEXA) 40 MG tablet Take 40 mg by mouth daily.   fexofenadine (ALLEGRA) 180 MG tablet Take 180 mg by mouth at bedtime.   ipratropium-albuterol (DUONEB) 0.5-2.5 (3) MG/3ML SOLN Take 3 mLs by nebulization every 6 (six) hours as needed.   LORazepam (ATIVAN) 0.5 MG tablet Take 1 tablet (0.5 mg total) by mouth daily as needed for anxiety.   Melatonin 5 MG CHEW Chew 10 mg by mouth at bedtime.   Menthol, Topical Analgesic, (BIOFREEZE EX) Apply 1 application. topically daily as needed (pain).   mirtazapine (REMERON) 30 MG tablet Take 30 mg by mouth at bedtime.   Nebulizer MISC Use with nebulized medication q 6 hours   primidone (MYSOLINE) 50 MG tablet Take 50 mg by mouth at bedtime.   QUEtiapine (SEROQUEL) 25 MG tablet Take 50 mg by mouth at bedtime.   Respiratory Therapy Supplies (NEBULIZER MASK ADULT) MISC Use with nebulized medication q 6 hours   rosuvastatin (CRESTOR) 40 MG tablet Take 1 tablet (40 mg total) by mouth at bedtime.   vitamin B-12 (CYANOCOBALAMIN) 1000 MCG tablet Take 1,000 mcg by mouth daily.   Budeson-Glycopyrrol-Formoterol (BREZTRI AEROSPHERE) 160-9-4.8 MCG/ACT AERO Inhale 2 puffs into the lungs in the morning and at bedtime. Through [place] Patient Assistance (Patient not taking: Reported on 04/12/2023)   nitrofurantoin (MACRODANTIN) 100 MG capsule Take 100 mg by mouth 2 (two) times daily. (Patient not taking: Reported on 04/12/2023)   No facility-administered medications prior to visit.    Review of Systems  Constitutional:  Positive for chills and fever.  HENT:  Positive for postnasal  drip and sore throat.   Respiratory:  Positive for cough, shortness of breath and wheezing.   Musculoskeletal:  Positive for myalgias.     Objective    BP 103/60 (BP Location: Right Arm, Patient Position: Sitting, Cuff Size: Normal)   Pulse 97   Temp 99.6 F (37.6 C) (Oral)   Wt 176 lb 4.8 oz (80 kg)   SpO2 95%   BMI 27.61 kg/m   Physical Exam Constitutional:      General: She  is awake.     Appearance: She is well-developed.  HENT:     Head: Normocephalic.  Eyes:     Conjunctiva/sclera: Conjunctivae normal.  Cardiovascular:     Rate and Rhythm: Normal rate and regular rhythm.     Heart sounds: Normal heart sounds.  Pulmonary:     Effort: Pulmonary effort is normal.     Breath sounds: Normal breath sounds. No wheezing, rhonchi or rales.  Skin:    General: Skin is warm.  Neurological:     Mental Status: She is alert and oriented to person, place, and time.  Psychiatric:        Attention and Perception: Attention normal.        Mood and Affect: Mood normal.        Speech: Speech normal.        Behavior: Behavior is cooperative.      No results found for any visits on 04/12/23.  Assessment & Plan     1. Acute cough Poc covid neg - POC COVID-19  2. Asthmatic bronchitis , chronic Lungs CTA, O2 95-96% RA.  Will given 20 mg prednisone x 5 days  Encouraged at home neb tx as these helped last time. Will reach out to pulm and advise them of exacerbation - predniSONE (DELTASONE) 20 MG tablet; Take 1 tablet (20 mg total) by mouth daily with breakfast.  Dispense: 5 tablet; Refill: 0   Return if symptoms worsen or fail to improve.      I, Alfredia Ferguson, PA-C have reviewed all documentation for this visit. The documentation on  04/12/23   for the exam, diagnosis, procedures, and orders are all accurate and complete.  Alfredia Ferguson, PA-C [facility] [address] [place], [place], [address] Office: [phone] Fax: [phone]   [facility]

## 2023-04-20 DIAGNOSIS — R350 Frequency of micturition: Secondary | ICD-10-CM | POA: Diagnosis not present

## 2023-04-20 DIAGNOSIS — N3946 Mixed incontinence: Secondary | ICD-10-CM | POA: Diagnosis not present

## 2023-04-24 DIAGNOSIS — F411 Generalized anxiety disorder: Secondary | ICD-10-CM | POA: Diagnosis not present

## 2023-04-24 DIAGNOSIS — F5105 Insomnia due to other mental disorder: Secondary | ICD-10-CM | POA: Diagnosis not present

## 2023-04-24 DIAGNOSIS — F331 Major depressive disorder, recurrent, moderate: Secondary | ICD-10-CM | POA: Diagnosis not present

## 2023-04-26 ENCOUNTER — Other Ambulatory Visit: Payer: Self-pay | Admitting: Physician Assistant

## 2023-04-26 DIAGNOSIS — F411 Generalized anxiety disorder: Secondary | ICD-10-CM

## 2023-05-04 ENCOUNTER — Ambulatory Visit: Payer: Medicare Other | Admitting: Adult Health

## 2023-05-06 ENCOUNTER — Emergency Department: Payer: Medicare Other

## 2023-05-06 ENCOUNTER — Emergency Department
Admission: EM | Admit: 2023-05-06 | Discharge: 2023-05-06 | Disposition: A | Discharge status: Home / Self Care | Payer: Medicare Other | Attending: Emergency Medicine | Admitting: Emergency Medicine

## 2023-05-06 ENCOUNTER — Other Ambulatory Visit: Payer: Self-pay

## 2023-05-06 DIAGNOSIS — Y9301 Activity, walking, marching and hiking: Secondary | ICD-10-CM | POA: Insufficient documentation

## 2023-05-06 DIAGNOSIS — W182XXA Fall in (into) shower or empty bathtub, initial encounter: Secondary | ICD-10-CM | POA: Diagnosis not present

## 2023-05-06 DIAGNOSIS — S0990XA Unspecified injury of head, initial encounter: Secondary | ICD-10-CM | POA: Insufficient documentation

## 2023-05-06 DIAGNOSIS — R29818 Other symptoms and signs involving the nervous system: Secondary | ICD-10-CM | POA: Diagnosis not present

## 2023-05-06 DIAGNOSIS — M4316 Spondylolisthesis, lumbar region: Secondary | ICD-10-CM | POA: Diagnosis not present

## 2023-05-06 DIAGNOSIS — M533 Sacrococcygeal disorders, not elsewhere classified: Secondary | ICD-10-CM | POA: Diagnosis not present

## 2023-05-06 DIAGNOSIS — N3000 Acute cystitis without hematuria: Secondary | ICD-10-CM

## 2023-05-06 DIAGNOSIS — Z043 Encounter for examination and observation following other accident: Secondary | ICD-10-CM | POA: Diagnosis not present

## 2023-05-06 LAB — CBC WITH DIFFERENTIAL/PLATELET
Abs Immature Granulocytes: 0.02 10*3/uL (ref 0.00–0.07)
Basophils Absolute: 0 10*3/uL (ref 0.0–0.1)
Basophils Relative: 0 %
Eosinophils Absolute: 0.3 10*3/uL (ref 0.0–0.5)
Eosinophils Relative: 3 %
HCT: 42 % (ref 36.0–46.0)
Hemoglobin: 13.4 g/dL (ref 12.0–15.0)
Immature Granulocytes: 0 %
Lymphocytes Relative: 21 %
Lymphs Abs: 2 10*3/uL (ref 0.7–4.0)
MCH: 28.3 pg (ref 26.0–34.0)
MCHC: 31.9 g/dL (ref 30.0–36.0)
MCV: 88.8 fL (ref 80.0–100.0)
Monocytes Absolute: 1.7 10*3/uL — ABNORMAL HIGH (ref 0.1–1.0)
Monocytes Relative: 18 %
Neutro Abs: 5.6 10*3/uL (ref 1.7–7.7)
Neutrophils Relative %: 58 %
Platelets: 293 10*3/uL (ref 150–400)
RBC: 4.73 MIL/uL (ref 3.87–5.11)
RDW: 14.1 % (ref 11.5–15.5)
WBC: 9.6 10*3/uL (ref 4.0–10.5)
nRBC: 0 % (ref 0.0–0.2)

## 2023-05-06 LAB — COMPREHENSIVE METABOLIC PANEL
ALT: 34 U/L (ref 0–44)
AST: 51 U/L — ABNORMAL HIGH (ref 15–41)
Albumin: 3.6 g/dL (ref 3.5–5.0)
Alkaline Phosphatase: 124 U/L (ref 38–126)
Anion gap: 9 (ref 5–15)
BUN: 28 mg/dL — ABNORMAL HIGH (ref 8–23)
CO2: 27 mmol/L (ref 22–32)
Calcium: 8.8 mg/dL — ABNORMAL LOW (ref 8.9–10.3)
Chloride: 100 mmol/L (ref 98–111)
Creatinine, Ser: 1.4 mg/dL — ABNORMAL HIGH (ref 0.44–1.00)
GFR, Estimated: 39 mL/min — ABNORMAL LOW (ref >60)
Glucose, Bld: 115 mg/dL — ABNORMAL HIGH (ref 70–99)
Potassium: 3.4 mmol/L — ABNORMAL LOW (ref 3.5–5.1)
Sodium: 136 mmol/L (ref 135–145)
Total Bilirubin: 0.7 mg/dL (ref 0.3–1.2)
Total Protein: 7.2 g/dL (ref 6.5–8.1)

## 2023-05-06 LAB — URINALYSIS, ROUTINE W REFLEX MICROSCOPIC
Bilirubin Urine: NEGATIVE
Glucose, UA: NEGATIVE mg/dL
Ketones, ur: NEGATIVE mg/dL
Nitrite: POSITIVE — AB
Protein, ur: 30 mg/dL — AB
Specific Gravity, Urine: 1.018 (ref 1.005–1.030)
pH: 5 (ref 5.0–8.0)

## 2023-05-06 LAB — LIPASE, BLOOD: Lipase: 24 U/L (ref 11–51)

## 2023-05-06 MED ORDER — SODIUM CHLORIDE 0.9 % IV SOLN
1.0000 g | Freq: Once | INTRAVENOUS | Status: AC
  Administered 2023-05-06: 1 g via INTRAVENOUS
  Filled 2023-05-06: qty 10

## 2023-05-06 MED ORDER — CEFDINIR 300 MG PO CAPS: 300.0000 mg | ORAL_CAPSULE | Freq: Two times a day (BID) | ORAL | 0 refills | Status: AC

## 2023-05-06 MED ORDER — CEFDINIR 300 MG PO CAPS: 300.0000 mg | ORAL_CAPSULE | Freq: Two times a day (BID) | ORAL | 0 refills | Status: DC

## 2023-05-06 MED ORDER — LORAZEPAM 1 MG PO TABS
0.5000 mg | ORAL_TABLET | Freq: Once | ORAL | Status: AC
  Administered 2023-05-06: 0.5 mg via ORAL
  Filled 2023-05-06: qty 1

## 2023-05-06 MED ORDER — SODIUM CHLORIDE 0.9 % IV BOLUS
1000.0000 mL | Freq: Once | INTRAVENOUS | Status: AC
  Administered 2023-05-06: 1000 mL via INTRAVENOUS

## 2023-05-06 NOTE — ED Provider Notes (Signed)
[facility] Provider Note  Patient Contact: 8:07 PM (approximate)   History   Fall (3 days ago)   HPI  Nancy Blackburn is a 77 y.o. female presents to the ED emergency department with some mild confusion for the past 3 days since patient had a mechanical fall.  Patient tripped walking into her shower and fell backwards onto her buttocks and did hit her head.  No loss of consciousness occurred after the initial injury and patient does not take a blood thinner.  [place] and has a mild sunburn.  No chest pain, chest tightness or abdominal pain.  No fever noted at home.  No other injuries according to patient's daughter.  No vomiting or diarrhea.  Patient denies headache.      Physical Exam   Triage Vital Signs: ED Triage Vitals  Enc Vitals Group     BP 05/06/23 1901 (!) 144/80     Pulse Rate 05/06/23 1901 90     Resp 05/06/23 1901 16     Temp 05/06/23 1901 98.2 F (36.8 C)     Temp src --      SpO2 05/06/23 1901 95 %     Weight 05/06/23 1903 188 lb (85.3 kg)     Height 05/06/23 1903 5\' 7"  (1.702 m)     Head Circumference --      Peak Flow --      Pain Score 05/06/23 1902 8     Pain Loc --      Pain Edu? --      Excl. in GC? --     Most recent vital signs: Vitals:   05/06/23 1901 05/06/23 2307  BP: (!) 144/80 122/64  Pulse: 90 84  Resp: 16 16  Temp: 98.2 F (36.8 C) 98.4 F (36.9 C)  SpO2: 95% 93%     General: Alert and in no acute distress. Eyes:  PERRL. EOMI. Head: No acute traumatic findings ENT:      Nose: No congestion/rhinnorhea.      Mouth/Throat: Mucous membranes are moist. Neck: No stridor. No cervical spine tenderness to palpation. Cardiovascular:  Good peripheral perfusion Respiratory: Normal respiratory effort without tachypnea or retractions. Lungs CTAB. Good air entry to the bases with no decreased or absent breath sounds. Gastrointestinal: Bowel sounds 4 quadrants.  Soft and nontender to palpation. No guarding or rigidity. No palpable masses. No distention. No CVA tenderness. Musculoskeletal: Full range of motion to all extremities.  Neurologic:  No gross focal neurologic deficits are appreciated.  Skin:   No rash noted    ED Results / Procedures / Treatments   Labs (all labs ordered are listed, but only abnormal results are displayed) Labs Reviewed  CBC WITH DIFFERENTIAL/PLATELET - Abnormal; Notable for the following components:      Result Value   Monocytes Absolute 1.7 (*)    All other components within normal limits  COMPREHENSIVE METABOLIC PANEL - Abnormal; Notable for the following components:   Potassium 3.4 (*)    Glucose, Bld 115 (*)    BUN 28 (*)    Creatinine, Ser 1.40 (*)    Calcium 8.8 (*)    AST 51 (*)    GFR, Estimated 39 (*)    All other components within normal limits  URINALYSIS, ROUTINE W REFLEX MICROSCOPIC - Abnormal; Notable for the following components:   Color, Urine YELLOW (*)    APPearance CLOUDY (*)  Hgb urine dipstick SMALL (*)    Protein, ur 30 (*)    Nitrite POSITIVE (*)    Leukocytes,Ua MODERATE (*)    Bacteria, UA RARE (*)    All other components within normal limits  URINE CULTURE  LIPASE, BLOOD       RADIOLOGY  I personally viewed and evaluated these images as part of my medical decision making, as well as reviewing the written report by the radiologist.  ED Provider Interpretation: CT head and cervical spine reassuring without acute abnormality.  MRI brain shows no acute intracranial abnormality.  X-rays of the lumbar spine and sacrum showed no acute bony abnormalities.   PROCEDURES:  Critical Care performed: No  Procedures   MEDICATIONS ORDERED IN ED: Medications  sodium chloride 0.9 % bolus 1,000 mL (0 mLs Intravenous Stopped 05/06/23 2215)  LORazepam (ATIVAN) tablet 0.5 mg (0.5 mg Oral Given 05/06/23 2038)  cefTRIAXone (ROCEPHIN) 1 g in sodium chloride 0.9 % 100 mL IVPB (0 g  Intravenous Stopped 05/06/23 2255)     IMPRESSION / MDM / ASSESSMENT AND PLAN / ED COURSE  I reviewed the triage vital signs and the nursing notes.                              Assessment and plan: Fall:   77 year old female presents to the emergency department with some mild confusion for the past 3 days after a fall occurred while patient was trying to take a shower.  Vital signs were reassuring at triage.  On exam, patient was alert and nontoxic-appearing.  She was able to provide historical details and did not seem overly confused.  She did seem unsteady while ambulating to the restroom and her daughter needed to help her.  Differential diagnosis included intracranial bleed, CVA, fracture of the lumbar spine/sacrum, UTI, electrolyte abnormality, dehydration...  Patient's creatinine increased from baseline at 1.4 from 1.1.  Urinalysis concerning for UTI with nitrites, moderate leuks and bacteria.  Urine culture in process.  CBC reassuring with normal white blood cell count.  CT head and cervical spine reassuring without acute abnormality.  MRI brain shows no acute intracranial abnormality.  X-rays of the lumbar spine and sacrum showed no acute bony abnormalities.  I discussed admission with patient and family member.  Family member feels comfortable taking patient home at this time for outpatient management.  Patient was given IV Rocephin in the emergency department as well as normal saline bolus and was discharged with Omnicef to be taken twice daily for the next 7 days.  I cautioned patient and family member that if symptoms seem to be worsening at home, to return to the emergency department for reevaluation and possible admission.  They have Easy Access to the emergency department should symptoms change at home.   FINAL CLINICAL IMPRESSION(S) / ED DIAGNOSES   Final diagnoses:  Acute cystitis without hematuria     Rx / DC Orders   ED Discharge Orders          Ordered     cefdinir (OMNICEF) 300 MG capsule  2 times daily,   Status:  Discontinued        05/06/23 2257    cefdinir (OMNICEF) 300 MG capsule  2 times daily        05/06/23 2301             Note:  This document was prepared using Dragon voice recognition software and may include  unintentional dictation errors.   Pia Mau Leonard, PA-C 05/06/23 2314    Jene Every, MD 05/07/23 [phone]

## 2023-05-06 NOTE — ED Notes (Signed)
Pt and pt's daughter verbalizes understanding of discharge instructions

## 2023-05-06 NOTE — Discharge Instructions (Addendum)
Take Omnicef twice daily for seven days.  °

## 2023-05-06 NOTE — ED Triage Notes (Addendum)
Pt to Ed from home for a fall on Wednesday night. Pt states "I fell over in the walk in shower and landed on my tail bone". Pt struck her head and did not have a LOC. Pt is CAOx4 and in no acute distress. Pt denies vision issues or headache. Daughter advised she has been "talking off the wall " and she is worried about a head injury

## 2023-05-06 NOTE — ED Notes (Signed)
Patient ambulated with an unsteady gait and one person-assist to the hallway bathroom. While in the bathroom, the patient felt she was "going backwards" at the sink and had to lean up against the wall. Patient was brought to the room via a wheelchair. Patient has tremors, but is alert and oriented.

## 2023-05-08 LAB — URINE CULTURE: Culture: >=100000 — AB

## 2023-05-09 LAB — URINE CULTURE

## 2023-05-10 ENCOUNTER — Telehealth: Payer: Self-pay | Admitting: *Deleted

## 2023-05-10 NOTE — Telephone Encounter (Signed)
Transition Care Management Follow-up Telephone Call Date of discharge and from where: [facility] 05/07/2023 How have you been since you were released from the hospital? Still having alot of pain in her hips and head is feeling better  Any questions or concerns? No  Items Reviewed: Did the pt receive and understand the discharge instructions provided? Yes  Medications obtained and verified? Yes  Other? No  Any new allergies since your discharge? Yes  Dietary orders reviewed? No Do you have support at home?  Follow up appointments reviewed:  PCP Hospital f/u appt confirmed? No  Patient says she will follow up with pcp but nothing scheduled yet Are transportation arrangements needed? Yes  If their condition worsens, is the pt aware to call PCP or go to the Emergency Dept.? Yes Was the patient provided with contact information for the PCP's office or ED? Yes Was to pt encouraged to call back with questions or concerns? Yes   Alois Cliche -Berneda [facility] [place], Population Health [phone] [address] [place] , [place] [address] Email : [email or web address]

## 2023-05-11 DIAGNOSIS — N3000 Acute cystitis without hematuria: Secondary | ICD-10-CM | POA: Diagnosis not present

## 2023-05-11 DIAGNOSIS — E538 Deficiency of other specified B group vitamins: Secondary | ICD-10-CM | POA: Diagnosis not present

## 2023-05-11 DIAGNOSIS — G3184 Mild cognitive impairment, so stated: Secondary | ICD-10-CM | POA: Diagnosis not present

## 2023-05-11 DIAGNOSIS — R55 Syncope and collapse: Secondary | ICD-10-CM | POA: Diagnosis not present

## 2023-05-11 DIAGNOSIS — G20C Parkinsonism, unspecified: Secondary | ICD-10-CM | POA: Diagnosis not present

## 2023-05-11 DIAGNOSIS — F3341 Major depressive disorder, recurrent, in partial remission: Secondary | ICD-10-CM | POA: Diagnosis not present

## 2023-05-11 DIAGNOSIS — N179 Acute kidney failure, unspecified: Secondary | ICD-10-CM | POA: Diagnosis not present

## 2023-05-11 DIAGNOSIS — E559 Vitamin D deficiency, unspecified: Secondary | ICD-10-CM | POA: Diagnosis not present

## 2023-05-11 DIAGNOSIS — G9339 Other post infection and related fatigue syndromes: Secondary | ICD-10-CM | POA: Diagnosis not present

## 2023-05-11 DIAGNOSIS — D508 Other iron deficiency anemias: Secondary | ICD-10-CM | POA: Diagnosis not present

## 2023-05-18 ENCOUNTER — Encounter: Payer: Self-pay | Admitting: Adult Health

## 2023-05-18 ENCOUNTER — Telehealth: Payer: Self-pay

## 2023-05-18 ENCOUNTER — Ambulatory Visit (INDEPENDENT_AMBULATORY_CARE_PROVIDER_SITE_OTHER): Payer: Medicare Other | Admitting: Adult Health

## 2023-05-18 VITALS — BP 124/80 | HR 91 | Temp 97.8°F | Ht 67.0 in | Wt 176.6 lb

## 2023-05-18 DIAGNOSIS — J301 Allergic rhinitis due to pollen: Secondary | ICD-10-CM

## 2023-05-18 DIAGNOSIS — J4530 Mild persistent asthma, uncomplicated: Secondary | ICD-10-CM

## 2023-05-18 MED ORDER — BREZTRI AEROSPHERE 160-9-4.8 MCG/ACT IN AERO: 2.0000 | INHALATION_SPRAY | Freq: Two times a day (BID) | RESPIRATORY_TRACT | 0 refills | Status: DC

## 2023-05-18 NOTE — Progress Notes (Signed)
@Patient  ID: Nancy Blackburn, female    DOB: [birth year], 77 y.o.   MRN: [id number]  Chief Complaint  Patient presents with   Follow-up    Referring provider: Alfredia Ferguson, Cordelia Poche  HPI: 77 year old female never smoker followed for asthma  TEST/EVENTS :  Echocardiogram 04/14/16: LVEF 60-65%, grade 2 diastolic dysfunction, LA mildly dilated Myoview 04/15/15: no evidence of ischemia PFTs 07/14/16: Normal spirometry, mild restriction, normal DLCO 6 MWT 07/14/16: 336 meters. Limited by arthritic pain in knees > dyspnea 2D echo July 2021 EF 60 to 65%, grade 1 diastolic dysfunction, normal pulmonary artery systolic pressure CT chest 03/2021  mild biapical scarring, negative PE, negative pulmonary nodules PFTs March 30, 2023 showed moderate obstruction with an FEV1 at 73%, ratio 68, FVC 80%, no significant bronchodilator response, DLCO 78%  05/18/2023 Follow up : Asthma  Patient returns for 2 month follow up .  Overall feels that she is doing some better.  She remains on Breztri twice daily.  Feels that it is working well.  PFTs and April 2024 showed mild to moderate obstruction with an FEV1 at 73% and ratio at 68.  Unfortunately insurance is not covering.  She is depending on samples.  We discussed looking into alternatives and also will look into patient assistance program.  Patient says that she does have chronic shortness of breath gets winded with some activities but seems to be better than usual.  She has some chronic allergies and takes Allegra daily which seems to be managing pretty well. She is followed by neurology for benign essential tremor with some mild parkinsonian features seems to be better on primidone.  She denies any flare of cough or wheezing.  No increased albuterol use.  Allergies  Allergen Reactions   Anti-Inflammatory Enzyme [Nutritional Supplements] Other (See Comments)    REACTION: Due to bleeding ulcer   Cymbalta [Duloxetine Hcl] Other (See Comments)     REACTION: Urinary retention   Clarithromycin Other (See Comments)    Upsets stomach   Clarithromycin     GI upset   Donepezil Hcl Other (See Comments)    Significant bradycardia causing syncope   Nsaids     GI BLEED   Ranitidine Diarrhea    severe diarrhea   Venlafaxine     severe depression    Immunization History  Administered Date(s) Administered   Fluad Quad(high Dose 65+) 10/21/2019, 10/14/2020, 08/25/2021, 10/05/2022   Influenza Inj Mdck Quad Pf 10/27/2016   Influenza Split 10/05/2011, 10/10/2012   Influenza, High Dose Seasonal PF 09/04/2014, 09/01/2015, 10/05/2017, 10/10/2018   Influenza-Unspecified 10/12/2013, 08/12/2014, 09/04/2014   Moderna Sars-Covid-2 Vaccination 02/06/2020, 03/03/2020, 11/11/2020   PFIZER(Purple Top)SARS-COV-2 Vaccination 02/06/2020, 03/03/2020, 11/11/2020   Pneumococcal Conjugate-13 03/26/2015   Pneumococcal Polysaccharide-23 10/11/1999, 04/10/2012   Td 08/23/1999   Tdap 05/20/2019   Tetanus 05/28/2019   Zoster, Live 09/26/2008    Past Medical History:  Diagnosis Date   Adnexal cyst 10/18/2021   a.) CT abdomen: large cystic mass in the LEFT hemipelvis measuring 8.5 cm. b.) Pelvis US 10/22/2021: adnexal cyst measuring 8.0 x 8.2 x 7.9 (volume 270 cm). c.) Pelvis US 01/17/2022: cystic mass measured 8.2 x 8.0 x 8.3 (volume 290 cm).   Anxiety    a.) on BZO (lorazepam) therapy PRN   Arthritis    Asthma    B12 deficiency    Benign essential tremor    Bleeding gastric ulcer    Bradycardia    Cervical cancer (HCC) 1978   Chronic sinus infection  Coronary artery disease    a.) CT chest 04/02/2021: coronary calcifications. b.) Lexi 01/21/2022: calcifications near [facility].   Depression    Diastolic dysfunction    a.) TTE 05/24/2012: EF 55-65%; triv MR, mild PR; G1DD. b.) TTE 05/24/2012: EF 55-65%; mild LVH; triv MR, mild TR/PR; G1DD. c.) TTE 04/15/2015: EF 60-65%; mild LVH; mild LA dilation; mild PR, triv TR; G2DD. d.) TTE 07/03/20 and 09/28/2021:  EF 60-65%; mild LVH; G1DD.   Fracture, sacrum/coccyx (HCC)    GERD (gastroesophageal reflux disease)    Hiatal hernia    History of kidney stones    Hyperlipidemia    Hypertension    Insomnia    a.) takes trazodone PRN   Low back pain    Mild dementia (HCC)    Nasal polyps    Normal nuclear stress test    a. 07/2005 Nl nuc stress test.   Osteopenia    Osteoporosis    Palpitations    a. June 2013:  Event recorder normal sinus rhythm with rare PVCs - ? symptomatic PVC's.   Parkinsonian features    a.) resting tremors; LEFT hemibody Parkinsonism. b.) takes primidone   Pericardial effusion    a.) TTE 05/24/2012: small. b.) TTE 04/15/2015: trivial. c.) TTE 09/28/2021: small.   Pneumonia    PONV (postoperative nausea and vomiting)    Thyroid nodule    Vitamin D deficiency     Tobacco History: Social History   Tobacco Use  Smoking Status Never  Smokeless Tobacco Never   Counseling given: Not Answered   Outpatient Medications Prior to Visit  Medication Sig Dispense Refill   acetaminophen (TYLENOL) 500 MG tablet Take 2 tablets (1,000 mg total) by mouth every 6 (six) hours. 60 tablet 0   albuterol (VENTOLIN HFA) 108 (90 Base) MCG/ACT inhaler Inhale 1-2 puffs into the lungs every 6 (six) hours as needed for wheezing or shortness of breath. 8 g 2   amLODipine (NORVASC) 5 MG tablet TAKE 1 TABLET EVERY DAY 90 tablet 3   Budeson-Glycopyrrol-Formoterol (BREZTRI AEROSPHERE) 160-9-4.8 MCG/ACT AERO Inhale 2 puffs into the lungs in the morning and at bedtime. 11.8 g 0   Calcium Carb-Cholecalciferol (CALCIUM 600+D3) 600-800 MG-UNIT TABS Take 1 tablet by mouth daily.     cholecalciferol (VITAMIN D) 25 MCG tablet Take 1 tablet (1,000 Units total) by mouth daily. 30 tablet 0   citalopram (CELEXA) 40 MG tablet Take 40 mg by mouth daily.     fexofenadine (ALLEGRA) 180 MG tablet Take 180 mg by mouth at bedtime.     ipratropium-albuterol (DUONEB) 0.5-2.5 (3) MG/3ML SOLN Take 3 mLs by nebulization  every 6 (six) hours as needed. 360 mL 0   LORazepam (ATIVAN) 0.5 MG tablet TAKE ONE TABLET (0.5 MG TOTAL) BY MOUTH DAILY AS NEEDED FOR ANXIETY. 30 tablet 3   Melatonin 5 MG CHEW Chew 10 mg by mouth at bedtime.     Menthol, Topical Analgesic, (BIOFREEZE EX) Apply 1 application. topically daily as needed (pain).     mirtazapine (REMERON) 30 MG tablet Take 30 mg by mouth at bedtime.     Nebulizer MISC Use with nebulized medication q 6 hours 1 each 0   nitrofurantoin (MACRODANTIN) 100 MG capsule Take 100 mg by mouth 2 (two) times daily.     predniSONE (DELTASONE) 20 MG tablet Take 1 tablet (20 mg total) by mouth daily with breakfast. 5 tablet 0   primidone (MYSOLINE) 50 MG tablet Take 50 mg by mouth at bedtime.  QUEtiapine (SEROQUEL) 25 MG tablet Take 50 mg by mouth at bedtime.     Respiratory Therapy Supplies (NEBULIZER MASK ADULT) MISC Use with nebulized medication q 6 hours 1 each 0   rosuvastatin (CRESTOR) 40 MG tablet Take 1 tablet (40 mg total) by mouth at bedtime. 90 tablet 3   vitamin B-12 (CYANOCOBALAMIN) 1000 MCG tablet Take 1,000 mcg by mouth daily.     Budeson-Glycopyrrol-Formoterol (BREZTRI AEROSPHERE) 160-9-4.8 MCG/ACT AERO Inhale 2 puffs into the lungs in the morning and at bedtime. Through [employer] Patient Assistance (Patient not taking: Reported on 04/12/2023) 32.1 g 3   No facility-administered medications prior to visit.     Review of Systems:   Constitutional:   No  weight loss, night sweats,  Fevers, chills, fatigue, or  lassitude.  HEENT:   No headaches,  Difficulty swallowing,  Tooth/dental problems, or  Sore throat,                No sneezing, itching, ear ache, nasal congestion, post nasal drip,   CV:  No chest pain,  Orthopnea, PND, swelling in lower extremities, anasarca, dizziness, palpitations, syncope.   GI  No heartburn, indigestion, abdominal pain, nausea, vomiting, diarrhea, change in bowel habits, loss of appetite, bloody stools.   Resp:   No chest wall  deformity  Skin: no rash or lesions.  GU: no dysuria, change in color of urine, no urgency or frequency.  No flank pain, no hematuria   MS:  No joint pain or swelling.  No decreased range of motion.  No back pain.    Physical Exam  BP 124/80 (BP Location: Left Arm, Cuff Size: Normal)   Pulse 91   Temp 97.8 F (36.6 C)   Ht 5\' 7"  (1.702 m)   Wt 176 lb 9.6 oz (80.1 kg)   SpO2 95%   BMI 27.66 kg/m   GEN: A/Ox3; pleasant , NAD, well nourished    HEENT:  [place]/AT,  NOSE-clear, THROAT-clear, no lesions, no postnasal drip or exudate noted.   NECK:  Supple w/ fair ROM; no JVD; normal carotid impulses w/o bruits; no thyromegaly or nodules palpated; no lymphadenopathy.    RESP  Clear  P & A; w/o, wheezes/ rales/ or rhonchi. no accessory muscle use, no dullness to percussion  CARD:  RRR, no m/r/g, no peripheral edema, pulses intact, no cyanosis or clubbing.  GI:   Soft & nt; nml bowel sounds; no organomegaly or masses detected.   Musco: Warm bil, no deformities or joint swelling noted.   Neuro: alert, no focal deficits noted.  Hand tremor bilaterally   Skin: Warm, no lesions or rashes    Lab Results:  CBC  BMET     Component Value Date/Time   PROBNP 293 07/29/2021 1522    Imaging:        Latest Ref Rng & Units 03/30/2023    3:29 PM 07/14/2016    1:47 PM  PFT Results  FVC-Pre L 2.52  2.56  P  FVC-Predicted Pre % 80  76  P  FVC-Post L 2.46  2.47  P  FVC-Predicted Post % 79  73  P  Pre FEV1/FVC % % 68  81  P  Post FEV1/FCV % % 71  83  P  FEV1-Pre L 1.70  2.06  P  FEV1-Predicted Pre % 73  81  P  FEV1-Post L 1.75  2.06  P  DLCO uncorrected ml/min/mmHg 16.53  23.35  P  DLCO UNC% % 78  82  P  DLVA Predicted % 94  64  P  TLC L 5.66    TLC % Predicted % 102    RV % Predicted % 117      P Preliminary result    Lab Results  Component Value Date   NITRICOXIDE 21 03/23/2023        Assessment & Plan:   Asthma Appears well-controlled.  Continue on current  maintenance regimen Will look into new patient assisted program for [facility].  May need to look at her formulary for alternatives  Allergic rhinitis Continue current regimen.     Rubye Oaks, NP 05/18/2023

## 2023-05-18 NOTE — Telephone Encounter (Signed)
Spoke to [employer] with [employer]. She stated that Trelegy is covered by insurance, however co pay is $548 and Markus Daft co pay is $546. Patient provided with [employer] patient assistance. She will complete it and return to our office.    Pharmacy team, can you assist with this? Is there an inhaler with a cheaper co pay?

## 2023-05-18 NOTE — Assessment & Plan Note (Signed)
Continue current regimen

## 2023-05-18 NOTE — Patient Instructions (Signed)
Continue on Breztri 2 puffs Twice daily   We will call pharmacy to see if Trelegy is covered in place of Breztri, if not we will need to send to pharmacy team what is covered.  Albuterol inhaler As needed   Follow up with with Dr. Jayme Cloud or Analicia Skibinski NP 4-6 months and As needed   Please contact office for sooner follow up if symptoms do not improve or worsen or seek emergency care

## 2023-05-18 NOTE — Assessment & Plan Note (Signed)
Appears well-controlled.  Continue on current maintenance regimen Will look into new patient assisted program for [facility].  May need to look at her formulary for alternatives

## 2023-05-19 ENCOUNTER — Other Ambulatory Visit (HOSPITAL_COMMUNITY): Payer: Self-pay

## 2023-05-19 NOTE — Telephone Encounter (Signed)
Test claims return the following and shows patient has a deductible that still needs to be met:   ICS+LABA  Advair Diskus Requires PA Advair HFA Requires PA Wixela Requires PA Breo $383.09 Dulera Requires PA Symbicort Requires PA  LAMA Incurse Requires PA Spiriva HH (Generic) Requires PA (Brand) $481.61 Spiriva Resp $496.05  LABA+LAMA Anoro Requires PA Stiolto $454.97 Bevespi $406.14  ICS Alvesco Requires PA Arnuity $528.41 Asmanex Requires PA Flovent (Generic) Requires PA Pulmicort Flex Requires PA Qvar Redihaler Requires PA

## 2023-05-19 NOTE — Telephone Encounter (Signed)
Routing to TP as an FYI 

## 2023-05-24 ENCOUNTER — Ambulatory Visit: Payer: Medicare Other | Attending: Cardiology | Admitting: Cardiology

## 2023-05-24 ENCOUNTER — Encounter: Payer: Self-pay | Admitting: Cardiology

## 2023-05-24 VITALS — BP 128/84 | HR 92 | Ht 67.0 in | Wt 180.2 lb

## 2023-05-24 DIAGNOSIS — I2510 Atherosclerotic heart disease of native coronary artery without angina pectoris: Secondary | ICD-10-CM | POA: Diagnosis not present

## 2023-05-24 DIAGNOSIS — I10 Essential (primary) hypertension: Secondary | ICD-10-CM | POA: Diagnosis not present

## 2023-05-24 DIAGNOSIS — E782 Mixed hyperlipidemia: Secondary | ICD-10-CM | POA: Diagnosis not present

## 2023-05-24 NOTE — Progress Notes (Signed)
Agree with the details of the visit as noted by Tammy Parrett, NP.  C. Laura Adrieanna Boteler, MD [facility] 

## 2023-05-24 NOTE — Progress Notes (Signed)
Cardiology Office Note:    Date:  05/24/2023   ID:  Nancy, Blackburn 09/16/1946, MRN [id number]  PCP:  Alfredia Ferguson, PA-C  [facility] Cardiologist:  Debbe Odea, MD  [facility] Electrophysiologist:  None   Referring MD: Alfredia Ferguson, PA-C   Chief Complaint  Patient presents with   Follow-up    Patient denies new or acute cardiac problems/concerns today.      History of Present Illness:    Nancy Blackburn is a 77 y.o. female with a hx of CAD (cor ca2+ on chest CT), hyperlipidemia, Parkinson's disease, mild cognitive impairment, who presents for follow-up.  Has been doing well from a cardiac perspective, denies chest pain or shortness of breath.  Had a recent syncopal event after contracting a UTI.  This is the second time this is happening.  She is planning on following up with her urologist.  Denies palpitations.  Compliant with Crestor as prescribed.  No new concerns.  Prior notes Lexiscan Myoview 09/2021 no significant ischemia. Echocardiogram 09/2021 EF 60 to 65%, impaired relaxation. She has a history of bleeding ulcers, asa, nsaids avoidance recommended by GI Patient was evaluated in the hospital from 7/21-7/23 due to dizziness, presyncope and bradycardia.  Prior to admission, she notes episodes of dizziness and passing out.  Saw her primary care on day of admission and was noted to be bradycardic with heart rate 38.  EKG in the hospital on 7/21 showed marked bradycardia heart rate 37.  Patient was also hypotensive with BP 91/58 upon chart review.  She used to be on propanolol and Aricept which were discontinued.  Echo  07/03/2020  EF 60 to 65%, impaired relaxation.   Her heart rates improved with subsequent EKG showing sinus bradycardia heart rate 55, and subsequently stayed consistently in the 70s after discontinuation of propanolol and Aricept.   Past Medical History:  Diagnosis Date   Adnexal cyst 10/18/2021   a.) CT abdomen: large cystic  mass in the LEFT hemipelvis measuring 8.5 cm. b.) Pelvis US 10/22/2021: adnexal cyst measuring 8.0 x 8.2 x 7.9 (volume 270 cm). c.) Pelvis US 01/17/2022: cystic mass measured 8.2 x 8.0 x 8.3 (volume 290 cm).   Anxiety    a.) on BZO (lorazepam) therapy PRN   Arthritis    Asthma    B12 deficiency    Benign essential tremor    Bleeding gastric ulcer    Bradycardia    Cervical cancer (HCC) 1978   Chronic sinus infection    Coronary artery disease    a.) CT chest 04/02/2021: coronary calcifications. b.) Lexi 01/21/2022: calcifications near [facility].   Depression    Diastolic dysfunction    a.) TTE 05/24/2012: EF 55-65%; triv MR, mild PR; G1DD. b.) TTE 05/24/2012: EF 55-65%; mild LVH; triv MR, mild TR/PR; G1DD. c.) TTE 04/15/2015: EF 60-65%; mild LVH; mild LA dilation; mild PR, triv TR; G2DD. d.) TTE 07/03/20 and 09/28/2021: EF 60-65%; mild LVH; G1DD.   Fracture, sacrum/coccyx (HCC)    GERD (gastroesophageal reflux disease)    Hiatal hernia    History of kidney stones    Hyperlipidemia    Hypertension    Insomnia    a.) takes trazodone PRN   Low back pain    Mild dementia (HCC)    Nasal polyps    Normal nuclear stress test    a. 07/2005 Nl nuc stress test.   Osteopenia    Osteoporosis    Palpitations    a. June 2013:  Event recorder normal sinus rhythm with rare PVCs - ? symptomatic PVC's.   Parkinsonian features    a.) resting tremors; LEFT hemibody Parkinsonism. b.) takes primidone   Pericardial effusion    a.) TTE 05/24/2012: small. b.) TTE 04/15/2015: trivial. c.) TTE 09/28/2021: small.   Pneumonia    PONV (postoperative nausea and vomiting)    Thyroid nodule    Vitamin D deficiency     Past Surgical History:  Procedure Laterality Date   BACK SURGERY  08/2010   BLADDER SUSPENSION  2003   bone spur  2009   removed from right collar bone area and partial collar bone removed   CATARACT EXTRACTION W/ INTRAOCULAR LENS  IMPLANT, BILATERAL     EYE SURGERY     LUMBAR FUSION   2011, 2012   ROBOTIC ASSISTED SALPINGO OOPHERECTOMY Bilateral 03/02/2022   Procedure: XI ROBOTIC ASSISTED SALPINGO OOPHORECTOMY;  Surgeon: Natale Milch, MD;  Location: [facility] ORS;  Service: Gynecology;  Laterality: Bilateral;   SHOULDER ARTHROSCOPY Right 2012   THYROIDECTOMY, PARTIAL  1976   TONSILLECTOMY     as child   TOTAL KNEE ARTHROPLASTY Right 03/09/2021   Procedure: Right TOTAL KNEE ARTHROPLASTY;  Surgeon: Juanell Fairly, MD;  Location: [facility] ORS;  Service: Orthopedics;  Laterality: Right;   TUBAL LIGATION  1977   VAGINAL HYSTERECTOMY  1978   d/t cervical cancer    Current Medications: Current Meds  Medication Sig   acetaminophen (TYLENOL) 500 MG tablet Take 2 tablets (1,000 mg total) by mouth every 6 (six) hours.   albuterol (VENTOLIN HFA) 108 (90 Base) MCG/ACT inhaler Inhale 1-2 puffs into the lungs every 6 (six) hours as needed for wheezing or shortness of breath.   amLODipine (NORVASC) 5 MG tablet TAKE 1 TABLET EVERY DAY   Budeson-Glycopyrrol-Formoterol (BREZTRI AEROSPHERE) 160-9-4.8 MCG/ACT AERO Inhale 2 puffs into the lungs in the morning and at bedtime.   Calcium Carb-Cholecalciferol (CALCIUM 600+D3) 600-800 MG-UNIT TABS Take 1 tablet by mouth daily.   cholecalciferol (VITAMIN D) 25 MCG tablet Take 1 tablet (1,000 Units total) by mouth daily.   citalopram (CELEXA) 40 MG tablet Take 40 mg by mouth daily.   fexofenadine (ALLEGRA) 180 MG tablet Take 180 mg by mouth at bedtime.   ipratropium-albuterol (DUONEB) 0.5-2.5 (3) MG/3ML SOLN Take 3 mLs by nebulization every 6 (six) hours as needed.   LORazepam (ATIVAN) 0.5 MG tablet TAKE ONE TABLET (0.5 MG TOTAL) BY MOUTH DAILY AS NEEDED FOR ANXIETY.   Menthol, Topical Analgesic, (BIOFREEZE EX) Apply 1 application. topically daily as needed (pain).   mirtazapine (REMERON) 30 MG tablet Take 30 mg by mouth at bedtime.   Nebulizer MISC Use with nebulized medication q 6 hours   primidone (MYSOLINE) 50 MG tablet Take 50 mg by mouth  at bedtime.   QUEtiapine (SEROQUEL) 25 MG tablet Take 50 mg by mouth at bedtime.   Respiratory Therapy Supplies (NEBULIZER MASK ADULT) MISC Use with nebulized medication q 6 hours   rosuvastatin (CRESTOR) 40 MG tablet Take 1 tablet (40 mg total) by mouth at bedtime.   vitamin B-12 (CYANOCOBALAMIN) 1000 MCG tablet Take 1,000 mcg by mouth daily.     Allergies:   Anti-inflammatory enzyme [nutritional supplements], Cymbalta [duloxetine hcl], Clarithromycin, Clarithromycin, Donepezil hcl, Nsaids, Ranitidine, and Venlafaxine   Social History   Socioeconomic History   Marital status: Divorced    Spouse name: Not on file   Number of children: 2   Years of education: Not on file   Highest education  level: 12th grade  Occupational History   Occupation: retired  Tobacco Use   Smoking status: Never   Smokeless tobacco: Never  Vaping Use   Vaping Use: Never used  Substance and Sexual Activity   Alcohol use: Yes    Comment: 1 glass of wine or mixed drink every 2-3 months   Drug use: No   Sexual activity: Never  Other Topics Concern   Not on file  Social History Narrative   Grandson lives with her   Social Determinants of Health   Financial Resource Strain: Low Risk  (01/17/2023)   Overall Financial Resource Strain (CARDIA)    Difficulty of Paying Living Expenses: Not hard at all  Food Insecurity: No Food Insecurity (01/17/2023)   Hunger Vital Sign    Worried About Running Out of Food in the Last Year: Never true    Ran Out of Food in the Last Year: Never true  Transportation Needs: No Transportation Needs (01/17/2023)   PRAPARE - Administrator, Civil Service (Medical): No    Lack of Transportation (Non-Medical): No  Physical Activity: Insufficiently Active (01/17/2023)   Exercise Vital Sign    Days of Exercise per Week: 2 days    Minutes of Exercise per Session: 60 min  Stress: Stress Concern Present (01/13/2022)   [employer] of Occupational Health - Occupational Stress  Questionnaire    Feeling of Stress : Rather much  Social Connections: Moderately Isolated (01/17/2023)   Social Connection and Isolation Panel [NHANES]    Frequency of Communication with Friends and Family: More than three times a week    Frequency of Social Gatherings with Friends and Family: More than three times a week    Attends Religious Services: More than 4 times per year    Active Member of [employer] or Organizations: No    Attends Engineer, structural: Never    Marital Status: Divorced     Family History: The patient's family history includes Angina in her mother; Atrial fibrillation in her brother; Breast cancer in her cousin; COPD in her father; Cancer in her daughter; Colon cancer in her mother; Cushing syndrome in her daughter; Heart disease in her father and mother; Heart murmur in her mother. There is no history of Hypotension, Malignant hyperthermia, or Pseudochol deficiency.  ROS:   Please see the history of present illness.     All other systems reviewed and are negative.  EKGs/Labs/Other Studies Reviewed:    The following studies were reviewed today:   EKG:  EKG is ordered today.  EKG shows normal sinus rhythm, heart rate 92.  Recent Labs: 05/06/2023: ALT 34; BUN 28; Creatinine, Ser 1.40; Hemoglobin 13.4; Platelets 293; Potassium 3.4; Sodium 136  Recent Lipid Panel    Component Value Date/Time   CHOL 172 02/23/2022 0940   CHOL 280 (H) 01/18/2022 1552   TRIG 197 (H) 02/23/2022 0940   HDL 50 02/23/2022 0940   HDL 45 01/18/2022 1552   CHOLHDL 3.4 02/23/2022 0940   VLDL 39 02/23/2022 0940   LDLCALC 83 02/23/2022 0940   LDLCALC 183 (H) 01/18/2022 1552   LDLDIRECT 186 (H) 01/18/2022 1552    Physical Exam:    VS:  BP 128/84 (BP Location: Left Arm, Patient Position: Sitting, Cuff Size: Normal)   Pulse 92   Ht 5\' 7"  (1.702 m)   Wt 180 lb 3.2 oz (81.7 kg)   SpO2 94%   BMI 28.22 kg/m     Wt Readings from Last 3  Encounters:  05/24/23 180 lb 3.2 oz (81.7  kg)  05/18/23 176 lb 9.6 oz (80.1 kg)  05/06/23 188 lb (85.3 kg)     GEN:  Well nourished, well developed in no acute distress, occasional tremors HEENT: Normal NECK: No JVD; No carotid bruits CARDIAC: RRR, no murmurs, rubs, gallops RESPIRATORY:  Clear to auscultation without rales, wheezing or rhonchi  ABDOMEN: Soft, non-tender, non-distended MUSCULOSKELETAL:  No edema; No deformity  SKIN: Warm and dry NEUROLOGIC:  Alert and oriented x 3, resting tremors noted PSYCHIATRIC:  Normal affect   ASSESSMENT:    1. Coronary artery disease involving native heart, unspecified vessel or lesion type, unspecified whether angina present   2. Mixed hyperlipidemia   3. Primary hypertension    PLAN:    In order of problems listed above:  Coronary artery calcification, denies chest pain, last EF 60 to 65%.  No aspirin due to history of bleeding ulcers.  Continue Crestor 40 mg. hyperlipidemia, LDL controlled, triglycerides slightly elevated.  Continue Crestor 40 mg daily. Hypertension, BP controlled.  Continue Norvasc 5 mg daily.  Follow-up in 12 months   Medication Adjustments/Labs and Tests Ordered: Current medicines are reviewed at length with the patient today.  Concerns regarding medicines are outlined above.  Orders Placed This Encounter  Procedures   EKG 12-Lead    No orders of the defined types were placed in this encounter.    Patient Instructions  Medication Instructions:   None Ordered  *If you need a refill on your cardiac medications before your next appointment, please call your pharmacy*   Lab Work:  None Ordered  If you have labs (blood work) drawn today and your tests are completely normal, you will receive your results only by: MyChart Message (if you have MyChart) OR A paper copy in the mail If you have any lab test that is abnormal or we need to change your treatment, we will call you to review the results.   Testing/Procedures:  None  Ordered   Follow-Up: At [facility], you and your health needs are our priority.  As part of our continuing mission to provide you with exceptional heart care, we have created designated Provider Care Teams.  These Care Teams include your primary Cardiologist (physician) and Advanced Practice Providers (APPs -  Physician Assistants and Nurse Practitioners) who all work together to provide you with the care you need, when you need it.  We recommend signing up for the patient portal called "MyChart".  Sign up information is provided on this After Visit Summary.  MyChart is used to connect with patients for Virtual Visits (Telemedicine).  Patients are able to view lab/test results, encounter notes, upcoming appointments, etc.  Non-urgent messages can be sent to your provider as well.   To learn more about what you can do with MyChart, go to [email or web address].    Your next appointment:   12 month(s)  Provider:   You may see Debbe Odea, MD or one of the following Advanced Practice Providers on your designated Care Team:   Nicolasa Ducking, NP Eula Listen, PA-C Cadence Fransico Michael, PA-C Charlsie Quest, NP    Signed, Debbe Odea, MD  05/24/2023 11:54 AM    [facility]

## 2023-05-24 NOTE — Patient Instructions (Signed)
Medication Instructions:   None Ordered  *If you need a refill on your cardiac medications before your next appointment, please call your pharmacy*   Lab Work:  None Ordered  If you have labs (blood work) drawn today and your tests are completely normal, you will receive your results only by: MyChart Message (if you have MyChart) OR A paper copy in the mail If you have any lab test that is abnormal or we need to change your treatment, we will call you to review the results.   Testing/Procedures:  None Ordered   Follow-Up: At [facility], you and your health needs are our priority.  As part of our continuing mission to provide you with exceptional heart care, we have created designated Provider Care Teams.  These Care Teams include your primary Cardiologist (physician) and Advanced Practice Providers (APPs -  Physician Assistants and Nurse Practitioners) who all work together to provide you with the care you need, when you need it.  We recommend signing up for the patient portal called "MyChart".  Sign up information is provided on this After Visit Summary.  MyChart is used to connect with patients for Virtual Visits (Telemedicine).  Patients are able to view lab/test results, encounter notes, upcoming appointments, etc.  Non-urgent messages can be sent to your provider as well.   To learn more about what you can do with MyChart, go to [email or web address].    Your next appointment:   12 month(s)  Provider:   You may see Brian Agbor-Etang, MD or one of the following Advanced Practice Providers on your designated Care Team:   Christopher Berge, NP Ryan Dunn, PA-C Cadence Furth, PA-C Sheri Hammock, NP  

## 2023-06-14 ENCOUNTER — Ambulatory Visit: Payer: Medicare Other

## 2023-06-14 VITALS — Ht 67.0 in | Wt 180.0 lb

## 2023-06-14 DIAGNOSIS — Z0000 Encounter for general adult medical examination without abnormal findings: Secondary | ICD-10-CM

## 2023-06-14 NOTE — Progress Notes (Signed)
Subjective:   Nancy Blackburn is a 77 y.o. female who presents for Medicare Annual (Subsequent) preventive examination.  Visit Complete: Virtual  I connected with  [name] on 06/14/23 by a audio enabled telemedicine application and verified that I am speaking with the correct person using two identifiers.  Patient Location: Home  Provider Location: Home Office  I discussed the limitations of evaluation and management by telemedicine. The patient expressed understanding and agreed to proceed.  Patient Medicare AWV questionnaire was completed by the patient on (not done); I have confirmed that all information answered by patient is correct and no changes since this date.  Review of Systems    Cardiac Risk Factors include: advanced age (>86men, >83 women);dyslipidemia    Objective:    Today's Vitals   06/14/23 1112  Weight: 180 lb (81.6 kg)  Height: 5\' 7"  (1.702 m)   Body mass index is 28.19 kg/m.     06/14/2023   11:30 AM 05/06/2023    7:05 PM 01/17/2023    2:20 PM 03/02/2022    6:22 AM 02/22/2022    8:42 AM 01/13/2022    2:27 PM 12/01/2021    1:03 PM  Advanced Directives  Does Patient Have a Medical Advance Directive? Yes No Yes Yes Yes No Yes  Type of Estate agent of [place];Living will  Healthcare Power of [place];Living will Living will;Healthcare Power of [employer] Power of Attorney  Living will;Healthcare Power of Attorney  Does patient want to make changes to medical advance directive?    No - Patient declined   Yes (ED - Information included in AVS)  Copy of Healthcare Power of Attorney in Chart?   No - copy requested No - copy requested Yes - validated most recent copy scanned in chart (See row information)    Would patient like information on creating a medical advance directive?    No - Patient declined  No - Patient declined     Current Medications (verified) Outpatient Encounter Medications as of 06/14/2023   Medication Sig   acetaminophen (TYLENOL) 500 MG tablet Take 2 tablets (1,000 mg total) by mouth every 6 (six) hours.   albuterol (VENTOLIN HFA) 108 (90 Base) MCG/ACT inhaler Inhale 1-2 puffs into the lungs every 6 (six) hours as needed for wheezing or shortness of breath.   amLODipine (NORVASC) 5 MG tablet TAKE 1 TABLET EVERY DAY   Budeson-Glycopyrrol-Formoterol (BREZTRI AEROSPHERE) 160-9-4.8 MCG/ACT AERO Inhale 2 puffs into the lungs in the morning and at bedtime.   Calcium Carb-Cholecalciferol (CALCIUM 600+D3) 600-800 MG-UNIT TABS Take 1 tablet by mouth daily.   cholecalciferol (VITAMIN D) 25 MCG tablet Take 1 tablet (1,000 Units total) by mouth daily.   citalopram (CELEXA) 40 MG tablet Take 40 mg by mouth daily.   fexofenadine (ALLEGRA) 180 MG tablet Take 180 mg by mouth at bedtime.   ipratropium-albuterol (DUONEB) 0.5-2.5 (3) MG/3ML SOLN Take 3 mLs by nebulization every 6 (six) hours as needed.   LORazepam (ATIVAN) 0.5 MG tablet TAKE ONE TABLET (0.5 MG TOTAL) BY MOUTH DAILY AS NEEDED FOR ANXIETY.   Melatonin 5 MG CHEW Chew 10 mg by mouth at bedtime. (Patient not taking: Reported on 05/24/2023)   Menthol, Topical Analgesic, (BIOFREEZE EX) Apply 1 application. topically daily as needed (pain).   mirtazapine (REMERON) 30 MG tablet Take 30 mg by mouth at bedtime.   Nebulizer MISC Use with nebulized medication q 6 hours   nitrofurantoin (MACRODANTIN) 100 MG capsule Take 100 mg  by mouth 2 (two) times daily. (Patient not taking: Reported on 05/24/2023)   predniSONE (DELTASONE) 20 MG tablet Take 1 tablet (20 mg total) by mouth daily with breakfast. (Patient not taking: Reported on 05/24/2023)   primidone (MYSOLINE) 50 MG tablet Take 50 mg by mouth at bedtime.   QUEtiapine (SEROQUEL) 25 MG tablet Take 50 mg by mouth at bedtime.   Respiratory Therapy Supplies (NEBULIZER MASK ADULT) MISC Use with nebulized medication q 6 hours   rosuvastatin (CRESTOR) 40 MG tablet Take 1 tablet (40 mg total) by mouth at  bedtime.   vitamin B-12 (CYANOCOBALAMIN) 1000 MCG tablet Take 1,000 mcg by mouth daily.   No facility-administered encounter medications on file as of 06/14/2023.    Allergies (verified) Anti-inflammatory enzyme [nutritional supplements], Cymbalta [duloxetine hcl], Clarithromycin, Clarithromycin, Donepezil hcl, Nsaids, Ranitidine, and Venlafaxine   History: Past Medical History:  Diagnosis Date   Adnexal cyst 10/18/2021   a.) CT abdomen: large cystic mass in the LEFT hemipelvis measuring 8.5 cm. b.) Pelvis US 10/22/2021: adnexal cyst measuring 8.0 x 8.2 x 7.9 (volume 270 cm). c.) Pelvis US 01/17/2022: cystic mass measured 8.2 x 8.0 x 8.3 (volume 290 cm).   Anxiety    a.) on BZO (lorazepam) therapy PRN   Arthritis    Asthma    B12 deficiency    Benign essential tremor    Bleeding gastric ulcer    Bradycardia    Cervical cancer (HCC) 1978   Chronic sinus infection    Coronary artery disease    a.) CT chest 04/02/2021: coronary calcifications. b.) Lexi 01/21/2022: calcifications near [facility].   Depression    Diastolic dysfunction    a.) TTE 05/24/2012: EF 55-65%; triv MR, mild PR; G1DD. b.) TTE 05/24/2012: EF 55-65%; mild LVH; triv MR, mild TR/PR; G1DD. c.) TTE 04/15/2015: EF 60-65%; mild LVH; mild LA dilation; mild PR, triv TR; G2DD. d.) TTE 07/03/20 and 09/28/2021: EF 60-65%; mild LVH; G1DD.   Fracture, sacrum/coccyx (HCC)    GERD (gastroesophageal reflux disease)    Hiatal hernia    History of kidney stones    Hyperlipidemia    Hypertension    Insomnia    a.) takes trazodone PRN   Low back pain    Mild dementia (HCC)    Nasal polyps    Normal nuclear stress test    a. 07/2005 Nl nuc stress test.   Osteopenia    Osteoporosis    Palpitations    a. June 2013:  Event recorder normal sinus rhythm with rare PVCs - ? symptomatic PVC's.   Parkinsonian features    a.) resting tremors; LEFT hemibody Parkinsonism. b.) takes primidone   Pericardial effusion    a.) TTE 05/24/2012:  small. b.) TTE 04/15/2015: trivial. c.) TTE 09/28/2021: small.   Pneumonia    PONV (postoperative nausea and vomiting)    Thyroid nodule    Vitamin D deficiency    Past Surgical History:  Procedure Laterality Date   BACK SURGERY  08/2010   BLADDER SUSPENSION  2003   bone spur  2009   removed from right collar bone area and partial collar bone removed   CATARACT EXTRACTION W/ INTRAOCULAR LENS  IMPLANT, BILATERAL     EYE SURGERY     LUMBAR FUSION  2011, 2012   ROBOTIC ASSISTED SALPINGO OOPHERECTOMY Bilateral 03/02/2022   Procedure: XI ROBOTIC ASSISTED SALPINGO OOPHORECTOMY;  Surgeon: Natale Milch, MD;  Location: [facility] ORS;  Service: Gynecology;  Laterality: Bilateral;   SHOULDER ARTHROSCOPY Right 2012  THYROIDECTOMY, PARTIAL  1976   TONSILLECTOMY     as child   TOTAL KNEE ARTHROPLASTY Right 03/09/2021   Procedure: Right TOTAL KNEE ARTHROPLASTY;  Surgeon: Juanell Fairly, MD;  Location: [facility] ORS;  Service: Orthopedics;  Laterality: Right;   TUBAL LIGATION  1977   VAGINAL HYSTERECTOMY  1978   d/t cervical cancer   Family History  Problem Relation Age of Onset   Colon cancer Mother    Heart murmur Mother    Heart disease Mother    Angina Mother    Heart disease Father    COPD Father    Atrial fibrillation Brother    Cushing syndrome Daughter    Cancer Daughter        thyroid   Breast cancer Cousin    Hypotension Neg Hx    Malignant hyperthermia Neg Hx    Pseudochol deficiency Neg Hx    Social History   Socioeconomic History   Marital status: Divorced    Spouse name: Not on file   Number of children: 2   Years of education: Not on file   Highest education level: 12th grade  Occupational History   Occupation: retired  Tobacco Use   Smoking status: Never   Smokeless tobacco: Never  Vaping Use   Vaping Use: Never used  Substance and Sexual Activity   Alcohol use: Yes    Comment: 1 glass of wine or mixed drink every 2-3 months   Drug use: No   Sexual  activity: Never  Other Topics Concern   Not on file  Social History Narrative   Grandson lives with her   Social Determinants of Health   Financial Resource Strain: Low Risk  (06/14/2023)   Overall Financial Resource Strain (CARDIA)    Difficulty of Paying Living Expenses: Not hard at all  Food Insecurity: No Food Insecurity (01/17/2023)   Hunger Vital Sign    Worried About Running Out of Food in the Last Year: Never true    Ran Out of Food in the Last Year: Never true  Transportation Needs: No Transportation Needs (06/14/2023)   PRAPARE - Administrator, Civil Service (Medical): No    Lack of Transportation (Non-Medical): No  Physical Activity: Insufficiently Active (06/14/2023)   Exercise Vital Sign    Days of Exercise per Week: 2 days    Minutes of Exercise per Session: 60 min  Stress: No Stress Concern Present (06/14/2023)   [employer] of Occupational Health - Occupational Stress Questionnaire    Feeling of Stress : Not at all  Social Connections: Moderately Isolated (06/14/2023)   Social Connection and Isolation Panel [NHANES]    Frequency of Communication with Friends and Family: More than three times a week    Frequency of Social Gatherings with Friends and Family: More than three times a week    Attends Religious Services: More than 4 times per year    Active Member of [employer] or Organizations: No    Attends Engineer, structural: Never    Marital Status: Divorced    Tobacco Counseling Counseling given: Not Answered   Clinical Intake:  Pre-visit preparation completed: Yes  Pain : No/denies pain     BMI - recorded: 28.19 Nutritional Status: BMI 25 -29 Overweight Nutritional Risks: None Diabetes: No  How often do you need to have someone help you when you read instructions, pamphlets, or other written materials from your doctor or pharmacy?: 1 - Never  Interpreter Needed?: No  Comments: daughter  lives with pt Information entered by ::  B.Tashona Calk,LPN   Activities of Daily Living    06/14/2023   11:22 AM 01/17/2023    2:23 PM  In your present state of health, do you have any difficulty performing the following activities:  Hearing? 0 0  Vision? 0 0  Difficulty concentrating or making decisions? 0 0  Walking or climbing stairs? 0 1  Comment  Had Knee replacement and still bothers pt.  Dressing or bathing? 0 0  Doing errands, shopping? 0 0  Preparing Food and eating ? N N  Using the Toilet? N N  In the past six months, have you accidently leaked urine? Y Y  Comment  Occasional - sees urologist  Do you have problems with loss of bowel control? N N  Managing your Medications? N N  Managing your Finances? N N  Housekeeping or managing your Housekeeping? N N    Patient Care Team: Alfredia Ferguson, PA-C as PCP - General (Physician Assistant) Debbe Odea, MD as PCP - Cardiology (Cardiology) Margaretann Loveless, PA-C as Physician Assistant (Family Medicine) Galen Manila, MD as Referring Physician (Ophthalmology) Lonell Face, MD as Consulting Physician (Neurology) Alfredo Martinez, MD as Consulting Physician (Urology) Tressie Stalker, MD as Consulting Physician (Neurosurgery) Darliss Ridgel, MD as Referring Physician (Psychiatry) Sharrell Ku, MD as Consulting Physician (Gastroenterology) Benita Gutter, RN as Oncology Nurse Navigator Gaspar Cola, [facility] (Inactive) (Pharmacist)  Indicate any recent Medical Services you may have received from other than [facility] providers in the past year (date may be approximate).     Assessment:   This is a routine wellness examination for [place].  Hearing/Vision screen Hearing Screening - Comments:: Adequate hearing Vision Screening - Comments:: Adequate vision ; readers only Dr Lillie Fragmin  Dietary issues and exercise activities discussed:     Goals Addressed   None    Depression Screen    06/14/2023   11:20 AM 01/17/2023    2:16 PM 01/13/2022     2:24 PM 11/22/2021    2:44 PM 02/16/2021   11:28 AM 12/17/2019    2:26 PM 04/10/2019    2:01 PM  PHQ 2/9 Scores  PHQ - 2 Score 0 0 0 1 0 0 1  PHQ- 9 Score  1  2 0 4 3    Fall Risk    06/14/2023   11:15 AM 01/17/2023    2:23 PM 01/13/2022    2:28 PM 11/25/2021    1:15 PM 04/14/2020    1:27 PM  Fall Risk   Falls in the past year? 1 0 1 1 0  Number falls in past yr: 0 0 0 1 0  Injury with Fall? 1 0 1 1 0  Comment fell hit head went to ER      Risk for fall due to : No Fall Risks No Fall Risks History of fall(s) History of fall(s);Impaired balance/gait   Follow up Education provided;Falls prevention discussed Falls prevention discussed;Falls evaluation completed Falls prevention discussed Falls prevention discussed     MEDICARE RISK AT HOME:  Medicare Risk at Home - 06/14/23 1116     Any stairs in or around the home? Yes   yes outside   If so, are there any without handrails? Yes    Home free of loose throw rugs in walkways, pet beds, electrical cords, etc? Yes    Adequate lighting in your home to reduce risk of falls? Yes    Life alert? No  Use of a cane, walker or w/c? No    Grab bars in the bathroom? Yes    Shower chair or bench in shower? No    Elevated toilet seat or a handicapped toilet? Yes             TIMED UP AND GO:  Was the test performed?  No    Cognitive Function:    08/01/2016    2:09 PM  MMSE - Mini Mental State Exam  Orientation to time 5  Orientation to Place 5  Registration 3  Attention/ Calculation 5  Recall 2  Language- name 2 objects 2  Language- repeat 1  Language- follow 3 step command 3  Language- read & follow direction 1  Write a sentence 1  Copy design 1  Total score 29        06/14/2023   11:23 AM 02/28/2019   11:21 AM 01/04/2017    2:27 PM  6CIT Screen  What Year? 0 points 0 points 0 points  What month? 0 points 0 points 0 points  What time? 0 points 0 points 3 points  Count back from 20 0 points 0 points 0 points  Months in  reverse 0 points 0 points 0 points  Repeat phrase 0 points 0 points 0 points  Total Score 0 points 0 points 3 points    Immunizations Immunization History  Administered Date(s) Administered   Fluad Quad(high Dose 65+) 10/21/2019, 10/14/2020, 08/25/2021, 10/05/2022   Influenza Inj Mdck Quad Pf 10/27/2016   Influenza Split 10/05/2011, 10/10/2012   Influenza, High Dose Seasonal PF 09/04/2014, 09/01/2015, 10/05/2017, 10/10/2018   Influenza-Unspecified 10/12/2013, 08/12/2014, 09/04/2014   Moderna Sars-Covid-2 Vaccination 02/06/2020, 03/03/2020, 11/11/2020   PFIZER(Purple Top)SARS-COV-2 Vaccination 02/06/2020, 03/03/2020, 11/11/2020   Pneumococcal Conjugate-13 03/26/2015   Pneumococcal Polysaccharide-23 10/11/1999, 04/10/2012   Td 08/23/1999   Tdap 05/20/2019   Tetanus 05/28/2019   Zoster, Live 09/26/2008    TDAP status: Up to date  Flu Vaccine status: Up to date  Pneumococcal vaccine status: Up to date  Covid-19 vaccine status: Completed vaccines  Qualifies for Shingles Vaccine? Yes   Zostavax completed No   Shingrix Completed?: No.    Education has been provided regarding the importance of this vaccine. Patient has been advised to call insurance company to determine out of pocket expense if they have not yet received this vaccine. Advised may also receive vaccine at local pharmacy or Health Dept. Verbalized acceptance and understanding.  Screening Tests Health Maintenance  Topic Date Due   Zoster Vaccines- Shingrix (1 of 2) Never done   COVID-19 Vaccine (7 - 2023-24 season) 08/12/2022   DEXA SCAN  02/07/2023   Colonoscopy  02/23/2023   INFLUENZA VACCINE  07/13/2023   Medicare Annual Wellness (AWV)  06/13/2024   DTaP/Tdap/Td (4 - Td or Tdap) 05/27/2029   Pneumonia Vaccine 70+ Years old  Completed   Hepatitis C Screening  Completed   HPV VACCINES  Aged Out    Health Maintenance  Health Maintenance Due  Topic Date Due   Zoster Vaccines- Shingrix (1 of 2) Never done    COVID-19 Vaccine (7 - 2023-24 season) 08/12/2022   DEXA SCAN  02/07/2023   Colonoscopy  02/23/2023    Colorectal cancer screening: No longer required.   Mammogram status: No longer required due to age.  Bone Density status: Completed yes. Results reflect: Bone density results: NORMAL. Repeat every 5 years.  Lung Cancer Screening: (Low Dose CT Chest recommended if Age 19-80 years, 20 pack-year  currently smoking OR have quit w/in 15years.) does not qualify.   Lung Cancer Screening Referral: no  Additional Screening:  Hepatitis C Screening: does not qualify; Completed yes  Vision Screening: Recommended annual ophthalmology exams for early detection of glaucoma and other disorders of the eye. Is the patient up to date with their annual eye exam?  Yes  Who is the provider or what is the name of the office in which the patient attends annual eye exams? Dr Melanie Crazier If pt is not established with a provider, would they like to be referred to a provider to establish care? No .   Dental Screening: Recommended annual dental exams for proper oral hygiene  Diabetic Foot Exam: n/a  Community Resource Referral / Chronic Care Management: CRR required this visit?  No   CCM required this visit?  No     Plan:     I have personally reviewed and noted the following in the patient's chart:   Medical and social history Use of alcohol, tobacco or illicit drugs  Current medications and supplements including opioid prescriptions. Patient is not currently taking opioid prescriptions. Functional ability and status Nutritional status Physical activity Advanced directives List of other physicians Hospitalizations, surgeries, and ER visits in previous 12 months Vitals Screenings to include cognitive, depression, and falls Referrals and appointments  In addition, I have reviewed and discussed with patient certain preventive protocols, quality metrics, and best practice recommendations. A written  personalized care plan for preventive services as well as general preventive health recommendations were provided to patient.     Sue Lush, LPN   12/17/1094   After Visit Summary: (MyChart) Due to this being a telephonic visit, the after visit summary with patients personalized plan was offered to patient via MyChart   Nurse Notes: The patient states she is doing well and has no concerns or questions at this time.

## 2023-06-14 NOTE — Patient Instructions (Signed)
Nancy Blackburn , Thank you for taking time to come for your Medicare Wellness Visit. I appreciate your ongoing commitment to your health goals. Please review the following plan we discussed and let me know if I can assist you in the future.   These are the goals we discussed:  Goals       DIET - EAT MORE FRUITS AND VEGETABLES      DIET - INCREASE WATER INTAKE      Recommend increasing water intake to 4-6 glasses a day.       Patient Stated (pt-stated)      Continue going to the gym.      Track and Manage My Blood Pressure-Hypertension      Timeframe:  Long-Range Goal Priority:  High Start Date: 12/28/2021                            Expected End Date: 12/28/2022                       - check blood pressure 3 times per week    Why is this important?   You won't feel high blood pressure, but it can still hurt your blood vessels.  High blood pressure can cause heart or kidney problems. It can also cause a stroke.  Making lifestyle changes like losing a little weight or eating less salt will help.  Checking your blood pressure at home and at different times of the day can help to control blood pressure.  If the doctor prescribes medicine remember to take it the way the doctor ordered.  Call the office if you cannot afford the medicine or if there are questions about it.     Notes:         This is a list of the screening recommended for you and due dates:  Health Maintenance  Topic Date Due   Zoster (Shingles) Vaccine (1 of 2) Never done   COVID-19 Vaccine (7 - 2023-24 season) 08/12/2022   DEXA scan (bone density measurement)  02/07/2023   Colon Cancer Screening  02/23/2023   Flu Shot  07/13/2023   Medicare Annual Wellness Visit  06/13/2024   DTaP/Tdap/Td vaccine (4 - Td or Tdap) 05/27/2029   Pneumonia Vaccine  Completed   Hepatitis C Screening  Completed   HPV Vaccine  Aged Out    Advanced directives: yes  Conditions/risks identified: moderate fall risk  Next  appointment: Follow up in one year for your annual wellness visit 06/18/2024 @ 11:15am telephone   Preventive Care 65 Years and Older, Female Preventive care refers to lifestyle choices and visits with your health care provider that can promote health and wellness. What does preventive care include? A yearly physical exam. This is also called an annual well check. Dental exams once or twice a year. Routine eye exams. Ask your health care provider how often you should have your eyes checked. Personal lifestyle choices, including: Daily care of your teeth and gums. Regular physical activity. Eating a healthy diet. Avoiding tobacco and drug use. Limiting alcohol use. Practicing safe sex. Taking low-dose aspirin every day. Taking vitamin and mineral supplements as recommended by your health care provider. What happens during an annual well check? The services and screenings done by your health care provider during your annual well check will depend on your age, overall health, lifestyle risk factors, and family history of disease. Counseling  Your health care  provider may ask you questions about your: Alcohol use. Tobacco use. Drug use. Emotional well-being. Home and relationship well-being. Sexual activity. Eating habits. History of falls. Memory and ability to understand (cognition). Work and work Astronomer. Reproductive health. Screening  You may have the following tests or measurements: Height, weight, and BMI. Blood pressure. Lipid and cholesterol levels. These may be checked every 5 years, or more frequently if you are over 108 years old. Skin check. Lung cancer screening. You may have this screening every year starting at age 24 if you have a 30-pack-year history of smoking and currently smoke or have quit within the past 15 years. Fecal occult blood test (FOBT) of the stool. You may have this test every year starting at age 68. Flexible sigmoidoscopy or colonoscopy. You  may have a sigmoidoscopy every 5 years or a colonoscopy every 10 years starting at age 54. Hepatitis C blood test. Hepatitis B blood test. Sexually transmitted disease (STD) testing. Diabetes screening. This is done by checking your blood sugar (glucose) after you have not eaten for a while (fasting). You may have this done every 1-3 years. Bone density scan. This is done to screen for osteoporosis. You may have this done starting at age 53. Mammogram. This may be done every 1-2 years. Talk to your health care provider about how often you should have regular mammograms. Talk with your health care provider about your test results, treatment options, and if necessary, the need for more tests. Vaccines  Your health care provider may recommend certain vaccines, such as: Influenza vaccine. This is recommended every year. Tetanus, diphtheria, and acellular pertussis (Tdap, Td) vaccine. You may need a Td booster every 10 years. Zoster vaccine. You may need this after age 73. Pneumococcal 13-valent conjugate (PCV13) vaccine. One dose is recommended after age 30. Pneumococcal polysaccharide (PPSV23) vaccine. One dose is recommended after age 72. Talk to your health care provider about which screenings and vaccines you need and how often you need them. This information is not intended to replace advice given to you by your health care provider. Make sure you discuss any questions you have with your health care provider. Document Released: 12/25/2015 Document Revised: 08/17/2016 Document Reviewed: 09/29/2015 Elsevier Interactive Patient Education  2017 [employer].  Fall Prevention in the Home Falls can cause injuries. They can happen to people of all ages. There are many things you can do to make your home safe and to help prevent falls. What can I do on the outside of my home? Regularly fix the edges of walkways and driveways and fix any cracks. Remove anything that might make you trip as you walk  through a door, such as a raised step or threshold. Trim any bushes or trees on the path to your home. Use bright outdoor lighting. Clear any walking paths of anything that might make someone trip, such as rocks or tools. Regularly check to see if handrails are loose or broken. Make sure that both sides of any steps have handrails. Any raised decks and porches should have guardrails on the edges. Have any leaves, snow, or ice cleared regularly. Use sand or salt on walking paths during winter. Clean up any spills in your garage right away. This includes oil or grease spills. What can I do in the bathroom? Use night lights. Install grab bars by the toilet and in the tub and shower. Do not use towel bars as grab bars. Use non-skid mats or decals in the tub or shower. If  you need to sit down in the shower, use a plastic, non-slip stool. Keep the floor dry. Clean up any water that spills on the floor as soon as it happens. Remove soap buildup in the tub or shower regularly. Attach bath mats securely with double-sided non-slip rug tape. Do not have throw rugs and other things on the floor that can make you trip. What can I do in the bedroom? Use night lights. Make sure that you have a light by your bed that is easy to reach. Do not use any sheets or blankets that are too big for your bed. They should not hang down onto the floor. Have a firm chair that has side arms. You can use this for support while you get dressed. Do not have throw rugs and other things on the floor that can make you trip. What can I do in the kitchen? Clean up any spills right away. Avoid walking on wet floors. Keep items that you use a lot in easy-to-reach places. If you need to reach something above you, use a strong step stool that has a grab bar. Keep electrical cords out of the way. Do not use floor polish or wax that makes floors slippery. If you must use wax, use non-skid floor wax. Do not have throw rugs and  other things on the floor that can make you trip. What can I do with my stairs? Do not leave any items on the stairs. Make sure that there are handrails on both sides of the stairs and use them. Fix handrails that are broken or loose. Make sure that handrails are as long as the stairways. Check any carpeting to make sure that it is firmly attached to the stairs. Fix any carpet that is loose or worn. Avoid having throw rugs at the top or bottom of the stairs. If you do have throw rugs, attach them to the floor with carpet tape. Make sure that you have a light switch at the top of the stairs and the bottom of the stairs. If you do not have them, ask someone to add them for you. What else can I do to help prevent falls? Wear shoes that: Do not have high heels. Have rubber bottoms. Are comfortable and fit you well. Are closed at the toe. Do not wear sandals. If you use a stepladder: Make sure that it is fully opened. Do not climb a closed stepladder. Make sure that both sides of the stepladder are locked into place. Ask someone to hold it for you, if possible. Clearly mark and make sure that you can see: Any grab bars or handrails. First and last steps. Where the edge of each step is. Use tools that help you move around (mobility aids) if they are needed. These include: Canes. Walkers. Scooters. Crutches. Turn on the lights when you go into a dark area. Replace any light bulbs as soon as they burn out. Set up your furniture so you have a clear path. Avoid moving your furniture around. If any of your floors are uneven, fix them. If there are any pets around you, be aware of where they are. Review your medicines with your doctor. Some medicines can make you feel dizzy. This can increase your chance of falling. Ask your doctor what other things that you can do to help prevent falls. This information is not intended to replace advice given to you by your health care provider. Make sure you  discuss any questions you have with  your health care provider. Document Released: 09/24/2009 Document Revised: 05/05/2016 Document Reviewed: 01/02/2015 Elsevier Interactive Patient Education  2017 [employer].

## 2023-06-21 DIAGNOSIS — N3946 Mixed incontinence: Secondary | ICD-10-CM | POA: Diagnosis not present

## 2023-06-21 DIAGNOSIS — R350 Frequency of micturition: Secondary | ICD-10-CM | POA: Diagnosis not present

## 2023-06-22 DIAGNOSIS — F3341 Major depressive disorder, recurrent, in partial remission: Secondary | ICD-10-CM | POA: Diagnosis not present

## 2023-06-22 DIAGNOSIS — G250 Essential tremor: Secondary | ICD-10-CM | POA: Diagnosis not present

## 2023-06-22 DIAGNOSIS — F411 Generalized anxiety disorder: Secondary | ICD-10-CM | POA: Diagnosis not present

## 2023-06-22 DIAGNOSIS — G3184 Mild cognitive impairment, so stated: Secondary | ICD-10-CM | POA: Diagnosis not present

## 2023-06-22 DIAGNOSIS — N179 Acute kidney failure, unspecified: Secondary | ICD-10-CM | POA: Diagnosis not present

## 2023-06-22 DIAGNOSIS — G4733 Obstructive sleep apnea (adult) (pediatric): Secondary | ICD-10-CM | POA: Diagnosis not present

## 2023-06-22 DIAGNOSIS — I272 Pulmonary hypertension, unspecified: Secondary | ICD-10-CM | POA: Diagnosis not present

## 2023-06-27 DIAGNOSIS — R197 Diarrhea, unspecified: Secondary | ICD-10-CM | POA: Diagnosis not present

## 2023-07-09 ENCOUNTER — Ambulatory Visit
Admission: EM | Admit: 2023-07-09 | Discharge: 2023-07-09 | Disposition: A | Discharge status: Home / Self Care | Payer: Medicare Other | Attending: Emergency Medicine | Admitting: Emergency Medicine

## 2023-07-09 DIAGNOSIS — R350 Frequency of micturition: Secondary | ICD-10-CM | POA: Diagnosis not present

## 2023-07-09 DIAGNOSIS — W19XXXA Unspecified fall, initial encounter: Secondary | ICD-10-CM | POA: Diagnosis not present

## 2023-07-09 DIAGNOSIS — R410 Disorientation, unspecified: Secondary | ICD-10-CM | POA: Diagnosis not present

## 2023-07-09 LAB — POCT URINALYSIS DIP (MANUAL ENTRY)
Glucose, UA: NEGATIVE mg/dL
Nitrite, UA: NEGATIVE
Protein Ur, POC: 30 mg/dL — AB
Spec Grav, UA: >=1.03 — AB (ref 1.010–1.025)
Urobilinogen, UA: 0.2 E.U./dL
pH, UA: 5.5 (ref 5.0–8.0)

## 2023-07-09 MED ORDER — CEPHALEXIN 500 MG PO CAPS: 500.0000 mg | ORAL_CAPSULE | Freq: Two times a day (BID) | ORAL | 0 refills | Status: AC

## 2023-07-09 NOTE — ED Provider Notes (Signed)
[facility]    CSN: [id number] Arrival date & time: 07/09/23  8295      History   Chief Complaint Chief Complaint  Patient presents with   Back Pain    HPI Nancy Blackburn is a 77 y.o. female.  Accompanied by her grandson's girlfriend, patient presents with urinary frequency and low back pain x 1 day.  She had confusion yesterday evening and then fell.  She is confident that she did not hit her head or lose consciousness.  She states she had similar episode in the recent past due to a UTI.  She denies fever, chills, abdominal pain, flank pain, or other symptoms.  No focal weakness, chest pain, shortness of breath.  No treatments at home.  Patient was seen at [facility] ED on 05/06/2023 for similar symptoms and had extensive workup; diagnosed with acute cystitis without hematuria; treated with cefdinir; culture >100,000 colonies of E. coli.  The history is provided by the patient, a caregiver and medical records.    Past Medical History:  Diagnosis Date   Adnexal cyst 10/18/2021   a.) CT abdomen: large cystic mass in the LEFT hemipelvis measuring 8.5 cm. b.) Pelvis US 10/22/2021: adnexal cyst measuring 8.0 x 8.2 x 7.9 (volume 270 cm). c.) Pelvis US 01/17/2022: cystic mass measured 8.2 x 8.0 x 8.3 (volume 290 cm).   Anxiety    a.) on BZO (lorazepam) therapy PRN   Arthritis    Asthma    B12 deficiency    Benign essential tremor    Bleeding gastric ulcer    Bradycardia    Cervical cancer (HCC) 1978   Chronic sinus infection    Coronary artery disease    a.) CT chest 04/02/2021: coronary calcifications. b.) Lexi 01/21/2022: calcifications near [facility].   Depression    Diastolic dysfunction    a.) TTE 05/24/2012: EF 55-65%; triv MR, mild PR; G1DD. b.) TTE 05/24/2012: EF 55-65%; mild LVH; triv MR, mild TR/PR; G1DD. c.) TTE 04/15/2015: EF 60-65%; mild LVH; mild LA dilation; mild PR, triv TR; G2DD. d.) TTE 07/03/20 and 09/28/2021: EF 60-65%; mild LVH; G1DD.    Fracture, sacrum/coccyx (HCC)    GERD (gastroesophageal reflux disease)    Hiatal hernia    History of kidney stones    Hyperlipidemia    Hypertension    Insomnia    a.) takes trazodone PRN   Low back pain    Mild dementia (HCC)    Nasal polyps    Normal nuclear stress test    a. 07/2005 Nl nuc stress test.   Osteopenia    Osteoporosis    Palpitations    a. June 2013:  Event recorder normal sinus rhythm with rare PVCs - ? symptomatic PVC's.   Parkinsonian features    a.) resting tremors; LEFT hemibody Parkinsonism. b.) takes primidone   Pericardial effusion    a.) TTE 05/24/2012: small. b.) TTE 04/15/2015: trivial. c.) TTE 09/28/2021: small.   Pneumonia    PONV (postoperative nausea and vomiting)    Thyroid nodule    Vitamin D deficiency     Patient Active Problem List   Diagnosis Date Noted   Right ovarian cyst 03/02/2022   Cyst of right ovary    Cyst of left ovary    Acute pain of right knee    Elevated LFTs    Acute metabolic encephalopathy    Sepsis due to Proteus species (HCC) 10/18/2021   Rhabdomyolysis 10/18/2021   Acute kidney injury (HCC) 10/18/2021  Leukocytosis 10/18/2021   Asthma 07/02/2021   S/P TKR (total knee replacement) using cement, right 03/09/2021   Bradycardia 07/03/2020   Postural dizziness with presyncope 07/01/2020   Family history of colon cancer in mother 02/22/2018   Cervical spondylosis 05/04/2017   Cervical radiculopathy 05/04/2017   Family history of Cushing disease 01/12/2017   B12 deficiency 12/18/2016   Mild cognitive impairment with memory loss 12/18/2016   Parkinsonian features 12/18/2016   Vitamin D deficiency 12/18/2016   Chalazion, bilateral 06/08/2016   Absolute anemia 07/30/2015   Edema extremities 07/30/2015   Benign essential tremor 07/30/2015   Anxiety, generalized 07/30/2015   Gastro-esophageal reflux disease without esophagitis 07/30/2015   HLD (hyperlipidemia) 07/30/2015   Cannot sleep 07/30/2015   Lumbar  radiculopathy 07/30/2015   Depression, major, recurrent, in partial remission (HCC) 07/30/2015   Arthritis, degenerative 07/30/2015   Allergic rhinitis 07/30/2015   Gastroduodenal ulcer 07/30/2015   Calcium blood increased 07/30/2015   Pre-syncope 05/17/2015   Dyspnea 03/31/2015   Bleeding stomach ulcer 10/14/2013   Anemia, iron deficiency 10/14/2013   Weakness 10/14/2013   Ejection fraction    Pericardial effusion    Palpitations    Low back pain    Normal nuclear stress test     Past Surgical History:  Procedure Laterality Date   BACK SURGERY  08/2010   BLADDER SUSPENSION  2003   bone spur  2009   removed from right collar bone area and partial collar bone removed   CATARACT EXTRACTION W/ INTRAOCULAR LENS  IMPLANT, BILATERAL     EYE SURGERY     LUMBAR FUSION  2011, 2012   ROBOTIC ASSISTED SALPINGO OOPHERECTOMY Bilateral 03/02/2022   Procedure: XI ROBOTIC ASSISTED SALPINGO OOPHORECTOMY;  Surgeon: Natale Milch, MD;  Location: [facility] ORS;  Service: Gynecology;  Laterality: Bilateral;   SHOULDER ARTHROSCOPY Right 2012   THYROIDECTOMY, PARTIAL  1976   TONSILLECTOMY     as child   TOTAL KNEE ARTHROPLASTY Right 03/09/2021   Procedure: Right TOTAL KNEE ARTHROPLASTY;  Surgeon: Juanell Fairly, MD;  Location: [facility] ORS;  Service: Orthopedics;  Laterality: Right;   TUBAL LIGATION  1977   VAGINAL HYSTERECTOMY  1978   d/t cervical cancer    OB History     Gravida  3   Para  2   Term  2   Preterm      AB  1   Living  2      SAB      IAB      Ectopic      Multiple      Live Births  2            Home Medications    Prior to Admission medications   Medication Sig Start Date End Date Taking? Authorizing Provider  cephALEXin (KEFLEX) 500 MG capsule Take 1 capsule (500 mg total) by mouth 2 (two) times daily for 5 days. 07/09/23 07/14/23 Yes Mickie Bail, NP  colestipol (COLESTID) 1 g tablet Take by mouth. 06/27/23  Yes [provider]  sertraline  (ZOLOFT) 25 MG tablet Take 25 mg by mouth daily. 03/10/23  Yes [provider]  traZODone (DESYREL) 100 MG tablet Take 200 mg by mouth at bedtime as needed. 01/26/23  Yes [provider]  acetaminophen (TYLENOL) 500 MG tablet Take 2 tablets (1,000 mg total) by mouth every 6 (six) hours. 03/03/22   Schuman, Jaquelyn Bitter, MD  albuterol (VENTOLIN HFA) 108 (90 Base) MCG/ACT inhaler Inhale 1-2 puffs  into the lungs every 6 (six) hours as needed for wheezing or shortness of breath. 07/02/21   Parrett, Virgel Bouquet, NP  amLODipine (NORVASC) 5 MG tablet TAKE 1 TABLET EVERY DAY 08/08/22   Bosie Clos, MD  Budeson-Glycopyrrol-Formoterol (BREZTRI AEROSPHERE) 160-9-4.8 MCG/ACT AERO Inhale 2 puffs into the lungs in the morning and at bedtime. 05/18/23   Parrett, Virgel Bouquet, NP  Calcium Carb-Cholecalciferol (CALCIUM 600+D3) 600-800 MG-UNIT TABS Take 1 tablet by mouth daily.    [provider]  cholecalciferol (VITAMIN D) 25 MCG tablet Take 1 tablet (1,000 Units total) by mouth daily. 10/26/21   Alford Highland, MD  citalopram (CELEXA) 40 MG tablet Take 40 mg by mouth daily. 08/06/15   [provider]  fexofenadine (ALLEGRA) 180 MG tablet Take 180 mg by mouth at bedtime.    [provider]  ipratropium-albuterol (DUONEB) 0.5-2.5 (3) MG/3ML SOLN Take 3 mLs by nebulization every 6 (six) hours as needed. 02/14/23   Drubel, Lillia Abed, PA-C  LORazepam (ATIVAN) 0.5 MG tablet TAKE ONE TABLET (0.5 MG TOTAL) BY MOUTH DAILY AS NEEDED FOR ANXIETY. 04/26/23   Alfredia Ferguson, PA-C  Melatonin 5 MG CHEW Chew 10 mg by mouth at bedtime. Patient not taking: Reported on 05/24/2023    [provider]  Menthol, Topical Analgesic, (BIOFREEZE EX) Apply 1 application. topically daily as needed (pain).    [provider]  mirtazapine (REMERON) 30 MG tablet Take 30 mg by mouth at bedtime. 12/15/22   [provider]  Nebulizer MISC Use with nebulized medication q 6 hours 02/14/23    Drubel, Lillia Abed, PA-C  nitrofurantoin (MACRODANTIN) 100 MG capsule Take 100 mg by mouth 2 (two) times daily. Patient not taking: Reported on 05/24/2023 04/07/23   [provider]  predniSONE (DELTASONE) 20 MG tablet Take 1 tablet (20 mg total) by mouth daily with breakfast. Patient not taking: Reported on 05/24/2023 04/12/23   Drubel, Lillia Abed, PA-C  primidone (MYSOLINE) 50 MG tablet Take 50 mg by mouth at bedtime.    [provider]  QUEtiapine (SEROQUEL) 25 MG tablet Take 50 mg by mouth at bedtime.    [provider]  Respiratory Therapy Supplies (NEBULIZER MASK ADULT) MISC Use with nebulized medication q 6 hours 02/14/23   Drubel, Lillia Abed, PA-C  rosuvastatin (CRESTOR) 40 MG tablet Take 1 tablet (40 mg total) by mouth at bedtime. 09/28/22   Debbe Odea, MD  vitamin B-12 (CYANOCOBALAMIN) 1000 MCG tablet Take 1,000 mcg by mouth daily.    [provider]    Family History Family History  Problem Relation Age of Onset   Colon cancer Mother    Heart murmur Mother    Heart disease Mother    Angina Mother    Heart disease Father    COPD Father    Atrial fibrillation Brother    Cushing syndrome Daughter    Cancer Daughter        thyroid   Breast cancer Cousin    Hypotension Neg Hx    Malignant hyperthermia Neg Hx    Pseudochol deficiency Neg Hx     Social History Social History   Tobacco Use   Smoking status: Never   Smokeless tobacco: Never  Vaping Use   Vaping status: Never Used  Substance Use Topics   Alcohol use: Yes    Comment: 1 glass of wine or mixed drink every 2-3 months   Drug use: No     Allergies   Anti-inflammatory enzyme [nutritional supplements], Cymbalta [duloxetine hcl], Clarithromycin,  Clarithromycin, Donepezil hcl, Nsaids, Ranitidine, and Venlafaxine   Review of Systems Review of Systems  Constitutional:  Negative for chills and fever.  Respiratory:  Negative for cough and shortness of breath.   Cardiovascular:   Negative for chest pain and palpitations.  Gastrointestinal:  Negative for abdominal pain, nausea and vomiting.  Genitourinary:  Positive for frequency. Negative for dysuria, flank pain, hematuria and pelvic pain.  Musculoskeletal:  Positive for back pain. Negative for arthralgias.  Neurological:  Negative for dizziness, syncope, facial asymmetry, speech difficulty, weakness and numbness.  Psychiatric/Behavioral:  Positive for confusion.   All other systems reviewed and are negative.    Physical Exam Triage Vital Signs ED Triage Vitals  Encounter Vitals Group     BP      Systolic BP Percentile      Diastolic BP Percentile      Pulse      Resp      Temp      Temp src      SpO2      Weight      Height      Head Circumference      Peak Flow      Pain Score      Pain Loc      Pain Education      Exclude from Growth Chart    No data found.  Updated Vital Signs BP 112/71   Pulse 85   Temp 98.1 F (36.7 C)   Resp 18   SpO2 97%   Visual Acuity Right Eye Distance:   Left Eye Distance:   Bilateral Distance:    Right Eye Near:   Left Eye Near:    Bilateral Near:     Physical Exam Vitals and nursing note reviewed.  Constitutional:      General: She is not in acute distress.    Appearance: She is well-developed.  HENT:     Head: Normocephalic and atraumatic.     Mouth/Throat:     Mouth: Mucous membranes are moist.  Eyes:     Pupils: Pupils are equal, round, and reactive to light.  Cardiovascular:     Rate and Rhythm: Normal rate and regular rhythm.     Heart sounds: Normal heart sounds.  Pulmonary:     Effort: Pulmonary effort is normal. No respiratory distress.     Breath sounds: Normal breath sounds.  Abdominal:     General: Bowel sounds are normal.     Palpations: Abdomen is soft.     Tenderness: There is no abdominal tenderness. There is no right CVA tenderness, left CVA tenderness, guarding or rebound.  Musculoskeletal:     Cervical back: Neck supple.   Skin:    General: Skin is warm and dry.  Neurological:     General: No focal deficit present.     Mental Status: She is alert and oriented to person, place, and time.     Sensory: No sensory deficit.     Motor: No weakness.     Gait: Gait normal.  Psychiatric:        Mood and Affect: Mood normal.        Behavior: Behavior normal.      UC Treatments / Results  Labs (all labs ordered are listed, but only abnormal results are displayed) Labs Reviewed  POCT URINALYSIS DIP (MANUAL ENTRY) - Abnormal; Notable for the following components:      Result Value   Bilirubin, UA small (*)  Ketones, POC UA small (15) (*)    Spec Grav, UA >=1.030 (*)    Blood, UA moderate (*)    Protein Ur, POC =30 (*)    Leukocytes, UA Small (1+) (*)    All other components within normal limits  URINE CULTURE    EKG   Radiology No results found.  Procedures Procedures (including critical care time)  Medications Ordered in UC Medications - No data to display  Initial Impression / Assessment and Plan / UC Course  I have reviewed the triage vital signs and the nursing notes.  Pertinent labs & imaging results that were available during my care of the patient were reviewed by me and considered in my medical decision making (see chart for details).   Urinary frequency, confusion, fall at home yesterday.  Culture pending.  Treating with cephalexin.  Patient is alert, oriented, well-appearing at this time.  She reports no head injury or LOC with fall last evening.  She declines transfer to the ED.  Discussed ED precautions with patient and her grandson's girlfriend; They are also able to reach her daughter by phone to discuss ED precautions.  Patient provided on urinary frequency, confusion, fall prevention.  Patient and family agreed to plan of care.   Final Clinical Impressions(s) / UC Diagnoses   Final diagnoses:  Urinary frequency  Confusion  Fall, initial encounter     Discharge  Instructions      Go to the emergency department for evaluation of your confusion and a fall.    Take the antibiotic as directed.  The urine culture is pending.  We will call you if it shows the need to change or discontinue your antibiotic.    Follow up with your primary care provider tomorrow.        ED Prescriptions     Medication Sig Dispense Auth. Provider   cephALEXin (KEFLEX) 500 MG capsule Take 1 capsule (500 mg total) by mouth 2 (two) times daily for 5 days. 10 capsule Mickie Bail, NP      PDMP not reviewed this encounter.   Mickie Bail, NP 07/09/23 [phone]

## 2023-07-09 NOTE — ED Triage Notes (Signed)
Patient to Urgent Care with concerns of a urinary tract infection. States she is having some lower back pain and urinary frequency that started yesterday.  Reports she had a mechanical fall last night when getting into bed. Denies any injury. NO LOC. States that when she has fallen previously she was found to have a UTI. Patient reports her daughter told her she also was "talking out of my head" last night.

## 2023-07-09 NOTE — Discharge Instructions (Addendum)
Go to the emergency department for evaluation of your confusion and a fall.    Take the antibiotic as directed.  The urine culture is pending.  We will call you if it shows the need to change or discontinue your antibiotic.    Follow up with your primary care provider tomorrow.

## 2023-07-10 DIAGNOSIS — R29818 Other symptoms and signs involving the nervous system: Secondary | ICD-10-CM | POA: Diagnosis not present

## 2023-07-10 DIAGNOSIS — G250 Essential tremor: Secondary | ICD-10-CM | POA: Diagnosis not present

## 2023-07-10 DIAGNOSIS — G3184 Mild cognitive impairment, so stated: Secondary | ICD-10-CM | POA: Diagnosis not present

## 2023-07-12 DIAGNOSIS — N3000 Acute cystitis without hematuria: Secondary | ICD-10-CM | POA: Diagnosis not present

## 2023-07-24 DIAGNOSIS — F331 Major depressive disorder, recurrent, moderate: Secondary | ICD-10-CM | POA: Diagnosis not present

## 2023-07-24 DIAGNOSIS — F5105 Insomnia due to other mental disorder: Secondary | ICD-10-CM | POA: Diagnosis not present

## 2023-07-24 DIAGNOSIS — F411 Generalized anxiety disorder: Secondary | ICD-10-CM | POA: Diagnosis not present

## 2023-08-06 DIAGNOSIS — F331 Major depressive disorder, recurrent, moderate: Secondary | ICD-10-CM | POA: Diagnosis not present

## 2023-08-06 DIAGNOSIS — F411 Generalized anxiety disorder: Secondary | ICD-10-CM | POA: Diagnosis not present

## 2023-08-06 DIAGNOSIS — F5105 Insomnia due to other mental disorder: Secondary | ICD-10-CM | POA: Diagnosis not present

## 2023-08-07 DIAGNOSIS — R197 Diarrhea, unspecified: Secondary | ICD-10-CM | POA: Diagnosis not present

## 2023-08-07 DIAGNOSIS — Z800 Family history of malignant neoplasm of digestive organs: Secondary | ICD-10-CM | POA: Diagnosis not present

## 2023-09-13 DIAGNOSIS — F411 Generalized anxiety disorder: Secondary | ICD-10-CM | POA: Diagnosis not present

## 2023-09-13 DIAGNOSIS — I272 Pulmonary hypertension, unspecified: Secondary | ICD-10-CM | POA: Diagnosis not present

## 2023-09-13 DIAGNOSIS — G3184 Mild cognitive impairment, so stated: Secondary | ICD-10-CM | POA: Diagnosis not present

## 2023-09-13 DIAGNOSIS — J188 Other pneumonia, unspecified organism: Secondary | ICD-10-CM | POA: Diagnosis not present

## 2023-09-13 DIAGNOSIS — F3341 Major depressive disorder, recurrent, in partial remission: Secondary | ICD-10-CM | POA: Diagnosis not present

## 2023-09-13 DIAGNOSIS — I700 Atherosclerosis of aorta: Secondary | ICD-10-CM | POA: Diagnosis not present

## 2023-09-27 DIAGNOSIS — F331 Major depressive disorder, recurrent, moderate: Secondary | ICD-10-CM | POA: Diagnosis not present

## 2023-09-27 DIAGNOSIS — F5105 Insomnia due to other mental disorder: Secondary | ICD-10-CM | POA: Diagnosis not present

## 2023-09-27 DIAGNOSIS — G3183 Dementia with Lewy bodies: Secondary | ICD-10-CM | POA: Diagnosis not present

## 2023-09-27 DIAGNOSIS — F411 Generalized anxiety disorder: Secondary | ICD-10-CM | POA: Diagnosis not present

## 2023-11-01 DIAGNOSIS — R29818 Other symptoms and signs involving the nervous system: Secondary | ICD-10-CM | POA: Diagnosis not present

## 2023-11-01 DIAGNOSIS — E538 Deficiency of other specified B group vitamins: Secondary | ICD-10-CM | POA: Diagnosis not present

## 2023-11-01 DIAGNOSIS — G250 Essential tremor: Secondary | ICD-10-CM | POA: Diagnosis not present

## 2023-11-01 DIAGNOSIS — G3184 Mild cognitive impairment, so stated: Secondary | ICD-10-CM | POA: Diagnosis not present

## 2023-11-07 ENCOUNTER — Other Ambulatory Visit: Payer: Self-pay

## 2023-11-07 DIAGNOSIS — E785 Hyperlipidemia, unspecified: Secondary | ICD-10-CM

## 2023-11-07 DIAGNOSIS — Z23 Encounter for immunization: Secondary | ICD-10-CM | POA: Diagnosis not present

## 2023-11-07 MED ORDER — ROSUVASTATIN CALCIUM 40 MG PO TABS
40.0000 mg | ORAL_TABLET | Freq: Every day | ORAL | 1 refills | Status: DC
Start: 2023-11-07 — End: 2024-05-01

## 2023-11-07 NOTE — Telephone Encounter (Signed)
Requested Prescriptions   Signed Prescriptions Disp Refills   rosuvastatin (CRESTOR) 40 MG tablet 90 tablet 1    Sig: Take 1 tablet (40 mg total) by mouth at bedtime.    Authorizing Provider: Debbe Odea    Ordering User: Guerry Minors   Last visit 05/24/23 with plan to f/u in  12 months.    Next visit: none/active recall

## 2023-11-15 DIAGNOSIS — N3946 Mixed incontinence: Secondary | ICD-10-CM | POA: Diagnosis not present

## 2023-11-15 DIAGNOSIS — R350 Frequency of micturition: Secondary | ICD-10-CM | POA: Diagnosis not present

## 2023-12-01 DIAGNOSIS — K279 Peptic ulcer, site unspecified, unspecified as acute or chronic, without hemorrhage or perforation: Secondary | ICD-10-CM | POA: Diagnosis not present

## 2023-12-01 DIAGNOSIS — D508 Other iron deficiency anemias: Secondary | ICD-10-CM | POA: Diagnosis not present

## 2023-12-01 DIAGNOSIS — K921 Melena: Secondary | ICD-10-CM | POA: Diagnosis not present

## 2023-12-01 DIAGNOSIS — E041 Nontoxic single thyroid nodule: Secondary | ICD-10-CM | POA: Diagnosis not present

## 2023-12-01 DIAGNOSIS — F3341 Major depressive disorder, recurrent, in partial remission: Secondary | ICD-10-CM | POA: Diagnosis not present

## 2023-12-01 DIAGNOSIS — I700 Atherosclerosis of aorta: Secondary | ICD-10-CM | POA: Diagnosis not present

## 2023-12-01 DIAGNOSIS — D509 Iron deficiency anemia, unspecified: Secondary | ICD-10-CM | POA: Diagnosis not present

## 2024-02-23 ENCOUNTER — Ambulatory Visit: Admitting: Pulmonary Disease

## 2024-03-28 ENCOUNTER — Other Ambulatory Visit: Payer: Self-pay | Admitting: Family Medicine

## 2024-03-28 DIAGNOSIS — Z1231 Encounter for screening mammogram for malignant neoplasm of breast: Secondary | ICD-10-CM

## 2024-05-01 ENCOUNTER — Other Ambulatory Visit: Payer: Self-pay

## 2024-05-01 DIAGNOSIS — E785 Hyperlipidemia, unspecified: Secondary | ICD-10-CM

## 2024-05-01 MED ORDER — ROSUVASTATIN CALCIUM 40 MG PO TABS: 40.0000 mg | ORAL_TABLET | Freq: Every day | ORAL | 3 refills | Status: AC

## 2024-05-31 ENCOUNTER — Encounter: Payer: Self-pay | Admitting: Gastroenterology

## 2024-06-20 ENCOUNTER — Ambulatory Visit: Admitting: Anesthesiology

## 2024-06-20 ENCOUNTER — Encounter
Admission: RE | Disposition: A | Discharge status: Home / Self Care | Payer: Self-pay | Source: Ambulatory Visit | Attending: Gastroenterology

## 2024-06-20 ENCOUNTER — Other Ambulatory Visit: Payer: Self-pay

## 2024-06-20 ENCOUNTER — Encounter: Payer: Self-pay | Admitting: Gastroenterology

## 2024-06-20 ENCOUNTER — Ambulatory Visit
Admission: RE | Admit: 2024-06-20 | Discharge: 2024-06-20 | Disposition: A | Discharge status: Home / Self Care | Source: Ambulatory Visit | Attending: Gastroenterology | Admitting: Gastroenterology

## 2024-06-20 DIAGNOSIS — I2510 Atherosclerotic heart disease of native coronary artery without angina pectoris: Secondary | ICD-10-CM | POA: Insufficient documentation

## 2024-06-20 DIAGNOSIS — D120 Benign neoplasm of cecum: Secondary | ICD-10-CM | POA: Insufficient documentation

## 2024-06-20 DIAGNOSIS — J45909 Unspecified asthma, uncomplicated: Secondary | ICD-10-CM | POA: Insufficient documentation

## 2024-06-20 DIAGNOSIS — I10 Essential (primary) hypertension: Secondary | ICD-10-CM | POA: Insufficient documentation

## 2024-06-20 DIAGNOSIS — Z1211 Encounter for screening for malignant neoplasm of colon: Secondary | ICD-10-CM | POA: Diagnosis present

## 2024-06-20 DIAGNOSIS — D123 Benign neoplasm of transverse colon: Secondary | ICD-10-CM | POA: Insufficient documentation

## 2024-06-20 DIAGNOSIS — K5730 Diverticulosis of large intestine without perforation or abscess without bleeding: Secondary | ICD-10-CM | POA: Insufficient documentation

## 2024-06-20 DIAGNOSIS — Z800 Family history of malignant neoplasm of digestive organs: Secondary | ICD-10-CM | POA: Diagnosis not present

## 2024-06-20 DIAGNOSIS — D124 Benign neoplasm of descending colon: Secondary | ICD-10-CM | POA: Insufficient documentation

## 2024-06-20 DIAGNOSIS — K449 Diaphragmatic hernia without obstruction or gangrene: Secondary | ICD-10-CM | POA: Insufficient documentation

## 2024-06-20 DIAGNOSIS — K219 Gastro-esophageal reflux disease without esophagitis: Secondary | ICD-10-CM | POA: Insufficient documentation

## 2024-06-20 HISTORY — PX: COLONOSCOPY: SHX5424

## 2024-06-20 HISTORY — PX: POLYPECTOMY: SHX149

## 2024-06-20 SURGERY — COLONOSCOPY
Anesthesia: General

## 2024-06-20 MED ORDER — PROPOFOL 500 MG/50ML IV EMUL
INTRAVENOUS | Status: DC | PRN
  Administered 2024-06-20: 75 ug/kg/min via INTRAVENOUS

## 2024-06-20 MED ORDER — SODIUM CHLORIDE 0.9 % IV SOLN: INTRAVENOUS | Status: DC

## 2024-06-20 MED ORDER — LIDOCAINE HCL (PF) 2 % IJ SOLN
INTRAMUSCULAR | Status: AC
  Filled 2024-06-20: qty 5

## 2024-06-20 MED ORDER — PROPOFOL 10 MG/ML IV BOLUS
INTRAVENOUS | Status: DC | PRN
  Administered 2024-06-20: 30 mg via INTRAVENOUS
  Administered 2024-06-20: 20 mg via INTRAVENOUS

## 2024-06-20 MED ORDER — PHENYLEPHRINE 80 MCG/ML (10ML) SYRINGE FOR IV PUSH (FOR BLOOD PRESSURE SUPPORT)
PREFILLED_SYRINGE | INTRAVENOUS | Status: AC
Start: 2024-06-20 — End: 2024-06-20
  Filled 2024-06-20: qty 10

## 2024-06-20 MED ORDER — LIDOCAINE HCL (CARDIAC) PF 100 MG/5ML IV SOSY
PREFILLED_SYRINGE | INTRAVENOUS | Status: DC | PRN
  Administered 2024-06-20: 60 mg via INTRAVENOUS

## 2024-06-20 NOTE — Anesthesia Postprocedure Evaluation (Signed)
 Anesthesia Post Note  [name]  Procedure(s) Performed: COLONOSCOPY POLYPECTOMY, INTESTINE  Patient location during evaluation: Endoscopy Anesthesia Type: General Level of consciousness: awake and alert Pain management: pain level controlled Vital Signs Assessment: post-procedure vital signs reviewed and stable Respiratory status: spontaneous breathing, nonlabored ventilation and respiratory function stable Cardiovascular status: blood pressure returned to baseline and stable Postop Assessment: no apparent nausea or vomiting Anesthetic complications: no   No notable events documented.   Last Vitals:  Vitals:   06/20/24 1100 06/20/24 1105  BP: (!) 138/56 (!) 148/74  Pulse: 85 76  Resp: (!) 21 (!) 23  Temp:  (!) 35.7 C  SpO2: 97% 98%    Last Pain:  Vitals:   06/20/24 1105  TempSrc:   PainSc: 0-No pain                 Fairy POUR Uliana Brinker

## 2024-06-20 NOTE — Op Note (Signed)
 [facility] Gastroenterology Patient Name: Natalia Wittmeyer Procedure Date: 06/20/2024 10:02 AM MRN: [id number] Account #: [id number] Date of Birth: 04-21-1946 Admit Type: Outpatient Age: 78 Room: [facility] ENDO ROOM 1 Gender: Female Note Status: Finalized Instrument Name: [id number] Procedure:             Colonoscopy Indications:           Screening in patient at increased risk: Family history                         of 1st-degree relative with colorectal cancer Providers:             Elspeth Ozell Onita ROSALEA, DO Referring MD:          Charlie CROME. Bertrum, MD (Referring MD) Medicines:             Monitored Anesthesia Care Complications:         No immediate complications. Estimated blood loss:                         Minimal. Procedure:             Pre-Anesthesia Assessment:                        - Prior to the procedure, a History and Physical was                         performed, and patient medications and allergies were                         reviewed. The patient is competent. The risks and                         benefits of the procedure and the sedation options and                         risks were discussed with the patient. All questions                         were answered and informed consent was obtained.                         Patient identification and proposed procedure were                         verified by the physician, the nurse, the anesthetist                         and the technician in the endoscopy suite. Mental                         Status Examination: alert and oriented. Airway                         Examination: normal oropharyngeal airway and neck                         mobility. Respiratory Examination: clear to  auscultation. CV Examination: RRR, no murmurs, no S3                         or S4. Prophylactic Antibiotics: The patient does not                         require prophylactic  antibiotics. Prior                         Anticoagulants: The patient has taken no anticoagulant                         or antiplatelet agents. ASA Grade Assessment: III - A                         patient with severe systemic disease. After reviewing                         the risks and benefits, the patient was deemed in                         satisfactory condition to undergo the procedure. The                         anesthesia plan was to use monitored anesthesia care                         (MAC). Immediately prior to administration of                         medications, the patient was re-assessed for adequacy                         to receive sedatives. The heart rate, respiratory                         rate, oxygen  saturations, blood pressure, adequacy of                         pulmonary ventilation, and response to care were                         monitored throughout the procedure. The physical                         status of the patient was re-assessed after the                         procedure.                        After obtaining informed consent, the colonoscope was                         passed under direct vision. Throughout the procedure,                         the patient's blood pressure, pulse, and oxygen   saturations were monitored continuously. The                         Colonoscope was introduced through the anus and                         advanced to the the terminal ileum, with                         identification of the appendiceal orifice and IC                         valve. The colonoscopy was performed without                         difficulty. The patient tolerated the procedure well.                         The quality of the bowel preparation was evaluated                         using the BBPS [facility] Bowel Preparation Scale) with                         scores of: Right Colon = 3, Transverse Colon = 3 and                          Left Colon = 3 (entire mucosa seen well with no                         residual staining, small fragments of stool or opaque                         liquid). The total BBPS score equals 9. The terminal                         ileum, ileocecal valve, appendiceal orifice, and                         rectum were photographed. Findings:      The perianal and digital rectal examinations were normal. Pertinent       negatives include normal sphincter tone.      The terminal ileum appeared normal. Estimated blood loss: none.      Retroflexion in the right colon was performed.      Multiple small-mouthed diverticula were found in the sigmoid colon.       Estimated blood loss: none.      Four sessile polyps were found in the descending colon, transverse colon       (2) and cecum. The polyps were 1 to 2 mm in size. These polyps were       removed with a jumbo cold forceps. Resection and retrieval were       complete. Estimated blood loss was minimal.      A 3 to 4 mm polyp was found in the transverse colon. The polyp was       sessile. The polyp was removed with a cold snare. Resection and       retrieval  were complete. Estimated blood loss was minimal.      Normal mucosa was found in the entire colon. Biopsies for histology were       taken with a cold forceps from the right colon and left colon for       evaluation of microscopic colitis. Estimated blood loss was minimal.      The exam was otherwise without abnormality on direct and retroflexion       views. Impression:            - The examined portion of the ileum was normal.                        - Diverticulosis in the sigmoid colon.                        - Four 1 to 2 mm polyps in the descending colon, in                         the transverse colon and in the cecum, removed with a                         jumbo cold forceps. Resected and retrieved.                        - One 3 to 4 mm polyp in the transverse colon,  removed                         with a cold snare. Resected and retrieved.                        - Normal mucosa in the entire examined colon. Biopsied.                        - The examination was otherwise normal on direct and                         retroflexion views. Recommendation:        - Patient has a contact number available for                         emergencies. The signs and symptoms of potential                         delayed complications were discussed with the patient.                         Return to normal activities tomorrow. Written                         discharge instructions were provided to the patient.                        - Resume previous diet.                        - Continue present medications.                        -  Await pathology results.                        - Repeat colonoscopy for surveillance based on                         pathology results.                        - Return to referring physician as previously                         scheduled.                        - No ibuprofen, naproxen, or other non-steroidal                         anti-inflammatory drugs for 5 days after polyp removal.                        - The findings and recommendations were discussed with                         the patient. Procedure Code(s):     --- Professional ---                        [phone], Colonoscopy, flexible; with removal of                         tumor(s), polyp(s), or other lesion(s) by snare                         technique                        45380, 59, Colonoscopy, flexible; with biopsy, single                         or multiple Diagnosis Code(s):     --- Professional ---                        Z80.0, Family history of malignant neoplasm of                         digestive organs                        D12.4, Benign neoplasm of descending colon                        D12.3, Benign neoplasm of transverse colon (hepatic                          flexure or splenic flexure)                        D12.0, Benign neoplasm of cecum                        K57.30, Diverticulosis of large intestine without  perforation or abscess without bleeding CPT copyright 2022 American Medical Association. All rights reserved. The codes documented in this report are preliminary and upon coder review may  be revised to meet current compliance requirements. Attending Participation:      I personally performed the entire procedure. Elspeth Jungling, DO Elspeth Ozell Jungling DO, DO 06/20/2024 10:52:53 AM This report has been signed electronically. Number of Addenda: 0 Note Initiated On: 06/20/2024 10:02 AM Scope Withdrawal Time: 0 hours 18 minutes 3 seconds  Total Procedure Duration: 0 hours 22 minutes 31 seconds  Estimated Blood Loss:  Estimated blood loss was minimal.      [facility]

## 2024-06-20 NOTE — H&P (Addendum)
 Pre-Procedure H&P   Patient ID: Nancy Blackburn is a 78 y.o. female.  Gastroenterology Provider: Elspeth Ozell Jungling, DO  Referring Provider: Romero Antigua, PA PCP: Bertrum Charlie CROME, MD  Date: 06/20/2024  HPI [name] is a 78 y.o. female who presents today for Colonoscopy for Screening-family history of colon cancer .  Family history of colon cancer-mother (70s)  Has diarrhea that has previously improved, but has now recurred.  No melena or hematochezia.  Last colonoscopy in 2019 with sigmoid diverticulosis without other abnormality  Hemoglobin 14.1 MCV 93 platelets 215,000   Past Medical History:  Diagnosis Date   Adnexal cyst 10/18/2021   a.) CT abdomen: large cystic mass in the LEFT hemipelvis measuring 8.5 cm. b.) Pelvis US  10/22/2021: adnexal cyst measuring 8.0 x 8.2 x 7.9 (volume 270 cm). c.) Pelvis US  01/17/2022: cystic mass measured 8.2 x 8.0 x 8.3 (volume 290 cm).   Anxiety    a.) on BZO (lorazepam ) therapy PRN   Arthritis    Asthma    B12 deficiency    Benign essential tremor    Bleeding gastric ulcer    Bradycardia    Cervical cancer (HCC) 1978   Chronic sinus infection    Coronary artery disease    a.) CT chest 04/02/2021: coronary calcifications. b.) Lexi 01/21/2022: calcifications near [facility]   Depression    Diastolic dysfunction    a.) TTE 05/24/2012: EF 55-65%; triv MR, mild PR; G1DD. b.) TTE 05/24/2012: EF 55-65%; mild LVH; triv MR, mild TR/PR; G1DD. c.) TTE 04/15/2015: EF 60-65%; mild LVH; mild LA dilation; mild PR, triv TR; G2DD. d.) TTE 07/03/20 and 09/28/2021: EF 60-65%; mild LVH; G1DD.   Fracture, sacrum/coccyx (HCC)    GERD (gastroesophageal reflux disease)    Hiatal hernia    History of kidney stones    Hyperlipidemia    Hypertension    Insomnia    a.) takes trazodone  PRN   Low back pain    Mild dementia (HCC)    Nasal polyps    Normal nuclear stress test    a. 07/2005 Nl nuc stress test.   Osteopenia     Osteoporosis    Palpitations    a. June 2013:  Event recorder normal sinus rhythm with rare PVCs - ? symptomatic PVC's.   Parkinsonian features    a.) resting tremors; LEFT hemibody Parkinsonism. b.) takes primidone    Pericardial effusion    a.) TTE 05/24/2012: small. b.) TTE 04/15/2015: trivial. c.) TTE 09/28/2021: small.   Pneumonia    PONV (postoperative nausea and vomiting)    Thyroid  nodule    Vitamin D  deficiency     Past Surgical History:  Procedure Laterality Date   BACK SURGERY  08/2010   BLADDER SUSPENSION  2003   bone spur  2009   removed from right collar bone area and partial collar bone removed   CATARACT EXTRACTION W/ INTRAOCULAR LENS  IMPLANT, BILATERAL     ESOPHAGEAL DILATION N/A    EYE SURGERY     LUMBAR FUSION  2011, 2012   ROBOTIC ASSISTED SALPINGO OOPHERECTOMY Bilateral 03/02/2022   Procedure: XI ROBOTIC ASSISTED SALPINGO OOPHORECTOMY;  Surgeon: Victor Claudell SAUNDERS, MD;  Location: [facility] ORS;  Service: Gynecology;  Laterality: Bilateral;   SHOULDER ARTHROSCOPY Right 2012   THYROIDECTOMY, PARTIAL  1976   TONSILLECTOMY     as child   TOTAL KNEE ARTHROPLASTY Right 03/09/2021   Procedure: Right TOTAL KNEE ARTHROPLASTY;  Surgeon: Marchia Drivers, MD;  Location: [facility]  ORS;  Service: Orthopedics;  Laterality: Right;   TUBAL LIGATION  1977   VAGINAL HYSTERECTOMY  1978   d/t cervical cancer    Family History Mother-colon cancer No other h/o GI disease or malignancy  Review of Systems  Constitutional:  Negative for activity change, appetite change, chills, diaphoresis, fatigue, fever and unexpected weight change.  HENT:  Negative for trouble swallowing and voice change.   Respiratory:  Negative for shortness of breath and wheezing.   Cardiovascular:  Negative for chest pain, palpitations and leg swelling.  Gastrointestinal:  Positive for diarrhea. Negative for abdominal distention, abdominal pain, anal bleeding, blood in stool, constipation, nausea, rectal pain  and vomiting.  Musculoskeletal:  Negative for arthralgias and myalgias.  Skin:  Negative for color change and pallor.  Neurological:  Negative for dizziness, syncope and weakness.  Psychiatric/Behavioral:  Negative for confusion.   All other systems reviewed and are negative.    Medications No current facility-administered medications on file prior to encounter.   Current Outpatient Medications on File Prior to Encounter  Medication Sig Dispense Refill   amLODipine  (NORVASC ) 5 MG tablet TAKE 1 TABLET EVERY DAY 90 tablet 3   Calcium  Carb-Cholecalciferol  (CALCIUM  600+D3) 600-800 MG-UNIT TABS Take 1 tablet by mouth daily.     cholecalciferol  (VITAMIN D ) 25 MCG tablet Take 1 tablet (1,000 Units total) by mouth daily. 30 tablet 0   citalopram  (CELEXA ) 40 MG tablet Take 40 mg by mouth daily.     colestipol  (COLESTID ) 1 g tablet Take by mouth.     fexofenadine (ALLEGRA) 180 MG tablet Take 180 mg by mouth at bedtime.     LORazepam  (ATIVAN ) 0.5 MG tablet TAKE ONE TABLET (0.5 MG TOTAL) BY MOUTH DAILY AS NEEDED FOR ANXIETY. 30 tablet 3   mirtazapine (REMERON) 30 MG tablet Take 30 mg by mouth at bedtime.     primidone  (MYSOLINE ) 50 MG tablet Take 50 mg by mouth at bedtime.     QUEtiapine (SEROQUEL) 25 MG tablet Take 50 mg by mouth at bedtime.     rosuvastatin  (CRESTOR ) 40 MG tablet Take 1 tablet (40 mg total) by mouth at bedtime. 90 tablet 3   sertraline (ZOLOFT) 25 MG tablet Take 25 mg by mouth daily.     traZODone  (DESYREL ) 100 MG tablet Take 200 mg by mouth at bedtime as needed.     vitamin B-12 (CYANOCOBALAMIN ) 1000 MCG tablet Take 1,000 mcg by mouth daily.     acetaminophen  (TYLENOL ) 500 MG tablet Take 2 tablets (1,000 mg total) by mouth every 6 (six) hours. 60 tablet 0   albuterol  (VENTOLIN  HFA) 108 (90 Base) MCG/ACT inhaler Inhale 1-2 puffs into the lungs every 6 (six) hours as needed for wheezing or shortness of breath. 8 g 2   Budeson-Glycopyrrol-Formoterol  (BREZTRI  AEROSPHERE) 160-9-4.8  MCG/ACT AERO Inhale 2 puffs into the lungs in the morning and at bedtime. 10.7 g 0   ipratropium-albuterol  (DUONEB) 0.5-2.5 (3) MG/3ML SOLN Take 3 mLs by nebulization every 6 (six) hours as needed. 360 mL 0   Melatonin 5 MG CHEW Chew 10 mg by mouth at bedtime. (Patient not taking: Reported on 05/24/2023)     Menthol , Topical Analgesic, (BIOFREEZE EX) Apply 1 application. topically daily as needed (pain).     Nebulizer MISC Use with nebulized medication q 6 hours 1 each 0   nitrofurantoin  (MACRODANTIN ) 100 MG capsule Take 100 mg by mouth 2 (two) times daily. (Patient not taking: Reported on 05/24/2023)     predniSONE  (DELTASONE ) 20  MG tablet Take 1 tablet (20 mg total) by mouth daily with breakfast. (Patient not taking: Reported on 05/24/2023) 5 tablet 0   Respiratory Therapy Supplies (NEBULIZER MASK ADULT) MISC Use with nebulized medication q 6 hours 1 each 0    Pertinent medications related to GI and procedure were reviewed by me with the patient prior to the procedure   Current Facility-Administered Medications:    0.9 %  sodium chloride  infusion, , Intravenous, Continuous, Onita Elspeth Sharper, DO, Last Rate: 20 mL/hr at 06/20/24 1002, New Bag at 06/20/24 1002  sodium chloride  20 mL/hr at 06/20/24 1002       Allergies  Allergen Reactions   Anti-Inflammatory Enzyme [Nutritional Supplements] Other (See Comments)    REACTION: Due to bleeding ulcer   Cymbalta [Duloxetine Hcl] Other (See Comments)    REACTION: Urinary retention   Clarithromycin Other (See Comments)    Upsets stomach   Clarithromycin     GI upset   Donepezil  Hcl Other (See Comments)    Significant bradycardia causing syncope   Nsaids     GI BLEED   Ranitidine Diarrhea    severe diarrhea   Venlafaxine     severe depression   Allergies were reviewed by me prior to the procedure  Objective   Body mass index is 30.04 kg/m. Vitals:   06/20/24 1002  BP: (!) 165/75  Pulse: (!) 55  Resp: 20  Temp: (!) 96.9 F  (36.1 C)  TempSrc: Temporal  SpO2: 97%  Weight: 87 kg  Height: 5' 7 (1.702 m)     Physical Exam Vitals and nursing note reviewed.  Constitutional:      General: She is not in acute distress.    Appearance: Normal appearance. She is not ill-appearing, toxic-appearing or diaphoretic.  HENT:     Head: Normocephalic and atraumatic.     Nose: Nose normal.     Mouth/Throat:     Mouth: Mucous membranes are moist.     Pharynx: Oropharynx is clear.  Eyes:     General: No scleral icterus.    Extraocular Movements: Extraocular movements intact.  Cardiovascular:     Rate and Rhythm: Regular rhythm. Bradycardia present.     Heart sounds: Normal heart sounds. No murmur heard.    No friction rub. No gallop.  Pulmonary:     Effort: Pulmonary effort is normal. No respiratory distress.     Breath sounds: Normal breath sounds. No wheezing, rhonchi or rales.  Abdominal:     General: Bowel sounds are normal. There is no distension.     Palpations: Abdomen is soft.     Tenderness: There is no abdominal tenderness. There is no guarding or rebound.  Musculoskeletal:     Cervical back: Neck supple.     Right lower leg: No edema.     Left lower leg: No edema.  Skin:    General: Skin is warm and dry.     Coloration: Skin is not jaundiced or pale.  Neurological:     General: No focal deficit present.     Mental Status: She is alert and oriented to person, place, and time. Mental status is at baseline.  Psychiatric:        Mood and Affect: Mood normal.        Behavior: Behavior normal.        Thought Content: Thought content normal.        Judgment: Judgment normal.      Assessment:  [name] [facility]  is a 78 y.o. female  who presents today for Colonoscopy for Screening-family history of colon cancer .  Plan:  Colonoscopy with possible intervention today  Colonoscopy with possible biopsy, control of bleeding, polypectomy, and interventions as necessary has been discussed with  the patient/patient representative. Informed consent was obtained from the patient/patient representative after explaining the indication, nature, and risks of the procedure including but not limited to death, bleeding, perforation, missed neoplasm/lesions, cardiorespiratory compromise, and reaction to medications. Opportunity for questions was given and appropriate answers were provided. Patient/patient representative has verbalized understanding is amenable to undergoing the procedure.   Elspeth Ozell Jungling, DO  [facility] Gastroenterology  Portions of the record may have been created with voice recognition software. Occasional wrong-word or 'sound-a-like' substitutions may have occurred due to the inherent limitations of voice recognition software.  Read the chart carefully and recognize, using context, where substitutions may have occurred.

## 2024-06-20 NOTE — Interval H&P Note (Signed)
 History and Physical Interval Note: Preprocedure H&P from 06/20/24  was reviewed and there was no interval change after seeing and examining the patient.  Written consent was obtained from the patient after discussion of risks, benefits, and alternatives. Patient has consented to proceed with Colonoscopy with possible intervention   06/20/2024 10:16 AM  Nancy Blackburn  has presented today for surgery, with the diagnosis of Family history of colon cancer (Z80.0).  The various methods of treatment have been discussed with the patient and family. After consideration of risks, benefits and other options for treatment, the patient has consented to  Procedure(s): COLONOSCOPY (N/A) as a surgical intervention.  The patient's history has been reviewed, patient examined, no change in status, stable for surgery.  I have reviewed the patient's chart and labs.  Questions were answered to the patient's satisfaction.     Elspeth Ozell Jungling

## 2024-06-20 NOTE — Anesthesia Preprocedure Evaluation (Signed)
 Anesthesia Evaluation  Patient identified by MRN, date of birth, ID band Patient awake    Reviewed: Allergy & Precautions, NPO status , Patient's Chart, lab work & pertinent test results  History of Anesthesia Complications (+) PONV and history of anesthetic complications  Airway Mallampati: III  TM Distance: <3 FB Neck ROM: full    Dental  (+) Chipped, Poor Dentition, Missing   Pulmonary shortness of breath and with exertion, asthma    Pulmonary exam normal        Cardiovascular Exercise Tolerance: Good hypertension, + CAD  Normal cardiovascular exam     Neuro/Psych  PSYCHIATRIC DISORDERS       Neuromuscular disease    GI/Hepatic Neg liver ROS, hiatal hernia, PUD,GERD  Controlled,,  Endo/Other  negative endocrine ROS    Renal/GU Renal disease  negative genitourinary   Musculoskeletal   Abdominal   Peds  Hematology negative hematology ROS (+)   Anesthesia Other Findings Past Medical History: 10/18/2021: Adnexal cyst     Comment:  a.) CT abdomen: large cystic mass in the LEFT hemipelvis              measuring 8.5 cm. b.) Pelvis US  10/22/2021: adnexal cyst               measuring 8.0 x 8.2 x 7.9 (volume 270 cm). c.) Pelvis US               01/17/2022: cystic mass measured 8.2 x 8.0 x 8.3 (volume               290 cm). No date: Anxiety     Comment:  a.) on BZO (lorazepam ) therapy PRN No date: Arthritis No date: Asthma No date: B12 deficiency No date: Benign essential tremor No date: Bleeding gastric ulcer No date: Bradycardia 1978: Cervical cancer (HCC) No date: Chronic sinus infection No date: Coronary artery disease     Comment:  a.) CT chest 04/02/2021: coronary calcifications. b.)               Lexi 01/21/2022: calcifications near [facility]. No date: Depression No date: Diastolic dysfunction     Comment:  a.) TTE 05/24/2012: EF 55-65%; triv MR, mild PR; G1DD.               b.) TTE 05/24/2012: EF 55-65%;  mild LVH; triv MR, mild               TR/PR; G1DD. c.) TTE 04/15/2015: EF 60-65%; mild LVH;               mild LA dilation; mild PR, triv TR; G2DD. d.) TTE               07/03/20 and 09/28/2021: EF 60-65%; mild LVH; G1DD. No date: Fracture, sacrum/coccyx (HCC) No date: GERD (gastroesophageal reflux disease) No date: Hiatal hernia No date: History of kidney stones No date: Hyperlipidemia No date: Hypertension No date: Insomnia     Comment:  a.) takes trazodone  PRN No date: Low back pain No date: Mild dementia (HCC) No date: Nasal polyps No date: Normal nuclear stress test     Comment:  a. 07/2005 Nl nuc stress test. No date: Osteopenia No date: Osteoporosis No date: Palpitations     Comment:  a. June 2013:  Event recorder normal sinus rhythm with               rare PVCs - ? symptomatic PVC's. No date: Parkinsonian features     Comment:  a.) resting tremors; LEFT hemibody Parkinsonism. b.)               takes primidone  No date: Pericardial effusion     Comment:  a.) TTE 05/24/2012: small. b.) TTE 04/15/2015: trivial.               c.) TTE 09/28/2021: small. No date: Pneumonia No date: PONV (postoperative nausea and vomiting) No date: Thyroid  nodule No date: Vitamin D  deficiency  Past Surgical History: 08/2010: BACK SURGERY 2003: BLADDER SUSPENSION 2009: bone spur     Comment:  removed from right collar bone area and partial collar               bone removed No date: CATARACT EXTRACTION W/ INTRAOCULAR LENS  IMPLANT, BILATERAL No date: ESOPHAGEAL DILATION; N/A No date: EYE SURGERY 2011, 2012: LUMBAR FUSION 03/02/2022: ROBOTIC ASSISTED SALPINGO OOPHERECTOMY; Bilateral     Comment:  Procedure: XI ROBOTIC ASSISTED SALPINGO OOPHORECTOMY;                Surgeon: Victor Claudell SAUNDERS, MD;  Location: [facility] ORS;               Service: Gynecology;  Laterality: Bilateral; 2012: SHOULDER ARTHROSCOPY; Right 1976: THYROIDECTOMY, PARTIAL No date: TONSILLECTOMY     Comment:  as  child 03/09/2021: TOTAL KNEE ARTHROPLASTY; Right     Comment:  Procedure: Right TOTAL KNEE ARTHROPLASTY;  Surgeon:               Marchia Drivers, MD;  Location: [facility] ORS;  Service:               Orthopedics;  Laterality: Right; 1977: TUBAL LIGATION 1978: VAGINAL HYSTERECTOMY     Comment:  d/t cervical cancer  BMI    Body Mass Index: 30.04 kg/m      Reproductive/Obstetrics negative OB ROS                              Anesthesia Physical Anesthesia Plan  ASA: 3  Anesthesia Plan: General   Post-op Pain Management:    Induction: Intravenous  PONV Risk Score and Plan: Propofol  infusion and TIVA  Airway Management Planned: Natural Airway and Nasal Cannula  Additional Equipment:   Intra-op Plan:   Post-operative Plan:   Informed Consent: I have reviewed the patients History and Physical, chart, labs and discussed the procedure including the risks, benefits and alternatives for the proposed anesthesia with the patient or authorized representative who has indicated his/her understanding and acceptance.     Dental Advisory Given  Plan Discussed with: Anesthesiologist, CRNA and Surgeon  Anesthesia Plan Comments: (Patient consented for risks of anesthesia including but not limited to:  - adverse reactions to medications - risk of airway placement if required - damage to eyes, teeth, lips or other oral mucosa - nerve damage due to positioning  - sore throat or hoarseness - Damage to heart, brain, nerves, lungs, other parts of body or loss of life  Patient voiced understanding and assent.)        Anesthesia Quick Evaluation

## 2024-06-20 NOTE — Transfer of Care (Signed)
 Immediate Anesthesia Transfer of Care Note  [name]  Procedure(s) Performed: COLONOSCOPY POLYPECTOMY, INTESTINE  Patient Location: PACU  Anesthesia Type:General  Level of Consciousness: sedated  Airway & Oxygen  Therapy: Patient Spontanous Breathing  Post-op Assessment: Report given to RN and Post -op Vital signs reviewed and stable  Post vital signs: Reviewed and stable  Last Vitals:  Vitals Value Taken Time  BP 121/63 06/20/24 10:49  Temp    Pulse 32 06/20/24 10:49  Resp 22 06/20/24 10:49  SpO2 97 % 06/20/24 10:49  Vitals shown include unfiled device data.  Last Pain:  Vitals:   06/20/24 1002  TempSrc: Temporal  PainSc: 0-No pain         Complications: No notable events documented.

## 2024-06-21 LAB — SURGICAL PATHOLOGY

## 2024-07-05 ENCOUNTER — Ambulatory Visit: Payer: Self-pay

## 2024-07-05 NOTE — Telephone Encounter (Signed)
 FYI Only or Action Required?: FYI only for provider.  Patient is followed in Pulmonology for Asthma, last seen on 05/18/2023 by Parrett, Madelin RAMAN, NP.  Called Nurse Triage reporting No chief complaint on file..  Symptoms began several months ago.  Interventions attempted: Nothing.  Symptoms are: gradually worsening.  Triage Disposition: No disposition on file.  Patient/caregiver understands and will follow disposition?: FYI Only or Action Required?: FYI only for provider.  Patient is followed in Pulmonology for Asthma, last seen on 05/18/2023 by Parrett, Madelin RAMAN, NP.  Called Nurse Triage reporting No chief complaint on file..  Symptoms began several months ago.  Interventions attempted: Increased fluids/rest.  Symptoms are: gradually worsening.  Triage Disposition: No disposition on file.  Patient/caregiver understands and will follow disposition?:    Copied from CRM [phone]. Topic: Clinical - Red Word Triage >> Jul 05, 2024  1:52 PM Shona RAMAN wrote: Red Word that prompted transfer to Nurse Triage: patient daughter is calling with her mother because mother is experiencing shortness of breath. Reason for Disposition  [1] MODERATE longstanding difficulty breathing (e.g., speaks in phrases, SOB even at rest, pulse 100-120) AND [2] SAME as normal  Answer Assessment - Initial Assessment Questions 1. RESPIRATORY STATUS: Describe your breathing? (e.g., wheezing, shortness of breath, unable to speak, severe coughing)      Dyspnea with exertion ' 2. ONSET: When did this breathing problem begin?      Over the last couple of months  3. PATTERN Does the difficult breathing come and go, or has it been constant since it started?      Intermittent  4. SEVERITY: How bad is your breathing? (e.g., mild, moderate, severe)      Moderate  5. RECURRENT SYMPTOM: Have you had difficulty breathing before? If Yes, ask: When was the last time? and What happened that time?       No  6. CARDIAC HISTORY: Do you have any history of heart disease? (e.g., heart attack, angina, bypass surgery, angioplasty)      Pericardial Effusion  7. LUNG HISTORY: Do you have any history of lung disease?  (e.g., pulmonary embolus, asthma, emphysema)     Positive lung history, caller did not specifiy.  8. CAUSE: What do you think is causing the breathing problem?       Unsure  9. OTHER SYMPTOMS: Do you have any other symptoms? (e.g., chest pain, cough, dizziness, fever, runny nose)     No other symptoms  10. O2 SATURATION MONITOR:  Do you use an oxygen  saturation monitor (pulse oximeter) at home? If Yes, ask: What is your reading (oxygen  level) today? What is your usual oxygen  saturation reading? (e.g., 95%)       No oxygen , or pulse oximeter  11. PREGNANCY: Is there any chance you are pregnant? When was your last menstrual period?       No and No  12. TRAVEL: Have you traveled out of the country in the last month? (e.g., travel history, exposures)       No  Protocols used: Breathing Difficulty-A-AH

## 2024-07-11 ENCOUNTER — Encounter: Payer: Self-pay | Admitting: Physician Assistant

## 2024-07-11 ENCOUNTER — Ambulatory Visit: Attending: Physician Assistant | Admitting: Physician Assistant

## 2024-07-11 ENCOUNTER — Ambulatory Visit

## 2024-07-11 VITALS — BP 138/78 | HR 82 | Ht 67.0 in | Wt 199.6 lb

## 2024-07-11 DIAGNOSIS — R001 Bradycardia, unspecified: Secondary | ICD-10-CM | POA: Insufficient documentation

## 2024-07-11 DIAGNOSIS — R0602 Shortness of breath: Secondary | ICD-10-CM | POA: Insufficient documentation

## 2024-07-11 DIAGNOSIS — I2510 Atherosclerotic heart disease of native coronary artery without angina pectoris: Secondary | ICD-10-CM | POA: Diagnosis not present

## 2024-07-11 DIAGNOSIS — E785 Hyperlipidemia, unspecified: Secondary | ICD-10-CM | POA: Diagnosis present

## 2024-07-11 DIAGNOSIS — I493 Ventricular premature depolarization: Secondary | ICD-10-CM | POA: Diagnosis present

## 2024-07-11 DIAGNOSIS — Z87898 Personal history of other specified conditions: Secondary | ICD-10-CM | POA: Insufficient documentation

## 2024-07-11 DIAGNOSIS — I10 Essential (primary) hypertension: Secondary | ICD-10-CM | POA: Diagnosis present

## 2024-07-11 NOTE — Progress Notes (Signed)
 Cardiology Office Note    Date:  07/11/2024   ID:  Bedie, Dominey [birth year], MRN [id number]  PCP:  Bertrum Charlie CROME, MD  Cardiologist:  Redell Cave, MD  Electrophysiologist:  None   Chief Complaint: Follow-up  History of Present Illness:   Nancy Blackburn is a 78 y.o. female with history of CAD medically managed, frequent PVCs, symptomatic bradycardia with associated dizziness and syncope in 2021 on beta-blocker, Parkinson's disease with mild cognitive impairment, upper GI bleed secondary to PUD from NSAIDs in 2014 requiring multiple units of PRBC, HTN, HLD, and asthma who presents for follow-up of CAD, frequent PVCs, and symptomatic bradycardia.  Remote echo from 2016 showed an EF of 60 to 65%, mild LVH, no regional wall motion abnormalities, grade 2 diastolic dysfunction, mildly dilated left atrium, and trivial pericardial effusion.  Lexiscan  MPI in 04/2015 was low risk, without evidence of ischemia, and with normal LVEF.  She was admitted to the hospital in 06/2020 with dizziness, presyncope, and bradycardia with heart rate in the 30s bpm.  Propranolol  and Aricept  were discontinued.  Echo showed an EF of 60 to 65%, no regional wall motion abnormalities, mild LVH, grade 1 diastolic dysfunction, normal RV systolic function and ventricular cavity size, normal RVSP, trivial mitral regurgitation.  Outpatient cardiac monitoring showed sinus rhythm with frequent PVCs with an overall burden of 25%.  Plan was to avoid beta-blockers despite frequent PVCs due to history of dizziness and syncope with beta-blocker use; avoiding amiodarone for similar reasons.  With discontinuation of beta-blocker, heart rate improved.  In 09/2021, she reported increased shortness of breath following surgery.  CT of the chest was negative for PE.  Echo in 09/2021 showed an EF of 60 to 65%, no regional wall motion abnormalities, mild concentric LVH of the basal septal segment, grade 1 diastolic  dysfunction, no significant LVOT gradient measured, normal RV systolic function, cavity size, and RVSP, small pericardial effusion, mild mitral regurgitation, mild aortic valve sclerosis without evidence of stenosis, and an estimated right atrial pressure of 3 mmHg.  She was seen in 01/2022 reporting occasional shortness of breath with Lexiscan  MPI in 01/2022 showing no evidence of ischemia or scar with an EF of greater than 65% and was overall low risk.  Coronary calcification was noted near the ostium of the RCA.  Also incidentally noted was aortic atherosclerosis and a small pericardial effusion.   She was last seen in the office in 05/2023 and was without symptoms of angina or cardiac decompensation.  She reported having had a syncopal event in the setting of UTI (the second time this happened).  No changes or further testing were pursued at that time.  She was evaluated by PCP in 11/2023 with complaints of black stool with recommendation to discontinue iron supplementation.  Subsequent colonoscopy in 06/2024 with several polyps removed with pathology negative for high-grade dysplasia or colitis.  She comes in today reporting she was told she was in atrial fibrillation prior to colonoscopy.  There is no EKG or tracing confirming this.  She was without symptoms of palpitations at that time.  There has been no documented evidence of A-fib on cardiac monitoring to date.  She does report a longstanding history of exertional shortness of breath that she attributes to her asthma and became worse after discontinuing her inhaler.  She reports she plans to follow-up with pulmonology to discuss different inhaler options for her underlying asthma.  No dizziness, presyncope, or syncope.  No significant lower extremity  swelling or progressive orthopnea.   Labs independently reviewed: 03/2024 - potassium 5.2, BUN 24, serum creatinine 1.1, albumin 3.6, AST/ALT normal, TC 185, TG 131, HDL 52, LDL 106, Hgb 14.1, PLT  215 11/2023 - TSH normal  Past Medical History:  Diagnosis Date   Adnexal cyst 10/18/2021   a.) CT abdomen: large cystic mass in the LEFT hemipelvis measuring 8.5 cm. b.) Pelvis US  10/22/2021: adnexal cyst measuring 8.0 x 8.2 x 7.9 (volume 270 cm). c.) Pelvis US  01/17/2022: cystic mass measured 8.2 x 8.0 x 8.3 (volume 290 cm).   Anxiety    a.) on BZO (lorazepam ) therapy PRN   Arthritis    Asthma    B12 deficiency    Benign essential tremor    Bleeding gastric ulcer    Bradycardia    Cervical cancer (HCC) 1978   Chronic sinus infection    Coronary artery disease    a.) CT chest 04/02/2021: coronary calcifications. b.) Lexi 01/21/2022: calcifications near [facility].   Depression    Diastolic dysfunction    a.) TTE 05/24/2012: EF 55-65%; triv MR, mild PR; G1DD. b.) TTE 05/24/2012: EF 55-65%; mild LVH; triv MR, mild TR/PR; G1DD. c.) TTE 04/15/2015: EF 60-65%; mild LVH; mild LA dilation; mild PR, triv TR; G2DD. d.) TTE 07/03/20 and 09/28/2021: EF 60-65%; mild LVH; G1DD.   Fracture, sacrum/coccyx (HCC)    GERD (gastroesophageal reflux disease)    Hiatal hernia    History of kidney stones    Hyperlipidemia    Hypertension    Insomnia    a.) takes trazodone  PRN   Low back pain    Mild dementia (HCC)    Nasal polyps    Normal nuclear stress test    a. 07/2005 Nl nuc stress test.   Osteopenia    Osteoporosis    Palpitations    a. June 2013:  Event recorder normal sinus rhythm with rare PVCs - ? symptomatic PVC's.   Parkinsonian features    a.) resting tremors; LEFT hemibody Parkinsonism. b.) takes primidone    Pericardial effusion    a.) TTE 05/24/2012: small. b.) TTE 04/15/2015: trivial. c.) TTE 09/28/2021: small.   Pneumonia    PONV (postoperative nausea and vomiting)    Thyroid  nodule    Vitamin D  deficiency     Past Surgical History:  Procedure Laterality Date   BACK SURGERY  08/2010   BLADDER SUSPENSION  2003   bone spur  2009   removed from right collar bone area and  partial collar bone removed   CATARACT EXTRACTION W/ INTRAOCULAR LENS  IMPLANT, BILATERAL     COLONOSCOPY N/A 06/20/2024   Procedure: COLONOSCOPY;  Surgeon: Onita Elspeth Sharper, DO;  Location: [facility] ENDOSCOPY;  Service: Gastroenterology;  Laterality: N/A;   ESOPHAGEAL DILATION N/A    EYE SURGERY     LUMBAR FUSION  2011, 2012   POLYPECTOMY  06/20/2024   Procedure: POLYPECTOMY, INTESTINE;  Surgeon: Onita Elspeth Sharper, DO;  Location: [facility] ENDOSCOPY;  Service: Gastroenterology;;   ROBOTIC ASSISTED SALPINGO OOPHERECTOMY Bilateral 03/02/2022   Procedure: XI ROBOTIC ASSISTED SALPINGO OOPHORECTOMY;  Surgeon: Victor Claudell SAUNDERS, MD;  Location: [facility] ORS;  Service: Gynecology;  Laterality: Bilateral;   SHOULDER ARTHROSCOPY Right 2012   THYROIDECTOMY, PARTIAL  1976   TONSILLECTOMY     as child   TOTAL KNEE ARTHROPLASTY Right 03/09/2021   Procedure: Right TOTAL KNEE ARTHROPLASTY;  Surgeon: Marchia Drivers, MD;  Location: [facility] ORS;  Service: Orthopedics;  Laterality: Right;   TUBAL LIGATION  1977  VAGINAL HYSTERECTOMY  1978   d/t cervical cancer    Current Medications: Current Meds  Medication Sig   acetaminophen  (TYLENOL ) 500 MG tablet Take 2 tablets (1,000 mg total) by mouth every 6 (six) hours.   albuterol  (VENTOLIN  HFA) 108 (90 Base) MCG/ACT inhaler Inhale 1-2 puffs into the lungs every 6 (six) hours as needed for wheezing or shortness of breath.   amLODipine  (NORVASC ) 5 MG tablet TAKE 1 TABLET EVERY DAY   Calcium  Carb-Cholecalciferol  (CALCIUM  600+D3) 600-800 MG-UNIT TABS Take 1 tablet by mouth daily.   citalopram  (CELEXA ) 40 MG tablet Take 40 mg by mouth daily.   colestipol  (COLESTID ) 1 g tablet Take by mouth.   fexofenadine (ALLEGRA) 180 MG tablet Take 180 mg by mouth at bedtime.   LORazepam  (ATIVAN ) 0.5 MG tablet TAKE ONE TABLET (0.5 MG TOTAL) BY MOUTH DAILY AS NEEDED FOR ANXIETY.   Menthol , Topical Analgesic, (BIOFREEZE EX) Apply 1 application. topically daily as needed (pain).    primidone  (MYSOLINE ) 50 MG tablet Take 50 mg by mouth at bedtime.   rosuvastatin  (CRESTOR ) 40 MG tablet Take 1 tablet (40 mg total) by mouth at bedtime.   traZODone  (DESYREL ) 100 MG tablet Take 200 mg by mouth at bedtime as needed.   trimethoprim  (TRIMPEX ) 100 MG tablet Take 100 mg by mouth daily. (Patient taking differently: Take 100 mg by mouth at bedtime.)   vitamin B-12 (CYANOCOBALAMIN ) 1000 MCG tablet Take 1,000 mcg by mouth daily.    Allergies:   Anti-inflammatory enzyme [nutritional supplements], Cymbalta [duloxetine hcl], Clarithromycin, Clarithromycin, Donepezil  hcl, Nsaids, Ranitidine, and Venlafaxine   Social History   Socioeconomic History   Marital status: Divorced    Spouse name: Not on file   Number of children: 2   Years of education: Not on file   Highest education level: 12th grade  Occupational History   Occupation: retired  Tobacco Use   Smoking status: Never   Smokeless tobacco: Never  Vaping Use   Vaping status: Never Used  Substance and Sexual Activity   Alcohol  use: Yes    Comment: 1 glass of wine or mixed drink every 2-3 months   Drug use: No   Sexual activity: Never  Other Topics Concern   Not on file  Social History Narrative   Grandson lives with her   Social Drivers of Health   Financial Resource Strain: Low Risk  (06/17/2024)   Overall Financial Resource Strain (CARDIA)    Difficulty of Paying Living Expenses: Not hard at all  Food Insecurity: No Food Insecurity (06/17/2024)   Hunger Vital Sign    Worried About Running Out of Food in the Last Year: Never true    Ran Out of Food in the Last Year: Never true  Transportation Needs: No Transportation Needs (06/17/2024)   PRAPARE - Administrator, Civil Service (Medical): No    Lack of Transportation (Non-Medical): No  Physical Activity: Insufficiently Active (06/17/2024)   Exercise Vital Sign    Days of Exercise per Week: 2 days    Minutes of Exercise per Session: 40 min  Stress: No  Stress Concern Present (06/17/2024)   [employer] of Occupational Health - Occupational Stress Questionnaire    Feeling of Stress: Only a little  Social Connections: Moderately Integrated (06/17/2024)   Social Connection and Isolation Panel    Frequency of Communication with Friends and Family: Twice a week    Frequency of Social Gatherings with Friends and Family: Once a week    Attends  Religious Services: More than 4 times per year    Active Member of Clubs or Organizations: Yes    Attends Banker Meetings: Not on file    Marital Status: Divorced     Family History:  The patient's family history includes Angina in her mother; Atrial fibrillation in her brother; Breast cancer in her cousin; COPD in her father; Cancer in her daughter; Colon cancer in her mother; Cushing syndrome in her daughter; Heart disease in her father and mother; Heart murmur in her mother. There is no history of Hypotension, Malignant hyperthermia, or Pseudochol deficiency.  ROS:   12-point review of systems is negative unless otherwise noted in the HPI.   EKGs/Labs/Other Studies Reviewed:    Studies reviewed were summarized above. The additional studies were reviewed today:  Lexiscan  MPI 01/21/2022:   Normal pharmacologic myocardial perfusion stress test without evidence of significant ischemia or scar.   Left ventricular function is normal (LVEF > 65%).   Calcification near the ostium of the RCA as well as aortic atherosclerosis are noted.  There is also a small pericardial effusion.   This is a low risk study. __________  2D echo 09/28/2021: 1. Left ventricular ejection fraction, by estimation, is 60 to 65%. The  left ventricle has normal function. The left ventricle has no regional  wall motion abnormalities. There is mild concentric left ventricular  hypertrophy with moderaet thickening of  the basal-septal segment. Left ventricular diastolic parameters are  consistent with Grade I  diastolic dysfunction (impaired relaxation). No  significant LVOT gradient measured.   2. Right ventricular systolic function is normal. The right ventricular  size is normal. There is normal pulmonary artery systolic pressure. The  estimated right ventricular systolic pressure is 32.5 mmHg.   3. A small pericardial effusion is present.   4. The mitral valve is normal in structure. Mild mitral valve  regurgitation. No evidence of mitral stenosis.   5. The aortic valve is normal in structure. Aortic valve regurgitation is  not visualized. Mild aortic valve sclerosis is present, with no evidence  of aortic valve stenosis.   6. The inferior vena cava is normal in size with greater than 50%  respiratory variability, suggesting right atrial pressure of 3 mmHg.   Comparison(s): LVEF 60-65%.  __________  Zio patch 08/2020: No significant arrhythmias noted. Patient triggered events were associated with ventricular ectopies. Isolated ventricular ectopies with frequent 25%. __________  2D echo 07/03/2020: 1. Left ventricular ejection fraction, by estimation, is 60 to 65%. The  left ventricle has normal function. The left ventricle has no regional  wall motion abnormalities. There is mild left ventricular hypertrophy.  Left ventricular diastolic parameters  are consistent with Grade I diastolic dysfunction (impaired relaxation).   2. Right ventricular systolic function is normal. The right ventricular  size is normal. There is normal pulmonary artery systolic pressure.   3. The mitral valve was not well visualized. Trivial mitral valve  regurgitation.   4. The aortic valve was not well visualized. Aortic valve regurgitation  is not visualized.  __________  Lexiscan  MPI 04/15/2015: Low risk myocardial perfusion study with a small in size, mild in intensity fixed defect in the inferolateral wall consistent with breast attenuation artifact. No ischemia noted. Normal LVF.  __________  2D echo  04/15/2015: - Left ventricle: The cavity size was normal. Wall thickness was    increased in a pattern of mild LVH. Systolic function was normal.    The estimated ejection fraction was in  the range of 60% to 65%.    Wall motion was normal; there were no regional wall motion    abnormalities. Features are consistent with a pseudonormal left    ventricular filling pattern, with concomitant abnormal relaxation    and increased filling pressure (grade 2 diastolic dysfunction).  - Left atrium: The atrium was mildly dilated.  - Pericardium, extracardiac: A trivial pericardial effusion was    identified.    EKG:  EKG is ordered today.  The EKG ordered today demonstrates NSR, 82 bpm, left axis deviation, left atrial enlargement, LVH with early repolarization abnormality, occasional isolated PVC  Recent Labs: No results found for requested labs within last 365 days.  Recent Lipid Panel    Component Value Date/Time   CHOL 172 02/23/2022 0940   CHOL 280 (H) 01/18/2022 1552   TRIG 197 (H) 02/23/2022 0940   HDL 50 02/23/2022 0940   HDL 45 01/18/2022 1552   CHOLHDL 3.4 02/23/2022 0940   VLDL 39 02/23/2022 0940   LDLCALC 83 02/23/2022 0940   LDLCALC 183 (H) 01/18/2022 1552   LDLDIRECT 186 (H) 01/18/2022 1552    PHYSICAL EXAM:    VS:  BP 138/78   Pulse 82   Ht 5' 7 (1.702 m)   Wt 199 lb 9.6 oz (90.5 kg)   SpO2 96%   BMI 31.26 kg/m   BMI: Body mass index is 31.26 kg/m.  Physical Exam Constitutional:      Appearance: She is well-developed.  HENT:     Head: Normocephalic and atraumatic.  Eyes:     General:        Right eye: No discharge.        Left eye: No discharge.  Cardiovascular:     Rate and Rhythm: Normal rate and regular rhythm.     Heart sounds: Normal heart sounds, S1 normal and S2 normal. Heart sounds not distant. No midsystolic click and no opening snap. No murmur heard.    No friction rub.  Pulmonary:     Effort: Pulmonary effort is normal. No respiratory distress.      Breath sounds: Normal breath sounds. No decreased breath sounds, wheezing, rhonchi or rales.  Musculoskeletal:     Cervical back: Normal range of motion.     Right lower leg: No edema.     Left lower leg: No edema.  Skin:    General: Skin is warm and dry.     Nails: There is no clubbing.  Neurological:     Mental Status: She is alert and oriented to person, place, and time.  Psychiatric:        Speech: Speech normal.        Behavior: Behavior normal.        Thought Content: Thought content normal.        Judgment: Judgment normal.     Wt Readings from Last 3 Encounters:  07/11/24 199 lb 9.6 oz (90.5 kg)  06/20/24 191 lb 12.8 oz (87 kg)  06/14/23 180 lb (81.6 kg)     ASSESSMENT & PLAN:   CAD involving the native coronary arteries with exertional dyspnea: She reports a long history of exertional dyspnea that has been attributed to asthma.  Prior cardiac testing showed no evidence of ischemia.  She notes an increase in exertional dyspnea following discontinuation of inhaler, for which she will follow-up with pulmonology to discuss transitioning pharmacotherapy.  We will obtain an echo to evaluate for new cardiomyopathy and if reassuring we will pursue myocardial  PET/CT.  If echo shows cardiomyopathy or wall motion abnormality concerning for ischemia would pursue diagnostic cardiac cath.  Aspirin given prior history of upper GI bleed.  Revisit based on cardiac testing.  Continue rosuvastatin  as below.  If cardiac testing is unrevealing, query if progressive exertional dyspnea is in the setting of being off inhaler, for which she will follow-up with pulmonology.  Reported Afib: Patient reports she was told she was in A-fib prior to colonoscopy earlier this month.  EKG/tracing not available for review in Epic.  This rhythm is not mentioned in documentation supporting this procedure.  She is maintaining sinus rhythm in the office today with rare PVCs on EKG.  No prior history of A-fib.   Without objective evidence of A-fib, no indication for initiation of anticoagulation given risk versus benefit.  Place Zio patch and obtain echo.  Pending results, may consider referral to EP for consideration of loop recorder.  Frequent PVCs: Asymptomatic.  Prior cardiac monitoring in 2021, in the setting of symptomatic bradycardia with dizziness and near syncope occurring while on beta-blocker leading to his discontinuation showed frequent PVCs with an overall burden of 25%.  Subsequent tracings have shown an overall improved PVC burden.  Quantify PVC burden with Zio patch as outlined above.  Symptomatic bradycardia: In the setting of AV nodal blocking medication which will continue to be avoided at this time.  Pursue Zio patch as above.  Pericardial effusion: Small on imaging with Lexiscan  MPI in 2023.  Hemodynamically stable.  Obtain echo.  History of syncope: Occurred in 2024 and felt to be in the setting of UTI.  No further episodes.  Obtain echo with plans for ischemic evaluation as outlined above as well as Zio patch as outlined above.  HTN: Blood pressure is reasonably controlled in the office today.  She remains on amlodipine  5 mg.  HLD: LDL 106 in 03/2024 with target LDL less than 70.  Currently on rosuvastatin  40 mg.  Anticipate addition of Zetia 10 mg in follow up.       Disposition: F/u with Dr. Darliss or an APP in 2 months.   Medication Adjustments/Labs and Tests Ordered: Current medicines are reviewed at length with the patient today.  Concerns regarding medicines are outlined above. Medication changes, Labs and Tests ordered today are summarized above and listed in the Patient Instructions accessible in Encounters.   Signed, Bernardino Bring, PA-C 07/11/2024 12:41 PM     [facility] [address] [place], [place] [address] [phone]

## 2024-07-11 NOTE — Patient Instructions (Signed)
 Medication Instructions:  Your physician recommends that you continue on your current medications as directed. Please refer to the Current Medication list given to you today.   *If you need a refill on your cardiac medications before your next appointment, please call your pharmacy*  Lab Work: None ordered at this time  If you have labs (blood work) drawn today and your tests are completely normal, you will receive your results only by: MyChart Message (if you have MyChart) OR A paper copy in the mail If you have any lab test that is abnormal or we need to change your treatment, we will call you to review the results.  Testing/Procedures: Your physician has requested that you have an echocardiogram. Echocardiography is a painless test that uses sound waves to create images of your heart. It provides your doctor with information about the size and shape of your heart and how well your heart's chambers and valves are working.   You may receive an ultrasound enhancing agent through an IV if needed to better visualize your heart during the echo. This procedure takes approximately one hour.  There are no restrictions for this procedure.  This will take place at [address] [facility] [address], [place] [address]  Please note: We ask at that you not bring children with you during ultrasound (echo/ vascular) testing. Due to room size and safety concerns, children are not allowed in the ultrasound rooms during exams. Our front office staff cannot provide observation of children in our lobby area while testing is being conducted. An adult accompanying a patient to their appointment will only be allowed in the ultrasound room at the discretion of the ultrasound technician under special circumstances. We apologize for any inconvenience.   Your physician has recommended that you wear a Zio monitor.   This monitor is a medical device that records the heart's electrical activity. Doctors  most often use these monitors to diagnose arrhythmias. Arrhythmias are problems with the speed or rhythm of the heartbeat. The monitor is a small device applied to your chest. You can wear one while you do your normal daily activities. While wearing this monitor if you have any symptoms to push the button and record what you felt. Once you have worn this monitor for the period of time provider prescribed (Usually 14 days), you will return the monitor device in the postage paid box. Once it is returned they will download the data collected and provide us  with a report which the provider will then review and we will call you with those results. Important tips:  Avoid showering during the first 24 hours of wearing the monitor. Avoid excessive sweating to help maximize wear time. Do not submerge the device, no hot tubs, and no swimming pools. Keep any lotions or oils away from the patch. After 24 hours you may shower with the patch on. Take brief showers with your back facing the shower head.  Do not remove patch once it has been placed because that will interrupt data and decrease adhesive wear time. Push the button when you have any symptoms and write down what you were feeling. Once you have completed wearing your monitor, remove and place into box which has postage paid and place in your outgoing mailbox.  If for some reason you have misplaced your box then call our office and we can provide another box and/or mail it off for you.   Follow-Up: At [facility], you and your health needs are  our priority.  As part of our continuing mission to provide you with exceptional heart care, our providers are all part of one team.  This team includes your primary Cardiologist (physician) and Advanced Practice Providers or APPs (Physician Assistants and Nurse Practitioners) who all work together to provide you with the care you need, when you need it.  Your next appointment:   2 month(s)  Provider:    You may see Redell Cave, MD or Bernardino Bring, PA-C

## 2024-07-23 ENCOUNTER — Ambulatory Visit: Admitting: Nurse Practitioner

## 2024-08-01 ENCOUNTER — Ambulatory Visit: Attending: Physician Assistant

## 2024-08-01 DIAGNOSIS — R0602 Shortness of breath: Secondary | ICD-10-CM | POA: Insufficient documentation

## 2024-08-01 LAB — ECHOCARDIOGRAM COMPLETE
AR max vel: 2.23 cm2
AV Area VTI: 2.05 cm2
AV Area mean vel: 2.33 cm2
AV Mean grad: 5 mmHg
AV Peak grad: 8.9 mmHg
Ao pk vel: 1.49 m/s
Area-P 1/2: 4.46 cm2
S' Lateral: 3 cm

## 2024-08-06 ENCOUNTER — Ambulatory Visit: Payer: Self-pay | Admitting: Physician Assistant

## 2024-08-06 DIAGNOSIS — I493 Ventricular premature depolarization: Secondary | ICD-10-CM

## 2024-08-16 DIAGNOSIS — I493 Ventricular premature depolarization: Secondary | ICD-10-CM | POA: Diagnosis not present

## 2024-08-26 ENCOUNTER — Ambulatory Visit: Admitting: Cardiology

## 2024-09-11 NOTE — Progress Notes (Unsigned)
 Cardiology Office Note    Date:  09/13/2024   ID:  Nancy Blackburn, Nancy Blackburn 09/05/1946, MRN [id number]  PCP:  Bertrum Charlie CROME, MD  Cardiologist:  Redell Cave, MD  Electrophysiologist:  None   Chief Complaint: Follow-up  History of Present Illness:   Nancy Blackburn is a 78 y.o. female with history of CAD medically managed, frequent PVCs, symptomatic bradycardia with associated dizziness and syncope in 2021 on beta-blocker, Parkinson's disease with mild cognitive impairment, upper GI bleed secondary to PUD from NSAIDs in 2014 requiring multiple units of PRBC, HTN, HLD, and asthma who presents for follow-up of Zio patch and echo.  Remote echo from 2016 showed an EF of 60 to 65%, mild LVH, no regional wall motion abnormalities, grade 2 diastolic dysfunction, mildly dilated left atrium, and trivial pericardial effusion.  Lexiscan  MPI in 04/2015 was low risk, without evidence of ischemia, and with normal LVEF.  She was admitted to the hospital in 06/2020 with dizziness, presyncope, and bradycardia with heart rate in the 30s bpm.  Propranolol  and Aricept  were discontinued.  Echo showed an EF of 60 to 65%, no regional wall motion abnormalities, mild LVH, grade 1 diastolic dysfunction, normal RV systolic function and ventricular cavity size, normal RVSP, trivial mitral regurgitation.  Outpatient cardiac monitoring showed sinus rhythm with frequent PVCs with an overall burden of 25%.  Plan was to avoid beta-blockers despite frequent PVCs due to history of dizziness and syncope with beta-blocker use; avoiding amiodarone for similar reasons.  With discontinuation of beta-blocker, heart rate improved.  In 09/2021, she reported increased shortness of breath following surgery.  CT of the chest was negative for PE.  Echo in 09/2021 showed an EF of 60 to 65%, no regional wall motion abnormalities, mild concentric LVH of the basal septal segment, grade 1 diastolic dysfunction, no significant LVOT  gradient measured, normal RV systolic function, cavity size, and RVSP, small pericardial effusion, mild mitral regurgitation, mild aortic valve sclerosis without evidence of stenosis, and an estimated right atrial pressure of 3 mmHg.  She was seen in 01/2022 reporting occasional shortness of breath with Lexiscan  MPI in 01/2022 showing no evidence of ischemia or scar with an EF of greater than 65% and was overall low risk.  Coronary calcification was noted near the ostium of the RCA.  Also incidentally noted was aortic atherosclerosis and a small pericardial effusion.    She was seen in the office in 05/2023 and was without symptoms of angina or cardiac decompensation.  She reported having had a syncopal event in the setting of UTI (the second time this happened).  No changes or further testing were pursued at that time.  She was last seen in the office in 06/2024 and reported being told she was in atrial fibrillation prior to her colonoscopy.  There was no objective evidence available for review confirming this.  Asthma-related dyspnea, was stable.  Zio patch in 06/2024 showed a predominant rhythm of sinus with an average rate of 78 bpm with first-degree AV block and underlying BBB/IVCD, 10 episodes of SVT lasting up to 10 beats, rare atrial ectopy, and frequent PVCs with an overall burden of 22.1%.  Echo in 07/2024 showed an EF of 60 to 65%, no regional wall motion abnormalities, grade 1 diastolic dysfunction, normal RV systolic function and ventricular cavity size, aortic valve sclerosis without evidence of stenosis, estimated right atrial pressure of 3 mmHg, and PVCs noted during the study.  She comes in today and is without symptoms  of angina or cardiac decompensation.  She does report ongoing exertional shortness of breath and fatigue without frank chest pain.  She has historically attributed her shortness of breath due to asthma, and has scheduled an appointment with her pulmonologist.  No lower extremity  swelling or progressive orthopnea.  No palpitations, dizziness, presyncope, or syncope.  No falls or symptoms concerning for bleeding.   Labs independently reviewed: 03/2024 - potassium 5.2, BUN 24, serum creatinine 1.1, albumin 3.6, AST/ALT normal, TC 185, TG 131, HDL 52, LDL 106, Hgb 14.1, PLT 215 11/2023 - TSH normal  Past Medical History:  Diagnosis Date   Adnexal cyst 10/18/2021   a.) CT abdomen: large cystic mass in the LEFT hemipelvis measuring 8.5 cm. b.) Pelvis US  10/22/2021: adnexal cyst measuring 8.0 x 8.2 x 7.9 (volume 270 cm). c.) Pelvis US  01/17/2022: cystic mass measured 8.2 x 8.0 x 8.3 (volume 290 cm).   Anxiety    a.) on BZO (lorazepam ) therapy PRN   Arthritis    Asthma    B12 deficiency    Benign essential tremor    Bleeding gastric ulcer    Bradycardia    Cervical cancer (HCC) 1978   Chronic sinus infection    Coronary artery disease    a.) CT chest 04/02/2021: coronary calcifications. b.) Lexi 01/21/2022: calcifications near [facility].   Depression    Diastolic dysfunction    a.) TTE 05/24/2012: EF 55-65%; triv MR, mild PR; G1DD. b.) TTE 05/24/2012: EF 55-65%; mild LVH; triv MR, mild TR/PR; G1DD. c.) TTE 04/15/2015: EF 60-65%; mild LVH; mild LA dilation; mild PR, triv TR; G2DD. d.) TTE 07/03/20 and 09/28/2021: EF 60-65%; mild LVH; G1DD.   Fracture, sacrum/coccyx (HCC)    GERD (gastroesophageal reflux disease)    Hiatal hernia    History of kidney stones    Hyperlipidemia    Hypertension    Insomnia    a.) takes trazodone  PRN   Low back pain    Mild dementia (HCC)    Nasal polyps    Normal nuclear stress test    a. 07/2005 Nl nuc stress test.   Osteopenia    Osteoporosis    Palpitations    a. June 2013:  Event recorder normal sinus rhythm with rare PVCs - ? symptomatic PVC's.   Parkinsonian features    a.) resting tremors; LEFT hemibody Parkinsonism. b.) takes primidone    Pericardial effusion    a.) TTE 05/24/2012: small. b.) TTE 04/15/2015: trivial. c.)  TTE 09/28/2021: small.   Pneumonia    PONV (postoperative nausea and vomiting)    Thyroid  nodule    Vitamin D  deficiency     Past Surgical History:  Procedure Laterality Date   BACK SURGERY  08/2010   BLADDER SUSPENSION  2003   bone spur  2009   removed from right collar bone area and partial collar bone removed   CATARACT EXTRACTION W/ INTRAOCULAR LENS  IMPLANT, BILATERAL     COLONOSCOPY N/A 06/20/2024   Procedure: COLONOSCOPY;  Surgeon: Onita Elspeth Sharper, DO;  Location: [facility] ENDOSCOPY;  Service: Gastroenterology;  Laterality: N/A;   ESOPHAGEAL DILATION N/A    EYE SURGERY     LUMBAR FUSION  2011, 2012   POLYPECTOMY  06/20/2024   Procedure: POLYPECTOMY, INTESTINE;  Surgeon: Onita Elspeth Sharper, DO;  Location: [facility] ENDOSCOPY;  Service: Gastroenterology;;   ROBOTIC ASSISTED SALPINGO OOPHERECTOMY Bilateral 03/02/2022   Procedure: XI ROBOTIC ASSISTED SALPINGO OOPHORECTOMY;  Surgeon: Victor Claudell SAUNDERS, MD;  Location: [facility] ORS;  Service: Gynecology;  Laterality: Bilateral;   SHOULDER ARTHROSCOPY Right 2012   THYROIDECTOMY, PARTIAL  1976   TONSILLECTOMY     as child   TOTAL KNEE ARTHROPLASTY Right 03/09/2021   Procedure: Right TOTAL KNEE ARTHROPLASTY;  Surgeon: Marchia Drivers, MD;  Location: [facility] ORS;  Service: Orthopedics;  Laterality: Right;   TUBAL LIGATION  1977   VAGINAL HYSTERECTOMY  1978   d/t cervical cancer    Current Medications: Current Meds  Medication Sig   acetaminophen  (TYLENOL ) 500 MG tablet Take 2 tablets (1,000 mg total) by mouth every 6 (six) hours.   albuterol  (VENTOLIN  HFA) 108 (90 Base) MCG/ACT inhaler Inhale 1-2 puffs into the lungs every 6 (six) hours as needed for wheezing or shortness of breath.   amLODipine  (NORVASC ) 5 MG tablet TAKE 1 TABLET EVERY DAY   Calcium  Carb-Cholecalciferol  (CALCIUM  600+D3) 600-800 MG-UNIT TABS Take 1 tablet by mouth daily.   cholecalciferol  (VITAMIN D ) 25 MCG tablet Take 1 tablet (1,000 Units total) by mouth daily.    citalopram  (CELEXA ) 40 MG tablet Take 40 mg by mouth daily.   LORazepam  (ATIVAN ) 0.5 MG tablet TAKE ONE TABLET (0.5 MG TOTAL) BY MOUTH DAILY AS NEEDED FOR ANXIETY.   Menthol , Topical Analgesic, (BIOFREEZE EX) Apply 1 application. topically daily as needed (pain).   metoprolol  tartrate (LOPRESSOR ) 50 MG tablet TAKE 1 TABLET 2 HR PRIOR TO CARDIAC PROCEDURE   primidone  (MYSOLINE ) 50 MG tablet Take 50 mg by mouth at bedtime.   rosuvastatin  (CRESTOR ) 40 MG tablet Take 1 tablet (40 mg total) by mouth at bedtime.   traZODone  (DESYREL ) 100 MG tablet Take 200 mg by mouth at bedtime as needed.   trimethoprim  (TRIMPEX ) 100 MG tablet Take 100 mg by mouth daily.   vitamin B-12 (CYANOCOBALAMIN ) 1000 MCG tablet Take 1,000 mcg by mouth daily.    Allergies:   Anti-inflammatory enzyme [nutritional supplements], Cymbalta [duloxetine hcl], Clarithromycin, Clarithromycin, Donepezil  hcl, Nsaids, Ranitidine, and Venlafaxine   Social History   Socioeconomic History   Marital status: Divorced    Spouse name: Not on file   Number of children: 2   Years of education: Not on file   Highest education level: 12th grade  Occupational History   Occupation: retired  Tobacco Use   Smoking status: Never   Smokeless tobacco: Never  Vaping Use   Vaping status: Never Used  Substance and Sexual Activity   Alcohol  use: Yes    Comment: 1 glass of wine or mixed drink every 2-3 months   Drug use: No   Sexual activity: Never  Other Topics Concern   Not on file  Social History Narrative   Grandson lives with her   Social Drivers of Health   Financial Resource Strain: Low Risk  (06/17/2024)   Overall Financial Resource Strain (CARDIA)    Difficulty of Paying Living Expenses: Not hard at all  Food Insecurity: No Food Insecurity (06/17/2024)   Hunger Vital Sign    Worried About Running Out of Food in the Last Year: Never true    Ran Out of Food in the Last Year: Never true  Transportation Needs: No Transportation Needs  (06/17/2024)   PRAPARE - Administrator, Civil Service (Medical): No    Lack of Transportation (Non-Medical): No  Physical Activity: Insufficiently Active (06/17/2024)   Exercise Vital Sign    Days of Exercise per Week: 2 days    Minutes of Exercise per Session: 40 min  Stress: No Stress Concern Present (06/17/2024)   [employer]  of Occupational Health - Occupational Stress Questionnaire    Feeling of Stress: Only a little  Social Connections: Moderately Integrated (06/17/2024)   Social Connection and Isolation Panel    Frequency of Communication with Friends and Family: Twice a week    Frequency of Social Gatherings with Friends and Family: Once a week    Attends Religious Services: More than 4 times per year    Active Member of [employer] or Organizations: Yes    Attends Engineer, structural: Not on file    Marital Status: Divorced     Family History:  The patient's family history includes Angina in her mother; Atrial fibrillation in her brother; Breast cancer in her cousin; COPD in her father; Cancer in her daughter; Colon cancer in her mother; Cushing syndrome in her daughter; Heart disease in her father and mother; Heart murmur in her mother. There is no history of Hypotension, Malignant hyperthermia, or Pseudochol deficiency.  ROS:   12-point review of systems is negative unless otherwise noted in the HPI.   EKGs/Labs/Other Studies Reviewed:    Studies reviewed were summarized above. The additional studies were reviewed today:  2D echo 08/01/2024: 1. Left ventricular ejection fraction, by estimation, is 60 to 65%. The  left ventricle has normal function. The left ventricle has no regional  wall motion abnormalities. Left ventricular diastolic parameters are  consistent with Grade I diastolic  dysfunction (impaired relaxation).   2. Right ventricular systolic function is normal. The right ventricular  size is normal. Tricuspid regurgitation signal is inadequate  for assessing  PA pressure.   3. The mitral valve is normal in structure. No evidence of mitral valve  regurgitation. No evidence of mitral stenosis.   4. The aortic valve is normal in structure. Aortic valve regurgitation is  not visualized. Aortic valve sclerosis is present, with no evidence of  aortic valve stenosis.   5. The inferior vena cava is normal in size with greater than 50%  respiratory variability, suggesting right atrial pressure of 3 mmHg.   6. PVCs noted  __________  Zio patch 06/2024: Patient had a min HR of 56 bpm, max HR of 176 bpm, and avg HR of 78 bpm. Predominant underlying rhythm was Sinus Rhythm. First Degree AV Block was present. Bundle Branch Block/IVCD was present. 10 Supraventricular Tachycardia runs occurred, the run with  the fastest interval lasting 4 beats with a max rate of 176 bpm, the longest lasting 10 beats with an avg rate of 112 bpm. Isolated SVEs were rare (<1.0%), SVE Couplets were rare (<1.0%), and SVE Triplets were rare (<1.0%). Isolated VEs were frequent  (22.1%, [id number]), VE Couplets were rare (<1.0%, 271), and no VE Triplets were present. Ventricular Bigeminy and Trigeminy were present.   Conclusion Average heart rate 78, range 56-176 bpm. 10 episodes of nonsustained SVT, fastest interval 4 beats, max heart rate 176. Frequent PVCs, 22.1% burden. No atrial fibrillation or atrial flutter. No sustained arrhythmias. __________  Lexiscan  MPI 01/21/2022:   Normal pharmacologic myocardial perfusion stress test without evidence of significant ischemia or scar.   Left ventricular function is normal (LVEF > 65%).   Calcification near the ostium of the RCA as well as aortic atherosclerosis are noted.  There is also a small pericardial effusion.   This is a low risk study. __________   2D echo 09/28/2021: 1. Left ventricular ejection fraction, by estimation, is 60 to 65%. The  left ventricle has normal function. The left ventricle has no regional  wall motion abnormalities. There is mild concentric left ventricular  hypertrophy with moderaet thickening of  the basal-septal segment. Left ventricular diastolic parameters are  consistent with Grade I diastolic dysfunction (impaired relaxation). No  significant LVOT gradient measured.   2. Right ventricular systolic function is normal. The right ventricular  size is normal. There is normal pulmonary artery systolic pressure. The  estimated right ventricular systolic pressure is 32.5 mmHg.   3. A small pericardial effusion is present.   4. The mitral valve is normal in structure. Mild mitral valve  regurgitation. No evidence of mitral stenosis.   5. The aortic valve is normal in structure. Aortic valve regurgitation is  not visualized. Mild aortic valve sclerosis is present, with no evidence  of aortic valve stenosis.   6. The inferior vena cava is normal in size with greater than 50%  respiratory variability, suggesting right atrial pressure of 3 mmHg.   Comparison(s): LVEF 60-65%.  __________   Zio patch 08/2020: No significant arrhythmias noted. Patient triggered events were associated with ventricular ectopies. Isolated ventricular ectopies with frequent 25%. __________   2D echo 07/03/2020: 1. Left ventricular ejection fraction, by estimation, is 60 to 65%. The  left ventricle has normal function. The left ventricle has no regional  wall motion abnormalities. There is mild left ventricular hypertrophy.  Left ventricular diastolic parameters  are consistent with Grade I diastolic dysfunction (impaired relaxation).   2. Right ventricular systolic function is normal. The right ventricular  size is normal. There is normal pulmonary artery systolic pressure.   3. The mitral valve was not well visualized. Trivial mitral valve  regurgitation.   4. The aortic valve was not well visualized. Aortic valve regurgitation  is not visualized.  __________   Lexiscan  MPI 04/15/2015: Low  risk myocardial perfusion study with a small in size, mild in intensity fixed defect in the inferolateral wall consistent with breast attenuation artifact. No ischemia noted. Normal LVF.  __________   2D echo 04/15/2015: - Left ventricle: The cavity size was normal. Wall thickness was    increased in a pattern of mild LVH. Systolic function was normal.    The estimated ejection fraction was in the range of 60% to 65%.    Wall motion was normal; there were no regional wall motion    abnormalities. Features are consistent with a pseudonormal left    ventricular filling pattern, with concomitant abnormal relaxation    and increased filling pressure (grade 2 diastolic dysfunction).  - Left atrium: The atrium was mildly dilated.  - Pericardium, extracardiac: A trivial pericardial effusion was    identified.    EKG:  EKG is not ordered today.    Recent Labs: No results found for requested labs within last 365 days.  Recent Lipid Panel    Component Value Date/Time   CHOL 172 02/23/2022 0940   CHOL 280 (H) 01/18/2022 1552   TRIG 197 (H) 02/23/2022 0940   HDL 50 02/23/2022 0940   HDL 45 01/18/2022 1552   CHOLHDL 3.4 02/23/2022 0940   VLDL 39 02/23/2022 0940   LDLCALC 83 02/23/2022 0940   LDLCALC 183 (H) 01/18/2022 1552   LDLDIRECT 186 (H) 01/18/2022 1552    PHYSICAL EXAM:    VS:  BP (!) 140/82 (BP Location: Right Arm, Patient Position: Sitting, Cuff Size: Normal)   Pulse 70   Ht 5' 7 (1.702 m)   Wt 203 lb 9.6 oz (92.4 kg)   SpO2 91%   BMI 31.89 kg/m  BMI: Body mass index is 31.89 kg/m.  Physical Exam Vitals reviewed.  Constitutional:      Appearance: She is well-developed.  HENT:     Head: Normocephalic and atraumatic.  Eyes:     General:        Right eye: No discharge.        Left eye: No discharge.  Cardiovascular:     Rate and Rhythm: Normal rate and regular rhythm.     Pulses:          Posterior tibial pulses are 2+ on the right side and 2+ on the left side.      Heart sounds: Normal heart sounds, S1 normal and S2 normal. Heart sounds not distant. No midsystolic click and no opening snap. No murmur heard.    No friction rub.  Pulmonary:     Effort: Pulmonary effort is normal. No respiratory distress.     Breath sounds: Normal breath sounds. No decreased breath sounds, wheezing, rhonchi or rales.  Musculoskeletal:     Cervical back: Normal range of motion.     Right lower leg: No edema.     Left lower leg: No edema.  Skin:    General: Skin is warm and dry.     Nails: There is no clubbing.  Neurological:     Mental Status: She is alert and oriented to person, place, and time.  Psychiatric:        Speech: Speech normal.        Behavior: Behavior normal.        Thought Content: Thought content normal.        Judgment: Judgment normal.     Wt Readings from Last 3 Encounters:  09/13/24 203 lb 9.6 oz (92.4 kg)  07/11/24 199 lb 9.6 oz (90.5 kg)  06/20/24 191 lb 12.8 oz (87 kg)     ASSESSMENT & PLAN:   CAD involving the native coronary arteries with exertional dyspnea: She reports a long history of exertional dyspnea that has been attributed to asthma. Prior cardiac testing in 2023 showed no evidence of ischemia.  CT attenuation corrected imaging showed calcification of the ostial RCA.  With frequent PVCs again noted, pursue coronary CTA.  This study will need to be done in [place] due to ventricular ectopy.  Echo with preserved LV systolic function.  Not currently on aspirin given history of upper GI bleed.  Continue rosuvastatin  40 mg.  Reported Afib: Patient reports she was told she was in A-fib prior to colonoscopy earlier this month.  EKG/tracing not available for review in Epic.  This rhythm is not mentioned in documentation supporting this procedure.  Subsequent Zio patch showed no evidence of A-fib with frequent PVCs.  Without objective evidence of A-fib, no indication for initiation of anticoagulation.  She will be evaluated by EP for  management of frequent PVCs, consider loop recorder.  Frequent PVCs: No symptoms of palpitations though query if her dyspnea is exacerbated by ventricular ectopy burden.  Prior cardiac monitoring in 2021, in the setting of symptomatic bradycardia with dizziness and near syncope occurring while on beta-blocker leading to its discontinuation showed frequent PVCs with an overall burden of 25%.  Repeat cardiac monitoring again demonstrated frequent PVCs with a burden of 22%.  Unable to use beta-blocker with symptomatic bradycardia and dizziness.  Echo demonstrated preserved LV systolic function.  Pursue ischemic evaluation as outlined above.  She has been referred to EP for ongoing management of PVC burden.  Symptomatic bradycardia: Occurred in  the setting of AV nodal blocking medication.  Pericardial effusion: Echo in 07/2024 showed no evidence of pericardial effusion.  Hemodynamically stable.  History of syncope: Occurred in 2024 and felt to be in the setting of UTI.  No further episodes.  Evaluation as outlined above.  HTN: Blood pressure is reasonably controlled in the office today.  She remains on amlodipine  5 mg.  HLD: LDL 106 in 03/2024 with target LDL less than 70.  Remains on rosuvastatin  40 mg.  Following coronary CTA, anticipate addition of Zetia 10 mg.     Disposition: F/u with Dr. Darliss or an APP in 2 months.   Medication Adjustments/Labs and Tests Ordered: Current medicines are reviewed at length with the patient today.  Concerns regarding medicines are outlined above. Medication changes, Labs and Tests ordered today are summarized above and listed in the Patient Instructions accessible in Encounters.   Signed, Bernardino Bring, PA-C 09/13/2024 5:11 PM     [facility] [place] [address] [place], [place] [address] [phone]

## 2024-09-13 ENCOUNTER — Ambulatory Visit: Attending: Physician Assistant | Admitting: Physician Assistant

## 2024-09-13 VITALS — BP 140/82 | HR 70 | Ht 67.0 in | Wt 203.6 lb

## 2024-09-13 DIAGNOSIS — R001 Bradycardia, unspecified: Secondary | ICD-10-CM | POA: Insufficient documentation

## 2024-09-13 DIAGNOSIS — E785 Hyperlipidemia, unspecified: Secondary | ICD-10-CM | POA: Diagnosis present

## 2024-09-13 DIAGNOSIS — I10 Essential (primary) hypertension: Secondary | ICD-10-CM | POA: Insufficient documentation

## 2024-09-13 DIAGNOSIS — I2510 Atherosclerotic heart disease of native coronary artery without angina pectoris: Secondary | ICD-10-CM | POA: Insufficient documentation

## 2024-09-13 DIAGNOSIS — Z87898 Personal history of other specified conditions: Secondary | ICD-10-CM | POA: Insufficient documentation

## 2024-09-13 DIAGNOSIS — I3139 Other pericardial effusion (noninflammatory): Secondary | ICD-10-CM | POA: Diagnosis present

## 2024-09-13 DIAGNOSIS — I493 Ventricular premature depolarization: Secondary | ICD-10-CM | POA: Diagnosis not present

## 2024-09-13 MED ORDER — METOPROLOL TARTRATE 50 MG PO TABS: ORAL_TABLET | ORAL | 0 refills | Status: DC

## 2024-09-13 NOTE — Patient Instructions (Signed)
 Medication Instructions:  Your physician recommends that you continue on your current medications as directed. Please refer to the Current Medication list given to you today.   *If you need a refill on your cardiac medications before your next appointment, please call your pharmacy*  Lab Work: Your provider would like for you to have following labs drawn today BMeT.   If you have labs (blood work) drawn today and your tests are completely normal, you will receive your results only by: MyChart Message (if you have MyChart) OR A paper copy in the mail If you have any lab test that is abnormal or we need to change your treatment, we will call you to review the results.  Testing/Procedures:   Your cardiac CT will be scheduled at one of the below locations:   [facility] [address] [place], [place] [address] [phone] (Severe contrast allergies only)  OR   [facility] [address] [place], [place] [address] [phone]  OR   [facility] [address]  [place], [place] [address] [phone]  OR   [facility] [address] [place], [place] [phone]  If scheduled at [facility], please arrive at the [facility] and Children's Entrance (Entrance C2) of [facility] 30 minutes prior to test start time. You can use the FREE valet parking offered at entrance C (encouraged to control the heart rate for the test)  Proceed to the [facility] Radiology Department (first floor) to check-in and test prep.  All radiology patients and guests should use entrance C2 at [facility], accessed from [facility], even though the hospital's physical address listed is [address]  If scheduled at the Heart and Vascular Tower at [address], please enter the parking lot using the [address] entrance and use the FREE valet service at the patient drop-off area. Enter  the building and check-in with registration on the main floor.  If scheduled at [facility], please arrive 30 minutes early for check-in and test prep.  Please follow these instructions carefully (unless otherwise directed):  An IV will be required for this test and Nitroglycerin will be given.   On the Night Before the Test: Be sure to Drink plenty of water. Do not consume any caffeinated/decaffeinated beverages or chocolate 12 hours prior to your test. Do not take any antihistamines 12 hours prior to your test.  On the Day of the Test: Drink plenty of water until 1 hour prior to the test. Do not eat any food 1 hour prior to test. You may take your regular medications prior to the test.  Take metoprolol  (Lopressor ) 50 mg tablet two hours prior to test. If you take Furosemide/Hydrochlorothiazide /Spironolactone/Chlorthalidone, please HOLD on the morning of the test. Patients who wear a continuous glucose monitor MUST remove the device prior to scanning. FEMALES- please wear underwire-free bra if available, avoid dresses & tight clothing  After the Test: Drink plenty of water. After receiving IV contrast, you may experience a mild flushed feeling. This is normal. On occasion, you may experience a mild rash up to 24 hours after the test. This is not dangerous. If this occurs, you can take Benadryl  25 mg, Zyrtec, Claritin , or Allegra and increase your fluid intake. (Patients taking Tikosyn should avoid Benadryl , and may take Zyrtec, Claritin , or Allegra) If you experience trouble breathing, this can be serious. If it is severe call 911 IMMEDIATELY.  If it is mild, please call our office.  We will call to schedule your test 2-4 weeks out understanding that some insurance companies will need an authorization prior to the service being performed.   For more information and frequently asked questions, please visit our website : [email or web address]  For non-scheduling  related questions, please contact the cardiac imaging nurse navigator should you have any questions/concerns: Cardiac Imaging Nurse Navigators Direct Office Dial: [phone]   For scheduling needs, including cancellations and rescheduling, please call [place], [phone].   Follow-Up: At [facility], you and your health needs are our priority.  As part of our continuing mission to provide you with exceptional heart care, our providers are all part of one team.  This team includes your primary Cardiologist (physician) and Advanced Practice Providers or APPs (Physician Assistants and Nurse Practitioners) who all work together to provide you with the care you need, when you need it.  Your next appointment:   2 month(s)  Provider:   You may see Redell Cave, MD or Bernardino Bring, PA-C  We recommend signing up for the patient portal called MyChart.  Sign up information is provided on this After Visit Summary.  MyChart is used to connect with patients for Virtual Visits (Telemedicine).  Patients are able to view lab/test results, encounter notes, upcoming appointments, etc.  Non-urgent messages can be sent to your provider as well.   To learn more about what you can do with MyChart, go to [email or web address].

## 2024-09-14 LAB — BASIC METABOLIC PANEL WITH GFR
BUN/Creatinine Ratio: 13 (ref 12–28)
BUN: 17 mg/dL (ref 8–27)
CO2: 23 mmol/L (ref 20–29)
Calcium: 9.8 mg/dL (ref 8.7–10.3)
Chloride: 100 mmol/L (ref 96–106)
Creatinine, Ser: 1.29 mg/dL — ABNORMAL HIGH (ref 0.57–1.00)
Glucose: 97 mg/dL (ref 70–99)
Potassium: 5.4 mmol/L — ABNORMAL HIGH (ref 3.5–5.2)
Sodium: 137 mmol/L (ref 134–144)
eGFR: 42 mL/min/1.73 — ABNORMAL LOW (ref >59)

## 2024-09-16 ENCOUNTER — Ambulatory Visit: Payer: Self-pay | Admitting: Physician Assistant

## 2024-09-16 DIAGNOSIS — Z79899 Other long term (current) drug therapy: Secondary | ICD-10-CM

## 2024-09-23 ENCOUNTER — Encounter (HOSPITAL_COMMUNITY): Payer: Self-pay

## 2024-09-23 ENCOUNTER — Other Ambulatory Visit
Admission: RE | Admit: 2024-09-23 | Discharge: 2024-09-23 | Disposition: A | Discharge status: Home / Self Care | Source: Ambulatory Visit | Attending: Physician Assistant | Admitting: Physician Assistant

## 2024-09-23 ENCOUNTER — Ambulatory Visit: Payer: Self-pay | Admitting: Physician Assistant

## 2024-09-23 DIAGNOSIS — Z79899 Other long term (current) drug therapy: Secondary | ICD-10-CM | POA: Diagnosis present

## 2024-09-23 LAB — POTASSIUM: Potassium: 3.9 mmol/L (ref 3.5–5.1)

## 2024-09-24 ENCOUNTER — Ambulatory Visit (HOSPITAL_COMMUNITY): Admission: RE | Admit: 2024-09-24 | Source: Ambulatory Visit

## 2024-09-24 ENCOUNTER — Ambulatory Visit (HOSPITAL_COMMUNITY)
Admission: RE | Admit: 2024-09-24 | Discharge: 2024-09-24 | Disposition: A | Discharge status: Home / Self Care | Source: Ambulatory Visit | Attending: Physician Assistant | Admitting: Physician Assistant

## 2024-09-24 ENCOUNTER — Encounter (HOSPITAL_COMMUNITY): Payer: Self-pay

## 2024-09-24 DIAGNOSIS — I493 Ventricular premature depolarization: Secondary | ICD-10-CM

## 2024-09-24 DIAGNOSIS — I2510 Atherosclerotic heart disease of native coronary artery without angina pectoris: Secondary | ICD-10-CM

## 2024-09-24 MED ORDER — NITROGLYCERIN 0.4 MG SL SUBL: 0.8000 mg | SUBLINGUAL_TABLET | Freq: Once | SUBLINGUAL | Status: DC

## 2024-09-24 MED ORDER — NITROGLYCERIN 0.4 MG SL SUBL
SUBLINGUAL_TABLET | SUBLINGUAL | Status: AC
  Filled 2024-09-24: qty 2

## 2024-09-24 NOTE — Progress Notes (Signed)
 Patient presents for cardiac CT.  Patient reports feeling weak, reports not eating much today and only drinking water.   BP 92/64 HR 39.  Patient alert and oriented.   Patient took 50mg  metoprolol  tartrate 2 hours prior to test.   PO caffeine provided to patient and Dr. Pietro made aware, advised to cancel CT.   Daughter with patient.    After PO caffeine BP 103/66 HR 39-40.  Precautions discussed with patient and daughter, verbalized understanding.  Patient alert and oriented, able to ambulate at discharge.

## 2024-10-01 ENCOUNTER — Encounter (HOSPITAL_COMMUNITY): Payer: Self-pay

## 2024-10-03 ENCOUNTER — Ambulatory Visit (HOSPITAL_COMMUNITY)
Admission: RE | Admit: 2024-10-03 | Discharge: 2024-10-03 | Disposition: A | Discharge status: Home / Self Care | Source: Ambulatory Visit | Attending: Physician Assistant | Admitting: Physician Assistant

## 2024-10-03 DIAGNOSIS — I493 Ventricular premature depolarization: Secondary | ICD-10-CM | POA: Diagnosis present

## 2024-10-03 DIAGNOSIS — I2510 Atherosclerotic heart disease of native coronary artery without angina pectoris: Secondary | ICD-10-CM | POA: Diagnosis present

## 2024-10-03 MED ORDER — DILTIAZEM HCL 25 MG/5ML IV SOLN: 10.0000 mg | INTRAVENOUS | Status: DC | PRN

## 2024-10-03 MED ORDER — IOHEXOL 350 MG/ML SOLN
100.0000 mL | Freq: Once | INTRAVENOUS | Status: AC | PRN
  Administered 2024-10-03: 100 mL via INTRAVENOUS

## 2024-10-03 MED ORDER — NITROGLYCERIN 0.4 MG SL SUBL
0.8000 mg | SUBLINGUAL_TABLET | Freq: Once | SUBLINGUAL | Status: AC
  Administered 2024-10-03: 0.8 mg via SUBLINGUAL

## 2024-10-10 ENCOUNTER — Other Ambulatory Visit: Payer: Self-pay | Admitting: Emergency Medicine

## 2024-10-10 DIAGNOSIS — Z79899 Other long term (current) drug therapy: Secondary | ICD-10-CM

## 2024-10-10 DIAGNOSIS — E785 Hyperlipidemia, unspecified: Secondary | ICD-10-CM

## 2024-10-10 DIAGNOSIS — I2510 Atherosclerotic heart disease of native coronary artery without angina pectoris: Secondary | ICD-10-CM

## 2024-10-10 MED ORDER — EZETIMIBE 10 MG PO TABS: 10.0000 mg | ORAL_TABLET | Freq: Every day | ORAL | 3 refills | Status: AC

## 2024-10-10 NOTE — Telephone Encounter (Signed)
 Daughter Donavon) returned RN's call regarding patient medication.

## 2024-10-23 ENCOUNTER — Institutional Professional Consult (permissible substitution): Admitting: Cardiology

## 2024-11-14 NOTE — Progress Notes (Deleted)
 "  Cardiology Office Note    Date:  11/14/2024   ID:  [name], Nancy Blackburn 08, 1947, MRN [id number]  PCP:  Bertrum Charlie CROME, MD  Cardiologist:  Redell Cave, MD  Electrophysiologist:  None   Chief Complaint: Follow up  History of Present Illness:   Nancy Blackburn is a 78 y.o. female with history of nonobstructive CAD by coronary CTA in 09/2024, frequent PVCs, symptomatic bradycardia with associated dizziness and syncope in 2021 on beta-blocker, Parkinson's disease with mild cognitive impairment, upper GI bleed secondary to PUD from NSAIDs in 2014 requiring multiple units of PRBC, HTN, HLD, and asthma who presents for follow-up of coronary CTA.   Remote echo from 2016 showed an EF of 60 to 65%, mild LVH, no regional wall motion abnormalities, grade 2 diastolic dysfunction, mildly dilated left atrium, and trivial pericardial effusion.  Lexiscan  MPI in 04/2015 was low risk, without evidence of ischemia, and with normal LVEF.  She was admitted to the hospital in 06/2020 with dizziness, presyncope, and bradycardia with heart rate in the 30s bpm.  Propranolol  and Aricept  were discontinued.  Echo showed an EF of 60 to 65%, no regional wall motion abnormalities, mild LVH, grade 1 diastolic dysfunction, normal RV systolic function and ventricular cavity size, normal RVSP, trivial mitral regurgitation.  Outpatient cardiac monitoring showed sinus rhythm with frequent PVCs with an overall burden of 25%.  Plan was to avoid beta-blockers despite frequent PVCs due to history of dizziness and syncope with beta-blocker use; avoiding amiodarone for similar reasons.  With discontinuation of beta-blocker, heart rate improved.  In 09/2021, she reported increased shortness of breath following surgery.  CT of the chest was negative for PE.  Echo in 09/2021 showed an EF of 60 to 65%, no regional wall motion abnormalities, mild concentric LVH of the basal septal segment, grade 1 diastolic dysfunction, no  significant LVOT gradient measured, normal RV systolic function, cavity size, and RVSP, small pericardial effusion, mild mitral regurgitation, mild aortic valve sclerosis without evidence of stenosis, and an estimated right atrial pressure of 3 mmHg.  She was seen in 01/2022 reporting occasional shortness of breath with Lexiscan  MPI in 01/2022 showing no evidence of ischemia or scar with an EF of greater than 65% and was overall low risk.  Coronary calcification was noted near the ostium of the RCA.  Also incidentally noted was aortic atherosclerosis and a small pericardial effusion.    She was seen in the office in 05/2023 and was without symptoms of angina or cardiac decompensation.  She reported having had a syncopal event in the setting of UTI (the second time this happened).  No changes or further testing were pursued at that time.  She was last seen in the office in 06/2024 and reported being told she was in atrial fibrillation prior to her colonoscopy.  There was no objective evidence available for review confirming this.  Asthma-related dyspnea, was stable.  Zio patch in 06/2024 showed a predominant rhythm of sinus with an average rate of 78 bpm with first-degree AV block and underlying BBB/IVCD, 10 episodes of SVT lasting up to 10 beats, rare atrial ectopy, and frequent PVCs with an overall burden of 22.1%.  Echo in 07/2024 showed an EF of 60 to 65%, no regional wall motion abnormalities, grade 1 diastolic dysfunction, normal RV systolic function and ventricular cavity size, aortic valve sclerosis without evidence of stenosis, estimated right atrial pressure of 3 mmHg, and PVCs noted during the study.  She was seen  in the office in 09/2024 noting ongoing exertional shortness of breath that she had historically attributed to asthma.  To further evaluate shortness of breath, she underwent coronary CTA in 09/2024 that showed a calcium  score of 49.4 which was a 42nd percentile with mild nonobstructive CAD in the  left main, LAD, and RCA all estimated at 1 to 24%.  Noncardiac overread demonstrated a small hiatal hernia and aortic atherosclerosis.  ***   Labs independently reviewed: 09/2024 - potassium 3.9, BUN 17, serum creatinine 1.29 03/2024 - albumin 3.6, AST/ALT normal, TC 185, TG 131, HDL 52, LDL 106, Hgb 14.1, PLT 215 11/2023 - TSH normal  Past Medical History:  Diagnosis Date   Adnexal cyst 10/18/2021   a.) CT abdomen: large cystic mass in the LEFT hemipelvis measuring 8.5 cm. b.) Pelvis US  10/22/2021: adnexal cyst measuring 8.0 x 8.2 x 7.9 (volume 270 cm). c.) Pelvis US  01/17/2022: cystic mass measured 8.2 x 8.0 x 8.3 (volume 290 cm).   Anxiety    a.) on BZO (lorazepam ) therapy PRN   Arthritis    Asthma    B12 deficiency    Benign essential tremor    Bleeding gastric ulcer    Bradycardia    Cervical cancer (HCC) 1978   Chronic sinus infection    Coronary artery disease    a.) CT chest 04/02/2021: coronary calcifications. b.) Lexi 01/21/2022: calcifications near [facility].   Depression    Diastolic dysfunction    a.) TTE 05/24/2012: EF 55-65%; triv MR, mild PR; G1DD. b.) TTE 05/24/2012: EF 55-65%; mild LVH; triv MR, mild TR/PR; G1DD. c.) TTE 04/15/2015: EF 60-65%; mild LVH; mild LA dilation; mild PR, triv TR; G2DD. d.) TTE 07/03/20 and 09/28/2021: EF 60-65%; mild LVH; G1DD.   Fracture, sacrum/coccyx (HCC)    GERD (gastroesophageal reflux disease)    Hiatal hernia    History of kidney stones    Hyperlipidemia    Hypertension    Insomnia    a.) takes trazodone  PRN   Low back pain    Mild dementia (HCC)    Nasal polyps    Normal nuclear stress test    a. 07/2005 Nl nuc stress test.   Osteopenia    Osteoporosis    Palpitations    a. June 2013:  Event recorder normal sinus rhythm with rare PVCs - ? symptomatic PVC's.   Parkinsonian features    a.) resting tremors; LEFT hemibody Parkinsonism. b.) takes primidone    Pericardial effusion    a.) TTE 05/24/2012: small. b.) TTE  04/15/2015: trivial. c.) TTE 09/28/2021: small.   Pneumonia    PONV (postoperative nausea and vomiting)    Thyroid  nodule    Vitamin D  deficiency     Past Surgical History:  Procedure Laterality Date   BACK SURGERY  08/2010   BLADDER SUSPENSION  2003   bone spur  2009   removed from right collar bone area and partial collar bone removed   CATARACT EXTRACTION W/ INTRAOCULAR LENS  IMPLANT, BILATERAL     COLONOSCOPY N/A 06/20/2024   Procedure: COLONOSCOPY;  Surgeon: Onita Elspeth Sharper, DO;  Location: [facility] ENDOSCOPY;  Service: Gastroenterology;  Laterality: N/A;   ESOPHAGEAL DILATION N/A    EYE SURGERY     LUMBAR FUSION  2011, 2012   POLYPECTOMY  06/20/2024   Procedure: POLYPECTOMY, INTESTINE;  Surgeon: Onita Elspeth Sharper, DO;  Location: [facility] ENDOSCOPY;  Service: Gastroenterology;;   ROBOTIC ASSISTED SALPINGO OOPHERECTOMY Bilateral 03/02/2022   Procedure: XI ROBOTIC ASSISTED SALPINGO OOPHORECTOMY;  Surgeon:  Victor Claudell SAUNDERS, MD;  Location: [facility] ORS;  Service: Gynecology;  Laterality: Bilateral;   SHOULDER ARTHROSCOPY Right 2012   THYROIDECTOMY, PARTIAL  1976   TONSILLECTOMY     as child   TOTAL KNEE ARTHROPLASTY Right 03/09/2021   Procedure: Right TOTAL KNEE ARTHROPLASTY;  Surgeon: Marchia Drivers, MD;  Location: [facility] ORS;  Service: Orthopedics;  Laterality: Right;   TUBAL LIGATION  1977   VAGINAL HYSTERECTOMY  1978   d/t cervical cancer    Current Medications: No outpatient medications have been marked as taking for the 11/15/24 encounter (Appointment) with Abigail Bernardino HERO, PA-C.    Allergies:   Anti-inflammatory enzyme [nutritional supplements], Cymbalta [duloxetine hcl], Clarithromycin, Clarithromycin, Donepezil  hcl, Nsaids, Ranitidine, and Venlafaxine   Social History   Socioeconomic History   Marital status: Divorced    Spouse name: Not on file   Number of children: 2   Years of education: Not on file   Highest education level: 12th grade  Occupational History    Occupation: retired  Tobacco Use   Smoking status: Never   Smokeless tobacco: Never  Vaping Use   Vaping status: Never Used  Substance and Sexual Activity   Alcohol  use: Yes    Comment: 1 glass of wine or mixed drink every 2-3 months   Drug use: No   Sexual activity: Never  Other Topics Concern   Not on file  Social History Narrative   Grandson lives with her   Social Drivers of Health   Financial Resource Strain: Low Risk  (06/17/2024)   Overall Financial Resource Strain (CARDIA)    Difficulty of Paying Living Expenses: Not hard at all  Food Insecurity: No Food Insecurity (06/17/2024)   Hunger Vital Sign    Worried About Running Out of Food in the Last Year: Never true    Ran Out of Food in the Last Year: Never true  Transportation Needs: No Transportation Needs (06/17/2024)   PRAPARE - Administrator, Civil Service (Medical): No    Lack of Transportation (Non-Medical): No  Physical Activity: Insufficiently Active (06/17/2024)   Exercise Vital Sign    Days of Exercise per Week: 2 days    Minutes of Exercise per Session: 40 min  Stress: No Stress Concern Present (06/17/2024)   [employer] of Occupational Health - Occupational Stress Questionnaire    Feeling of Stress: Only a little  Social Connections: Moderately Integrated (06/17/2024)   Social Connection and Isolation Panel    Frequency of Communication with Friends and Family: Twice a week    Frequency of Social Gatherings with Friends and Family: Once a week    Attends Religious Services: More than 4 times per year    Active Member of [employer] or Organizations: Yes    Attends Engineer, Structural: Not on file    Marital Status: Divorced     Family History:  The patient's family history includes Angina in her mother; Atrial fibrillation in her brother; Breast cancer in her cousin; COPD in her father; Cancer in her daughter; Colon cancer in her mother; Cushing syndrome in her daughter; Heart disease in  her father and mother; Heart murmur in her mother. There is no history of Hypotension, Malignant hyperthermia, or Pseudochol deficiency.  ROS:   12-point review of systems is negative unless otherwise noted in the HPI.   EKGs/Labs/Other Studies Reviewed:    Studies reviewed were summarized above. The additional studies were reviewed today:  Coronary CTA 10/03/2024: Normal caliber aortic root  and ascending aorta. No LA appendage thrombus. Pulmonary veins drain normally to the left atrium.   Calcium  Score: 49.4 AU   Coronary Arteries: Right dominant with no anomalies   LM: Calcified plaque ostial left main, mild (1-24%) stenosis.   LAD system: Mixed plaque proximal LAD, mild (1-24%) stenosis.   Circumflex system: No plaque or stenosis.   RCA system: Calcified plaque at RCA ostium, mild (1-24%) stenosis.   IMPRESSION: 1. Coronary artery calcium  score 49.4 AU. This places the patient in the 42nd percentile for age and gender, suggesting low-intermediate risk for cardiac events. 2.  Mild nonobstructive CAD. __________  2D echo 08/01/2024: 1. Left ventricular ejection fraction, by estimation, is 60 to 65%. The  left ventricle has normal function. The left ventricle has no regional  wall motion abnormalities. Left ventricular diastolic parameters are  consistent with Grade I diastolic  dysfunction (impaired relaxation).   2. Right ventricular systolic function is normal. The right ventricular  size is normal. Tricuspid regurgitation signal is inadequate for assessing  PA pressure.   3. The mitral valve is normal in structure. No evidence of mitral valve  regurgitation. No evidence of mitral stenosis.   4. The aortic valve is normal in structure. Aortic valve regurgitation is  not visualized. Aortic valve sclerosis is present, with no evidence of  aortic valve stenosis.   5. The inferior vena cava is normal in size with greater than 50%  respiratory variability, suggesting  right atrial pressure of 3 mmHg.   6. PVCs noted  __________   Zio patch 06/2024: Patient had a min HR of 56 bpm, max HR of 176 bpm, and avg HR of 78 bpm. Predominant underlying rhythm was Sinus Rhythm. First Degree AV Block was present. Bundle Branch Block/IVCD was present. 10 Supraventricular Tachycardia runs occurred, the run with  the fastest interval lasting 4 beats with a max rate of 176 bpm, the longest lasting 10 beats with an avg rate of 112 bpm. Isolated SVEs were rare (<1.0%), SVE Couplets were rare (<1.0%), and SVE Triplets were rare (<1.0%). Isolated VEs were frequent  (22.1%, [id number]), VE Couplets were rare (<1.0%, 271), and no VE Triplets were present. Ventricular Bigeminy and Trigeminy were present.   Conclusion Average heart rate 78, range 56-176 bpm. 10 episodes of nonsustained SVT, fastest interval 4 beats, max heart rate 176. Frequent PVCs, 22.1% burden. No atrial fibrillation or atrial flutter. No sustained arrhythmias. __________   Lexiscan  MPI 01/21/2022:   Normal pharmacologic myocardial perfusion stress test without evidence of significant ischemia or scar.   Left ventricular function is normal (LVEF > 65%).   Calcification near the ostium of the RCA as well as aortic atherosclerosis are noted.  There is also a small pericardial effusion.   This is a low risk study. __________   2D echo 09/28/2021: 1. Left ventricular ejection fraction, by estimation, is 60 to 65%. The  left ventricle has normal function. The left ventricle has no regional  wall motion abnormalities. There is mild concentric left ventricular  hypertrophy with moderaet thickening of  the basal-septal segment. Left ventricular diastolic parameters are  consistent with Grade I diastolic dysfunction (impaired relaxation). No  significant LVOT gradient measured.   2. Right ventricular systolic function is normal. The right ventricular  size is normal. There is normal pulmonary artery systolic  pressure. The  estimated right ventricular systolic pressure is 32.5 mmHg.   3. A small pericardial effusion is present.   4. The mitral valve is normal  in structure. Mild mitral valve  regurgitation. No evidence of mitral stenosis.   5. The aortic valve is normal in structure. Aortic valve regurgitation is  not visualized. Mild aortic valve sclerosis is present, with no evidence  of aortic valve stenosis.   6. The inferior vena cava is normal in size with greater than 50%  respiratory variability, suggesting right atrial pressure of 3 mmHg.   Comparison(s): LVEF 60-65%.  __________   Zio patch 08/2020: No significant arrhythmias noted. Patient triggered events were associated with ventricular ectopies. Isolated ventricular ectopies with frequent 25%. __________   2D echo 07/03/2020: 1. Left ventricular ejection fraction, by estimation, is 60 to 65%. The  left ventricle has normal function. The left ventricle has no regional  wall motion abnormalities. There is mild left ventricular hypertrophy.  Left ventricular diastolic parameters  are consistent with Grade I diastolic dysfunction (impaired relaxation).   2. Right ventricular systolic function is normal. The right ventricular  size is normal. There is normal pulmonary artery systolic pressure.   3. The mitral valve was not well visualized. Trivial mitral valve  regurgitation.   4. The aortic valve was not well visualized. Aortic valve regurgitation  is not visualized.  __________   Lexiscan  MPI 04/15/2015: Low risk myocardial perfusion study with a small in size, mild in intensity fixed defect in the inferolateral wall consistent with breast attenuation artifact. No ischemia noted. Normal LVF.  __________   2D echo 04/15/2015: - Left ventricle: The cavity size was normal. Wall thickness was    increased in a pattern of mild LVH. Systolic function was normal.    The estimated ejection fraction was in the range of 60% to 65%.     Wall motion was normal; there were no regional wall motion    abnormalities. Features are consistent with a pseudonormal left    ventricular filling pattern, with concomitant abnormal relaxation    and increased filling pressure (grade 2 diastolic dysfunction).  - Left atrium: The atrium was mildly dilated.  - Pericardium, extracardiac: A trivial pericardial effusion was    identified.    EKG:  EKG is ordered today.  The EKG ordered today demonstrates ***  Recent Labs: 09/13/2024: BUN 17; Creatinine, Ser 1.29; Sodium 137 09/23/2024: Potassium 3.9  Recent Lipid Panel    Component Value Date/Time   CHOL 172 02/23/2022 0940   CHOL 280 (H) 01/18/2022 1552   TRIG 197 (H) 02/23/2022 0940   HDL 50 02/23/2022 0940   HDL 45 01/18/2022 1552   CHOLHDL 3.4 02/23/2022 0940   VLDL 39 02/23/2022 0940   LDLCALC 83 02/23/2022 0940   LDLCALC 183 (H) 01/18/2022 1552   LDLDIRECT 186 (H) 01/18/2022 1552    PHYSICAL EXAM:    VS:  There were no vitals taken for this visit.  BMI: There is no height or weight on file to calculate BMI.  Physical Exam  Wt Readings from Last 3 Encounters:  09/13/24 203 lb 9.6 oz (92.4 kg)  07/11/24 199 lb 9.6 oz (90.5 kg)  06/20/24 191 lb 12.8 oz (87 kg)     ASSESSMENT & PLAN:   Nonobstructive CAD:  Reported Afib:  Frequent PVCs:  Symptomatic bradycardia:  Pericardial effusion:  History of syncope:  HTN: Blood pressure  HLD: LDL 106 in 03/2024.    {Are you ordering a CV Procedure (e.g. stress test, cath, DCCV, TEE, etc)?   Press F2        :[id number]}     Disposition: F/u with  Dr. Darliss or an APP in ***.   Medication Adjustments/Labs and Tests Ordered: Current medicines are reviewed at length with the patient today.  Concerns regarding medicines are outlined above. Medication changes, Labs and Tests ordered today are summarized above and listed in the Patient Instructions accessible in Encounters.   Signed, Bernardino Bring, PA-C 11/14/2024  4:31 PM     [facility] [address] [place], [place] [address] [phone] "

## 2024-11-15 ENCOUNTER — Ambulatory Visit: Admitting: Physician Assistant

## 2024-11-19 ENCOUNTER — Ambulatory Visit: Admitting: Cardiology

## 2024-11-21 ENCOUNTER — Encounter: Payer: Self-pay | Admitting: Pulmonary Disease

## 2024-11-21 ENCOUNTER — Ambulatory Visit: Admitting: Pulmonary Disease

## 2024-11-21 VITALS — BP 136/60 | HR 47 | Temp 97.9°F | Ht 67.0 in | Wt 208.8 lb

## 2024-11-21 DIAGNOSIS — Z9189 Other specified personal risk factors, not elsewhere classified: Secondary | ICD-10-CM

## 2024-11-21 DIAGNOSIS — R0602 Shortness of breath: Secondary | ICD-10-CM

## 2024-11-21 DIAGNOSIS — J4530 Mild persistent asthma, uncomplicated: Secondary | ICD-10-CM

## 2024-11-21 DIAGNOSIS — J986 Disorders of diaphragm: Secondary | ICD-10-CM

## 2024-11-21 DIAGNOSIS — R0683 Snoring: Secondary | ICD-10-CM | POA: Diagnosis not present

## 2024-11-21 LAB — NITRIC OXIDE: Nitric Oxide: 9

## 2024-11-21 NOTE — Progress Notes (Unsigned)
 Subjective:    Patient ID: Nancy Blackburn, female    DOB: [birth year], 78 y.o.   MRN: [id number]  Patient Care Team: Bertrum Charlie CROME, MD as PCP - General (Family Medicine) Darliss Rogue, MD as PCP - Cardiology (Cardiology) Kennyth Chew, MD as PCP - Electrophysiology (Cardiology) Vivienne Delon HERO, PA-C as Physician Assistant (Family Medicine) Jaye Fallow, MD as Referring Physician (Ophthalmology) Maree Jannett POUR, MD as Consulting Physician (Neurology) Gaston Hamilton, MD as Consulting Physician (Urology) Mavis Purchase, MD as Consulting Physician (Neurosurgery) Chipper Viviane POUR, MD as Referring Physician (Psychiatry) Luis Purchase, MD as Consulting Physician (Gastroenterology) Maurie Rayfield BIRCH, RN as Oncology Nurse Navigator  Chief Complaint  Patient presents with   Shortness of Breath    Shortness of breath on exertion x several months.     BACKGROUND:   HPI    Review of Systems A 10 point review of systems was performed and it is as noted above otherwise negative.   Past Medical History:  Diagnosis Date   Adnexal cyst 10/18/2021   a.) CT abdomen: large cystic mass in the LEFT hemipelvis measuring 8.5 cm. b.) Pelvis US  10/22/2021: adnexal cyst measuring 8.0 x 8.2 x 7.9 (volume 270 cm). c.) Pelvis US  01/17/2022: cystic mass measured 8.2 x 8.0 x 8.3 (volume 290 cm).   Anxiety    a.) on BZO (lorazepam ) therapy PRN   Arthritis    Asthma    B12 deficiency    Benign essential tremor    Bleeding gastric ulcer    Bradycardia    Cervical cancer (HCC) 1978   Chronic sinus infection    Coronary artery disease    a.) CT chest 04/02/2021: coronary calcifications. b.) Lexi 01/21/2022: calcifications near [facility].   Depression    Diastolic dysfunction    a.) TTE 05/24/2012: EF 55-65%; triv MR, mild PR; G1DD. b.) TTE 05/24/2012: EF 55-65%; mild LVH; triv MR, mild TR/PR; G1DD. c.) TTE 04/15/2015: EF 60-65%; mild LVH; mild LA dilation;  mild PR, triv TR; G2DD. d.) TTE 07/03/20 and 09/28/2021: EF 60-65%; mild LVH; G1DD.   Fracture, sacrum/coccyx (HCC)    GERD (gastroesophageal reflux disease)    Hiatal hernia    History of kidney stones    Hyperlipidemia    Hypertension    Insomnia    a.) takes trazodone  PRN   Low back pain    Mild dementia (HCC)    Nasal polyps    Normal nuclear stress test    a. 07/2005 Nl nuc stress test.   Osteopenia    Osteoporosis    Palpitations    a. June 2013:  Event recorder normal sinus rhythm with rare PVCs - ? symptomatic PVC's.   Parkinsonian features    a.) resting tremors; LEFT hemibody Parkinsonism. b.) takes primidone    Pericardial effusion    a.) TTE 05/24/2012: small. b.) TTE 04/15/2015: trivial. c.) TTE 09/28/2021: small.   Pneumonia    PONV (postoperative nausea and vomiting)    Thyroid  nodule    Vitamin D  deficiency     Past Surgical History:  Procedure Laterality Date   BACK SURGERY  08/2010   BLADDER SUSPENSION  2003   bone spur  2009   removed from right collar bone area and partial collar bone removed   CATARACT EXTRACTION W/ INTRAOCULAR LENS  IMPLANT, BILATERAL     COLONOSCOPY N/A 06/20/2024   Procedure: COLONOSCOPY;  Surgeon: Onita Elspeth Sharper, DO;  [facility] END[facility]COPY;  Service: Gastroenterology;  Laterality: N/A;   ESOPHAGEAL  DILATION N/A    EYE SURGERY     LUMBAR FUSION  2011, 2012   POLYPECTOMY  06/20/2024   Procedure: POLYPECTOMY, INTESTINE;  Surgeon: Onita Elspeth Sharper, DO;  Location: [facility] ENDOSCOPY;  Service: Gastroenterology;;   ROBOTIC ASSISTED SALPINGO OOPHERECTOMY Bilateral 03/02/2022   Procedure: XI ROBOTIC ASSISTED SALPINGO OOPHORECTOMY;  Surgeon: Victor Claudell SAUNDERS, MD;  Location: [facility] ORS;  Service: Gynecology;  Laterality: Bilateral;   SHOULDER ARTHROSCOPY Right 2012   THYROIDECTOMY, PARTIAL  1976   TONSILLECTOMY     as child   TOTAL KNEE ARTHROPLASTY Right 03/09/2021   Procedure: Right TOTAL  KNEE ARTHROPLASTY;  Surgeon: Marchia Drivers, MD;  Location: [facility] ORS;  Service: Orthopedics;  Laterality: Right;   TUBAL LIGATION  1977   VAGINAL HYSTERECTOMY  1978   d/t cervical cancer    Patient Active Problem List   Diagnosis Date Noted   Right ovarian cyst 03/02/2022   Cyst of right ovary    Cyst of left ovary    Acute pain of right knee    Elevated LFTs    Acute metabolic encephalopathy    Sepsis due to Proteus species (HCC) 10/18/2021   Rhabdomyolysis 10/18/2021   Acute kidney injury 10/18/2021   Leukocytosis 10/18/2021   Asthma 07/02/2021   S/P TKR (total knee replacement) using cement, right 03/09/2021   Bradycardia 07/03/2020   Postural dizziness with presyncope 07/01/2020   Family history of colon cancer in mother 02/22/2018   Cervical spondylosis 05/04/2017   Cervical radiculopathy 05/04/2017   Family history of Cushing disease 01/12/2017   B12 deficiency 12/18/2016   Mild cognitive impairment with memory loss 12/18/2016   Parkinsonian features 12/18/2016   Vitamin D  deficiency 12/18/2016   Chalazion, bilateral 06/08/2016   Absolute anemia 07/30/2015   Edema of extremities 07/30/2015   Benign essential tremor 07/30/2015   Anxiety, generalized 07/30/2015   Gastro-esophageal reflux disease without esophagitis 07/30/2015   HLD (hyperlipidemia) 07/30/2015   Cannot sleep 07/30/2015   Lumbar radiculopathy 07/30/2015   Depression, major, recurrent, in partial remission (HCC) 07/30/2015   Arthritis, degenerative 07/30/2015   Allergic rhinitis 07/30/2015   Gastroduodenal ulcer 07/30/2015   Calcium  blood increased 07/30/2015   Pre-syncope 05/17/2015   Dyspnea 03/31/2015   Bleeding stomach ulcer 10/14/2013   Anemia, iron deficiency 10/14/2013   Weakness 10/14/2013   Ejection fraction    Pericardial effusion    Palpitations    Low back pain    Normal nuclear stress test     Family History  Problem Relation Age  of Onset   Colon cancer Mother    Heart murmur Mother    Heart disease Mother    Angina Mother    Heart disease Father    COPD Father    Atrial fibrillation Brother    Cushing syndrome Daughter    Cancer Daughter        thyroid    Breast cancer Cousin    Hypotension Neg Hx    Malignant hyperthermia Neg Hx    Pseudochol deficiency Neg Hx     Social History   Tobacco Use   Smoking status: Never   Smokeless tobacco: Never  Substance Use Topics   Alcohol  use: Yes    Comment: 1 glass of wine or mixed drink every 2-3 months    Allergies[1]  Active Medications[2]  Immunization History  Administered Date(s) Administered    sv, Bivalent, Protein Subunit Rsvpref,pf [place]) 08/13/2024   Fluad Quad(high Dose 65+) 10/21/2019, 10/14/2020, 08/25/2021, 10/05/2022, 11/07/2023   Fluad  Trivalent(High Dose 65+) 11/07/2023   INFLUENZA, HIGH DOSE SEASONAL PF 09/04/2014, 09/01/2015, 10/27/2016, 10/05/2017, 10/10/2018, 10/21/2019, 10/14/2020, 08/25/2021   Influenza Inj Mdck Quad Pf 10/27/2016   Influenza Split 10/05/2011, 10/10/2012   Influenza, Mdck, Trivalent,PF 6+ MOS(egg free) 10/10/2024   Influenza-Unspecified 10/05/2011, 10/10/2012, 10/12/2013, 08/12/2014, 09/04/2014   PFIZER Comirnaty(Gray Top)Covid-19 Tri-Sucrose Vaccine 10/28/2023   PFIZER(Purple Top)SARS-COV-2 Vaccination 02/06/2020, 03/03/2020, 11/11/2020   Pneumococcal Conjugate-13 03/26/2015   Pneumococcal Polysaccharide-23 10/11/1999, 04/10/2012   Td 08/23/1999   Td (Adult),5 Lf Tetanus Toxid, Preservative Free 08/23/1999   Tdap 05/20/2019   Tetanus 05/28/2019, 05/28/2019   Zoster Recombinant(Shingrix) 03/11/2024, 05/14/2024   Zoster, Live 09/26/2008        Objective:     Vitals:   11/21/24 1545  BP: 136/60  Pulse: (!) 47  Temp: 97.9 F (36.6 C)  Height: 5' 7 (1.702 m)  Weight: 208 lb 12.8 oz (94.7 kg)  SpO2: 96%  TempSrc: Temporal  BMI (Calculated): 32.69     SpO2: 96  %  GENERAL: HEAD: Normocephalic, atraumatic.  EYES: Pupils equal, round, reactive to light.  No scleral icterus.  MOUTH:  NECK: Supple. No thyromegaly. Trachea midline. No JVD.  No adenopathy. PULMONARY: Good air entry bilaterally.  No adventitious sounds. CARDIOVASCULAR: S1 and S2. Regular rate and rhythm.  ABDOMEN: MUSCULOSKELETAL: No joint deformity, no clubbing, no edema.  NEUROLOGIC:  SKIN: Intact,warm,dry. PSYCH:        Assessment & Plan:   No diagnosis found.  No orders of the defined types were placed in this encounter.   No orders of the defined types were placed in this encounter.    Advised if symptoms do not improve or worsen, to please contact office for sooner follow up or seek emergency care.    I spent xxx minutes of dedicated to the care of this patient on the date of this encounter to include pre-visit review of records, face-to-face time with the patient discussing conditions above, post visit ordering of testing, clinical documentation with the electronic health record, making appropriate referrals as documented, and communicating necessary findings to members of the patients care team.   C. Leita Sanders, MD Advanced Bronchoscopy PCCM [facility]    *This note was dictated using voice recognition software/Dragon.  Despite best efforts to proofread, errors can occur which can change the meaning. Any transcriptional errors that result from this process are unintentional and may not be fully corrected at the time of dictation.     [1] Allergies Allergen Reactions   Anti-Inflammatory Enzyme [Nutritional Supplements] Other (See Comments)    REACTION: Due to bleeding ulcer   Cymbalta [Duloxetine Hcl] Other (See Comments)    REACTION: Urinary retention   Clarithromycin Other (See Comments)    Upsets stomach   Clarithromycin     GI upset   Donepezil  Hcl Other (See Comments)    Significant bradycardia causing syncope    Nsaids     GI BLEED   Ranitidine Diarrhea    severe diarrhea   Venlafaxine     severe depression  [2] Current Meds  Medication Sig   acetaminophen  (TYLENOL ) 500 MG tablet Take 2 tablets (1,000 mg total) by mouth every 6 (six) hours.   albuterol  (VENTOLIN  HFA) 108 (90 Base) MCG/ACT inhaler Inhale 1-2 puffs into the lungs every 6 (six) hours as needed for wheezing or shortness of breath.   amLODipine  (NORVASC ) 5 MG tablet TAKE 1 TABLET EVERY DAY   Calcium  Carb-Cholecalciferol  (CALCIUM  600+D3) 600-800 MG-UNIT TABS Take 1 tablet by  mouth daily.   citalopram  (CELEXA ) 40 MG tablet Take 40 mg by mouth daily.   ezetimibe  (ZETIA ) 10 MG tablet Take 1 tablet (10 mg total) by mouth daily.   fexofenadine (ALLEGRA) 180 MG tablet Take 180 mg by mouth daily.   LORazepam  (ATIVAN ) 0.5 MG tablet TAKE ONE TABLET (0.5 MG TOTAL) BY MOUTH DAILY AS NEEDED FOR ANXIETY.   Magnesium  Glycinate 100 MG CAPS Take 100 mg by mouth daily.   Menthol , Topical Analgesic, (BIOFREEZE EX) Apply 1 application. topically daily as needed (pain).   primidone  (MYSOLINE ) 50 MG tablet Take 50 mg by mouth at bedtime.   rivastigmine (EXELON) 1.5 MG capsule Take 1.5 mg by mouth 2 (two) times daily.   rosuvastatin  (CRESTOR ) 40 MG tablet Take 1 tablet (40 mg total) by mouth at bedtime.   traZODone  (DESYREL ) 100 MG tablet Take 200 mg by mouth at bedtime as needed.   trimethoprim  (TRIMPEX ) 100 MG tablet Take 100 mg by mouth daily.   venlafaxine (EFFEXOR) 75 MG tablet Take 75 mg by mouth daily.   vitamin B-12 (CYANOCOBALAMIN ) 1000 MCG tablet Take 1,000 mcg by mouth daily.

## 2024-11-21 NOTE — Patient Instructions (Signed)
 VISIT SUMMARY:  Today, we discussed your worsening shortness of breath over the past 5-6 months, particularly after sitting and then walking short distances. We also reviewed your current use of inhalers and your recent sleep disturbances.  YOUR PLAN:  -MILD PERSISTENT ASTHMA: Mild persistent asthma is a condition where the airways in your lungs are inflamed and can cause breathing difficulties. We have ordered a pulmonary function test to better understand your lung function. You should resume using your Breztri  inhaler daily as maintenance therapy and continue using your Ventolin  inhaler for acute episodes. We will follow up in 4-6 weeks to see how you are doing.  -EVALUATION FOR SLEEP-DISORDERED BREATHING: Sleep-disordered breathing refers to abnormal breathing patterns during sleep, which can affect your rest. Although your sleep disturbances are currently managed with psychiatric medication, we have ordered a sleep study to further evaluate your condition. We will review the results and follow up in 4-6 weeks.  INSTRUCTIONS:  Please schedule and complete the pulmonary function test and sleep study as soon as possible. Continue using your Breztri  inhaler daily and your Ventolin  inhaler as needed. We will see you again in 4-6 weeks for a follow-up appointment.

## 2024-12-03 ENCOUNTER — Ambulatory Visit
Admission: RE | Admit: 2024-12-03 | Discharge: 2024-12-03 | Disposition: A | Discharge status: Home / Self Care | Source: Ambulatory Visit | Attending: Pulmonary Disease | Admitting: Pulmonary Disease

## 2024-12-03 DIAGNOSIS — R0602 Shortness of breath: Secondary | ICD-10-CM | POA: Diagnosis present

## 2024-12-03 DIAGNOSIS — J986 Disorders of diaphragm: Secondary | ICD-10-CM | POA: Insufficient documentation

## 2024-12-24 ENCOUNTER — Ambulatory Visit: Admitting: Cardiology

## 2024-12-26 ENCOUNTER — Ambulatory Visit: Admitting: Pulmonary Disease

## 2024-12-26 ENCOUNTER — Encounter

## 2025-01-15 ENCOUNTER — Ambulatory Visit: Admitting: Cardiology

## 2025-01-15 ENCOUNTER — Encounter: Payer: Self-pay | Admitting: Cardiology

## 2025-01-15 VITALS — BP 110/62 | HR 87 | Ht 67.0 in | Wt 216.0 lb

## 2025-01-15 DIAGNOSIS — I493 Ventricular premature depolarization: Secondary | ICD-10-CM | POA: Diagnosis not present

## 2025-01-15 NOTE — Progress Notes (Signed)
 " Electrophysiology Office Note:   Date:  01/15/2025  ID:  Nancy Blackburn, Nancy Blackburn 15-Dec-1945, MRN [id number]  Primary Cardiologist: Redell Cave, MD Electrophysiologist: Fonda Kitty, MD      History of Present Illness:   Nancy Blackburn is a 79 y.o. female with h/o f CAD medically managed, frequent PVCs, symptomatic bradycardia with associated dizziness and syncope in 2021 on beta-blocker, Parkinson's disease with mild cognitive impairment, upper GI bleed secondary to PUD from NSAIDs in 2014 requiring multiple units of PRBC, HTN, HLD, and asthma who is being seen today for PVCs.  Discussed the use of AI scribe software for clinical note transcription with the patient, who gave verbal consent to proceed.  History of Present Illness Nancy Blackburn is a 79 year old female who presents for evaluation of heart rhythm abnormalities. She is accompanied by her daughter, Nancy Blackburn. She was referred for evaluation due to the results of a heart monitor showing extra beats.  She was found to have extra beats on an EKG, which led to the use of a heart monitor. She does not experience any symptoms such as skipping, fluttering, or palpitations and was unaware of the extra beats prior to the EKG and heart monitor findings. The patient reports that she was told her heart pumping function is normal. She was informed that the extra beats (PVCs) have been present since at least 2021.  She experiences shortness of breath, which she attributes to a pulmonary issue. She is under the care of a pulmonary doctor and is scheduled for a pulmonary function test on the 27th of this month. She has a history of asthma and is on breathing treatments. The shortness of breath occurs primarily with exertion.  She has been on metoprolol  in the past for blood pressure management but is not currently taking it. She recalls an incident where her heart rate and blood pressure were very low, leading to hospitalization,  maybe related to metoprolol  use. She is unsure of the exact medication that was stopped during that hospitalization.    Review of systems complete and found to be negative unless listed in HPI.   EP Information / Studies Reviewed:        Zio 08/16/24:  Conclusion Average heart rate 78, range 56-176 bpm. 10 episodes of nonsustained SVT, fastest interval 4 beats, max heart rate 176. Frequent PVCs, 22.1% burden. No atrial fibrillation or atrial flutter. No sustained arrhythmias.  Coronary CTA 10/03/24:  IMPRESSION: Coronary Arteries: Right dominant with no anomalies   LM: Calcified plaque ostial left main, mild (1-24%) stenosis.   LAD system: Mixed plaque proximal LAD, mild (1-24%) stenosis.   Circumflex system: No plaque or stenosis.   RCA system: Calcified plaque at RCA ostium, mild (1-24%) stenosis.  1. Coronary artery calcium  score 49.4 AU. This places the patient in the 42nd percentile for age and gender, suggesting low-intermediate risk for cardiac events.   2.  Mild nonobstructive CAD.   Echo 08/01/24:   1. Left ventricular ejection fraction, by estimation, is 60 to 65%. The  left ventricle has normal function. The left ventricle has no regional  wall motion abnormalities. Left ventricular diastolic parameters are  consistent with Grade I diastolic  dysfunction (impaired relaxation).   2. Right ventricular systolic function is normal. The right ventricular  size is normal. Tricuspid regurgitation signal is inadequate for assessing  PA pressure.   3. The mitral valve is normal in structure. No evidence of mitral valve  regurgitation. No evidence of  mitral stenosis.   4. The aortic valve is normal in structure. Aortic valve regurgitation is  not visualized. Aortic valve sclerosis is present, with no evidence of  aortic valve stenosis.   5. The inferior vena cava is normal in size with greater than 50%  respiratory variability, suggesting right atrial pressure of 3  mmHg.   6. PVCs noted    Physical Exam:   VS:  BP 110/62 (BP Location: Right Arm, Patient Position: Sitting, Cuff Size: Large)   Pulse 87   Ht 5' 7 (1.702 m)   Wt 216 lb (98 kg)   SpO2 96%   BMI 33.83 kg/m    Wt Readings from Last 3 Encounters:  01/15/25 216 lb (98 kg)  11/21/24 208 lb 12.8 oz (94.7 kg)  09/13/24 203 lb 9.6 oz (92.4 kg)     General: Well developed, in no acute distress.  Neck: No JVD.  Cardiac: Normal rate, irregular rhythm.  Resp: Normal work of breathing.  Ext: No edema.  Neuro: No gross focal deficits.  Psych: Normal affect.    ASSESSMENT AND PLAN:      #Frequent PVCs: LBBB in V1, V3 transition, left inferior axis. Likely outflow tract.  - She appears to be asymptomatic. She has some mild exertional dyspnea at times but reports abnormal PFTs and is seeing pulmonology. She has had high PVC burden for many years. Burden 25% in 2021. Despite this, her EF has remained normal. She has no obvious signs or symptoms of clinical heart failure.  - She is scheduled to follow up with pulmonary in the new future and repeat PFTs. If her PFTs are normal or her exertional dyspnea cannot be attributable to lung disease, then reasonable to trial anti-arrhythmic suppression of her PVCs with flecainide and see if this correlates with symptomatic improvement. If not, then likely not worth treating. We will have her return in a couple of months to rediscuss after her pulmonology visit.  Follow up with EP Team in 3 months  Signed, Fonda Kitty, MD  "

## 2025-01-15 NOTE — Patient Instructions (Signed)
 Medication Instructions:  Your physician recommends that you continue on your current medications as directed. Please refer to the Current Medication list given to you today.  *If you need a refill on your cardiac medications before your next appointment, please call your pharmacy*  Follow-Up: At [facility], you and your health needs are our priority.  As part of our continuing mission to provide you with exceptional heart care, our providers are all part of one team.  This team includes your primary Cardiologist (physician) and Advanced Practice Providers or APPs (Physician Assistants and Nurse Practitioners) who all work together to provide you with the care you need, when you need it.  Your next appointment:   3 months  Provider:   Sherie Don, NP

## 2025-02-07 ENCOUNTER — Encounter

## 2025-02-07 ENCOUNTER — Ambulatory Visit: Admitting: Pulmonary Disease

## 2025-04-15 ENCOUNTER — Ambulatory Visit: Admitting: Cardiology
